# Patient Record
Sex: Female | Born: 1950 | Race: Black or African American | Hispanic: No | Marital: Single | Smoking: Current every day smoker
Health system: Southern US, Community
[De-identification: ages and names within clinical notes are randomized; demographics above are authoritative.]

## PROBLEM LIST (undated history)

## (undated) ENCOUNTER — Emergency Department (HOSPITAL_COMMUNITY): Discharge status: Absent without leave | Payer: Medicare Other

## (undated) ENCOUNTER — Emergency Department (HOSPITAL_COMMUNITY): Payer: Medicare Other | Source: Home / Self Care

## (undated) DIAGNOSIS — J189 Pneumonia, unspecified organism: Secondary | ICD-10-CM

## (undated) DIAGNOSIS — R0602 Shortness of breath: Secondary | ICD-10-CM

## (undated) DIAGNOSIS — F32A Depression, unspecified: Secondary | ICD-10-CM

## (undated) DIAGNOSIS — F419 Anxiety disorder, unspecified: Secondary | ICD-10-CM

## (undated) DIAGNOSIS — F1910 Other psychoactive substance abuse, uncomplicated: Secondary | ICD-10-CM

## (undated) DIAGNOSIS — B20 Human immunodeficiency virus [HIV] disease: Secondary | ICD-10-CM

## (undated) DIAGNOSIS — Z21 Asymptomatic human immunodeficiency virus [HIV] infection status: Secondary | ICD-10-CM

## (undated) DIAGNOSIS — Z5900 Homelessness unspecified: Secondary | ICD-10-CM

## (undated) DIAGNOSIS — K219 Gastro-esophageal reflux disease without esophagitis: Secondary | ICD-10-CM

## (undated) DIAGNOSIS — E875 Hyperkalemia: Secondary | ICD-10-CM

## (undated) DIAGNOSIS — I509 Heart failure, unspecified: Secondary | ICD-10-CM

## (undated) DIAGNOSIS — I209 Angina pectoris, unspecified: Secondary | ICD-10-CM

## (undated) DIAGNOSIS — Z992 Dependence on renal dialysis: Secondary | ICD-10-CM

## (undated) DIAGNOSIS — D649 Anemia, unspecified: Secondary | ICD-10-CM

## (undated) DIAGNOSIS — N289 Disorder of kidney and ureter, unspecified: Secondary | ICD-10-CM

## (undated) DIAGNOSIS — N186 End stage renal disease: Secondary | ICD-10-CM

## (undated) DIAGNOSIS — M1990 Unspecified osteoarthritis, unspecified site: Secondary | ICD-10-CM

## (undated) DIAGNOSIS — F329 Major depressive disorder, single episode, unspecified: Secondary | ICD-10-CM

## (undated) DIAGNOSIS — I10 Essential (primary) hypertension: Secondary | ICD-10-CM

## (undated) HISTORY — PX: LAPAROTOMY: SHX154

## (undated) HISTORY — PX: OTHER SURGICAL HISTORY: SHX169

## (undated) HISTORY — PX: BREAST BIOPSY: SHX20

## (undated) HISTORY — PX: ARTERIOVENOUS GRAFT PLACEMENT: SUR1029

## (undated) HISTORY — PX: CARDIAC CATHETERIZATION: SHX172

## (undated) HISTORY — PX: AV FISTULA PLACEMENT: SHX1204

---

## 2000-06-19 ENCOUNTER — Inpatient Hospital Stay (HOSPITAL_COMMUNITY): Admission: EM | Admit: 2000-06-19 | Discharge: 2000-06-23 | Payer: Self-pay | Admitting: *Deleted

## 2002-03-18 ENCOUNTER — Inpatient Hospital Stay (HOSPITAL_COMMUNITY): Admission: EM | Admit: 2002-03-18 | Discharge: 2002-03-22 | Payer: Self-pay | Admitting: Psychiatry

## 2002-05-27 ENCOUNTER — Other Ambulatory Visit: Admission: RE | Admit: 2002-05-27 | Discharge: 2002-05-27 | Payer: Self-pay | Admitting: Internal Medicine

## 2010-08-02 DIAGNOSIS — F1911 Other psychoactive substance abuse, in remission: Secondary | ICD-10-CM | POA: Insufficient documentation

## 2010-08-30 ENCOUNTER — Emergency Department (HOSPITAL_COMMUNITY): Admission: EM | Admit: 2010-08-30 | Discharge: 2010-08-30 | Payer: Self-pay | Admitting: Emergency Medicine

## 2010-10-30 ENCOUNTER — Encounter: Payer: Self-pay | Admitting: Nephrology

## 2011-02-24 NOTE — H&P (Signed)
[facility]  Patient:    [name] Visit Number: [id number] MRN: [id number]          Service Type: PSY Location: 500 0500 02 Attending Physician:  Rachael Fee Dictated by:   Young Berry Scott, R.N. N.P. Admit Date:  03/18/2002                     Psychiatric Admission Assessment  DATE OF ADMISSION:  March 18, 2002  DATE OF ASSESSMENT:  March 19, 2002, at 9:30 a.m.  PATIENT IDENTIFICATION:  This is a 60 year old African-American female who is a voluntary admission.  HISTORY OF PRESENT ILLNESS:  This patient presented in the emergency room depressed with suicidal thoughts and a plan to walk into traffic.  She reported that she was detoxified from alcohol here at [facility] approximately two years ago and used occasionally since that time but generally had been doing well in her life.  Then approximately two months ago, she began using alcohol daily, drinking three to four 40 ounce beers a day, up to that much in an escalating pattern, and also began using cocaine, using cocaine up to daily when she had money available.  She denies any auditory or visual hallucinations today, denies any homicidal ideation. She is drowsy and irritable and generally was uncooperative in the emergency room and is resistant to exam today.  Because the patient is resistant to interview, the bulk of this history is taken from the record.  PAST PSYCHIATRIC HISTORY:  The patient has no current psychiatric treatment. This is her second [facility] admission.  She has one prior admission in 2001 for detoxification and treatment of depression.  Her longest period clean and sober is unclear.  She has apparently been using alcohol for the past 20 years with most recent use pattern noted above, and has been using cocaine in various amounts with first use approximately 20 years ago and has been using in an escalating pattern up to $100-200 worth a  day.  SUBSTANCE ABUSE HISTORY:  Already noted above.  PAST MEDICAL HISTORY:  The patient is followed by [facility] in [facility].  Medical problems include diabetes mellitus type 2, hypertension, and history of arthritis primarily in her legs.  She does state today that she has been having unprotected sex, although how much is unclear but this is in conjunction with drug use.  MEDICATIONS: 1. Glucovance 2.5 mg/500 mg one tablet b.i.d. 2. Omeprazole 20 mg daily. 3. Lotrel 5 mg daily.  DRUG ALLERGIES:  AMOXICILLIN.  PHYSICAL EXAMINATION:  GENERAL:  The patients physical examination was done at the [facility] and is essentially unremarkable.  The patients CBG was 98 this morning.  VITAL SIGNS:  On admission to the unit, temperature 97.9, pulse 100, respirations 18, blood pressure 140/78.  LABORATORY DATA:  Urine drug screen was positive for cocaine.  BMET is remarkable for elevated chloride 110, bicarbonate 20, potassium 5.0, sodium 139, BUN 27, creatinine 1.5.  Alcohol level was less than 0.01.  CBC revealed normal WBC 8.8, RBC 3.79, hemoglobin 12.7, hematocrit 35.2, MCV 93, MCH 33.4, platelets 421, MPV 6.9.  Thyroid panel is currently pending as is her routine urinalysis.  SOCIAL HISTORY:  The patient is currently homeless and reported in the emergency room that she had lost everything because of her drug habit.  Her legal issues are unclear as are her social supports.  She does state that she has been "living from  house to house."  FAMILY HISTORY:  Unclear.  MENTAL STATUS EXAMINATION:  This is an overweight, large built, African-American female who is in bed with her eyes closed.  She is awake but says she does not want to talk, "Im too weak and sleepy."  She is resistant to interview and generally uncooperative, turns her face away from me.  Speech is normal and relevant.  Motor activity appears generally normal.  Mood is irritable.  Does seem to  have some embarrassment and shame component to her overall mood.  Thought process is logical and goal directed.  She is vaguely positive for suicidal ideation without a specific intent at this time and is able to contract for safety on the unit.  No homicidal ideation or auditory or visual hallucinations, no evidence of paranoia.  Cognitive: Intact and oriented x 3.  She is a poor historian.  Intelligence is average.  Judgment is poor.  Insight: Fair.  Impulse control: Poor.  ADMISSION DIAGNOSES: Axis I:    1. Rule out major depression, recurrent, severe.            2. Ethyl alcohol abuse, rule out dependence.            3. Cocaine abuse, rule out dependence. Axis II:   Deferred. Axis III:  1. Diabetes mellitus type 2.            2. Arthritis.            3. Rule out sexually transmitted disease. Axis IV:   Moderate to severe problems related to the social environment with            her use of drugs as a coping mechanism and severe problems with            housing with the patient being homeless. Axis V:    Current 40, past year 73.  INITIAL PLAN OF CARE:  Plan is to voluntarily admit the patient to detoxify her from alcohol and cocaine and to treat her depression symptoms.  Our goal is a safe detoxification in five days and to alleviate her suicidal ideation. We have elected to start her on a Librium protocol and will also give her Gatorade one bottle t.i.d. for the next 48 hours to get her hydrated.  We have also elected to start her on Symmetrel 100 mg p.o. b.i.d. and trazodone 50 mg h.s. p.r.n. for insomnia.  For her Axis III disorders, we will rule out sexually transmitted diseases by getting an RPR and HIV test on her along with a urine screen for GC and chlamydia and will get a urine pregnancy test, too, just to be on the safe side since we are unable to get a clear history on her menses pattern.  We will resume her routine diabetes medications and do CBGs a.c. every morning  for breakfast.  We will ask the case manager to do some further assessment as soon as the patient makes some progress with  detoxification regarding the patients family and support situation and if she has any legal issues that we need to help her with.  ESTIMATED LENGTH OF STAY:  Six days. Dictated by:   Young Berry Scott, R.N. N.P. Attending Physician:  Rachael Fee DD:  03/19/02 TD:  03/19/02 Job: [id number]

## 2011-02-24 NOTE — H&P (Signed)
[facility]  Patient:    [name], [name]                      MRN: [id number] Adm. Date:  [id number] Attending:  Otilio Saber Dictator:   Candi Leash. Orsini, N.P.                         History and Physical  ADDENDUM  REASON FOR ADMISSION AND SYMPTOMS:  She has not been sleeping well, averaging just a few hours per night and has had very little appetite for the past several days prior to admission. DD:  06/20/00 TD:  06/21/00 Job: [id number]

## 2011-02-24 NOTE — Discharge Summary (Signed)
[facility]  Patient:    [name], [name] Visit Number: [id number] MRN: [id number]          Service Type: PSY Location: 500 0500 02 Attending Physician:  Rachael Fee Dictated by:   Reymundo Poll Dub Mikes, M.D. Admit Date:  03/18/2002 Discharge Date: 03/22/2002                             Discharge Summary  CHIEFCOMPLAINT AND PRESENT ILLNESS:  This was the second admission to [facility] for this 51 year oldfemale voluntarilyadmitted.  She went to the emergency room depressed with suicidal thoughts and a plan to walk into traffic.  She was detoxified from alcohol two years prior to this admission.  Two months prior to this admission, she began using alcohol daily, drinking 3-4 40-ounce beers a day with a escalating pattern.  Started using cocaine daily when money is available.  No auditory or visual hallucinations. No homicidal ideation.  Drowsy and irritable upon initialevaluation.  PAST PSYCHIATRIC HISTORY:  No previous treatment.  No current treatment. [facility]. Longest clean period is unclear.  ALCOHOL/DRUG HISTORY:  As already stated.  Alcohol andhas gotten into using cocaine.  MEDICAL HISTORY:  Diabetes mellitus, type 2, hypertension, arthritis, also has been having unprotected sex.  MEDICATIONS:  Glucovance 2.5-500 mg, 1 twice aday, omeprazole 20 mg daily, Lotrel 5 mg daily.  PHYSICAL EXAMINATION:  Performed andfailed to show any acute findings.  MENTAL STATUS EXAMINATION:  Overweight, large-built, African-American female in bed with eyes closed.  Claimed that too weak and sleepy to talk initially. Resistant to the interview.  Uncooperative.  Speech was normal and relevant. Motor activity normal.  Mood irritable.  Claimed that she was ______________. Thought process logical and goal directed, vaguely positive for suicidal ideation.  Nointent.  Can contract for safety.  Cognition  well-preserved.  ADMISSION DIAGNOSES: Axis I:    1. Major depression, recurrent.            2. Alcohol dependence.            3. Cocaine abuse. Axis II:   No diagnosis. Axis III:  1. Diabetes mellitus, type 2.          2. Arthritis. Axis IV:   Moderate. Axis V:    Global Assessment of Functioning uponadmission 40; highest Global         Assessment ofFunctioning in the lastyear 60.  LABORATORY DATA:  Thyroid profile was within normal limits. Urine pregnancy test was negative.  RPR was nonreactive.  Chlamydia probe was negative.  HIV test was reactive, was confirmed HIV, 1 AB, confirmed positive.  HOSPITAL COURSE:  The patientwas admitted and initially was detoxed using Librium.  She was kept on her usual medications.  She started stabilizing, slept better.  On March 21, 2002, she was wanting to go home.  She stayed up until March 22, 2002.  She was wanting to be discharged.  She denied any suicidal or homicidal ideation.  Claimed that she did not want to go back to using.  She was made aware of her HIV status.  She was in full contact with reality.  She wanted to be discharged and take care of this herself through her primary physician.  Initially, a family member did not want to take her but she called some friends that wanted to take her.  As she was not suicidal or homicidal, she was fully detoxed and  in full contact with reality, she was discharged and she was recommended to contact her primary physician first thing.  She was given copies of the labs for follow-up of the tests.  DISCHARGE DIAGNOSES: Axis I:    1. Major depression, recurrent.       2. Alcohol dependence.            3. Cocaineabuse. Axis II:   No diagnosis. Axis III:  1.Diabetes mellitus, type2.            2. Arthritis. Axis IV:   Moderate. Axis V:    Global Assessment of Functioning upon discharge 55.  DISCHARGE MEDICATIONS: 1. Glucovance 2.5-500 mg, 1 twice a day. 2. Lotrel 5-20 mg, 1 daily. 3. Prilosec 20  mg daily. 4. Clarinex 5 mg daily. 5. Zoloft 50 mgdaily.  FOLLOW-UP: Primary physician. Dictated by:   Reymundo Poll Dub Mikes, M.D. Attending Physician:  Rachael Fee DD:  04/30/02 TD:  04/30/02 Job: [id number]

## 2011-02-24 NOTE — Discharge Summary (Signed)
[facility]  Patient:    [name], [name]                      MRN: [id number] Adm. Date:  [id number] Disc. Date: [id number] Attending:  Otilio Saber Dictator:   Landry Corporal, NP                           Discharge Summary  HISTORY OF PRESENT ILLNESS:  [name] is a 60 year old black female that was voluntarily admitted to [facility] for alcohol rehabilitation and cocaine abuse.  She had gone to [facility] voluntarily wanting to get off the street drugs and was also having thoughts of suicide.  She also had not been sleep well, averaging just a few hours per night and has had very little appetite for the past several days prior to admission.  She has a history of alcohol abuse and smoking cocaine for approximately 10 years.  She has had no previous psychiatric hospitalizations, no prior rehab or detox.  PAST MEDICAL HISTORY:  Her primary care physician is Alberteen Spindle, for her diabetes mellitus, hypertension and arthritis. Her admission medications will include Glucotrol 5 mg b.i.d. p.o.  PHYSICAL EXAMINATION:  Physical examination was done at [facility] with significant findings of blood pressure of 132/104, blood sugars of 388 with positive cocaine in the urine drug screen.  MENTAL STATUS EXAMINATION:  Patient was sleeping in a bed upon interview, somewhat lethargic.  She falls asleep when she is not disturbed.  Her appearance and behavior:  She is in bed with hospital garb.  She is cooperative and is dressed in [employer].  Her speech is normal and relevant, somewhat slow.  Her mood is depressed, her affect is depressed.  She has little eye contact.  Her thought processes are logical.  No evidence of psychosis, no hallucinations, no delusions, no flight of ideas.  Her reliability is uncertain.  No suicidal ideation today, although she was having thoughts of suicidal on September 11.  Cognitively, she is oriented x  3.  Her judgment is poor.  Insight is fair.  Average Intelligence, with decreased concentration.  She is also a poor historian.  ADMITTING DIAGNOSES: Axis I:     1. Major depression.             2. Substance abuse for alcohol and cocaine. Axis II:    None. Axis III:   1. Non-insulin-dependent diabetes mellitus.             2. Hypertension.             3. Arthritis in her right leg. Axis IV:    Severe, related to problems with primary support group, social             environment, housing problems, legal system, and medical problems. Axis V:     Current global assessment of function 40, highest past year 45. DD:  07/12/00 TD:  07/12/00 Job: [id number]

## 2011-02-24 NOTE — H&P (Signed)
[facility]  Patient:    [name], [name]                      MRN: [id number] Adm. Date:  [id number] Attending:  Otilio Saber Dictator:   Candi Leash. Orsini, N.P.                         History and Physical  IDENTIFYING INFORMATION:  This is a 60 year old black female voluntarily admitted to [facility] for alcohol rehabilitation and cocaine abuse. She had gone to [facility] voluntarily wanting to get off these street drugs.  She was having thoughts of suicide yesterday., somewhat vague about what she wanted to do but she did say at one time she felt like she wanted to step in front of a car and was then transferred to [facility] for admission.  She has a history of alcohol abuse and cocaine smoking for approximately 10 years.  PAST PSYCHIATRIC HISTORY:  She has had no previous psychiatric hospitalization, no prior rehabilitation or detox and has not seen a psychiatrist or a therapist for any kind of psychiatric problems.  SOCIAL HISTORY:  She is a 60 year old divorced.  Her ex-husband just passed away.  She has five grown children.  [place]  She is on disability.  She completed the ninth grade.  She is a nonsmoker.  Her sister, Milagro Belmares is supportive of her.  She is on probation for a DUI.  FAMILY HISTORY:  Sister and niece have reports of suicide attempts.  ALCOHOL AND DRUG HISTORY:  She states she has been drinking about a fifth a day of alcohol for quite a long time.  Again, her time frame is vague.  She has been smoking cocaine for about 10 years.  She reports about a $1000 a week habit and says that people just bring this to her on sort of a routine basis.  PAST MEDICAL HISTORY:  Her primary care Dyneisha Murchison is Dr. Oneita Hurt in the [facility] in [place].  Her medical problems include non-insulin-dependent diabetes mellitus for 10 years, hypertension, arthritis is her right leg.  She says her blood  sugars have been running around 300 for quite a long time.  MEDICATIONS:  Again, she is somewhat unsure, but she is is on Glucotrol 10 mg q.d. and a blood pressure pill.  She was unable to tell me what that medication was.  ALLERGIES:  No known drug allergies.  POSITIVE PHYSICAL FINDINGS:  Physical examination was done at [facility].  Physical findings include a blood pressure of 132/104, blood sugar of 388 with positive cocaine in her urine drug screen.  MENTAL STATUS EXAMINATION:  She was sleeping in bed upon interview, somewhat lethargic.  She falls asleep like she is not disturbed.  Her appearance and behavior - She is in bed.  She is cooperative and she is dressed in hospital garb.  Her speech is normal and relevant, somewhat slow.  Her mood is depressed.  Her affect is depressed.  She has little eye contact.  Her thought processes are logical, no evidence of psychosis, no hallucinations, no delusions, no flight of ideas.  Her reliability is uncertain.  No suicidal ideation today, although she was kind of planning that on June 19, 2000.  Cognitive, she is oriented x 3.  Her judgment is poor insight is fair, average intelligence and decreased concentration.  She is also a poor  historian.  DIAGNOSES: Axis I:    1. Major depression.            2. Substance abuse for alcohol and cocaine. Axis II:   None. Axis III:  1. Non-insulin-dependent diabetes mellitus.            2. Hypertension.            3. Arthritis in her right leg. Axis IV:   Severe in regard to her problems with primary support group, social            environment, housing problems, legal system and medical problems.  PLAN:  Voluntary admission to [facility] for alcohol rehabilitation and cocaine abuse.  We will obtain her records from [facility] in [place].  We will monitor her blood sugars and place her on insulin sensitive sliding scale.  MEDICATIONS:  Glucotrol 5 mg p.o.  b.i.d., Librium 25 mg p.o. q.4h. p.r.n., thiamine 100 mg IM x 1 today, thiamine 100 mg p.o. q.d.  Will add a multivitamin 1 p.o. q.d. and the insulin sensitive sliding scale - routine orders.  TENTATIVE LENGTH OF STAY AND DISCHARGE PLANS:  3-5 days.  DD:  06/20/00 TD:  06/20/00 Job: [id number]

## 2012-03-12 ENCOUNTER — Emergency Department: Payer: Self-pay | Admitting: Emergency Medicine

## 2012-03-12 LAB — BASIC METABOLIC PANEL
Anion Gap: 12 (ref 7–16)
Co2: 21 mmol/L (ref 21–32)
Creatinine: 11.38 mg/dL — ABNORMAL HIGH (ref 0.60–1.30)
EGFR (African American): 4 — ABNORMAL LOW
EGFR (Non-African Amer.): 3 — ABNORMAL LOW
Glucose: 77 mg/dL (ref 65–99)
Osmolality: 297 (ref 275–301)
Potassium: 5.6 mmol/L — ABNORMAL HIGH (ref 3.5–5.1)
Sodium: 139 mmol/L (ref 136–145)

## 2012-03-13 ENCOUNTER — Inpatient Hospital Stay: Payer: Self-pay | Admitting: Internal Medicine

## 2012-03-13 LAB — TSH: Thyroid Stimulating Horm: 0.41 u[IU]/mL — ABNORMAL LOW

## 2012-03-13 LAB — COMPREHENSIVE METABOLIC PANEL
BUN: 80 mg/dL — ABNORMAL HIGH (ref 7–18)
Bilirubin,Total: 0.4 mg/dL (ref 0.2–1.0)
EGFR (African American): 3 — ABNORMAL LOW
Glucose: 132 mg/dL — ABNORMAL HIGH (ref 65–99)
Osmolality: 305 (ref 275–301)
SGPT (ALT): 60 U/L
Sodium: 140 mmol/L (ref 136–145)
Total Protein: 7.9 g/dL (ref 6.4–8.2)

## 2012-03-13 LAB — URINALYSIS, COMPLETE
Blood: NEGATIVE
Glucose,UR: 150 mg/dL (ref 0–75)
Ph: 9 (ref 4.5–8.0)
RBC,UR: NONE SEEN /HPF (ref 0–5)

## 2012-03-13 LAB — DRUG SCREEN, URINE
Barbiturates, Ur Screen: NEGATIVE (ref ?–200)
Cocaine Metabolite,Ur ~~LOC~~: POSITIVE (ref ?–300)
MDMA (Ecstasy)Ur Screen: NEGATIVE (ref ?–500)
Tricyclic, Ur Screen: NEGATIVE (ref ?–1000)

## 2012-03-13 LAB — CK TOTAL AND CKMB (NOT AT ARMC)
CK, Total: 104 U/L (ref 21–215)
CK-MB: 0.9 ng/mL (ref 0.5–3.6)

## 2012-03-13 LAB — CBC
HGB: 11 g/dL — ABNORMAL LOW (ref 12.0–16.0)
MCV: 106 fL — ABNORMAL HIGH (ref 80–100)
Platelet: 226 10*3/uL (ref 150–440)
RBC: 3.2 10*6/uL — ABNORMAL LOW (ref 3.80–5.20)
RDW: 16.4 % — ABNORMAL HIGH (ref 11.5–14.5)
WBC: 8.4 10*3/uL (ref 3.6–11.0)

## 2012-03-13 LAB — PRO B NATRIURETIC PEPTIDE: B-Type Natriuretic Peptide: 35626 pg/mL — ABNORMAL HIGH (ref 0–125)

## 2012-03-14 DIAGNOSIS — I059 Rheumatic mitral valve disease, unspecified: Secondary | ICD-10-CM

## 2012-03-14 LAB — COMPREHENSIVE METABOLIC PANEL
Albumin: 3.4 g/dL (ref 3.4–5.0)
Anion Gap: 15 (ref 7–16)
BUN: 88 mg/dL — ABNORMAL HIGH (ref 7–18)
Bilirubin,Total: 0.5 mg/dL (ref 0.2–1.0)
Chloride: 105 mmol/L (ref 98–107)
Co2: 17 mmol/L — ABNORMAL LOW (ref 21–32)
EGFR (African American): 3 — ABNORMAL LOW
EGFR (Non-African Amer.): 3 — ABNORMAL LOW
Glucose: 140 mg/dL — ABNORMAL HIGH (ref 65–99)
Osmolality: 303 (ref 275–301)
Potassium: 5.4 mmol/L — ABNORMAL HIGH (ref 3.5–5.1)
Sodium: 137 mmol/L (ref 136–145)
Total Protein: 7.5 g/dL (ref 6.4–8.2)

## 2012-03-14 LAB — T4, FREE: Free Thyroxine: 0.88 ng/dL (ref 0.76–1.46)

## 2012-03-14 LAB — CBC WITH DIFFERENTIAL/PLATELET
Basophil #: 0.1 10*3/uL (ref 0.0–0.1)
Basophil %: 1 %
Eosinophil #: 0.1 10*3/uL (ref 0.0–0.7)
HGB: 9.9 g/dL — ABNORMAL LOW (ref 12.0–16.0)
Lymphocyte #: 2.7 10*3/uL (ref 1.0–3.6)
Monocyte %: 11.1 %
Neutrophil #: 3.7 10*3/uL (ref 1.4–6.5)
Neutrophil %: 49.7 %
Platelet: 186 10*3/uL (ref 150–440)
RBC: 2.85 10*6/uL — ABNORMAL LOW (ref 3.80–5.20)
WBC: 7.4 10*3/uL (ref 3.6–11.0)

## 2012-03-14 LAB — RENAL FUNCTION PANEL
Albumin: 3.3 g/dL — ABNORMAL LOW (ref 3.4–5.0)
Calcium, Total: 8.8 mg/dL (ref 8.5–10.1)
Chloride: 105 mmol/L (ref 98–107)
EGFR (African American): 3 — ABNORMAL LOW
Glucose: 141 mg/dL — ABNORMAL HIGH (ref 65–99)
Potassium: 5.4 mmol/L — ABNORMAL HIGH (ref 3.5–5.1)

## 2012-03-14 LAB — TROPONIN I: Troponin-I: <0.02 ng/mL

## 2012-03-14 LAB — LIPID PANEL
Cholesterol: 136 mg/dL (ref 0–200)
HDL Cholesterol: 36 mg/dL — ABNORMAL LOW (ref 40–60)
Triglycerides: 159 mg/dL (ref 0–200)
VLDL Cholesterol, Calc: 32 mg/dL (ref 5–40)

## 2012-03-14 LAB — FOLATE: Folic Acid: 16.1 ng/mL (ref 3.1–100.0)

## 2012-03-14 LAB — CK TOTAL AND CKMB (NOT AT ARMC)
CK, Total: 90 U/L (ref 21–215)
CK-MB: 0.9 ng/mL (ref 0.5–3.6)

## 2012-03-14 LAB — MAGNESIUM: Magnesium: 2.2 mg/dL

## 2012-03-15 LAB — HEMOGLOBIN A1C: Hemoglobin A1C: 5 % (ref 4.2–6.3)

## 2012-05-06 ENCOUNTER — Inpatient Hospital Stay: Payer: Self-pay | Admitting: Internal Medicine

## 2012-05-06 LAB — CBC
HGB: 9.9 g/dL — ABNORMAL LOW (ref 12.0–16.0)
MCH: 35.9 pg — ABNORMAL HIGH (ref 26.0–34.0)
MCV: 108 fL — ABNORMAL HIGH (ref 80–100)
Platelet: 235 10*3/uL (ref 150–440)
RBC: 2.77 10*6/uL — ABNORMAL LOW (ref 3.80–5.20)
RDW: 17 % — ABNORMAL HIGH (ref 11.5–14.5)
WBC: 9.4 10*3/uL (ref 3.6–11.0)

## 2012-05-06 LAB — COMPREHENSIVE METABOLIC PANEL
Alkaline Phosphatase: 420 U/L — ABNORMAL HIGH (ref 50–136)
Anion Gap: 16 (ref 7–16)
BUN: 134 mg/dL — ABNORMAL HIGH (ref 7–18)
Calcium, Total: 6.7 mg/dL — CL (ref 8.5–10.1)
Chloride: 105 mmol/L (ref 98–107)
Co2: 15 mmol/L — ABNORMAL LOW (ref 21–32)
EGFR (African American): 3 — ABNORMAL LOW
EGFR (Non-African Amer.): 3 — ABNORMAL LOW
Glucose: 86 mg/dL (ref 65–99)
Potassium: 6.6 mmol/L (ref 3.5–5.1)
SGOT(AST): 119 U/L — ABNORMAL HIGH (ref 15–37)
SGPT (ALT): 72 U/L
Total Protein: 7.6 g/dL (ref 6.4–8.2)

## 2012-05-06 LAB — CK TOTAL AND CKMB (NOT AT ARMC): CK-MB: 1.6 ng/mL (ref 0.5–3.6)

## 2012-05-06 LAB — PHOSPHORUS: Phosphorus: 6.2 mg/dL — ABNORMAL HIGH (ref 2.5–4.9)

## 2012-05-06 LAB — BASIC METABOLIC PANEL
Calcium, Total: 7.2 mg/dL — ABNORMAL LOW (ref 8.5–10.1)
Chloride: 105 mmol/L (ref 98–107)
Osmolality: 317 (ref 275–301)
Potassium: 5 mmol/L (ref 3.5–5.1)
Sodium: 137 mmol/L (ref 136–145)

## 2012-05-06 LAB — TROPONIN I: Troponin-I: <0.02 ng/mL

## 2012-05-10 ENCOUNTER — Encounter (HOSPITAL_COMMUNITY): Payer: Self-pay | Admitting: Emergency Medicine

## 2012-05-10 ENCOUNTER — Observation Stay (HOSPITAL_COMMUNITY)
Admission: EM | Admit: 2012-05-10 | Discharge: 2012-05-14 | Disposition: A | Discharge status: Home / Self Care | Payer: Medicare Other | Attending: Nephrology | Admitting: Nephrology

## 2012-05-10 ENCOUNTER — Inpatient Hospital Stay (HOSPITAL_COMMUNITY): Payer: Medicare Other

## 2012-05-10 DIAGNOSIS — F17200 Nicotine dependence, unspecified, uncomplicated: Secondary | ICD-10-CM | POA: Insufficient documentation

## 2012-05-10 DIAGNOSIS — F1210 Cannabis abuse, uncomplicated: Secondary | ICD-10-CM | POA: Insufficient documentation

## 2012-05-10 DIAGNOSIS — Z992 Dependence on renal dialysis: Secondary | ICD-10-CM | POA: Diagnosis present

## 2012-05-10 DIAGNOSIS — I509 Heart failure, unspecified: Secondary | ICD-10-CM | POA: Insufficient documentation

## 2012-05-10 DIAGNOSIS — Z79899 Other long term (current) drug therapy: Secondary | ICD-10-CM | POA: Insufficient documentation

## 2012-05-10 DIAGNOSIS — D631 Anemia in chronic kidney disease: Secondary | ICD-10-CM | POA: Insufficient documentation

## 2012-05-10 DIAGNOSIS — N189 Chronic kidney disease, unspecified: Secondary | ICD-10-CM | POA: Insufficient documentation

## 2012-05-10 DIAGNOSIS — E119 Type 2 diabetes mellitus without complications: Secondary | ICD-10-CM | POA: Insufficient documentation

## 2012-05-10 DIAGNOSIS — Z9119 Patient's noncompliance with other medical treatment and regimen: Secondary | ICD-10-CM | POA: Insufficient documentation

## 2012-05-10 DIAGNOSIS — N2581 Secondary hyperparathyroidism of renal origin: Secondary | ICD-10-CM | POA: Insufficient documentation

## 2012-05-10 DIAGNOSIS — B20 Human immunodeficiency virus [HIV] disease: Secondary | ICD-10-CM | POA: Diagnosis present

## 2012-05-10 DIAGNOSIS — F1410 Cocaine abuse, uncomplicated: Secondary | ICD-10-CM | POA: Insufficient documentation

## 2012-05-10 DIAGNOSIS — Z720 Tobacco use: Secondary | ICD-10-CM | POA: Diagnosis present

## 2012-05-10 DIAGNOSIS — I120 Hypertensive chronic kidney disease with stage 5 chronic kidney disease or end stage renal disease: Secondary | ICD-10-CM | POA: Insufficient documentation

## 2012-05-10 DIAGNOSIS — Z91199 Patient's noncompliance with other medical treatment and regimen due to unspecified reason: Secondary | ICD-10-CM | POA: Insufficient documentation

## 2012-05-10 DIAGNOSIS — Z21 Asymptomatic human immunodeficiency virus [HIV] infection status: Secondary | ICD-10-CM | POA: Insufficient documentation

## 2012-05-10 DIAGNOSIS — M25569 Pain in unspecified knee: Secondary | ICD-10-CM | POA: Insufficient documentation

## 2012-05-10 DIAGNOSIS — I10 Essential (primary) hypertension: Secondary | ICD-10-CM | POA: Diagnosis present

## 2012-05-10 DIAGNOSIS — D649 Anemia, unspecified: Secondary | ICD-10-CM

## 2012-05-10 DIAGNOSIS — N186 End stage renal disease: Secondary | ICD-10-CM | POA: Insufficient documentation

## 2012-05-10 HISTORY — DX: Human immunodeficiency virus (HIV) disease: B20

## 2012-05-10 HISTORY — DX: Asymptomatic human immunodeficiency virus (hiv) infection status: Z21

## 2012-05-10 HISTORY — DX: Heart failure, unspecified: I50.9

## 2012-05-10 HISTORY — DX: Essential (primary) hypertension: I10

## 2012-05-10 LAB — COMPREHENSIVE METABOLIC PANEL
AST: 16 U/L (ref 0–37)
CO2: 23 mEq/L (ref 19–32)
GFR calc Af Amer: 4 mL/min — ABNORMAL LOW (ref >90)
GFR calc non Af Amer: 3 mL/min — ABNORMAL LOW (ref >90)
Glucose, Bld: 91 mg/dL (ref 70–99)
Potassium: 4 mEq/L (ref 3.5–5.1)
Total Protein: 7.5 g/dL (ref 6.0–8.3)

## 2012-05-10 LAB — CBC WITH DIFFERENTIAL/PLATELET
Basophils Relative: 0 % (ref 0–1)
Hemoglobin: 9.5 g/dL — ABNORMAL LOW (ref 12.0–15.0)
Lymphocytes Relative: 39 % (ref 12–46)
MCH: 34.2 pg — ABNORMAL HIGH (ref 26.0–34.0)
Monocytes Absolute: 0.7 10*3/uL (ref 0.1–1.0)
Monocytes Relative: 8 % (ref 3–12)
Neutro Abs: 4.7 10*3/uL (ref 1.7–7.7)
Neutrophils Relative %: 52 % (ref 43–77)
RDW: 15.6 % — ABNORMAL HIGH (ref 11.5–15.5)

## 2012-05-10 MED ORDER — PANTOPRAZOLE SODIUM 40 MG PO TBEC
40.0000 mg | DELAYED_RELEASE_TABLET | Freq: Every day | ORAL | Status: DC
  Administered 2012-05-11 – 2012-05-13 (×3): 40 mg via ORAL
  Filled 2012-05-10 (×3): qty 1

## 2012-05-10 MED ORDER — LAMIVUDINE 150 MG PO TABS
150.0000 mg | ORAL_TABLET | Freq: Every day | ORAL | Status: DC
  Administered 2012-05-11 – 2012-05-12 (×2): 150 mg via ORAL
  Filled 2012-05-10 (×3): qty 1

## 2012-05-10 MED ORDER — SODIUM CHLORIDE 0.9 % IV SOLN
62.5000 mg | INTRAVENOUS | Status: DC
  Filled 2012-05-10: qty 5

## 2012-05-10 MED ORDER — CARVEDILOL 6.25 MG PO TABS
6.2500 mg | ORAL_TABLET | Freq: Two times a day (BID) | ORAL | Status: DC
  Administered 2012-05-11 – 2012-05-13 (×5): 6.25 mg via ORAL
  Filled 2012-05-10 (×10): qty 1

## 2012-05-10 MED ORDER — ACETAMINOPHEN 500 MG PO TABS
1000.0000 mg | ORAL_TABLET | Freq: Four times a day (QID) | ORAL | Status: DC | PRN
  Administered 2012-05-10 – 2012-05-11 (×2): 1000 mg via ORAL
  Filled 2012-05-10 (×3): qty 2

## 2012-05-10 MED ORDER — HYDRALAZINE HCL 50 MG PO TABS
100.0000 mg | ORAL_TABLET | Freq: Three times a day (TID) | ORAL | Status: DC
  Administered 2012-05-10 – 2012-05-12 (×4): 100 mg via ORAL
  Filled 2012-05-10 (×7): qty 2

## 2012-05-10 MED ORDER — SODIUM BICARBONATE 650 MG PO TABS
1300.0000 mg | ORAL_TABLET | Freq: Two times a day (BID) | ORAL | Status: DC
  Administered 2012-05-10 – 2012-05-12 (×4): 1300 mg via ORAL
  Filled 2012-05-10 (×6): qty 2

## 2012-05-10 MED ORDER — LORAZEPAM 1 MG PO TABS
1.0000 mg | ORAL_TABLET | Freq: Once | ORAL | Status: AC
  Administered 2012-05-10: 1 mg via ORAL
  Filled 2012-05-10: qty 1

## 2012-05-10 MED ORDER — ONDANSETRON HCL 4 MG/2ML IJ SOLN: 4.0000 mg | Freq: Four times a day (QID) | INTRAMUSCULAR | Status: DC | PRN

## 2012-05-10 MED ORDER — ONDANSETRON HCL 4 MG PO TABS: 4.0000 mg | ORAL_TABLET | Freq: Four times a day (QID) | ORAL | Status: DC | PRN

## 2012-05-10 MED ORDER — AMLODIPINE BESYLATE 10 MG PO TABS
10.0000 mg | ORAL_TABLET | Freq: Every day | ORAL | Status: DC
  Administered 2012-05-11 – 2012-05-12 (×2): 10 mg via ORAL
  Filled 2012-05-10 (×2): qty 1

## 2012-05-10 MED ORDER — AMITRIPTYLINE HCL 10 MG PO TABS
20.0000 mg | ORAL_TABLET | Freq: Every day | ORAL | Status: DC
  Administered 2012-05-11 – 2012-05-13 (×4): 20 mg via ORAL
  Filled 2012-05-10 (×7): qty 2

## 2012-05-10 MED ORDER — FUROSEMIDE 40 MG PO TABS
40.0000 mg | ORAL_TABLET | Freq: Two times a day (BID) | ORAL | Status: DC
  Administered 2012-05-11 – 2012-05-12 (×3): 40 mg via ORAL
  Filled 2012-05-10 (×5): qty 1

## 2012-05-10 MED ORDER — DARBEPOETIN ALFA-POLYSORBATE 60 MCG/0.3ML IJ SOLN
60.0000 ug | INTRAMUSCULAR | Status: DC
  Administered 2012-05-11: 60 ug via INTRAVENOUS
  Filled 2012-05-10: qty 0.3

## 2012-05-10 MED ORDER — RALTEGRAVIR POTASSIUM 400 MG PO TABS
400.0000 mg | ORAL_TABLET | Freq: Two times a day (BID) | ORAL | Status: DC
  Administered 2012-05-10 – 2012-05-13 (×7): 400 mg via ORAL
  Filled 2012-05-10 (×9): qty 1

## 2012-05-10 MED ORDER — PARICALCITOL 5 MCG/ML IV SOLN
3.0000 ug | INTRAVENOUS | Status: DC
  Administered 2012-05-11: 3 ug via INTRAVENOUS
  Filled 2012-05-10 (×2): qty 0.6

## 2012-05-10 MED ORDER — CITALOPRAM HYDROBROMIDE 20 MG PO TABS
20.0000 mg | ORAL_TABLET | Freq: Every day | ORAL | Status: DC
  Administered 2012-05-11 – 2012-05-13 (×3): 20 mg via ORAL
  Filled 2012-05-10 (×4): qty 1

## 2012-05-10 MED ORDER — TENOFOVIR DISOPROXIL FUMARATE 300 MG PO TABS: 300.0000 mg | ORAL_TABLET | ORAL | Status: DC

## 2012-05-10 MED ORDER — HEPARIN SODIUM (PORCINE) 5000 UNIT/ML IJ SOLN
5000.0000 [IU] | Freq: Three times a day (TID) | INTRAMUSCULAR | Status: DC
  Administered 2012-05-10 – 2012-05-13 (×10): 5000 [IU] via SUBCUTANEOUS
  Filled 2012-05-10 (×14): qty 1

## 2012-05-10 MED ORDER — ALUM & MAG HYDROXIDE-SIMETH 200-200-20 MG/5ML PO SUSP
30.0000 mL | Freq: Four times a day (QID) | ORAL | Status: DC | PRN
  Filled 2012-05-10: qty 30

## 2012-05-10 MED ORDER — CINACALCET HCL 30 MG PO TABS
60.0000 mg | ORAL_TABLET | Freq: Two times a day (BID) | ORAL | Status: DC
  Administered 2012-05-11 – 2012-05-13 (×5): 60 mg via ORAL
  Filled 2012-05-10 (×9): qty 2

## 2012-05-10 MED ORDER — ACETAMINOPHEN 650 MG RE SUPP: 650.0000 mg | Freq: Four times a day (QID) | RECTAL | Status: DC | PRN

## 2012-05-10 MED ORDER — RENA-VITE PO TABS
1.0000 | ORAL_TABLET | Freq: Every day | ORAL | Status: DC
  Administered 2012-05-10 – 2012-05-13 (×4): 1 via ORAL
  Filled 2012-05-10 (×5): qty 1

## 2012-05-10 MED ORDER — SEVELAMER CARBONATE 800 MG PO TABS
2400.0000 mg | ORAL_TABLET | Freq: Three times a day (TID) | ORAL | Status: DC
  Administered 2012-05-11 – 2012-05-13 (×7): 2400 mg via ORAL
  Filled 2012-05-10 (×13): qty 3

## 2012-05-10 NOTE — Procedures (Signed)
I was present at this dialysis session. I have reviewed the session itself and made appropriate changes.   Vinson Moselle, MD [employer] 05/10/2012, 5:48 PM

## 2012-05-10 NOTE — Consult Note (Signed)
[facility] Renal Consultation Note  Indication for Consultation:  Management of ESRD/hemodialysis; anemia, hypertension/volume and secondary hyperparathyroidism  HPI: [name] is a 61 y.o. female on Hemodialysis Tues , thrus, sat with last treatment on Tuesday 05/07/12  At [facility] . She decided to move to [place] area to "live with Friend there, my HIV social worker took me to [facility] for dialysis."  There were no plans to be accepted or start at the [place] unit. I called her home unit [facility] in [facility] for history and was told by the charge RN  "  Patient has history of noncompliance  With attending hd and staying her full time ",they are not aware of any plans for her to move. "Recently her husband died from terminal Cancer in Prison and she missed several treatments and was admitted to [facility] for Pulmonary edema, "  Now in ER denying sob or cp/ saying '"I need some dialysis. " She gives history of staring hd in [place] area 2 years ago because of hypertension related to  cocaine use." Moved to [facility] unit past 2 months to get away from from drug/ cocaine people in [place]."  Followed by [facility] ID clinic/ past Monday ov visit per  Patient "had good viral numbers ."      Past Medical History  Diagnosis Date  . Hypertension   . HIV infection   . Chronic renal failure   . CHF (congestive heart failure)   . ESRD (end stage renal disease)    Surgical history= left forearm avf  Social History= Widowed, 5 Children  She does not keep in contact with "because of their Drug use", smoking since 18 yrs. Old, .5 pack per day , stopped etoh and drugs cocaine  1 year ago." [facility] [place] [facility]hab."       Allergies  Allergen Reactions  . Morphine And Related     Prior to Admission medications   Medication Sig Start Date End Date Taking? Authorizing Provider  acetaminophen (TYLENOL) 500 MG tablet Take  1,000 mg by mouth every 6 (six) hours as needed. pain   Yes Historical Provider, MD  amitriptyline (ELAVIL) 10 MG tablet Take 20 mg by mouth at bedtime.   Yes Historical Provider, MD  amLODipine (NORVASC) 10 MG tablet Take 10 mg by mouth daily.   Yes Historical Provider, MD  carvedilol (COREG) 6.25 MG tablet Take 6.25 mg by mouth 2 (two) times daily with a meal.   Yes Historical Provider, MD  cinacalcet (SENSIPAR) 60 MG tablet Take 60 mg by mouth 2 (two) times daily.   Yes Historical Provider, MD  citalopram (CELEXA) 20 MG tablet Take 20 mg by mouth daily.   Yes Historical Provider, MD  furosemide (LASIX) 40 MG tablet Take 40 mg by mouth 2 (two) times daily.   Yes Historical Provider, MD  hydrALAZINE (APRESOLINE) 100 MG tablet Take 100 mg by mouth 3 (three) times daily.   Yes Historical Provider, MD  lamiVUDine (EPIVIR) 150 MG tablet Take 150 mg by mouth daily.   Yes Historical Provider, MD  lidocaine-prilocaine (EMLA) cream Apply 1 application topically as needed.   Yes Historical Provider, MD  multivitamin (RENA-VIT) TABS tablet Take 1 tablet by mouth daily.   Yes Historical Provider, MD  Nutritional Supplements (MELATONIN PO) Take 1 tablet by mouth at bedtime. 3 mg   Yes Historical Provider, MD  omeprazole (PRILOSEC) 20 MG capsule Take 20  mg by mouth daily.   Yes Historical Provider, MD  raltegravir (ISENTRESS) 400 MG tablet Take 400 mg by mouth 2 (two) times daily.   Yes Historical Provider, MD  sevelamer (RENVELA) 800 MG tablet Take 2,400 mg by mouth 3 (three) times daily with meals. 2400 mg with meals, 800 mg with snacks   Yes Historical Provider, MD  sodium bicarbonate 650 MG tablet Take 1,300 mg by mouth 2 (two) times daily.   Yes Historical Provider, MD  tenofovir (VIREAD) 300 MG tablet Take 300 mg by mouth once a week.   Yes Historical Provider, MD       ROS: no positives    Physical Exam: Filed Vitals:   05/10/12 1443  BP: 141/72  Pulse: 75  Temp: 98.2 F (36.8 C)  Resp: 16      General: Alert bf nad, Pleasant HEENT: [place], MMM Eyes: eomi Neck:  No jvd Heart:  RRR, norub or murmur Lungs: cta Abdomen:  Obese, soft, nontender Extremities:  Trace bipedal edema Skin: warm dry  Neuro: ox3 , moves all extremities Dialysis Access:  Positive bruit left fa avf  Dialysis Orders: Center: [facility]  on tthsat . EDW 99.5 kg HD Bath 2k ,2.5 ca  Time 3.5 hrs Heparin standard. Access left fa avf BFR 450  DFR  600    Zemplar 3 mcg IV/HD Epogen 4400   Units IV/HD  Venofer  50mg  per week  Other 0  Assessment/Plan 1. Noncompliance  With HD treatments 2. ESRD -  Hd today then in am to keep on schedule 3. Hypertension/volume  - bp 143/70 use meds and a attempt 3 to 4.5 liters uf on hd 4. Anemia  - hgb 9.5 epo and venofer on hd 5. Metabolic bone disease -  zemplar on hd/ ca =6.8 check phos. Renvela binders 6. HiV= meds per admit  Lenny Pastel, PA-C [facility] [phone] 05/10/2012, 4:28 PM   Patient seen and examined and agree with assessment and plan as above.  Vinson Moselle  MD [employer] [phone] pgr    [phone] cell 05/10/2012, 5:48 PM

## 2012-05-10 NOTE — ED Provider Notes (Signed)
History  This chart was scribed for [employer]. Rubin Payor, MD by Erskine Emery. This patient was seen in room TR08C/TR08C and the patient's care was started at 13:12.   CSN: [id number]  Arrival date & time 05/10/12  1228   First MD Initiated Contact with Patient 05/10/12 1312     Caveat: Poor Historian  Chief Complaint  Patient presents with  . Vascular Access Problem    (Consider location/radiation/quality/duration/timing/severity/associated sxs/prior treatment) HPI [name] is a 61 y.o. female who presents to the Emergency Department for routine dialysis. Pt reports she moved here yesterday from [facility]. Pt reports she knows she needs dialysis, she hasn't had it since last Tuesday. [facility] is aware of the pt. Pt requested that I talk to her social worker for a better history.   Past Medical History  Diagnosis Date  . Hypertension   . HIV infection   . Chronic renal failure   . CHF (congestive heart failure)     History reviewed. No pertinent past surgical history.  History reviewed. No pertinent family history.  History  Substance Use Topics  . Smoking status: Current Everyday Smoker  . Smokeless tobacco: Not on file  . Alcohol Use: Yes    OB History    Grav Para Term Preterm Abortions TAB SAB Ect Mult Living                  Review of Systems  Unable to perform ROS: Other  Constitutional:       In need of dialysis    Allergies  Morphine and related  Home Medications   Current Outpatient Rx  Name Route Sig Dispense Refill  . ACETAMINOPHEN 500 MG PO TABS Oral Take 1,000 mg by mouth every 6 (six) hours as needed. pain    . AMITRIPTYLINE HCL 10 MG PO TABS Oral Take 20 mg by mouth at bedtime.    Marland Kitchen AMLODIPINE BESYLATE 10 MG PO TABS Oral Take 10 mg by mouth daily.    Marland Kitchen CARVEDILOL 6.25 MG PO TABS Oral Take 6.25 mg by mouth 2 (two) times daily with a meal.    . CINACALCET HCL 60 MG PO TABS Oral Take 60 mg by mouth 2 (two) times daily.    Marland Kitchen  CITALOPRAM HYDROBROMIDE 20 MG PO TABS Oral Take 20 mg by mouth daily.    . FUROSEMIDE 40 MG PO TABS Oral Take 40 mg by mouth 2 (two) times daily.    Marland Kitchen HYDRALAZINE HCL 100 MG PO TABS Oral Take 100 mg by mouth 3 (three) times daily.    Marland Kitchen LAMIVUDINE 150 MG PO TABS Oral Take 150 mg by mouth daily.    Marland Kitchen LIDOCAINE-PRILOCAINE 2.5-2.5 % EX CREA Topical Apply 1 application topically as needed.    Marland Kitchen RENA-VITE PO TABS Oral Take 1 tablet by mouth daily.    Marland Kitchen MELATONIN PO Oral Take 1 tablet by mouth at bedtime. 3 mg    . OMEPRAZOLE 20 MG PO CPDR Oral Take 20 mg by mouth daily.    Marland Kitchen RALTEGRAVIR POTASSIUM 400 MG PO TABS Oral Take 400 mg by mouth 2 (two) times daily.    Marland Kitchen SEVELAMER CARBONATE 800 MG PO TABS Oral Take 2,400 mg by mouth 3 (three) times daily with meals. 2400 mg with meals, 800 mg with snacks    . SODIUM BICARBONATE 650 MG PO TABS Oral Take 1,300 mg by mouth 2 (two) times daily.    . TENOFOVIR DISOPROXIL FUMARATE 300 MG PO TABS  Oral Take 300 mg by mouth once a week.      BP 132/71  Pulse 77  Temp 98.1 F (36.7 C) (Oral)  Resp 20  SpO2 96%  Physical Exam  Nursing note and vitals reviewed. Constitutional: She appears well-developed and well-nourished. No distress.  HENT:  Head: Normocephalic and atraumatic.  Eyes: EOM are normal.  Neck: Neck supple. No tracheal deviation present.  Cardiovascular: Normal rate.   Pulmonary/Chest: Effort normal and breath sounds normal. No respiratory distress. She has no wheezes. She has no rales.  Musculoskeletal: Normal range of motion.  Neurological: She is alert.  Skin: Skin is warm and dry.  Psychiatric: She has a normal mood and affect. Her behavior is normal.    ED Course  Procedures (including critical care time) DIAGNOSTIC STUDIES: Oxygen Saturation is 96% on room air, adequate by my interpretation.    COORDINATION OF CARE: 13:30--I evaluated the patient.   13:33--I consulted with the pt's social worker to gain a better history as  requested by the patient.    Labs Reviewed  CBC WITH DIFFERENTIAL  COMPREHENSIVE METABOLIC PANEL   No results found.   No diagnosis found.    MDM   Patient came to the ER to get set up for dialysis. She was last dialyzed on Tuesday. She reportedly went-[place] and he was not dialyzed because it hasn't been set up. After discussion with social worker she reportedly had been discussed with nephrology. I called and discussed with Dr. Arlean Hopping who recommended admission to the hospitalist.    I personally performed the services described in this documentation, which was scribed in my presence. The recorded information has been reviewed and considered.           [name]. Rubin Payor, MD 05/10/12 1358

## 2012-05-10 NOTE — H&P (Signed)
History and Physical       Hospital Admission Note Date: 05/10/2012  Patient name: [name] Medical record number: [id number] Date of birth: 1951/01/20 Age: 61 y.o. Gender: female PCP: No primary provider on file.   Chief Complaint:  For hemodialysis  HPI: Patient is a 61 year old female with history of end-stage renal disease on hemodialysis Tues-Thurs-Sat, hypertension, HIV, remote cocaine use (last use one year ago per patient), chronic CHF presented to [facility] ED for hemodialysis. History was obtained from the patient, who is a poor historian. The patient stated that she decided to move to [place] area to live with a friend and her social worker took her to [facility] for dialysis. Per patient, she had last hemodialysis done on Tuesday at [facility], [place] then missed her dialysis on Thursday as she moved here on Wednesday. Patient otherwise denies any chest pain, nausea, vomiting or any shortness of breath. Patient states that she is moving around in the past 2 months to get away from "drug/cocaine people". She follows [facility] ID clinic and states that her "viral numbers are good".  Review of Systems:  Constitutional: Denies fever, chills, diaphoresis, appetite change and fatigue.  HEENT: Denies photophobia, eye pain, redness, hearing loss, ear pain, congestion, sore throat, rhinorrhea, sneezing, mouth sores, trouble swallowing, neck pain, neck stiffness and tinnitus.   Respiratory: Denies SOB, DOE, cough, chest tightness,  and wheezing.   Cardiovascular: Denies chest pain, palpitations and leg swelling.  Gastrointestinal: Denies nausea, vomiting, abdominal pain, diarrhea, constipation, blood in stool and abdominal distention.  Genitourinary: Denies dysuria, urgency, frequency, hematuria, flank pain and difficulty urinating.  Musculoskeletal: Denies myalgias, back pain, joint swelling,  arthralgias and gait problem.  Skin: Denies pallor, rash and wound.  Neurological: Denies dizziness, seizures, syncope, weakness, light-headedness, numbness and headaches.  Hematological: Denies adenopathy. Easy bruising, personal or family bleeding history  Psychiatric/Behavioral: Denies suicidal ideation, mood changes, confusion, nervousness, sleep disturbance and agitation  Past Medical History: Past Medical History  Diagnosis Date  . Hypertension   . HIV infection   . Chronic renal failure   . CHF (congestive heart failure)   . ESRD (end stage renal disease)    History reviewed. No pertinent past surgical history.  Medications: Prior to Admission medications   Medication Sig Start Date End Date Taking? Authorizing Provider  acetaminophen (TYLENOL) 500 MG tablet Take 1,000 mg by mouth every 6 (six) hours as needed. pain   Yes Historical Provider, MD  amitriptyline (ELAVIL) 10 MG tablet Take 20 mg by mouth at bedtime.   Yes Historical Provider, MD  amLODipine (NORVASC) 10 MG tablet Take 10 mg by mouth daily.   Yes Historical Provider, MD  carvedilol (COREG) 6.25 MG tablet Take 6.25 mg by mouth 2 (two) times daily with a meal.   Yes Historical Provider, MD  cinacalcet (SENSIPAR) 60 MG tablet Take 60 mg by mouth 2 (two) times daily.   Yes Historical Provider, MD  citalopram (CELEXA) 20 MG tablet Take 20 mg by mouth daily.   Yes Historical Provider, MD  furosemide (LASIX) 40 MG tablet Take 40 mg by mouth 2 (two) times daily.   Yes Historical Provider, MD  hydrALAZINE (APRESOLINE) 100 MG tablet Take 100 mg by mouth 3 (three) times daily.   Yes Historical Provider, MD  lamiVUDine (EPIVIR) 150 MG tablet Take 150 mg by mouth daily.   Yes Historical Provider, MD  lidocaine-prilocaine (EMLA) cream Apply 1 application topically as needed.   Yes Historical  Provider, MD  multivitamin (RENA-VIT) TABS tablet Take 1 tablet by mouth daily.   Yes Historical Provider, MD  Nutritional Supplements  (MELATONIN PO) Take 1 tablet by mouth at bedtime. 3 mg   Yes Historical Provider, MD  omeprazole (PRILOSEC) 20 MG capsule Take 20 mg by mouth daily.   Yes Historical Provider, MD  raltegravir (ISENTRESS) 400 MG tablet Take 400 mg by mouth 2 (two) times daily.   Yes Historical Provider, MD  sevelamer (RENVELA) 800 MG tablet Take 2,400 mg by mouth 3 (three) times daily with meals. 2400 mg with meals, 800 mg with snacks   Yes Historical Provider, MD  sodium bicarbonate 650 MG tablet Take 1,300 mg by mouth 2 (two) times daily.   Yes Historical Provider, MD  tenofovir (VIREAD) 300 MG tablet Take 300 mg by mouth once a week.   Yes Historical Provider, MD    Allergies:   Allergies  Allergen Reactions  . Morphine And Related     Social History:  reports that she has been smoking, states one pack lasts for 3 days. She reports that she drinks alcohol occasionally. She reports that she does not use illicit drugs currently, Last used cocaine was one year ago  Family History: History reviewed. No pertinent family history.  Physical Exam: Blood pressure 141/72, pulse 75, temperature 98.2 F (36.8 C), temperature source Oral, resp. rate 16, SpO2 99.00%. General: Alert, awake, oriented x3, in no acute distress. HEENT: normocephalic, atraumatic, anicteric sclera, pink conjunctiva, pupils equal and reactive to light and accomodation, oropharynx clear Neck: supple, no masses or lymphadenopathy, no goiter, no bruits  Heart: Regular rate and rhythm, without murmurs, rubs or gallops. Lungs: Clear to auscultation bilaterally, no wheezing, rales or rhonchi. Abdomen: Soft, nontender, nondistended, positive bowel sounds, no masses. Extremities: No clubbing, cyanosis with positive pedal pulses. Trace edema Neuro: Grossly intact, no focal neurological deficits, strength 5/5 upper and lower extremities bilaterally Psych: alert and oriented x 3, normal mood and affect Skin: no rashes or lesions, warm and  dry   LABS on Admission:  Basic Metabolic Panel:  Lab 05/10/12 1610  NA 139  K 4.0  CL 94*  CO2 23  GLUCOSE 91  BUN 65*  CREATININE 11.44*  CALCIUM 6.8*  MG --  PHOS --   Liver Function Tests:  Lab 05/10/12 1402  AST 16  ALT 16  ALKPHOS 294*  BILITOT 0.3  PROT 7.5  ALBUMIN 3.7   CBC:  Lab 05/10/12 1402  WBC 9.1  NEUTROABS 4.7  HGB 9.5*  HCT 28.9*  MCV 104.0*  PLT 233     Radiological Exams on Admission: No results found.  Assessment/Plan  Primary problem: .ESRD (end stage renal disease): - Will admit for hemodialysis, currently patient is asymptomatic however has missed last hemodialysis, baseline creatinine unknown, does not appear to be volume overloaded. - Renal service has been consulted, hemodialysis orders prior renal service.  Active problems:  .HIV infection - Will continue all HAART medications, check CD4 count and viral load. Discussed with Dr. Luciana Axe (ID) on phone and reviewed all HIV medications, per ID she is on appropriate dosing.   .Anemia:  Likely anemia of chronic disease, MCV elevated, also macrocytic  - Will check anemia panel, B12, folate   .Hypertension: - Continue outpatient antihypertensives    .CHF (congestive heart failure): Appears to be compensated, no shortness of breath  - Will continue all cardiac medications   .Nicotine abuse: - Patient was counseled on smoking cessation. Nicotine patch  was offered however patient declined.  DVT prophylaxis: Heparin subcutaneous  CODE STATUS: Full Code  Further plan will depend as patient's clinical course evolves and further radiologic and laboratory data become available.   Time Spent on Admission: 1 hour  RAI,RIPUDEEP M.D. [facility] 05/10/2012, 5:22 PM Pager: [phone]  If 7PM-7AM, please contact night-coverage [email or web address] Password [email or web address]

## 2012-05-10 NOTE — ED Notes (Signed)
Pt here for routine dialysis; pt moved to [facility] and working with case management to get set [place] county; pt last treatment was Tuesday; [facility] aware of pt

## 2012-05-10 NOTE — Progress Notes (Signed)
[name], is a 61 y.o. female,   MRN: [id number]  -  DOB - [birth year]  Outpatient Primary MD for the patient is No primary provider on file.   Blood pressure 141/72, pulse 75, temperature 98.2 F (36.8 C), temperature source Oral, resp. rate 16, SpO2 99.00%.  Active Problems:  ESRD (end stage renal disease)  HIV infection  Hypertension  CHF (congestive heart failure)    Per EDP, patient is a 61 yo female, new to area.  Has ESRD, needs dialysis.  [facility] (Dr. Arta Silence) has already been consulted.  Will request admission, observation, to 6700.   Algis Downs, PA-C [facility] Pager: [phone]

## 2012-05-10 NOTE — ED Provider Notes (Signed)
3:30 PM Assumed care of patient in the CDU from Dr. Rubin Payor.  Patient recently moved to [facility] from [facility] and does not yet have Dialysis set up.  Patient with history of ESRD.  Last dialysis was three days ago.  Plan is for the patient to be admitted once CMP comes back.  CMP unremarkable.  No hyperkalemia.  Will page Medicine to get the patient admitted.  Dr. Rubin Payor has already discussed with Nephrologist who agrees to do dialysis.    3:50 PM Discussed with [facility] who agrees to admit the patient for dialysis.  Pascal Lux [place], PA-C 05/10/12 1757

## 2012-05-11 ENCOUNTER — Inpatient Hospital Stay (HOSPITAL_COMMUNITY): Payer: Medicare Other

## 2012-05-11 LAB — CBC
HCT: 29.8 % — ABNORMAL LOW (ref 36.0–46.0)
Hemoglobin: 9.8 g/dL — ABNORMAL LOW (ref 12.0–15.0)
RBC: 2.87 MIL/uL — ABNORMAL LOW (ref 3.87–5.11)
RDW: 15.5 % (ref 11.5–15.5)
WBC: 6.7 10*3/uL (ref 4.0–10.5)

## 2012-05-11 LAB — BASIC METABOLIC PANEL
Chloride: 93 mEq/L — ABNORMAL LOW (ref 96–112)
Creatinine, Ser: 7.5 mg/dL — ABNORMAL HIGH (ref 0.50–1.10)
GFR calc Af Amer: 6 mL/min — ABNORMAL LOW (ref >90)
Potassium: 3.9 mEq/L (ref 3.5–5.1)
Sodium: 137 mEq/L (ref 135–145)

## 2012-05-11 LAB — RETICULOCYTES: Retic Ct Pct: 1.5 % (ref 0.4–3.1)

## 2012-05-11 LAB — GLUCOSE, CAPILLARY: Glucose-Capillary: 112 mg/dL — ABNORMAL HIGH (ref 70–99)

## 2012-05-11 MED ORDER — DARBEPOETIN ALFA-POLYSORBATE 60 MCG/0.3ML IJ SOLN
INTRAMUSCULAR | Status: AC
  Administered 2012-05-11: 60 ug via INTRAVENOUS
  Filled 2012-05-11: qty 0.3

## 2012-05-11 MED ORDER — PARICALCITOL 5 MCG/ML IV SOLN
INTRAVENOUS | Status: AC
  Administered 2012-05-11: 3 ug via INTRAVENOUS
  Filled 2012-05-11: qty 1

## 2012-05-11 NOTE — Progress Notes (Signed)
Patient ID: [name]  female  ZOX:[id number]    DOB: April 21, 1951    DOA: 05/10/2012  PCP: No primary provider on file.  Subjective: No specific complaints of nausea, vomiting, shortness of breath, fevers, somewhat cranky today, asking for regular diet, "when can I go, I am going to get my dialysis in [place]"    Objective: Weight change:   Intake/Output Summary (Last 24 hours) at 05/11/12 0942 Last data filed at 05/11/12 0900  Gross per 24 hour  Intake    120 ml  Output   3601 ml  Net  -3481 ml   Blood pressure 118/62, pulse 78, temperature 98.6 F (37 C), temperature source Oral, resp. rate 17, weight 92.4 kg (203 lb 11.3 oz), SpO2 95.00%.  Physical Exam: General: Alert and awake, oriented x3, not in any acute distress. HEENT: anicteric sclera, pupils reactive to light and accommodation, EOMI CVS: S1-S2 clear, no murmur rubs or gallops Chest: clear to auscultation bilaterally, no wheezing, rales or rhonchi Abdomen: soft nontender, nondistended, normal bowel sounds, no organomegaly Extremities: no cyanosis, clubbing or edema noted bilaterally Neuro: Cranial nerves II-XII intact, no focal neurological deficits  Lab Results: Basic Metabolic Panel:  Lab 05/10/12 8119  NA 139  K 4.0  CL 94*  CO2 23  GLUCOSE 91  BUN 65*  CREATININE 11.44*  CALCIUM 6.8*  MG --  PHOS --   Liver Function Tests:  Lab 05/10/12 1402  AST 16  ALT 16  ALKPHOS 294*  BILITOT 0.3  PROT 7.5  ALBUMIN 3.7  CBC:  Lab 05/10/12 1402  WBC 9.1  NEUTROABS 4.7  HGB 9.5*  HCT 28.9*  MCV 104.0*  PLT 233     Micro Results: Recent Results (from the past 240 hour(s))  MRSA PCR SCREENING     Status: Normal   Collection Time   05/11/12  3:09 AM      Component Value Range Status Comment   MRSA by PCR NEGATIVE  NEGATIVE Final     Studies/Results: No results found.  Medications: Scheduled Meds:   . amitriptyline  20 mg Oral QHS  . amLODipine  10 mg Oral Daily  . carvedilol  6.25 mg  Oral BID WC  . cinacalcet  60 mg Oral BID WC  . citalopram  20 mg Oral Daily  . darbepoetin (ARANESP) injection - DIALYSIS  60 mcg Intravenous Q Sat-HD  . ferric gluconate (FERRLECIT/NULECIT) IV  62.5 mg Intravenous Q Sat-HD  . furosemide  40 mg Oral BID  . heparin  5,000 Units Subcutaneous Q8H  . hydrALAZINE  100 mg Oral TID  . lamiVUDine  150 mg Oral Daily  . LORazepam  1 mg Oral Once  . multivitamin  1 tablet Oral QHS  . pantoprazole  40 mg Oral Q1200  . paricalcitol  3 mcg Intravenous Q T,Th,Sa-HD  . raltegravir  400 mg Oral BID  . sevelamer  2,400 mg Oral TID WC  . sodium bicarbonate  1,300 mg Oral BID  . tenofovir  300 mg Oral Weekly   Continuous Infusions:    Assessment/Plan: Principal Problem:  *ESRD (end stage renal disease) - Status post hemodialysis yesterday, regular HD day today - Renal service following, per Dr. Arlean Hopping, patient is to stay till Monday for dialysis center to be set up outpatient.  Active Problems:  HIV infection: Continue HAART regimen, patient follows ID clinic [facility], on appropriate dosing per ID   Hypertension: Stable   CHF (congestive heart failure): Compensated, no acute  issues   Nicotine abuse: Declines nicotine patch   Anemia: Likely anemia of chronic disease, on Aranesp  DVT Prophylaxis: Heparin subcutaneous  Code Status: Full code  Disposition: Unclear, no acute medical issues, renal service to assume care, and discussed with Dr. Arlean Hopping.   LOS: 1 day   RAI,RIPUDEEP M.D. [facility] 05/11/2012, 9:42 AM Pager: [phone]  If 7PM-7AM, please contact night-coverage [email or web address] Password [email or web address]

## 2012-05-11 NOTE — ED Provider Notes (Signed)
Medical screening examination/treatment/procedure(s) were conducted as a shared visit with non-physician practitioner(s) and myself.  I personally evaluated the patient during the encounter  [name]. Rubin Payor, MD 05/11/12 1506

## 2012-05-11 NOTE — Progress Notes (Signed)
Subjective: No complaints except doesn't like the food.   Objective Vital signs in last 24 hours: Filed Vitals:   05/10/12 2235 05/11/12 0522 05/11/12 0916 05/11/12 1158  BP: 134/70 103/55 118/62 116/60  Pulse: 88 76 78 79  Temp: 98.8 F (37.1 C) 98 F (36.7 C) 98.6 F (37 C) 98.4 F (36.9 C)  TempSrc: Oral Oral Oral Oral  Resp: 18 17 17 17   Weight: 92.4 kg (203 lb 11.3 oz)     SpO2: 92% 96% 95% 91%   Weight change:   Intake/Output Summary (Last 24 hours) at 05/11/12 1212 Last data filed at 05/11/12 0900  Gross per 24 hour  Intake    120 ml  Output   3601 ml  Net  -3481 ml   Labs: Basic Metabolic Panel:  Lab 05/10/12 1478  NA 139  K 4.0  CL 94*  CO2 23  GLUCOSE 91  BUN 65*  CREATININE 11.44*  ALB --  CALCIUM 6.8*  PHOS --   Liver Function Tests:  Lab 05/10/12 1402  AST 16  ALT 16  ALKPHOS 294*  BILITOT 0.3  PROT 7.5  ALBUMIN 3.7   No results found for this basename: LIPASE:3,AMYLASE:3 in the last 168 hours No results found for this basename: AMMONIA:3 in the last 168 hours CBC:  Lab 05/10/12 1402  WBC 9.1  NEUTROABS 4.7  HGB 9.5*  HCT 28.9*  MCV 104.0*  PLT 233   PT/INR: @labrcntip (inr:5) Cardiac Enzymes: No results found for this basename: CKTOTAL:5,CKMB:5,CKMBINDEX:5,TROPONINI:5 in the last 168 hours CBG: No results found for this basename: GLUCAP:5 in the last 168 hours  Iron Studies: No results found for this basename: IRON:30,TIBC:30,TRANSFERRIN:30,FERRITIN:30 in the last 168 hours  Physical Exam:  Blood pressure 116/60, pulse 79, temperature 98.4 F (36.9 C), temperature source Oral, resp. rate 17, weight 92.4 kg (203 lb 11.3 oz), SpO2 91.00%.  General: Alert bf nad, Pleasant  Neck: No jvd  Heart: RRR, norub or murmur  Lungs: cta  Abdomen: Obese, soft, nontender  Extremities: Trace bipedal edema  Skin: warm dry  Neuro: ox3 , moves all extremities  Dialysis Access: Positive bruit left fa avf   Dialysis Orders: Center: [facility] on tthsat .  EDW 99.5 kg HD Bath 2k ,2.5 ca Time 3.5 hrs Heparin standard. Access left fa avf BFR 450 DFR 600  Zemplar 3 mcg IV/HD Epogen 4400 Units IV/HD Venofer 50mg  per week   Assessment/Plan  1. ESRD was TTS at [facility]- moved to [place] spontaneously and thought she could start dialysis there without making prearrangements. Admitted therefore for dialysis, last HD was Tuesday 7/31. Had Hd last night and again today to keep on TTS schedule. Patient will stay in hospital, wants to be dialyzed at [facility] dialysis unit with [facility].  Will start CLIP process on Monday.  Will need to stay in hospital until accepted at the unit in [place], she says she is not going back to the [facility] unit where she has been doing dialysis for 2 years.  2. Hypertension/volume - bp 143/70 use meds and a attempt 3 to 4.5 liters uf on hd 3. Anemia - hgb 9.5 epo and venofer on hd 4. Metabolic bone disease - zemplar on hd/ ca =6.8 check phos. Renvela binders 5. HiV= meds per admit    Vinson Moselle  MD [facility] [phone] pgr    [phone] cell 05/11/2012, 12:12 PM

## 2012-05-11 NOTE — Progress Notes (Signed)
Pt admitted to unit 6700, room 6706. Pt alert and oriented. Call bell placed within pt's reach. Pt c/o cramping 9/10 left side, under ribcage. MD notified. Will continue to monitor.

## 2012-05-12 LAB — GLUCOSE, CAPILLARY: Glucose-Capillary: 99 mg/dL (ref 70–99)

## 2012-05-12 LAB — CULTURE, BLOOD (SINGLE)

## 2012-05-12 LAB — TSH: TSH: 0.45 u[IU]/mL (ref 0.350–4.500)

## 2012-05-12 LAB — VITAMIN B12: Vitamin B-12: 527 pg/mL (ref 211–911)

## 2012-05-12 MED ORDER — HYDRALAZINE HCL 50 MG PO TABS
50.0000 mg | ORAL_TABLET | Freq: Two times a day (BID) | ORAL | Status: DC
  Administered 2012-05-12 – 2012-05-13 (×3): 50 mg via ORAL
  Filled 2012-05-12 (×5): qty 1

## 2012-05-12 MED ORDER — AMLODIPINE BESYLATE 5 MG PO TABS
5.0000 mg | ORAL_TABLET | Freq: Every day | ORAL | Status: DC
  Filled 2012-05-12: qty 1

## 2012-05-12 MED ORDER — HYDROCODONE-ACETAMINOPHEN 5-325 MG PO TABS
1.0000 | ORAL_TABLET | ORAL | Status: DC | PRN
  Administered 2012-05-12: 2 via ORAL
  Administered 2012-05-13 (×3): 1 via ORAL
  Filled 2012-05-12 (×2): qty 1
  Filled 2012-05-12: qty 2
  Filled 2012-05-12: qty 1

## 2012-05-12 MED ORDER — AMLODIPINE BESYLATE 5 MG PO TABS
5.0000 mg | ORAL_TABLET | Freq: Every day | ORAL | Status: DC
  Administered 2012-05-13: 5 mg via ORAL
  Filled 2012-05-12 (×2): qty 1

## 2012-05-12 MED ORDER — IBUPROFEN 600 MG PO TABS
600.0000 mg | ORAL_TABLET | Freq: Three times a day (TID) | ORAL | Status: DC
  Administered 2012-05-12 – 2012-05-13 (×6): 600 mg via ORAL
  Filled 2012-05-12 (×9): qty 1

## 2012-05-12 NOTE — Progress Notes (Addendum)
Subjective:  cos bilateral knee stiffness,"had Cortisol injections in [place]  In Past"  Walked in room yesterday without problems.Tolerated HD yesterday Objective Vital signs in last 24 hours: Filed Vitals:   05/11/12 1643 05/11/12 2027 05/11/12 2245 05/12/12 0534  BP: 120/62 96/53 102/60 120/71  Pulse: 95 78 70 80  Temp: 98.5 F (36.9 C) 98.9 F (37.2 C)  98.2 F (36.8 C)  TempSrc: Oral Oral  Oral  Resp: 18 18  18   Weight:  90.6 kg (199 lb 11.8 oz)    SpO2: 97% 95%  99%   Weight change: -17.8 kg (-39 lb 3.9 oz)  Intake/Output Summary (Last 24 hours) at 05/12/12 0916 Last data filed at 05/11/12 1900  Gross per 24 hour  Intake    480 ml  Output   1702 ml  Net  -1222 ml   Labs: Basic Metabolic Panel:  Lab 05/11/12 4098 05/10/12 1402  NA 137 139  K 3.9 4.0  CL 93* 94*  CO2 29 23  GLUCOSE 102* 91  BUN 27* 65*  CREATININE 7.50* 11.44*  CALCIUM 8.1* 6.8*  ALB -- --  PHOS 5.3* --   Liver Function Tests:  Lab 05/11/12 1228 05/10/12 1402  AST -- 16  ALT -- 16  ALKPHOS -- 294*  BILITOT -- 0.3  PROT -- 7.5  ALBUMIN 3.7 3.7   No results found for this basename: LIPASE:3,AMYLASE:3 in the last 168 hours No results found for this basename: AMMONIA:3 in the last 168 hours CBC:  Lab 05/11/12 1228 05/10/12 1402  WBC 6.7 9.1  NEUTROABS -- 4.7  HGB 9.8* 9.5*  HCT 29.8* 28.9*  MCV 103.8* 104.0*  PLT 251 233   Cardiac Enzymes: No results found for this basename: CKTOTAL:5,CKMB:5,CKMBINDEX:5,TROPONINI:5 in the last 168 hours CBG:  Lab 05/12/12 0741 05/11/12 1641  GLUCAP 99 112*    Iron Studies: No results found for this basename: IRON,TIBC,TRANSFERRIN,FERRITIN in the last 72 hours Studies/Results: No results found. Medications:      . amitriptyline  20 mg Oral QHS  . amLODipine  10 mg Oral Daily  . carvedilol  6.25 mg Oral BID WC  . cinacalcet  60 mg Oral BID WC  . citalopram  20 mg Oral Daily  . darbepoetin (ARANESP) injection - DIALYSIS  60 mcg  Intravenous Q Sat-HD  . ferric gluconate (FERRLECIT/NULECIT) IV  62.5 mg Intravenous Q Sat-HD  . furosemide  40 mg Oral BID  . heparin  5,000 Units Subcutaneous Q8H  . hydrALAZINE  100 mg Oral TID  . lamiVUDine  150 mg Oral Daily  . multivitamin  1 tablet Oral QHS  . pantoprazole  40 mg Oral Q1200  . paricalcitol  3 mcg Intravenous Q T,Th,Sa-HD  . raltegravir  400 mg Oral BID  . sevelamer  2,400 mg Oral TID WC  . sodium bicarbonate  1,300 mg Oral BID  . tenofovir  300 mg Oral Weekly   I  have reviewed scheduled and prn medications.  Physical Exam: General: NAD, alert BF Heart: RRR, norub or murmur  Lungs:CTA Bilaterally Abdomen: Obese, soft, nontender  Extremities: Dialysis Access: No pedal edema, Bilateral patella DJD changes, knee and hip good ROM/,some minimal discomfort with bilat. Knee rom/ straight leg raises  No pain,,, Posiitve bruit  Left fa AVGG.   Dialysis Orders: Center: [facility] on tthsat .  EDW 99.5 kg HD Bath 2k ,2.5 ca Time 3.5 hrs Heparin standard. Access left fa avf BFR 450 DFR 600  Zemplar 3 mcg  IV/HD Epogen 4400 Units IV/HD Venofer 50mg  per week  Assessment/Plan  1.  ESRD was TTS at [facility]-  Hd yesterday without problems, post wt 88.6kg/ Below prior edw of 99.5  Will dc LASIX .Will need to stay in hospital Until accepted at the unit in [place], she says she is not going back to the [facility] unit where she has been doing dialysis for 2 years.Start CLIP process on Monday if accepted .2.Hypertension/volume - bp  Yesterday after hd 90's and this am 120/71  Will taper down  some bp meds = dc Lasix/ Amlodipine 10 mg to 5mg  and hs, Hydralazine 100 mg tid to 50mg  bid/ coreg 6.25mg  bid  Hold am of hd/ new edw lower 3.Anemia - hgb 9.8 yesterday pre hd/ epo and venofer on hd 4.Metabolic bone disease - zemplar10mcg on hd/ ca =8.1 annd phos 5.3. Renvela binders/ Sensipar/ Last PTH-I by DAVITA Records= 1630 June 2013 5.HiV= meds per admit 6. Knee  pain cw  DJD = Ibuprofen 600mg  tid with meals for  Tid with meals for 24 hr.  Then re-eval. Lenny Pastel, PA-C [facility] [phone] 05/12/2012,9:16 AM  LOS: 2 days   Patient seen and examined and agree with assessment and plan as above.  Vinson Moselle  MD [facility] [phone] pgr    [phone] cell 05/12/2012, 1:20 PM  ADDENDUM= She needs to make arrangements for HIV clinic fu in [facility] her HX "saw last week, good numbers"  Also dc  Po  Sodium bicarbonate.

## 2012-05-13 LAB — GLUCOSE, CAPILLARY: Glucose-Capillary: 235 mg/dL — ABNORMAL HIGH (ref 70–99)

## 2012-05-13 MED ORDER — LAMIVUDINE 10 MG/ML PO SOLN
25.0000 mg | Freq: Every day | ORAL | Status: DC
  Administered 2012-05-13: 25 mg via ORAL
  Filled 2012-05-13 (×2): qty 5

## 2012-05-13 MED ORDER — HEPARIN SODIUM (PORCINE) 1000 UNIT/ML DIALYSIS
100.0000 [IU]/kg | INTRAMUSCULAR | Status: DC | PRN
  Administered 2012-05-14: 8800 [IU] via INTRAVENOUS_CENTRAL
  Filled 2012-05-13: qty 9

## 2012-05-13 NOTE — Progress Notes (Signed)
[facility] Progress Note  Subjective:  Ready to leave. Doesn't see why she can't They ([facility]) said her transporation would be ready Tuesday. Refused tray. Asking for another.  I'm not going back to that [facility] unit.  Seems pleasant enough when I talk to her.  Ids understanding when I tell her that it will take some time to get established, hopefully by tomorow after HD she will be ready to discharge  Objective Filed Vitals:   05/12/12 1415 05/12/12 1700 05/12/12 2046 05/13/12 0555  BP: 132/72 105/64 106/71 118/75  Pulse:  81 78 82  Temp:  98.7 F (37.1 C) 98 F (36.7 C) 98.5 F (36.9 C)  TempSrc:  Oral Oral Oral  Resp:  18 18 18   Weight:   88.4 kg (194 lb 14.2 oz)   SpO2:  94% 94% 94%   Physical Exam General: NAD Heart:  RRR Lungs: no wheezes or rales Abdomen: obese osft Extremities: no LE edema Dialysis Access: left lower AVF patent   Dialysis Orders: Center: [facility] on tthsat .  EDW 99.5 kg HD Bath 2k ,2.5 ca Time 3.5 hrs Heparin standard. Access left fa avf BFR 450 DFR 600  Zemplar 3 mcg IV/HD Epogen 4400 Units IV/HD Venofer 50mg  per week   Assessment/Plan  1. ESRD was TTS at [facility]-  post wt Sat 88.6kg/ Below prior edw of 99.5 .Will need to stay in hospital Until accepted at the unit in [place], she says she is not going back to the [facility] unit where she has been doing dialysis for 2 years.Start CLIP process on Monday if accepted  Dr. Allena Katz notified.  Demanding about what she will and won't do.  Explained the process of being accepted into a new unit. Nwxt HD Tuesday first round.  Hopefully can be discharged after HD tomorrow .2.Hypertension/volume - bp Yesterday after hd 90's and this am 120/71 Will taper down some bp meds = dc Lasix/ Amlodipine 10 mg to 5mg  and hs, Hydralazine 100 mg tid to 50mg  bid/ coreg 6.25mg  bid Hold am of hd/ new edw lower  3.Anemia - hgb 9.8  pre hd/ epo and venofer on hd - agree with  all 4.Metabolic bone disease - zemplar88mcg on hd/ ca =8.1 annd phos 5.3. Renvela binders/ Sensipar/ Last PTH-I by DAVITA Records= 1630 June 2013  5.HiV= meds per admit  6. Knee pain cw DJD = Ibuprofen 600mg  tid with meals for Tid with meals for 24 hr. Then re-eval.   Sheffield Slider, PA-C [facility] [phone]  05/13/2012,8:34 AM  LOS: 3 days   Agree with above note.  61 year old BF who has been on HD for 2 years.  Now has moved to [place] and will need placement at HD unit in [place].  Hopefully will be figured out by tomorrow , next HD tomorrow here, then next will be ?Thursday at [facility]? We will titrate BP meds down and establish EDW while here  [place] Stanger A    Additional Objective Labs: Basic Metabolic Panel:  Lab 05/11/12 4540 05/10/12 1402  NA 137 139  K 3.9 4.0  CL 93* 94*  CO2 29 23  GLUCOSE 102* 91  BUN 27* 65*  CREATININE 7.50* 11.44*  CALCIUM 8.1* 6.8*  ALB -- --  PHOS 5.3* --   Liver Function Tests:  Lab 05/11/12 1228 05/10/12 1402  AST -- 16  ALT -- 16  ALKPHOS -- 294*  BILITOT -- 0.3  PROT -- 7.5  ALBUMIN 3.7 3.7   CBC:  Lab 05/11/12 1228 05/10/12 1402  WBC 6.7 9.1  NEUTROABS -- 4.7  HGB 9.8* 9.5*  HCT 29.8* 28.9*  MCV 103.8* 104.0*  PLT 251 233  CBG:  Lab 05/12/12 0741 05/11/12 1641  GLUCAP 99 112*  Medications:      . amitriptyline  20 mg Oral QHS  . amLODipine  5 mg Oral QHS  . carvedilol  6.25 mg Oral BID WC  . cinacalcet  60 mg Oral BID WC  . citalopram  20 mg Oral Daily  . darbepoetin (ARANESP) injection - DIALYSIS  60 mcg Intravenous Q Sat-HD  . ferric gluconate (FERRLECIT/NULECIT) IV  62.5 mg Intravenous Q Sat-HD  . heparin  5,000 Units Subcutaneous Q8H  . hydrALAZINE  50 mg Oral BID  . ibuprofen  600 mg Oral TID  . lamiVUDine  150 mg Oral Daily  . multivitamin  1 tablet Oral QHS  . pantoprazole  40 mg Oral Q1200  . paricalcitol  3 mcg Intravenous Q T,Th,Sa-HD  . raltegravir  400 mg Oral  BID  . sevelamer  2,400 mg Oral TID WC  . tenofovir  300 mg Oral Weekly  . DISCONTD: amLODipine  10 mg Oral Daily  . DISCONTD: amLODipine  5 mg Oral QHS  . DISCONTD: furosemide  40 mg Oral BID  . DISCONTD: hydrALAZINE  100 mg Oral TID  . DISCONTD: sodium bicarbonate  1,300 mg Oral BID

## 2012-05-13 NOTE — Clinical Documentation Improvement (Signed)
CHF DOCUMENTATION CLARIFICATION QUERY  THIS DOCUMENT IS NOT A PERMANENT PART OF THE MEDICAL RECORD  TO RESPOND TO THE THIS QUERY, FOLLOW THE INSTRUCTIONS BELOW:  1. If needed, update documentation for the patient's encounter via the notes activity.  2. Access this query again and click edit on the In [employer].  3. After updating, or not, click F2 to complete all highlighted (required) fields concerning your review. Select "additional documentation in the medical record" OR "no additional documentation provided".  4. Click Sign note button.  5. The deficiency will fall out of your In Basket *Please let us know if you are not able to complete this workflow by phone or e-mail (listed below).  Please update your documentation within the medical record to reflect your response to this query.                                                                                    05/13/12  Dear Dr. Yazleemar Strassner/ Associates,  In a better effort to capture your patient's severity of illness, reflect appropriate length of stay and utilization of resources, a review of the patient medical record has revealed the following indicators the diagnosis of Heart Failure.    Based on your clinical judgment, please clarify and document in a progress note and/or discharge summary the clinical condition associated with the following supporting information:  In responding to this query please exercise your independent judgment.  The fact that a query is asked, does not imply that any particular answer is desired or expected.  Possible Clinical Conditions?   Chronic Systolic Congestive Heart Failure Chronic Diastolic Congestive Heart Failure Chronic Systolic & Diastolic Congestive Heart Failure Acute Systolic Congestive Heart Failure Acute Diastolic Congestive Heart Failure Acute Systolic & Diastolic Congestive Heart Failure Acute on Chronic Systolic Congestive Heart Failure Acute on Chronic Diastolic Congestive  Heart Failure Acute on Chronic Systolic & Diastolic  Congestive Heart Failure Other Condition Cannot Clinically Determine  Supporting Information:  Risk Factors: (As per Notes) "Chronic CHF" Signs & Symptoms:(As per Notes)".CHF (congestive heart failure): Appears to be compensated, no shortness of breath"      Treatment:(As per notes) "- Will continue all cardiac medications"  Reviewed: Patient has no prior ECHO in system, so cannot clinically determine and she is asymptomatic.   Thank You,  Joanette Gula Delk RN,BSN Clinical Documentation Specialist: [phone] Pager Health Information Management [place]

## 2012-05-13 NOTE — Progress Notes (Signed)
Utilization review completed.  

## 2012-05-13 NOTE — Clinical Documentation Improvement (Signed)
CHF DOCUMENTATION CLARIFICATION QUERY  THIS DOCUMENT IS NOT A PERMANENT PART OF THE MEDICAL RECORD  TO RESPOND TO THE THIS QUERY, FOLLOW THE INSTRUCTIONS BELOW:  1. If needed, update documentation for the patient's encounter via the notes activity.  2. Access this query again and click edit on the In [employer].  3. After updating, or not, click F2 to complete all highlighted (required) fields concerning your review. Select "additional documentation in the medical record" OR "no additional documentation provided".  4. Click Sign note button.  5. The deficiency will fall out of your In Basket *Please let us know if you are not able to complete this workflow by phone or e-mail (listed below).  Please update your documentation within the medical record to reflect your response to this query.                                                                                    05/13/12  Dear Dr. Isidoro Donning / Associates,  In a better effort to capture your patient's severity of illness, reflect appropriate length of stay and utilization of resources, a review of the patient medical record has revealed the following indicators the diagnosis of Heart Failure.    Based on your clinical judgment, please clarify and document in a progress note and/or discharge summary the clinical condition associated with the following supporting information:  In responding to this query please exercise your independent judgment.  The fact that a query is asked, does not imply that any particular answer is desired or expected.  Possible Clinical Conditions?  CHF Left Heart Failure Systolic Congestive Heart Failure Diastolic Congestive Heart Failure Systolic & Diastolic Congestive Heart Failure Chronic Systolic Congestive Heart Failure Chronic Diastolic Congestive Heart Failure Chronic Systolic & Diastolic Congestive Heart Failure Acute Systolic Congestive Heart Failure Acute Diastolic Congestive Heart  Failure Acute Systolic & Diastolic Congestive Heart Failure Acute on Chronic Systolic Congestive Heart Failure Acute on Chronic Diastolic Congestive Heart Failure Acute on Chronic Systolic & Diastolic  Congestive Heart Failure Other Condition________________________________________ Cannot Clinically Determine  Supporting Information:  Risk Factors: Signs & Symptoms:  Diagnostics: Treatment:  DUPLICATE  DUE TO COMPUTER CLOSING OUT DUE TO UPDATE-CLOSE THIS QUERY Thank You,  Joanette Gula Delk  Clinical Documentation Specialist: [phone] Pager  Health Information Management [place]

## 2012-05-14 ENCOUNTER — Inpatient Hospital Stay (HOSPITAL_COMMUNITY): Payer: Medicare Other

## 2012-05-14 LAB — RENAL FUNCTION PANEL
Albumin: 3.5 g/dL (ref 3.5–5.2)
BUN: 45 mg/dL — ABNORMAL HIGH (ref 6–23)
CO2: 23 mEq/L (ref 19–32)
Calcium: 7.9 mg/dL — ABNORMAL LOW (ref 8.4–10.5)
Chloride: 91 mEq/L — ABNORMAL LOW (ref 96–112)
Creatinine, Ser: 9.6 mg/dL — ABNORMAL HIGH (ref 0.50–1.10)
GFR calc Af Amer: 5 mL/min — ABNORMAL LOW (ref >90)
GFR calc non Af Amer: 4 mL/min — ABNORMAL LOW (ref >90)
Glucose, Bld: 88 mg/dL (ref 70–99)
Phosphorus: 5 mg/dL — ABNORMAL HIGH (ref 2.3–4.6)
Potassium: 3.9 mEq/L (ref 3.5–5.1)
Sodium: 132 mEq/L — ABNORMAL LOW (ref 135–145)

## 2012-05-14 LAB — CBC
HCT: 27.3 % — ABNORMAL LOW (ref 36.0–46.0)
Hemoglobin: 9.5 g/dL — ABNORMAL LOW (ref 12.0–15.0)
MCH: 35.6 pg — ABNORMAL HIGH (ref 26.0–34.0)
MCHC: 34.8 g/dL (ref 30.0–36.0)
MCV: 102.2 fL — ABNORMAL HIGH (ref 78.0–100.0)
Platelets: 244 10*3/uL (ref 150–400)
RBC: 2.67 MIL/uL — ABNORMAL LOW (ref 3.87–5.11)
RDW: 15 % (ref 11.5–15.5)
WBC: 7 10*3/uL (ref 4.0–10.5)

## 2012-05-14 MED ORDER — AMLODIPINE BESYLATE 5 MG PO TABS: 5.0000 mg | ORAL_TABLET | Freq: Every day | ORAL | Status: DC

## 2012-05-14 MED ORDER — SEVELAMER CARBONATE 800 MG PO TABS: 2400.0000 mg | ORAL_TABLET | Freq: Three times a day (TID) | ORAL | Status: DC

## 2012-05-14 MED ORDER — PARICALCITOL 5 MCG/ML IV SOLN
5.0000 ug | INTRAVENOUS | Status: DC
  Administered 2012-05-14: 5 ug via INTRAVENOUS
  Filled 2012-05-14: qty 1

## 2012-05-14 MED ORDER — PARICALCITOL 5 MCG/ML IV SOLN
INTRAVENOUS | Status: AC
  Administered 2012-05-14: 5 ug via INTRAVENOUS
  Filled 2012-05-14: qty 1

## 2012-05-14 MED ORDER — HYDRALAZINE HCL 50 MG PO TABS: 50.0000 mg | ORAL_TABLET | Freq: Two times a day (BID) | ORAL | Status: DC

## 2012-05-14 NOTE — Progress Notes (Signed)
[facility] Progress Note  Subjective:  Would like to go home.  No complaints today.  Objective Filed Vitals:   05/14/12 0630 05/14/12 0701 05/14/12 0730 05/14/12 0800  BP: 138/75 144/78 145/85 145/78  Pulse: 76 77 78 78  Temp: 97.3 F (36.3 C)     TempSrc: Oral     Resp: 18  17 16   Weight: 104.2 kg (229 lb 11.5 oz)     SpO2: 93%      Physical Exam General:NAD on HD, pleasant Heart: RRR Lungs: no wheezes or rales Abdomen: obese soft Extremities:no LE edema Qb400 Dialysis Access: left lower AVF  Dialysis Orders: Center: [facility] on tthsat .  EDW 99.5 kg HD Bath 2k ,2.5 ca Time 3.5 hrs Heparin 2000 bolus with 1000 per hour. Access left fa avf BFR 450 DFR 600  hectorol 1.5 IV/HD Epogen 4400 Units IV/HD Venofer 50mg  per week   Assessment/Plan  1. ESRD was TTS at [facility]- post wt Sat 88.6kg likely an error. EDWof 99.5, but per records not getting close. Have started CLIP to [facility], I am confident that we will have a spot for her by Thursday therefore am comfortable sending her home today after HD  2.Hypertension/volume - BP higher, meds being tapered. Increase goal today to 4000.  Will taper down some bp meds = dc Lasix/ Amlodipine 10 mg to 5mg  and hs, Hydralazine 100 mg tid to 50mg  bid/ coreg 6.25mg  bid Hold am of hd/ new edw lower. (had admission at [facility] in June for respiratory distress thought to be fluid related, accelerated HTN and SVT tx adenosine- likely related to crack use; d/c summary not available) agree with titrating bp meds down and continue to strive for lower EDW 3.Anemia - hgb 9s stable pre hd/ epo and venofer on hd -   4.Metabolic bone disease - hectorol  1.5 mcg on HD - PTA; Ca/P well controlled with renvela binders/ Sensipar/ Last PTH-I by DAVITA Records= 1630; increase zemplar from 3 to 5 q HD 5.HIV - per primary 6. Knee pain cw DJD = Ibuprofen 600mg  tid with meals for Tid with meals for 24 hr. Then re-eval.  7.  Disp - ok  from renal for d/c today; hopefully we can contact her after discharge to advise her when she can start in [place]; Her cell is [phone]; she will charge her phone after she gets home. 8. Polysubstance abuse - crack, cannibis on going and tobacco 3 ppd 9. Noncompliance with dialysis/meds - mostly related to crack use. 10. Psycho-social - recent death of husband who apparently had been incarcerated  Sheffield Slider, PA-C [facility] [phone]  05/14/2012,8:07 AM  LOS: 4 days   Patient seen and examined, agree with above note with above modifications. Will be discharged today after HD and will work to set up OP HD in [place] for Thursday.  Annie Sable, MD 05/14/2012     Additional Objective Labs: Basic Metabolic Panel:  Lab 05/14/12 3086 05/11/12 1228 05/10/12 1402  NA 132* 137 139  K 3.9 3.9 4.0  CL 91* 93* 94*  CO2 23 29 23   GLUCOSE 88 102* 91  BUN 45* 27* 65*  CREATININE 9.60* 7.50* 11.44*  CALCIUM 7.9* 8.1* 6.8*  ALB -- -- --  PHOS 5.0* 5.3* --   Liver Function Tests:  Lab 05/14/12 0656 05/11/12 1228 05/10/12 1402  AST -- -- 16  ALT -- -- 16  ALKPHOS -- -- 294*  BILITOT -- -- 0.3  PROT -- -- 7.5  ALBUMIN 3.5 3.7 3.7   CBC:  Lab 05/14/12 0656 05/11/12 1228 05/10/12 1402  WBC 7.0 6.7 9.1  NEUTROABS -- -- 4.7  HGB 9.5* 9.8* 9.5*  HCT 27.3* 29.8* 28.9*  MCV 102.2* 103.8* 104.0*  PLT 244 251 233  CBG:  Lab 05/13/12 1631 05/12/12 0741 05/11/12 1641  GLUCAP 235* 99 112*  Medications:      . amitriptyline  20 mg Oral QHS  . amLODipine  5 mg Oral QHS  . carvedilol  6.25 mg Oral BID WC  . cinacalcet  60 mg Oral BID WC  . citalopram  20 mg Oral Daily  . darbepoetin (ARANESP) injection - DIALYSIS  60 mcg Intravenous Q Sat-HD  . ferric gluconate (FERRLECIT/NULECIT) IV  62.5 mg Intravenous Q Sat-HD  . heparin  5,000 Units Subcutaneous Q8H  . hydrALAZINE  50 mg Oral BID  . ibuprofen  600 mg Oral TID  . lamiVUDine  25 mg Oral  q1800  . multivitamin  1 tablet Oral QHS  . pantoprazole  40 mg Oral Q1200  . paricalcitol  3 mcg Intravenous Q T,Th,Sa-HD  . raltegravir  400 mg Oral BID  . sevelamer  2,400 mg Oral TID WC  . tenofovir  300 mg Oral Weekly  . DISCONTD: lamiVUDine  150 mg Oral Daily

## 2012-05-14 NOTE — Progress Notes (Signed)
Pt given discharge instruction. Pt a&o x3 No family present at bedside. Pt verbalizes understanding of instructions and signs and symptoms to follow up with MD for, volunteer here to take patient to main entrance.

## 2012-05-14 NOTE — Discharge Summary (Signed)
Physician Discharge Summary  Patient ID:  [name]  MRN: [id number] DOB/AGE: [id number] y.o.  Admit date: 05/10/2012 Discharge date: 05/14/2012  Discharge Diagnoses: 1. ESRD 2. HIV 3. Diabetes - diet controlled 4. HTN 5. Bilateral knee pain  6. Anemia 7. Secondary hyperparathyroidism 8. Polysubstance abuse: cannabis, crack cocaine and tobacco 9. Medical noncompliance  Consults: Nephrology Significant Diagnostic Studies: Treatments:Hemodialysis  HPI:  Patient is a 61 year old female with history of end-stage renal disease on hemodialysis Tues-Thurs-Sat, hypertension, HIV, remote cocaine use (last use one year ago per patient - BUT [place] admission [facility] in June 2013 states she reportedly used crack prior to that admission), chronic CHF presented to [facility] ED for hemodialysis. History was obtained from the patient, who is a poor historian. The patient stated that she decided to move to [place] area to live with a friend and her social worker took her to [facility] for dialysis. She was sent to [facility] because not prior arrangements had bad made to transfer her care to [facility].  Per patient, she had last hemodialysis done on Tuesday at [facility] then missed her dialysis on Thursday as she moved here on Wednesday. Patient otherwise denies any chest pain, nausea, vomiting or any shortness of breath. Patient states that she is moving around in the past 2 months to get away from "drug/cocaine people". She follows [facility] ID clinic and states that her "viral numbers are good". She initially started HD in October 2011 in and dialyzed in [place] at that time. Her husband recently died from "terminal cancer in prison" in late July. The patient was initially admitted by the Hospitalist service then transferred to [facility].  Hospital Course: 1. ESRD - The patients records were obtained from the  [place] unit.  They were not aware ofher intention to move to [facility].  Outpatient records showed she had not been getting to her EDW.  She was dialyzed here three times with a post wt of 101.3 the date of discharge.  Her prior EDW was 99.5. Her new EDW will bee 100.5 and titrated down as needed.  Her BP meds had been decreased to help accomplish this.  The CLIP office has been contacted and we are awaiting the exact outpatient time.  She will be discharged today.  We will contact her with the exact time for dialysiswhen it is know.  She has information to set up transportation with [facility].. She is advised to stop her sodium bicarbonate and lasix. 2.  HIV- she will continue on the same HIV meds and be followed by the ID group in [facility]. 3. Diabetes - currently is diet controlled.  BS were well controlled here.  We will monitor monthly glucose levels and quarterly HGB A1Cs with monthly dialysis labs. 4.  Hypertension - as per #1.  Norvasc was decreased from 10 to 5 and hydralyzine 100 tid to 50 tid.  Prescriptions were given for both, but I explained that she can try cutting pills in half first.  Coreg remains the same at 6.25 bid. 5.  Bialteral knee pain - hx of cortisol injections in the past.  Given ibuprofen for pain prn. Can continue OTC NSAIDs at discharge.  I would not prescribe narcotics at this time given hx of crack abuse. 6. Anemia - Hgb is stable in the mid 9s.  Have increased her Epo slightly at discharge to 6000 TIW from her previous 4400 TIW  dose and continue weekly Venofer. 7.  Secondary hyperparathryoidism - per outpatient records iPTH is has declined from 2100 to 1600. Phosphorus is controlled here.  She will be discharged on zemplar 5 mcg q HD and labs will be followed per dialysis protocol. 8.  Polysubstance abuse - she has received tobacco cessation information.  Hopefully moving to a new environment will help her stay away from drugs. 9.  History on  noncompliance - most likely related to psycho-social stressors and substance abuse.    Diet: 80/90 - 2 2 renal 1200 cc/day  Discharge Medications:  Medication List  As of 05/14/2012 12:54 PM   STOP taking these medications         furosemide 40 MG tablet      sodium bicarbonate 650 MG tablet         TAKE these medications         acetaminophen 500 MG tablet   Commonly known as: TYLENOL   Take 1,000 mg by mouth every 6 (six) hours as needed. pain      amitriptyline 10 MG tablet   Commonly known as: ELAVIL   Take 20 mg by mouth at bedtime.      amLODipine 5 MG tablet   Commonly known as: NORVASC   Take 1 tablet (5 mg total) by mouth at bedtime.      carvedilol 6.25 MG tablet   Commonly known as: COREG   Take 6.25 mg by mouth 2 (two) times daily with a meal.      cinacalcet 60 MG tablet   Commonly known as: SENSIPAR   Take 60 mg by mouth 2 (two) times daily.      citalopram 20 MG tablet   Commonly known as: CELEXA   Take 20 mg by mouth daily.      hydrALAZINE 50 MG tablet   Commonly known as: APRESOLINE   Take 1 tablet (50 mg total) by mouth 2 (two) times daily at 10 AM and 5 PM.      lamiVUDine 150 MG tablet   Commonly known as: EPIVIR   Take 150 mg by mouth daily.      lidocaine-prilocaine cream   Commonly known as: EMLA   Apply 1 application topically as needed.      MELATONIN PO   Take 1 tablet by mouth at bedtime. 3 mg      multivitamin Tabs tablet   Take 1 tablet by mouth daily.      omeprazole 20 MG capsule   Commonly known as: PRILOSEC   Take 20 mg by mouth daily.      raltegravir 400 MG tablet   Commonly known as: ISENTRESS   Take 400 mg by mouth 2 (two) times daily.      sevelamer 800 MG tablet   Commonly known as: RENVELA   Take 3 tablets (2,400 mg total) by mouth 3 (three) times daily with meals.      tenofovir 300 MG tablet   Commonly known as: VIREAD   Take 300 mg by mouth once a week.           Disposition: Discharged to Home in  good condition.   Discharge Vital Signs and Labs: Temp:  [97.3 F (36.3 C)-98.3 F (36.8 C)] 97.8 F (36.6 C) (08/06 1048) Pulse Rate:  [73-94] 90  (08/06 1048) Resp:  [15-20] 16  (08/06 1048) BP: (115-153)/(61-86) 120/63 mmHg (08/06 1048) SpO2:  [93 %-99 %] 99 % (08/06 1048) Weight:  [88.3 kg (  194 lb 10.7 oz)-104.2 kg (229 lb 11.5 oz)] 101.3 kg (223 lb 5.2 oz) (08/06 1048)  Basename 05/14/12 0656  WBC 7.0  HGB 9.5*  HCT 27.3*  PLT 244   Renal:  Basename 05/14/12 0656  NA 132*  K 3.9  CL 91*  CO2 23  GLUCOSE 88  BUN 45*  CREATININE 9.60*  CALCIUM 7.9*  PHOS 5.0*  ALBUMIN 3.5   60 minutes were spent completing this discharge summary, discharge med reconciliation, discharge patient instructions and communicating discharge information to the patient's dialysis center.  Signed Sheffield Slider, PA-C [facility] [phone] 05/14/2012, 12:54 PM  Attending Physician:  Dr. Annie Sable

## 2012-05-14 NOTE — Procedures (Signed)
Patient was seen on dialysis and the procedure was supervised.  BFR 400  Via AVF BP is  115/70.   Patient appears to be tolerating treatment well  [name] A 05/14/2012

## 2012-05-17 NOTE — Discharge Summary (Signed)
Patient was cared for by me and I agree with this discharge summary.

## 2012-07-31 DIAGNOSIS — B20 Human immunodeficiency virus [HIV] disease: Secondary | ICD-10-CM

## 2012-09-11 ENCOUNTER — Observation Stay (HOSPITAL_COMMUNITY)
Admission: EM | Admit: 2012-09-11 | Discharge: 2012-09-11 | Disposition: A | Discharge status: Home / Self Care | Payer: Medicare Other | Attending: Emergency Medicine | Admitting: Emergency Medicine

## 2012-09-11 ENCOUNTER — Emergency Department (HOSPITAL_COMMUNITY): Payer: Medicare Other

## 2012-09-11 DIAGNOSIS — Y9289 Other specified places as the place of occurrence of the external cause: Secondary | ICD-10-CM | POA: Insufficient documentation

## 2012-09-11 DIAGNOSIS — S79919A Unspecified injury of unspecified hip, initial encounter: Principal | ICD-10-CM | POA: Insufficient documentation

## 2012-09-11 DIAGNOSIS — M25561 Pain in right knee: Secondary | ICD-10-CM

## 2012-09-11 DIAGNOSIS — N186 End stage renal disease: Secondary | ICD-10-CM | POA: Insufficient documentation

## 2012-09-11 DIAGNOSIS — I120 Hypertensive chronic kidney disease with stage 5 chronic kidney disease or end stage renal disease: Secondary | ICD-10-CM | POA: Insufficient documentation

## 2012-09-11 DIAGNOSIS — F17200 Nicotine dependence, unspecified, uncomplicated: Secondary | ICD-10-CM | POA: Insufficient documentation

## 2012-09-11 DIAGNOSIS — S79929A Unspecified injury of unspecified thigh, initial encounter: Secondary | ICD-10-CM | POA: Insufficient documentation

## 2012-09-11 DIAGNOSIS — W19XXXA Unspecified fall, initial encounter: Secondary | ICD-10-CM

## 2012-09-11 DIAGNOSIS — B20 Human immunodeficiency virus [HIV] disease: Secondary | ICD-10-CM

## 2012-09-11 DIAGNOSIS — Y9389 Activity, other specified: Secondary | ICD-10-CM | POA: Insufficient documentation

## 2012-09-11 DIAGNOSIS — Z79899 Other long term (current) drug therapy: Secondary | ICD-10-CM | POA: Insufficient documentation

## 2012-09-11 DIAGNOSIS — R296 Repeated falls: Secondary | ICD-10-CM | POA: Insufficient documentation

## 2012-09-11 DIAGNOSIS — Z760 Encounter for issue of repeat prescription: Secondary | ICD-10-CM | POA: Insufficient documentation

## 2012-09-11 LAB — BASIC METABOLIC PANEL
Chloride: 93 mEq/L — ABNORMAL LOW (ref 96–112)
GFR calc Af Amer: 7 mL/min — ABNORMAL LOW (ref >90)
GFR calc non Af Amer: 6 mL/min — ABNORMAL LOW (ref >90)
Potassium: 4.3 mEq/L (ref 3.5–5.1)
Sodium: 137 mEq/L (ref 135–145)

## 2012-09-11 LAB — CBC WITH DIFFERENTIAL/PLATELET
Basophils Relative: 1 % (ref 0–1)
Eosinophils Absolute: 0.1 10*3/uL (ref 0.0–0.7)
Hemoglobin: 8.9 g/dL — ABNORMAL LOW (ref 12.0–15.0)
MCH: 35.3 pg — ABNORMAL HIGH (ref 26.0–34.0)
MCHC: 32.1 g/dL (ref 30.0–36.0)
Neutro Abs: 2.6 10*3/uL (ref 1.7–7.7)
Neutrophils Relative %: 42 % — ABNORMAL LOW (ref 43–77)
Platelets: 294 10*3/uL (ref 150–400)
RBC: 2.52 MIL/uL — ABNORMAL LOW (ref 3.87–5.11)

## 2012-09-11 MED ORDER — HYDROCODONE-ACETAMINOPHEN 5-325 MG PO TABS
1.0000 | ORAL_TABLET | Freq: Once | ORAL | Status: AC
  Administered 2012-09-11: 1 via ORAL
  Filled 2012-09-11: qty 1

## 2012-09-11 MED ORDER — CINACALCET HCL 60 MG PO TABS: 60.0000 mg | ORAL_TABLET | Freq: Two times a day (BID) | ORAL | Status: DC

## 2012-09-11 MED ORDER — LAMIVUDINE 10 MG/ML PO SOLN: 25.0000 mg | Freq: Two times a day (BID) | ORAL | Status: DC

## 2012-09-11 MED ORDER — TENOFOVIR DISOPROXIL FUMARATE 300 MG PO TABS: 300.0000 mg | ORAL_TABLET | ORAL | Status: DC

## 2012-09-11 MED ORDER — RALTEGRAVIR POTASSIUM 400 MG PO TABS: 400.0000 mg | ORAL_TABLET | Freq: Two times a day (BID) | ORAL | Status: DC

## 2012-09-11 MED ORDER — CARVEDILOL 6.25 MG PO TABS: 6.2500 mg | ORAL_TABLET | Freq: Two times a day (BID) | ORAL | Status: DC

## 2012-09-11 MED ORDER — SEVELAMER CARBONATE 800 MG PO TABS: 2400.0000 mg | ORAL_TABLET | Freq: Three times a day (TID) | ORAL | Status: DC

## 2012-09-11 MED ORDER — AMLODIPINE BESYLATE 5 MG PO TABS: 5.0000 mg | ORAL_TABLET | Freq: Every day | ORAL | Status: DC

## 2012-09-11 NOTE — ED Notes (Signed)
Spoke with Jody with Social Work regarding transportation for home, will come see pt around 530. RN made aware.

## 2012-09-11 NOTE — ED Notes (Signed)
Patient requested to speak with case manager with assistance of home medication and assistance at home.

## 2012-09-11 NOTE — ED Notes (Signed)
Social Work here to see pt
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
## 2012-09-11 NOTE — Progress Notes (Signed)
09/11/12-1602-J.Peace Jost,RN,BSN [phone]      Spoke with Dannial Monarch at ID clinic at [facility] [phone]). Explained need for referral to [facility] ID clinic. Referral form faxed to CSW at [phone]. [place] given to Alan,RN in CDU.

## 2012-09-11 NOTE — ED Provider Notes (Signed)
Medical screening examination/treatment/procedure(s) were performed by non-physician practitioner and as supervising physician I was immediately available for consultation/collaboration.  Shandee Jergens T Dimitra [place], MD 09/11/12 1519 

## 2012-09-11 NOTE — ED Notes (Signed)
Per EMS, pt c/o generalized weakness and has fall x2 today. Pt reports she is weak left hip to knee. She has been schedule for right knee replacement. Pt reports she is HIV positive and out of meds x1 month. Per EMS CBG 120, BP 110/80, HR 96, O2 98% on room air. Pt states had dialysis yesterday.

## 2012-09-11 NOTE — Progress Notes (Signed)
   CARE MANAGEMENT ED NOTE 09/11/2012  Patient:  [name], [name]   Account Number:  [id number]  Date Initiated:  09/11/2012  Documentation initiated by:  Fransico Michael  Subjective/Objective Assessment:   presented to ED with c/o weakness     Subjective/Objective Assessment Detail:     Action/Plan:   Action/Plan Detail:   Anticipated DC Date:  09/11/2012     Status Recommendation to Physician:   Result of Recommendation:      DC Planning Services  CM consult    Choice offered to / List presented to:            Status of service:  Completed, signed off  ED Comments:   ED Comments Detail:  09/11/12-1303-J.Quinn Bartling,RN,BSN [phone]       Patient presented to ED today with c/o weakness. Requested CM visit. In to speak with paitent. Voiced concerns regarding establishing an ID doctor with [facility] and lack of transportation to doctor's appointment. Called [employer] of social services at [phone]. Instructions given to patient and put on AVS on how to get established. Also phoned the [facility] ID clinic who reported that patient would have to be referred as a patient from the ID clinic at [facility] or through [facility] health department. RNCM phoned both to question process for referral. Awaiting return calls. Patient notified that CM,health department, or ID clinic will get in touch with patient regarding appointment.

## 2012-10-03 ENCOUNTER — Encounter (HOSPITAL_COMMUNITY)
Admission: EM | Disposition: A | Discharge status: Home / Self Care | Payer: Self-pay | Source: Home / Self Care | Attending: Emergency Medicine

## 2012-10-03 ENCOUNTER — Emergency Department (HOSPITAL_COMMUNITY): Payer: Medicare Other

## 2012-10-03 ENCOUNTER — Encounter (HOSPITAL_COMMUNITY): Payer: Self-pay | Admitting: *Deleted

## 2012-10-03 ENCOUNTER — Emergency Department (HOSPITAL_COMMUNITY)
Admission: EM | Admit: 2012-10-03 | Discharge: 2012-10-03 | Disposition: A | Discharge status: Home / Self Care | Payer: Medicare Other | Attending: Emergency Medicine | Admitting: Emergency Medicine

## 2012-10-03 DIAGNOSIS — F17200 Nicotine dependence, unspecified, uncomplicated: Secondary | ICD-10-CM | POA: Insufficient documentation

## 2012-10-03 DIAGNOSIS — E8770 Fluid overload, unspecified: Secondary | ICD-10-CM

## 2012-10-03 DIAGNOSIS — E875 Hyperkalemia: Secondary | ICD-10-CM

## 2012-10-03 DIAGNOSIS — I120 Hypertensive chronic kidney disease with stage 5 chronic kidney disease or end stage renal disease: Secondary | ICD-10-CM | POA: Insufficient documentation

## 2012-10-03 DIAGNOSIS — N186 End stage renal disease: Secondary | ICD-10-CM | POA: Insufficient documentation

## 2012-10-03 DIAGNOSIS — Z21 Asymptomatic human immunodeficiency virus [HIV] infection status: Secondary | ICD-10-CM | POA: Insufficient documentation

## 2012-10-03 DIAGNOSIS — R0602 Shortness of breath: Secondary | ICD-10-CM | POA: Insufficient documentation

## 2012-10-03 DIAGNOSIS — Z992 Dependence on renal dialysis: Secondary | ICD-10-CM | POA: Insufficient documentation

## 2012-10-03 DIAGNOSIS — Z79899 Other long term (current) drug therapy: Secondary | ICD-10-CM | POA: Insufficient documentation

## 2012-10-03 DIAGNOSIS — I509 Heart failure, unspecified: Secondary | ICD-10-CM | POA: Insufficient documentation

## 2012-10-03 LAB — BASIC METABOLIC PANEL
CO2: 19 mEq/L (ref 19–32)
Calcium: 8.4 mg/dL (ref 8.4–10.5)
Potassium: 5.9 mEq/L — ABNORMAL HIGH (ref 3.5–5.1)
Sodium: 135 mEq/L (ref 135–145)

## 2012-10-03 LAB — CBC WITH DIFFERENTIAL/PLATELET
Basophils Relative: 0 % (ref 0–1)
Eosinophils Absolute: 0.1 10*3/uL (ref 0.0–0.7)
Eosinophils Relative: 1 % (ref 0–5)
HCT: 29.4 % — ABNORMAL LOW (ref 36.0–46.0)
Hemoglobin: 9.5 g/dL — ABNORMAL LOW (ref 12.0–15.0)
Lymphocytes Relative: 34 % (ref 12–46)
Monocytes Relative: 9 % (ref 3–12)
Neutrophils Relative %: 56 % (ref 43–77)
RBC: 2.66 MIL/uL — ABNORMAL LOW (ref 3.87–5.11)
WBC: 8.6 10*3/uL (ref 4.0–10.5)

## 2012-10-03 SURGERY — LEFT HEART CATHETERIZATION WITH CORONARY ANGIOGRAM
Anesthesia: LOCAL

## 2012-10-03 MED ORDER — SODIUM BICARBONATE 8.4 % IV SOLN: 50.0000 meq | Freq: Once | INTRAVENOUS | Status: DC

## 2012-10-03 MED ORDER — DEXTROSE 50 % IV SOLN: 50.0000 mL | Freq: Once | INTRAVENOUS | Status: DC

## 2012-10-03 MED ORDER — INSULIN ASPART 100 UNIT/ML ~~LOC~~ SOLN: 10.0000 [IU] | Freq: Once | SUBCUTANEOUS | Status: DC

## 2012-10-03 MED ORDER — SODIUM POLYSTYRENE SULFONATE 15 GM/60ML PO SUSP
45.0000 g | Freq: Once | ORAL | Status: AC
  Administered 2012-10-03: 45 g via ORAL
  Filled 2012-10-03: qty 180

## 2012-10-03 MED ORDER — ONDANSETRON 4 MG PO TBDP
4.0000 mg | ORAL_TABLET | Freq: Once | ORAL | Status: AC
  Administered 2012-10-03: 4 mg via ORAL
  Filled 2012-10-03: qty 1

## 2012-10-03 NOTE — ED Notes (Signed)
NAD noted at time of d/c home with [facility]

## 2012-10-03 NOTE — ED Notes (Signed)
Pt will be returning to [address] [place], [place] via [place].

## 2012-10-03 NOTE — ED Notes (Signed)
MD at bedside. 

## 2012-10-03 NOTE — ED Provider Notes (Signed)
History     CSN: [id number]  Arrival date & time 10/03/12  1255   First MD Initiated Contact with Patient 10/03/12 1330      Chief Complaint  Patient presents with  . Edema    (Consider location/radiation/quality/duration/timing/severity/associated sxs/prior treatment) HPI Pt is normally dialyzed T/Th/Sa and last HD was Monday. Supposed to be dialyzed in [place] today but stayed overnight in [place] due to not having heat in house in [place]. Pt states she did not have a way to get to [facility] and called EMS for transport. They brought pt to [facility] for eval. Pt complains of very mild SOB and diffuse swelling. Denies pain, fever, chills.  Past Medical History  Diagnosis Date  . Hypertension   . HIV infection   . Chronic renal failure   . CHF (congestive heart failure)   . ESRD (end stage renal disease)     Past Surgical History  Procedure Date  . Arteriovenous graft placement     No family history on file.  History  Substance Use Topics  . Smoking status: Current Every Day Smoker  . Smokeless tobacco: Not on file  . Alcohol Use: Yes    OB History    Grav Para Term Preterm Abortions TAB SAB Ect Mult Living                  Review of Systems  Constitutional: Negative for fever and chills.  Respiratory: Positive for shortness of breath. Negative for cough and wheezing.   Cardiovascular: Negative for chest pain and palpitations.  Gastrointestinal: Negative for nausea, vomiting and abdominal pain.  Musculoskeletal: Negative for back pain.  Neurological: Negative for dizziness, weakness and numbness.  All other systems reviewed and are negative.    Allergies  Morphine and related  Home Medications   Current Outpatient Rx  Name  Route  Sig  Dispense  Refill  . AMITRIPTYLINE HCL 10 MG PO TABS   Oral   Take 20 mg by mouth at bedtime.         Marland Kitchen AMLODIPINE BESYLATE 5 MG PO TABS   Oral   Take 1 tablet (5 mg total) by mouth at bedtime.   30 tablet   0   .  CARVEDILOL 6.25 MG PO TABS   Oral   Take 1 tablet (6.25 mg total) by mouth 2 (two) times daily with a meal.   60 tablet   0   . CINACALCET HCL 60 MG PO TABS   Oral   Take 1 tablet (60 mg total) by mouth 2 (two) times daily.   60 tablet   0   . HYDRALAZINE HCL 50 MG PO TABS   Oral   Take 1 tablet (50 mg total) by mouth 2 (two) times daily at 10 AM and 5 PM.         . LAMIVUDINE 10 MG/ML PO SOLN   Oral   Take 2.5 mLs (25 mg total) by mouth 2 (two) times daily.   240 mL   0   . RENA-VITE PO TABS   Oral   Take 1 tablet by mouth daily.         Marland Kitchen OMEPRAZOLE 20 MG PO CPDR   Oral   Take 20 mg by mouth daily.         Marland Kitchen RALTEGRAVIR POTASSIUM 400 MG PO TABS   Oral   Take 1 tablet (400 mg total) by mouth 2 (two) times daily.   30 tablet  0   . SEVELAMER CARBONATE 800 MG PO TABS   Oral   Take 3 tablets (2,400 mg total) by mouth 3 (three) times daily with meals.   90 tablet   0   . TENOFOVIR DISOPROXIL FUMARATE 300 MG PO TABS   Oral   Take 1 tablet (300 mg total) by mouth once a week. Friday   4 tablet   0     BP 155/86  Pulse 101  Temp 98.7 F (37.1 C) (Oral)  Resp 24  Ht 5\' 4"  (1.626 m)  Wt 235 lb (106.595 kg)  BMI 40.34 kg/m2  SpO2 100%  Physical Exam  Nursing note and vitals reviewed. Constitutional: She is oriented to person, place, and time. She appears well-developed and well-nourished. No distress.  HENT:  Head: Normocephalic and atraumatic.  Mouth/Throat: Oropharynx is clear and moist.  Eyes: EOM are normal. Pupils are equal, round, and reactive to light.  Neck: Normal range of motion. Neck supple.  Cardiovascular: Normal rate and regular rhythm.   Pulmonary/Chest: Effort normal and breath sounds normal. No respiratory distress. She has no wheezes. She has no rales.  Abdominal: Soft. Bowel sounds are normal. She exhibits no mass. There is no tenderness. There is no rebound and no guarding.  Musculoskeletal: Normal range of motion. She exhibits  no edema and no tenderness.       Palp thrill L forearm. No def pitting edema  Neurological: She is alert and oriented to person, place, and time.  Skin: Skin is warm and dry. No rash noted. No erythema.  Psychiatric: She has a normal mood and affect. Her behavior is normal.    ED Course  Procedures (including critical care time)  Labs Reviewed  CBC WITH DIFFERENTIAL - Abnormal; Notable for the following:    RBC 2.66 (*)     Hemoglobin 9.5 (*)     HCT 29.4 (*)     MCV 110.5 (*)     MCH 35.7 (*)     All other components within normal limits  BASIC METABOLIC PANEL - Abnormal; Notable for the following:    Potassium 5.9 (*)     Chloride 92 (*)     Glucose, Bld 134 (*)     BUN 72 (*)     Creatinine, Ser 12.72 (*)     GFR calc non Af Amer 3 (*)     GFR calc Af Amer 3 (*)     All other components within normal limits   Dg Chest 2 View  10/03/2012  *RADIOLOGY REPORT*  Clinical Data: Shortness of breath.  CHEST - 2 VIEW  Comparison: Single view of the chest 09/09/2012.  Findings: Lungs are clear.  Mild vascular congestion without frank edema noted.  Heart size normal.  No pneumothorax or pleural effusion.  IMPRESSION: No acute finding.   Original Report Authenticated By: Holley Dexter, M.D.      1. Hyperkalemia   2. Fluid overload      Date: 10/03/2012  Rate: 99  Rhythm: normal sinus rhythm  QRS Axis: normal  Intervals: normal  ST/T Wave abnormalities: normal  Conduction Disutrbances:none  Narrative Interpretation:   Old EKG Reviewed: none available    MDM  Will screen and arrange transport for HD Unable to get pt's Dialysis center on the phone.  Discussed with Dr Dederding. He advised Kayexalate and f/u in the AM for HD.  Pt states she will have transportation for HD in the morning.      Onalee Hua  Ranae Palms, MD 10/03/12 [phone]

## 2012-10-03 NOTE — ED Notes (Signed)
IV attempt x 1 unsuccessful. IV team paged. Pt going to CDU

## 2012-10-03 NOTE — ED Notes (Addendum)
ems reports patient called for transport,  She is due to dialysis today..  150/90,  Hr 102, 18, 96 percent on room air,  Patient states her heat stopped working last night.  She is staying with her niece.  She states she just didn't have a ride to her dialysis today and thus called ems to transport.  Patient is swollen in her face and in her extremities.  She denies sob at this time

## 2012-12-11 ENCOUNTER — Emergency Department (HOSPITAL_COMMUNITY): Payer: Medicare Other

## 2012-12-11 ENCOUNTER — Encounter (HOSPITAL_COMMUNITY): Payer: Self-pay | Admitting: *Deleted

## 2012-12-11 ENCOUNTER — Inpatient Hospital Stay (HOSPITAL_COMMUNITY): Payer: Medicare Other

## 2012-12-11 ENCOUNTER — Inpatient Hospital Stay (HOSPITAL_COMMUNITY)
Admission: EM | Admit: 2012-12-11 | Discharge: 2012-12-13 | DRG: 640 | Disposition: A | Discharge status: Home Health | Payer: Medicare Other | Attending: Internal Medicine | Admitting: Internal Medicine

## 2012-12-11 DIAGNOSIS — E872 Acidosis, unspecified: Secondary | ICD-10-CM | POA: Diagnosis present

## 2012-12-11 DIAGNOSIS — Z79899 Other long term (current) drug therapy: Secondary | ICD-10-CM

## 2012-12-11 DIAGNOSIS — Z91199 Patient's noncompliance with other medical treatment and regimen due to unspecified reason: Secondary | ICD-10-CM | POA: Diagnosis present

## 2012-12-11 DIAGNOSIS — I10 Essential (primary) hypertension: Secondary | ICD-10-CM

## 2012-12-11 DIAGNOSIS — W19XXXA Unspecified fall, initial encounter: Secondary | ICD-10-CM

## 2012-12-11 DIAGNOSIS — M25551 Pain in right hip: Secondary | ICD-10-CM

## 2012-12-11 DIAGNOSIS — N2581 Secondary hyperparathyroidism of renal origin: Secondary | ICD-10-CM | POA: Diagnosis present

## 2012-12-11 DIAGNOSIS — I447 Left bundle-branch block, unspecified: Secondary | ICD-10-CM | POA: Diagnosis present

## 2012-12-11 DIAGNOSIS — M25561 Pain in right knee: Secondary | ICD-10-CM

## 2012-12-11 DIAGNOSIS — N039 Chronic nephritic syndrome with unspecified morphologic changes: Secondary | ICD-10-CM | POA: Diagnosis present

## 2012-12-11 DIAGNOSIS — I120 Hypertensive chronic kidney disease with stage 5 chronic kidney disease or end stage renal disease: Secondary | ICD-10-CM | POA: Diagnosis present

## 2012-12-11 DIAGNOSIS — Z6841 Body Mass Index (BMI) 40.0 and over, adult: Secondary | ICD-10-CM

## 2012-12-11 DIAGNOSIS — Z21 Asymptomatic human immunodeficiency virus [HIV] infection status: Secondary | ICD-10-CM

## 2012-12-11 DIAGNOSIS — D631 Anemia in chronic kidney disease: Secondary | ICD-10-CM | POA: Diagnosis present

## 2012-12-11 DIAGNOSIS — E669 Obesity, unspecified: Secondary | ICD-10-CM | POA: Diagnosis present

## 2012-12-11 DIAGNOSIS — E1169 Type 2 diabetes mellitus with other specified complication: Secondary | ICD-10-CM | POA: Diagnosis present

## 2012-12-11 DIAGNOSIS — M1710 Unilateral primary osteoarthritis, unspecified knee: Secondary | ICD-10-CM | POA: Diagnosis present

## 2012-12-11 DIAGNOSIS — R21 Rash and other nonspecific skin eruption: Secondary | ICD-10-CM | POA: Diagnosis present

## 2012-12-11 DIAGNOSIS — E875 Hyperkalemia: Principal | ICD-10-CM

## 2012-12-11 DIAGNOSIS — Z9115 Patient's noncompliance with renal dialysis: Secondary | ICD-10-CM

## 2012-12-11 DIAGNOSIS — M25569 Pain in unspecified knee: Secondary | ICD-10-CM

## 2012-12-11 DIAGNOSIS — B20 Human immunodeficiency virus [HIV] disease: Secondary | ICD-10-CM | POA: Diagnosis present

## 2012-12-11 DIAGNOSIS — D649 Anemia, unspecified: Secondary | ICD-10-CM | POA: Diagnosis present

## 2012-12-11 DIAGNOSIS — Z91158 Patient's noncompliance with renal dialysis for other reason: Secondary | ICD-10-CM

## 2012-12-11 DIAGNOSIS — N189 Chronic kidney disease, unspecified: Secondary | ICD-10-CM

## 2012-12-11 DIAGNOSIS — Z9119 Patient's noncompliance with other medical treatment and regimen: Secondary | ICD-10-CM | POA: Diagnosis present

## 2012-12-11 DIAGNOSIS — Z992 Dependence on renal dialysis: Secondary | ICD-10-CM

## 2012-12-11 DIAGNOSIS — Y92009 Unspecified place in unspecified non-institutional (private) residence as the place of occurrence of the external cause: Secondary | ICD-10-CM

## 2012-12-11 DIAGNOSIS — I509 Heart failure, unspecified: Secondary | ICD-10-CM

## 2012-12-11 DIAGNOSIS — N186 End stage renal disease: Secondary | ICD-10-CM

## 2012-12-11 DIAGNOSIS — R55 Syncope and collapse: Secondary | ICD-10-CM

## 2012-12-11 DIAGNOSIS — F17200 Nicotine dependence, unspecified, uncomplicated: Secondary | ICD-10-CM | POA: Diagnosis present

## 2012-12-11 DIAGNOSIS — F1410 Cocaine abuse, uncomplicated: Secondary | ICD-10-CM | POA: Diagnosis present

## 2012-12-11 DIAGNOSIS — I440 Atrioventricular block, first degree: Secondary | ICD-10-CM

## 2012-12-11 DIAGNOSIS — Z720 Tobacco use: Secondary | ICD-10-CM

## 2012-12-11 DIAGNOSIS — M25562 Pain in left knee: Secondary | ICD-10-CM | POA: Diagnosis present

## 2012-12-11 LAB — CBC WITH DIFFERENTIAL/PLATELET
Eosinophils Relative: 2 % (ref 0–5)
Hemoglobin: 11.1 g/dL — ABNORMAL LOW (ref 12.0–15.0)
Lymphocytes Relative: 48 % — ABNORMAL HIGH (ref 12–46)
Lymphs Abs: 3.2 10*3/uL (ref 0.7–4.0)
MCV: 101.9 fL — ABNORMAL HIGH (ref 78.0–100.0)
Monocytes Relative: 7 % (ref 3–12)
Neutrophils Relative %: 42 % — ABNORMAL LOW (ref 43–77)
Platelets: 280 10*3/uL (ref 150–400)
RBC: 3.2 MIL/uL — ABNORMAL LOW (ref 3.87–5.11)
WBC: 6.8 10*3/uL (ref 4.0–10.5)

## 2012-12-11 LAB — CK TOTAL AND CKMB (NOT AT ARMC): Total CK: 62 U/L (ref 7–177)

## 2012-12-11 LAB — BASIC METABOLIC PANEL
Calcium: 9.6 mg/dL (ref 8.4–10.5)
Creatinine, Ser: 13.54 mg/dL — ABNORMAL HIGH (ref 0.50–1.10)
GFR calc Af Amer: 3 mL/min — ABNORMAL LOW (ref >90)
GFR calc non Af Amer: 3 mL/min — ABNORMAL LOW (ref >90)

## 2012-12-11 LAB — GLUCOSE, CAPILLARY
Glucose-Capillary: 99 mg/dL (ref 70–99)
Glucose-Capillary: 174 mg/dL — ABNORMAL HIGH (ref 70–99)

## 2012-12-11 MED ORDER — AMLODIPINE BESYLATE 10 MG PO TABS
10.0000 mg | ORAL_TABLET | Freq: Every day | ORAL | Status: DC
  Administered 2012-12-11 – 2012-12-12 (×2): 10 mg via ORAL
  Filled 2012-12-11 (×3): qty 1

## 2012-12-11 MED ORDER — SEVELAMER CARBONATE 800 MG PO TABS
800.0000 mg | ORAL_TABLET | Freq: Three times a day (TID) | ORAL | Status: DC
  Administered 2012-12-11 – 2012-12-12 (×3): 800 mg via ORAL
  Filled 2012-12-11 (×8): qty 1

## 2012-12-11 MED ORDER — DOCUSATE SODIUM 100 MG PO CAPS
100.0000 mg | ORAL_CAPSULE | Freq: Two times a day (BID) | ORAL | Status: DC
  Administered 2012-12-11 – 2012-12-12 (×3): 100 mg via ORAL
  Filled 2012-12-11 (×5): qty 1

## 2012-12-11 MED ORDER — SODIUM CHLORIDE 0.9 % IV SOLN: 1.0000 g | Freq: Once | INTRAVENOUS | Status: DC

## 2012-12-11 MED ORDER — AMITRIPTYLINE HCL 10 MG PO TABS
20.0000 mg | ORAL_TABLET | Freq: Every day | ORAL | Status: DC
  Administered 2012-12-11 – 2012-12-12 (×2): 20 mg via ORAL
  Filled 2012-12-11 (×3): qty 2

## 2012-12-11 MED ORDER — INSULIN REGULAR HUMAN 100 UNIT/ML IJ SOLN: 6.0000 [IU] | Freq: Once | INTRAMUSCULAR | Status: DC

## 2012-12-11 MED ORDER — TENOFOVIR DISOPROXIL FUMARATE 300 MG PO TABS
300.0000 mg | ORAL_TABLET | ORAL | Status: DC
  Filled 2012-12-11: qty 1

## 2012-12-11 MED ORDER — HYDROCODONE-ACETAMINOPHEN 5-325 MG PO TABS
2.0000 | ORAL_TABLET | Freq: Once | ORAL | Status: AC
  Administered 2012-12-11: 2 via ORAL
  Filled 2012-12-11: qty 2

## 2012-12-11 MED ORDER — DEXTROSE 50 % IV SOLN
1.0000 | Freq: Once | INTRAVENOUS | Status: DC
  Filled 2012-12-11: qty 50

## 2012-12-11 MED ORDER — CINACALCET HCL 30 MG PO TABS
60.0000 mg | ORAL_TABLET | Freq: Every day | ORAL | Status: DC
  Administered 2012-12-12: 60 mg via ORAL
  Filled 2012-12-11 (×4): qty 2

## 2012-12-11 MED ORDER — ASPIRIN 81 MG PO CHEW
324.0000 mg | CHEWABLE_TABLET | Freq: Once | ORAL | Status: AC
  Administered 2012-12-11: 324 mg via ORAL
  Filled 2012-12-11: qty 4

## 2012-12-11 MED ORDER — SODIUM CHLORIDE 0.9 % IJ SOLN: 3.0000 mL | Freq: Two times a day (BID) | INTRAMUSCULAR | Status: DC

## 2012-12-11 MED ORDER — HEPARIN SODIUM (PORCINE) 1000 UNIT/ML DIALYSIS
20.0000 [IU]/kg | INTRAMUSCULAR | Status: DC | PRN
  Filled 2012-12-11: qty 3

## 2012-12-11 MED ORDER — LIDOCAINE-PRILOCAINE 2.5-2.5 % EX CREA: 1.0000 "application " | TOPICAL_CREAM | CUTANEOUS | Status: DC | PRN

## 2012-12-11 MED ORDER — SODIUM CHLORIDE 0.9 % IV BOLUS (SEPSIS): 1000.0000 mL | Freq: Once | INTRAVENOUS | Status: DC

## 2012-12-11 MED ORDER — HEPARIN SODIUM (PORCINE) 1000 UNIT/ML DIALYSIS
1000.0000 [IU] | INTRAMUSCULAR | Status: DC | PRN
  Filled 2012-12-11: qty 1

## 2012-12-11 MED ORDER — LAMIVUDINE 10 MG/ML PO SOLN
50.0000 mg | Freq: Every day | ORAL | Status: DC
  Administered 2012-12-12 (×2): 50 mg via ORAL
  Filled 2012-12-11 (×3): qty 5

## 2012-12-11 MED ORDER — CALCIUM GLUCONATE 10 % IV SOLN
INTRAVENOUS | Status: AC
  Filled 2012-12-11: qty 10

## 2012-12-11 MED ORDER — ONDANSETRON 4 MG PO TBDP
8.0000 mg | ORAL_TABLET | Freq: Once | ORAL | Status: AC
  Administered 2012-12-11: 8 mg via ORAL

## 2012-12-11 MED ORDER — SODIUM CHLORIDE 0.9 % IV SOLN
INTRAVENOUS | Status: AC
  Filled 2012-12-11: qty 100

## 2012-12-11 MED ORDER — ALTEPLASE 2 MG IJ SOLR: 2.0000 mg | Freq: Once | INTRAMUSCULAR | Status: DC | PRN

## 2012-12-11 MED ORDER — ONDANSETRON HCL 4 MG PO TABS
4.0000 mg | ORAL_TABLET | Freq: Four times a day (QID) | ORAL | Status: DC | PRN
  Filled 2012-12-11: qty 0.5

## 2012-12-11 MED ORDER — ALBUTEROL SULFATE (5 MG/ML) 0.5% IN NEBU: 2.5000 mg | INHALATION_SOLUTION | RESPIRATORY_TRACT | Status: DC | PRN

## 2012-12-11 MED ORDER — HYDROMORPHONE HCL PF 1 MG/ML IJ SOLN
0.5000 mg | Freq: Once | INTRAMUSCULAR | Status: DC
  Filled 2012-12-11: qty 1

## 2012-12-11 MED ORDER — CARVEDILOL 6.25 MG PO TABS
6.2500 mg | ORAL_TABLET | Freq: Two times a day (BID) | ORAL | Status: DC
  Administered 2012-12-11 – 2012-12-12 (×3): 6.25 mg via ORAL
  Filled 2012-12-11 (×6): qty 1

## 2012-12-11 MED ORDER — SODIUM BICARBONATE 8.4 % IV SOLN
50.0000 meq | Freq: Once | INTRAVENOUS | Status: DC
  Filled 2012-12-11: qty 50

## 2012-12-11 MED ORDER — CITALOPRAM HYDROBROMIDE 20 MG PO TABS
20.0000 mg | ORAL_TABLET | Freq: Every day | ORAL | Status: DC
  Administered 2012-12-12 (×2): 20 mg via ORAL
  Filled 2012-12-11 (×4): qty 1

## 2012-12-11 MED ORDER — SODIUM BICARBONATE 650 MG PO TABS
1300.0000 mg | ORAL_TABLET | Freq: Two times a day (BID) | ORAL | Status: DC
  Administered 2012-12-11 – 2012-12-12 (×3): 1300 mg via ORAL
  Filled 2012-12-11 (×5): qty 2

## 2012-12-11 MED ORDER — NEPRO/CARBSTEADY PO LIQD: 237.0000 mL | ORAL | Status: DC | PRN

## 2012-12-11 MED ORDER — RALTEGRAVIR POTASSIUM 400 MG PO TABS
400.0000 mg | ORAL_TABLET | Freq: Two times a day (BID) | ORAL | Status: DC
  Administered 2012-12-11 – 2012-12-12 (×3): 400 mg via ORAL
  Filled 2012-12-11 (×5): qty 1

## 2012-12-11 MED ORDER — ONDANSETRON 4 MG PO TBDP
ORAL_TABLET | ORAL | Status: AC
  Filled 2012-12-11: qty 2

## 2012-12-11 MED ORDER — SODIUM CHLORIDE 0.9 % IV SOLN: 100.0000 mL | INTRAVENOUS | Status: DC | PRN

## 2012-12-11 MED ORDER — ACETAMINOPHEN 325 MG PO TABS
650.0000 mg | ORAL_TABLET | Freq: Four times a day (QID) | ORAL | Status: DC | PRN
  Administered 2012-12-12: 650 mg via ORAL
  Filled 2012-12-11: qty 2

## 2012-12-11 MED ORDER — ASPIRIN EC 81 MG PO TBEC
81.0000 mg | DELAYED_RELEASE_TABLET | Freq: Every day | ORAL | Status: DC
  Administered 2012-12-12: 81 mg via ORAL
  Filled 2012-12-11 (×2): qty 1

## 2012-12-11 MED ORDER — CINACALCET HCL 30 MG PO TABS
60.0000 mg | ORAL_TABLET | Freq: Every day | ORAL | Status: DC
Start: 2012-12-11 — End: 2012-12-11

## 2012-12-11 MED ORDER — PANTOPRAZOLE SODIUM 40 MG PO TBEC
40.0000 mg | DELAYED_RELEASE_TABLET | Freq: Every day | ORAL | Status: DC
  Administered 2012-12-11 – 2012-12-12 (×2): 40 mg via ORAL
  Filled 2012-12-11 (×2): qty 1

## 2012-12-11 MED ORDER — LIDOCAINE HCL (PF) 1 % IJ SOLN: 5.0000 mL | INTRAMUSCULAR | Status: DC | PRN

## 2012-12-11 MED ORDER — PENTAFLUOROPROP-TETRAFLUOROETH EX AERO: 1.0000 "application " | INHALATION_SPRAY | CUTANEOUS | Status: DC | PRN

## 2012-12-11 MED ORDER — ONDANSETRON HCL 4 MG/2ML IJ SOLN: 4.0000 mg | Freq: Four times a day (QID) | INTRAMUSCULAR | Status: DC | PRN

## 2012-12-11 MED ORDER — ACETAMINOPHEN 650 MG RE SUPP: 650.0000 mg | Freq: Four times a day (QID) | RECTAL | Status: DC | PRN

## 2012-12-11 MED ORDER — HYDROMORPHONE HCL PF 1 MG/ML IJ SOLN: 0.5000 mg | Freq: Once | INTRAMUSCULAR | Status: DC

## 2012-12-11 MED ORDER — LAMIVUDINE 150 MG PO TABS: 150.0000 mg | ORAL_TABLET | Freq: Every day | ORAL | Status: DC

## 2012-12-11 MED ORDER — HEPARIN SODIUM (PORCINE) 5000 UNIT/ML IJ SOLN
5000.0000 [IU] | Freq: Three times a day (TID) | INTRAMUSCULAR | Status: DC
  Administered 2012-12-11 – 2012-12-12 (×3): 5000 [IU] via SUBCUTANEOUS
  Filled 2012-12-11 (×8): qty 1

## 2012-12-11 MED ORDER — HYDRALAZINE HCL 50 MG PO TABS
100.0000 mg | ORAL_TABLET | Freq: Two times a day (BID) | ORAL | Status: DC
  Administered 2012-12-11 – 2012-12-12 (×4): 100 mg via ORAL
  Filled 2012-12-11 (×7): qty 2

## 2012-12-11 NOTE — H&P (Addendum)
[facility] History and Physical  Luanna Weesner XBJ:[id number] DOB: 04-03-1951 DOA: 12/11/2012  Referring physician: Marlon Pel, ED PA-C PCP: No primary Caedon Bond on file.  Dr. Orvan Falconer Specialists:  Nephrology.  Chief Complaint: Fall followed by a right greater than left knee pain,? Passed out  HPI: [name] is a 62 y.o. female with history of ESRD on HD, HIV, HTN, chronic CHF (unknown systolic or diastolic), OA of knees- declined for surgery secondary to CHF, presented to the ED with a history of knee pains, a fall & ? Passing out. Per nephrology records, patient not fully compliant with dialysis (misses HD or signs off early). Patient lives alone and on 3/4 apparently fell at home and hit her left knee. She thinks that she may have passed out for an undetermined amount of time but cannot be sure. She denied any chest pain, palpitations or headache. Home health RN apparently was able to lift her up. She complained of 10/10 pain in right knee, worse with movements but denied right hip pain and milder left knee pain. She missed dialysis yesterday because of her knee pains. In ED, patient found to have hyperkalemia with K >7.5, chest x-ray suggestive of pulmonary edema and EKG revealing first-degree AV block annual LBBB. She was emergently taken to HD. Patient seen on HD and indicates that she's feeling much better. Right knee pain now rated at 1/10   Review of Systems: All systems reviewed and apart from chronic bilateral knee pains, negative.  Past Medical History  Diagnosis Date  . Hypertension   . HIV infection   . Chronic renal failure   . CHF (congestive heart failure)   . ESRD (end stage renal disease)    Past Surgical History  Procedure Laterality Date  . Arteriovenous graft placement     Social History:  reports that she has been smoking.  She does not have any smokeless tobacco history on file. She reports that  drinks alcohol. She reports that she uses illicit  drugs (Cocaine). Lives alone. Independent of activities of daily living. Claims that she has not abused cocaine in over a month. Continues to smoke half pack of cigarettes per day. Claims to drink alcohol occasionally.  Allergies  Allergen Reactions  . Morphine And Related Hives and Rash    History reviewed. No pertinent family history. family history reviewed with patient and negative  Prior to Admission medications   Medication Sig Start Date End Date Taking? Authorizing Julian Askin  acetaminophen (TYLENOL) 500 MG tablet Take 1,000 mg by mouth every 6 (six) hours as needed for pain.   Yes Historical Monserat Prestigiacomo, MD  amitriptyline (ELAVIL) 10 MG tablet Take 20 mg by mouth at bedtime.   Yes Historical Oliver Heitzenrater, MD  amLODipine (NORVASC) 10 MG tablet Take 10 mg by mouth daily.   Yes Historical Simcha Farrington, MD  carvedilol (COREG) 6.25 MG tablet Take 6.25 mg by mouth 2 (two) times daily with a meal.   Yes Historical Mitchell Iwanicki, MD  cinacalcet (SENSIPAR) 60 MG tablet Take 60 mg by mouth daily.   Yes Historical Hervey Wedig, MD  citalopram (CELEXA) 20 MG tablet Take 20 mg by mouth daily.   Yes Historical Edmond Ginsberg, MD  docusate sodium (COLACE) 100 MG capsule Take 100 mg by mouth 2 (two) times daily.   Yes Historical Venetta Knee, MD  hydrALAZINE (APRESOLINE) 100 MG tablet Take 100 mg by mouth 2 (two) times daily.   Yes Historical Tait Balistreri, MD  ketorolac (TORADOL) 10 MG tablet Take 10 mg by mouth every  6 (six) hours as needed for pain.   Yes Historical Ethanjames Fontenot, MD  lamiVUDine (EPIVIR) 150 MG tablet Take 150 mg by mouth daily.   Yes Historical Payeton Germani, MD  omeprazole (PRILOSEC) 20 MG capsule Take 20 mg by mouth daily.   Yes Historical Mcdaniel Ohms, MD  oxyCODONE-acetaminophen (PERCOCET/ROXICET) 5-325 MG per tablet Take 1-2 tablets by mouth every 4 (four) hours as needed for pain.   Yes Historical Erman Thum, MD  raltegravir (ISENTRESS) 400 MG tablet Take 400 mg by mouth 2 (two) times daily.   Yes Historical Damani Kelemen, MD   sevelamer carbonate (RENVELA) 800 MG tablet Take 800 mg by mouth 3 (three) times daily with meals.   Yes Historical Berish Bohman, MD  sodium bicarbonate 650 MG tablet Take 1,300 mg by mouth 2 (two) times daily.   Yes Historical Nehemyah Foushee, MD  tenofovir (VIREAD) 300 MG tablet Take 300 mg by mouth once a week. Takes on Fridays.   Yes Historical Vanilla Heatherington, MD   Physical Exam: Filed Vitals:   12/11/12 1311 12/11/12 1313 12/11/12 1330 12/11/12 1400  BP: 165/65 150/84 194/116 191/111  Pulse: 63 66 102 103  Temp:      TempSrc:      Resp: 21 19 21 19   SpO2:         General exam: Moderately built and nourished female patient, lying comfortably supine on bed, undergoing HD.  Head, eyes and ENT: Nontraumatic and normocephalic. Pupils equally reacting to light and accommodation. Oral mucosa moist.  Neck: Supple. No JVD, carotid bruit or thyromegaly.  Lymphatics: No lymphadenopathy.  Respiratory system: Clear to auscultation. No increased work of breathing.  Cardiovascular system: S1 and S2 heard, RRR. No JVD, murmurs, gallops, clicks or pedal edema.  Gastrointestinal system: Abdomen is nondistended, soft and nontender. Normal bowel sounds heard. No organomegaly or masses appreciated.  Central nervous system: Alert and oriented. No focal neurological deficits.  Extremities: Symmetric 5 x 5 power. Peripheral pulses symmetrically felt. Bilateral knees seem chronically mildly swollen without any acute signs, right >left  Skin: No rashes or acute findings.  Musculoskeletal system: Negative exam.  Psychiatry: Pleasant and cooperative.   Labs on Admission:  Basic Metabolic Panel:  Recent Labs Lab 12/11/12 1055  NA 136  K >7.5*  CL 89*  CO2 17*  GLUCOSE 68*  BUN 98*  CREATININE 13.54*  CALCIUM 9.6   Liver Function Tests: No results found for this basename: AST, ALT, ALKPHOS, BILITOT, PROT, ALBUMIN,  in the last 168 hours No results found for this basename: LIPASE, AMYLASE,  in the  last 168 hours No results found for this basename: AMMONIA,  in the last 168 hours CBC:  Recent Labs Lab 12/11/12 1055  WBC 6.8  NEUTROABS 2.8  HGB 11.1*  HCT 32.6*  MCV 101.9*  PLT 280   Cardiac Enzymes:  Recent Labs Lab 12/11/12 1055  CKTOTAL 62  CKMB 3.0    BNP (last 3 results) No results found for this basename: PROBNP,  in the last 8760 hours CBG: No results found for this basename: GLUCAP,  in the last 168 hours  Radiological Exams on Admission: Dg Chest Port 1 View  12/11/2012  *RADIOLOGY REPORT*  Clinical Data: Cough, shortness of breath, vomiting.  PORTABLE CHEST - 1 VIEW  Comparison: 10/03/2012  Findings: Heart is borderline in size.  Mild vascular congestion. Interstitial prominence of the lungs has worsened since prior study.  Cannot exclude interstitial edema.  Mild prominence of the mediastinum, likely related to the portable semi-erect nature of  the study.  No confluent airspace opacities or effusions. No acute bony abnormality.  IMPRESSION: Mild cardiomegaly, vascular congestion and worsening interstitial prominence, question interstitial edema.   Original Report Authenticated By: Charlett Nose, M.D.     EKG: Independently reviewed. Sinus rhythm at 91 beats per minute with first degree AV block, normal axis, LBBB (new) and peaked T waves.  Assessment/Plan Principal Problem:   Hyperkalemia Active Problems:   HIV infection   ESRD (end stage renal disease)   Hypertension   Nicotine abuse   Anemia   1. Hyperkalemia/ESRD on HD/metabolic acidosis: Nephrology was consulted by EDP and patient being emergently dialyzed. Apparently did not get calcium gluconate, bicarbonate and insulin dextrose secondary to patient's refusal for IV access. Followup BMP post dialysis. Management per nephrology. 2. ? Syncope/fall: History is not very clear. Monitor on telemetry. We'll obtain 2-D echocardiogram and carotid Dopplers. 3. Knee pains right >left: likely secondary to cancer  osteoarthritis. Given history of trauma to left knee, will obtain x-rays of both knees to be sure that she does not have any fractures. 4. Uncontrolled hypertension: Offload volume at HD. Continue hydralazine, amlodipine and carvedilol. Monitor. 5. HIV infection: Continue home HAART. Outpatient followup at [facility]. 6. Chronic CHF: Unclear if systolic or diastolic. Offload volume at HD. Followup 2-D echocardiogram. 7. First degree AV block and new LBBB: Asymptomatic of chest pain.? Secondary to hyperkalemia. Follow 2-D echo and repeat EKG after completion of dialysis. 8. Polysubstance abuse-nicotine and cocaine. Cessation counseled. Patient denies alcohol abuse. Will monitor CIWA 9. Anemia: Secondary to chronic kidney disease. Monitor 10. Noncompliance with medical management: Counseled regarding importance of being compliant 11. Hypoglycemia on labs: check CBG stat.     Code Status: Full   Family Communication: Discussed with Patient   Disposition Plan: Home when stable   Time spent: 65 minutes  [facility] Pager [phone]  If 7PM-7AM, please contact night-coverage [email or web address] Password [email or web address] 12/11/2012, 2:22 PM

## 2012-12-11 NOTE — ED Notes (Signed)
Pt refused IV in CT for pain meds.  Dr Rhunette Croft notified.  IV team paged b/c pt states she is a hard stick.  Dr Rhunette Croft explained to pt the need for sl and pt accepted.

## 2012-12-11 NOTE — ED Notes (Signed)
[facility] paged per Dr. Rhunette Croft

## 2012-12-11 NOTE — Progress Notes (Signed)
Dr. Waymon Amato was page with EKG reports and BP readings.Order to keep monitoring BP during the night were received

## 2012-12-11 NOTE — Consult Note (Signed)
[facility] Renal Consultation Note    Indication for Consultation:  Management of ESRD/hemodialysis; anemia, hypertension/volume and secondary hyperparathyroidism  HPI: [name] is a 62 y.o. AA female with ESRD , HIV and HTN who presented to the ED with weakness and falls.  K found to be > 7.5.  Her last HD treatment was Saturday 3/01. She missed HD Tuesday and was due again today. Last month she had an admission to [facility] for a similar presentation after 2 missed treatments (K was 8.6) and she had admitted to smoking joints laced with cocaine. HIV is managed at [facility].  CD4 count was 597 with an undetectable viral load in January of this year.  Denies using any drugs recently.  Missed dialysis because I couldn't get out of the bed. Has some kind of home care assistance, but says they don't do anything. Signs off early because she doesn't want to miss her transportation. Requesting a graham cracker, because she hasn't eaten since last Sunday, but was given something to eat in the ED. On dialysis and feeling better. Arms and legs were previously aching. No CP no SOB, has "rash" in upper left chest that itches, but has no pain or burning. Appetite is good. Has right knee and left up pain.  Past Medical History  Diagnosis Date  . Hypertension   . HIV infection   . Chronic renal failure   . CHF (congestive heart failure)   . ESRD (end stage renal disease)    Past Surgical History  Procedure Laterality Date  . Arteriovenous graft placement     History reviewed. No pertinent family history. Social History:  reports that she has been smoking.  She does not have any smokeless tobacco history on file. She reports that  drinks alcohol. She reports that she does not use illicit drugs. Allergies  Allergen Reactions  . Morphine And Related Hives and Rash   Prior to Admission medications   Medication Sig Start Date End Date Taking? Authorizing Jai Bear   acetaminophen (TYLENOL) 500 MG tablet Take 1,000 mg by mouth every 6 (six) hours as needed for pain.   Yes Historical Trayven Lumadue, MD  amitriptyline (ELAVIL) 10 MG tablet Take 20 mg by mouth at bedtime.   Yes Historical Keyry Iracheta, MD  amLODipine (NORVASC) 10 MG tablet Take 10 mg by mouth daily.   Yes Historical Mairyn Lenahan, MD  carvedilol (COREG) 6.25 MG tablet Take 6.25 mg by mouth 2 (two) times daily with a meal.   Yes Historical Marwin Primmer, MD  cinacalcet (SENSIPAR) 60 MG tablet Take 60 mg by mouth daily.   Yes Historical Aminata Buffalo, MD  citalopram (CELEXA) 20 MG tablet Take 20 mg by mouth daily.   Yes Historical Leoda Smithhart, MD  docusate sodium (COLACE) 100 MG capsule Take 100 mg by mouth 2 (two) times daily.   Yes Historical Babyboy Loya, MD  hydrALAZINE (APRESOLINE) 100 MG tablet Take 100 mg by mouth 2 (two) times daily.   Yes Historical Fortune Torosian, MD  ketorolac (TORADOL) 10 MG tablet Take 10 mg by mouth every 6 (six) hours as needed for pain.   Yes Historical Daniqua Campoy, MD  lamiVUDine (EPIVIR) 150 MG tablet Take 150 mg by mouth daily.   Yes Historical Jalaiyah Throgmorton, MD  omeprazole (PRILOSEC) 20 MG capsule Take 20 mg by mouth daily.   Yes Historical Daaiel Starlin, MD  oxyCODONE-acetaminophen (PERCOCET/ROXICET) 5-325 MG per tablet Take 1-2 tablets by mouth every 4 (four) hours as needed for pain.   Yes Historical Anaisa Radi, MD  raltegravir (ISENTRESS) 400 MG tablet Take 400 mg by mouth 2 (two) times daily.   Yes Historical Canesha Tesfaye, MD  sevelamer carbonate (RENVELA) 800 MG tablet Take 800 mg by mouth 3 (three) times daily with meals.   Yes Historical Monasia Lair, MD  sodium bicarbonate 650 MG tablet Take 1,300 mg by mouth 2 (two) times daily.   Yes Historical Jakai Risse, MD  tenofovir (VIREAD) 300 MG tablet Take 300 mg by mouth once a week. Takes on Fridays.   Yes Historical Vendetta Pittinger, MD   Current Facility-Administered Medications  Medication Dose Route Frequency Wilmetta Speiser Last Rate Last Dose  . 0.9 %  sodium chloride infusion   100 mL Intravenous PRN Irena Cords, MD      . 0.9 %  sodium chloride infusion  100 mL Intravenous PRN Irena Cords, MD      . alteplase (CATHFLO ACTIVASE) injection 2 mg  2 mg Intracatheter Once PRN Irena Cords, MD      . calcium gluconate 1 g in sodium chloride 0.9 % 100 mL IVPB  1 g Intravenous Once Ankit Nanavati, MD      . calcium gluconate 10 % injection           . dextrose 50 % solution 50 mL  1 ampule Intravenous Once Ankit Nanavati, MD      . feeding supplement (NEPRO CARB STEADY) liquid 237 mL  237 mL Oral PRN Irena Cords, MD      . heparin injection 1,000 Units  1,000 Units Dialysis PRN Irena Cords, MD      . heparin injection 20 Units/kg  20 Units/kg Dialysis PRN Irena Cords, MD      . HYDROmorphone (DILAUDID) injection 0.5 mg  0.5 mg Intramuscular Once Tiffany G Greene, PA-C      . insulin regular (NOVOLIN R,HUMULIN R) 100 units/mL injection 6 Units  6 Units Intravenous Once Ankit Nanavati, MD      . lidocaine (PF) (XYLOCAINE) 1 % injection 5 mL  5 mL Intradermal PRN Irena Cords, MD      . lidocaine-prilocaine (EMLA) cream 1 application  1 application Topical PRN Irena Cords, MD      . pentafluoroprop-tetrafluoroeth (GEBAUERS) aerosol 1 application  1 application Topical PRN Irena Cords, MD      . sodium bicarbonate injection 50 mEq  50 mEq Intravenous Once Ankit Nanavati, MD      . sodium chloride 0.9 % infusion            Current Outpatient Prescriptions  Medication Sig Dispense Refill  . acetaminophen (TYLENOL) 500 MG tablet Take 1,000 mg by mouth every 6 (six) hours as needed for pain.      Marland Kitchen amitriptyline (ELAVIL) 10 MG tablet Take 20 mg by mouth at bedtime.      Marland Kitchen amLODipine (NORVASC) 10 MG tablet Take 10 mg by mouth daily.      . carvedilol (COREG) 6.25 MG tablet Take 6.25 mg by mouth 2 (two) times daily with a meal.      . cinacalcet (SENSIPAR) 60 MG tablet Take 60 mg by mouth daily.      . citalopram  (CELEXA) 20 MG tablet Take 20 mg by mouth daily.      Marland Kitchen docusate sodium (COLACE) 100 MG capsule Take 100 mg by mouth 2 (two) times daily.      . hydrALAZINE (APRESOLINE) 100 MG tablet Take 100 mg by mouth 2 (two) times daily.      Marland Kitchen  ketorolac (TORADOL) 10 MG tablet Take 10 mg by mouth every 6 (six) hours as needed for pain.      Marland Kitchen lamiVUDine (EPIVIR) 150 MG tablet Take 150 mg by mouth daily.      Marland Kitchen omeprazole (PRILOSEC) 20 MG capsule Take 20 mg by mouth daily.      Marland Kitchen oxyCODONE-acetaminophen (PERCOCET/ROXICET) 5-325 MG per tablet Take 1-2 tablets by mouth every 4 (four) hours as needed for pain.      . raltegravir (ISENTRESS) 400 MG tablet Take 400 mg by mouth 2 (two) times daily.      . sevelamer carbonate (RENVELA) 800 MG tablet Take 800 mg by mouth 3 (three) times daily with meals.      . sodium bicarbonate 650 MG tablet Take 1,300 mg by mouth 2 (two) times daily.      Marland Kitchen tenofovir (VIREAD) 300 MG tablet Take 300 mg by mouth once a week. Takes on Fridays.       Labs: Basic Metabolic Panel:  Recent Labs Lab 12/11/12 1055  NA 136  K >7.5*  CL 89*  CO2 17*  GLUCOSE 68*  BUN 98*  CREATININE 13.54*  CALCIUM 9.6   CBC:  Recent Labs Lab 12/11/12 1055  WBC 6.8  NEUTROABS 2.8  HGB 11.1*  HCT 32.6*  MCV 101.9*  PLT 280   Cardiac Enzymes:  Recent Labs Lab 12/11/12 1055  CKTOTAL 62  CKMB 3.0  Studies/Results: Dg Chest Port 1 View  12/11/2012  *RADIOLOGY REPORT*  Clinical Data: Cough, shortness of breath, vomiting.  PORTABLE CHEST - 1 VIEW  Comparison: 10/03/2012  Findings: Heart is borderline in size.  Mild vascular congestion. Interstitial prominence of the lungs has worsened since prior study.  Cannot exclude interstitial edema.  Mild prominence of the mediastinum, likely related to the portable semi-erect nature of the study.  No confluent airspace opacities or effusions. No acute bony abnormality.  IMPRESSION: Mild cardiomegaly, vascular congestion and worsening interstitial  prominence, question interstitial edema.   Original Report Authenticated By: Charlett Nose, M.D.    ROS: As per HPI otherwise neg Physical Exam: Filed Vitals:   12/11/12 1234 12/11/12 1300 12/11/12 1311 12/11/12 1313  BP: 157/88 158/77 165/65 150/84  Pulse: 78 74 63 66  Temp: 98.3 F (36.8 C) 97.1 F (36.2 C)    TempSrc: Oral Oral    Resp: 18 16 21 19   SpO2: 100% 100%       General: Well developed,obese, in no acute distress. Head: Normocephalic, atraumatic, sclera non-icteric, mucus membranes are moist Neck: Supple. JVD not elevated. Chest: 2 small separate 1-2 mm pustular like lesions on left upper chest - about 2 inches apart Lungs: Decreased bases with bilateral crackles. Breathing is unlabored. Heart: RRR no rub Abdomen: Obese. Soft, non-tender, non-distended with normoactive bowel sounds Lower extremities no LEt edema or ischemic changes, no open wounds  Neuro: Alert and oriented X 3. Moves all extremities spontaneously. Psych:  Responds to questions appropriately with a normal affect. Dialysis Access: left upper AVF Qb 400  Dialysis Orders: Center: [facility], TTS edw 104 Optiflux 180 2 K 2.5 Ca 3.5 hr 450/ autoflow; left upper AVF Most recent Hgb was 11.3 2/27 with Fe 120 and 50% - on Venofer listed as 3 x /week sat iPTH 1024 9January) and P of 11! - Ca 9.7  2/27- on hectorol 12  Assessment/Plan: 1. Hyperkalemia - due to missed treatments and habitually signing off early, telemetry; emergent HD - orders per Dr. Arrie Aran 2. ESRD -  URR ok, but delivered kt/vs are inadequate due to habitually signiing off early; she needs to change to a 2.25 Ca bath at d/c because she is taking sensipar to lower Ca; Increase goal to 4.5 and Qb 450 per orders received from her center; on chronic Na bicarb.  3. Hypertension/volume  - Interstitial edema on CXR;    continue meds; Norvasc 10, hydralazine 100 tid, decrease volume 4. Anemia  - off Epo; still getting Fe with high Fe levels; will d/c  venofer now and have advised the HD center to do the same. 5. Metabolic bone disease - PTH down to 1024 in January from 2400 previously on high dose hectorol; P now 11, much higher than prior 4.3 - 8 no doubt due to missed binders; recheck and hold hectorol until P decreases; supposed to be on 3 renvela ac and sensipar 60 - claims she has enough binders at home. 6. Nutrition - renal diet 7. HIV - [facility] managing care; 8. Medical and dialysis noncompliance - mostly related to substance abuse; discussed at length the risk of her signing off and missing dialysis and sudden death. 9. Polysubstance abuse - ongoing issue; check drug screen; long history - cocaine and alcohol; I would not give her narcotics for pain; NSAID only. 10. Bilateral knee pain - chronic, worsened by fall; fall may have been precipitated by muscle weakness from hyperkalemia 11. DM - diet controlled 12. Left upper chest "rash" - watch lesions; no prodromal sx of zoster and not close together.  Sheffield Slider, PA-C [facility] Beeper [phone] 12/11/2012, 1:22 PM   I have seen and examined this patient and agree with plan as outlined by Audree Bane, PA-C.  Will use 0K bath for 1 hour then 1 K bath for 2 hours then 2K bath for last hour of treatment.  Discussed increased risk for hospitalization and/or sudden death with nonadherence to HD.  Will also order toxicology screen. COLADONATO,JOSEPH A,MD 12/11/2012 3:20 PM

## 2012-12-11 NOTE — ED Provider Notes (Signed)
History     CSN: [id number]  Arrival date & time 12/11/12  1007   First MD Initiated Contact with Patient 12/11/12 1018      Chief Complaint  Patient presents with  . Knee Pain  . Fall    (Consider location/radiation/quality/duration/timing/severity/associated sxs/prior treatment) HPI  Dr. Kathrene Bongo  Pt to ED by EMS after syncopal episode yesterday for unknown cause. Unknown down time. She says that she has been unable to get around in the past week because her pain in the knees have been so severe, causing her to miss her dialysis. She denies having head pain but is unsure if she hit her neck or not. She complains mainly of severe right knee and right hip pains. She began to vomit after arriving to the ED. On EKG it is noted that she has a new onset first degree heart block. She denies taking her medications as she is supposed to in the back couple of days. She denies chest pain or shortness of breathing. BP is elevated at 182/99. Pt alert and oriented. Speaking in full intelligible sentences   Past Medical History  Diagnosis Date  . Hypertension   . HIV infection   . Chronic renal failure   . CHF (congestive heart failure)   . ESRD (end stage renal disease)     Past Surgical History  Procedure Laterality Date  . Arteriovenous graft placement      History reviewed. No pertinent family history.  History  Substance Use Topics  . Smoking status: Current Every Day Smoker  . Smokeless tobacco: Not on file  . Alcohol Use: Yes    OB History   Grav Para Term Preterm Abortions TAB SAB Ect Mult Living                  Review of Systems  All other systems reviewed and are negative.    Allergies  Morphine and related  Home Medications   Current Outpatient Rx  Name  Route  Sig  Dispense  Refill  . acetaminophen (TYLENOL) 500 MG tablet   Oral   Take 1,000 mg by mouth every 6 (six) hours as needed for pain.         Marland Kitchen amitriptyline (ELAVIL) 10 MG tablet    Oral   Take 20 mg by mouth at bedtime.         Marland Kitchen amLODipine (NORVASC) 10 MG tablet   Oral   Take 10 mg by mouth daily.         . carvedilol (COREG) 6.25 MG tablet   Oral   Take 6.25 mg by mouth 2 (two) times daily with a meal.         . cinacalcet (SENSIPAR) 60 MG tablet   Oral   Take 60 mg by mouth daily.         . citalopram (CELEXA) 20 MG tablet   Oral   Take 20 mg by mouth daily.         Marland Kitchen docusate sodium (COLACE) 100 MG capsule   Oral   Take 100 mg by mouth 2 (two) times daily.         . hydrALAZINE (APRESOLINE) 100 MG tablet   Oral   Take 100 mg by mouth 2 (two) times daily.         Marland Kitchen ketorolac (TORADOL) 10 MG tablet   Oral   Take 10 mg by mouth every 6 (six) hours as needed for pain.         Marland Kitchen  lamiVUDine (EPIVIR) 150 MG tablet   Oral   Take 150 mg by mouth daily.         Marland Kitchen omeprazole (PRILOSEC) 20 MG capsule   Oral   Take 20 mg by mouth daily.         Marland Kitchen oxyCODONE-acetaminophen (PERCOCET/ROXICET) 5-325 MG per tablet   Oral   Take 1-2 tablets by mouth every 4 (four) hours as needed for pain.         . raltegravir (ISENTRESS) 400 MG tablet   Oral   Take 400 mg by mouth 2 (two) times daily.         . sevelamer carbonate (RENVELA) 800 MG tablet   Oral   Take 800 mg by mouth 3 (three) times daily with meals.         . sodium bicarbonate 650 MG tablet   Oral   Take 1,300 mg by mouth 2 (two) times daily.         Marland Kitchen tenofovir (VIREAD) 300 MG tablet   Oral   Take 300 mg by mouth once a week. Takes on Fridays.           BP 157/88  Pulse 78  Temp(Src) 98.3 F (36.8 C) (Oral)  Resp 18  SpO2 100%  Physical Exam  Nursing note and vitals reviewed. Constitutional: She appears well-developed and well-nourished. No distress.  HENT:  Head: Normocephalic and atraumatic.  Eyes: Pupils are equal, round, and reactive to light.  Neck: Normal range of motion. Neck supple.  Cardiovascular: Normal rate.   Pulmonary/Chest: No  respiratory distress. She has no wheezes.  Abdominal: Soft.  Musculoskeletal:       Left hip: She exhibits decreased strength and tenderness. She exhibits normal range of motion, no bony tenderness, no swelling, no crepitus, no deformity and no laceration.       Legs: Neurological: She is alert.  Skin: Skin is warm and dry.    ED Course  Procedures (including critical care time)  Labs Reviewed  CBC WITH DIFFERENTIAL - Abnormal; Notable for the following:    RBC 3.20 (*)    Hemoglobin 11.1 (*)    HCT 32.6 (*)    MCV 101.9 (*)    MCH 34.7 (*)    Neutrophils Relative 42 (*)    Lymphocytes Relative 48 (*)    All other components within normal limits  BASIC METABOLIC PANEL - Abnormal; Notable for the following:    Potassium >7.5 (*)    Chloride 89 (*)    CO2 17 (*)    Glucose, Bld 68 (*)    BUN 98 (*)    Creatinine, Ser 13.54 (*)    GFR calc non Af Amer 3 (*)    GFR calc Af Amer 3 (*)    All other components within normal limits  CK TOTAL AND CKMB  POCT I-STAT TROPONIN I   Dg Chest Port 1 View  12/11/2012  *RADIOLOGY REPORT*  Clinical Data: Cough, shortness of breath, vomiting.  PORTABLE CHEST - 1 VIEW  Comparison: 10/03/2012  Findings: Heart is borderline in size.  Mild vascular congestion. Interstitial prominence of the lungs has worsened since prior study.  Cannot exclude interstitial edema.  Mild prominence of the mediastinum, likely related to the portable semi-erect nature of the study.  No confluent airspace opacities or effusions. No acute bony abnormality.  IMPRESSION: Mild cardiomegaly, vascular congestion and worsening interstitial prominence, question interstitial edema.   Original Report Authenticated By: Charlett Nose, M.D.  1. Hyperkalemia   2. First degree heart block   3. ESRD (end stage renal disease) on dialysis   4. Syncope   5. Knee pain, acute, right   6. Hip pain, acute, right       MDM  12:24pm- delay in obtain IV access because patient refuses.  She has been made aware that her potassium is too high for the monitor to read and that her heart is in an abnormal rhythm because of her potassium > 17.5. She has been advised that if she refuses IV and medications to treat her elevated potassium then she will most likely die without any intervention. She agrees to obtain IV so long as IM pain medications are given.  Pt unable to tolerate images to evaluate for knee, hip and head CT.    Nephrology to emergently dialyze at this time. Will speak with hospital ist and get patient admitted to medicine.  Pt admitted to [facility]. Holding orders placed.    Dorthula Matas, PA-C 12/11/12 1510

## 2012-12-11 NOTE — ED Notes (Signed)
Pt arrived by [place] ems. Reports falling yesterday morning and having right knee pain and left shoulder pain since the fall. Missed dialysis yesterday. Per ems, pt was picked up at her family members house, not at her residence so has not taken meds, bp was 190/110 pta

## 2012-12-11 NOTE — Procedures (Signed)
Patient was seen on dialysis and the procedure was supervised. BFR 450 Via LAVF BP is 194/111.  Patient appears to be tolerating treatment well.

## 2012-12-11 NOTE — Progress Notes (Signed)
Received pt. From HEMO,pt. Is alert and oriented,pt. Is from home alone,uses a walker.keep monitoring pt. Closely and assessing her needs.

## 2012-12-12 ENCOUNTER — Encounter (HOSPITAL_COMMUNITY): Payer: Self-pay | Admitting: *Deleted

## 2012-12-12 ENCOUNTER — Inpatient Hospital Stay (HOSPITAL_COMMUNITY): Payer: Medicare Other

## 2012-12-12 DIAGNOSIS — R55 Syncope and collapse: Secondary | ICD-10-CM

## 2012-12-12 DIAGNOSIS — Z21 Asymptomatic human immunodeficiency virus [HIV] infection status: Secondary | ICD-10-CM

## 2012-12-12 DIAGNOSIS — N189 Chronic kidney disease, unspecified: Secondary | ICD-10-CM

## 2012-12-12 LAB — RENAL FUNCTION PANEL
Albumin: 3.6 g/dL (ref 3.5–5.2)
GFR calc Af Amer: 6 mL/min — ABNORMAL LOW (ref >90)
Phosphorus: 10.4 mg/dL — ABNORMAL HIGH (ref 2.3–4.6)
Potassium: 5 mEq/L (ref 3.5–5.1)
Sodium: 137 mEq/L (ref 135–145)

## 2012-12-12 LAB — CBC
MCV: 102.5 fL — ABNORMAL HIGH (ref 78.0–100.0)
Platelets: 256 10*3/uL (ref 150–400)
RDW: 13.2 % (ref 11.5–15.5)
WBC: 6.5 10*3/uL (ref 4.0–10.5)

## 2012-12-12 MED ORDER — HYDROCODONE-ACETAMINOPHEN 5-325 MG PO TABS
1.0000 | ORAL_TABLET | ORAL | Status: DC | PRN
  Administered 2012-12-12 (×3): 1 via ORAL
  Filled 2012-12-12 (×5): qty 1

## 2012-12-12 MED ORDER — TRAMADOL HCL 50 MG PO TABS
50.0000 mg | ORAL_TABLET | Freq: Once | ORAL | Status: AC
  Administered 2012-12-12: 50 mg via ORAL
  Filled 2012-12-12: qty 1

## 2012-12-12 MED ORDER — OXYCODONE-ACETAMINOPHEN 5-325 MG PO TABS: 1.0000 | ORAL_TABLET | ORAL | Status: DC | PRN

## 2012-12-12 MED ORDER — HYDROCODONE-ACETAMINOPHEN 5-325 MG PO TABS
1.0000 | ORAL_TABLET | Freq: Once | ORAL | Status: AC
  Administered 2012-12-12: 1 via ORAL
  Filled 2012-12-12: qty 1

## 2012-12-12 MED ORDER — ASPIRIN 81 MG PO TBEC: 81.0000 mg | DELAYED_RELEASE_TABLET | Freq: Every day | ORAL | Status: DC

## 2012-12-12 NOTE — Progress Notes (Addendum)
Called by dialysis RN. Staff pulling clots from left AVF. Still + bruit and thrill. IR unable to do fistulagram until Friday.  D/c delayed.  HD to follow fistulagram tomorrow.  Bard Herbert, PA-C 12/12/2012 12:18 pm

## 2012-12-12 NOTE — Progress Notes (Signed)
VASCULAR LAB PRELIMINARY  PRELIMINARY  PRELIMINARY  PRELIMINARY  Carotid Dopplers completed.    Preliminary report:  There is no ICA stenosis.  Vertebral artery flow is antegrade.  KANADY, CANDACE, RVT 12/12/2012, 9:47 AM

## 2012-12-12 NOTE — Progress Notes (Signed)
*  PRELIMINARY RESULTS* Echocardiogram 2D Echocardiogram has been performed.  [name] 12/12/2012, 9:22 AM

## 2012-12-12 NOTE — Discharge Summary (Addendum)
Physician Discharge Summary  [name] ZOX:[id number] DOB: 24-Jan-1951 DOA: 12/11/2012  PCP: No primary provider on file.  Admit date: 12/11/2012 Discharge date: 12/13/2012  Time spent: 30 minutes  Recommendations for Outpatient Follow-up:  1. Follow up with Ortho as an outpatient.  Discharge Diagnoses:  Principal Problem:   Hyperkalemia Active Problems:   HIV infection   ESRD (end stage renal disease)   Hypertension   Nicotine abuse   Anemia   Fall at home   Bilateral knee pain   Discharge Condition: stable  Diet recommendation: renal diet  Filed Weights   12/11/12 1751 12/11/12 2106 12/12/12 2133  Weight: 105.4 kg (232 lb 5.8 oz) 103.919 kg (229 lb 1.6 oz) 106.278 kg (234 lb 4.8 oz)    History of present illness:  62 y.o. female with history of ESRD on HD, HIV, HTN, chronic CHF (unknown systolic or diastolic), OA of knees- declined for surgery secondary to CHF, presented to the ED with a history of knee pains, a fall & ? Passing out. Per nephrology records, patient not fully compliant with dialysis (misses HD or signs off early). Patient lives alone and on 3/4 apparently fell at home and hit her left knee. She thinks that she may have passed out for an undetermined amount of time but cannot be sure. She denied any chest pain, palpitations or headache. Home health RN apparently was able to lift her up. She complained of 10/10 pain in right knee, worse with movements but denied right hip pain and milder left knee pain. She missed dialysis yesterday because of her knee pains. In ED, patient found to have hyperkalemia with K >7.5, chest x-ray suggestive of pulmonary edema and EKG revealing first-degree AV block annual LBBB. She was emergently taken to HD. Patient seen on HD and indicates that she's feeling much better. Right knee pain now rated at 1/10   Hospital Course:  Hyperkalemia  - s/p emergent HD 3.5.2014.  - K improved after HD 3.5.2014.  - HD again 3.6.2014    Mechanical Fall - unlikely did not LOC.  - relates her left knee gave out.  - carotid doppler 3.5.2014 no ICA stenosis.  - echo 3.6.2014 pending.  - no events on telemtry  - no focal deficits on physical exam.  - Cardiac markers negative.   Bilateral knee pain  - left knee x-ray showed : severe osteoarthitistis.  - x-ray of right knee showed no fracture. - narcotics for pain.  Anemia of chonic disease:  - per renal   ESRD (end stage renal disease)  - per renal TTS   HIV infection  - cont meds   Hypertension:  - controlled.   Procedures:  - carotid doppler 3.6.2014 no ICA stenosis.  (i.e. Studies not automatically included, echos, thoracentesis, etc; not x-rays)  Consultations:  renal  Discharge Exam: Filed Vitals:   12/12/12 1621 12/12/12 2133 12/13/12 0545 12/13/12 0955  BP: 134/74 176/94  134/60  Pulse: 79 80 79 91  Temp: 98.7 F (37.1 C) 98.4 F (36.9 C) 98.4 F (36.9 C) 98.5 F (36.9 C)  TempSrc:  Oral Oral   Resp: 20 19 18 17   Height:      Weight:  106.278 kg (234 lb 4.8 oz)    SpO2: 97% 98% 98% 96%   sEE PROGRESS NOTE. Discharge Instructions      Discharge Orders   Future Orders Complete By Expires     Diet - low sodium heart healthy  As directed  Diet - low sodium heart healthy  As directed     Increase activity slowly  As directed     Increase activity slowly  As directed         Medication List    STOP taking these medications       acetaminophen 500 MG tablet  Commonly known as:  TYLENOL      TAKE these medications       amitriptyline 10 MG tablet  Commonly known as:  ELAVIL  Take 20 mg by mouth at bedtime.     amLODipine 10 MG tablet  Commonly known as:  NORVASC  Take 10 mg by mouth daily.     aspirin 81 MG EC tablet  Take 1 tablet (81 mg total) by mouth daily.     carvedilol 6.25 MG tablet  Commonly known as:  COREG  Take 6.25 mg by mouth 2 (two) times daily with a meal.     cinacalcet 60 MG tablet  Commonly  known as:  SENSIPAR  Take 60 mg by mouth daily.     citalopram 20 MG tablet  Commonly known as:  CELEXA  Take 20 mg by mouth daily.     docusate sodium 100 MG capsule  Commonly known as:  COLACE  Take 100 mg by mouth 2 (two) times daily.     hydrALAZINE 100 MG tablet  Commonly known as:  APRESOLINE  Take 100 mg by mouth 2 (two) times daily.     ketorolac 10 MG tablet  Commonly known as:  TORADOL  Take 10 mg by mouth every 6 (six) hours as needed for pain.     lamiVUDine 150 MG tablet  Commonly known as:  EPIVIR  Take 150 mg by mouth daily.     omeprazole 20 MG capsule  Commonly known as:  PRILOSEC  Take 20 mg by mouth daily.     oxyCODONE-acetaminophen 5-325 MG per tablet  Commonly known as:  PERCOCET/ROXICET  Take 1-2 tablets by mouth every 4 (four) hours as needed for pain.     raltegravir 400 MG tablet  Commonly known as:  ISENTRESS  Take 400 mg by mouth 2 (two) times daily.     sevelamer carbonate 800 MG tablet  Commonly known as:  RENVELA  Take 800 mg by mouth 3 (three) times daily with meals.     sodium bicarbonate 650 MG tablet  Take 1,300 mg by mouth 2 (two) times daily.     tenofovir 300 MG tablet  Commonly known as:  VIREAD  Take 300 mg by mouth once a week. Takes on Fridays.          The results of significant diagnostics from this hospitalization (including imaging, microbiology, ancillary and laboratory) are listed below for reference.    Significant Diagnostic Studies: Ct Head Wo Contrast  12/11/2012  *RADIOLOGY REPORT*  Clinical Data: Syncope, head injury  CT HEAD WITHOUT CONTRAST  Technique:  Contiguous axial images were obtained from the base of the skull through the vertex without contrast.  Comparison: None.  Findings: No acute intracranial hemorrhage.  No focal mass lesion. No CT evidence of acute infarction.   No midline shift or mass effect.  No hydrocephalus.  Basilar cisterns are patent.  There is periventricular white matter  hypodensities.  There is subtle hypodensity within the left thalamus (image 14).   This appears present on comparison of 06/10/2011.  Paranasal sinuses and mastoid air cells are clear.  Orbits are normal.  Impression:  1.  No acute intracranial findings. 2.  Microvascular disease similar to prior.  3.  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   Original Report Authenticated By: Genevive Bi, M.D.    Dg Chest Port 1 View  12/11/2012  *RADIOLOGY REPORT*  Clinical Data: Cough, shortness of breath, vomiting.  PORTABLE CHEST - 1 VIEW  Comparison: 10/03/2012  Findings: Heart is borderline in size.  Mild vascular congestion. Interstitial prominence of the lungs has worsened since prior study.  Cannot exclude interstitial edema.  Mild prominence of the mediastinum, likely related to the portable semi-erect nature of the study.  No confluent airspace opacities or effusions. No acute bony abnormality.  IMPRESSION: Mild cardiomegaly, vascular congestion and worsening interstitial prominence, question interstitial edema.   Original Report Authenticated By: Charlett Nose, M.D.    Dg Knee Complete 4 Views Left  12/11/2012  *RADIOLOGY REPORT*  Clinical Data: Fall.  Anterior knee pain.  LEFT KNEE - COMPLETE 4+ VIEW  Comparison: Plain films 03/16/2011.  Findings: The patient has a small to moderate joint effusion.  No fracture is identified.  There is tricompartmental degenerative change appearing advanced in the medial and patellofemoral compartments.  IMPRESSION:  1.  Negative for fracture. 2.  Tricompartmental osteoarthritis and associated small to moderate joint effusion.   Original Report Authenticated By: Holley Dexter, M.D.     Microbiology: Recent Results (from the past 240 hour(s))  MRSA PCR SCREENING     Status: None   Collection Time    12/11/12  6:26 PM      Result Value Range Status   MRSA by PCR NEGATIVE  NEGATIVE Final   Comment:            The GeneXpert MRSA Assay (FDA     approved for  NASAL specimens     only), is one component of a     comprehensive MRSA colonization     surveillance program. It is not     intended to diagnose MRSA     infection nor to guide or     monitor treatment for     MRSA infections.     Labs: Basic Metabolic Panel:  Recent Labs Lab 12/11/12 1055 12/12/12 0540  NA 136 137  K >7.5* 5.0  CL 89* 93*  CO2 17* 26  GLUCOSE 68* 80  BUN 98* 39*  CREATININE 13.54* 7.72*  CALCIUM 9.6 10.3  PHOS  --  10.4*   Liver Function Tests:  Recent Labs Lab 12/12/12 0540  ALBUMIN 3.6   No results found for this basename: LIPASE, AMYLASE,  in the last 168 hours No results found for this basename: AMMONIA,  in the last 168 hours CBC:  Recent Labs Lab 12/11/12 1055 12/12/12 0540  WBC 6.8 6.5  NEUTROABS 2.8  --   HGB 11.1* 11.1*  HCT 32.6* 32.8*  MCV 101.9* 102.5*  PLT 280 256   Cardiac Enzymes:  Recent Labs Lab 12/11/12 1055  CKTOTAL 62  CKMB 3.0   BNP: BNP (last 3 results) No results found for this basename: PROBNP,  in the last 8760 hours CBG:  Recent Labs Lab 12/11/12 1538 12/11/12 1748  GLUCAP 99 174*     Signed:  FELIZ ORTIZ, Demetre Monaco  [facility] 12/13/2012, 1:23 PM

## 2012-12-12 NOTE — Progress Notes (Signed)
[facility] Progress Note  Subjective:   Wants different care agency for home. Wants someone to look at the lesion on her back she has had for 15 years. Left knee still hurts.  States right knee needs replacement Objective Filed Vitals:   12/11/12 1751 12/11/12 1853 12/11/12 2106 12/12/12 0553  BP:  159/90 154/88 142/83  Pulse:  90 97 93  Temp:  98.1 F (36.7 C) 98.8 F (37.1 C) 98.6 F (37 C)  TempSrc:  Oral Oral Oral  Resp:  18 19 18   Height:   5\' 4"  (1.626 m)   Weight: 105.4 kg (232 lb 5.8 oz)  103.919 kg (229 lb 1.6 oz)   SpO2:  98% 97% 94%   Physical Exam General: NAD Heart: RRR Lungs: no wheezes or rales (dark nodular area on right lower back - old boil 15 years) Abdomen: soft obese Extremities: obese, no overt edema; left knee tender on palpation Dialysis Access: left upper AVF   Dialysis Orders: Center: [place], TTS edw 104 Optiflux 180 2 K 2.5 Ca 3.5 hr 450/ autoflow; left upper AVF  Most recent Hgb was 11.3 2/27 with Fe 120 and 50% - on Venofer listed as 3 x /week sat iPTH 1024 9January) and P of 11! - Ca 9.7 2/27- on hectorol 12   Assessment/Plan:  1. Hyperkalemia - due to missed treatments and habitually signing off early, telemetry; emergent HD - orders per Dr. Arrie Aran K 5.0 today.  This is her usual HD day. Will do a treatment to get on schedule. 2. ESRD - TTS [facility] ok, but delivered kt/vs are inadequate due to habitually signiing off early; she needs to change to a 2.25 or 2 Ca bath at d/c due to hypercalcemia 3. Hypertension/volume - Interstitial edema on CXR; continue meds; Norvasc 10, hydralazine 100 tid, Net UF 4 L yestserday; I think EDW can be lowered further. 4. Anemia - Hgb stable at 11.1 off Epo; still getting Fe with high Fe levels; will d/c venofer now and have advised the HD center to do the same. 5. Metabolic bone disease - PTH down to 1024 in January from 2400 previously on high dose hectorol; P 10, much higher than prior 4.3 - 8  no doubt due to missed binders; recheck and hold hectorol until P decreases; supposed to be on 3 renvela ac and sensipar 60 - claims she has enough binders at home. 6. Nutrition - renal diet 7. HIV - [facility] managing care; 8. Medical and dialysis noncompliance - mostly related to substance abuse; discussed at length the risk of her signing off and missing dialysis and sudden death. 9. Polysubstance abuse - ongoing issue; check drug screen; long history - cocaine and alcohol; I would not give her narcotics for pain; NSAID only. 10. Bilateral knee pain - chronic, worsened by fall; fall may have been precipitated by muscle weakness from hyperkalemia; xray neg fx; sm-mod effusion       11. DM - diet controlled        12.  Right back lesion - refer to derm after d/c; outpt dialysis can arrange       13.  Disp - ? D/c post HD today    Sheffield Slider, PA-C [facility] Beeper [phone] 12/12/2012,8:47 AM  LOS: 1 day    Additional Objective Labs: Basic Metabolic Panel:  Recent Labs Lab 12/11/12 1055 12/12/12 0540  NA 136 137  K >7.5* 5.0  CL 89* 93*  CO2 17*  26  GLUCOSE 68* 80  BUN 98* 39*  CREATININE 13.54* 7.72*  CALCIUM 9.6 10.3  PHOS  --  10.4*   Liver Function Tests:  Recent Labs Lab 12/12/12 0540  ALBUMIN 3.6   No results found for this basename: LIPASE, AMYLASE,  in the last 168 hours CBC:  Recent Labs Lab 12/11/12 1055 12/12/12 0540  WBC 6.8 6.5  NEUTROABS 2.8  --   HGB 11.1* 11.1*  HCT 32.6* 32.8*  MCV 101.9* 102.5*  PLT 280 256   Blood Culture No results found for this basename: sdes, specrequest, cult, reptstatus    Cardiac Enzymes:  Recent Labs Lab 12/11/12 1055  CKTOTAL 62  CKMB 3.0   CBG:  Recent Labs Lab 12/11/12 1538 12/11/12 1748  GLUCAP 99 174*   Iron Studies: No results found for this basename: IRON, TIBC, TRANSFERRIN, FERRITIN,  in the last 72 hours @lablastinr3 @ Studies/Results: Ct Head Wo  Contrast  12/11/2012  *RADIOLOGY REPORT*  Clinical Data: Syncope, head injury  CT HEAD WITHOUT CONTRAST  Technique:  Contiguous axial images were obtained from the base of the skull through the vertex without contrast.  Comparison: None.  Findings: No acute intracranial hemorrhage.  No focal mass lesion. No CT evidence of acute infarction.   No midline shift or mass effect.  No hydrocephalus.  Basilar cisterns are patent.  There is periventricular white matter hypodensities.  There is subtle hypodensity within the left thalamus (image 14).   This appears present on comparison of 06/10/2011.  Paranasal sinuses and mastoid air cells are clear.  Orbits are normal.  Impression:  1.  No acute intracranial findings. 2.  Microvascular disease similar to prior.  3.  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   Original Report Authenticated By: Genevive Bi, M.D.    Dg Chest Port 1 View  12/11/2012  *RADIOLOGY REPORT*  Clinical Data: Cough, shortness of breath, vomiting.  PORTABLE CHEST - 1 VIEW  Comparison: 10/03/2012  Findings: Heart is borderline in size.  Mild vascular congestion. Interstitial prominence of the lungs has worsened since prior study.  Cannot exclude interstitial edema.  Mild prominence of the mediastinum, likely related to the portable semi-erect nature of the study.  No confluent airspace opacities or effusions. No acute bony abnormality.  IMPRESSION: Mild cardiomegaly, vascular congestion and worsening interstitial prominence, question interstitial edema.   Original Report Authenticated By: Charlett Nose, M.D.    Dg Knee Complete 4 Views Left  12/11/2012  *RADIOLOGY REPORT*  Clinical Data: Fall.  Anterior knee pain.  LEFT KNEE - COMPLETE 4+ VIEW  Comparison: Plain films 03/16/2011.  Findings: The patient has a small to moderate joint effusion.  No fracture is identified.  There is tricompartmental degenerative change appearing advanced in the medial and patellofemoral compartments.   IMPRESSION:  1.  Negative for fracture. 2.  Tricompartmental osteoarthritis and associated small to moderate joint effusion.   Original Report Authenticated By: Holley Dexter, M.D.    Medications:   . amitriptyline  20 mg Oral QHS  . amLODipine  10 mg Oral Daily  . aspirin EC  81 mg Oral Daily  . carvedilol  6.25 mg Oral BID WC  . cinacalcet  60 mg Oral Q breakfast  . citalopram  20 mg Oral Daily  . docusate sodium  100 mg Oral BID  . heparin  5,000 Units Subcutaneous Q8H  . hydrALAZINE  100 mg Oral BID  . lamiVUDine  50 mg Oral Daily  . pantoprazole  40 mg  Oral Daily  . raltegravir  400 mg Oral BID  . sevelamer carbonate  800 mg Oral TID WC  . sodium bicarbonate  1,300 mg Oral BID  . sodium chloride  3 mL Intravenous Q12H  . [START ON 12/13/2012] tenofovir  300 mg Oral Weekly      I have seen and examined this patient and agree with plan as outlined by Audree Bane, PA-C.  Will get back on schedule today for extra fluid removal and improve hyperkalemia. Ethelyn Cerniglia A,MD 12/12/2012 10:58 AM

## 2012-12-12 NOTE — Progress Notes (Signed)
Pt  Continues to  C/o  Knee pain,  Will  Notify  MD  For  Pain  Med.

## 2012-12-12 NOTE — ED Provider Notes (Signed)
Shared service with midlevel provider. I have personally seen and examined the patient, providing direct face to face care, presenting with the chief complaint of syncope, fall. Pt has ESRD on HD, missed several session. Labs showed hyper K. EKG shows new LBBB, 1st degree AV block. I ordered all hyper K meds, and spoke with nephrology for emergent dialysis. Nurses were unable to get iv access. i was planning on putting a central line or EJ - however, dialysis center was ready to accept the patient - and so we decided to send her up with cardiac monitoring for the definitive care at the dialysis center for her hyper K.  I  Have reviewed the nursing documentation on past medical history, family history, and social history.  CRITICAL CARE Performed by: Derwood Kaplan   Total critical care time: 30 minutes  Critical care time was exclusive of separately billable procedures and treating other patients.  Critical care was necessary to treat or prevent imminent or life-threatening deterioration.  Critical care was time spent personally by me on the following activities: development of treatment plan with patient and/or surrogate as well as nursing, discussions with consultants, evaluation of patient's response to treatment, examination of patient, obtaining history from patient or surrogate, ordering and performing treatments and interventions, ordering and review of laboratory studies, ordering and review of radiographic studies, pulse oximetry and re-evaluation of patient's condition.  Critical care for Hyperkalemia, with EKG changes that were concerning and times spent with consultants, hospitalists and patients to expedite care.   Derwood Kaplan, MD 12/12/12 1549

## 2012-12-12 NOTE — Progress Notes (Signed)
[facility] PROGRESS NOTE Interim History: 62 y.o. female with history of ESRD on HD, HIV, HTN, chronic CHF (unknown systolic or diastolic), OA of knees- declined for surgery secondary to CHF, presented to the ED with a history of knee pains, a fall & ? Passing out. Per nephrology records, patient not fully compliant with dialysis (misses HD or signs off early). Patient lives alone and on 3/4 apparently fell at home and hit her left knee. She thinks that she may have passed out for an undetermined amount of time but cannot be sure. She denied any chest pain, palpitations or headache. Home health RN apparently was able to lift her up. She complained of 10/10 pain in right knee, worse with movements but denied right hip pain and milder left knee pain. She missed dialysis yesterday because of her knee pains. In ED, patient found to have hyperkalemia with K >7.5, chest x-ray suggestive of pulmonary edema and EKG revealing first-degree AV block annual LBBB. She was emergently taken to HD. Patient seen on HD and indicates that she's feeling much better    Assessment/Plan:   *Hyperkalemia - s/p emergent HD. - K improved after HD, Now 5.0. - HD again 3.6.2014    Syncope - unlikely did not LOC. - relates her knee gave out. - carotid doppler 3.5.2014 no ICA stenosis. - echo 3.6.2014 pending. - no events on telemtry - no focal deficits on physical exam. -   Bilateral knee pain - left knee x-ray showed : severe osteoarthitistis. - carotid doppler 3.6.2014 no ICA stenosis. - echo 3.6.2014 pending. 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    Code Status: Full  Family Communication: Discussed with Patient  Disposition Plan: Home when stable     Consultants:  renal  Procedures: ECHO 3.6.2014:  Antibiotics:  none  HPI/Subjective: Now  complaining of right knee pain  Objective: Filed Vitals:   12/11/12 1853 12/11/12 2106 12/12/12 0553 12/12/12 1000  BP: 159/90 154/88 142/83 127/79  Pulse: 90 97 93 82  Temp: 98.1 F (36.7 C) 98.8 F (37.1 C) 98.6 F (37 C) 97.8 F (36.6 C)  TempSrc: Oral Oral Oral Axillary  Resp: 18 19 18 18   Height:  5\' 4"  (1.626 m)    Weight:  103.919 kg (229 lb 1.6 oz)    SpO2: 98% 97% 94% 97%    Intake/Output Summary (Last 24 hours) at 12/12/12 1025 Last data filed at 12/12/12 1000  Gross per 24 hour  Intake    120 ml  Output   4000 ml  Net  -3880 ml   Filed Weights   12/11/12 1751 12/11/12 2106  Weight: 105.4 kg (232 lb 5.8 oz) 103.919 kg (229 lb 1.6 oz)    Exam:  General: Alert, awake, oriented x3, in no acute distress.  HEENT: No bruits, no goiter.  Heart: Regular rate and rhythm, without murmurs, rubs, gallops.  Lungs: Good air movement, clear to auscultation  Abdomen: Soft, nontender, nondistended, positive bowel sounds.  Neuro: Grossly intact, nonfocal.   Data Reviewed: Basic Metabolic Panel:  Recent Labs Lab 12/11/12 1055 12/12/12 0540  NA 136 137  K >7.5* 5.0  CL 89* 93*  CO2 17* 26  GLUCOSE 68* 80  BUN 98* 39*  CREATININE 13.54* 7.72*  CALCIUM 9.6 10.3  PHOS  --  10.4*  Liver Function Tests:  Recent Labs Lab 12/12/12 0540  ALBUMIN 3.6   No results found for this basename: LIPASE, AMYLASE,  in the last 168 hours No results found for this basename: AMMONIA,  in the last 168 hours CBC:  Recent Labs Lab 12/11/12 1055 12/12/12 0540  WBC 6.8 6.5  NEUTROABS 2.8  --   HGB 11.1* 11.1*  HCT 32.6* 32.8*  MCV 101.9* 102.5*  PLT 280 256   Cardiac Enzymes:  Recent Labs Lab 12/11/12 1055  CKTOTAL 62  CKMB 3.0   BNP (last 3 results) No results found for this basename: PROBNP,  in the last 8760 hours CBG:  Recent Labs Lab 12/11/12 1538 12/11/12 1748  GLUCAP 99 174*    Recent Results (from the past 240 hour(s))  MRSA PCR SCREENING      Status: None   Collection Time    12/11/12  6:26 PM      Result Value Range Status   MRSA by PCR NEGATIVE  NEGATIVE Final   Comment:            The GeneXpert MRSA Assay (FDA     approved for NASAL specimens     only), is one component of a     comprehensive MRSA colonization     surveillance program. It is not     intended to diagnose MRSA     infection nor to guide or     monitor treatment for     MRSA infections.     Studies: Ct Head Wo Contrast  12/11/2012  *RADIOLOGY REPORT*  Clinical Data: Syncope, head injury  CT HEAD WITHOUT CONTRAST  Technique:  Contiguous axial images were obtained from the base of the skull through the vertex without contrast.  Comparison: None.  Findings: No acute intracranial hemorrhage.  No focal mass lesion. No CT evidence of acute infarction.   No midline shift or mass effect.  No hydrocephalus.  Basilar cisterns are patent.  There is periventricular white matter hypodensities.  There is subtle hypodensity within the left thalamus (image 14).   This appears present on comparison of 06/10/2011.  Paranasal sinuses and mastoid air cells are clear.  Orbits are normal.  Impression:  1.  No acute intracranial findings. 2.  Microvascular disease similar to prior.  3.  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   Original Report Authenticated By: Genevive Bi, M.D.    Dg Chest Port 1 View  12/11/2012  *RADIOLOGY REPORT*  Clinical Data: Cough, shortness of breath, vomiting.  PORTABLE CHEST - 1 VIEW  Comparison: 10/03/2012  Findings: Heart is borderline in size.  Mild vascular congestion. Interstitial prominence of the lungs has worsened since prior study.  Cannot exclude interstitial edema.  Mild prominence of the mediastinum, likely related to the portable semi-erect nature of the study.  No confluent airspace opacities or effusions. No acute bony abnormality.  IMPRESSION: Mild cardiomegaly, vascular congestion and worsening interstitial prominence, question  interstitial edema.   Original Report Authenticated By: Charlett Nose, M.D.    Dg Knee Complete 4 Views Left  12/11/2012  *RADIOLOGY REPORT*  Clinical Data: Fall.  Anterior knee pain.  LEFT KNEE - COMPLETE 4+ VIEW  Comparison: Plain films 03/16/2011.  Findings: The patient has a small to moderate joint effusion.  No fracture is identified.  There is tricompartmental degenerative change appearing advanced in the medial and patellofemoral compartments.  IMPRESSION:  1.  Negative for fracture. 2.  Tricompartmental osteoarthritis and associated small to moderate joint  effusion.   Original Report Authenticated By: Holley Dexter, M.D.     Scheduled Meds: . amitriptyline  20 mg Oral QHS  . amLODipine  10 mg Oral Daily  . aspirin EC  81 mg Oral Daily  . carvedilol  6.25 mg Oral BID WC  . cinacalcet  60 mg Oral Q breakfast  . citalopram  20 mg Oral Daily  . docusate sodium  100 mg Oral BID  . heparin  5,000 Units Subcutaneous Q8H  . hydrALAZINE  100 mg Oral BID  . lamiVUDine  50 mg Oral Daily  . pantoprazole  40 mg Oral Daily  . raltegravir  400 mg Oral BID  . sevelamer carbonate  800 mg Oral TID WC  . sodium bicarbonate  1,300 mg Oral BID  . sodium chloride  3 mL Intravenous Q12H  . [START ON 12/13/2012] tenofovir  300 mg Oral Weekly   Continuous Infusions:    Marinda Elk  [facility] Pager [phone]. If 8PM-8AM, please contact night-coverage at [email or web address], password [facility] 12/12/2012, 10:25 AM  LOS: 1 day

## 2012-12-12 NOTE — Progress Notes (Signed)
Patient  C/o  Knee pain   Medicated  With tylenol,  Continues to refuse  IV /nsl .  IV therapy  In to  Assess  But  Patient  Refuses.

## 2012-12-13 ENCOUNTER — Inpatient Hospital Stay (HOSPITAL_COMMUNITY): Payer: Medicare Other

## 2012-12-13 ENCOUNTER — Encounter (HOSPITAL_COMMUNITY): Payer: Self-pay | Admitting: Radiology

## 2012-12-13 LAB — RENAL FUNCTION PANEL
CO2: 25 mEq/L (ref 19–32)
Chloride: 84 mEq/L — ABNORMAL LOW (ref 96–112)
GFR calc Af Amer: 4 mL/min — ABNORMAL LOW (ref >90)
Glucose, Bld: 115 mg/dL — ABNORMAL HIGH (ref 70–99)
Phosphorus: 10.5 mg/dL — ABNORMAL HIGH (ref 2.3–4.6)
Potassium: 5.1 mEq/L (ref 3.5–5.1)
Sodium: 129 mEq/L — ABNORMAL LOW (ref 135–145)

## 2012-12-13 LAB — CBC
HCT: 29.5 % — ABNORMAL LOW (ref 36.0–46.0)
Hemoglobin: 10 g/dL — ABNORMAL LOW (ref 12.0–15.0)
RBC: 2.89 MIL/uL — ABNORMAL LOW (ref 3.87–5.11)

## 2012-12-13 MED ORDER — TRAMADOL HCL 50 MG PO TABS
50.0000 mg | ORAL_TABLET | Freq: Once | ORAL | Status: AC
Start: 2012-12-13 — End: 2012-12-13
  Administered 2012-12-13: 50 mg via ORAL
  Filled 2012-12-13: qty 1

## 2012-12-13 MED ORDER — IOHEXOL 300 MG/ML  SOLN
100.0000 mL | Freq: Once | INTRAMUSCULAR | Status: AC | PRN
  Administered 2012-12-13: 50 mL via INTRAVENOUS

## 2012-12-13 NOTE — Procedures (Signed)
Appropriately functioning left forearm native AVF without peripheral or central stenosis.  No intervention performed.

## 2012-12-13 NOTE — Progress Notes (Addendum)
[facility] PROGRESS NOTE Interim History: 63 y.o. female with history of ESRD on HD, HIV, HTN, chronic CHF (unknown systolic or diastolic), OA of knees- declined for surgery secondary to CHF, presented to the ED with a history of knee pains, a fall & ? Passing out. Per nephrology records, patient not fully compliant with dialysis (misses HD or signs off early). Patient lives alone and on 3/4 apparently fell at home and hit her left knee. She thinks that she may have passed out for an undetermined amount of time but cannot be sure. She denied any chest pain, palpitations or headache. Home health RN apparently was able to lift her up. She complained of 10/10 pain in right knee, worse with movements but denied right hip pain and milder left knee pain. She missed dialysis yesterday because of her knee pains. In ED, patient found to have hyperkalemia with K >7.5, chest x-ray suggestive of pulmonary edema and EKG revealing first-degree AV block annual LBBB. She was emergently taken to HD. Patient seen on HD and indicates that she's feeling much better    Assessment/Plan:   *Hyperkalemia - s/p emergent HD. - K improved after HD, Now 5.0. - HD again 3.6.2014, failed repeated on 3.7.2014.   Mechanical fall: - Did not LOC. - relates her knee gave out. - carotid doppler 3.5.2014 no ICA stenosis. - echo 3.6.2014 pending. - no events on telemtry - no focal deficits on physical exam. -   Bilateral knee pain - left knee x-ray showed : severe osteoarthitistis. - carotid doppler 3.6.2014 no ICA stenosis. - echo 3.6.2014: ejection fraction was in the range of 55% to 60% - x-ray of right knee: severe OA, no fractures.  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    Code Status: Full  Family Communication: Discussed with Patient  Disposition Plan: Home when stable      Consultants:  renal  Procedures: ECHO 3.6.2014:  Antibiotics:  none  HPI/Subjective: Pain controlled  Objective: Filed Vitals:   12/12/12 1300 12/12/12 1621 12/12/12 2133 12/13/12 0545  BP: 133/74 134/74 176/94   Pulse: 83 79 80 79  Temp:  98.7 F (37.1 C) 98.4 F (36.9 C) 98.4 F (36.9 C)  TempSrc:   Oral Oral  Resp: 19 20 19 18   Height:      Weight:   106.278 kg (234 lb 4.8 oz)   SpO2: 96% 97% 98% 98%    Intake/Output Summary (Last 24 hours) at 12/13/12 0926 Last data filed at 12/12/12 1829  Gross per 24 hour  Intake    360 ml  Output      0 ml  Net    360 ml   Filed Weights   12/11/12 1751 12/11/12 2106 12/12/12 2133  Weight: 105.4 kg (232 lb 5.8 oz) 103.919 kg (229 lb 1.6 oz) 106.278 kg (234 lb 4.8 oz)    Exam:  General: Alert, awake, oriented x3, in no acute distress.  HEENT: No bruits, no goiter.  Heart: Regular rate and rhythm, without murmurs, rubs, gallops.  Lungs: Good air movement, clear to auscultation  Abdomen: Soft, nontender, nondistended, positive bowel sounds.  Neuro: Grossly intact, nonfocal.   Data Reviewed: Basic Metabolic Panel:  Recent Labs Lab 12/11/12 1055 12/12/12 0540  NA 136 137  K >7.5* 5.0  CL 89* 93*  CO2 17* 26  GLUCOSE 68* 80  BUN  98* 39*  CREATININE 13.54* 7.72*  CALCIUM 9.6 10.3  PHOS  --  10.4*   Liver Function Tests:  Recent Labs Lab 12/12/12 0540  ALBUMIN 3.6   No results found for this basename: LIPASE, AMYLASE,  in the last 168 hours No results found for this basename: AMMONIA,  in the last 168 hours CBC:  Recent Labs Lab 12/11/12 1055 12/12/12 0540  WBC 6.8 6.5  NEUTROABS 2.8  --   HGB 11.1* 11.1*  HCT 32.6* 32.8*  MCV 101.9* 102.5*  PLT 280 256   Cardiac Enzymes:  Recent Labs Lab 12/11/12 1055  CKTOTAL 62  CKMB 3.0   BNP (last 3 results) No results found for this basename: PROBNP,  in the last 8760 hours CBG:  Recent Labs Lab 12/11/12 1538 12/11/12 1748  GLUCAP  99 174*    Recent Results (from the past 240 hour(s))  MRSA PCR SCREENING     Status: None   Collection Time    12/11/12  6:26 PM      Result Value Range Status   MRSA by PCR NEGATIVE  NEGATIVE Final   Comment:            The GeneXpert MRSA Assay (FDA     approved for NASAL specimens     only), is one component of a     comprehensive MRSA colonization     surveillance program. It is not     intended to diagnose MRSA     infection nor to guide or     monitor treatment for     MRSA infections.     Studies: Dg Knee 1-2 Views Right  12/12/2012  *RADIOLOGY REPORT*  Clinical Data: Chronic right knee pain.  RIGHT KNEE - 1-2 VIEW  Comparison: 10/12/2012.  Findings: No joint effusion or fracture.  Subchondral sclerosis and irregularity are seen in the lateral compartment.  Osteophytosis is greatest in the lateral compartment.  IMPRESSION: Degenerative changes affect primarily the lateral compartment.   Original Report Authenticated By: Leanna Battles, M.D.    Ct Head Wo Contrast  12/11/2012  *RADIOLOGY REPORT*  Clinical Data: Syncope, head injury  CT HEAD WITHOUT CONTRAST  Technique:  Contiguous axial images were obtained from the base of the skull through the vertex without contrast.  Comparison: None.  Findings: No acute intracranial hemorrhage.  No focal mass lesion. No CT evidence of acute infarction.   No midline shift or mass effect.  No hydrocephalus.  Basilar cisterns are patent.  There is periventricular white matter hypodensities.  There is subtle hypodensity within the left thalamus (image 14).   This appears present on comparison of 06/10/2011.  Paranasal sinuses and mastoid air cells are clear.  Orbits are normal.  Impression:  1.  No acute intracranial findings. 2.  Microvascular disease similar to prior.  3.  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   Original Report Authenticated By: Genevive Bi, M.D.    Dg Chest Port 1 View  12/11/2012  *RADIOLOGY REPORT*  Clinical Data:  Cough, shortness of breath, vomiting.  PORTABLE CHEST - 1 VIEW  Comparison: 10/03/2012  Findings: Heart is borderline in size.  Mild vascular congestion. Interstitial prominence of the lungs has worsened since prior study.  Cannot exclude interstitial edema.  Mild prominence of the mediastinum, likely related to the portable semi-erect nature of the study.  No confluent airspace opacities or effusions. No acute bony abnormality.  IMPRESSION: Mild cardiomegaly, vascular congestion and worsening interstitial prominence, question interstitial edema.  Original Report Authenticated By: Charlett Nose, M.D.    Dg Knee Complete 4 Views Left  12/11/2012  *RADIOLOGY REPORT*  Clinical Data: Fall.  Anterior knee pain.  LEFT KNEE - COMPLETE 4+ VIEW  Comparison: Plain films 03/16/2011.  Findings: The patient has a small to moderate joint effusion.  No fracture is identified.  There is tricompartmental degenerative change appearing advanced in the medial and patellofemoral compartments.  IMPRESSION:  1.  Negative for fracture. 2.  Tricompartmental osteoarthritis and associated small to moderate joint effusion.   Original Report Authenticated By: Holley Dexter, M.D.     Scheduled Meds: . amitriptyline  20 mg Oral QHS  . amLODipine  10 mg Oral Daily  . aspirin EC  81 mg Oral Daily  . carvedilol  6.25 mg Oral BID WC  . cinacalcet  60 mg Oral Q breakfast  . citalopram  20 mg Oral Daily  . docusate sodium  100 mg Oral BID  . heparin  5,000 Units Subcutaneous Q8H  . hydrALAZINE  100 mg Oral BID  . lamiVUDine  50 mg Oral Daily  . pantoprazole  40 mg Oral Daily  . raltegravir  400 mg Oral BID  . sevelamer carbonate  800 mg Oral TID WC  . sodium bicarbonate  1,300 mg Oral BID  . sodium chloride  3 mL Intravenous Q12H  . tenofovir  300 mg Oral Weekly   Continuous Infusions:    Marinda Elk  [facility] Pager [phone]. If 8PM-8AM, please contact night-coverage at [email or web address], password  [facility] 12/13/2012, 9:26 AM  LOS: 2 days

## 2012-12-13 NOTE — Progress Notes (Signed)
Patient ID: [name], female   DOB: January 26, 1951, 62 y.o.   MRN: [id number]  [facility] Progress Note    Subjective:   Pt without new complaints.   Objective:   BP 163/94  Pulse 89  Temp(Src) 98.2 F (36.8 C) (Oral)  Resp 18  Ht 5\' 4"  (1.626 m)  Wt 106.278 kg (234 lb 4.8 oz)  BMI 40.2 kg/m2  SpO2 97%  Intake/Output: I/O last 3 completed shifts: In: 360 [P.O.:360] Out: -    Intake/Output this shift:  Total I/O In: 100 [P.O.:100] Out: -  Weight change: 0.878 kg (1 lb 15 oz)  Physical Exam: Gen:WD WN AAF in NAD CVS:no rub Resp:minimal crackles at bases HYQ:MVHQIO Ext:L forearm AVF +T/B  Labs: BMET  Recent Labs Lab 12/11/12 1055 12/12/12 0540  NA 136 137  K >7.5* 5.0  CL 89* 93*  CO2 17* 26  GLUCOSE 68* 80  BUN 98* 39*  CREATININE 13.54* 7.72*  ALBUMIN  --  3.6  CALCIUM 9.6 10.3  PHOS  --  10.4*   CBC  Recent Labs Lab 12/11/12 1055 12/12/12 0540  WBC 6.8 6.5  NEUTROABS 2.8  --   HGB 11.1* 11.1*  HCT 32.6* 32.8*  MCV 101.9* 102.5*  PLT 280 256    @IMGRELPRIORS @ Medications:    . amitriptyline  20 mg Oral QHS  . amLODipine  10 mg Oral Daily  . aspirin EC  81 mg Oral Daily  . carvedilol  6.25 mg Oral BID WC  . cinacalcet  60 mg Oral Q breakfast  . citalopram  20 mg Oral Daily  . docusate sodium  100 mg Oral BID  . heparin  5,000 Units Subcutaneous Q8H  . hydrALAZINE  100 mg Oral BID  . lamiVUDine  50 mg Oral Daily  . pantoprazole  40 mg Oral Daily  . raltegravir  400 mg Oral BID  . sevelamer carbonate  800 mg Oral TID WC  . sodium bicarbonate  1,300 mg Oral BID  . sodium chloride  3 mL Intravenous Q12H  . tenofovir  300 mg Oral Weekly     Assessment/ Plan:   Dialysis Orders: Center: [facility], TTS edw 104 Optiflux 180 2 K 2.5 Ca 3.5 hr 450/ autoflow; left upper AVF Most recent Hgb was 11.3 2/27 with Fe 120 and 50% - on Venofer listed as 3 x /week sat iPTH 1024 9January) and P of 11! - Ca 9.7 2/27- on hectorol 12   Assessment/Plan:  1. Hyperkalemia - due to missed treatments and habitually signing off early, telemetry. resoved after urgent HDThis is her usual HD day. Will do a treatment to get on schedule. 2. Vascular access- s/p fistulogram today.  Was not clotted and likely was stuck in pseudoaneurysm.  Get back on schedule tomorrow. 3. ESRD - TTS [facility] ok, but delivered kt/vs are inadequate due to habitually signiing off early; she needs to change to a 2.25 or 2 Ca bath at d/c due to hypercalcemia 4. Hypertension/volume - Interstitial edema on CXR; continue meds; Norvasc 10, hydralazine 100 tid, Net UF 4 L yestserday; I think EDW can be lowered further. 5. Anemia - Hgb stable at 11.1 off Epo; still getting Fe with high Fe levels; will d/c venofer now and have advised the HD center to do the same. 6. Metabolic bone disease - PTH down to 1024 in January from 2400 previously on high dose hectorol; P 10, much higher than prior 4.3 - 8 no doubt due to  missed binders; recheck and hold hectorol until P decreases; supposed to be on 3 renvela ac and sensipar 60 - claims she has enough binders at home. 7. Nutrition - renal diet 8. HIV - [facility] managing care; 9. Medical and dialysis noncompliance - mostly related to substance abuse; discussed at length the risk of her signing off and missing dialysis and sudden death. 10. Polysubstance abuse - ongoing issue; check drug screen; long history - cocaine and alcohol; I would not give her narcotics for pain; NSAID only. 11. Bilateral knee pain - chronic, worsened by fall; fall may have been precipitated by muscle weakness from hyperkalemia; xray neg fx; sm-mod effusion 12. DM - diet controlled  13. Right back lesion - refer to derm after d/c; outpt dialysis can arrange  14. Disp - ? D/c post HD today.  Pt raising concerns about going home without a walker or HHC.  Will await primary svc/case management    COLADONATO,JOSEPH A 12/13/2012, 1:35 PM

## 2012-12-13 NOTE — Progress Notes (Signed)
Patient was in dialysis when SW went to meet with patient. SW set up ambulance transport for the patient when patient returns for dialysis. SW also left a list of ALF facilities near [place] where the patient lives. Clinical Social Worker will sign off for now as social work intervention is no longer needed. Please consult [place] again if new need arises.   Sabino Niemann, MSW, [phone]

## 2012-12-13 NOTE — H&P (Signed)
HPI: [name] is an 62 y.o. female with ESRD receiving HD. They have been pulling some clots from her fistula and IR is requested to perform fistulogram and treat if necessary. Pt has not had prior intervention. PMHx and meds reviewed.  Past Medical History:  Past Medical History  Diagnosis Date  . Hypertension   . HIV infection   . Chronic renal failure   . CHF (congestive heart failure)   . ESRD (end stage renal disease)     Past Surgical History:  Past Surgical History  Procedure Laterality Date  . Arteriovenous graft placement      Family History: History reviewed. No pertinent family history.  Social History:  reports that she has been smoking.  She does not have any smokeless tobacco history on file. She reports that  drinks alcohol. She reports that she uses illicit drugs (Cocaine).  Allergies:  Allergies  Allergen Reactions  . Morphine And Related Hives and Rash    Medications: Medications Prior to Admission  Medication Sig Dispense Refill  . amitriptyline (ELAVIL) 10 MG tablet Take 20 mg by mouth at bedtime.      Marland Kitchen amLODipine (NORVASC) 10 MG tablet Take 10 mg by mouth daily.      . carvedilol (COREG) 6.25 MG tablet Take 6.25 mg by mouth 2 (two) times daily with a meal.      . cinacalcet (SENSIPAR) 60 MG tablet Take 60 mg by mouth daily.      . citalopram (CELEXA) 20 MG tablet Take 20 mg by mouth daily.      Marland Kitchen docusate sodium (COLACE) 100 MG capsule Take 100 mg by mouth 2 (two) times daily.      . hydrALAZINE (APRESOLINE) 100 MG tablet Take 100 mg by mouth 2 (two) times daily.      Marland Kitchen ketorolac (TORADOL) 10 MG tablet Take 10 mg by mouth every 6 (six) hours as needed for pain.      Marland Kitchen lamiVUDine (EPIVIR) 150 MG tablet Take 150 mg by mouth daily.      Marland Kitchen omeprazole (PRILOSEC) 20 MG capsule Take 20 mg by mouth daily.      . raltegravir (ISENTRESS) 400 MG tablet Take 400 mg by mouth 2 (two) times daily.      . sevelamer carbonate (RENVELA) 800 MG tablet Take 800  mg by mouth 3 (three) times daily with meals.      . sodium bicarbonate 650 MG tablet Take 1,300 mg by mouth 2 (two) times daily.      Marland Kitchen tenofovir (VIREAD) 300 MG tablet Take 300 mg by mouth once a week. Takes on Fridays.      . [DISCONTINUED] acetaminophen (TYLENOL) 500 MG tablet Take 1,000 mg by mouth every 6 (six) hours as needed for pain.      . [DISCONTINUED] oxyCODONE-acetaminophen (PERCOCET/ROXICET) 5-325 MG per tablet Take 1-2 tablets by mouth every 4 (four) hours as needed for pain.        Please HPI for pertinent positives, otherwise complete 10 system ROS negative.  Physical Exam: Blood pressure 134/60, pulse 91, temperature 98.5 F (36.9 C), temperature source Oral, resp. rate 17, height 5\' 4"  (1.626 m), weight 234 lb 4.8 oz (106.278 kg), SpO2 96.00%. Body mass index is 40.2 kg/(m^2).   General Appearance:  Alert, cooperative, no distress, appears stated age  Head:  Normocephalic, without obvious abnormality, atraumatic  ENT: Unremarkable  Lungs:   Clear to auscultation bilaterally, no w/r/r, respirations unlabored without use of accessory muscles.  Chest Wall:  No tenderness or deformity  Heart:  Regular rate and rhythm, S1, S2 normal, no murmur, rub or gallop. Carotids 2+ without bruit.  Extremities: (L)forearm AVF with good thrill, hand warm  Pulses: 2+ and symmetric  Neurologic: Normal affect, no gross deficits.   Results for orders placed during the hospital encounter of 12/11/12 (from the past 48 hour(s))  CBC WITH DIFFERENTIAL     Status: Abnormal   Collection Time    12/11/12 10:55 AM      Result Value Range   WBC 6.8  4.0 - 10.5 K/uL   RBC 3.20 (*) 3.87 - 5.11 MIL/uL   Hemoglobin 11.1 (*) 12.0 - 15.0 g/dL   HCT 45.4 (*) 09.8 - 11.9 %   MCV 101.9 (*) 78.0 - 100.0 fL   MCH 34.7 (*) 26.0 - 34.0 pg   MCHC 34.0  30.0 - 36.0 g/dL   RDW 14.7  82.9 - 56.2 %   Platelets 280  150 - 400 K/uL   Neutrophils Relative 42 (*) 43 - 77 %   Neutro Abs 2.8  1.7 - 7.7 K/uL    Lymphocytes Relative 48 (*) 12 - 46 %   Lymphs Abs 3.2  0.7 - 4.0 K/uL   Monocytes Relative 7  3 - 12 %   Monocytes Absolute 0.5  0.1 - 1.0 K/uL   Eosinophils Relative 2  0 - 5 %   Eosinophils Absolute 0.2  0.0 - 0.7 K/uL   Basophils Relative 1  0 - 1 %   Basophils Absolute 0.0  0.0 - 0.1 K/uL  BASIC METABOLIC PANEL     Status: Abnormal   Collection Time    12/11/12 10:55 AM      Result Value Range   Sodium 136  135 - 145 mEq/L   Potassium >7.5 (*) 3.5 - 5.1 mEq/L   Comment: CRITICAL RESULT CALLED TO, READ BACK BY AND VERIFIED WITH:     MICHAELSON B RN 12/11/12 1155 COSTELLO B     NO VISIBLE HEMOLYSIS   Chloride 89 (*) 96 - 112 mEq/L   CO2 17 (*) 19 - 32 mEq/L   Glucose, Bld 68 (*) 70 - 99 mg/dL   BUN 98 (*) 6 - 23 mg/dL   Creatinine, Ser 13.08 (*) 0.50 - 1.10 mg/dL   Calcium 9.6  8.4 - 65.7 mg/dL   GFR calc non Af Amer 3 (*) >90 mL/min   GFR calc Af Amer 3 (*) >90 mL/min   Comment:            The eGFR has been calculated     using the CKD EPI equation.     This calculation has not been     validated in all clinical     situations.     eGFR's persistently     <90 mL/min signify     possible Chronic Kidney Disease.  CK TOTAL AND CKMB     Status: None   Collection Time    12/11/12 10:55 AM      Result Value Range   Total CK 62  7 - 177 U/L   CK, MB 3.0  0.3 - 4.0 ng/mL   Relative Index RELATIVE INDEX IS INVALID  0.0 - 2.5   Comment: WHEN CK < 100 U/L             POCT I-STAT TROPONIN I     Status: None   Collection Time    12/11/12 11:05 AM  Result Value Range   Troponin i, poc 0.03  0.00 - 0.08 ng/mL   Comment 3            Comment: Due to the release kinetics of cTnI,     a negative result within the first hours     of the onset of symptoms does not rule out     myocardial infarction with certainty.     If myocardial infarction is still suspected,     repeat the test at appropriate intervals.  HEPATITIS B SURFACE ANTIGEN     Status: None   Collection Time     12/11/12  1:19 PM      Result Value Range   Hepatitis B Surface Ag NEGATIVE  NEGATIVE  GLUCOSE, CAPILLARY     Status: None   Collection Time    12/11/12  3:38 PM      Result Value Range   Glucose-Capillary 99  70 - 99 mg/dL  GLUCOSE, CAPILLARY     Status: Abnormal   Collection Time    12/11/12  5:48 PM      Result Value Range   Glucose-Capillary 174 (*) 70 - 99 mg/dL  MRSA PCR SCREENING     Status: None   Collection Time    12/11/12  6:26 PM      Result Value Range   MRSA by PCR NEGATIVE  NEGATIVE   Comment:            The GeneXpert MRSA Assay (FDA     approved for NASAL specimens     only), is one component of a     comprehensive MRSA colonization     surveillance program. It is not     intended to diagnose MRSA     infection nor to guide or     monitor treatment for     MRSA infections.  RENAL FUNCTION PANEL     Status: Abnormal   Collection Time    12/12/12  5:40 AM      Result Value Range   Sodium 137  135 - 145 mEq/L   Potassium 5.0  3.5 - 5.1 mEq/L   Chloride 93 (*) 96 - 112 mEq/L   CO2 26  19 - 32 mEq/L   Glucose, Bld 80  70 - 99 mg/dL   BUN 39 (*) 6 - 23 mg/dL   Comment: DIALYSIS   Creatinine, Ser 7.72 (*) 0.50 - 1.10 mg/dL   Comment: DIALYSIS   Calcium 10.3  8.4 - 10.5 mg/dL   Phosphorus 16.1 (*) 2.3 - 4.6 mg/dL   Albumin 3.6  3.5 - 5.2 g/dL   GFR calc non Af Amer 5 (*) >90 mL/min   GFR calc Af Amer 6 (*) >90 mL/min   Comment:            The eGFR has been calculated     using the CKD EPI equation.     This calculation has not been     validated in all clinical     situations.     eGFR's persistently     <90 mL/min signify     possible Chronic Kidney Disease.  CBC     Status: Abnormal   Collection Time    12/12/12  5:40 AM      Result Value Range   WBC 6.5  4.0 - 10.5 K/uL   RBC 3.20 (*) 3.87 - 5.11 MIL/uL   Hemoglobin 11.1 (*) 12.0 - 15.0 g/dL  HCT 32.8 (*) 36.0 - 46.0 %   MCV 102.5 (*) 78.0 - 100.0 fL   MCH 34.7 (*) 26.0 - 34.0 pg   MCHC  33.8  30.0 - 36.0 g/dL   RDW 16.1  09.6 - 04.5 %   Platelets 256  150 - 400 K/uL   Dg Knee 1-2 Views Right  12/12/2012  *RADIOLOGY REPORT*  Clinical Data: Chronic right knee pain.  RIGHT KNEE - 1-2 VIEW  Comparison: 10/12/2012.  Findings: No joint effusion or fracture.  Subchondral sclerosis and irregularity are seen in the lateral compartment.  Osteophytosis is greatest in the lateral compartment.  IMPRESSION: Degenerative changes affect primarily the lateral compartment.   Original Report Authenticated By: Leanna Battles, M.D.    Ct Head Wo Contrast  12/11/2012  *RADIOLOGY REPORT*  Clinical Data: Syncope, head injury  CT HEAD WITHOUT CONTRAST  Technique:  Contiguous axial images were obtained from the base of the skull through the vertex without contrast.  Comparison: None.  Findings: No acute intracranial hemorrhage.  No focal mass lesion. No CT evidence of acute infarction.   No midline shift or mass effect.  No hydrocephalus.  Basilar cisterns are patent.  There is periventricular white matter hypodensities.  There is subtle hypodensity within the left thalamus (image 14).   This appears present on comparison of 06/10/2011.  Paranasal sinuses and mastoid air cells are clear.  Orbits are normal.  Impression:  1.  No acute intracranial findings. 2.  Microvascular disease similar to prior.  3.  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   Original Report Authenticated By: Genevive Bi, M.D.    Dg Chest Port 1 View  12/11/2012  *RADIOLOGY REPORT*  Clinical Data: Cough, shortness of breath, vomiting.  PORTABLE CHEST - 1 VIEW  Comparison: 10/03/2012  Findings: Heart is borderline in size.  Mild vascular congestion. Interstitial prominence of the lungs has worsened since prior study.  Cannot exclude interstitial edema.  Mild prominence of the mediastinum, likely related to the portable semi-erect nature of the study.  No confluent airspace opacities or effusions. No acute bony abnormality.   IMPRESSION: Mild cardiomegaly, vascular congestion and worsening interstitial prominence, question interstitial edema.   Original Report Authenticated By: Charlett Nose, M.D.    Dg Knee Complete 4 Views Left  12/11/2012  *RADIOLOGY REPORT*  Clinical Data: Fall.  Anterior knee pain.  LEFT KNEE - COMPLETE 4+ VIEW  Comparison: Plain films 03/16/2011.  Findings: The patient has a small to moderate joint effusion.  No fracture is identified.  There is tricompartmental degenerative change appearing advanced in the medial and patellofemoral compartments.  IMPRESSION:  1.  Negative for fracture. 2.  Tricompartmental osteoarthritis and associated small to moderate joint effusion.   Original Report Authenticated By: Holley Dexter, M.D.     Assessment/Plan ESRD. Pulling clots from (L)UE AVF For fistulogram, poss PTA today. Explained procedure, risks, complications. Labs reviewed. Consent signed in chart  Brayton El PA-C 12/13/2012, 10:16 AM

## 2012-12-13 NOTE — Care Management Note (Addendum)
   CARE MANAGEMENT NOTE 12/13/2012  Patient:  [name], [name]   Account Number:  [id number]  Date Initiated:  12/13/2012  Documentation initiated by:  Johny Shock  Subjective/Objective Assessment:   Order for [facility] / HHPT     Action/Plan:   Met with pt who has HH aide thru her Medicaid, she is unhappy with service and will notify the company. CM will setup [facility] for home safety eval thru  [facility]. CSW informed of need for ambulance transport home.   Anticipated DC Date:  12/13/2012   Anticipated DC Plan:  HOME W HOME HEALTH SERVICES  In-house referral  Clinical Social Worker         Choice offered to / List presented to:          [facility] arranged  HH-1 RN  HH-2 PT      [facility] agency  [facility]   Status of service:  Completed, signed off Medicare Important Message given?   (If response is "NO", the following Medicare IM given date fields will be blank) Date Medicare IM given:   Date Additional Medicare IM given:    Discharge Disposition:  HOME W HOME HEALTH SERVICES  Per UR Regulation:    If discussed at Long Length of Stay Meetings, dates discussed:    Comments:  12/13/2012 Pt would like HHRN and HHPT. Johny Shock RN MPH Case Manager [phone]

## 2012-12-19 LAB — DRUG SCREEN PANEL (SERUM)

## 2013-01-30 DIAGNOSIS — R32 Unspecified urinary incontinence: Secondary | ICD-10-CM | POA: Insufficient documentation

## 2013-01-30 DIAGNOSIS — N2581 Secondary hyperparathyroidism of renal origin: Secondary | ICD-10-CM | POA: Insufficient documentation

## 2013-01-30 DIAGNOSIS — F17200 Nicotine dependence, unspecified, uncomplicated: Secondary | ICD-10-CM | POA: Insufficient documentation

## 2013-01-30 DIAGNOSIS — K8020 Calculus of gallbladder without cholecystitis without obstruction: Secondary | ICD-10-CM | POA: Insufficient documentation

## 2013-01-30 DIAGNOSIS — K2970 Gastritis, unspecified, without bleeding: Secondary | ICD-10-CM | POA: Insufficient documentation

## 2013-01-30 DIAGNOSIS — F411 Generalized anxiety disorder: Secondary | ICD-10-CM | POA: Insufficient documentation

## 2013-01-30 DIAGNOSIS — F1210 Cannabis abuse, uncomplicated: Secondary | ICD-10-CM | POA: Insufficient documentation

## 2013-01-31 DIAGNOSIS — M179 Osteoarthritis of knee, unspecified: Secondary | ICD-10-CM | POA: Insufficient documentation

## 2013-01-31 DIAGNOSIS — M1710 Unilateral primary osteoarthritis, unspecified knee: Secondary | ICD-10-CM | POA: Insufficient documentation

## 2013-02-04 DIAGNOSIS — N189 Chronic kidney disease, unspecified: Secondary | ICD-10-CM | POA: Insufficient documentation

## 2013-02-04 DIAGNOSIS — D631 Anemia in chronic kidney disease: Secondary | ICD-10-CM | POA: Insufficient documentation

## 2013-02-12 ENCOUNTER — Encounter (HOSPITAL_COMMUNITY): Payer: Self-pay | Admitting: Emergency Medicine

## 2013-02-12 ENCOUNTER — Emergency Department (HOSPITAL_COMMUNITY)
Admission: EM | Admit: 2013-02-12 | Discharge: 2013-02-12 | Disposition: A | Discharge status: Skilled Nursing Facility | Payer: Medicare Other | Attending: Emergency Medicine | Admitting: Emergency Medicine

## 2013-02-12 DIAGNOSIS — M25561 Pain in right knee: Secondary | ICD-10-CM

## 2013-02-12 DIAGNOSIS — Z79899 Other long term (current) drug therapy: Secondary | ICD-10-CM | POA: Insufficient documentation

## 2013-02-12 DIAGNOSIS — N186 End stage renal disease: Secondary | ICD-10-CM | POA: Insufficient documentation

## 2013-02-12 DIAGNOSIS — Z21 Asymptomatic human immunodeficiency virus [HIV] infection status: Secondary | ICD-10-CM | POA: Insufficient documentation

## 2013-02-12 DIAGNOSIS — G8929 Other chronic pain: Secondary | ICD-10-CM | POA: Insufficient documentation

## 2013-02-12 DIAGNOSIS — M25569 Pain in unspecified knee: Secondary | ICD-10-CM | POA: Insufficient documentation

## 2013-02-12 DIAGNOSIS — F17200 Nicotine dependence, unspecified, uncomplicated: Secondary | ICD-10-CM | POA: Insufficient documentation

## 2013-02-12 DIAGNOSIS — Z7982 Long term (current) use of aspirin: Secondary | ICD-10-CM | POA: Insufficient documentation

## 2013-02-12 DIAGNOSIS — I509 Heart failure, unspecified: Secondary | ICD-10-CM | POA: Insufficient documentation

## 2013-02-12 DIAGNOSIS — I120 Hypertensive chronic kidney disease with stage 5 chronic kidney disease or end stage renal disease: Secondary | ICD-10-CM | POA: Insufficient documentation

## 2013-02-12 LAB — CBC WITH DIFFERENTIAL/PLATELET
Basophils Absolute: 0 10*3/uL (ref 0.0–0.1)
HCT: 33.6 % — ABNORMAL LOW (ref 36.0–46.0)
Hemoglobin: 11.6 g/dL — ABNORMAL LOW (ref 12.0–15.0)
Lymphocytes Relative: 26 % (ref 12–46)
Lymphs Abs: 2.5 10*3/uL (ref 0.7–4.0)
Monocytes Absolute: 0.9 10*3/uL (ref 0.1–1.0)
Neutro Abs: 6 10*3/uL (ref 1.7–7.7)
RBC: 3.18 MIL/uL — ABNORMAL LOW (ref 3.87–5.11)
RDW: 15.3 % (ref 11.5–15.5)
WBC: 9.6 10*3/uL (ref 4.0–10.5)

## 2013-02-12 LAB — POCT I-STAT, CHEM 8
BUN: 35 mg/dL — ABNORMAL HIGH (ref 6–23)
Calcium, Ion: 0.99 mmol/L — ABNORMAL LOW (ref 1.13–1.30)
Chloride: 104 mEq/L (ref 96–112)

## 2013-02-12 MED ORDER — LAMIVUDINE 150 MG PO TABS
150.0000 mg | ORAL_TABLET | Freq: Every day | ORAL | Status: DC
  Administered 2013-02-12: 150 mg via ORAL
  Filled 2013-02-12: qty 1

## 2013-02-12 MED ORDER — RALTEGRAVIR POTASSIUM 400 MG PO TABS
400.0000 mg | ORAL_TABLET | Freq: Two times a day (BID) | ORAL | Status: DC
Start: 2013-02-12 — End: 2013-02-12
  Administered 2013-02-12: 400 mg via ORAL
  Filled 2013-02-12 (×2): qty 1

## 2013-02-12 MED ORDER — ASPIRIN EC 81 MG PO TBEC
81.0000 mg | DELAYED_RELEASE_TABLET | Freq: Every day | ORAL | Status: DC
Start: 2013-02-12 — End: 2013-02-12
  Administered 2013-02-12: 81 mg via ORAL
  Filled 2013-02-12: qty 1

## 2013-02-12 MED ORDER — AMLODIPINE BESYLATE 10 MG PO TABS
10.0000 mg | ORAL_TABLET | Freq: Every day | ORAL | Status: DC
  Administered 2013-02-12: 10 mg via ORAL
  Filled 2013-02-12: qty 1

## 2013-02-12 MED ORDER — KETOROLAC TROMETHAMINE 10 MG PO TABS
10.0000 mg | ORAL_TABLET | Freq: Four times a day (QID) | ORAL | Status: DC | PRN
  Filled 2013-02-12: qty 1

## 2013-02-12 MED ORDER — AMITRIPTYLINE HCL 10 MG PO TABS
20.0000 mg | ORAL_TABLET | Freq: Every day | ORAL | Status: DC
  Filled 2013-02-12: qty 2

## 2013-02-12 MED ORDER — CITALOPRAM HYDROBROMIDE 10 MG PO TABS
20.0000 mg | ORAL_TABLET | Freq: Every day | ORAL | Status: DC
Start: 2013-02-12 — End: 2013-02-12
  Administered 2013-02-12: 20 mg via ORAL
  Filled 2013-02-12: qty 2

## 2013-02-12 MED ORDER — PANTOPRAZOLE SODIUM 40 MG PO TBEC
40.0000 mg | DELAYED_RELEASE_TABLET | Freq: Every day | ORAL | Status: DC
  Administered 2013-02-12: 40 mg via ORAL
  Filled 2013-02-12: qty 1

## 2013-02-12 MED ORDER — HYDRALAZINE HCL 50 MG PO TABS
100.0000 mg | ORAL_TABLET | Freq: Two times a day (BID) | ORAL | Status: DC
  Administered 2013-02-12: 100 mg via ORAL
  Filled 2013-02-12 (×2): qty 2

## 2013-02-12 MED ORDER — TENOFOVIR DISOPROXIL FUMARATE 300 MG PO TABS: 300.0000 mg | ORAL_TABLET | ORAL | Status: DC

## 2013-02-12 MED ORDER — SEVELAMER CARBONATE 800 MG PO TABS
800.0000 mg | ORAL_TABLET | Freq: Three times a day (TID) | ORAL | Status: DC
Start: 2013-02-12 — End: 2013-02-12
  Administered 2013-02-12 (×2): 800 mg via ORAL
  Filled 2013-02-12 (×5): qty 1

## 2013-02-12 MED ORDER — DOCUSATE SODIUM 100 MG PO CAPS
100.0000 mg | ORAL_CAPSULE | Freq: Two times a day (BID) | ORAL | Status: DC
Start: 2013-02-12 — End: 2013-02-12
  Administered 2013-02-12: 100 mg via ORAL
  Filled 2013-02-12 (×2): qty 1

## 2013-02-12 MED ORDER — SODIUM BICARBONATE 650 MG PO TABS
1300.0000 mg | ORAL_TABLET | Freq: Two times a day (BID) | ORAL | Status: DC
Start: 2013-02-12 — End: 2013-02-12
  Administered 2013-02-12: 1300 mg via ORAL
  Filled 2013-02-12 (×2): qty 2

## 2013-02-12 MED ORDER — CARVEDILOL 6.25 MG PO TABS
6.2500 mg | ORAL_TABLET | Freq: Two times a day (BID) | ORAL | Status: DC
  Administered 2013-02-12: 6.25 mg via ORAL
  Filled 2013-02-12 (×3): qty 1

## 2013-02-12 MED ORDER — OXYCODONE-ACETAMINOPHEN 5-325 MG PO TABS
1.0000 | ORAL_TABLET | ORAL | Status: DC | PRN
  Administered 2013-02-12 (×2): 2 via ORAL
  Filled 2013-02-12 (×2): qty 2

## 2013-02-12 MED ORDER — CINACALCET HCL 30 MG PO TABS
60.0000 mg | ORAL_TABLET | Freq: Every day | ORAL | Status: DC
  Administered 2013-02-12: 60 mg via ORAL
  Filled 2013-02-12 (×2): qty 2

## 2013-02-12 NOTE — ED Notes (Signed)
Still awaiting arrival of 0315 meds from pharmacy.

## 2013-02-12 NOTE — Progress Notes (Signed)
Clinical Social Work Department BRIEF PSYCHOSOCIAL ASSESSMENT 02/12/2013  Patient:  [name], [name]     Account Number:  [id number]     Admit date:  02/12/2013  Clinical Social Worker:  Leron Croak, CLINICAL SOCIAL WORKER  Date/Time:  02/12/2013 02:59 PM  Referred by:  Physician  Date Referred:  02/12/2013 Referred for  SNF Placement   Other Referral:   Interview type:  Patient Other interview type:    PSYCHOSOCIAL DATA Living Status:  ALONE Admitted from facility:   Level of care:   Primary support name:  Lamona Eimer [phone] Primary support relationship to patient:  CHILD, ADULT Degree of support available:   Pt has limited to no support systems    CURRENT CONCERNS Current Concerns  Post-Acute Placement   Other Concerns:    SOCIAL WORK ASSESSMENT / PLAN CSW met with the Pt at the bedside. Pt is agreeable to returning to [facility]. Pt had left yesterday in order to "use drugs" and Pt stated she is scared. CSW reinforced that the best place for her to get medically well is in a SNF where she could get the proper medical treatment. Pt does realize that she needs medical assistance and that she can not live on her own. Pt notified that orders will be placed for her to return via [facility].  Facility notified and are willing to accept Pt back.   Assessment/plan status:  Information/Referral to [facility] Other assessment/ plan:   Information/referral to community resources:   Pt aware of facility and does not need additional resources at this time.    PATIENT'S/FAMILY'S RESPONSE TO PLAN OF CARE: Pt appreciative for assistance and willing to return to [facility].       Leron Croak, LCSWA [facility]  [phone]

## 2013-02-12 NOTE — ED Provider Notes (Signed)
She has been accepted at [facility] and will be discharged.   Dione Booze, MD 02/12/13 [phone]
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
## 2013-02-12 NOTE — ED Notes (Signed)
Pt does admit to smoking Marijuana tonight.

## 2013-02-12 NOTE — ED Notes (Addendum)
Pt states that she was being seen in [place] for knee replacement however they could not do the procedure due to her medical issues. Pt left nursing facility yesterday she was staying at yesterday and went to daughters, then wanted to come to [facility] to be evaluated about her knee. Pt is dialysis pt tues thurs saturday

## 2013-02-12 NOTE — ED Notes (Signed)
Pt reports R chronic Knee pain for several months. Pt seen at [place] last week and [facility] this week. Pt currently Ax4, NAD. Hx of HTN, HIV, and CHF

## 2013-02-12 NOTE — Progress Notes (Addendum)
Clinical Social Work Department CLINICAL SOCIAL WORK PLACEMENT NOTE 02/12/2013  Patient:  [name], [name]  Account Number:  [id number] Admit date:  02/12/2013  Clinical Social Worker:  Leron Croak, CLINICAL SOCIAL WORKER  Date/time:  02/12/2013 03:08 PM  Clinical Social Work is seeking post-discharge placement for this patient at the following level of care:   SKILLED NURSING   (*CSW will update this form in Epic as items are completed)   02/12/2013  Patient/family provided with [facility] Department of Clinical Social Work's list of facilities offering this level of care within the geographic area requested by the patient (or if unable, by the patient's family).  02/12/2013  Patient/family informed of their freedom to choose among providers that offer the needed level of care, that participate in Medicare, Medicaid or managed care program needed by the patient, have an available bed and are willing to accept the patient.  02/12/2013  Patient/family informed of [facility]' ownership interest in [facility], as well as of the fact that they are under no obligation to receive care at this facility.  PASARR submitted to EDS on 02/07/2013 PASARR number received from EDS on 02/07/2013  FL2 transmitted to all facilities in geographic area requested by pt/family on  02/07/2013 FL2 transmitted to all facilities within larger geographic area on 02/07/2013  Patient informed that his/her managed care company has contracts with or will negotiate with  certain facilities, including the following:     Patient/family informed of bed offers received:   Patient chooses bed at        [facility] Physician recommends and patient chooses bed at    Patient to be transferred to   [facility]  on  02/12/2013 Patient to be transferred to facility by [facility]  The following physician request were entered in Epic:   Additional Comments:   Leron Croak, [facility]  [phone]

## 2013-02-12 NOTE — ED Notes (Signed)
Pt transported via [facility] to [facility]

## 2013-02-18 NOTE — ED Provider Notes (Signed)
Medical screening examination/treatment/procedure(s) were performed by non-physician practitioner and as supervising physician I was immediately available for consultation/collaboration.  Jones Skene, M.D.     Jones Skene, MD 02/18/13 2339

## 2013-02-21 DIAGNOSIS — K5900 Constipation, unspecified: Secondary | ICD-10-CM | POA: Insufficient documentation

## 2013-02-21 DIAGNOSIS — W19XXXA Unspecified fall, initial encounter: Secondary | ICD-10-CM | POA: Insufficient documentation

## 2013-05-25 DIAGNOSIS — I471 Supraventricular tachycardia, unspecified: Secondary | ICD-10-CM | POA: Insufficient documentation

## 2013-07-31 ENCOUNTER — Emergency Department (HOSPITAL_COMMUNITY): Payer: Medicare Other

## 2013-07-31 ENCOUNTER — Encounter (HOSPITAL_COMMUNITY): Payer: Self-pay | Admitting: Emergency Medicine

## 2013-07-31 ENCOUNTER — Emergency Department (HOSPITAL_COMMUNITY)
Admission: EM | Admit: 2013-07-31 | Discharge: 2013-07-31 | Disposition: A | Discharge status: Home / Self Care | Payer: Medicare Other | Attending: Emergency Medicine | Admitting: Emergency Medicine

## 2013-07-31 DIAGNOSIS — F17200 Nicotine dependence, unspecified, uncomplicated: Secondary | ICD-10-CM | POA: Insufficient documentation

## 2013-07-31 DIAGNOSIS — Z21 Asymptomatic human immunodeficiency virus [HIV] infection status: Secondary | ICD-10-CM | POA: Insufficient documentation

## 2013-07-31 DIAGNOSIS — Z992 Dependence on renal dialysis: Secondary | ICD-10-CM | POA: Insufficient documentation

## 2013-07-31 DIAGNOSIS — F1910 Other psychoactive substance abuse, uncomplicated: Secondary | ICD-10-CM

## 2013-07-31 DIAGNOSIS — R0602 Shortness of breath: Secondary | ICD-10-CM | POA: Insufficient documentation

## 2013-07-31 DIAGNOSIS — I509 Heart failure, unspecified: Secondary | ICD-10-CM | POA: Insufficient documentation

## 2013-07-31 DIAGNOSIS — G8929 Other chronic pain: Secondary | ICD-10-CM | POA: Insufficient documentation

## 2013-07-31 DIAGNOSIS — I120 Hypertensive chronic kidney disease with stage 5 chronic kidney disease or end stage renal disease: Secondary | ICD-10-CM | POA: Insufficient documentation

## 2013-07-31 DIAGNOSIS — R079 Chest pain, unspecified: Secondary | ICD-10-CM

## 2013-07-31 DIAGNOSIS — M25569 Pain in unspecified knee: Secondary | ICD-10-CM | POA: Insufficient documentation

## 2013-07-31 DIAGNOSIS — Z79899 Other long term (current) drug therapy: Secondary | ICD-10-CM | POA: Insufficient documentation

## 2013-07-31 DIAGNOSIS — N186 End stage renal disease: Secondary | ICD-10-CM | POA: Insufficient documentation

## 2013-07-31 DIAGNOSIS — M25559 Pain in unspecified hip: Secondary | ICD-10-CM | POA: Insufficient documentation

## 2013-07-31 LAB — BASIC METABOLIC PANEL
CO2: 25 mEq/L (ref 19–32)
Calcium: 9.6 mg/dL (ref 8.4–10.5)
Chloride: 97 mEq/L (ref 96–112)
Glucose, Bld: 123 mg/dL — ABNORMAL HIGH (ref 70–99)
Potassium: 5 mEq/L (ref 3.5–5.1)
Sodium: 135 mEq/L (ref 135–145)

## 2013-07-31 LAB — POCT I-STAT TROPONIN I
Troponin i, poc: 0 ng/mL (ref 0.00–0.08)
Troponin i, poc: 0.01 ng/mL (ref 0.00–0.08)

## 2013-07-31 LAB — PRO B NATRIURETIC PEPTIDE: Pro B Natriuretic peptide (BNP): 4928 pg/mL — ABNORMAL HIGH (ref 0–125)

## 2013-07-31 LAB — CBC WITH DIFFERENTIAL/PLATELET
Basophils Absolute: 0 10*3/uL (ref 0.0–0.1)
Eosinophils Absolute: 0.1 10*3/uL (ref 0.0–0.7)
Eosinophils Relative: 1 % (ref 0–5)
HCT: 34.1 % — ABNORMAL LOW (ref 36.0–46.0)
Lymphocytes Relative: 31 % (ref 12–46)
Lymphs Abs: 2.8 10*3/uL (ref 0.7–4.0)
MCHC: 32.6 g/dL (ref 30.0–36.0)
MCV: 108.9 fL — ABNORMAL HIGH (ref 78.0–100.0)
Neutro Abs: 4.9 10*3/uL (ref 1.7–7.7)
Neutrophils Relative %: 56 % (ref 43–77)
Platelets: 281 10*3/uL (ref 150–400)
RBC: 3.13 MIL/uL — ABNORMAL LOW (ref 3.87–5.11)
WBC: 8.8 10*3/uL (ref 4.0–10.5)

## 2013-07-31 MED ORDER — ACETAMINOPHEN 500 MG PO TABS
1000.0000 mg | ORAL_TABLET | Freq: Once | ORAL | Status: DC
  Filled 2013-07-31: qty 2

## 2013-07-31 MED ORDER — ASPIRIN 81 MG PO CHEW
324.0000 mg | CHEWABLE_TABLET | Freq: Once | ORAL | Status: AC
  Administered 2013-07-31: 324 mg via ORAL
  Filled 2013-07-31: qty 4

## 2013-07-31 NOTE — ED Notes (Signed)
Pt refused Tylenol for pain.  St's you can keep it.  Pt requesting a cab to go home, st's she does not have money to pay for it.  Requesting Child psychotherapist.  Social worker called to  Come talk to pt.  Message left.  Pt discharged and rolled to lobby via wheelchair.

## 2013-07-31 NOTE — ED Notes (Signed)
1st contact with pt. Pt resting with nothing needed at this time. nadn.

## 2013-07-31 NOTE — ED Notes (Signed)
Into check on pt. sts would like more crackers and juice.

## 2013-07-31 NOTE — ED Notes (Signed)
Pt states that has also been using "crack". Last use was Friday night. Pt wants to go to rehab through [facility].

## 2013-07-31 NOTE — ED Notes (Signed)
Per EMS- pt conplains of rt leg pain that radiates to hip. Pt has ongoing pain in knee related to cartilage. Pt states that she also has some SOB but has not had dialysis since Monday. Pt states that she has trouble keeping a dialysis center.

## 2013-07-31 NOTE — ED Notes (Signed)
Pt requesting something to eat. Happy meal given

## 2013-07-31 NOTE — ED Notes (Signed)
I spoke with Social Worker Verdon Cummins)  St's she will relay message to [place].

## 2013-07-31 NOTE — ED Notes (Signed)
Pt returned from radiology. Pt placed back on monitors. Nadn. Pt resting

## 2013-07-31 NOTE — ED Provider Notes (Addendum)
TIME SEEN: 10:10 AM  CHIEF COMPLAINT: Right knee pain, right hip pain, chest tightness and shortness of breath  HPI: Patient is a [age]-year-old female with a history of hypertension, congestive heart failure, HIV (stage she is on antiretrovirals treatment and denies any history of opportunistic infections, is not sure what her CD4 or viral load are), end-stage renal disease on hemodialysis Tuesday, Thursday and Saturday who presents emergency department with multiple complaints. States that she is had right knee pain and right hip pain that did present for the past year. No new injury. No posterior calf pain or lower extremity swelling. No prior history of DVT or PE. She states she is here because "I want to get in to [facility]".  Pt states she has a wheelchair that she uses at home.  Patient reports that she has had diffuse chest tightness has been constant since 5:30 am associated with shortness of breath. She denies a prior history of CAD, MI. She denies any history of cardiac stents. She has had similar symptoms multiple times in the past with negative workups. She is normally seen at [facility]. Patient reports that she was admitted to [facility] 2 days ago for chest pain and had a negative workup and was discharged home yesterday morning. She reports she had a negative stress test.   She states that she missed her dialysis on Saturday but was dialyzed on Monday. She's not had dialysis today. Denies any fever, cough, vomiting or diarrhea. Patient does have a primary care physician in [place]. ROS: See HPI Constitutional: no fever  Eyes: no drainage  ENT: no runny nose   Cardiovascular:  chest pain  Resp:  SOB  GI: no vomiting GU: no dysuria Integumentary: no rash  Allergy: no hives  Musculoskeletal: no leg swelling  Neurological: no slurred speech ROS otherwise negative  PAST MEDICAL HISTORY/PAST SURGICAL HISTORY:  Past Medical History  Diagnosis Date  .  Hypertension   . HIV infection   . Chronic renal failure   . CHF (congestive heart failure)   . ESRD (end stage renal disease)     MEDICATIONS:  Prior to Admission medications   Medication Sig Start Date End Date Taking? Authorizing Provider  amitriptyline (ELAVIL) 10 MG tablet Take 20 mg by mouth at bedtime.    Historical Provider, MD  amLODipine (NORVASC) 10 MG tablet Take 10 mg by mouth daily.    Historical Provider, MD  carvedilol (COREG) 6.25 MG tablet Take 6.25 mg by mouth 2 (two) times daily with a meal.    Historical Provider, MD  cinacalcet (SENSIPAR) 60 MG tablet Take 60 mg by mouth daily.    Historical Provider, MD  citalopram (CELEXA) 20 MG tablet Take 20 mg by mouth daily.    Historical Provider, MD  docusate sodium (COLACE) 100 MG capsule Take 100 mg by mouth 2 (two) times daily.    Historical Provider, MD  hydrALAZINE (APRESOLINE) 100 MG tablet Take 100 mg by mouth 3 (three) times daily.    Historical Provider, MD  lamiVUDine (EPIVIR) 150 MG tablet Take 150 mg by mouth daily.    Historical Provider, MD  lisinopril (PRINIVIL,ZESTRIL) 10 MG tablet Take 10 mg by mouth daily.    Historical Provider, MD  omeprazole (PRILOSEC) 20 MG capsule Take 20 mg by mouth daily.    Historical Provider, MD  raltegravir (ISENTRESS) 400 MG tablet Take 400 mg by mouth 2 (two) times daily.    Historical Provider, MD  sevelamer carbonate (  RENVELA) 800 MG tablet Take 1,600-2,400 mg by mouth 5 (five) times daily. 3 Tabs with meals and 2 tabs with snacks    Historical Provider, MD  sodium bicarbonate 650 MG tablet Take 1,300 mg by mouth 2 (two) times daily.    Historical Provider, MD  tenofovir (VIREAD) 300 MG tablet Take 300 mg by mouth once a week. Takes on Fridays.    Historical Provider, MD    ALLERGIES:  Allergies  Allergen Reactions  . Morphine And Related Hives and Rash    SOCIAL HISTORY:  History  Substance Use Topics  . Smoking status: Current Every Day Smoker -- 0.50 packs/day  .  Smokeless tobacco: Not on file  . Alcohol Use: Yes    FAMILY HISTORY: No family history on file.  EXAM: BP 161/89  Pulse 100  Temp(Src) 97.8 F (36.6 C) (Oral)  Resp 19  SpO2 100% CONSTITUTIONAL: Alert and oriented and responds appropriately to questions. Well-appearing; well-nourished HEAD: Normocephalic EYES: Conjunctivae clear, PERRL ENT: normal nose; no rhinorrhea; moist mucous membranes; pharynx without lesions noted NECK: Supple, no meningismus, no LAD  CARD: RRR; S1 and S2 appreciated; no murmurs, no clicks, no rubs, no gallops RESP: Normal chest excursion without splinting or tachypnea; breath sounds clear and equal bilaterally; no wheezes, no rhonchi, no rales,  ABD/GI: Normal bowel sounds; non-distended; soft, non-tender, no rebound, no guarding BACK:  The back appears normal and is non-tender to palpation, there is no CVA tenderness EXT: Tender to palpation over her right hip and right knee diffusely, tender to palpation over the medial joint line of the right knee, no joint effusion, no erythema or warmth, 2+ DP pulses bilaterally, passive Normal ROM in all joints; non-tender to palpation; no edema; normal capillary refill; no cyanosis    SKIN: Normal color for age and race; warm NEURO: Moves all extremities equally strength 5/5 in all 4 extremities, sensation to light touch intact diffusely, cranial nerves II through XII intact PSYCH: The patient's mood and manner are appropriate. Grooming and personal hygiene are appropriate.  MEDICAL DECISION MAKING: Patient here with chest pain and shortness of breath as well as chronic right lower extremity pain. Suspect her right lower extremity pain is secondary to arthritic changes, meniscal injury. No concern for DVT or cellulitis. No signs of septic arthritis. Will obtain basic films. We'll also obtain cardiac labs. EKG shows no ischemic changes. Patient has multiple risk factors for ACS but her chest pain and shortness of breath  seemed to be her secondary concern and she has been seen multiple times at [facility] for the same with negative workup. I am not concerned for PE at this time. No signs of volume overload on exam. Given she recently had a workup at [facility], I do not feel she necessarily needs admission for ACS rule out at this time.   Date: 07/31/2013 10:01 AM  Rate: 97  Rhythm: normal sinus rhythm  QRS Axis: normal  Intervals: normal  ST/T Wave abnormalities: normal  Conduction Disutrbances: none  Narrative Interpretation: unremarkable; no ischemic changes, unchanged compared to prior EKG, no ectopy      ED PROGRESS: Labs unremarkable. Troponin negative. Chest x-ray, hip and right knee x-ray show no acute changes. Patient has arthritic changes. Patient reports her chest pain is gone after aspirin. She still hemodynamically stable. Will repeat troponin that is greater than 6 hours after onset of symptoms. I do not feel she needs admission at this time for chest pain rule out given she was  admitted to the hospital 2 days ago for the same with reported negative workup. If repeat troponin negative, anticipate discharge home with outpatient followup.  Patient is still hemodynamically stable, no chest pain or shortness of breath. Repeat troponin pending. Patient now reports that the real reason she is here in the ED is because she wants inpatient admission for substance abuse. Have explained to patient at length that she does not meet any criteria at this time for inpatient detox for crack/cocaine. Will get outpatient resources for substance abuse. No current SI or HI.  No delusions, hallucinations.  No safety concerns.  Repeat troponin is negative. Will discharge home. Given return precautions.     Layla Maw Ward, DO 07/31/13 1555  Layla Maw Ward, DO 07/31/13 [phone]

## 2013-07-31 NOTE — ED Notes (Signed)
Pt taken to radiology

## 2013-08-12 DIAGNOSIS — F1190 Opioid use, unspecified, uncomplicated: Secondary | ICD-10-CM | POA: Insufficient documentation

## 2013-09-29 ENCOUNTER — Emergency Department (HOSPITAL_COMMUNITY): Payer: Medicare Other

## 2013-09-29 ENCOUNTER — Inpatient Hospital Stay (HOSPITAL_COMMUNITY)
Admission: EM | Admit: 2013-09-29 | Discharge: 2013-10-01 | DRG: 682 | Discharge status: Against Medical Advice | Payer: Medicare Other | Attending: Internal Medicine | Admitting: Internal Medicine

## 2013-09-29 ENCOUNTER — Encounter (HOSPITAL_COMMUNITY): Payer: Self-pay | Admitting: Emergency Medicine

## 2013-09-29 DIAGNOSIS — N2581 Secondary hyperparathyroidism of renal origin: Secondary | ICD-10-CM | POA: Diagnosis present

## 2013-09-29 DIAGNOSIS — Z21 Asymptomatic human immunodeficiency virus [HIV] infection status: Secondary | ICD-10-CM | POA: Diagnosis present

## 2013-09-29 DIAGNOSIS — I10 Essential (primary) hypertension: Secondary | ICD-10-CM

## 2013-09-29 DIAGNOSIS — J811 Chronic pulmonary edema: Secondary | ICD-10-CM | POA: Diagnosis present

## 2013-09-29 DIAGNOSIS — J810 Acute pulmonary edema: Secondary | ICD-10-CM | POA: Diagnosis present

## 2013-09-29 DIAGNOSIS — N186 End stage renal disease: Secondary | ICD-10-CM | POA: Diagnosis present

## 2013-09-29 DIAGNOSIS — Z9115 Patient's noncompliance with renal dialysis: Secondary | ICD-10-CM

## 2013-09-29 DIAGNOSIS — E875 Hyperkalemia: Secondary | ICD-10-CM | POA: Diagnosis present

## 2013-09-29 DIAGNOSIS — I120 Hypertensive chronic kidney disease with stage 5 chronic kidney disease or end stage renal disease: Principal | ICD-10-CM | POA: Diagnosis present

## 2013-09-29 DIAGNOSIS — B20 Human immunodeficiency virus [HIV] disease: Secondary | ICD-10-CM | POA: Diagnosis present

## 2013-09-29 DIAGNOSIS — Z91158 Patient's noncompliance with renal dialysis for other reason: Secondary | ICD-10-CM

## 2013-09-29 DIAGNOSIS — F1910 Other psychoactive substance abuse, uncomplicated: Secondary | ICD-10-CM | POA: Diagnosis present

## 2013-09-29 DIAGNOSIS — R0600 Dyspnea, unspecified: Secondary | ICD-10-CM

## 2013-09-29 DIAGNOSIS — D649 Anemia, unspecified: Secondary | ICD-10-CM | POA: Diagnosis present

## 2013-09-29 DIAGNOSIS — I509 Heart failure, unspecified: Secondary | ICD-10-CM | POA: Diagnosis present

## 2013-09-29 NOTE — ED Provider Notes (Signed)
CSN: [id number]     Arrival date & time 09/29/13  2253 History   First MD Initiated Contact with Patient 09/29/13 2303     Chief Complaint  Patient presents with  . Shortness of Breath   (Consider location/radiation/quality/duration/timing/severity/associated sxs/prior Treatment) HPI Comments: 62 yo female with HIV (takes retrovirals regularly, follows [facility], no recent checkup, unknown levels but no known opportunistic infections), ESRD on dialysis MWF missed today bc no ride to [employer].  Mild productive cough, mild sob similar to previous dialysis/ CHF hx.  No cp or syncope.  Mild LE edema bilateral.  No blood clot hx or recent surgery or active CA.  Unsure if worse with lying flat.   Patient is a 62 y.o. female presenting with shortness of breath. The history is provided by the patient.  Shortness of Breath Associated symptoms: cough   Associated symptoms: no abdominal pain, no chest pain, no fever, no headaches, no neck pain, no rash and no vomiting     Past Medical History  Diagnosis Date  . Hypertension   . HIV infection   . Chronic renal failure   . CHF (congestive heart failure)   . ESRD (end stage renal disease)    Past Surgical History  Procedure Laterality Date  . Arteriovenous graft placement     No family history on file. History  Substance Use Topics  . Smoking status: Current Every Day Smoker -- 0.50 packs/day  . Smokeless tobacco: Not on file  . Alcohol Use: Yes   OB History   Grav Para Term Preterm Abortions TAB SAB Ect Mult Living                 Review of Systems  Constitutional: Negative for fever and chills.  HENT: Negative for congestion.   Eyes: Negative for visual disturbance.  Respiratory: Positive for cough and shortness of breath.   Cardiovascular: Positive for leg swelling. Negative for chest pain.  Gastrointestinal: Negative for vomiting and abdominal pain.  Genitourinary: Negative for dysuria and flank pain.  Musculoskeletal:  Negative for back pain, neck pain and neck stiffness.  Skin: Negative for rash.  Neurological: Negative for light-headedness and headaches.    Allergies  Morphine and related  Home Medications   Current Outpatient Rx  Name  Route  Sig  Dispense  Refill  . amitriptyline (ELAVIL) 10 MG tablet   Oral   Take 20 mg by mouth at bedtime.         Marland Kitchen amLODipine (NORVASC) 10 MG tablet   Oral   Take 10 mg by mouth daily.         . carvedilol (COREG) 6.25 MG tablet   Oral   Take 6.25 mg by mouth 2 (two) times daily with a meal.         . cinacalcet (SENSIPAR) 60 MG tablet   Oral   Take 60 mg by mouth daily.         Marland Kitchen lamiVUDine (EPIVIR) 150 MG tablet   Oral   Take 150 mg by mouth daily before breakfast.          . omeprazole (PRILOSEC) 20 MG capsule   Oral   Take 20 mg by mouth daily.         . raltegravir (ISENTRESS) 400 MG tablet   Oral   Take 400 mg by mouth 2 (two) times daily.          . sevelamer carbonate (RENVELA) 800 MG tablet   Oral  Take 1,600-2,400 mg by mouth 5 (five) times daily. 3 Tabs with meals and 2 tabs with snacks         . tenofovir (VIREAD) 300 MG tablet   Oral   Take 300 mg by mouth every Friday. Takes on Fridays.          There were no vitals taken for this visit. Physical Exam  Nursing note and vitals reviewed. Constitutional: She is oriented to person, place, and time. She appears well-developed and well-nourished.  HENT:  Head: Normocephalic and atraumatic.  Eyes: Conjunctivae are normal. Right eye exhibits no discharge. Left eye exhibits no discharge.  Neck: Normal range of motion. Neck supple. No tracheal deviation present.  Cardiovascular: Normal rate and regular rhythm.   Pulmonary/Chest: Effort normal. She has rales (few crackles at bases).  Abdominal: Soft. She exhibits no distension. There is no tenderness. There is no guarding.  Musculoskeletal: She exhibits edema (mild bilateral LE).  Neurological: She is alert and  oriented to person, place, and time.  Skin: Skin is warm. No rash noted.  Psychiatric: She has a normal mood and affect.    ED Course  Procedures (including critical care time) Labs Review Labs Reviewed  PRO B NATRIURETIC PEPTIDE - Abnormal; Notable for the following:    Pro B Natriuretic peptide (BNP) 31055.0 (*)    All other components within normal limits  CBC WITH DIFFERENTIAL - Abnormal; Notable for the following:    RBC 2.66 (*)    Hemoglobin 9.3 (*)    HCT 27.4 (*)    MCV 103.0 (*)    MCH 35.0 (*)    All other components within normal limits  BASIC METABOLIC PANEL - Abnormal; Notable for the following:    Potassium 6.2 (*)    Chloride 94 (*)    CO2 17 (*)    Glucose, Bld 109 (*)    BUN 95 (*)    Creatinine, Ser 16.19 (*)    GFR calc non Af Amer 2 (*)    GFR calc Af Amer 2 (*)    All other components within normal limits  TROPONIN I  POTASSIUM   Imaging Review Dg Chest 2 View  09/30/2013   CLINICAL DATA:  Short of breath.  Congestion.  Dizziness.  EXAM: CHEST  2 VIEW  COMPARISON:  07/31/2013.  FINDINGS: Cardiopericardial silhouette is mildly enlarged. Lung volumes are low. Tortuous thoracic aorta with atherosclerosis. Scattered areas of subsegmental atelectasis. There is new pulmonary vascular congestion. Interstitial opacity is present compatible with edema.  IMPRESSION: Constellation of findings compatible with mild CHF.   Electronically Signed   By: Andreas Newport M.D.   On: 09/30/2013 00:00    EKG Interpretation    Date/Time:  Monday September 29 2013 22:59:05 EST Ventricular Rate:  105 PR Interval:  200 QRS Duration: 104 QT Interval:  352 QTC Calculation: 465 R Axis:   68 Text Interpretation:  Sinus tachycardia Minimal ST elevation, inferior leads Similar to previous, no acute findings Confirmed by Caralynn Gelber  MD, Kissa Campoy ([id number]) on 09/29/2013 11:39:13 PM            MDM   1. Pulmonary edema   2. ESRD (end stage renal disease) on dialysis   3.  Hyperkalemia   4. Acute dyspnea   5. HIV disease   HTN Clinically mild CHF/ missed dialysis vs URI/ Pneumonia.  Non toxic appearing Not requiring O2. No fevers. Pt has fup for HIV.  Plan for evaluation for pulm edema, pneumonia and  blood work.  Reexamination, no changes, updated, comfortable. Spoke with Dr Toniann Fail, accepted tele.  Paged nephro.  The patients results and plan were reviewed and discussed.   Any x-rays performed were personally reviewed by myself.   Differential diagnosis were considered with the presenting HPI.  Diagnosis: above  EKG: reviewed, no peaked T waves K resent  Admission/ observation were discussed with the admitting physician, patient and/or family and they are comfortable with the plan.  Filed Vitals:   09/29/13 2300  BP: 192/109  Pulse: 105  Resp: 14  SpO2: 94%       Enid Skeens, MD 09/30/13 [phone]

## 2013-09-29 NOTE — ED Notes (Signed)
2 RN's unable to start IV.

## 2013-09-29 NOTE — ED Notes (Signed)
Patient has been experiencing difficulty breathing x1 month. Patient is a dialysis patient and has not been to dialysis since last Monday 12/15. Patient has a productive cough with fever aches and chills. Patient has been non-compliant with medications. Patient has hx of CHF, HTN and renal failure. Patient is 94% on RA after albuterol tx from EMS.

## 2013-09-30 ENCOUNTER — Encounter (HOSPITAL_COMMUNITY): Payer: Self-pay | Admitting: Internal Medicine

## 2013-09-30 DIAGNOSIS — E875 Hyperkalemia: Secondary | ICD-10-CM

## 2013-09-30 DIAGNOSIS — R0609 Other forms of dyspnea: Secondary | ICD-10-CM

## 2013-09-30 DIAGNOSIS — Z21 Asymptomatic human immunodeficiency virus [HIV] infection status: Secondary | ICD-10-CM

## 2013-09-30 DIAGNOSIS — I10 Essential (primary) hypertension: Secondary | ICD-10-CM

## 2013-09-30 DIAGNOSIS — N186 End stage renal disease: Secondary | ICD-10-CM

## 2013-09-30 DIAGNOSIS — D649 Anemia, unspecified: Secondary | ICD-10-CM

## 2013-09-30 DIAGNOSIS — J810 Acute pulmonary edema: Secondary | ICD-10-CM | POA: Diagnosis present

## 2013-09-30 DIAGNOSIS — J811 Chronic pulmonary edema: Secondary | ICD-10-CM | POA: Diagnosis present

## 2013-09-30 HISTORY — DX: End stage renal disease: N18.6

## 2013-09-30 LAB — CBC WITH DIFFERENTIAL/PLATELET
Basophils Absolute: 0 10*3/uL (ref 0.0–0.1)
Basophils Absolute: 0 10*3/uL (ref 0.0–0.1)
Basophils Relative: 0 % (ref 0–1)
Basophils Relative: 0 % (ref 0–1)
Eosinophils Absolute: 0.1 10*3/uL (ref 0.0–0.7)
HCT: 24.8 % — ABNORMAL LOW (ref 36.0–46.0)
Hemoglobin: 8.4 g/dL — ABNORMAL LOW (ref 12.0–15.0)
Hemoglobin: 9.3 g/dL — ABNORMAL LOW (ref 12.0–15.0)
Lymphocytes Relative: 30 % (ref 12–46)
Lymphs Abs: 2.3 10*3/uL (ref 0.7–4.0)
MCH: 35 pg — ABNORMAL HIGH (ref 26.0–34.0)
MCHC: 33.9 g/dL (ref 30.0–36.0)
MCHC: 33.9 g/dL (ref 30.0–36.0)
Monocytes Absolute: 0.7 10*3/uL (ref 0.1–1.0)
Monocytes Relative: 10 % (ref 3–12)
Monocytes Relative: 9 % (ref 3–12)
Neutro Abs: 4.7 10*3/uL (ref 1.7–7.7)
Neutro Abs: 5.6 10*3/uL (ref 1.7–7.7)
Neutrophils Relative %: 60 % (ref 43–77)
Neutrophils Relative %: 65 % (ref 43–77)
Platelets: 256 10*3/uL (ref 150–400)
Platelets: 287 10*3/uL (ref 150–400)
RDW: 13.5 % (ref 11.5–15.5)
RDW: 13.6 % (ref 11.5–15.5)
WBC: 7.9 10*3/uL (ref 4.0–10.5)

## 2013-09-30 LAB — BASIC METABOLIC PANEL
CO2: 17 mEq/L — ABNORMAL LOW (ref 19–32)
Calcium: 9.3 mg/dL (ref 8.4–10.5)
Chloride: 95 mEq/L — ABNORMAL LOW (ref 96–112)
Creatinine, Ser: 17.28 mg/dL — ABNORMAL HIGH (ref 0.50–1.10)
GFR calc Af Amer: 2 mL/min — ABNORMAL LOW (ref >90)
GFR calc Af Amer: 2 mL/min — ABNORMAL LOW (ref >90)
GFR calc non Af Amer: 2 mL/min — ABNORMAL LOW (ref >90)
GFR calc non Af Amer: 2 mL/min — ABNORMAL LOW (ref >90)
Glucose, Bld: 109 mg/dL — ABNORMAL HIGH (ref 70–99)
Potassium: 6.2 mEq/L — ABNORMAL HIGH (ref 3.5–5.1)
Potassium: 5.5 mEq/L — ABNORMAL HIGH (ref 3.5–5.1)
Sodium: 136 mEq/L (ref 135–145)

## 2013-09-30 LAB — MRSA PCR SCREENING: MRSA by PCR: NEGATIVE

## 2013-09-30 LAB — PRO B NATRIURETIC PEPTIDE: Pro B Natriuretic peptide (BNP): 31055 pg/mL — ABNORMAL HIGH (ref 0–125)

## 2013-09-30 MED ORDER — NEPRO/CARBSTEADY PO LIQD
237.0000 mL | ORAL | Status: DC | PRN
  Filled 2013-09-30: qty 237

## 2013-09-30 MED ORDER — HEPARIN SODIUM (PORCINE) 1000 UNIT/ML DIALYSIS: 20.0000 [IU]/kg | INTRAMUSCULAR | Status: DC | PRN

## 2013-09-30 MED ORDER — SEVELAMER CARBONATE 800 MG PO TABS
2400.0000 mg | ORAL_TABLET | Freq: Three times a day (TID) | ORAL | Status: DC
  Administered 2013-09-30: 2400 mg via ORAL
  Filled 2013-09-30 (×7): qty 3

## 2013-09-30 MED ORDER — SODIUM CHLORIDE 0.9 % IJ SOLN: 3.0000 mL | Freq: Two times a day (BID) | INTRAMUSCULAR | Status: DC

## 2013-09-30 MED ORDER — DARBEPOETIN ALFA-POLYSORBATE 60 MCG/0.3ML IJ SOLN
INTRAMUSCULAR | Status: AC
  Filled 2013-09-30: qty 0.3

## 2013-09-30 MED ORDER — SEVELAMER CARBONATE 800 MG PO TABS
1600.0000 mg | ORAL_TABLET | Freq: Two times a day (BID) | ORAL | Status: DC | PRN
  Administered 2013-09-30: 1600 mg via ORAL
  Filled 2013-09-30: qty 2

## 2013-09-30 MED ORDER — CARVEDILOL 6.25 MG PO TABS
6.2500 mg | ORAL_TABLET | Freq: Two times a day (BID) | ORAL | Status: DC
  Administered 2013-09-30: 6.25 mg via ORAL
  Filled 2013-09-30 (×5): qty 1

## 2013-09-30 MED ORDER — PANTOPRAZOLE SODIUM 40 MG PO TBEC
40.0000 mg | DELAYED_RELEASE_TABLET | Freq: Every day | ORAL | Status: DC
  Administered 2013-09-30: 40 mg via ORAL
  Filled 2013-09-30: qty 1

## 2013-09-30 MED ORDER — CINACALCET HCL 30 MG PO TABS
60.0000 mg | ORAL_TABLET | Freq: Every day | ORAL | Status: DC
  Administered 2013-09-30: 60 mg via ORAL
  Filled 2013-09-30 (×3): qty 2

## 2013-09-30 MED ORDER — SODIUM CHLORIDE 0.9 % IJ SOLN
3.0000 mL | Freq: Two times a day (BID) | INTRAMUSCULAR | Status: DC
  Administered 2013-09-30: 3 mL via INTRAVENOUS

## 2013-09-30 MED ORDER — LIDOCAINE HCL (PF) 1 % IJ SOLN: 5.0000 mL | INTRAMUSCULAR | Status: DC | PRN

## 2013-09-30 MED ORDER — DARBEPOETIN ALFA-POLYSORBATE 60 MCG/0.3ML IJ SOLN: 60.0000 ug | INTRAMUSCULAR | Status: DC

## 2013-09-30 MED ORDER — HEPARIN SODIUM (PORCINE) 5000 UNIT/ML IJ SOLN
5000.0000 [IU] | Freq: Three times a day (TID) | INTRAMUSCULAR | Status: DC
  Administered 2013-09-30: 5000 [IU] via SUBCUTANEOUS
  Filled 2013-09-30 (×7): qty 1

## 2013-09-30 MED ORDER — ACETAMINOPHEN 650 MG RE SUPP: 650.0000 mg | Freq: Four times a day (QID) | RECTAL | Status: DC | PRN

## 2013-09-30 MED ORDER — PENTAFLUOROPROP-TETRAFLUOROETH EX AERO: 1.0000 "application " | INHALATION_SPRAY | CUTANEOUS | Status: DC | PRN

## 2013-09-30 MED ORDER — SODIUM CHLORIDE 0.9 % IV SOLN: 100.0000 mL | INTRAVENOUS | Status: DC | PRN

## 2013-09-30 MED ORDER — AMITRIPTYLINE HCL 10 MG PO TABS
20.0000 mg | ORAL_TABLET | Freq: Every day | ORAL | Status: DC
  Administered 2013-09-30: 20 mg via ORAL
  Filled 2013-09-30 (×2): qty 2

## 2013-09-30 MED ORDER — HYDRALAZINE HCL 20 MG/ML IJ SOLN: 10.0000 mg | INTRAMUSCULAR | Status: DC | PRN

## 2013-09-30 MED ORDER — TENOFOVIR DISOPROXIL FUMARATE 300 MG PO TABS: 300.0000 mg | ORAL_TABLET | ORAL | Status: DC

## 2013-09-30 MED ORDER — ALTEPLASE 2 MG IJ SOLR
2.0000 mg | Freq: Once | INTRAMUSCULAR | Status: DC | PRN
  Filled 2013-09-30: qty 2

## 2013-09-30 MED ORDER — SODIUM POLYSTYRENE SULFONATE 15 GM/60ML PO SUSP
15.0000 g | Freq: Once | ORAL | Status: AC
  Administered 2013-09-30: 15 g via ORAL
  Filled 2013-09-30: qty 60

## 2013-09-30 MED ORDER — SODIUM CHLORIDE 0.9 % IV SOLN
125.0000 mg | INTRAVENOUS | Status: DC
  Administered 2013-09-30: 125 mg via INTRAVENOUS
  Filled 2013-09-30 (×2): qty 10

## 2013-09-30 MED ORDER — LAMIVUDINE 10 MG/ML PO SOLN
50.0000 mg | Freq: Every day | ORAL | Status: DC
  Administered 2013-09-30: 50 mg via ORAL
  Filled 2013-09-30 (×2): qty 5

## 2013-09-30 MED ORDER — LAMIVUDINE 150 MG PO TABS
150.0000 mg | ORAL_TABLET | Freq: Every day | ORAL | Status: DC
  Filled 2013-09-30 (×2): qty 1

## 2013-09-30 MED ORDER — ONDANSETRON HCL 4 MG PO TABS: 4.0000 mg | ORAL_TABLET | Freq: Four times a day (QID) | ORAL | Status: DC | PRN

## 2013-09-30 MED ORDER — HEPARIN SODIUM (PORCINE) 1000 UNIT/ML DIALYSIS: 1000.0000 [IU] | INTRAMUSCULAR | Status: DC | PRN

## 2013-09-30 MED ORDER — ONDANSETRON HCL 4 MG/2ML IJ SOLN: 4.0000 mg | Freq: Four times a day (QID) | INTRAMUSCULAR | Status: DC | PRN

## 2013-09-30 MED ORDER — LIDOCAINE-PRILOCAINE 2.5-2.5 % EX CREA
1.0000 "application " | TOPICAL_CREAM | CUTANEOUS | Status: DC | PRN
  Filled 2013-09-30: qty 5

## 2013-09-30 MED ORDER — RALTEGRAVIR POTASSIUM 400 MG PO TABS
400.0000 mg | ORAL_TABLET | Freq: Two times a day (BID) | ORAL | Status: DC
  Administered 2013-09-30 (×2): 400 mg via ORAL
  Filled 2013-09-30 (×4): qty 1

## 2013-09-30 MED ORDER — ACETAMINOPHEN 325 MG PO TABS
650.0000 mg | ORAL_TABLET | Freq: Four times a day (QID) | ORAL | Status: DC | PRN
  Administered 2013-09-30 – 2013-10-01 (×3): 650 mg via ORAL
  Filled 2013-09-30 (×4): qty 2

## 2013-09-30 MED ORDER — AMLODIPINE BESYLATE 10 MG PO TABS
10.0000 mg | ORAL_TABLET | Freq: Every day | ORAL | Status: DC
  Administered 2013-09-30: 10 mg via ORAL
  Filled 2013-09-30 (×2): qty 1

## 2013-09-30 NOTE — Progress Notes (Signed)
Pt admitted to the unit. Pt is alert and oriented. Pt oriented to room, staff, and call bell. Educated on fall safety plan. Bed in lowest position. Full assessment to Epic. Call bell with in reach. Educated to call for assists. Will continue to monitor. Page to admitting physician for notification of patient arrival to the floor.   CRITICAL VALUE ALERT  Critical value received:  K=6.3  Date of notification:  09/30/2013  Time of notification:  0350  Critical value read back:yes  Nurse who received alert:  Burley Saver, RN   MD notified (1st page):  Dr. Toniann Fail to unit and aware, Orders for Kayexalate  Burley Saver, RN

## 2013-09-30 NOTE — Progress Notes (Signed)
Utilization review completed. Nikoli Nasser, RN, BSN. 

## 2013-09-30 NOTE — Progress Notes (Signed)
[facility]                                                                                Patient Demographics  [name], is a 62 y.o. female, DOB - 1951-06-22, WUJ:[id number]  Admit date - 09/29/2013   Admitting Physician Eduard Clos, MD  Outpatient Primary MD for the patient is CAMPBELL, Mila Homer., MD  LOS - 1   Chief Complaint  Patient presents with  . Shortness of Breath        Assessment & Plan    Principal Problem:   Pulmonary edema, acute Active Problems:   HIV infection   Hypertension   Anemia   Hyperkalemia   ESRD (end stage renal disease) on dialysis   Pulmonary edema  Patient seen examined. She does admit to being noncompliant with her dialysis and has missed a few sessions. She presents with shortness of breath secondary pulmonary edema. Agree with current assessment and plan. Will continue current management.

## 2013-09-30 NOTE — ED Notes (Signed)
IV team paged.  

## 2013-09-30 NOTE — Procedures (Signed)
Patient was seen on dialysis and the procedure was supervised. BFR 450 Via LFA AVF BP is 201/124.  Patient appears to be tolerating treatment well but will increase goal UF as she is 6kg above EDW.

## 2013-09-30 NOTE — Consult Note (Signed)
[facility] Renal Consultation Note    Indication for Consultation:  Management of ESRD/hemodialysis; anemia, hypertension/volume and secondary hyperparathyroidism  HPI: [name] is a 62 y.o. female.  Pt is a 62yo AAF with PMH sig for HTN, HIV, CHF, polysubstance abuse, medical noncompliance and ESRD who presents with SOB after not going to her outpt HD in [place] for the last week.  Pt reports that she is in the process of moving and has an "uncertain" living arrangements.  She was recently admitted to [facility] in October with the same presentation and here in March 2014.  We were asked to help manage her ESRD and volume overload as well as her medical complications from ESRD including hyperkalemia, HTN, and anemia.  Past Medical History  Diagnosis Date  . Hypertension   . HIV infection   . Chronic renal failure   . CHF (congestive heart failure)   . ESRD (end stage renal disease)    Past Surgical History  Procedure Laterality Date  . Arteriovenous graft placement     History reviewed. No pertinent family history. Social History:  reports that she has been smoking.  She does not have any smokeless tobacco history on file. She reports that she drinks alcohol. She reports that she uses illicit drugs (Cocaine). Allergies  Allergen Reactions  . Morphine And Related Hives and Rash   Family History: History reviewed. No pertinent family history.    Prior to Admission medications   Medication Sig Start Date End Date Taking? Authorizing Provider  amitriptyline (ELAVIL) 10 MG tablet Take 20 mg by mouth at bedtime.   Yes Historical Provider, MD  amLODipine (NORVASC) 10 MG tablet Take 10 mg by mouth daily.   Yes Historical Provider, MD  carvedilol (COREG) 6.25 MG tablet Take 6.25 mg by mouth 2 (two) times daily with a meal.   Yes Historical Provider, MD  cinacalcet (SENSIPAR) 60 MG tablet Take 60 mg by mouth daily.   Yes Historical Provider, MD  lamiVUDine  (EPIVIR) 150 MG tablet Take 150 mg by mouth daily before breakfast.    Yes Historical Provider, MD  omeprazole (PRILOSEC) 20 MG capsule Take 20 mg by mouth daily.   Yes Historical Provider, MD  raltegravir (ISENTRESS) 400 MG tablet Take 400 mg by mouth 2 (two) times daily.    Yes Historical Provider, MD  sevelamer carbonate (RENVELA) 800 MG tablet Take 1,600-2,400 mg by mouth 5 (five) times daily. 3 Tabs with meals and 2 tabs with snacks   Yes Historical Provider, MD  tenofovir (VIREAD) 300 MG tablet Take 300 mg by mouth every Friday. Takes on Fridays.   Yes Historical Provider, MD   Current Facility-Administered Medications  Medication Dose Route Frequency Provider Last Rate Last Dose  . 0.9 %  sodium chloride infusion  100 mL Intravenous PRN Cecille Aver, MD      . 0.9 %  sodium chloride infusion  100 mL Intravenous PRN Cecille Aver, MD      . acetaminophen (TYLENOL) tablet 650 mg  650 mg Oral Q6H PRN Eduard Clos, MD   650 mg at 09/30/13 1610   Or  . acetaminophen (TYLENOL) suppository 650 mg  650 mg Rectal Q6H PRN Eduard Clos, MD      . alteplase (CATHFLO ACTIVASE) injection 2 mg  2 mg Intracatheter Once PRN Cecille Aver, MD      . amitriptyline (ELAVIL) tablet 20 mg  20 mg Oral QHS Eduard Clos, MD      .  amLODipine (NORVASC) tablet 10 mg  10 mg Oral Daily Eduard Clos, MD      . carvedilol (COREG) tablet 6.25 mg  6.25 mg Oral BID WC Eduard Clos, MD      . cinacalcet (SENSIPAR) tablet 60 mg  60 mg Oral Q breakfast Eduard Clos, MD      . feeding supplement (NEPRO CARB STEADY) liquid 237 mL  237 mL Oral PRN Cecille Aver, MD      . heparin injection 1,000 Units  1,000 Units Dialysis PRN Cecille Aver, MD      . heparin injection 2,300 Units  20 Units/kg Dialysis PRN Cecille Aver, MD      . heparin injection 5,000 Units  5,000 Units Subcutaneous Q8H Eduard Clos, MD      . hydrALAZINE  (APRESOLINE) injection 10 mg  10 mg Intravenous Q4H PRN Eduard Clos, MD      . lamiVUDine (EPIVIR) 10 MG/ML solution 50 mg  50 mg Oral Daily Anh P Pham, RPH      . lidocaine (PF) (XYLOCAINE) 1 % injection 5 mL  5 mL Intradermal PRN Cecille Aver, MD      . lidocaine-prilocaine (EMLA) cream 1 application  1 application Topical PRN Cecille Aver, MD      . ondansetron (ZOFRAN) tablet 4 mg  4 mg Oral Q6H PRN Eduard Clos, MD       Or  . ondansetron (ZOFRAN) injection 4 mg  4 mg Intravenous Q6H PRN Eduard Clos, MD      . pantoprazole (PROTONIX) EC tablet 40 mg  40 mg Oral Daily Eduard Clos, MD      . pentafluoroprop-tetrafluoroeth (GEBAUERS) aerosol 1 application  1 application Topical PRN Cecille Aver, MD      . raltegravir (ISENTRESS) tablet 400 mg  400 mg Oral BID Eduard Clos, MD      . sevelamer carbonate (RENVELA) tablet 1,600 mg  1,600 mg Oral BID PRN Eduard Clos, MD      . sevelamer carbonate (RENVELA) tablet 2,400 mg  2,400 mg Oral TID WC Eduard Clos, MD   2,400 mg at 09/30/13 0831  . sodium chloride 0.9 % injection 3 mL  3 mL Intravenous Q12H Eduard Clos, MD      . sodium chloride 0.9 % injection 3 mL  3 mL Intravenous Q12H Eduard Clos, MD      . Melene Muller ON 10/03/2013] tenofovir (VIREAD) tablet 300 mg  300 mg Oral Q Fri Eduard Clos, MD       Labs: Basic Metabolic Panel:  Recent Labs Lab 09/29/13 2359 09/30/13 0131 09/30/13 0645  NA 136  --  139  K 6.2* 6.3* 5.5*  CL 94*  --  95*  CO2 17*  --  18*  GLUCOSE 109*  --  96  BUN 95*  --  96*  CREATININE 16.19*  --  17.28*  CALCIUM 9.3  --  9.5   Liver Function Tests: No results found for this basename: AST, ALT, ALKPHOS, BILITOT, PROT, ALBUMIN,  in the last 168 hours No results found for this basename: LIPASE, AMYLASE,  in the last 168 hours No results found for this basename: AMMONIA,  in the last 168 hours CBC:  Recent  Labs Lab 09/29/13 2359 09/30/13 0645  WBC 8.7 7.9  NEUTROABS 5.6 4.7  HGB 9.3* 8.4*  HCT 27.4* 24.8*  MCV 103.0* 103.3*  PLT  287 256   Cardiac Enzymes:  Recent Labs Lab 09/29/13 2359  TROPONINI <0.30   CBG: No results found for this basename: GLUCAP,  in the last 168 hours Iron Studies: No results found for this basename: IRON, TIBC, TRANSFERRIN, FERRITIN,  in the last 72 hours Studies/Results: Dg Chest 2 View  09/30/2013   CLINICAL DATA:  Short of breath.  Congestion.  Dizziness.  EXAM: CHEST  2 VIEW  COMPARISON:  07/31/2013.  FINDINGS: Cardiopericardial silhouette is mildly enlarged. Lung volumes are low. Tortuous thoracic aorta with atherosclerosis. Scattered areas of subsegmental atelectasis. There is new pulmonary vascular congestion. Interstitial opacity is present compatible with edema.  IMPRESSION: Constellation of findings compatible with mild CHF.   Electronically Signed   By: Andreas Newport M.D.   On: 09/30/2013 00:00    ROS: A comprehensive review of systems was negative except for: Constitutional: positive for fatigue Respiratory: positive for cough, dyspnea on exertion and SOB Physical Exam: Filed Vitals:   09/30/13 1023 09/30/13 1033 09/30/13 1100 09/30/13 1130  BP: 206/120 194/113 178/99 209/124  Pulse: 106 104 102 107  Temp: 98.2 F (36.8 C)     TempSrc: Oral     Resp: 23 16 16 15   Weight: 118.1 kg (260 lb 5.8 oz)     SpO2: 95%         Weight change:   Intake/Output Summary (Last 24 hours) at 09/30/13 1156 Last data filed at 09/30/13 0900  Gross per 24 hour  Intake    240 ml  Output      0 ml  Net    240 ml   BP 209/124  Pulse 107  Temp(Src) 98.2 F (36.8 C) (Oral)  Resp 15  Wt 118.1 kg (260 lb 5.8 oz)  SpO2 95% General appearance: no distress and slowed mentation Head: Normocephalic, without obvious abnormality, atraumatic Resp: rhonchi bilaterally Cardio: tachy at 110, no rub GI: soft, non-tender; bowel sounds normal; no masses,  no  organomegaly Extremities: no edema, redness or tenderness in the calves or thighs and LFA AVF +T/B Dialysis Access:  Dialysis Orders: Center: [facility] - Closed  on MWF . EDW 112 HD Bath 2K/2.5Ca  Time 4hours Heparin 1000. Access LFA AVF BFR 450 DFR autoflow    Epogen 2200   Units IV/HD  Venofer  100mg  IV qtx X 10 until 10/20/13   Assessment/Plan: 1.  Urgent HTN and pulmonary edema: due to non-adherence with HD and ongoing polysubstance abuse.  Plan for urgent HD with UF (pt is 6kg above EDW) and follow resp status and BP 2.  ESRD -  As above, cont with HD MWF (currently on holiday schedule Sun/Tues/Thurs) but may need extra treatment tomorrow if still with volume overload and HTN 3. Hyperkalemia: due to noncompliance.  Treat with HD 4.  Hypertension/volume  - as above.  Will increase goal UF to 5L with HD as her BP remains elevated. Resume meds but should probably not be on coreg with ongoing cocaine use.  Recommend labetalol to have both alpha and beta blockade. 5.  Anemia  - on EPO 2,200 units IVP tiw with HD, as well as venofer 100mg  IV x10 treatments thru 10/20/13 6.  Metabolic bone disease -  On Sensipar and renvela 7.  Vascular access: stable 8. HIV: continue with meds  @MBS[employer] @ 09/30/2013, 11:56 AM

## 2013-09-30 NOTE — H&P (Signed)
[facility] History and Physical  [name] WUJ:[id number] DOB: 04-22-1951 DOA: 09/29/2013  Referring physician: ER physician. PCP: Wilmer Floor., MD   Chief Complaint: Shortness of breath.  HPI: [name] is a 62 y.o. female with known history of ESRD on hemodialysis, HIV, chronic anemia, hypertension presented to the ER because of shortness of breath. Patient has been feeling short of breath last 24 hours. Patient's shortness of breath increased on exertion denies any chest pain fever chills. Has drank and productive cough. In the ER chest x-ray shows features consistent with pulmonary edema. Patient states she has not had dialysis for last one week. Patient's labs also revealed elevated potassium around 6.2. EKG does not show any peaked T waves. Patient has been admitted for further management. Patient is not in acute distress. Denies any nausea vomiting abdominal pain.   Review of Systems: As presented in the history of presenting illness, rest negative.  Past Medical History  Diagnosis Date  . Hypertension   . HIV infection   . Chronic renal failure   . CHF (congestive heart failure)   . ESRD (end stage renal disease)    Past Surgical History  Procedure Laterality Date  . Arteriovenous graft placement     Social History:  reports that she has been smoking.  She does not have any smokeless tobacco history on file. She reports that she drinks alcohol. She reports that she uses illicit drugs (Cocaine). Where does patient live home. Can patient participate in ADLs? Yes.  Allergies  Allergen Reactions  . Morphine And Related Hives and Rash    Family History: History reviewed. No pertinent family history.    Prior to Admission medications   Medication Sig Start Date End Date Taking? Authorizing Provider  amitriptyline (ELAVIL) 10 MG tablet Take 20 mg by mouth at bedtime.   Yes Historical Provider, MD  amLODipine (NORVASC) 10 MG tablet Take 10 mg by  mouth daily.   Yes Historical Provider, MD  carvedilol (COREG) 6.25 MG tablet Take 6.25 mg by mouth 2 (two) times daily with a meal.   Yes Historical Provider, MD  cinacalcet (SENSIPAR) 60 MG tablet Take 60 mg by mouth daily.   Yes Historical Provider, MD  lamiVUDine (EPIVIR) 150 MG tablet Take 150 mg by mouth daily before breakfast.    Yes Historical Provider, MD  omeprazole (PRILOSEC) 20 MG capsule Take 20 mg by mouth daily.   Yes Historical Provider, MD  raltegravir (ISENTRESS) 400 MG tablet Take 400 mg by mouth 2 (two) times daily.    Yes Historical Provider, MD  sevelamer carbonate (RENVELA) 800 MG tablet Take 1,600-2,400 mg by mouth 5 (five) times daily. 3 Tabs with meals and 2 tabs with snacks   Yes Historical Provider, MD  tenofovir (VIREAD) 300 MG tablet Take 300 mg by mouth every Friday. Takes on Fridays.   Yes Historical Provider, MD    Physical Exam: Filed Vitals:   09/29/13 2300 09/30/13 0230 09/30/13 0342  BP: 192/109 183/102 183/99  Pulse: 105 96 105  Temp:   97.6 F (36.4 C)  TempSrc:   Oral  Resp: 14 21 18   Weight:   117.073 kg (258 lb 1.6 oz)  SpO2: 94% 94% 93%     General:  Well-developed and nourished.  Eyes: Anicteric no pallor.  ENT: No discharge from the ears eyes nose mouth.  Neck: No mass felt.  Cardiovascular: S1-S2 heard.  Respiratory: No rhonchi or crepitations.  Abdomen: Soft nontender bowel sounds present.  Skin: No rash.  Musculoskeletal: No edema.  Psychiatric: Appears normal.  Neurologic: Alert awake oriented to time place and person. Moves all extremities.  Labs on Admission:  Basic Metabolic Panel:  Recent Labs Lab 09/29/13 2359 09/30/13 0131  NA 136  --   K 6.2* 6.3*  CL 94*  --   CO2 17*  --   GLUCOSE 109*  --   BUN 95*  --   CREATININE 16.19*  --   CALCIUM 9.3  --    Liver Function Tests: No results found for this basename: AST, ALT, ALKPHOS, BILITOT, PROT, ALBUMIN,  in the last 168 hours No results found for this  basename: LIPASE, AMYLASE,  in the last 168 hours No results found for this basename: AMMONIA,  in the last 168 hours CBC:  Recent Labs Lab 09/29/13 2359  WBC 8.7  NEUTROABS 5.6  HGB 9.3*  HCT 27.4*  MCV 103.0*  PLT 287   Cardiac Enzymes:  Recent Labs Lab 09/29/13 2359  TROPONINI <0.30    BNP (last 3 results)  Recent Labs  07/31/13 1108 09/29/13 2359  PROBNP 4928.0* 31055.0*   CBG: No results found for this basename: GLUCAP,  in the last 168 hours  Radiological Exams on Admission: Dg Chest 2 View  09/30/2013   CLINICAL DATA:  Short of breath.  Congestion.  Dizziness.  EXAM: CHEST  2 VIEW  COMPARISON:  07/31/2013.  FINDINGS: Cardiopericardial silhouette is mildly enlarged. Lung volumes are low. Tortuous thoracic aorta with atherosclerosis. Scattered areas of subsegmental atelectasis. There is new pulmonary vascular congestion. Interstitial opacity is present compatible with edema.  IMPRESSION: Constellation of findings compatible with mild CHF.   Electronically Signed   By: Andreas Newport M.D.   On: 09/30/2013 00:00    EKG: Independently reviewed. Sinus tachycardia.  Assessment/Plan Principal Problem:   Pulmonary edema, acute Active Problems:   HIV infection   Hypertension   Anemia   Hyperkalemia   ESRD (end stage renal disease) on dialysis   Pulmonary edema   1. Pulmonary edema probably secondary to noncardiac causes secondary to fluid overload in a patient not compliant with dialysis -  At this time patient is not in acute distress. I will consult nephrology for dialysis in a.m. Patient's potassium is around 6.2. Have ordered one dose of Kayexalate 15 g. EKG does not show any peaked T waves. Have stressed the importance of being compliant with dialysis. 2. ESRD on hemodialysis with hyperkalemia - see #1. 3. HIV - patient does not recall her last CD4 count and states that she has not been the infectious disease clinic for last one year. She usually follows at  [facility]. Continue present antiretrovirals. 4. Hypertension uncontrolled - continue home medications. I have placed patient on when necessary IV hydralazine for systolic blood pressure more than 160. I think patient's blood pressure will improve with dialysis. 5. Anemia probably from ESRD - follow CBC.    Code Status: Full code.  Family Communication: None.  Disposition Plan: Admit to inpatient.    [name] N. [facility] Pager [phone].  If 7PM-7AM, please contact night-coverage [email or web address] Password [facility] 09/30/2013, 5:01 AM

## 2013-10-01 NOTE — Progress Notes (Signed)
Late entry . Patient yelling at staff this am she wanted to be discharged to go home now. Dr. Robb Matar aware and attempted to talk with patient . Patient reported she was not going to do anything until she was going home. Patient would not take am medications . Bed alarm remained activated while patient on unit. Patient called family and notified them to come and get her. Reviewed with patient rationale to continue to receive assistance from hospital . Patient continued to refuse to stay . Patient signed document leaving against medical advise. Dr. Robb Matar aware .   Cleotilde Neer

## 2013-10-01 NOTE — Plan of Care (Signed)
Problem: Safety Goal: General Medical Patient Education Outcome: Not Progressing Patient left AMA . Reported she knows "what to do at home "

## 2013-10-01 NOTE — Progress Notes (Signed)
Patient complained of headaches this shift Dr. Izola Price paged. Orders given for Vicodin 5-325 q6 hrs  Prn . Patient is allergic to Morphine and related drugs. The patient refused to wear her Tele leads until she was able to speak to a doctor.  Patient stated that she fell in the floor when the oncoming nurse arrived in the room. The patient is unable to stand on her own or get up on her own. The patient was in the bed when she stated that she fell. Shortly after, she threaten to leave the hospital AMA. The physician was made aware. adm

## 2013-10-06 ENCOUNTER — Emergency Department (HOSPITAL_COMMUNITY): Payer: Medicare Other

## 2013-10-06 ENCOUNTER — Emergency Department (HOSPITAL_COMMUNITY)
Admission: EM | Admit: 2013-10-06 | Discharge: 2013-10-06 | Disposition: A | Discharge status: Home / Self Care | Payer: Medicare Other | Attending: Emergency Medicine | Admitting: Emergency Medicine

## 2013-10-06 ENCOUNTER — Encounter (HOSPITAL_COMMUNITY): Payer: Self-pay | Admitting: Emergency Medicine

## 2013-10-06 DIAGNOSIS — Z79899 Other long term (current) drug therapy: Secondary | ICD-10-CM | POA: Insufficient documentation

## 2013-10-06 DIAGNOSIS — R079 Chest pain, unspecified: Secondary | ICD-10-CM | POA: Insufficient documentation

## 2013-10-06 DIAGNOSIS — N186 End stage renal disease: Secondary | ICD-10-CM | POA: Insufficient documentation

## 2013-10-06 DIAGNOSIS — F17200 Nicotine dependence, unspecified, uncomplicated: Secondary | ICD-10-CM | POA: Insufficient documentation

## 2013-10-06 DIAGNOSIS — R05 Cough: Secondary | ICD-10-CM | POA: Insufficient documentation

## 2013-10-06 DIAGNOSIS — M25559 Pain in unspecified hip: Secondary | ICD-10-CM | POA: Insufficient documentation

## 2013-10-06 DIAGNOSIS — I509 Heart failure, unspecified: Secondary | ICD-10-CM | POA: Insufficient documentation

## 2013-10-06 DIAGNOSIS — R0602 Shortness of breath: Secondary | ICD-10-CM | POA: Insufficient documentation

## 2013-10-06 DIAGNOSIS — Z21 Asymptomatic human immunodeficiency virus [HIV] infection status: Secondary | ICD-10-CM | POA: Insufficient documentation

## 2013-10-06 DIAGNOSIS — R059 Cough, unspecified: Secondary | ICD-10-CM | POA: Insufficient documentation

## 2013-10-06 DIAGNOSIS — I120 Hypertensive chronic kidney disease with stage 5 chronic kidney disease or end stage renal disease: Secondary | ICD-10-CM | POA: Insufficient documentation

## 2013-10-06 LAB — BASIC METABOLIC PANEL
BUN: 70 mg/dL — ABNORMAL HIGH (ref 6–23)
CO2: 25 mEq/L (ref 19–32)
Calcium: 9.3 mg/dL (ref 8.4–10.5)
Chloride: 92 mEq/L — ABNORMAL LOW (ref 96–112)
Creatinine, Ser: 13.15 mg/dL — ABNORMAL HIGH (ref 0.50–1.10)
GFR calc Af Amer: 3 mL/min — ABNORMAL LOW (ref >90)
GFR calc non Af Amer: 3 mL/min — ABNORMAL LOW (ref >90)
Glucose, Bld: 101 mg/dL — ABNORMAL HIGH (ref 70–99)
Potassium: 5.4 mEq/L — ABNORMAL HIGH (ref 3.5–5.1)
Sodium: 136 mEq/L (ref 135–145)

## 2013-10-06 LAB — CBC WITH DIFFERENTIAL/PLATELET
Basophils Relative: 0 % (ref 0–1)
Eosinophils Relative: 1 % (ref 0–5)
HCT: 27.6 % — ABNORMAL LOW (ref 36.0–46.0)
Hemoglobin: 9.2 g/dL — ABNORMAL LOW (ref 12.0–15.0)
Lymphocytes Relative: 36 % (ref 12–46)
Lymphs Abs: 3 10*3/uL (ref 0.7–4.0)
MCH: 34.8 pg — ABNORMAL HIGH (ref 26.0–34.0)
MCHC: 33.3 g/dL (ref 30.0–36.0)
MCV: 104.5 fL — ABNORMAL HIGH (ref 78.0–100.0)
Monocytes Relative: 9 % (ref 3–12)
Neutro Abs: 4.4 10*3/uL (ref 1.7–7.7)
Neutrophils Relative %: 53 % (ref 43–77)
Platelets: 271 10*3/uL (ref 150–400)
RDW: 13.4 % (ref 11.5–15.5)

## 2013-10-06 LAB — PRO B NATRIURETIC PEPTIDE: Pro B Natriuretic peptide (BNP): 31834 pg/mL — ABNORMAL HIGH (ref 0–125)

## 2013-10-06 LAB — POCT I-STAT TROPONIN I: Troponin i, poc: 0.01 ng/mL (ref 0.00–0.08)

## 2013-10-06 MED ORDER — FENTANYL CITRATE 0.05 MG/ML IJ SOLN
100.0000 ug | Freq: Once | INTRAMUSCULAR | Status: AC
  Administered 2013-10-06: 100 ug via INTRAVENOUS
  Filled 2013-10-06: qty 2

## 2013-10-06 NOTE — ED Notes (Signed)
Pt transported to waiting area via wheel chair by Scarlet RN per pt;s request, pt is leaving AMA, refuses to sign paper work or give a pain scale. Pt a/o x4, rr even and unlabored. NAD

## 2013-10-06 NOTE — ED Notes (Signed)
Pt yelling from room requesting a comb, warm blanket, bed to be changed, coke, IV to be removed, clothes to get dressed and a wheel chair to get out of here. Pt requesting to leave AMA. Pt also requesting case worker. Crecencio Mc RN called for case management

## 2013-10-06 NOTE — ED Provider Notes (Signed)
Medical screening examination/treatment/procedure(s) were performed by non-physician practitioner and as supervising physician I was immediately available for consultation/collaboration.  EKG Interpretation   None         Rachele Lamaster B. Renetta Suman, MD 10/06/13 1041 

## 2013-10-06 NOTE — ED Provider Notes (Signed)
CSN: [id number]     Arrival date & time 10/06/13  0229 History   First MD Initiated Contact with Patient 10/06/13 [phone]     Chief Complaint  Patient presents with  . Chest Pain  . Shortness of Breath   (Consider location/radiation/quality/duration/timing/severity/associated sxs/prior Treatment) HPI Patient presents to the emergency department with cough, shortness of breath, and hip pain.  The patient, states, that her hip pain, is chronic.  The patient, states, that she was seen recently for her shortness of breath.  The patient denies nausea, vomiting, diarrhea, blurred vision, weakness, dizziness, headache, neck pain, fever, or syncope.  The patient, states she is a current smoker.  Patient is on dialysis, as well.  Patient, states she did not take any medications prior to arrival other than her prescribed medications. Past Medical History  Diagnosis Date  . Hypertension   . HIV infection   . Chronic renal failure   . CHF (congestive heart failure)   . ESRD (end stage renal disease)    Past Surgical History  Procedure Laterality Date  . Arteriovenous graft placement     No family history on file. History  Substance Use Topics  . Smoking status: Current Every Day Smoker -- 0.50 packs/day  . Smokeless tobacco: Not on file  . Alcohol Use: Yes   OB History   Grav Para Term Preterm Abortions TAB SAB Ect Mult Living                 Review of Systems All other systems negative except as documented in the HPI. All pertinent positives and negatives as reviewed in the HPI. Allergies  Morphine and related  Home Medications   Current Outpatient Rx  Name  Route  Sig  Dispense  Refill  . amitriptyline (ELAVIL) 10 MG tablet   Oral   Take 20 mg by mouth at bedtime.         Marland Kitchen amLODipine (NORVASC) 10 MG tablet   Oral   Take 10 mg by mouth daily.         . carvedilol (COREG) 6.25 MG tablet   Oral   Take 6.25 mg by mouth 2 (two) times daily with a meal.         .  cinacalcet (SENSIPAR) 60 MG tablet   Oral   Take 60 mg by mouth daily.         Marland Kitchen lamiVUDine (EPIVIR) 150 MG tablet   Oral   Take 150 mg by mouth daily before breakfast.          . omeprazole (PRILOSEC) 20 MG capsule   Oral   Take 20 mg by mouth daily.         . raltegravir (ISENTRESS) 400 MG tablet   Oral   Take 400 mg by mouth 2 (two) times daily.          . sevelamer carbonate (RENVELA) 800 MG tablet   Oral   Take 1,600-2,400 mg by mouth 5 (five) times daily. 3 Tabs with meals and 2 tabs with snacks         . tenofovir (VIREAD) 300 MG tablet   Oral   Take 300 mg by mouth every Friday. Takes on Fridays.          BP 185/91  Pulse 89  Temp(Src) 98.9 F (37.2 C) (Oral)  Resp 21  SpO2 92% Physical Exam  Nursing note and vitals reviewed. Constitutional: She is oriented to person, place, and time. She appears well-developed  and well-nourished.  HENT:  Head: Normocephalic and atraumatic.  Mouth/Throat: Oropharynx is clear and moist.  Eyes: Pupils are equal, round, and reactive to light.  Neck: Normal range of motion. Neck supple.  Cardiovascular: Normal rate, regular rhythm and normal heart sounds.   Pulmonary/Chest: Effort normal. No respiratory distress. She has no wheezes. She has rales.  Musculoskeletal:       Right hip: She exhibits tenderness.  Neurological: She is alert and oriented to person, place, and time.  Skin: Skin is warm and dry. No erythema.    ED Course  Procedures (including critical care time) Labs Review Labs Reviewed  CBC WITH DIFFERENTIAL - Abnormal; Notable for the following:    RBC 2.64 (*)    Hemoglobin 9.2 (*)    HCT 27.6 (*)    MCV 104.5 (*)    MCH 34.8 (*)    All other components within normal limits  BASIC METABOLIC PANEL - Abnormal; Notable for the following:    Potassium 5.4 (*)    Chloride 92 (*)    Glucose, Bld 101 (*)    BUN 70 (*)    Creatinine, Ser 13.15 (*)    GFR calc non Af Amer 3 (*)    GFR calc Af Amer 3  (*)    All other components within normal limits  PRO B NATRIURETIC PEPTIDE - Abnormal; Notable for the following:    Pro B Natriuretic peptide (BNP) 31834.0 (*)    All other components within normal limits  POCT I-STAT TROPONIN I   Imaging Review Dg Chest Portable 1 View  10/06/2013   CLINICAL DATA:  Chest pain, shortness of breath, hypertension, HIV.  EXAM: PORTABLE CHEST - 1 VIEW  COMPARISON:  Chest radiograph September 29, 2013  FINDINGS: Cardiac silhouette remains upper limits of normal. Mildly calcified aortic knob. Similar central pulmonary vasculature congestion, mild interstitial prominence. Unchanged bandlike density in left midlung zone. No pleural effusions or focal consolidations. No pneumothorax.  Soft tissue planes and included osseous structures are nonsuspicious. Multiple EKG lines overlie the patient and may obscure subtle underlying pathology.  IMPRESSION: Stable appearance of chest: Borderline cardiomegaly with apparent mild pulmonary edema. Left midlung zone atelectasis versus scarring.   Electronically Signed   By: Awilda Metro   On: 10/06/2013 04:03    EKG Interpretation   None       Advised the patient of the plan and the expected course here in the emergency department.  The patient voiced an understanding.  Patient is advised the staff that she would like to leave AMA and is going to dialysis.  Patient became agitated and loud and yelling at the nurses.  Advised the patient that we need to finish her workup and she still wanted to leave   Carlyle Dolly, PA-C 10/06/13 0935

## 2013-10-06 NOTE — ED Notes (Addendum)
Per EMS, pt coming from home c/o of left rib pain which radiates to the left side of her chest starting 1830 yesterday with "slight" SOB. Pt reports vomiting yesterday. Pt also reports chronic right hip pain. Pt is a dialysis pt, last went to dialysis on Friday.  CBG:129

## 2013-10-21 ENCOUNTER — Emergency Department (HOSPITAL_COMMUNITY): Payer: Medicare Other

## 2013-10-21 ENCOUNTER — Encounter (HOSPITAL_COMMUNITY): Payer: Self-pay | Admitting: Emergency Medicine

## 2013-10-21 ENCOUNTER — Inpatient Hospital Stay (HOSPITAL_COMMUNITY)
Admission: EM | Admit: 2013-10-21 | Discharge: 2013-10-28 | DRG: 291 | Disposition: A | Discharge status: Skilled Nursing Facility | Payer: Medicare Other | Attending: Internal Medicine | Admitting: Internal Medicine

## 2013-10-21 DIAGNOSIS — R079 Chest pain, unspecified: Secondary | ICD-10-CM | POA: Diagnosis present

## 2013-10-21 DIAGNOSIS — N2581 Secondary hyperparathyroidism of renal origin: Secondary | ICD-10-CM | POA: Diagnosis present

## 2013-10-21 DIAGNOSIS — F339 Major depressive disorder, recurrent, unspecified: Secondary | ICD-10-CM | POA: Diagnosis present

## 2013-10-21 DIAGNOSIS — N186 End stage renal disease: Secondary | ICD-10-CM | POA: Diagnosis present

## 2013-10-21 DIAGNOSIS — Z79899 Other long term (current) drug therapy: Secondary | ICD-10-CM

## 2013-10-21 DIAGNOSIS — E875 Hyperkalemia: Secondary | ICD-10-CM | POA: Diagnosis present

## 2013-10-21 DIAGNOSIS — M899 Disorder of bone, unspecified: Secondary | ICD-10-CM | POA: Diagnosis present

## 2013-10-21 DIAGNOSIS — Z21 Asymptomatic human immunodeficiency virus [HIV] infection status: Secondary | ICD-10-CM | POA: Diagnosis present

## 2013-10-21 DIAGNOSIS — K5900 Constipation, unspecified: Secondary | ICD-10-CM | POA: Diagnosis present

## 2013-10-21 DIAGNOSIS — Z6841 Body Mass Index (BMI) 40.0 and over, adult: Secondary | ICD-10-CM

## 2013-10-21 DIAGNOSIS — G4700 Insomnia, unspecified: Secondary | ICD-10-CM | POA: Diagnosis present

## 2013-10-21 DIAGNOSIS — Z992 Dependence on renal dialysis: Secondary | ICD-10-CM

## 2013-10-21 DIAGNOSIS — Z91158 Patient's noncompliance with renal dialysis for other reason: Secondary | ICD-10-CM

## 2013-10-21 DIAGNOSIS — B20 Human immunodeficiency virus [HIV] disease: Secondary | ICD-10-CM

## 2013-10-21 DIAGNOSIS — R45851 Suicidal ideations: Secondary | ICD-10-CM

## 2013-10-21 DIAGNOSIS — N189 Chronic kidney disease, unspecified: Secondary | ICD-10-CM

## 2013-10-21 DIAGNOSIS — I10 Essential (primary) hypertension: Secondary | ICD-10-CM

## 2013-10-21 DIAGNOSIS — Z9115 Patient's noncompliance with renal dialysis: Secondary | ICD-10-CM

## 2013-10-21 DIAGNOSIS — I5033 Acute on chronic diastolic (congestive) heart failure: Principal | ICD-10-CM | POA: Diagnosis present

## 2013-10-21 DIAGNOSIS — I509 Heart failure, unspecified: Secondary | ICD-10-CM | POA: Diagnosis present

## 2013-10-21 DIAGNOSIS — F1410 Cocaine abuse, uncomplicated: Secondary | ICD-10-CM | POA: Diagnosis present

## 2013-10-21 DIAGNOSIS — D649 Anemia, unspecified: Secondary | ICD-10-CM | POA: Diagnosis present

## 2013-10-21 DIAGNOSIS — G8929 Other chronic pain: Secondary | ICD-10-CM | POA: Diagnosis present

## 2013-10-21 DIAGNOSIS — I120 Hypertensive chronic kidney disease with stage 5 chronic kidney disease or end stage renal disease: Secondary | ICD-10-CM | POA: Diagnosis present

## 2013-10-21 DIAGNOSIS — R0602 Shortness of breath: Secondary | ICD-10-CM | POA: Diagnosis present

## 2013-10-21 DIAGNOSIS — M949 Disorder of cartilage, unspecified: Secondary | ICD-10-CM

## 2013-10-21 DIAGNOSIS — R5381 Other malaise: Secondary | ICD-10-CM | POA: Diagnosis present

## 2013-10-21 DIAGNOSIS — F17200 Nicotine dependence, unspecified, uncomplicated: Secondary | ICD-10-CM | POA: Diagnosis present

## 2013-10-21 DIAGNOSIS — J810 Acute pulmonary edema: Secondary | ICD-10-CM

## 2013-10-21 HISTORY — DX: End stage renal disease: N18.6

## 2013-10-21 HISTORY — DX: Dependence on renal dialysis: Z99.2

## 2013-10-21 LAB — BASIC METABOLIC PANEL
BUN: 77 mg/dL — AB (ref 6–23)
CO2: 20 meq/L (ref 19–32)
CREATININE: 13.75 mg/dL — AB (ref 0.50–1.10)
Calcium: 9.6 mg/dL (ref 8.4–10.5)
Chloride: 89 mEq/L — ABNORMAL LOW (ref 96–112)
GFR calc Af Amer: 3 mL/min — ABNORMAL LOW (ref >90)
GFR, EST NON AFRICAN AMERICAN: 3 mL/min — AB (ref >90)
Glucose, Bld: 92 mg/dL (ref 70–99)
Potassium: 6.1 mEq/L — ABNORMAL HIGH (ref 3.7–5.3)
SODIUM: 133 meq/L — AB (ref 137–147)

## 2013-10-21 LAB — CBC WITH DIFFERENTIAL/PLATELET
Basophils Absolute: 0 10*3/uL (ref 0.0–0.1)
Basophils Relative: 0 % (ref 0–1)
Eosinophils Absolute: 0.1 10*3/uL (ref 0.0–0.7)
Eosinophils Relative: 1 % (ref 0–5)
HEMATOCRIT: 30.1 % — AB (ref 36.0–46.0)
Hemoglobin: 10 g/dL — ABNORMAL LOW (ref 12.0–15.0)
LYMPHS ABS: 2.3 10*3/uL (ref 0.7–4.0)
LYMPHS PCT: 32 % (ref 12–46)
MCH: 35 pg — AB (ref 26.0–34.0)
MCHC: 33.2 g/dL (ref 30.0–36.0)
MCV: 105.2 fL — ABNORMAL HIGH (ref 78.0–100.0)
MONO ABS: 0.5 10*3/uL (ref 0.1–1.0)
MONOS PCT: 7 % (ref 3–12)
Neutro Abs: 4.2 10*3/uL (ref 1.7–7.7)
Neutrophils Relative %: 59 % (ref 43–77)
PLATELETS: 250 10*3/uL (ref 150–400)
RBC: 2.86 MIL/uL — AB (ref 3.87–5.11)
RDW: 14 % (ref 11.5–15.5)
WBC: 7.1 10*3/uL (ref 4.0–10.5)

## 2013-10-21 LAB — PRO B NATRIURETIC PEPTIDE: Pro B Natriuretic peptide (BNP): 23932 pg/mL — ABNORMAL HIGH (ref 0–125)

## 2013-10-21 LAB — POCT I-STAT TROPONIN I: Troponin i, poc: 0.02 ng/mL (ref 0.00–0.08)

## 2013-10-21 MED ORDER — FENTANYL CITRATE 0.05 MG/ML IJ SOLN
75.0000 ug | Freq: Once | INTRAMUSCULAR | Status: AC
  Administered 2013-10-21: 75 ug via INTRAVENOUS
  Filled 2013-10-21: qty 2

## 2013-10-21 MED ORDER — SODIUM POLYSTYRENE SULFONATE 15 GM/60ML PO SUSP
15.0000 g | Freq: Once | ORAL | Status: AC
  Administered 2013-10-22: 15 g via ORAL
  Filled 2013-10-21: qty 60

## 2013-10-21 NOTE — ED Notes (Signed)
Patient arrives from Niece's home with complaint of Epigastric and Lower Sternal tightness. Multiple children home have been sick. Patient began feeling tightness associated with shortness of breath and coughing. EMS administered oxygen via [place] @ 2L and pain was mostly relieved. IV access attempted x2 without success by EMS. Unrelated: Patient wishes to speak with social work about acquiring housing in an assisted living facility due to not being able to get the care she needs at home.

## 2013-10-21 NOTE — ED Notes (Signed)
PA reminded that patient continues to request pain medication.

## 2013-10-21 NOTE — ED Provider Notes (Signed)
CSN: [id number]     Arrival date & time 10/21/13  2004 History   First MD Initiated Contact with Patient 10/21/13 2011     Chief Complaint  Patient presents with  . Shortness of Breath  . Chest Pain   (Consider location/radiation/quality/duration/timing/severity/associated sxs/prior Treatment) Patient is a 63 y.o. female presenting with shortness of breath and chest pain. The history is provided by the patient and medical records.  Shortness of Breath Associated symptoms: chest pain and cough   Chest Pain Associated symptoms: cough and shortness of breath    This is a 63 year old female past medical history significant for HIV, hypertension, chronic renal failure on hemodialysis, congestive heart failure, presenting to the ED for chest pain.  Pt states she went to her infectious disease appt at [facility] earlier today, while she was there she continuously coughing which continued until she arrived home.  Once home she began having some mid-sternal chest pain associated with some SOB.  Denies palpitations, radiation of pain into extremities or neck, diaphoresis, dizziness, or weakness.  Denies recent sick contacts, pt did receive a flu shot this year.  No fevers, body aches, sweats, chills, rhinorrhea, nasal congestion, or sore throat. Pts dialysis schedule is MWF, did miss her appt yesterday.  Had CD4 drawn at [facility] today, unsure what her last known count was.  On arrival pt is pain free, states she feels better after the O2 given by EMS.  VS stable on arrival.  Past Medical History  Diagnosis Date  . Hypertension   . HIV infection   . Chronic renal failure   . CHF (congestive heart failure)   . ESRD (end stage renal disease)    Past Surgical History  Procedure Laterality Date  . Arteriovenous graft placement     No family history on file. History  Substance Use Topics  . Smoking status: Current Every Day Smoker -- 0.50 packs/day  . Smokeless tobacco: Not on file  . Alcohol Use: Yes   OB  History   Grav Para Term Preterm Abortions TAB SAB Ect Mult Living                 Review of Systems  Respiratory: Positive for cough and shortness of breath.   Cardiovascular: Positive for chest pain.  All other systems reviewed and are negative.    Allergies  Morphine and related  Home Medications   Current Outpatient Rx  Name  Route  Sig  Dispense  Refill  . amitriptyline (ELAVIL) 10 MG tablet   Oral   Take 20 mg by mouth at bedtime.         Marland Kitchen amLODipine (NORVASC) 10 MG tablet   Oral   Take 10 mg by mouth daily.         . carvedilol (COREG) 6.25 MG tablet   Oral   Take 6.25 mg by mouth 2 (two) times daily with a meal.         . cinacalcet (SENSIPAR) 60 MG tablet   Oral   Take 60 mg by mouth daily.         Marland Kitchen lamiVUDine (EPIVIR) 150 MG tablet   Oral   Take 150 mg by mouth daily before breakfast.          . omeprazole (PRILOSEC) 20 MG capsule   Oral   Take 20 mg by mouth daily.         . raltegravir (ISENTRESS) 400 MG tablet   Oral   Take 400 mg  by mouth 2 (two) times daily.          . sevelamer carbonate (RENVELA) 800 MG tablet   Oral   Take 1,600-2,400 mg by mouth 5 (five) times daily. 3 Tabs with meals and 2 tabs with snacks         . tenofovir (VIREAD) 300 MG tablet   Oral   Take 300 mg by mouth every Friday. Takes on Fridays.          BP 162/83  Pulse 98  Temp(Src) 97.9 F (36.6 C) (Oral)  Resp 15  Ht 5\' 4"  (1.626 m)  Wt 260 lb (117.935 kg)  BMI 44.61 kg/m2  SpO2 100%  Physical Exam  Nursing note and vitals reviewed. Constitutional: She is oriented to person, place, and time. She appears well-developed and well-nourished. No distress.  HENT:  Head: Normocephalic and atraumatic.  Right Ear: Tympanic membrane and ear canal normal.  Left Ear: Tympanic membrane and ear canal normal.  Nose: Nose normal.  Mouth/Throat: Uvula is midline, oropharynx is clear and moist and mucous membranes are normal. No oropharyngeal exudate,  posterior oropharyngeal edema or posterior oropharyngeal erythema.  Eyes: Conjunctivae and EOM are normal. Pupils are equal, round, and reactive to light.  Neck: Normal range of motion. Neck supple.  Cardiovascular: Normal rate, regular rhythm and normal heart sounds.   Pulmonary/Chest: Effort normal. No respiratory distress. She has rhonchi. She has rales.  Normal work of breathing without accessory muscle use; Rhonchi and rales at bilateral bases  Abdominal: Soft. Bowel sounds are normal. There is no tenderness. There is no guarding.  Musculoskeletal: Normal range of motion.  Trace edema BLE; LUE with forearm AV graft, strong thrill present  Neurological: She is alert and oriented to person, place, and time.  Skin: Skin is warm and dry. She is not diaphoretic.  Psychiatric: She has a normal mood and affect.    ED Course  Procedures (including critical care time) Labs Review Labs Reviewed  CBC WITH DIFFERENTIAL - Abnormal; Notable for the following:    RBC 2.86 (*)    Hemoglobin 10.0 (*)    HCT 30.1 (*)    MCV 105.2 (*)    MCH 35.0 (*)    All other components within normal limits  BASIC METABOLIC PANEL - Abnormal; Notable for the following:    Sodium 133 (*)    Potassium 6.1 (*)    Chloride 89 (*)    BUN 77 (*)    Creatinine, Ser 13.75 (*)    GFR calc non Af Amer 3 (*)    GFR calc Af Amer 3 (*)    All other components within normal limits  PRO B NATRIURETIC PEPTIDE - Abnormal; Notable for the following:    Pro B Natriuretic peptide (BNP) 23932.0 (*)    All other components within normal limits  POCT I-STAT TROPONIN I   Imaging Review Dg Chest 2 View  10/21/2013   CLINICAL DATA:  Cough. Shortness of breath. chest pain. Current history of HIV, end-stage renal disease, and hypertension.  EXAM: CHEST  2 VIEW  COMPARISON:  Portable examinations 10/06/2013, 05/25/2013. Two-view chest x-ray 09/29/2013, 07/31/2013.  FINDINGS: Cardiac silhouette mildly enlarged but stable. Mild  diffuse interstitial pulmonary edema, best visualized on the lateral image. No confluent airspace consolidation. Linear atelectasis in the lower lobes and the inferior upper lobes bilaterally. No pleural effusions. Degenerative changes involving the thoracic spine.  IMPRESSION: Mild CHF, with mild cardiomegaly and mild diffuse interstitial pulmonary edema.  Electronically Signed   By: Hulan Saas M.D.   On: 10/21/2013 21:38    EKG Interpretation    Date/Time:  Tuesday October 21 2013 20:15:39 EST Ventricular Rate:  97 PR Interval:  199 QRS Duration: 109 QT Interval:  365 QTC Calculation: 464 R Axis:   63 Text Interpretation:  Sinus rhythm Minimal ST elevation, inferior leads No significant change since last tracing 09/29/13 Confirmed by Juleen China  MD, STEPHEN ([id number]) on 10/21/2013 8:32:33 PM            MDM   1. Acute exacerbation of CHF (congestive heart failure)   2. Pulmonary edema, acute   3. Chest pain   4. ESRD (end stage renal disease) on dialysis   5. Hyperkalemia   6. HIV (human immunodeficiency virus infection)   7. Hypertension    EKG NSR, no acute ischemic changes.  Trop negative.  CXR again with mild CHF with diffuse interstitial edema.  BNP elevated at [address].  K+ elevated at 6.1, dose of kayexalate given.  On re-evaluation pt found to be hypoxic at 88%, placed on 2L with improvement to 95%.  Pt has multiple medical problems at hand and ultimately needs dialysis however, she is often non-complaint with this as she missed her appt yesterday due to transportation issues.  Given this, will opt for hospital admission.  Spoke with hospitalist, Dr. Onalee Hua who will admit pt.  Consulted nephrology, Dr. Allena Katz-- will see in the morning for dialysis.  VS remain stable.  Garlon Hatchet, PA-C 10/22/13 [phone]

## 2013-10-21 NOTE — ED Notes (Signed)
Patient requesting pain medication. PA aware of need.

## 2013-10-21 NOTE — ED Notes (Signed)
Pain medication need continues. PA reminded again about patient request.

## 2013-10-22 ENCOUNTER — Encounter (HOSPITAL_COMMUNITY): Payer: Self-pay | Admitting: Nephrology

## 2013-10-22 DIAGNOSIS — F1410 Cocaine abuse, uncomplicated: Secondary | ICD-10-CM

## 2013-10-22 DIAGNOSIS — R0602 Shortness of breath: Secondary | ICD-10-CM

## 2013-10-22 DIAGNOSIS — E875 Hyperkalemia: Secondary | ICD-10-CM

## 2013-10-22 DIAGNOSIS — N186 End stage renal disease: Secondary | ICD-10-CM

## 2013-10-22 DIAGNOSIS — Z21 Asymptomatic human immunodeficiency virus [HIV] infection status: Secondary | ICD-10-CM

## 2013-10-22 DIAGNOSIS — F339 Major depressive disorder, recurrent, unspecified: Secondary | ICD-10-CM

## 2013-10-22 DIAGNOSIS — R079 Chest pain, unspecified: Secondary | ICD-10-CM | POA: Diagnosis present

## 2013-10-22 DIAGNOSIS — J810 Acute pulmonary edema: Secondary | ICD-10-CM

## 2013-10-22 LAB — BASIC METABOLIC PANEL
BUN: 82 mg/dL — AB (ref 6–23)
CALCIUM: 9.4 mg/dL (ref 8.4–10.5)
CO2: 21 mEq/L (ref 19–32)
Chloride: 90 mEq/L — ABNORMAL LOW (ref 96–112)
Creatinine, Ser: 14.57 mg/dL — ABNORMAL HIGH (ref 0.50–1.10)
GFR calc Af Amer: 3 mL/min — ABNORMAL LOW (ref >90)
GFR, EST NON AFRICAN AMERICAN: 2 mL/min — AB (ref >90)
Glucose, Bld: 137 mg/dL — ABNORMAL HIGH (ref 70–99)
Potassium: 5.8 mEq/L — ABNORMAL HIGH (ref 3.7–5.3)
SODIUM: 136 meq/L — AB (ref 137–147)

## 2013-10-22 LAB — CBC
HCT: 25.8 % — ABNORMAL LOW (ref 36.0–46.0)
Hemoglobin: 8.8 g/dL — ABNORMAL LOW (ref 12.0–15.0)
MCH: 35.1 pg — ABNORMAL HIGH (ref 26.0–34.0)
MCHC: 34.1 g/dL (ref 30.0–36.0)
MCV: 102.8 fL — ABNORMAL HIGH (ref 78.0–100.0)
Platelets: 230 10*3/uL (ref 150–400)
RBC: 2.51 MIL/uL — ABNORMAL LOW (ref 3.87–5.11)
RDW: 13.8 % (ref 11.5–15.5)
WBC: 6.5 10*3/uL (ref 4.0–10.5)

## 2013-10-22 LAB — TROPONIN I: Troponin I: <0.3 ng/mL (ref <0.30)

## 2013-10-22 MED ORDER — TENOFOVIR DISOPROXIL FUMARATE 300 MG PO TABS
300.0000 mg | ORAL_TABLET | ORAL | Status: DC
  Administered 2013-10-24: 300 mg via ORAL
  Filled 2013-10-22: qty 1

## 2013-10-22 MED ORDER — GUAIFENESIN 100 MG/5ML PO SYRP
200.0000 mg | ORAL_SOLUTION | ORAL | Status: DC | PRN
  Administered 2013-10-22 (×2): 200 mg via ORAL
  Filled 2013-10-22 (×3): qty 10

## 2013-10-22 MED ORDER — CARVEDILOL 6.25 MG PO TABS
6.2500 mg | ORAL_TABLET | Freq: Two times a day (BID) | ORAL | Status: DC
  Administered 2013-10-22 – 2013-10-27 (×9): 6.25 mg via ORAL
  Filled 2013-10-22 (×15): qty 1

## 2013-10-22 MED ORDER — DARBEPOETIN ALFA-POLYSORBATE 40 MCG/0.4ML IJ SOLN
40.0000 ug | INTRAMUSCULAR | Status: DC
  Administered 2013-10-22: 40 ug via INTRAVENOUS

## 2013-10-22 MED ORDER — SODIUM CHLORIDE 0.9 % IJ SOLN
3.0000 mL | Freq: Two times a day (BID) | INTRAMUSCULAR | Status: DC
  Administered 2013-10-22 – 2013-10-25 (×6): 3 mL via INTRAVENOUS
  Administered 2013-10-25: 22:00:00 via INTRAVENOUS
  Administered 2013-10-26 – 2013-10-27 (×3): 3 mL via INTRAVENOUS

## 2013-10-22 MED ORDER — AMITRIPTYLINE HCL 10 MG PO TABS
20.0000 mg | ORAL_TABLET | Freq: Every day | ORAL | Status: DC
  Filled 2013-10-22: qty 2

## 2013-10-22 MED ORDER — SODIUM CHLORIDE 0.9 % IV SOLN: 250.0000 mL | INTRAVENOUS | Status: DC | PRN

## 2013-10-22 MED ORDER — KETOROLAC TROMETHAMINE 30 MG/ML IJ SOLN
30.0000 mg | Freq: Once | INTRAMUSCULAR | Status: AC
  Administered 2013-10-22: 30 mg via INTRAVENOUS
  Filled 2013-10-22: qty 1

## 2013-10-22 MED ORDER — SODIUM CHLORIDE 0.9 % IJ SOLN: 3.0000 mL | INTRAMUSCULAR | Status: DC | PRN

## 2013-10-22 MED ORDER — LAMIVUDINE 150 MG PO TABS: 150.0000 mg | ORAL_TABLET | Freq: Every day | ORAL | Status: DC

## 2013-10-22 MED ORDER — TRAZODONE HCL 50 MG PO TABS
50.0000 mg | ORAL_TABLET | Freq: Every day | ORAL | Status: DC
  Administered 2013-10-22: 50 mg via ORAL
  Filled 2013-10-22 (×7): qty 1

## 2013-10-22 MED ORDER — AMLODIPINE BESYLATE 10 MG PO TABS
10.0000 mg | ORAL_TABLET | Freq: Every day | ORAL | Status: DC
  Administered 2013-10-22 – 2013-10-26 (×5): 10 mg via ORAL
  Filled 2013-10-22 (×7): qty 1

## 2013-10-22 MED ORDER — SEVELAMER CARBONATE 800 MG PO TABS
1600.0000 mg | ORAL_TABLET | ORAL | Status: DC | PRN
  Filled 2013-10-22: qty 2

## 2013-10-22 MED ORDER — SEVELAMER CARBONATE 800 MG PO TABS
2400.0000 mg | ORAL_TABLET | Freq: Three times a day (TID) | ORAL | Status: DC
  Administered 2013-10-22 – 2013-10-28 (×15): 2400 mg via ORAL
  Filled 2013-10-22 (×22): qty 3

## 2013-10-22 MED ORDER — FLUOXETINE HCL 10 MG PO CAPS
10.0000 mg | ORAL_CAPSULE | Freq: Every day | ORAL | Status: DC
  Administered 2013-10-22 – 2013-10-27 (×6): 10 mg via ORAL
  Filled 2013-10-22 (×6): qty 1

## 2013-10-22 MED ORDER — DARBEPOETIN ALFA-POLYSORBATE 40 MCG/0.4ML IJ SOLN
INTRAMUSCULAR | Status: AC
  Filled 2013-10-22: qty 0.4

## 2013-10-22 MED ORDER — ACETAMINOPHEN 325 MG PO TABS
650.0000 mg | ORAL_TABLET | Freq: Four times a day (QID) | ORAL | Status: DC | PRN
  Administered 2013-10-22 – 2013-10-24 (×3): 650 mg via ORAL
  Filled 2013-10-22 (×3): qty 2

## 2013-10-22 MED ORDER — BENZONATATE 100 MG PO CAPS
100.0000 mg | ORAL_CAPSULE | Freq: Once | ORAL | Status: AC
  Administered 2013-10-22: 100 mg via ORAL
  Filled 2013-10-22: qty 1

## 2013-10-22 MED ORDER — LAMIVUDINE 10 MG/ML PO SOLN
50.0000 mg | Freq: Every day | ORAL | Status: DC
  Administered 2013-10-22 – 2013-10-28 (×7): 50 mg via ORAL
  Filled 2013-10-22 (×7): qty 5

## 2013-10-22 MED ORDER — RALTEGRAVIR POTASSIUM 400 MG PO TABS
400.0000 mg | ORAL_TABLET | Freq: Two times a day (BID) | ORAL | Status: DC
  Administered 2013-10-22 – 2013-10-28 (×13): 400 mg via ORAL
  Filled 2013-10-22 (×15): qty 1

## 2013-10-22 NOTE — Consult Note (Signed)
Reason for Consult: Depression and suicidal ideation Referring Physician: Geradine Girt, DO  [name] is an 63 y.o. female.  HPI: Patient was seen and chart reviewed. Patient was admitted to the [facility] Renal unit for end stage renal disease need for hemodialysis. Reportedly patient has been noncompliant with her hemodialysis and has increased symptoms of her depression, psychomotor retardation, loss of interest and suicidal ideation over 2 months. [name] is a 63 y.o. female has history of ESRD on hemodialysis, HIV, chronic anemia, hypertension and shortness of breath. Patient reported she was living with her niece and her and on and is not good because of drugs and also she feels nobody cares for her. Patient has suicidal thoughts about 2 weeks ago and today she wanted help but denies current suicide ideation, intention or plan. Patient has no evidence of psychotic symptoms. Patient endorses using cocaine in the past.  Mental Status Examination: Patient appeared as per his stated age, poorly groomed, and has fair eye contact. Patient has depressed and anxious mood and her affect was dysphoric. He has normal rate, rhythm, and volume of speech. Her thought process is linear and goal directed. Patient has denied suicidal, homicidal ideations, intentions or plans. Patient has no evidence of auditory or visual hallucinations, delusions, and paranoia. Patient has fair insight judgment and impulse control.  Past Medical History  Diagnosis Date  . Hypertension   . HIV infection   . Chronic renal failure   . CHF (congestive heart failure)   . ESRD (end stage renal disease) on dialysis 09/30/2013    On dialysis at [facility], per [facility] physicians.  Started dialysis in 2008.  Takes HD MWF schedule.      Past Surgical History  Procedure Laterality Date  . Arteriovenous graft placement      History reviewed. No pertinent family history.  Social History:  reports that she has been  smoking.  She does not have any smokeless tobacco history on file. She reports that she drinks alcohol. She reports that she uses illicit drugs (Marijuana) about once per week.  Allergies:  Allergies  Allergen Reactions  . Morphine And Related Hives and Rash    Medications: I have reviewed the patient's current medications.  Results for orders placed during the hospital encounter of 10/21/13 (from the past 48 hour(s))  CBC WITH DIFFERENTIAL     Status: Abnormal   Collection Time    10/21/13  8:37 PM      Result Value Range   WBC 7.1  4.0 - 10.5 K/uL   RBC 2.86 (*) 3.87 - 5.11 MIL/uL   Hemoglobin 10.0 (*) 12.0 - 15.0 g/dL   HCT 30.1 (*) 36.0 - 46.0 %   MCV 105.2 (*) 78.0 - 100.0 fL   MCH 35.0 (*) 26.0 - 34.0 pg   MCHC 33.2  30.0 - 36.0 g/dL   RDW 14.0  11.5 - 15.5 %   Platelets 250  150 - 400 K/uL   Neutrophils Relative % 59  43 - 77 %   Neutro Abs 4.2  1.7 - 7.7 K/uL   Lymphocytes Relative 32  12 - 46 %   Lymphs Abs 2.3  0.7 - 4.0 K/uL   Monocytes Relative 7  3 - 12 %   Monocytes Absolute 0.5  0.1 - 1.0 K/uL   Eosinophils Relative 1  0 - 5 %   Eosinophils Absolute 0.1  0.0 - 0.7 K/uL   Basophils Relative 0  0 -  1 %   Basophils Absolute 0.0  0.0 - 0.1 K/uL  BASIC METABOLIC PANEL     Status: Abnormal   Collection Time    10/21/13  8:37 PM      Result Value Range   Sodium 133 (*) 137 - 147 mEq/L   Potassium 6.1 (*) 3.7 - 5.3 mEq/L   Chloride 89 (*) 96 - 112 mEq/L   CO2 20  19 - 32 mEq/L   Glucose, Bld 92  70 - 99 mg/dL   BUN 77 (*) 6 - 23 mg/dL   Creatinine, Ser 13.75 (*) 0.50 - 1.10 mg/dL   Calcium 9.6  8.4 - 10.5 mg/dL   GFR calc non Af Amer 3 (*) >90 mL/min   GFR calc Af Amer 3 (*) >90 mL/min   Comment: (NOTE)     The eGFR has been calculated using the CKD EPI equation.     This calculation has not been validated in all clinical situations.     eGFR's persistently <90 mL/min signify possible Chronic Kidney     Disease.  PRO B NATRIURETIC PEPTIDE     Status:  Abnormal   Collection Time    10/21/13  8:37 PM      Result Value Range   Pro B Natriuretic peptide (BNP) 23932.0 (*) 0 - 125 pg/mL  POCT I-STAT TROPONIN I     Status: None   Collection Time    10/21/13  8:48 PM      Result Value Range   Troponin i, poc 0.02  0.00 - 0.08 ng/mL   Comment 3            Comment: Due to the release kinetics of cTnI,     a negative result within the first hours     of the onset of symptoms does not rule out     myocardial infarction with certainty.     If myocardial infarction is still suspected,     repeat the test at appropriate intervals.  TROPONIN I     Status: None   Collection Time    10/22/13  5:03 AM      Result Value Range   Troponin I <0.30  <0.30 ng/mL   Comment:            Due to the release kinetics of cTnI,     a negative result within the first hours     of the onset of symptoms does not rule out     myocardial infarction with certainty.     If myocardial infarction is still suspected,     repeat the test at appropriate intervals.  BASIC METABOLIC PANEL     Status: Abnormal   Collection Time    10/22/13  5:30 AM      Result Value Range   Sodium 136 (*) 137 - 147 mEq/L   Potassium 5.8 (*) 3.7 - 5.3 mEq/L   Chloride 90 (*) 96 - 112 mEq/L   CO2 21  19 - 32 mEq/L   Glucose, Bld 137 (*) 70 - 99 mg/dL   BUN 82 (*) 6 - 23 mg/dL   Creatinine, Ser 14.57 (*) 0.50 - 1.10 mg/dL   Calcium 9.4  8.4 - 10.5 mg/dL   GFR calc non Af Amer 2 (*) >90 mL/min   GFR calc Af Amer 3 (*) >90 mL/min   Comment: (NOTE)     The eGFR has been calculated using the CKD EPI equation.  This calculation has not been validated in all clinical situations.     eGFR's persistently <90 mL/min signify possible Chronic Kidney     Disease.  CBC     Status: Abnormal   Collection Time    10/22/13  5:30 AM      Result Value Range   WBC 6.5  4.0 - 10.5 K/uL   RBC 2.51 (*) 3.87 - 5.11 MIL/uL   Hemoglobin 8.8 (*) 12.0 - 15.0 g/dL   HCT 25.8 (*) 36.0 - 46.0 %   MCV  102.8 (*) 78.0 - 100.0 fL   MCH 35.1 (*) 26.0 - 34.0 pg   MCHC 34.1  30.0 - 36.0 g/dL   RDW 13.8  11.5 - 15.5 %   Platelets 230  150 - 400 K/uL    Dg Chest 2 View  10/21/2013   CLINICAL DATA:  Cough. Shortness of breath. chest pain. Current history of HIV, end-stage renal disease, and hypertension.  EXAM: CHEST  2 VIEW  COMPARISON:  Portable examinations 10/06/2013, 05/25/2013. Two-view chest x-ray 09/29/2013, 07/31/2013.  FINDINGS: Cardiac silhouette mildly enlarged but stable. Mild diffuse interstitial pulmonary edema, best visualized on the lateral image. No confluent airspace consolidation. Linear atelectasis in the lower lobes and the inferior upper lobes bilaterally. No pleural effusions. Degenerative changes involving the thoracic spine.  IMPRESSION: Mild CHF, with mild cardiomegaly and mild diffuse interstitial pulmonary edema.   Electronically Signed   By: Evangeline Dakin M.D.   On: 10/21/2013 21:38    Positive for anxiety, bad mood, behavior problems, illegal drug usage and sleep disturbance Blood pressure 160/106, pulse 107, temperature 98.3 F (36.8 C), temperature source Oral, resp. rate 24, height _0  (1.626 m), weight 114.2 kg (251 lb 12.3 oz), SpO2 100.00%.   Assessment/Plan: Major depressive disorder recurrent Cocaine abuse End-stage renal disease  Recommendation: We start fluoxetine 20 mg daily for depression We start trazodone 50 mg at bedtime for insomnia  Discontinue amitriptyline which is not helpful Recommend social service evaluation regarding out-of-home placement Appreciate psychiatric consultation May call [phone] if needed further assistance.  [name],JANARDHAHA R. 10/22/2013, 1:49 PM

## 2013-10-22 NOTE — Progress Notes (Signed)
Chaplain [place] a [place] from [place]. Chaplain offered ministry of presence, emotional and spiritual support to patient. Patient said "she was in a lot of pain and unable to talk right now." Patient will follow up as needed or requested.   10/22/13 1400  Clinical Encounter Type  Visited With Patient  Visit Type Initial  Referral From Chaplain

## 2013-10-22 NOTE — Procedures (Signed)
I was present at this dialysis session, have reviewed the session itself and made  appropriate changes  Vinson Moselleob Edenilson Austad MD (pgr) [phone]    [phone] 10/22/2013, 11:47 AM

## 2013-10-22 NOTE — Progress Notes (Signed)
Patient admitted by Dr. Onalee Huaavid- please see H&P. -called psych for suicidal ideations with plan- patient denies currently but seems very flat and depressed- poor eye contact  Marlin CanaryJessica Vann DO [phone]

## 2013-10-22 NOTE — Progress Notes (Signed)
Chaplain paged to pt bedside.  Pt states that she is depressed and has considered ending her life.  Through an extended conversation, pt expresses many stress factors including her previous drug use; medical care needs; lack of care at home; and difficulty with ambulation.  Pt has a stated desire to make healthy choices and to refrain from illicit drug use in the future and states that she has been drug free for 2+ months.  Pt stated that her son, Chanetta MarshallJimmy, is aware of her location and may come to check on her when he is off work.  Pt ate some crackers & milk in preparation for her dialysis treatment today.  At end of encounter, pt stated that she no longer felt that ending her life was a good solution and that her breathing was getting easier.  She said that she was sleepy and wanted to rest.  Chaplain offered a prayer and emotional support as well as ministry of presence for a pt who wanted to talk through multiple stress factors.   Referral will be made to unit chaplain for follow up later in the day.   10/22/13 0700  Clinical Encounter Type  Visited With Patient;Health care provider  Visit Type Initial;Psychological support;Spiritual support  Referral From Nurse  Spiritual Encounters  Spiritual Needs Prayer;Emotional  Stress Factors  Patient Stress Factors Family relationships;Financial concerns;Lack of caregivers;Health changes   [place] Kyliee Ortego, [address] Hospital [address]haplain

## 2013-10-22 NOTE — H&P (Signed)
Chief Complaint:  sob  HPI: 63 yo female esrd dialysis on mwf skipped her dialysis session on Monday, comes in with sob for last day.  No fevers.  Some cough and peripheral general swelling.  Had some sscp earlier with the sob also.  Still makes some urine.  Is due for dialysis in am.  Review of Systems:  Positive and negative as per HPI otherwise all other systems are negative  Past Medical History: Past Medical History  Diagnosis Date  . Hypertension   . HIV infection   . Chronic renal failure   . CHF (congestive heart failure)   . ESRD (end stage renal disease)    Past Surgical History  Procedure Laterality Date  . Arteriovenous graft placement      Medications: Prior to Admission medications   Medication Sig Start Date End Date Taking? Authorizing Provider  amitriptyline (ELAVIL) 10 MG tablet Take 20 mg by mouth at bedtime.   Yes Historical Provider, MD  amLODipine (NORVASC) 10 MG tablet Take 10 mg by mouth daily.   Yes Historical Provider, MD  carvedilol (COREG) 6.25 MG tablet Take 6.25 mg by mouth 2 (two) times daily with a meal.   Yes Historical Provider, MD  cinacalcet (SENSIPAR) 60 MG tablet Take 60 mg by mouth daily.   Yes Historical Provider, MD  lamiVUDine (EPIVIR) 150 MG tablet Take 150 mg by mouth daily before breakfast.    Yes Historical Provider, MD  omeprazole (PRILOSEC) 20 MG capsule Take 20 mg by mouth daily.   Yes Historical Provider, MD  raltegravir (ISENTRESS) 400 MG tablet Take 400 mg by mouth 2 (two) times daily.    Yes Historical Provider, MD  sevelamer carbonate (RENVELA) 800 MG tablet Take 1,600-2,400 mg by mouth 5 (five) times daily. 3 Tabs with meals and 2 tabs with snacks   Yes Historical Provider, MD  tenofovir (VIREAD) 300 MG tablet Take 300 mg by mouth every Friday. Takes on Fridays.   Yes Historical Provider, MD    Allergies:   Allergies  Allergen Reactions  . Morphine And Related Hives and Rash    Social History:  reports that she has  been smoking.  She does not have any smokeless tobacco history on file. She reports that she drinks alcohol. She reports that she uses illicit drugs (Marijuana) about once per week.  Family History: none  Physical Exam: Filed Vitals:   10/21/13 2300 10/21/13 2315 10/21/13 2330 10/21/13 2345  BP: 156/82 146/85 152/78 141/75  Pulse: 97 103 99 102  Temp:      TempSrc:      Resp:      Height:      Weight:      SpO2: 96% 97% 100% 98%   General appearance: alert, cooperative and no distress Head: Normocephalic, without obvious abnormality, atraumatic Eyes: negative Nose: Nares normal. Septum midline. Mucosa normal. No drainage or sinus tenderness. Neck: no JVD and supple, symmetrical, trachea midline Lungs: diminished breath sounds bilaterally Heart: regular rate and rhythm, S1, S2 normal, no murmur, click, rub or gallop Abdomen: soft, non-tender; bowel sounds normal; no masses,  no organomegaly Extremities: edema trace Pulses: 2+ and symmetric Skin: Skin color, texture, turgor normal. No rashes or lesions Neurologic: Grossly normal    Labs on Admission:   Recent Labs  10/21/13 2037  NA 133*  K 6.1*  CL 89*  CO2 20  GLUCOSE 92  BUN 77*  CREATININE 13.75*  CALCIUM 9.6    Recent Labs  10/21/13  2037  WBC 7.1  NEUTROABS 4.2  HGB 10.0*  HCT 30.1*  MCV 105.2*  PLT 250    Radiological Exams on Admission: Dg Chest 2 View  10/21/2013   CLINICAL DATA:  Cough. Shortness of breath. chest pain. Current history of HIV, end-stage renal disease, and hypertension.  EXAM: CHEST  2 VIEW  COMPARISON:  Portable examinations 10/06/2013, 05/25/2013. Two-view chest x-ray 09/29/2013, 07/31/2013.  FINDINGS: Cardiac silhouette mildly enlarged but stable. Mild diffuse interstitial pulmonary edema, best visualized on the lateral image. No confluent airspace consolidation. Linear atelectasis in the lower lobes and the inferior upper lobes bilaterally. No pleural effusions. Degenerative  changes involving the thoracic spine.  IMPRESSION: Mild CHF, with mild cardiomegaly and mild diffuse interstitial pulmonary edema.   Electronically Signed   By: Hulan Saas M.D.   On: 10/21/2013 21:38   Assessment/Plan  63 yo female with sob, pulm edema secondary to noncompliance with dialysis  Principal Problem:   Pulmonary edema, acute-  Dialysis in am.  Obs.    Active Problems:    HIV infection stable   Hypertension stable   Hyperkalemia  Given kayexelate, repeat in am   ESRD (end stage renal disease) on dialysis   Chest pain serial enzymes    Starlett Pehrson A 10/22/2013, 12:55 AM

## 2013-10-22 NOTE — Progress Notes (Signed)
10/22/13 06:27 Patient expressed suicidal thoughts,"I was planning on taking all my amytriptiline and just go to sleep". "I also tried cocaine". Patient tearful and expressed hopelessness.MD,Charge nurse,Administrative Coordinator and Chaplain notified.Will continue to monitor patient. Jackob [place]okston Joselita,RN

## 2013-10-22 NOTE — Progress Notes (Signed)
UR completed. Patient changed to inpatient r/t pulm edema requiring dialysis.

## 2013-10-22 NOTE — Consult Note (Signed)
[facility] Renal Consultation Note    Indication for Consultation:  Management of ESRD/hemodialysis; anemia, hypertension/volume and secondary hyperparathyroidism  HPI: [name] is a 63 y.o. female with ESRD, HTN, HIV,substance (crack) abuse [place] presented with SOB and history of missing Monday dialysis.  She was put on dialysis earlier this am. Labs in the ED showed hyperkalemia with K 6.1 Hgb 10 down to 8.8 on recheck. - last outpt Hgb was 8.6. She has also received [facility]. At present she denies pain, fever, chills,N, V, D or constipation. She makes a little urine without dysuria. She's hade some cough; Presently feels drowsy. Denies SOB at present --she has taken her O2 off.  Was seen here late December with a similar presentation..  Past Medical History  Diagnosis Date  . Hypertension   . HIV infection   . Chronic renal failure   . CHF (congestive heart failure)   . ESRD (end stage renal disease)    Past Surgical History  Procedure Laterality Date  . Arteriovenous graft placement     No family history on file. Per [place] records: hx CVA in mother, father and sister Social History: Long history of cocaine abuse and unstable living situations  reports that she has been smoking.  She does not have any smokeless tobacco history on file. She reports that she drinks alcohol. She reports that she uses illicit drugs (Marijuana) about once per week. Allergies  Allergen Reactions  . Morphine And Related Hives and Rash   Prior to Admission medications   Medication Sig Start Date End Date Taking? Authorizing Provider  amitriptyline (ELAVIL) 10 MG tablet Take 20 mg by mouth at bedtime.   Yes Historical Provider, MD  amLODipine (NORVASC) 10 MG tablet Take 10 mg by mouth daily.   Yes Historical Provider, MD  carvedilol (COREG) 6.25 MG tablet Take 6.25 mg by mouth 2 (two) times daily with a meal.   Yes Historical Provider, MD  cinacalcet  (SENSIPAR) 60 MG tablet Take 60 mg by mouth daily.   Yes Historical Provider, MD  lamiVUDine (EPIVIR) 150 MG tablet Take 150 mg by mouth daily before breakfast.    Yes Historical Provider, MD  omeprazole (PRILOSEC) 20 MG capsule Take 20 mg by mouth daily.   Yes Historical Provider, MD  raltegravir (ISENTRESS) 400 MG tablet Take 400 mg by mouth 2 (two) times daily.    Yes Historical Provider, MD  sevelamer carbonate (RENVELA) 800 MG tablet Take 1,600-2,400 mg by mouth 5 (five) times daily. 3 Tabs with meals and 2 tabs with snacks   Yes Historical Provider, MD  tenofovir (VIREAD) 300 MG tablet Take 300 mg by mouth every Friday. Takes on Fridays.   Yes Historical Provider, MD   Current Facility-Administered Medications  Medication Dose Route Frequency Provider Last Rate Last Dose  . 0.9 %  sodium chloride infusion  250 mL Intravenous PRN Haydee Monicaachal A David, MD      . amitriptyline (ELAVIL) tablet 20 mg  20 mg Oral QHS Haydee Monicaachal A David, MD      . amLODipine (NORVASC) tablet 10 mg  10 mg Oral Daily Rachal Cliffton AstersA David, MD      . carvedilol (COREG) tablet 6.25 mg  6.25 mg Oral BID WC Rachal Cliffton AstersA David, MD      . lamiVUDine (EPIVIR) 10 MG/ML solution 50 mg  50 mg Oral Daily Haydee Monicaachal A David, MD      . raltegravir (ISENTRESS) tablet 400  mg  400 mg Oral BID Haydee Monica, MD      . sevelamer carbonate (RENVELA) tablet 1,600 mg  1,600 mg Oral PRN Haydee Monica, MD      . sevelamer carbonate (RENVELA) tablet 2,400 mg  2,400 mg Oral TID WC Rachal A Onalee Hua, MD      . sodium chloride 0.9 % injection 3 mL  3 mL Intravenous Q12H Haydee Monica, MD      . sodium chloride 0.9 % injection 3 mL  3 mL Intravenous PRN Haydee Monica, MD      . Melene Muller ON 10/24/2013] tenofovir (VIREAD) tablet 300 mg  300 mg Oral Q Fri Haydee Monica, MD       Labs: Basic Metabolic Panel:  Recent Labs Lab 10/21/13 2037 10/22/13 0530  NA 133* 136*  K 6.1* 5.8*  CL 89* 90*  CO2 20 21  GLUCOSE 92 137*  BUN 77* 82*  CREATININE 13.75* 14.57*   CALCIUM 9.6 9.4  CBC:  Recent Labs Lab 10/21/13 2037 10/22/13 0530  WBC 7.1 6.5  NEUTROABS 4.2  --   HGB 10.0* 8.8*  HCT 30.1* 25.8*  MCV 105.2* 102.8*  PLT 250 230  Studies/Results: Dg Chest 2 View  10/21/2013   CLINICAL DATA:  Cough. Shortness of breath. chest pain. Current history of HIV, end-stage renal disease, and hypertension.  EXAM: CHEST  2 VIEW  COMPARISON:  Portable examinations 10/06/2013, 05/25/2013. Two-view chest x-ray 09/29/2013, 07/31/2013.  FINDINGS: Cardiac silhouette mildly enlarged but stable. Mild diffuse interstitial pulmonary edema, best visualized on the lateral image. No confluent airspace consolidation. Linear atelectasis in the lower lobes and the inferior upper lobes bilaterally. No pleural effusions. Degenerative changes involving the thoracic spine.  IMPRESSION: Mild CHF, with mild cardiomegaly and mild diffuse interstitial pulmonary edema.   Electronically Signed   By: Hulan Saas M.D.   On: 10/21/2013 21:38   ROS: As per HPI - limited but pt's sleepiness versus not wanting to engage in conversation  Physical Exam: Goal set 2.5 - crit line suggests need for more fluid removal; Goal increased as per HD orders to 4.5 due to only half of treatment remaining Pre weight 118 - EDW is 112 Filed Vitals:   10/22/13 0830 10/22/13 0900 10/22/13 0930 10/22/13 1000  BP: 190/102 175/100 168/95 154/89  Pulse: 101 96 102 97  Temp:      TempSrc:      Resp:      Height:      Weight:      SpO2:         General: Well developed, well nourished, in no acute distress.supine on HD on room air Head: Eyes closed; Normocephalic, atraumatic, sclera non-icteric, mucus membranes are moist Neck: Supple. JVD not elevated.  Lungs: Grossly clear anteriorly with dim BS; breathing is unlabored. Heart: RRR with S1 S2. No murmurs, rubs,  appreciated. Abdomen:  Obese Soft, non-tender, large mid line scar non-distended with normoactive bowel sounds. No rebound/guarding.  Lower  extremities: + edema or ischemic changes, no open wounds  Neuro/psych: Hard to tell if just sleepy or disengaged Dialysis Access: left AVF Qb 450  Dialysis Orders: Center: [facility] MWF 4 hr 180 450/800 EDW 112 2 K 2.5 Ca left AVF Epo 2800 (just increased from 2200 [place] 1000 no hectorol  Recent labs:  Hgb 8.6 1/7 tsat 20% 12/17 ferritin 776 11/12iPTH 338 11/22 - not on vitamin D - possibly because ^ Ca but noted on 2.5 Ca  bath- previously on hectorol 4 8/22   Assessment/Plan: 1. SOB - due to fluid overlaod; CXR shows edema; 6 kg above EDW;  ^ goal on HD 2. ESRD -  Treatment sheet from 1/07 showed she actually got below EDW but BP VERY HIGH 3. Hypertension/volume  - "BP very high at outpt dialysis - not sure if related to needing med, needing EDW lowered or other - but it is obvious she could probably stand to have EDW lowered; on a very low dose coreg given degree on HTN 4. Anemia  - Hgb low, but stable, last IV Fe given 1/09 - looks like he finished a course and Epo just increased to 2800 - will give 40 of Aranesp with HD today 5. Metabolic bone disease -  Not on hectorol at present - possibly due to prior ^ Ca - defer to her outpt HD center; on renvela and sensipar 6. Nutrition - renal diet + multivit 7. Depresion/suicidal ideation - psych consult recommended; need more meaningful conversation when awake after dialysis; multiple medical issues, substance abuse and unstable living situations contributory 8. Substance abuse -  9. HIV 10. Medical/dialysis noncompliance  Sheffield Slider, PA-C [facility] Beeper [phone] 10/22/2013, 10:13 AM   I have seen and examined patient, discussed with PA and agree with assessment and plan as outlined above.  Pleasant AAF with ESRD, HTN , substance abuse presented to ED with SOB. Also expressed recent suicidal thoughts to staff in ED.  Having lots of problems at home. CXR showed pulm edema, not in distress, BP high and 6kg over dry wt.  Plan HD today and again tomorrow, lower volume. Will follow.  Vinson Moselle MD pager [phone]    cell [phone] 10/22/2013, 11:45 AM

## 2013-10-22 NOTE — Progress Notes (Signed)
Patient refused to allow Flu PCR swab to be fully inserted.  [name], MBA, BS, RN

## 2013-10-22 NOTE — Progress Notes (Signed)
Received patient from ED,admitted due to SOB,cough,chest pain,missed 3 HD treatmentsdue to not feeling well.Patient said she had fever at home,but don't know what temperature she had.Patient alert and oriented x4.Patient placed on telemetry box #12,CCMD notified.Placed on droplet precaution,patient made aware of isolation.Call bell within patient's reach. Catarino Vold Joselita,RN

## 2013-10-23 DIAGNOSIS — I509 Heart failure, unspecified: Secondary | ICD-10-CM

## 2013-10-23 DIAGNOSIS — E668 Other obesity: Secondary | ICD-10-CM | POA: Insufficient documentation

## 2013-10-23 LAB — RENAL FUNCTION PANEL
Albumin: 3.4 g/dL — ABNORMAL LOW (ref 3.5–5.2)
BUN: 46 mg/dL — ABNORMAL HIGH (ref 6–23)
CO2: 27 mEq/L (ref 19–32)
Calcium: 8.8 mg/dL (ref 8.4–10.5)
Chloride: 94 mEq/L — ABNORMAL LOW (ref 96–112)
Creatinine, Ser: 10.56 mg/dL — ABNORMAL HIGH (ref 0.50–1.10)
GFR calc Af Amer: 4 mL/min — ABNORMAL LOW (ref >90)
GFR calc non Af Amer: 3 mL/min — ABNORMAL LOW (ref >90)
Glucose, Bld: 134 mg/dL — ABNORMAL HIGH (ref 70–99)
Phosphorus: 6.2 mg/dL — ABNORMAL HIGH (ref 2.3–4.6)
Potassium: 4.9 mEq/L (ref 3.7–5.3)
Sodium: 140 mEq/L (ref 137–147)

## 2013-10-23 LAB — CBC
HCT: 26.7 % — ABNORMAL LOW (ref 36.0–46.0)
Hemoglobin: 8.5 g/dL — ABNORMAL LOW (ref 12.0–15.0)
MCH: 34.7 pg — ABNORMAL HIGH (ref 26.0–34.0)
MCHC: 31.8 g/dL (ref 30.0–36.0)
MCV: 109 fL — ABNORMAL HIGH (ref 78.0–100.0)
Platelets: 189 10*3/uL (ref 150–400)
RBC: 2.45 MIL/uL — ABNORMAL LOW (ref 3.87–5.11)
RDW: 13.8 % (ref 11.5–15.5)
WBC: 4.6 10*3/uL (ref 4.0–10.5)

## 2013-10-23 MED ORDER — LIDOCAINE-PRILOCAINE 2.5-2.5 % EX CREA: 1.0000 "application " | TOPICAL_CREAM | CUTANEOUS | Status: DC | PRN

## 2013-10-23 MED ORDER — FAMOTIDINE 20 MG PO TABS
20.0000 mg | ORAL_TABLET | Freq: Two times a day (BID) | ORAL | Status: DC | PRN
  Filled 2013-10-23 (×2): qty 1

## 2013-10-23 MED ORDER — ALTEPLASE 2 MG IJ SOLR: 2.0000 mg | Freq: Once | INTRAMUSCULAR | Status: DC | PRN

## 2013-10-23 MED ORDER — ALTEPLASE 2 MG IJ SOLR
2.0000 mg | Freq: Once | INTRAMUSCULAR | Status: DC | PRN
Start: 2013-10-23 — End: 2013-10-23
  Filled 2013-10-23: qty 2

## 2013-10-23 MED ORDER — HEPARIN SODIUM (PORCINE) 1000 UNIT/ML DIALYSIS
1000.0000 [IU] | INTRAMUSCULAR | Status: DC | PRN
  Filled 2013-10-23: qty 1

## 2013-10-23 MED ORDER — SODIUM CHLORIDE 0.9 % IV SOLN: 100.0000 mL | INTRAVENOUS | Status: DC | PRN

## 2013-10-23 MED ORDER — HEPARIN SODIUM (PORCINE) 1000 UNIT/ML DIALYSIS: 1000.0000 [IU] | INTRAMUSCULAR | Status: DC | PRN

## 2013-10-23 MED ORDER — HEPARIN SODIUM (PORCINE) 1000 UNIT/ML DIALYSIS: 20.0000 [IU]/kg | INTRAMUSCULAR | Status: DC | PRN

## 2013-10-23 MED ORDER — PENTAFLUOROPROP-TETRAFLUOROETH EX AERO: 1.0000 "application " | INHALATION_SPRAY | CUTANEOUS | Status: DC | PRN

## 2013-10-23 MED ORDER — LIDOCAINE HCL (PF) 1 % IJ SOLN: 5.0000 mL | INTRAMUSCULAR | Status: DC | PRN

## 2013-10-23 MED ORDER — NEPRO/CARBSTEADY PO LIQD: 237.0000 mL | ORAL | Status: DC | PRN

## 2013-10-23 MED ORDER — ONDANSETRON HCL 4 MG/2ML IJ SOLN
4.0000 mg | Freq: Four times a day (QID) | INTRAMUSCULAR | Status: DC | PRN
  Administered 2013-10-23 – 2013-10-26 (×3): 4 mg via INTRAVENOUS
  Filled 2013-10-23 (×2): qty 2

## 2013-10-23 MED ORDER — ONDANSETRON HCL 4 MG/2ML IJ SOLN
INTRAMUSCULAR | Status: AC
  Filled 2013-10-23: qty 2

## 2013-10-23 MED ORDER — HEPARIN SODIUM (PORCINE) 1000 UNIT/ML DIALYSIS
3000.0000 [IU] | INTRAMUSCULAR | Status: DC | PRN
  Filled 2013-10-23: qty 3

## 2013-10-23 NOTE — Clinical Documentation Improvement (Signed)
Presents with shortness of breath, acute pulmonary edema, ESRD (missed last HD appointment), history of CHF, Renal Consult notes volume overload.  Please clarify the likely etiology of patient's acute pulmonary edema with shortness of breath:   Volume overload 2/2 ESRD  CHF  Other condition  Please document findings in progress note and discharge summary.   Thank You, Shellee MiloEileen T Fran Neiswonger ,RN Clinical Documentation Specialist:  [phone]  [facility]- Health Information Management Cell: [phone]

## 2013-10-23 NOTE — Progress Notes (Signed)
Late Entry:   Pt refusing lab draw and flu pcr swab. Jean PoagK. Kirby on call notified.

## 2013-10-23 NOTE — Progress Notes (Signed)
[name] ZOX:[id number] DOB: [birth year] DOA: 10/21/2013 PCP: No PCP Per Patient  Brief narrative: 63 y/o ?, ESRD-usually dialyzes at outpatient [place], HIV, H/o CHF, Polysubstance abuse, secondary hyperparathyroidism, chronic medical noncompliance admitted 10/22/2013 with noncompliance with medical therapy/dialysis and volume overload secondary to noncompliance with dialysis since 10/17/13 She was dialyzed on 10/22/13 4 L and is scheduled for further dialysis under the care of nephrology   Past medical history-As per Problem list Chart reviewed as below- Reviewed  Consultants:  Nephrology  Psychiatry  Procedures:  None  Antibiotics:  HAART therapy    Subjective  Complains of hip knee pain Tells me she has most of her care in [facility] Not suicidal currently Tells me she needs help from social worker in terms of living quarters Thinks that a couple of the children where she currently stays have the flu    Objective    Interim History: None  Telemetry: None   Objective: Filed Vitals:   10/22/13 1724 10/22/13 2216 10/23/13 0510 10/23/13 1000  BP: 150/89 139/81 152/82 135/80  Pulse: 106 101 101 99  Temp: 98.6 F (37 C) 98.6 F (37 C) 99.6 F (37.6 C) 98.8 F (37.1 C)  TempSrc: Oral Oral Oral Oral  Resp: 20 22 22 14   Height:      Weight:   114.2 kg (251 lb 12.3 oz)   SpO2: 96% 95% 94%     Intake/Output Summary (Last 24 hours) at 10/23/13 1320 Last data filed at 10/22/13 1700  Gross per 24 hour  Intake    240 ml  Output      0 ml  Net    240 ml    Exam:  General: Obese Cardiovascular: S1-S2 no rub or gallop Respiratory: Clinically clear Abdomen: Obese Skin no lower extremity edema intact Neuro intact  Data Reviewed: Basic Metabolic Panel:  Recent Labs Lab 10/21/13 2037 10/22/13 0530  NA 133* 136*  K 6.1* 5.8*  CL 89* 90*  CO2 20 21  GLUCOSE 92 137*  BUN 77* 82*  CREATININE 13.75* 14.57*  CALCIUM  9.6 9.4   Liver Function Tests: No results found for this basename: AST, ALT, ALKPHOS, BILITOT, PROT, ALBUMIN,  in the last 168 hours No results found for this basename: LIPASE, AMYLASE,  in the last 168 hours No results found for this basename: AMMONIA,  in the last 168 hours CBC:  Recent Labs Lab 10/21/13 2037 10/22/13 0530  WBC 7.1 6.5  NEUTROABS 4.2  --   HGB 10.0* 8.8*  HCT 30.1* 25.8*  MCV 105.2* 102.8*  PLT 250 230   Cardiac Enzymes:  Recent Labs Lab 10/22/13 0503 10/22/13 1400  TROPONINI <0.30 <0.30   BNP: No components found with this basename: POCBNP,  CBG: No results found for this basename: GLUCAP,  in the last 168 hours  No results found for this or any previous visit (from the past 240 hour(s)).   Studies:              All Imaging reviewed and is as per above notation   Scheduled Meds: . amLODipine  10 mg Oral Daily  . carvedilol  6.25 mg Oral BID WC  . darbepoetin (ARANESP) injection - DIALYSIS  40 mcg Intravenous Q Wed-HD  . FLUoxetine  10 mg Oral Daily  . lamiVUDine  50 mg Oral Daily  . raltegravir  400 mg Oral BID  . sevelamer carbonate  2,400 mg Oral TID WC  . sodium chloride  3 mL Intravenous Q12H  . [START ON 10/24/2013] tenofovir  300 mg Oral Q Fri  . traZODone  50 mg Oral QHS   Continuous Infusions:    Assessment/Plan: 1. End-stage renal disease,-[place]-appreciate nephrology input. Dry weight last discharge March 2014 105.4 kg, currently 114. 2. ? Flu-she states that she has been around to sick children and think she might have the flu. Influenza panel ordered. 3. Decompensated acute on chronic diastolic heart failure-see above 4. HIV-review of [facility] notes state that she has a lack of adherence and understanding of her medications-last CD4 12/2012 was 1318 and viral load was undetectable.  We will repeat her she's here-continued Lamivudine 50 daily, Raltegravir 400 bid, Tenofovir 300 daily 5. Chronic pain-se[place] Dr. Orvan [place]bell in [place]  [place] chronic pain management-currently not on narcotics according to their notes 6. Depression/suicidal ideation-appreciate psychiatrist input into her care, continue trazodone 50 each bedtime, fluoxetine 10 mg daily 7. Metabolic bone disease-continue 7 lumbar carbonate 2400 3 times a day with meals, 8. Hypertension-continue amlodipine 10, Coreg 6.25 twice a day  Code Status: Full Family Communication: None at bedside Disposition Plan: Inpatient   Pleas KochJai Clarity Ciszek, MD  [facility] Pager [phone] 10/23/2013, 1:20 PM    LOS: 2 days

## 2013-10-23 NOTE — Progress Notes (Signed)
[facility] Progress Note  Subjective:   I need to find a better place to stay. "Vomited all night"  They wanted to stick something all the way up my nose and I wouldn't let them; I know I have the flu.  Objective Filed Vitals:   10/22/13 1355 10/22/13 1724 10/22/13 2216 10/23/13 0510  BP: 156/99 150/89 139/81 152/82  Pulse: 102 106 101 101  Temp: 98 F (36.7 C) 98.6 F (37 C) 98.6 F (37 C) 99.6 F (37.6 C)  TempSrc: Oral Oral Oral Oral  Resp: 22 20 22 22   Height:      Weight:    114.2 kg (251 lb 12.3 oz)  SpO2: 100% 96% 95% 94%   Physical Exam sitter at bed sdie General: Talkative  Heart: RRR Lungs: coarse BS bases Abdomen: obese soft Extremities: no LE edema Dialysis Access: left AVF+ bruit  Dialysis Orders: Center: [facility] MWF 4 hr 180 450/800 EDW 112 2 K 2.5 Ca left AVF Epo 2800 (just increased from 2200 [address] 1000 no hectorol Recent labs: Hgb 8.6 1/7 tsat 20% 12/17 ferritin 776 11/12iPTH 338 11/22 - not on vitamin D - possibly because ^ Ca but noted on 2.5 Ca bath- previously on hectorol 4 8/22  Assessment/Plan:  1. SOB/Fever -refused Flu swab;SOB due to fluid overlaod due to missed HD; CXR shows edema; 6 kg above EDW; extra HD today to get to HD and then routine tomorrow (or could be postponed to Sat if not d/c Fri) 2. ESRD - MWF Treatment sheet from 1/07 showed she actually got below EDW but BP VERY HIGH - plan extra tmt today to lower volume and back on schedule Friday 3. Hypertension/volume - "BP very high at outpt dialysis - not sure if related to not taking med, needing EDW lowered or other - but it is obvious she could probably stand to have EDW lowered; on a very low dose coreg given degree on HTN; while BP is still high, it is much better than outpt BPs which suggest she is not taking meds; continue 4. Anemia - Hgb low, but stable, last IV Fe given 1/09 - looks like he finished a course and Epo just increased to 2800 - gave 40 of Aranesp with HD  1/14 (Wed); recheck pre HD today. 5. Metabolic bone disease - Not on hectorol at present - possibly due to prior ^ Ca - defer to her outpt HD center; on renvela and sensipar 6. Nutrition - renal diet + multivit 7. Depresion/suicidal ideation - psych has seen and meds changed; multiple medical issues, substance abuse and unstable living situations contributory 8. Substance abuse -  9. HIV 10. Medical/dialysis noncompliance  Sheffield Slider, PA-C [facility] Beeper [phone] 10/23/2013,9:25 AM  LOS: 2 days   I have seen and examined patient, discussed with PA and agree with assessment and plan as outlined above. Vinson Moselle MD pager [phone]    cell [phone] 10/23/2013, 12:13 PM    Additional Objective Labs: Basic Metabolic Panel:  Recent Labs Lab 10/21/13 2037 10/22/13 0530  NA 133* 136*  K 6.1* 5.8*  CL 89* 90*  CO2 20 21  GLUCOSE 92 137*  BUN 77* 82*  CREATININE 13.75* 14.57*  CALCIUM 9.6 9.4  CBC:  Recent Labs Lab 10/21/13 2037 10/22/13 0530  WBC 7.1 6.5  NEUTROABS 4.2  --   HGB 10.0* 8.8*  HCT 30.1* 25.8*  MCV 105.2* 102.8*  PLT 250 230  Cardiac Enzymes:  Recent Labs  Lab 10/22/13 0503 10/22/13 1400  TROPONINI <0.30 <0.30  Studies/Results: Dg Chest 2 View  10/21/2013   CLINICAL DATA:  Cough. Shortness of breath. chest pain. Current history of HIV, end-stage renal disease, and hypertension.  EXAM: CHEST  2 VIEW  COMPARISON:  Portable examinations 10/06/2013, 05/25/2013. Two-view chest x-ray 09/29/2013, 07/31/2013.  FINDINGS: Cardiac silhouette mildly enlarged but stable. Mild diffuse interstitial pulmonary edema, best visualized on the lateral image. No confluent airspace consolidation. Linear atelectasis in the lower lobes and the inferior upper lobes bilaterally. No pleural effusions. Degenerative changes involving the thoracic spine.  IMPRESSION: Mild CHF, with mild cardiomegaly and mild diffuse interstitial pulmonary edema.    Electronically Signed   By: Hulan Saashomas  Lawrence M.D.   On: 10/21/2013 21:38   Medications:   . amLODipine  10 mg Oral Daily  . carvedilol  6.25 mg Oral BID WC  . darbepoetin (ARANESP) injection - DIALYSIS  40 mcg Intravenous Q Wed-HD  . FLUoxetine  10 mg Oral Daily  . lamiVUDine  50 mg Oral Daily  . raltegravir  400 mg Oral BID  . sevelamer carbonate  2,400 mg Oral TID WC  . sodium chloride  3 mL Intravenous Q12H  . [START ON 10/24/2013] tenofovir  300 mg Oral Q Fri  . traZODone  50 mg Oral QHS

## 2013-10-23 NOTE — Progress Notes (Signed)
INITIAL NUTRITION ASSESSMENT  DOCUMENTATION CODES Per approved criteria  -Morbid Obesity   INTERVENTION: Add Nepro Shake po daily, each supplement provides 425 kcal and 19 grams . Encouraged kcal/protein intake. RD to continue to follow nutrition care plan.  NUTRITION DIAGNOSIS: Increased nutrient needs related to HIV and HD as evidenced by estimated needs.   Goal: Intake to meet >90% of estimated nutrition needs.  Monitor:  weight trends, lab trends, I/O's, PO intake, supplement tolerance  Reason for Assessment: Malnutrition Screening Tool  [age] y.o. female  Admitting Dx: Pulmonary edema, acute  ASSESSMENT: PMHx significant for HIV, ESRD (HD on MWF). Admitted with SOB x 1 day, fever, skipped 3 most recent HD sessions 2/2 "not feeling well." Work-up reveals acute pulmonary edema. Refused Flu PCR swab.  Pt expressed suicidal ideations with plan on admission. Pt with long hx of cocaine abuse and unstable living situations.  Currently ordered for Renal 60-70 diet. Meal intake is 25-75% of meals. Pt reports that her dinner was great last night but didn't eat well this morning 2/2 food choices. Intake has overall improved since PTA, said that she had "a lot going on at home." Reports that she doesn't really listen to her HD RD. Agreeable to Nepro daily.  Potassium is elevated at 5.8, but trending down as pt receives HD.  Height: Ht Readings from Last 1 Encounters:  10/22/13 5\' 4"  (1.626 m)    Weight: Wt Readings from Last 1 Encounters:  10/23/13 251 lb 12.3 oz (114.2 kg)    Ideal Body Weight: 120 lb  % Ideal Body Weight: 209%  Wt Readings from Last 10 Encounters:  10/23/13 251 lb 12.3 oz (114.2 kg)  10/01/13 250 lb 3.6 oz (113.5 kg)  02/12/13 235 lb (106.595 kg)  12/13/12 239 lb 13.8 oz (108.8 kg)  10/03/12 235 lb (106.595 kg)  10/03/12 235 lb (106.595 kg)  05/14/12 223 lb 5.2 oz (101.3 kg)    Usual Body Weight: 112 kg (dry weight per renal)  % Usual Body Weight:  102%  BMI:  Body mass index is 43.19 kg/(m^2). Obese Class III  Estimated Nutritional Needs: Kcal: 1900 - 2100 Protein: 110 - 125 g protein daily Fluid: 1.2 liters  Skin: intact  Diet Order: Renal  EDUCATION NEEDS: -No education needs identified at this time   Intake/Output Summary (Last 24 hours) at 10/23/13 0957 Last data filed at 10/22/13 1700  Gross per 24 hour  Intake    240 ml  Output   3900 ml  Net  -3660 ml    Last BM: 1/13  Labs:   Recent Labs Lab 10/21/13 2037 10/22/13 0530  NA 133* 136*  K 6.1* 5.8*  CL 89* 90*  CO2 20 21  BUN 77* 82*  CREATININE 13.75* 14.57*  CALCIUM 9.6 9.4  GLUCOSE 92 137*    CBG (last 3)  No results found for this basename: GLUCAP,  in the last 72 hours  Scheduled Meds: . amLODipine  10 mg Oral Daily  . carvedilol  6.25 mg Oral BID WC  . darbepoetin (ARANESP) injection - DIALYSIS  40 mcg Intravenous Q Wed-HD  . FLUoxetine  10 mg Oral Daily  . lamiVUDine  50 mg Oral Daily  . raltegravir  400 mg Oral BID  . sevelamer carbonate  2,400 mg Oral TID WC  . sodium chloride  3 mL Intravenous Q12H  . [START ON 10/24/2013] tenofovir  300 mg Oral Q Fri  . traZODone  50 mg Oral QHS  Continuous Infusions:   Past Medical History  Diagnosis Date  . Hypertension   . HIV infection   . Chronic renal failure   . CHF (congestive heart failure)   . ESRD (end stage renal disease) on dialysis 09/30/2013    On dialysis at [facility], per [facility] physicians.  Started dialysis in 2008.  Takes HD MWF schedule.      Past Surgical History  Procedure Laterality Date  . Arteriovenous graft placement      Jean Motto MS, RD, LDN Pager: [phone] After-hours pager: [phone]

## 2013-10-23 NOTE — Progress Notes (Signed)
Patient has been very irritable since coming back from HD,has been asking for her amitriptiline medication.Explained to patient that the medicine was dc'd and replaced with a different medicine. Travius Crochet Joselita,RN

## 2013-10-24 LAB — CBC
HCT: 28.7 % — ABNORMAL LOW (ref 36.0–46.0)
Hemoglobin: 9.3 g/dL — ABNORMAL LOW (ref 12.0–15.0)
MCH: 34.7 pg — ABNORMAL HIGH (ref 26.0–34.0)
MCHC: 32.4 g/dL (ref 30.0–36.0)
MCV: 107.1 fL — ABNORMAL HIGH (ref 78.0–100.0)
Platelets: 198 10*3/uL (ref 150–400)
RBC: 2.68 MIL/uL — ABNORMAL LOW (ref 3.87–5.11)
RDW: 14 % (ref 11.5–15.5)
WBC: 5.1 10*3/uL (ref 4.0–10.5)

## 2013-10-24 LAB — RENAL FUNCTION PANEL
Albumin: 3.6 g/dL (ref 3.5–5.2)
BUN: 23 mg/dL (ref 6–23)
CO2: 26 mEq/L (ref 19–32)
Calcium: 9.8 mg/dL (ref 8.4–10.5)
Chloride: 95 mEq/L — ABNORMAL LOW (ref 96–112)
Creatinine, Ser: 6.95 mg/dL — ABNORMAL HIGH (ref 0.50–1.10)
GFR calc Af Amer: 7 mL/min — ABNORMAL LOW (ref >90)
GFR calc non Af Amer: 6 mL/min — ABNORMAL LOW (ref >90)
Glucose, Bld: 109 mg/dL — ABNORMAL HIGH (ref 70–99)
Phosphorus: 5 mg/dL — ABNORMAL HIGH (ref 2.3–4.6)
Potassium: 4.5 mEq/L (ref 3.7–5.3)
Sodium: 139 mEq/L (ref 137–147)

## 2013-10-24 MED ORDER — ACETAMINOPHEN 325 MG PO TABS
650.0000 mg | ORAL_TABLET | ORAL | Status: DC | PRN
  Administered 2013-10-24 – 2013-10-28 (×10): 650 mg via ORAL
  Filled 2013-10-24 (×8): qty 2

## 2013-10-24 MED ORDER — SORBITOL 70 % SOLN
15.0000 mL | Freq: Once | Status: DC
  Filled 2013-10-24: qty 30

## 2013-10-24 MED ORDER — GUAIFENESIN 100 MG/5ML PO SYRP
200.0000 mg | ORAL_SOLUTION | ORAL | Status: DC | PRN
  Administered 2013-10-24 (×2): 200 mg via ORAL
  Filled 2013-10-24 (×2): qty 10

## 2013-10-24 MED ORDER — SORBITOL 70 % SOLN
15.0000 mL | Freq: Every day | Status: DC | PRN
  Administered 2013-10-24 – 2013-10-27 (×2): 15 mL via ORAL
  Filled 2013-10-24 (×3): qty 30

## 2013-10-24 MED ORDER — ACETAMINOPHEN 325 MG PO TABS
ORAL_TABLET | ORAL | Status: AC
  Filled 2013-10-24: qty 2

## 2013-10-24 NOTE — Clinical Social Work Psychosocial (Signed)
Clinical Social Work Department BRIEF PSYCHOSOCIAL ASSESSMENT 10/24/2013  Patient:  [name],[name]     Account Number:  [id number]     Admit date:  10/21/2013  Clinical Social Worker:  [name],Jean Kinley, LCSW  Date/Time:  10/24/2013 12:35 PM  Referred by:  Physician  Date Referred:  10/23/2013 Referred for  SNF Placement   Other Referral:   Interview type:  Patient Other interview type:    PSYCHOSOCIAL DATA Living Status:  FAMILY Admitted from facility:   Level of care:   Primary support name:   Primary support relationship to patient:   Degree of support available:   Patient reports that she lives with her niece [name] and her 5 children. Patient also reported that niece does not provide any assistance to her.    CURRENT CONCERNS Current Concerns  Post-Acute Placement   Other Concerns:    SOCIAL WORK ASSESSMENT / PLAN On 10/23/13, CSW talked with patient about discharge planning. [name] stated that she does not want to return to her nieces home as niece unable to assist her. [name] reported that she does have an electric wheelchair at her niece's home and she also has a new electric wheelchair at a person's home in [place]. CSW and patient also discussed ALF versus SNF search and patient's difficulty ambulating at this time, along with the benefits of SNF for ST rehab and patient in agreement with SNF for rehab first, then moving to an Assisted Living Facility. CSW talked with patient about SNF search and inquired as to what county/counties to search and she requested [place].    [facility] [place] dialysis. Patient does not foresee a problem with [place] until she can be [place] a [place].    Patient's drug use discussed and patient reported that she was staying with a person who was being  paid to take care of her and it was while with this person that she used cocaine. Patient expressed that she does want to stop using drugs and is receptive to getting assistance with cessation.   Assessment/plan status:  Psychosocial Support/Ongoing Assessment of Needs Other assessment/ plan:   Information/referral to community resources:   [place] [facility] [place]    PATIENT'S/FAMILY'S RESPONSE TO PLAN OF CARE: [name] was in pain, but receptive to talking with CSW about discharge planning as she wants to move from her current residence to a SNF and then eventually to an Assisted Living Facility.

## 2013-10-24 NOTE — Progress Notes (Signed)
10/24/2013 11:09 AM  Pt returned from HD around 1015.  Upon return, environmental search was conducted and room clear.  Sitter at bedside.  Pt seems to be anxious and restless, but in an ok mood.  Pt requesting something for constipation, as well as something to prevent blood clots and wants to order a different lunch tray.  All items were addressed.  Pt stated that she was still having suicidal ideation at times.  Pt given emotional support.  Will continue to monitor patient. Theadora RamaKIRKMAN, Neyra Pettie Brooke

## 2013-10-24 NOTE — Progress Notes (Signed)
Subjective:  Ended dialysis 2 hours early secondary to abdominal pain due to constipation, Sorbitol now ordered; no other complaints.  Objective: Vital signs in last 24 hours: Temp:  [97.9 F (36.6 C)-99.9 F (37.7 C)] 97.9 F (36.6 C) (01/16 1006) Pulse Rate:  [92-111] 102 (01/16 1006) Resp:  [16-20] 20 (01/16 0928) BP: (79-156)/(57-98) 119/57 mmHg (01/16 1006) SpO2:  [92 %-100 %] 92 % (01/16 1006) Weight:  [113.2 kg (249 lb 9 oz)-122.1 kg (269 lb 2.9 oz)] 122.1 kg (269 lb 2.9 oz) (01/16 0928) Weight change: -2.4 kg (-5 lb 4.7 oz)  Intake/Output from previous day: 01/15 0701 - 01/16 0700 In: 960 [P.O.:960] Out: 3043  Intake/Output this shift: Total I/O In: 3 [I.V.:3] Out: 1223 [Other:1223]  Lab Results:  Recent Labs  10/23/13 1642 10/24/13 0708  WBC 4.6 5.1  HGB 8.5* 9.3*  HCT 26.7* 28.7*  PLT 189 198   BMET:  Recent Labs  10/23/13 1642 10/24/13 0708  NA 140 139  K 4.9 4.5  CL 94* 95*  CO2 27 26  GLUCOSE 134* 109*  BUN 46* 23  CREATININE 10.56* 6.95*  CALCIUM 8.8 9.8  ALBUMIN 3.4* 3.6   No results found for this basename: PTH,  in the last 72 hours Iron Studies: No results found for this basename: IRON, TIBC, TRANSFERRIN, FERRITIN,  in the last 72 hours  EXAM: General appearance:  Alert, in no apparent distress Resp:  CTA without rales, rhonchi, or wheezes Cardio:  RRR without murmur or rub GI: + BS, soft and nontender Extremities:  No edema Access:  AVF @ LFA with + bruit  Dialysis Orders: [facility] M[facility] 4 hr 180 450/800 EDW 112 2 K 2.5 Ca left AVF Epo 2800 (just increased from [address] 1000 no hectorol Recent labs: Hgb 8.6 1/7 tsat 20% 12/17 ferritin 776 11/12iPTH 338 11/22 - not on vitamin D - possibly because ^ Ca but noted on 2.5 Ca bath- previously on hectorol 4 8/22   Assessment/Plan: 1. Dyspnea - sec to fluid overload, much better s/p HD. 2. [facility]; K 4.5 pre-HD today, but signed off 2 hrs  early. 3. HTN/Volume - BP 119/57 on Amlodipine 10 mg qd & Carvedilol 6.25 mg bid; still 10 L over EDW after 3 straight days of HD (net UF 1.2 L today in 2 hrs). 4. Anemia - Hgb up to 9.3, Aranesp 25 mcg on Wed. 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 6. Nutrition - Alb 3.6, renal diet. 7. Depression/suicidal ideation - seen by Psychiatry, started Fluoxetine for depression, Trazodone for insomnia. 8. HIV - on antivirals.   LOS: 3 days   [name],CHARLES 10/24/2013,1:50 PM  I have seen and examined patient, discussed with PA and agree with assessment and plan as outlined above. Vinson Moselleob Sharin Altidor MD pager [phone]    cell [phone] 10/24/2013, 2:37 PM

## 2013-10-24 NOTE — Progress Notes (Signed)
[name] NWG:[id number] DOB: 1951-04-11 DOA: 10/21/2013 PCP: No PCP Per Patient  Brief narrative: 63 y/o ?, ESRD-usually dialyzes at outpatient HD-[place], HIV, H/o CHF, Polysubstance abuse, secondary hyperparathyroidism, chronic medical noncompliance admitted 10/22/2013 with noncompliance with medical therapy/dialysis and volume overload secondary to noncompliance with dialysis since 10/17/13 She was dialyzed on 10/22/13  4L and is scheduled for further dialysis under the care of nephrology. She only dialyzed 2 hours on 1/16, and states that she has shoulder pain   Past medical history-As per Problem list Chart reviewed as below- Reviewed  Consultants:  Nephrology  Psychiatry  Procedures:  None  Antibiotics:  HAART therapy    Subjective   Well Didn't want to work with ytherpy as had shoulder pain Tolerated only to hours of dialysis, states that she had neck pain stomach pain and had 2 stools at the time Currently in no pain or distress   Objective    Interim History: None  Telemetry: None   Objective: Filed Vitals:   10/24/13 0900 10/24/13 0918 10/24/13 0928 10/24/13 1006  BP: 96/63 79/67 110/67 119/57  Pulse: 97 111 95 102  Temp:   99 F (37.2 C) 97.9 F (36.6 C)  TempSrc:   Oral Oral  Resp:   20   Height:      Weight:   122.1 kg (269 lb 2.9 oz)   SpO2:   98% 92%    Intake/Output Summary (Last 24 hours) at 10/24/13 1233 Last data filed at 10/24/13 1019  Gross per 24 hour  Intake    723 ml  Output   4266 ml  Net  -3543 ml    Exam:  General: Obese Cardiovascular: S1-S2 no rub or gallop Respiratory: Clinically clear Abdomen: Obese Skin no lower extremity edema intact MSK-range of motion to right shoulder is intact, flexion extension external rotation and abduction as well as adduction is within normal limits Speed test, Neer test, Hawkins test are negative Neuro intact  Data Reviewed: Basic Metabolic Panel:  Recent  Labs Lab 10/21/13 2037 10/22/13 0530 10/23/13 1642 10/24/13 0708  NA 133* 136* 140 139  K 6.1* 5.8* 4.9 4.5  CL 89* 90* 94* 95*  CO2 20 21 27 26   GLUCOSE 92 137* 134* 109*  BUN 77* 82* 46* 23  CREATININE 13.75* 14.57* 10.56* 6.95*  CALCIUM 9.6 9.4 8.8 9.8  PHOS  --   --  6.2* 5.0*   Liver Function Tests:  Recent Labs Lab 10/23/13 1642 10/24/13 0708  ALBUMIN 3.4* 3.6   No results found for this basename: LIPASE, AMYLASE,  in the last 168 hours No results found for this basename: AMMONIA,  in the last 168 hours CBC:  Recent Labs Lab 10/21/13 2037 10/22/13 0530 10/23/13 1642 10/24/13 0708  WBC 7.1 6.5 4.6 5.1  NEUTROABS 4.2  --   --   --   HGB 10.0* 8.8* 8.5* 9.3*  HCT 30.1* 25.8* 26.7* 28.7*  MCV 105.2* 102.8* 109.0* 107.1*  PLT 250 230 189 198   Cardiac Enzymes:  Recent Labs Lab 10/22/13 0503 10/22/13 1400  TROPONINI <0.30 <0.30   BNP: No components found with this basename: POCBNP,  CBG: No results found for this basename: GLUCAP,  in the last 168 hours  No results found for this or any previous visit (from the past 240 hour(s)).   Studies:              All Imaging reviewed and is as per above notation  Scheduled Meds: . amLODipine  10 mg Oral Daily  . carvedilol  6.25 mg Oral BID WC  . darbepoetin (ARANESP) injection - DIALYSIS  40 mcg Intravenous Q Wed-HD  . FLUoxetine  10 mg Oral Daily  . lamiVUDine  50 mg Oral Daily  . raltegravir  400 mg Oral BID  . sevelamer carbonate  2,400 mg Oral TID WC  . sodium chloride  3 mL Intravenous Q12H  . tenofovir  300 mg Oral Q Fri  . traZODone  50 mg Oral QHS   Continuous Infusions:    Assessment/Plan: 1. End-stage renal disease,-[place]-appreciate nephrology input. Dry weight last discharge March 2014 105.4 kg, currently 122 2. Shoulder pain-unclear etiology. Her exam is benign. Do not give opiates. 3. ? Flu-she states that she has been around to sick children and think she might have the flu.  Influenza panel ordered. 4. Decompensated acute on chronic diastolic heart failure-see above 5. HIV-review of [facility] notes state that she has a lack of adherence and understanding of her medications-last CD4 12/2012 was 1318 and viral load was undetectable.  We will repeat her she's here-continued Lamivudine 50 daily, Raltegravir 400 bid, Tenofovir 300 daily 6. Chronic pain-se[place] Dr. Orvan [place]bell [place] chronic pain management-currently not on narcotics according to their notes 7. Depression/suicidal ideation-appreciate psychiatrist input into her care, continue trazodone 50 each bedtime, fluoxetine 10 mg daily 8. Metabolic bone disease-continue 7 lumbar carbonate 2400 3 times a day with meals, 9. Hypertension-continue amlodipine 10, Coreg 6.25 twice a day  Code Status: Full Family Communication: None at bedside Disposition Plan: Inpatient   Pleas KochJai Brandon Scarbrough, MD  [facility] Pager [phone] 10/24/2013, 12:33 PM    LOS: 3 days

## 2013-10-24 NOTE — Evaluation (Signed)
Physical Therapy Evaluation Patient Details Name: Jean CooleyMargaret Ann [name] MRN: [id number] DOB: [birth year] Today's Date: 10/24/2013 Time: 1450-1510 PT Time Calculation (min): 20 min  PT Assessment / Plan / Recommendation History of Present Illness  Patient is a 63 y/o female admitted with fluid overload due to missing HD since 1/9.  Clinical Impression  Patient presents with decreased independence with mobility due to deficits listed below and will benefit from skilled PT in the acute setting to allow return to independence following SNF level rehab stay.    PT Assessment  Patient needs continued PT services    Follow Up Recommendations  SNF    Does the patient have the potential to tolerate intense rehabilitation    No  Barriers to Discharge  None      Equipment Recommendations  None recommended by PT    Recommendations for Other Services   None  Frequency Min 3X/week    Precautions / Restrictions Precautions Precautions: Fall Precaution Comments: Droplet   Pertinent Vitals/Pain Right hip pain with mobility      Mobility  Bed Mobility Overal bed mobility: Needs Assistance Bed Mobility: Sit to Supine Sit to supine: Mod assist General bed mobility comments: assist for legs into bed Transfers Overall transfer level: Needs assistance Equipment used: Rolling walker (2 wheeled) Transfers: Stand Pivot Transfers Stand pivot transfers: Min assist;Mod assist General transfer comment: assist up from [facility] and onto scale with UE support, then back to bed    Exercises     PT Diagnosis: Difficulty walking;Acute pain  PT Problem List: Decreased strength;Decreased activity tolerance;Decreased balance;Decreased mobility;Decreased knowledge of use of DME;Pain;Decreased safety awareness PT Treatment Interventions: DME instruction;Therapeutic activities;Therapeutic exercise;Patient/family education;Functional mobility training;Neuromuscular re-education;Gait training;Balance training      PT Goals(Current goals can be found in the care plan section) Acute Rehab PT Goals Patient Stated Goal: Not to go home PT Goal Formulation: With patient Time For Goal Achievement: 11/07/13 Potential to Achieve Goals: Good  Visit Information  Last PT Received On: 10/24/13 Assistance Needed: +1 History of Present Illness: Patient is a 63 y/o female admitted with fluid overload due to missing HD since 1/9.       Prior Functioning  Home Living Family/patient expects to be discharged to:: Skilled nursing facility Living Arrangements: Other relatives Additional Comments: was living in one level home with 8 steps to enter with great difficulty negotiating steps and used power w/c in the home despite difficulty fitting it into bathroom Prior Function Level of Independence: Needs assistance Gait / Transfers Assistance Needed: states neice would assist her down stairs and bring w/c down with help of her spouse to allow her to go to dialysis    Cognition  Cognition Arousal/Alertness: Awake/alert Behavior During [facility]: [facility] for tasks assessed/performed Overall Cognitive Status: Within Functional Limits for tasks assessed    Extremity/Trunk Assessment Lower Extremity Assessment Lower Extremity Assessment: Generalized weakness (would not let me assist ROM due to pain right hip after up OOB)   Balance Balance Overall balance assessment: Needs assistance Sitting balance-Leahy Scale: Good Standing balance-Leahy Scale: Fair Standing balance comment: static standing without support; with movement places hands somewhere due to painful right hip  End of Session PT - End of Session Activity Tolerance: Patient limited by pain Patient left: in bed;with call bell/phone within reach  GP     [facility],CYNDI 10/24/2013, 5:02 PM [place] Janeth Terry, [place] [phone] 10/24/2013

## 2013-10-24 NOTE — Clinical Social Work Placement (Addendum)
Clinical Social Work Department CLINICAL SOCIAL WORK PLACEMENT NOTE 10/24/2013  Patient:  [name],[name]  Account Number:  [id number] Admit date:  10/21/2013  Clinical Social Worker:  Genelle BalVANESSA Jelissa Espiritu, LCSW  Date/time:  10/24/2013 05:27 AM  Clinical Social Work is seeking post-discharge placement for this patient at the following level of care:   SKILLED NURSING   (*CSW will update this form in Epic as items are completed)   10/23/2013  Patient/family provided with [facility] Department of Clinical Social Work's list of facilities offering this level of care within the geographic area requested by the patient (or if unable, by the patient's family).  10/23/2013  Patient/family informed of their freedom to choose among providers that offer the needed level of care, that participate in Medicare, Medicaid or managed care program needed by the patient, have an available bed and are willing to accept the patient.    Patient/family informed of [facility]' ownership interest in [facility], as well as of the fact that they are under no obligation to receive care at this facility.  PASARR submitted to EDS on 10/23/2013 PASARR number received from EDS on 10/24/2013  FL2 transmitted to all facilities in geographic area requested by pt/family on  10/23/2013 FL2 transmitted to all facilities within larger geographic area on   Patient informed that his/her managed care company has contracts with or will negotiate with  certain facilities, including the following:     Patient/family informed of bed offers received:  10/24/2013 [facility] on 10/27/13 Physician recommends and patient chooses bed at    Patient to be transferred to a family member's home on 10/28/13  Patient to be transferred to family member's home on 10/28/13   The following physician request were entered in Epic:   Additional Comments: 10/24/13 - Patient continues to have a  sitter. 10/27/13 - Patient continues to have a sitter.

## 2013-10-25 DIAGNOSIS — I10 Essential (primary) hypertension: Secondary | ICD-10-CM

## 2013-10-25 MED ORDER — GUAIFENESIN-DM 100-10 MG/5ML PO SYRP
5.0000 mL | ORAL_SOLUTION | ORAL | Status: DC | PRN
  Administered 2013-10-25 – 2013-10-27 (×6): 5 mL via ORAL
  Filled 2013-10-25 (×6): qty 5

## 2013-10-25 MED ORDER — OSELTAMIVIR PHOSPHATE 30 MG PO CAPS
30.0000 mg | ORAL_CAPSULE | Freq: Every day | ORAL | Status: DC
  Administered 2013-10-25 – 2013-10-26 (×2): 30 mg via ORAL
  Filled 2013-10-25 (×3): qty 1

## 2013-10-25 MED ORDER — OSELTAMIVIR PHOSPHATE 75 MG PO CAPS: 75.0000 mg | ORAL_CAPSULE | Freq: Two times a day (BID) | ORAL | Status: DC

## 2013-10-25 NOTE — Progress Notes (Addendum)
Subjective:  Feeling better after BM yesterday, but upper chest congestion and tightness with cough, states no improvement with Robitussin.  Objective: Vital signs in last 24 hours: Temp:  [97.9 F (36.6 C)-99 F (37.2 C)] 97.9 F (36.6 C) (01/17 0438) Pulse Rate:  [89-111] 89 (01/17 0438) Resp:  [20] 20 (01/17 0438) BP: (79-133)/(57-98) 133/67 mmHg (01/17 0438) SpO2:  [92 %-99 %] 99 % (01/17 0438) Weight:  [122.1 kg (269 lb 2.9 oz)] 122.1 kg (269 lb 2.9 oz) (01/16 0928) Weight change: 6.6 kg (14 lb 8.8 oz)  Intake/Output from previous day: 01/16 0701 - 01/17 0700 In: 243 [P.O.:240; I.V.:3] Out: 1473 [Urine:250]   Lab Results:  Recent Labs  10/23/13 1642 10/24/13 0708  WBC 4.6 5.1  HGB 8.5* 9.3*  HCT 26.7* 28.7*  PLT 189 198   BMET:  Recent Labs  10/23/13 1642 10/24/13 0708  NA 140 139  K 4.9 4.5  CL 94* 95*  CO2 27 26  GLUCOSE 134* 109*  BUN 46* 23  CREATININE 10.56* 6.95*  CALCIUM 8.8 9.8  ALBUMIN 3.4* 3.6   No results found for this basename: PTH,  in the last 72 hours Iron Studies: No results found for this basename: IRON, TIBC, TRANSFERRIN, FERRITIN,  in the last 72 hours  EXAM:  General appearance: Alert, in no apparent distress  Resp: R clear, still rales L base, occ exp wheezes  Cardio: RRR without murmur or rub  GI: + BS, soft and nontender  Extremities: No edema  Access: AVF @ LFA with + bruit   Dialysis Orders: [facility] 4 hr 180 450/800 EDW 112 2 K 2.5 Ca left AVF Epo 2800 (just increased from [address] 1000 no hectorol Recent labs: Hgb 8.6 1/7 tsat 20% 12/17 ferritin 776 11/12iPTH 338 11/22 - not on vitamin D - possibly because ^ Ca but noted on 2.5 Ca bath- previously on hectorol 4 8/22   Assessment/Plan: 1. Dyspnea- combination vol excess/mild pulm edema + prob infection (flu, bronchitis, pna) Volume has been lowered and is now down below dry wt and this should not be an issue now. Persistent cough pt complaining, consider flu  or PNA, have d/w primary MD 2. ESRD - [facility] [place]; K 4.5 pre-HD yesterday, but signed off 2 hrs early.  Next HD likely on 1/19. 3. HTN/Volume - BP 133/67 on Amlodipine 10 mg qd & Carvedilol 6.25 mg bid; wt 111.1 kg s/p net UF 1.2 L yesterday.  4. Anemia - Hgb up to 9.3, Aranesp 25 mcg on Wed.  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  6. Nutrition - Alb 3.6, renal diet.  7. Depression/suicidal ideation - seen by Psychiatry, started Fluoxetine for depression, Trazodone for insomnia.  8. HIV - on antivirals 9. 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 Vinson Moselleob Termaine Roupp MD pager [phone]    cell [phone] 10/25/2013, 11:07 AM

## 2013-10-25 NOTE — Progress Notes (Signed)
[facility] PROGRESS NOTE Assessment/Plan: Dyspnea due Acute on chronic diastolic heart failure - Multifactorial vol excess/mild pulm edema + prob infection ( influenza), HD an still SOB. - Avoid opiates. - she agreed on asal swab. Start empiric treatment for Influenza.   [facility]; K 4.5 pre-HD yesterday, per renal. - next HD 1.19. - Sec HPT - Ca 10.1 corrected, P 5; no Hectorol, Renvela  HTN: - BP 133/67 on Amlodipine 10 mg qd & Carvedilol 6.25 mg bid;   Anemia  - Hgb up to 9.3, Aranesp 25 mcg on Wed.   Depression/suicidal ideation  - seen by Psychiatry, started Fluoxetine for depression, Trazodone for insomnia.   HIV  - on antivirals    Code Status: Full  Family Communication: None at bedside  Disposition Plan: Inpatient    Consultants:  renal  Procedures:  HD  Antibiotics:  none (indicate start date, and stop date if known)  HPI/Subjective: Still SOB  Objective: Filed Vitals:   10/25/13 0438 10/25/13 0755 10/25/13 1000 10/25/13 1416  BP: 133/67 135/71  114/72  Pulse: 89 84  81  Temp: 97.9 F (36.6 C) 98.5 F (36.9 C)  99.5 F (37.5 C)  TempSrc: Oral Oral  Oral  Resp: 20 20  20   Height:      Weight:   111.7 kg (246 lb 4.1 oz)   SpO2: 99% 99%  98%    Intake/Output Summary (Last 24 hours) at 10/25/13 1439 Last data filed at 10/25/13 0915  Gross per 24 hour  Intake    480 ml  Output    250 ml  Net    230 ml   Filed Weights   10/24/13 0656 10/24/13 0928 10/25/13 1000  Weight: 113.7 kg (250 lb 10.6 oz) 122.1 kg (269 lb 2.9 oz) 111.7 kg (246 lb 4.1 oz)    Exam:  General: Alert, awake, oriented x3, in no acute distress.  HEENT: No bruits, no goiter.  Heart: Regular rate and rhythm, without murmurs, rubs, gallops.  Lungs: Good air movement, clear to auscultation. Abdomen: Soft, nontender, nondistended, positive bowel sounds.  Neuro: Grossly intact, nonfocal.   Data Reviewed: Basic Metabolic Panel:  Recent  Labs Lab 10/21/13 2037 10/22/13 0530 10/23/13 1642 10/24/13 0708  NA 133* 136* 140 139  K 6.1* 5.8* 4.9 4.5  CL 89* 90* 94* 95*  CO2 20 21 27 26   GLUCOSE 92 137* 134* 109*  BUN 77* 82* 46* 23  CREATININE 13.75* 14.57* 10.56* 6.95*  CALCIUM 9.6 9.4 8.8 9.8  PHOS  --   --  6.2* 5.0*   Liver Function Tests:  Recent Labs Lab 10/23/13 1642 10/24/13 0708  ALBUMIN 3.4* 3.6   No results found for this basename: LIPASE, AMYLASE,  in the last 168 hours No results found for this basename: AMMONIA,  in the last 168 hours CBC:  Recent Labs Lab 10/21/13 2037 10/22/13 0530 10/23/13 1642 10/24/13 0708  WBC 7.1 6.5 4.6 5.1  NEUTROABS 4.2  --   --   --   HGB 10.0* 8.8* 8.5* 9.3*  HCT 30.1* 25.8* 26.7* 28.7*  MCV 105.2* 102.8* 109.0* 107.1*  PLT 250 230 189 198   Cardiac Enzymes:  Recent Labs Lab 10/22/13 0503 10/22/13 1400  TROPONINI <0.30 <0.30   BNP (last 3 results)  Recent Labs  09/29/13 2359 10/06/13 0334 10/21/13 2037  PROBNP 31055.0* 31834.0* 23932.0*   CBG: No results found for this basename: GLUCAP,  in the last 168  hours  No results found for this or any previous visit (from the past 240 hour(s)).   Studies: No results found.  Scheduled Meds: . amLODipine  10 mg Oral Daily  . carvedilol  6.25 mg Oral BID WC  . darbepoetin (ARANESP) injection - DIALYSIS  40 mcg Intravenous Q Wed-HD  . FLUoxetine  10 mg Oral Daily  . lamiVUDine  50 mg Oral Daily  . oseltamivir  30 mg Oral Daily  . raltegravir  400 mg Oral BID  . sevelamer carbonate  2,400 mg Oral TID WC  . sodium chloride  3 mL Intravenous Q12H  . sorbitol  15 mL Oral Once  . tenofovir  300 mg Oral Q Fri  . traZODone  50 mg Oral QHS   Continuous Infusions:    Marinda Elk  [facility] Pager [phone]. If 8PM-8AM, please contact night-coverage at [email or web address], password [facility] 10/25/2013, 2:39 PM  LOS: 4 days

## 2013-10-26 MED ORDER — DARBEPOETIN ALFA-POLYSORBATE 100 MCG/0.5ML IJ SOLN: 100.0000 ug | INTRAMUSCULAR | Status: DC

## 2013-10-26 NOTE — Progress Notes (Signed)
[facility] PROGRESS NOTE Assessment/Plan: Dyspnea due Acute on chronic diastolic heart failure - Multifactorial vol excess/mild pulm edema + prob infection ( influenza), HD an still SOB. - Avoid opiates. - Refused nasal swab. Cont tamiflu. >90% on RA. - Awaiting placement for SNF.   ESRD - HD on MWF in [facility] [facility]; K 4.5 pre-HD yesterday, per renal. - next HD 1.19. - Sec HPT - Ca 10.1 corrected, P 5; no Hectorol, Renvela  HTN: - BP stable on Amlodipine 10 mg qd & Carvedilol 6.25 mg bid;   Anemia  - Hgb up to 9.3, Aranesp 25 mcg on Wed.   Depression/suicidal ideation  - seen by Psychiatry, started Fluoxetine for depression, Trazodone for insomnia.   HIV  - on antivirals    Code Status: Full  Family Communication: None at bedside  Disposition Plan: Inpatient    Consultants:  renal  Procedures:  HD  Antibiotics:  none (indicate start date, and stop date if known)  HPI/Subjective: SOB improved, cough resolved.  Objective: Filed Vitals:   10/25/13 1416 10/25/13 1801 10/25/13 2100 10/26/13 0452  BP: 114/72 137/80 116/90 137/84  Pulse: 81 88 87 87  Temp: 99.5 F (37.5 C) 97.8 F (36.6 C) 98.3 F (36.8 C) 98 F (36.7 C)  TempSrc: Oral Oral Oral Oral  Resp: 20 20 18 18   Height:      Weight:      SpO2: 98% 98% 100% 94%    Intake/Output Summary (Last 24 hours) at 10/26/13 0744 Last data filed at 10/25/13 2223  Gross per 24 hour  Intake    813 ml  Output    150 ml  Net    663 ml   Filed Weights   10/24/13 0656 10/24/13 0928 10/25/13 1000  Weight: 113.7 kg (250 lb 10.6 oz) 122.1 kg (269 lb 2.9 oz) 111.7 kg (246 lb 4.1 oz)    Exam:  General: Alert, awake, oriented x3, in no acute distress.  Heart: Regular rate and rhythm, without murmurs, rubs, gallops.  Lungs: Good air movement, clear to auscultation. Abdomen: Soft, nontender, nondistended, positive bowel sounds.    Data Reviewed: Basic Metabolic Panel:  Recent Labs Lab 10/21/13 2037  10/22/13 0530 10/23/13 1642 10/24/13 0708  NA 133* 136* 140 139  K 6.1* 5.8* 4.9 4.5  CL 89* 90* 94* 95*  CO2 20 21 27 26   GLUCOSE 92 137* 134* 109*  BUN 77* 82* 46* 23  CREATININE 13.75* 14.57* 10.56* 6.95*  CALCIUM 9.6 9.4 8.8 9.8  PHOS  --   --  6.2* 5.0*   Liver Function Tests:  Recent Labs Lab 10/23/13 1642 10/24/13 0708  ALBUMIN 3.4* 3.6   No results found for this basename: LIPASE, AMYLASE,  in the last 168 hours No results found for this basename: AMMONIA,  in the last 168 hours CBC:  Recent Labs Lab 10/21/13 2037 10/22/13 0530 10/23/13 1642 10/24/13 0708  WBC 7.1 6.5 4.6 5.1  NEUTROABS 4.2  --   --   --   HGB 10.0* 8.8* 8.5* 9.3*  HCT 30.1* 25.8* 26.7* 28.7*  MCV 105.2* 102.8* 109.0* 107.1*  PLT 250 230 189 198   Cardiac Enzymes:  Recent Labs Lab 10/22/13 0503 10/22/13 1400  TROPONINI <0.30 <0.30   BNP (last 3 results)  Recent Labs  09/29/13 2359 10/06/13 0334 10/21/13 2037  PROBNP 31055.0* 31834.0* 23932.0*   CBG: No results found for this basename: GLUCAP,  in the last 168 hours  No results found for this  or any previous visit (from the past 240 hour(s)).   Studies: No results found.  Scheduled Meds: . amLODipine  10 mg Oral Daily  . carvedilol  6.25 mg Oral BID WC  . [START ON 10/29/2013] darbepoetin (ARANESP) injection - DIALYSIS  100 mcg Intravenous Q Wed-HD  . FLUoxetine  10 mg Oral Daily  . lamiVUDine  50 mg Oral Daily  . oseltamivir  30 mg Oral Daily  . raltegravir  400 mg Oral BID  . sevelamer carbonate  2,400 mg Oral TID WC  . sodium chloride  3 mL Intravenous Q12H  . sorbitol  15 mL Oral Once  . tenofovir  300 mg Oral Q Fri  . traZODone  50 mg Oral QHS   Continuous Infusions:    Marinda ElkFELIZ ORTIZ, Japheth Diekman  [facility] Pager [phone]. If 8PM-8AM, please contact night-coverage at [email or web address], password [email or web address] 10/26/2013, 7:44 AM  LOS: 5 days

## 2013-10-26 NOTE — Progress Notes (Signed)
Subjective:  Upper chest congestion and cough, still refuses nasal swab to check for flu, wants to stay in "assisted living" after discharge.   Objective: Vital signs in last 24 hours: Temp:  [97.8 F (36.6 C)-99.5 F (37.5 C)] 98 F (36.7 C) (01/18 0452) Pulse Rate:  [81-88] 87 (01/18 0452) Resp:  [18-20] 18 (01/18 0452) BP: (114-137)/(71-90) 137/84 mmHg (01/18 0452) SpO2:  [94 %-100 %] 94 % (01/18 0452) Weight:  [111.7 kg (246 lb 4.1 oz)] 111.7 kg (246 lb 4.1 oz) (01/17 1000) Weight change: -10.4 kg (-22 lb 14.9 oz)  Intake/Output from previous day: 01/17 0701 - 01/18 0700 In: 813 [P.O.:810; I.V.:3] Out: 150 [Urine:150]   Lab Results:  Recent Labs  10/23/13 1642 10/24/13 0708  WBC 4.6 5.1  HGB 8.5* 9.3*  HCT 26.7* 28.7*  PLT 189 198   BMET:  Recent Labs  10/23/13 1642 10/24/13 0708  NA 140 139  K 4.9 4.5  CL 94* 95*  CO2 27 26  GLUCOSE 134* 109*  BUN 46* 23  CREATININE 10.56* 6.95*  CALCIUM 8.8 9.8  ALBUMIN 3.4* 3.6   No results found for this basename: PTH,  in the last 72 hours Iron Studies: No results found for this basename: IRON, TIBC, TRANSFERRIN, FERRITIN,  in the last 72 hours  EXAM:  General appearance: Alert, in no apparent distress  Resp: R clear, still rales L base, occ exp wheezes  Cardio: RRR without murmur or rub  GI: + BS, soft and nontender  Extremities: No edema  Access: AVF @ LFA with + bruit   Dialysis Orders: [facility] MWF 4 hr 180 450/800 EDW 112 2 K 2.5 Ca left AVF Epo 2800 (just increased from 2200 Heparin 1000 no hectorol Recent labs: Hgb 8.6 1/7 tsat 20% 12/17 ferritin 776 11/12iPTH 338 11/22 - not on vitamin D - possibly because ^ Ca but noted on 2.5 Ca bath- previously on hectorol 4 8/22   Assessment/Plan: 1. Dyspnea- initially combination pulm edema + prob infection (flu, bronchitis, PNA), but now below EDW with persistent cough and congestion, more likely flu or PNA, on Tamiflu.  2. ESRD - HD on MWF in [place].  Next HD tomorrow.  3. HTN/Volume - BP 137/84 on Amlodipine 10 mg qd & Carvedilol 6.25 mg bid; wt 111.7 kg, below EDW.  4. Anemia - Hgb up to 9.3, Aranesp 25 mcg on Wed.  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  6. Nutrition - Alb 3.6, renal diet.  7. Depression/suicidal ideation - seen by Psychiatry, started Fluoxetine for depression, Trazodone for insomnia.  8. HIV - on antivirals  9. Dispo - cannot stand or walk due to obesity, deconditioning and knee pain from DJD, SNFP in process.   LOS: 5 days   [name],[name] 10/26/2013,7:12 AM  I have seen and examined patient, discussed with PA and agree with assessment and plan as outlined above. Vinson Moselleob Jean Sizelove MD pager [phone]    cell [phone] 10/26/2013, 11:51 AM

## 2013-10-27 DIAGNOSIS — D649 Anemia, unspecified: Secondary | ICD-10-CM

## 2013-10-27 LAB — CBC
HEMATOCRIT: 27.1 % — AB (ref 36.0–46.0)
Hemoglobin: 9.2 g/dL — ABNORMAL LOW (ref 12.0–15.0)
MCH: 35 pg — ABNORMAL HIGH (ref 26.0–34.0)
MCHC: 33.9 g/dL (ref 30.0–36.0)
MCV: 103 fL — AB (ref 78.0–100.0)
Platelets: 237 10*3/uL (ref 150–400)
RBC: 2.63 MIL/uL — AB (ref 3.87–5.11)
RDW: 13.6 % (ref 11.5–15.5)
WBC: 7.3 10*3/uL (ref 4.0–10.5)

## 2013-10-27 LAB — RENAL FUNCTION PANEL
Albumin: 3.4 g/dL — ABNORMAL LOW (ref 3.5–5.2)
BUN: 67 mg/dL — AB (ref 6–23)
CHLORIDE: 88 meq/L — AB (ref 96–112)
CO2: 24 meq/L (ref 19–32)
Calcium: 8.8 mg/dL (ref 8.4–10.5)
Creatinine, Ser: 11.74 mg/dL — ABNORMAL HIGH (ref 0.50–1.10)
GFR calc non Af Amer: 3 mL/min — ABNORMAL LOW (ref >90)
GFR, EST AFRICAN AMERICAN: 4 mL/min — AB (ref >90)
GLUCOSE: 86 mg/dL (ref 70–99)
Phosphorus: 6 mg/dL — ABNORMAL HIGH (ref 2.3–4.6)
Potassium: 5.4 mEq/L — ABNORMAL HIGH (ref 3.7–5.3)
SODIUM: 134 meq/L — AB (ref 137–147)

## 2013-10-27 LAB — CD4/CD8 (T-HELPER/T-SUPPRESSOR CELL)
CD4 absolute: 450 /uL — ABNORMAL LOW (ref 500–1900)
CD4%: 34 % (ref 30.0–60.0)
CD8 T CELL ABS: 520 /uL (ref 230–1000)
CD8TOX: 39 % (ref 15.0–40.0)
Ratio: 0.87 — ABNORMAL LOW (ref 1.0–3.0)
Total lymphocyte count: 1330 /uL (ref 1000–4000)

## 2013-10-27 LAB — HIV-1 RNA QUANT-NO REFLEX-BLD
HIV 1 RNA Quant: <20 {copies}/mL
HIV-1 RNA Quant, Log: <1.3 {Log}

## 2013-10-27 LAB — HEPATITIS B SURFACE ANTIGEN: Hepatitis B Surface Ag: NEGATIVE

## 2013-10-27 MED ORDER — HEPARIN SODIUM (PORCINE) 1000 UNIT/ML DIALYSIS
1000.0000 [IU] | Freq: Once | INTRAMUSCULAR | Status: AC
  Administered 2013-10-27: 1000 [IU] via INTRAVENOUS_CENTRAL

## 2013-10-27 MED ORDER — LIDOCAINE-PRILOCAINE 2.5-2.5 % EX CREA: 1.0000 "application " | TOPICAL_CREAM | CUTANEOUS | Status: DC | PRN

## 2013-10-27 MED ORDER — SODIUM CHLORIDE 0.9 % IV SOLN
100.0000 mL | INTRAVENOUS | Status: DC | PRN
Start: 2013-10-27 — End: 2013-10-27

## 2013-10-27 MED ORDER — NEPRO/CARBSTEADY PO LIQD
237.0000 mL | ORAL | Status: DC | PRN
Start: 2013-10-27 — End: 2013-10-27

## 2013-10-27 MED ORDER — LIDOCAINE HCL (PF) 1 % IJ SOLN: 5.0000 mL | INTRAMUSCULAR | Status: DC | PRN

## 2013-10-27 MED ORDER — RENA-VITE PO TABS
1.0000 | ORAL_TABLET | Freq: Every day | ORAL | Status: DC
  Administered 2013-10-27: 1 via ORAL
  Filled 2013-10-27 (×2): qty 1

## 2013-10-27 MED ORDER — DIPHENHYDRAMINE HCL 50 MG PO CAPS
50.0000 mg | ORAL_CAPSULE | Freq: Every day | ORAL | Status: DC
  Administered 2013-10-27: 50 mg via ORAL
  Filled 2013-10-27 (×2): qty 1
  Filled 2013-10-27: qty 2

## 2013-10-27 MED ORDER — ACETAMINOPHEN 325 MG PO TABS
ORAL_TABLET | ORAL | Status: AC
  Administered 2013-10-27: 22:00:00 650 mg via ORAL
  Filled 2013-10-27: qty 2

## 2013-10-27 MED ORDER — HEPARIN SODIUM (PORCINE) 1000 UNIT/ML IJ SOLN: 1000.0000 [IU] | Freq: Once | INTRAMUSCULAR | Status: DC

## 2013-10-27 MED ORDER — HYDROXYZINE HCL 25 MG PO TABS
25.0000 mg | ORAL_TABLET | Freq: Three times a day (TID) | ORAL | Status: DC
Start: 2013-10-27 — End: 2013-10-28
  Administered 2013-10-27 – 2013-10-28 (×3): 25 mg via ORAL
  Filled 2013-10-27 (×5): qty 1

## 2013-10-27 MED ORDER — TRAZODONE HCL 100 MG PO TABS
100.0000 mg | ORAL_TABLET | Freq: Every day | ORAL | Status: DC
  Filled 2013-10-27: qty 1

## 2013-10-27 MED ORDER — SODIUM CHLORIDE 0.9 % IV SOLN: 100.0000 mL | INTRAVENOUS | Status: DC | PRN

## 2013-10-27 MED ORDER — AMLODIPINE BESYLATE 5 MG PO TABS
5.0000 mg | ORAL_TABLET | Freq: Every day | ORAL | Status: DC
  Administered 2013-10-27: 5 mg via ORAL
  Filled 2013-10-27 (×2): qty 1

## 2013-10-27 MED ORDER — HEPARIN SODIUM (PORCINE) 1000 UNIT/ML DIALYSIS: 1000.0000 [IU] | INTRAMUSCULAR | Status: DC | PRN

## 2013-10-27 MED ORDER — ALTEPLASE 2 MG IJ SOLR: 2.0000 mg | Freq: Once | INTRAMUSCULAR | Status: DC | PRN

## 2013-10-27 MED ORDER — FLUOXETINE HCL 20 MG PO CAPS
20.0000 mg | ORAL_CAPSULE | Freq: Every day | ORAL | Status: DC
  Administered 2013-10-28: 20 mg via ORAL
  Filled 2013-10-27: qty 1

## 2013-10-27 MED ORDER — PENTAFLUOROPROP-TETRAFLUOROETH EX AERO: 1.0000 "application " | INHALATION_SPRAY | CUTANEOUS | Status: DC | PRN

## 2013-10-27 NOTE — Progress Notes (Signed)
Addendum:    Psych follow up appreciated. Following Psych evaluation, patient complained that she cannot take Trazodone because it makes her nauseas and have bad dreams. Reviewed records and she has refused it for last 4 nights. Discussed with Dr. Elsie SaasJonnalagadda who recommended DC Trazodone and start Benadryl 50 mg orally at bedtime for insomnia.  Marcellus ScottHONGALGI,Reef Achterberg, MD, FACP, FHM. [facility] Pager [phone]  If 7PM-7AM, please contact night-coverage [email or web address] Password [email or web address] 10/27/2013, 5:28 PM

## 2013-10-27 NOTE — Progress Notes (Signed)
[facility] PROGRESS NOTE  Brief summary 63 year old female with history of ESRD, HTN, HIV, substance (crack) abuse, dialyzed at [facility] city, presented with dyspnea and history of missing dialysis.  Assessment/Plan:  Dyspnea due Acute on chronic diastolic heart failure/fluid overload from ESRD and missed dialysis - Multifactorial vol excess/mild pulm edema + prob infection ( influenza), HD an still SOB. - Avoid opiates. - Patient did not have high fevers. She had some dry cough. She declined influenza panel PCR. Empiric Tamiflu had been started but no clear indication that she had severe flulike illness. Will DC Tamiflu. - Compensated/stable.   ESRD - HD on MWF in [facility] - Nephrology consulted and under went inpatient hemodialysis-last 1/19 - [place] EDW.  HTN: - BP stable on Amlodipine 10 mg qd & Carvedilol 6.25 mg bid;   Anemia  - Hgb up to 9.3, Aranesp 25 mcg on Wed. stable  Depression/suicidal ideation  - seen by Psychiatry, started Fluoxetine for depression, Trazodone for insomnia.  - Patient still has the Recruitment consultant. Requested psychiatry to evaluate on 1/19. Patient denies suicidal or homicidal ideations.  HIV  - on antivirals  History of substance abuse    Code Status: Full  Family Communication: None at bedside  Disposition Plan: Possible DC to SNF 1/20    Consultants:  Renal  Psychiatry  Procedures:  HD  Antibiotics:  none (indicate start date, and stop date if known)  HPI/Subjective: Denies complaints. Denies dyspnea, suicidal or homicidal ideations. No acute events per nursing.  Objective: Filed Vitals:   10/26/13 1353 10/26/13 1520 10/26/13 2200 10/27/13 1125  BP: 125/68 132/69 137/79   Pulse: 78 80 91   Temp: 98 F (36.7 C) 98.3 F (36.8 C) 99 F (37.2 C) 97.9 F (36.6 C)  TempSrc: Oral Oral Oral   Resp: 16 20 20    Height:      Weight:      SpO2: 94% 97% 95%     Intake/Output Summary (Last 24 hours) at 10/27/13  1134 Last data filed at 10/26/13 2228  Gross per 24 hour  Intake    243 ml  Output      0 ml  Net    243 ml   Filed Weights   10/24/13 0656 10/24/13 0928 10/25/13 1000  Weight: 113.7 kg (250 lb 10.6 oz) 122.1 kg (269 lb 2.9 oz) 111.7 kg (246 lb 4.1 oz)    Exam:  General: Alert, awake, oriented x3, in no acute distress.  Heart: Regular rate and rhythm, without murmurs, rubs, gallops.  Lungs: Good air movement, clear to auscultation. Abdomen: Soft, nontender, nondistended, positive bowel sounds.  CNS: Alert and oriented. No focal deficits Psychiatric: Pleasant and appropriate.   Data Reviewed: Basic Metabolic Panel:  Recent Labs Lab 10/21/13 2037 10/22/13 0530 10/23/13 1642 10/24/13 0708  NA 133* 136* 140 139  K 6.1* 5.8* 4.9 4.5  CL 89* 90* 94* 95*  CO2 20 21 27 26   GLUCOSE 92 137* 134* 109*  BUN 77* 82* 46* 23  CREATININE 13.75* 14.57* 10.56* 6.95*  CALCIUM 9.6 9.4 8.8 9.8  PHOS  --   --  6.2* 5.0*   Liver Function Tests:  Recent Labs Lab 10/23/13 1642 10/24/13 0708  ALBUMIN 3.4* 3.6   No results found for this basename: LIPASE, AMYLASE,  in the last 168 hours No results found for this basename: AMMONIA,  in the last 168 hours CBC:  Recent Labs Lab 10/21/13 2037 10/22/13 0530 10/23/13 1642 10/24/13 0708  WBC  7.1 6.5 4.6 5.1  NEUTROABS 4.2  --   --   --   HGB 10.0* 8.8* 8.5* 9.3*  HCT 30.1* 25.8* 26.7* 28.7*  MCV 105.2* 102.8* 109.0* 107.1*  PLT 250 230 189 198   Cardiac Enzymes:  Recent Labs Lab 10/22/13 0503 10/22/13 1400  TROPONINI <0.30 <0.30   BNP (last 3 results)  Recent Labs  09/29/13 2359 10/06/13 0334 10/21/13 2037  PROBNP 31055.0* 31834.0* 23932.0*   CBG: No results found for this basename: GLUCAP,  in the last 168 hours  No results found for this or any previous visit (from the past 240 hour(s)).   Studies: No results found.  Scheduled Meds: . amLODipine  5 mg Oral QHS  . carvedilol  6.25 mg Oral BID WC  . [START  ON 10/29/2013] darbepoetin (ARANESP) injection - DIALYSIS  100 mcg Intravenous Q Wed-HD  . FLUoxetine  10 mg Oral Daily  . lamiVUDine  50 mg Oral Daily  . multivitamin  1 tablet Oral QHS  . oseltamivir  30 mg Oral Daily  . raltegravir  400 mg Oral BID  . sevelamer carbonate  2,400 mg Oral TID WC  . sodium chloride  3 mL Intravenous Q12H  . sorbitol  15 mL Oral Once  . tenofovir  300 mg Oral Q Fri  . traZODone  50 mg Oral QHS   Continuous Infusions:    Huntington Va Medical CenterNGALGI,[name]  [facility] Pager [phone]. If 8PM-8AM, please contact night-coverage at [email or web address], password [email or web address] 10/27/2013, 11:34 AM  LOS: 6 days

## 2013-10-27 NOTE — Procedures (Signed)
I was present at this session.  I have reviewed the session itself and made appropriate changes.  Bp ok.  tol vol off. Access press ok.  Ariell Gunnels L 1/19/201512:36 PM

## 2013-10-27 NOTE — Progress Notes (Signed)
Subjective: Interval History: has complaints hungry.  Objective: Vital signs in last 24 hours: Temp:  [98 F (36.7 C)-99 F (37.2 C)] 99 F (37.2 C) (01/18 2200) Pulse Rate:  [72-91] 91 (01/18 2200) Resp:  [16-20] 20 (01/18 2200) BP: (117-137)/(58-79) 137/79 mmHg (01/18 2200) SpO2:  [94 %-97 %] 95 % (01/18 2200) Weight change:   Intake/Output from previous day: 01/18 0701 - 01/19 0700 In: 363 [P.O.:360; I.V.:3] Out: -  Intake/Output this shift:    General appearance: cooperative and moderately obese Resp: diminished breath sounds bilaterally and rales bibasilar Cardio: S1, S2 normal and systolic murmur: holosystolic 2/6, blowing at apex GI: obese, pos bs, liver down 6 cm Extremities: edema 2+ and AVF LLA,   Lab Results: No results found for this basename: WBC, HGB, HCT, PLT,  in the last 72 hours BMET: No results found for this basename: NA, K, CL, CO2, GLUCOSE, BUN, CREATININE, CALCIUM,  in the last 72 hours No results found for this basename: PTH,  in the last 72 hours Iron Studies: No results found for this basename: IRON, TIBC, TRANSFERRIN, FERRITIN,  in the last 72 hours  Studies/Results: No results found.  I have reviewed the patient's current medications.  Assessment/Plan: 1 ESRD vol xs. ,lower. Needs less BP meds 2 HTN lower med and vol 3 Resp vol and resp 4 HIV 5Anemia epo P HD, lower meds. Lower vol ,correct med needs    LOS: 6 days   Noela Brothers L 10/27/2013,7:48 AM

## 2013-10-27 NOTE — Consult Note (Signed)
Reason for Consult: Depression and suicidal ideation Referring Physician: Geradine Girt, DO  [name] is an 63 y.o. female. This is a psychiatric consultation follow up note. Patient stated that she is feeling much better regarding depression but continues to feel inadequate sleep and anxiety, she stated that she has plan of going to rehab for at least three weeks after discharged. Patient has been noncompliant with medication treatment and has decreased symptoms of her depression, psychomotor retardation, loss of interest and no suicidal ideation. Patient reported she was living with her niece and the environment is not good because of drugs and also she feels nobody cares for her. Patient has denies current suicide ideation, intention or plan. Patient has no evidence of psychotic symptoms. Patient endorses using cocaine in the past.  Mental Status Examination: Patient appeared as per his stated age, and has fair eye contact. Patient has fine mood and flat affect. She has normal rate, rhythm, and volume of speech. Her thought process is linear and goal directed. Patient has denied suicidal, homicidal ideations, intentions or plans. Patient has no evidence of auditory or visual hallucinations, delusions, and paranoia. Patient has fair insight judgment and impulse control.  Past Medical History  Diagnosis Date  . Hypertension   . HIV infection   . Chronic renal failure   . CHF (congestive heart failure)   . ESRD (end stage renal disease) on dialysis 09/30/2013    On dialysis at [facility], per [facility] physicians.  Started dialysis in 2008.  Takes HD MWF schedule.      Past Surgical History  Procedure Laterality Date  . Arteriovenous graft placement      History reviewed. No pertinent family history.  Social History:  reports that she has been smoking.  She does not have any smokeless tobacco history on file. She reports that she drinks alcohol. She reports that she uses illicit drugs  (Marijuana) about once per week.  Allergies:  Allergies  Allergen Reactions  . Morphine And Related Hives and Rash    Medications: I have reviewed the patient's current medications.  Results for orders placed during the hospital encounter of 10/21/13 (from the past 48 hour(s))  RENAL FUNCTION PANEL     Status: Abnormal   Collection Time    10/27/13  8:00 AM      Result Value Range   Sodium 134 (*) 137 - 147 mEq/L   Potassium 5.4 (*) 3.7 - 5.3 mEq/L   Chloride 88 (*) 96 - 112 mEq/L   CO2 24  19 - 32 mEq/L   Glucose, Bld 86  70 - 99 mg/dL   BUN 67 (*) 6 - 23 mg/dL   Creatinine, Ser 11.74 (*) 0.50 - 1.10 mg/dL   Calcium 8.8  8.4 - 10.5 mg/dL   Phosphorus 6.0 (*) 2.3 - 4.6 mg/dL   Albumin 3.4 (*) 3.5 - 5.2 g/dL   GFR calc non Af Amer 3 (*) >90 mL/min   GFR calc Af Amer 4 (*) >90 mL/min   Comment: (NOTE)     The eGFR has been calculated using the CKD EPI equation.     This calculation has not been validated in all clinical situations.     eGFR's persistently <90 mL/min signify possible Chronic Kidney     Disease.  CBC     Status: Abnormal   Collection Time    10/27/13  8:00 AM      Result Value Range   WBC 7.3  4.0 -  10.5 K/uL   RBC 2.63 (*) 3.87 - 5.11 MIL/uL   Hemoglobin 9.2 (*) 12.0 - 15.0 g/dL   HCT 27.1 (*) 36.0 - 46.0 %   MCV 103.0 (*) 78.0 - 100.0 fL   MCH 35.0 (*) 26.0 - 34.0 pg   MCHC 33.9  30.0 - 36.0 g/dL   RDW 13.6  11.5 - 15.5 %   Platelets 237  150 - 400 K/uL    No results found.  Positive for anxiety, bad mood, behavior problems, illegal drug usage and sleep disturbance Blood pressure 123/85, pulse 92, temperature 98.5 F (36.9 C), temperature source Oral, resp. rate 18, height _0  (1.626 m), weight 114.7 kg (252 lb 13.9 oz), SpO2 98.00%.   Assessment/Plan: Major depressive disorder recurrent Cocaine abuse End-stage renal disease  Recommendation: Discontinue one-to-one observation as she does not have any safety issues Increase Fluoxetine 20 mg  daily for depression Increase Trazodone 100 mg at bedtime for insomnia  Start Vistaril 25 mg PO TID for anxiety and itching Recommend social service evaluation regarding out-of-home placement Appreciate psychiatric consultation May call [phone] if needed further assistance.  [name],JANARDHAHA R. 10/27/2013, 4:38 PM

## 2013-10-27 NOTE — Progress Notes (Signed)
New medication orders explained to pt. Pt became upset and said that she is allergic to Trazodone and will not take it (it gives her bad dreams and causes nausea). Pt is requesting something else to help her sleep. Attempted to get a hold of Dr. Elsie SaasJonnalagadda, but no answer at [phone]. MD Hongalgi notified. Orders received.

## 2013-10-27 NOTE — Progress Notes (Signed)
PT Cancellation Note:  Pt checked on several times and in HD each time. Will check back if time allows, otherwise, will follow tomorrow.  Lyanne CoVictoria Purity Irmen, PT

## 2013-10-27 NOTE — Progress Notes (Signed)
Patient is requesting to eat breakfast before she goes to [facility]. Explained to her that the cafe is not delivering this early in the morning and that she would not be able to eat while in [facility]. She then said that [facility]. [facility] came to pick up the patient and she told them as well that she was not going to [facility]. Will notify the oncoming RN of the situation.  PACCAR Incyanne Hill BSN, RN  [facility] [place] [phone]

## 2013-10-27 NOTE — ED Provider Notes (Signed)
Medical screening examination/treatment/procedure(s) were conducted as a shared visit with non-physician practitioner(s) and myself.  I personally evaluated the patient during the encounter.  EKG Interpretation    Date/Time:  Tuesday October 21 2013 20:15:39 EST Ventricular Rate:  97 PR Interval:  199 QRS Duration: 109 QT Interval:  365 QTC Calculation: 464 R Axis:   63 Text Interpretation:  Sinus rhythm Minimal ST elevation, inferior leads No significant change since last tracing 09/29/13 Confirmed by Juleen ChinaKOHUT  MD, Kilie Rund ([id number]) on 10/21/2013 8:32:33 PM           62yF with dyspnea. Likely 2/2 fluid overload. ESRD and missed dialysis. Cites transportation issues. Hypoxic and hyperkalemic w/o EKG changes. ADmit.    Raeford RazorStephen Lino Wickliff, MD 10/27/13 2242

## 2013-10-28 MED ORDER — CARVEDILOL 3.125 MG PO TABS: 3.1250 mg | ORAL_TABLET | Freq: Two times a day (BID) | ORAL | Status: DC

## 2013-10-28 MED ORDER — FLUOXETINE HCL 20 MG PO CAPS
20.0000 mg | ORAL_CAPSULE | Freq: Every day | ORAL | Status: DC
Start: 2013-10-28 — End: 2013-10-28

## 2013-10-28 MED ORDER — LAMIVUDINE 10 MG/ML PO SOLN
50.0000 mg | Freq: Every day | ORAL | Status: DC
Start: 2013-10-28 — End: 2013-10-28

## 2013-10-28 MED ORDER — HYDROXYZINE HCL 25 MG PO TABS: 25.0000 mg | ORAL_TABLET | Freq: Three times a day (TID) | ORAL | Status: DC

## 2013-10-28 MED ORDER — AMLODIPINE BESYLATE 5 MG PO TABS: 5.0000 mg | ORAL_TABLET | Freq: Every day | ORAL | Status: DC

## 2013-10-28 MED ORDER — CARVEDILOL 3.125 MG PO TABS
3.1250 mg | ORAL_TABLET | Freq: Two times a day (BID) | ORAL | Status: DC
  Administered 2013-10-28: 3.125 mg via ORAL
  Filled 2013-10-28 (×3): qty 1

## 2013-10-28 MED ORDER — RENA-VITE PO TABS: 1.0000 | ORAL_TABLET | Freq: Every day | ORAL | Status: DC

## 2013-10-28 MED ORDER — LAMIVUDINE 10 MG/ML PO SOLN: 50.0000 mg | Freq: Every day | ORAL | Status: DC

## 2013-10-28 MED ORDER — DIPHENHYDRAMINE HCL 50 MG PO CAPS: 50.0000 mg | ORAL_CAPSULE | Freq: Every day | ORAL | Status: DC

## 2013-10-28 MED ORDER — FLUOXETINE HCL 20 MG PO CAPS: 20.0000 mg | ORAL_CAPSULE | Freq: Every day | ORAL | Status: DC

## 2013-10-28 NOTE — Progress Notes (Signed)
Patient upset all day, prior to being d/c'd via ambulance,to nursing home [facility] Patient stated she had brown pocketbook and white cell phone charger upon admission but that it was taken from her due to her being on suicide precautions. No mention of these in admission history but articles were seen by RN, Ty. Attempted to call family members of patient but were unable to reach anyone except son. Explained situation to him.  Patient continued to refuse placement on/off, called  911 several times (officer came to floor and spoke with patient). Patient verbally abusive and throwing articles in room one minute, but later apologetic for behavior. Patient placed self in personal motorized wheelchair and left floor for brief period then returned and waited for ambulance transport. Patient d/c'd and motorized wheelchair and walker placed in conference room with label affixed, to be picked [place] by son (in [place]).

## 2013-10-28 NOTE — Progress Notes (Signed)
Physical Therapy Treatment Patient Details Name: [name] MRN: [id number] DOB: 1951-01-21 Today's Date: 10/28/2013 Time: [id number] PT Time Calculation (min): 13 min  PT Assessment / Plan / Recommendation  History of Present Illness Patient is a 63 y/o female admitted with fluid overload due to missing HD since 1/9.   PT Comments   Pt making steady progress with mobility.  Follow Up Recommendations  SNF     Equipment Recommendations  None recommended by PT    Frequency Min 3X/week   Progress towards PT Goals Progress towards PT goals: Progressing toward goals  Plan Current plan remains appropriate    Precautions / Restrictions Precautions Precautions: Fall Precaution Comments: Droplet Restrictions Weight Bearing Restrictions: No    Mobility  Bed Mobility Overal bed mobility: Needs Assistance Bed Mobility: Supine to Sit Supine to sit: Min assist;HOB elevated General bed mobility comments: assist with cues to transition trunk into seated position on edge of bed with rail. Transfers Overall transfer level: Needs assistance Equipment used: Rolling walker (2 wheeled) Transfers: Sit to/from Stand Sit to Stand: Min assist General transfer comment: assist with cues for ant weight shift to get to standing and to use arms to control descent with sitting down. Ambulation/Gait Ambulation/Gait assistance: Min assist Ambulation Distance (Feet): 30 Feet Assistive device: Rolling walker (2 wheeled) Gait Pattern/deviations: Step-through pattern;Decreased stride length;Wide base of support;Shuffle Gait velocity: decreased Gait velocity interpretation: Below normal speed for age/gender      PT Goals (current goals can now be found in the care plan section) Acute Rehab PT Goals Patient Stated Goal: Not to go home PT Goal Formulation: With patient Time For Goal Achievement: 11/07/13 Potential to Achieve Goals: Good  Visit Information  Last PT Received On:  10/28/13 Assistance Needed: +1 History of Present Illness: Patient is a 63 y/o female admitted with fluid overload due to missing HD since 1/9.    Subjective Data  Patient Stated Goal: Not to go home   Cognition  Cognition Arousal/Alertness: Awake/alert Behavior During Therapy: WFL for tasks assessed/performed Overall Cognitive Status: Within Functional Limits for tasks assessed    End of Session PT - End of Session Equipment Utilized During Treatment: Gait belt Activity Tolerance: Patient tolerated treatment well Patient left: in chair;with call bell/phone within reach Nurse Communication: Mobility status   GP     [name], [name] 10/28/2013, 4:46 PM  Jean KusterKathy Jerrell [name], PTA Office- [phone]

## 2013-10-28 NOTE — Progress Notes (Signed)
Refusing to wear telemetry. States, "This is bothering me. I'm going home tomorrow. I'm not wearing it anymore. You can tell the doctor I said so." Notified [facility] on call. Will continue to monitor.

## 2013-10-28 NOTE — Discharge Summary (Signed)
Physician Discharge Summary  [name] ZOX:[id number] DOB: [birth year] DOA: 10/21/2013  PCP: No PCP Per Patient  Admit date: 10/21/2013 Discharge date: 10/28/2013  Time spent: Greater than 30 minutes  Recommendations for Outpatient Follow-up:  1. M.D. at SNF in 3 days 2. Hemodialysis Center: Keep up regular hemodialysis appointments for Mondays, Wednesdays and Fridays. Next hemodialysis on 10/29/13. Patient labs (CBC & renal panel) can be monitored during dialysis visits. 3. Dr. Cliffton Asters, Infectious disease: Regarding followup and management of HIV infection. Discussed with Dr. Judyann Munson who will arrange for outpatient followup. 4. Recommend outpatient followup with Psychiatry 5. Recommend outpatient Palliative care consultation for goals of care.  Discharge Diagnoses:  Principal Problem:   Pulmonary edema, acute Active Problems:   HIV infection   Hypertension   Hyperkalemia   ESRD (end stage renal disease) on dialysis   Chest pain   SOB (shortness of breath)   Discharge Condition: Improved & Stable  Diet recommendation: Heart healthy diet  Filed Weights   10/27/13 1125 10/27/13 1500 10/27/13 2253  Weight: 117 kg (257 lb 15 oz) 114.7 kg (252 lb 13.9 oz) 113.853 kg (251 lb)    History of present illness:  63 year old female with history of ESRD, HTN, HIV, substance (crack) abuse, dialyzed at [place] [facility], presented with dyspnea and history of missing dialysis.  Hospital Course:   Dyspnea due to Acute on chronic diastolic heart failure/fluid overload from ESRD and missed dialysis  - Likely secondary to volume excess from missed hemodialysis. As per nephrology, patient frequently signs of prior to completing adequate hemodialysis treatments. They and this M.D. have discussed this extensively with patient and the risks of worsening overall condition and associated comorbidities. She states that she would try and be compliant with her hemodialysis treatments.  Discussed with Dr. Darrick Penna who recommends outpatient palliative care consultation for goals of care - Patient did not have high fevers. She had some dry cough. She declined influenza panel PCR. Empiric Tamiflu had been started but no clear indication that she had severe flulike illness. DC Tamiflu.  - Dyspnea resolved. - Counseled regarding tobacco cessation.  ESRD  - HD on MWF in [facility]  - Nephrology consulted and under went inpatient hemodialysis-last 1/19  - Lower EDW.  - Patient has mild hyperkalemia but this BMP sample was drawn prior to dialysis on 1/19. Discussed with Dr. Darrick Penna who recommends no further management of same and continue outpatient hemodialysis on 1/21.  HTN:  - Due to lowering of her dry weight across dialysis, nephrology has reduced her antihypertensive medication doses by half. Monitor.  Anemia  - Hgb up to 9.3, Aranesp 25 mcg on Wed. stable   Major depressive disorder recurrent/suicidal ideation  - seen by Psychiatry, started Fluoxetine for depression, Trazodone for insomnia. Psychiatry reevaluated patient on 1/19 and discontinued safety sitter, increased her fluoxetine to 20 mg daily for depression. She had been started on trazodone but stated that it was making her nauseous and have bad dreams. Psychiatry recommended discontinuing trazodone and start Benadryl 50 mg by mouth each bedtime for insomnia. He also recommended Vistaril 25 mg by mouth 3 times a day for anxiety and itching. She denies any suicidal or homicidal ideations.  HIV  - on antivirals. Lamivudine dose has been reduced to adjust for renal functions. Not sure if she is compliant with medications or infectious disease followup. Counseled regarding compliance.  History of substance abuse (crack cocaine & tobacco) - Cessation counseled. Noticed 2 brand-new packets of cigarettes  lying by her bedside.   Consultations:  Nephrology  Psychiatry  Procedures:  Hemodialysis: Last on 1/19     Discharge Exam:  Complaints:  Denies complaints. Denies dyspnea, cough, chest pain, suicidal or homicidal ideations. Anxious to be discharged.  Filed Vitals:   10/27/13 1758 10/27/13 2253 10/28/13 0540 10/28/13 0750  BP: 115/80 122/73 131/83   Pulse: 92 90 80   Temp: 98.2 F (36.8 C) 99.3 F (37.4 C) 98.6 F (37 C)   TempSrc: Oral Oral Oral   Resp: 18 18 18 22   Height:      Weight:  113.853 kg (251 lb)    SpO2: 98% 92% 100% 95%    General: Alert, awake, oriented x3, in no acute distress.  Heart: Regular rate and rhythm, without murmurs, rubs, gallops.  Lungs: Good air movement, clear to auscultation.  Abdomen: Soft, nontender, nondistended, positive bowel sounds.  CNS: Alert and oriented. No focal deficits  Psychiatric: Pleasant and appropriate   Discharge Instructions      Discharge Orders   Future Orders Complete By Expires   (HEART FAILURE PATIENTS) Call MD:  Anytime you have any of the following symptoms: 1) 3 pound weight gain in 24 hours or 5 pounds in 1 week 2) shortness of breath, with or without a dry hacking cough 3) swelling in the hands, feet or stomach 4) if you have to sleep on extra pillows at night in order to breathe.  As directed    Call MD for:  difficulty breathing, headache or visual disturbances  As directed    Call MD for:  extreme fatigue  As directed    Call MD for:  persistant dizziness or light-headedness  As directed    Call MD for:  severe uncontrolled pain  As directed    Call MD for:  temperature >100.4  As directed    Diet - low sodium heart healthy  As directed    Increase activity slowly  As directed        Medication List    STOP taking these medications       amitriptyline 10 MG tablet  Commonly known as:  ELAVIL     lamiVUDine 150 MG tablet  Commonly known as:  EPIVIR  Replaced by:  lamiVUDine 10 MG/ML solution      TAKE these medications       amLODipine 5 MG tablet  Commonly known as:  NORVASC  Take 1 tablet (5  mg total) by mouth at bedtime.     carvedilol 3.125 MG tablet  Commonly known as:  COREG  Take 1 tablet (3.125 mg total) by mouth 2 (two) times daily with a meal.     cinacalcet 60 MG tablet  Commonly known as:  SENSIPAR  Take 60 mg by mouth daily.     diphenhydrAMINE 50 MG capsule  Commonly known as:  BENADRYL  Take 1 capsule (50 mg total) by mouth at bedtime.     FLUoxetine 20 MG capsule  Commonly known as:  PROZAC  Take 1 capsule (20 mg total) by mouth daily.     hydrOXYzine 25 MG tablet  Commonly known as:  ATARAX/VISTARIL  Take 1 tablet (25 mg total) by mouth 3 (three) times daily.     lamiVUDine 10 MG/ML solution  Commonly known as:  EPIVIR  Take 5 mLs (50 mg total) by mouth daily.     multivitamin Tabs tablet  Take 1 tablet by mouth at bedtime.     omeprazole  20 MG capsule  Commonly known as:  PRILOSEC  Take 20 mg by mouth daily.     raltegravir 400 MG tablet  Commonly known as:  ISENTRESS  Take 400 mg by mouth 2 (two) times daily.     sevelamer carbonate 800 MG tablet  Commonly known as:  RENVELA  Take 1,600-2,400 mg by mouth 5 (five) times daily. 3 Tabs with meals and 2 tabs with snacks     tenofovir 300 MG tablet  Commonly known as:  VIREAD  Take 300 mg by mouth every Friday. Takes on Fridays.       Follow-up Information   Follow up with MD at SNF. Schedule an appointment as soon as possible for a visit in 3 days.      Follow up with Hemodialysis Center. (Keep up regular dialysis appointments on Mon/Wed/Fri)       Schedule an appointment as soon as possible for a visit with Cliffton AstersJohn Campbell, MD.   Specialty:  Infectious Diseases   Contact information:   [address] [place] [address] [phone]        The results of significant diagnostics from this hospitalization (including imaging, microbiology, ancillary and laboratory) are listed below for reference.    Significant Diagnostic Studies: Dg Chest 2 View  10/21/2013    CLINICAL DATA:  Cough. Shortness of breath. chest pain. Current history of HIV, end-stage renal disease, and hypertension.  EXAM: CHEST  2 VIEW  COMPARISON:  Portable examinations 10/06/2013, 05/25/2013. Two-view chest x-ray 09/29/2013, 07/31/2013.  FINDINGS: Cardiac silhouette mildly enlarged but stable. Mild diffuse interstitial pulmonary edema, best visualized on the lateral image. No confluent airspace consolidation. Linear atelectasis in the lower lobes and the inferior upper lobes bilaterally. No pleural effusions. Degenerative changes involving the thoracic spine.  IMPRESSION: Mild CHF, with mild cardiomegaly and mild diffuse interstitial pulmonary edema.   Electronically Signed   By: Hulan Saashomas  Lawrence M.D.   On: 10/21/2013 21:38   Dg Chest 2 View  09/30/2013   CLINICAL DATA:  Short of breath.  Congestion.  Dizziness.  EXAM: CHEST  2 VIEW  COMPARISON:  07/31/2013.  FINDINGS: Cardiopericardial silhouette is mildly enlarged. Lung volumes are low. Tortuous thoracic aorta with atherosclerosis. Scattered areas of subsegmental atelectasis. There is new pulmonary vascular congestion. Interstitial opacity is present compatible with edema.  IMPRESSION: Constellation of findings compatible with mild CHF.   Electronically Signed   By: Andreas NewportGeoffrey  Lamke M.D.   On: 09/30/2013 00:00   Dg Chest Portable 1 View  10/06/2013   CLINICAL DATA:  Chest pain, shortness of breath, hypertension, HIV.  EXAM: PORTABLE CHEST - 1 VIEW  COMPARISON:  Chest radiograph September 29, 2013  FINDINGS: Cardiac silhouette remains upper limits of normal. Mildly calcified aortic knob. Similar central pulmonary vasculature congestion, mild interstitial prominence. Unchanged bandlike density in left midlung zone. No pleural effusions or focal consolidations. No pneumothorax.  Soft tissue planes and included osseous structures are nonsuspicious. Multiple EKG lines overlie the patient and may obscure subtle underlying pathology.  IMPRESSION: Stable  appearance of chest: Borderline cardiomegaly with apparent mild pulmonary edema. Left midlung zone atelectasis versus scarring.   Electronically Signed   By: Awilda Metroourtnay  Bloomer   On: 10/06/2013 04:03    Microbiology: No results found for this or any previous visit (from the past 240 hour(s)).   Labs: Basic Metabolic Panel:  Recent Labs Lab 10/21/13 2037 10/22/13 0530 10/23/13 1642 10/24/13 0708 10/27/13 0800  NA 133* 136* 140 139 134*  K 6.1* 5.8* 4.9 4.5 5.4*  CL 89* 90* 94* 95* 88*  CO2 20 21 27 26 24   GLUCOSE 92 137* 134* 109* 86  BUN 77* 82* 46* 23 67*  CREATININE 13.75* 14.57* 10.56* 6.95* 11.74*  CALCIUM 9.6 9.4 8.8 9.8 8.8  PHOS  --   --  6.2* 5.0* 6.0*   Liver Function Tests:  Recent Labs Lab 10/23/13 1642 10/24/13 0708 10/27/13 0800  ALBUMIN 3.4* 3.6 3.4*   No results found for this basename: LIPASE, AMYLASE,  in the last 168 hours No results found for this basename: AMMONIA,  in the last 168 hours CBC:  Recent Labs Lab 10/21/13 2037 10/22/13 0530 10/23/13 1642 10/24/13 0708 10/27/13 0800  WBC 7.1 6.5 4.6 5.1 7.3  NEUTROABS 4.2  --   --   --   --   HGB 10.0* 8.8* 8.5* 9.3* 9.2*  HCT 30.1* 25.8* 26.7* 28.7* 27.1*  MCV 105.2* 102.8* 109.0* 107.1* 103.0*  PLT 250 230 189 198 237   Cardiac Enzymes:  Recent Labs Lab 10/22/13 0503 10/22/13 1400  TROPONINI <0.30 <0.30   BNP: BNP (last 3 results)  Recent Labs  09/29/13 2359 10/06/13 0334 10/21/13 2037  PROBNP 31055.0* 31834.0* 23932.0*   CBG: No results found for this basename: GLUCAP,  in the last 168 hours  Additional labs: 1. CD4: 450, CD4 percent: 34 2. Hepatitis B surface antigen: Negative 3. HIV 1 RNA quantitative: <20 4. HIV 1 RNA quantitative, log: <1.30   Signed:  Marcellus Scott, MD, FACP, FHM. [facility] Pager [phone]  If 7PM-7AM, please contact night-coverage [email or web address] Password [email or web address] 10/28/2013, 11:46 AM

## 2013-10-28 NOTE — Progress Notes (Signed)
Subjective: Interval History: has complaints not going to HD today.  Objective: Vital signs in last 24 hours: Temp:  [97.9 F (36.6 C)-99.3 F (37.4 C)] 98.6 F (37 C) (01/20 0540) Pulse Rate:  [80-95] 80 (01/20 0540) Resp:  [15-20] 18 (01/20 0540) BP: (114-174)/(73-94) 131/83 mmHg (01/20 0540) SpO2:  [92 %-100 %] 100 % (01/20 0540) Weight:  [113.853 kg (251 lb)-117 kg (257 lb 15 oz)] 113.853 kg (251 lb) (01/19 2253) Weight change:   Intake/Output from previous day: 01/19 0701 - 01/20 0700 In: 120 [P.O.:120] Out: 2353  Intake/Output this shift:    General appearance: cooperative and moderately obese Resp: rales bibasilar Cardio: S1, S2 normal and systolic murmur: systolic ejection 2/6, blowing at apex GI: obese, pos bs, liver down 6 cm Extremities: edema 1+ and AVF L arm  Lab Results:  Recent Labs  10/27/13 0800  WBC 7.3  HGB 9.2*  HCT 27.1*  PLT 237   BMET:  Recent Labs  10/27/13 0800  NA 134*  K 5.4*  CL 88*  CO2 24  GLUCOSE 86  BUN 67*  CREATININE 11.74*  CALCIUM 8.8   No results found for this basename: PTH,  in the last 72 hours Iron Studies: No results found for this basename: IRON, TIBC, TRANSFERRIN, FERRITIN,  in the last 72 hours  Studies/Results: No results found.  I have reviewed the patient's current medications.  Assessment/Plan: 1 CRF for HD MWF.  S/o yest and recurrently so does not receive adequate HD.  This is recurrent issue.  Will not offer extra HD in this circumstance.  Needs Palliative Care to see as this shortens life esp with her comorbidities 2 Obesity 3 HIV 4 HTN lower vol. Bp bettter, taking meds and some lowering of vol. 5 Recurrent fluid xs 6 Nonadherence P HD Wed, lower bp meds. Rec Palliative Care evaluation    LOS: 7 days   Luca Burston L 10/28/2013,7:47 AM

## 2013-10-29 NOTE — Clinical Social Work Note (Signed)
LATE ENTRY DISCHARGE NOTE FOR 10/28/13: [facility], however on 1/21 patient asked about being able to smoke at facility and was advised that she could not smoke in the facility or on the property. Patient remarked that she could not give up smoking and decided that she would discharge to a family member's home as she did not want to return to where she had been living. Patient was transported home via ambulance. Facility informed that patient would not be coming to their facility.  10/29/13: CSW contacted by ED nurse case manager with [facility] as [facility] came to ED. She requested assistance with placing patient in a facility and after consulting with ChiropodistAssistant Director for CSW, was advised that we could not assist as patient was discharged from hospital on 1/20.  Genelle BalVanessa Madix Blowe, MSW, LCSW [phone]

## 2013-10-30 DIAGNOSIS — I509 Heart failure, unspecified: Secondary | ICD-10-CM | POA: Insufficient documentation

## 2013-11-08 ENCOUNTER — Inpatient Hospital Stay (HOSPITAL_COMMUNITY)
Admission: EM | Admit: 2013-11-08 | Discharge: 2013-11-12 | DRG: 640 | Disposition: A | Discharge status: Home / Self Care | Payer: Medicare Other | Attending: Internal Medicine | Admitting: Internal Medicine

## 2013-11-08 ENCOUNTER — Emergency Department (HOSPITAL_COMMUNITY): Payer: Medicare Other

## 2013-11-08 ENCOUNTER — Encounter (HOSPITAL_COMMUNITY): Payer: Self-pay | Admitting: Emergency Medicine

## 2013-11-08 DIAGNOSIS — Z992 Dependence on renal dialysis: Secondary | ICD-10-CM | POA: Diagnosis present

## 2013-11-08 DIAGNOSIS — M25569 Pain in unspecified knee: Secondary | ICD-10-CM

## 2013-11-08 DIAGNOSIS — M25561 Pain in right knee: Secondary | ICD-10-CM

## 2013-11-08 DIAGNOSIS — N186 End stage renal disease: Secondary | ICD-10-CM

## 2013-11-08 DIAGNOSIS — J810 Acute pulmonary edema: Secondary | ICD-10-CM | POA: Diagnosis present

## 2013-11-08 DIAGNOSIS — Z5900 Homelessness unspecified: Secondary | ICD-10-CM

## 2013-11-08 DIAGNOSIS — I509 Heart failure, unspecified: Secondary | ICD-10-CM

## 2013-11-08 DIAGNOSIS — D649 Anemia, unspecified: Secondary | ICD-10-CM

## 2013-11-08 DIAGNOSIS — B20 Human immunodeficiency virus [HIV] disease: Secondary | ICD-10-CM | POA: Diagnosis present

## 2013-11-08 DIAGNOSIS — Z79899 Other long term (current) drug therapy: Secondary | ICD-10-CM

## 2013-11-08 DIAGNOSIS — I10 Essential (primary) hypertension: Secondary | ICD-10-CM | POA: Diagnosis present

## 2013-11-08 DIAGNOSIS — Z9115 Patient's noncompliance with renal dialysis: Secondary | ICD-10-CM

## 2013-11-08 DIAGNOSIS — M25562 Pain in left knee: Secondary | ICD-10-CM

## 2013-11-08 DIAGNOSIS — Z21 Asymptomatic human immunodeficiency virus [HIV] infection status: Secondary | ICD-10-CM | POA: Diagnosis present

## 2013-11-08 DIAGNOSIS — I120 Hypertensive chronic kidney disease with stage 5 chronic kidney disease or end stage renal disease: Secondary | ICD-10-CM | POA: Diagnosis present

## 2013-11-08 DIAGNOSIS — R079 Chest pain, unspecified: Secondary | ICD-10-CM

## 2013-11-08 DIAGNOSIS — F1210 Cannabis abuse, uncomplicated: Secondary | ICD-10-CM | POA: Diagnosis present

## 2013-11-08 DIAGNOSIS — Z91158 Patient's noncompliance with renal dialysis for other reason: Secondary | ICD-10-CM

## 2013-11-08 DIAGNOSIS — K5900 Constipation, unspecified: Secondary | ICD-10-CM | POA: Diagnosis present

## 2013-11-08 DIAGNOSIS — F17200 Nicotine dependence, unspecified, uncomplicated: Secondary | ICD-10-CM | POA: Diagnosis present

## 2013-11-08 DIAGNOSIS — E875 Hyperkalemia: Principal | ICD-10-CM

## 2013-11-08 LAB — CBC WITH DIFFERENTIAL/PLATELET
BASOS ABS: 0 10*3/uL (ref 0.0–0.1)
Basophils Relative: 0 % (ref 0–1)
Eosinophils Absolute: 0.2 10*3/uL (ref 0.0–0.7)
Eosinophils Relative: 2 % (ref 0–5)
HCT: 29.1 % — ABNORMAL LOW (ref 36.0–46.0)
HEMOGLOBIN: 9.7 g/dL — AB (ref 12.0–15.0)
LYMPHS PCT: 33 % (ref 12–46)
Lymphs Abs: 3 10*3/uL (ref 0.7–4.0)
MCH: 35.4 pg — ABNORMAL HIGH (ref 26.0–34.0)
MCHC: 33.3 g/dL (ref 30.0–36.0)
MCV: 106.2 fL — ABNORMAL HIGH (ref 78.0–100.0)
MONO ABS: 1.1 10*3/uL — AB (ref 0.1–1.0)
Monocytes Relative: 12 % (ref 3–12)
NEUTROS ABS: 5 10*3/uL (ref 1.7–7.7)
NEUTROS PCT: 53 % (ref 43–77)
Platelets: 311 10*3/uL (ref 150–400)
RBC: 2.74 MIL/uL — ABNORMAL LOW (ref 3.87–5.11)
RDW: 14.6 % (ref 11.5–15.5)
WBC: 9.3 10*3/uL (ref 4.0–10.5)

## 2013-11-08 LAB — BASIC METABOLIC PANEL
BUN: 70 mg/dL — ABNORMAL HIGH (ref 6–23)
CHLORIDE: 90 meq/L — AB (ref 96–112)
CO2: 22 meq/L (ref 19–32)
CREATININE: 13.13 mg/dL — AB (ref 0.50–1.10)
Calcium: 9 mg/dL (ref 8.4–10.5)
GFR calc non Af Amer: 3 mL/min — ABNORMAL LOW (ref >90)
GFR, EST AFRICAN AMERICAN: 3 mL/min — AB (ref >90)
Glucose, Bld: 88 mg/dL (ref 70–99)
Potassium: 7.3 mEq/L (ref 3.7–5.3)
Sodium: 139 mEq/L (ref 137–147)

## 2013-11-08 LAB — POCT I-STAT TROPONIN I: Troponin i, poc: 0.02 ng/mL (ref 0.00–0.08)

## 2013-11-08 LAB — GLUCOSE, CAPILLARY: GLUCOSE-CAPILLARY: 109 mg/dL — AB (ref 70–99)

## 2013-11-08 LAB — PRO B NATRIURETIC PEPTIDE: Pro B Natriuretic peptide (BNP): 42892 pg/mL — ABNORMAL HIGH (ref 0–125)

## 2013-11-08 LAB — POTASSIUM: Potassium: 7.3 mEq/L (ref 3.7–5.3)

## 2013-11-08 MED ORDER — HEPARIN SODIUM (PORCINE) 1000 UNIT/ML DIALYSIS
20.0000 [IU]/kg | INTRAMUSCULAR | Status: DC | PRN
  Filled 2013-11-08: qty 3

## 2013-11-08 MED ORDER — LIDOCAINE-PRILOCAINE 2.5-2.5 % EX CREA: 1.0000 "application " | TOPICAL_CREAM | CUTANEOUS | Status: DC | PRN

## 2013-11-08 MED ORDER — HEPARIN SODIUM (PORCINE) 1000 UNIT/ML DIALYSIS
100.0000 [IU]/kg | INTRAMUSCULAR | Status: DC | PRN
  Filled 2013-11-08: qty 12

## 2013-11-08 MED ORDER — SODIUM CHLORIDE 0.9 % IV SOLN: 100.0000 mL | INTRAVENOUS | Status: DC | PRN

## 2013-11-08 MED ORDER — CALCIUM GLUCONATE 10 % IV SOLN
1.0000 g | Freq: Once | INTRAVENOUS | Status: DC
  Filled 2013-11-08: qty 10

## 2013-11-08 MED ORDER — PENTAFLUOROPROP-TETRAFLUOROETH EX AERO: 1.0000 "application " | INHALATION_SPRAY | CUTANEOUS | Status: DC | PRN

## 2013-11-08 MED ORDER — DEXTROSE 50 % IV SOLN
1.0000 | Freq: Once | INTRAVENOUS | Status: DC
  Filled 2013-11-08: qty 50

## 2013-11-08 MED ORDER — NEPRO/CARBSTEADY PO LIQD: 237.0000 mL | ORAL | Status: DC | PRN

## 2013-11-08 MED ORDER — INSULIN ASPART 100 UNIT/ML ~~LOC~~ SOLN
10.0000 [IU] | Freq: Once | SUBCUTANEOUS | Status: DC
  Filled 2013-11-08: qty 1

## 2013-11-08 MED ORDER — ASPIRIN 81 MG PO CHEW
324.0000 mg | CHEWABLE_TABLET | Freq: Once | ORAL | Status: AC
  Administered 2013-11-08: 324 mg via ORAL
  Filled 2013-11-08: qty 4

## 2013-11-08 MED ORDER — LIDOCAINE HCL (PF) 1 % IJ SOLN
5.0000 mL | INTRAMUSCULAR | Status: DC | PRN
  Filled 2013-11-08: qty 5

## 2013-11-08 MED ORDER — HEPARIN SODIUM (PORCINE) 1000 UNIT/ML DIALYSIS: 1000.0000 [IU] | INTRAMUSCULAR | Status: DC | PRN

## 2013-11-08 MED ORDER — LIDOCAINE HCL (PF) 1 % IJ SOLN: 5.0000 mL | INTRAMUSCULAR | Status: DC | PRN

## 2013-11-08 MED ORDER — NEPRO/CARBSTEADY PO LIQD
237.0000 mL | ORAL | Status: DC | PRN
  Filled 2013-11-08: qty 237

## 2013-11-08 MED ORDER — ALTEPLASE 2 MG IJ SOLR
2.0000 mg | Freq: Once | INTRAMUSCULAR | Status: AC | PRN
  Filled 2013-11-08: qty 2

## 2013-11-08 MED ORDER — HEPARIN SODIUM (PORCINE) 1000 UNIT/ML DIALYSIS
1000.0000 [IU] | INTRAMUSCULAR | Status: DC | PRN
Start: 2013-11-08 — End: 2013-11-11
  Filled 2013-11-08: qty 1

## 2013-11-08 NOTE — H&P (Signed)
[facility] History and Physical  Patient: [name]  ZOX:[id number]  DOB: 12/23/1950  DOS: the patient was seen and examined on 11/08/2013 PCP: No PCP Per Patient  Chief Complaint: Chest pain  HPI: [name] is a 63 y.o. female with Past medical history of ESRD, HIV, CHF, hypertension, noncompliance. The patient is coming from home. The patient presented with complaints of shortness of breath and chest pain. She mentions that she has been trying to find stable housing for herself due to which she was not able to go for hemodialysis session Saturday. Today she started having numbness of chest pain which was located on the left side without any radiation. This was also started with shortness of breath and cough. She took some nitroglycerin which resolved the chest tightness. Pt denies any fever, chills, headache, PND, nausea, vomiting, abdominal pain, diarrhea, constipation, active bleeding, burning urination, dizziness, pedal edema,  focal neurological deficit.   Review of Systems: as mentioned in the history of present illness.  A Comprehensive review of the other systems is negative.  Past Medical History  Diagnosis Date  . Hypertension   . HIV infection   . Chronic renal failure   . CHF (congestive heart failure)   . ESRD (end stage renal disease) on dialysis 09/30/2013    On dialysis at [facility], per [facility] physicians.  Started dialysis in 2008.  Takes HD MWF schedule.     Past Surgical History  Procedure Laterality Date  . Arteriovenous graft placement     Social History:  reports that she has been smoking.  She does not have any smokeless tobacco history on file. She reports that she drinks alcohol. She reports that she uses illicit drugs (Marijuana) about once per week. Independent for most of her  ADL.  Allergies  Allergen Reactions  . Tramadol     "Gives me bad dreams"  . Morphine And Related Hives and Rash    History reviewed. No pertinent  family history.  Prior to Admission medications   Medication Sig Start Date End Date Taking? Authorizing Provider  amLODipine (NORVASC) 5 MG tablet Take 1 tablet (5 mg total) by mouth at bedtime. 10/28/13  Yes Elease Etienne, MD  carvedilol (COREG) 3.125 MG tablet Take 1 tablet (3.125 mg total) by mouth 2 (two) times daily with a meal. 10/28/13  Yes Elease Etienne, MD  cinacalcet (SENSIPAR) 60 MG tablet Take 60 mg by mouth daily.   Yes Historical Provider, MD  diphenhydrAMINE (BENADRYL) 50 MG capsule Take 1 capsule (50 mg total) by mouth at bedtime. 10/28/13  Yes Elease Etienne, MD  FLUoxetine (PROZAC) 20 MG capsule Take 1 capsule (20 mg total) by mouth daily. 10/28/13  Yes Elease Etienne, MD  hydrOXYzine (ATARAX/VISTARIL) 25 MG tablet Take 1 tablet (25 mg total) by mouth 3 (three) times daily. 10/28/13  Yes Elease Etienne, MD  lamiVUDine (EPIVIR) 10 MG/ML solution Take 5 mLs (50 mg total) by mouth daily. 10/28/13  Yes Elease Etienne, MD  multivitamin (RENA-VIT) TABS tablet Take 1 tablet by mouth at bedtime. 10/28/13  Yes Elease Etienne, MD  omeprazole (PRILOSEC) 20 MG capsule Take 20 mg by mouth daily.   Yes Historical Provider, MD  raltegravir (ISENTRESS) 400 MG tablet Take 400 mg by mouth 2 (two) times daily.    Yes Historical Provider, MD  sevelamer carbonate (RENVELA) 800 MG tablet Take 1,600-2,400 mg by mouth 5 (five) times daily. 3 Tabs with meals and 2  tabs with snacks   Yes Historical Provider, MD  tenofovir (VIREAD) 300 MG tablet Take 300 mg by mouth every Friday. Takes on Fridays.   Yes Historical Provider, MD    Physical Exam: Filed Vitals:   11/08/13 1846 11/08/13 1956 11/08/13 2051  BP: 167/97 159/79 146/91  Pulse: 92 94   Temp: 97.9 F (36.6 C) 98.1 F (36.7 C)   TempSrc: Oral Oral   Resp: 18 19 20   SpO2: 94% 98% 97%    General: Alert, Awake and Oriented to Time, Place and Person. Appear in mild distress Eyes: PERRL ENT: Oral Mucosa clear moist. Neck: Difficult  to assess JVD Cardiovascular: S1 and S2 Present, aortic systolic Murmur, Peripheral Pulses Present Respiratory: Bilateral Air entry equal and Decreased, bilateral basal Crackles, no wheezes Abdomen: Bowel Sound Present, Soft and Non tender Skin: No Rash Extremities: Bilateral Pedal edema, no calf tenderness Neurologic: Grossly Unremarkable. Labs on Admission:  CBC:  Recent Labs Lab 11/08/13 1853  WBC 9.3  NEUTROABS 5.0  HGB 9.7*  HCT 29.1*  MCV 106.2*  PLT 311    CMP     Component Value Date/Time   NA 139 11/08/2013 1853   K 7.3* 11/08/2013 2004   CL 90* 11/08/2013 1853   CO2 22 11/08/2013 1853   GLUCOSE 88 11/08/2013 1853   BUN 70* 11/08/2013 1853   CREATININE 13.13* 11/08/2013 1853   CALCIUM 9.0 11/08/2013 1853   PROT 7.5 05/10/2012 1402   ALBUMIN 3.4* 10/27/2013 0800   AST 16 05/10/2012 1402   ALT 16 05/10/2012 1402   ALKPHOS 294* 05/10/2012 1402   BILITOT 0.3 05/10/2012 1402   GFRNONAA 3* 11/08/2013 1853   GFRAA 3* 11/08/2013 1853    No results found for this basename: LIPASE, AMYLASE,  in the last 168 hours No results found for this basename: AMMONIA,  in the last 168 hours  No results found for this basename: CKTOTAL, CKMB, CKMBINDEX, TROPONINI,  in the last 168 hours BNP (last 3 results)  Recent Labs  10/06/13 0334 10/21/13 2037 11/08/13 1903  PROBNP 31834.0* 23932.0* 42892.0*    Radiological Exams on Admission: Dg Chest 2 View  11/08/2013   CLINICAL DATA:  Recent pneumonia, dialysis patient, patient depressed  EXAM: CHEST  2 VIEW  COMPARISON:  10/21/2013  FINDINGS: Stable mild cardiomegaly and uncoiling of the aorta. Vascular pattern normal. Mild central bronchitic change is stable. Areas of discoid atelectasis have improved. No consolidation or effusion.  IMPRESSION: No evidence of pulmonary edema currently. Vascular congestion with mild bronchitic change. Bilateral infiltrates seen on the prior study have essentially resolved and may have been due to either pneumonia or  atelectasis.   Electronically Signed   By: Esperanza Heir M.D.   On: 11/08/2013 20:07    EKG: Independently reviewed. normal EKG, normal sinus rhythm, nonspecific ST and T waves changes.  Assessment/Plan Principal Problem:   Hyperkalemia Active Problems:   HIV infection   ESRD (end stage renal disease)   Hypertension   Pulmonary edema, acute   Chest pain   1. Hyperkalemia The patient is presenting with missing her hemodialysis session and elevated potassium. She does not have any significant arrhythmias on telemetry. Nephrology has been consulted Will perform hemodialysis overnight Patient will be kept on low potassium diet and continue hemodialysis as and when needed Patient has received IV calcium gluconate and IV insulin and dextrose combination in the ED.  2. HIV infection Continue her home anti-viral as prescribed  3. Chest pain Severe troponins  and telemetry monitoring At present no signs of acute ischemia  4. Hypertension Continue her home antihypertensive medications  Consults: Nephrology appreciate input  DVT Prophylaxis: subcutaneous Heparin Nutrition: Renal diet  Code Status: Full  Disposition: Admitted to inpatient in telemetry.  Author: Lynden OxfordPranav Patel, MD [facility] Pager: [phone] 11/08/2013, 9:23 PM    If 7PM-7AM, please contact night-coverage [email or web address] Password [email or web address]

## 2013-11-08 NOTE — ED Notes (Signed)
Pt arrived by [facility]. Reports being tues/thurs/sat dialysis but was unable to go today. Reports dialysis center calling to tell her that K is 7.1 and then pt noticed she had sob and mid chest tightness and called ems. Received 1 nitro pta which helped relieve her pain.

## 2013-11-08 NOTE — ED Notes (Signed)
MD Ward notified of Critical K+ of 7.3

## 2013-11-08 NOTE — ED Notes (Signed)
Per MD Ward, Repeat K+

## 2013-11-08 NOTE — ED Provider Notes (Signed)
TIME SEEN: 8:30 PM  CHIEF COMPLAINT: Chest pain, shortness of breath  HPI: Patient is a 63 y.o. female with a history of hypertension, end-stage renal disease on hemodialysis, CHF, HIV who presents to the emergency department with complaints of intermittent chest pain and shortness of breath. She describes the pain as pressure in the left side of her chest without radiation. She states she has shortness of breath with exertion. Her chest pain was resolved with one nitroglycerin tablet with EMS. She reports she was also told by her dialysis Center that her potassium was greater than 7 and she needs to come to the emergency department. She denies any recent fevers but has had dry cough. No lower extremity swelling or pain. No orthopnea.  ROS: See HPI Constitutional: no fever  Eyes: no drainage  ENT: no runny nose   Cardiovascular:  chest pain  Resp:  SOB  GI: no vomiting GU: no dysuria Integumentary: no rash  Allergy: no hives  Musculoskeletal: no leg swelling  Neurological: no slurred speech ROS otherwise negative  PAST MEDICAL HISTORY/PAST SURGICAL HISTORY:  Past Medical History  Diagnosis Date  . Hypertension   . HIV infection   . Chronic renal failure   . CHF (congestive heart failure)   . ESRD (end stage renal disease) on dialysis 09/30/2013    [facility]  Started dialysis in 2008.  Takes HD MWF schedule.      MEDICATIONS:  Prior to Admission medications   Medication Sig Start Date End Date Taking? Authorizing Provider  amLODipine (NORVASC) 5 MG tablet Take 1 tablet (5 mg total) by mouth at bedtime. 10/28/13  Yes Elease EtienneAnand D Hongalgi, MD  carvedilol (COREG) 3.125 MG tablet Take 1 tablet (3.125 mg total) by mouth 2 (two) times daily with a meal. 10/28/13  Yes Elease EtienneAnand D Hongalgi, MD  cinacalcet (SENSIPAR) 60 MG tablet Take 60 mg by mouth daily.   Yes Historical Provider, MD  diphenhydrAMINE (BENADRYL) 50 MG capsule Take 1 capsule (50 mg total) by  mouth at bedtime. 10/28/13  Yes Elease EtienneAnand D Hongalgi, MD  FLUoxetine (PROZAC) 20 MG capsule Take 1 capsule (20 mg total) by mouth daily. 10/28/13  Yes Elease EtienneAnand D Hongalgi, MD  hydrOXYzine (ATARAX/VISTARIL) 25 MG tablet Take 1 tablet (25 mg total) by mouth 3 (three) times daily. 10/28/13  Yes Elease EtienneAnand D Hongalgi, MD  lamiVUDine (EPIVIR) 10 MG/ML solution Take 5 mLs (50 mg total) by mouth daily. 10/28/13  Yes Elease EtienneAnand D Hongalgi, MD  multivitamin (RENA-VIT) TABS tablet Take 1 tablet by mouth at bedtime. 10/28/13  Yes Elease EtienneAnand D Hongalgi, MD  omeprazole (PRILOSEC) 20 MG capsule Take 20 mg by mouth daily.   Yes Historical Provider, MD  raltegravir (ISENTRESS) 400 MG tablet Take 400 mg by mouth 2 (two) times daily.    Yes Historical Provider, MD  sevelamer carbonate (RENVELA) 800 MG tablet Take 1,600-2,400 mg by mouth 5 (five) times daily. 3 Tabs with meals and 2 tabs with snacks   Yes Historical Provider, MD  tenofovir (VIREAD) 300 MG tablet Take 300 mg by mouth every Friday. Takes on Fridays.   Yes Historical Provider, MD    ALLERGIES:  Allergies  Allergen Reactions  . Morphine And Related Hives and Rash    SOCIAL HISTORY:  History  Substance Use Topics  . Smoking status: Current Every Day Smoker -- 0.50 packs/day  . Smokeless tobacco: Not on file  . Alcohol Use: Yes    FAMILY HISTORY: History reviewed.  No pertinent family history.  EXAM: BP 159/79  Pulse 94  Temp(Src) 98.1 F (36.7 C) (Oral)  Resp 19  SpO2 98% CONSTITUTIONAL: Alert and oriented and responds appropriately to questions. Well-appearing; well-nourished HEAD: Normocephalic EYES: Conjunctivae clear, PERRL ENT: normal nose; no rhinorrhea; moist mucous membranes; pharynx without lesions noted NECK: Supple, no meningismus, no LAD  CARD: RRR; S1 and S2 appreciated; no murmurs, no clicks, no rubs, no gallops RESP: Normal chest excursion without splinting or tachypnea; breath sounds clear and equal bilaterally; no wheezes, no rhonchi, no  rales,  ABD/GI: Normal bowel sounds; non-distended; soft, non-tender, no rebound, no guarding BACK:  The back appears normal and is non-tender to palpation, there is no CVA tenderness EXT: Normal ROM in all joints; non-tender to palpation; no edema; normal capillary refill; no cyanosis    SKIN: Normal color for age and race; warm NEURO: Moves all extremities equally PSYCH: The patient's mood and manner are appropriate. Grooming and personal hygiene are appropriate.  MEDICAL DECISION MAKING: Patient here with chest pain and shortness of breath and reports of hyperkalemia. Her initial potassium here is greater than 7. Will repeat. No signs of volume overload on exam. She is currently chest pain-free. EKG shows no T-wave changes or interval changes.  No ischemic changes.  ED PROGRESS: Patient here with hyperkalemia that was confirmed. Discussed with Dr. Arlean Hopping with nephrology who will place orders to dialyze patient tonight.  D/w hospitalist for admission.      EKG Interpretation    Date/Time:  Saturday November 08 2013 18:42:51 EST Ventricular Rate:  88 PR Interval:  208 QRS Duration: 117 QT Interval:  383 QTC Calculation: 463 R Axis:   37 Text Interpretation:  Sinus rhythm Nonspecific intraventricular conduction delay No significant change since last tracing Confirmed by Sundance Moise  DO, Vinson Tietze ([id number]) on 11/08/2013 8:50:08 PM             Layla Maw Venezia Sargeant, DO 11/09/13 4401

## 2013-11-08 NOTE — ED Notes (Signed)
PT REQUESTING SOCIAL WORKER

## 2013-11-09 LAB — CBC
HCT: 28.2 % — ABNORMAL LOW (ref 36.0–46.0)
Hemoglobin: 9.4 g/dL — ABNORMAL LOW (ref 12.0–15.0)
MCH: 35.7 pg — ABNORMAL HIGH (ref 26.0–34.0)
MCHC: 33.3 g/dL (ref 30.0–36.0)
MCV: 107.2 fL — ABNORMAL HIGH (ref 78.0–100.0)
PLATELETS: 266 10*3/uL (ref 150–400)
RBC: 2.63 MIL/uL — ABNORMAL LOW (ref 3.87–5.11)
RDW: 14.7 % (ref 11.5–15.5)
WBC: 6.4 10*3/uL (ref 4.0–10.5)

## 2013-11-09 LAB — COMPREHENSIVE METABOLIC PANEL
ALK PHOS: 301 U/L — AB (ref 39–117)
ALT: 6 U/L (ref 0–35)
AST: 12 U/L (ref 0–37)
Albumin: 3.5 g/dL (ref 3.5–5.2)
BILIRUBIN TOTAL: 0.3 mg/dL (ref 0.3–1.2)
BUN: 22 mg/dL (ref 6–23)
CO2: 27 meq/L (ref 19–32)
Calcium: 9.3 mg/dL (ref 8.4–10.5)
Chloride: 93 mEq/L — ABNORMAL LOW (ref 96–112)
Creatinine, Ser: 6.5 mg/dL — ABNORMAL HIGH (ref 0.50–1.10)
GFR calc Af Amer: 7 mL/min — ABNORMAL LOW (ref >90)
GFR, EST NON AFRICAN AMERICAN: 6 mL/min — AB (ref >90)
GLUCOSE: 178 mg/dL — AB (ref 70–99)
POTASSIUM: 3.7 meq/L (ref 3.7–5.3)
Sodium: 140 mEq/L (ref 137–147)
Total Protein: 7.7 g/dL (ref 6.0–8.3)

## 2013-11-09 LAB — PROTIME-INR
INR: 1.1 (ref 0.00–1.49)
PROTHROMBIN TIME: 14 s (ref 11.6–15.2)

## 2013-11-09 LAB — TROPONIN I

## 2013-11-09 LAB — MRSA PCR SCREENING: MRSA by PCR: NEGATIVE

## 2013-11-09 MED ORDER — SEVELAMER CARBONATE 800 MG PO TABS
2400.0000 mg | ORAL_TABLET | Freq: Three times a day (TID) | ORAL | Status: DC
  Administered 2013-11-09 – 2013-11-12 (×9): 2400 mg via ORAL
  Filled 2013-11-09 (×13): qty 3

## 2013-11-09 MED ORDER — ACETAMINOPHEN 650 MG RE SUPP: 650.0000 mg | Freq: Four times a day (QID) | RECTAL | Status: DC | PRN

## 2013-11-09 MED ORDER — HYDROXYZINE HCL 25 MG PO TABS: 25.0000 mg | ORAL_TABLET | Freq: Three times a day (TID) | ORAL | Status: DC | PRN

## 2013-11-09 MED ORDER — CARVEDILOL 3.125 MG PO TABS
3.1250 mg | ORAL_TABLET | Freq: Two times a day (BID) | ORAL | Status: DC
  Administered 2013-11-09 – 2013-11-12 (×7): 3.125 mg via ORAL
  Filled 2013-11-09 (×11): qty 1

## 2013-11-09 MED ORDER — FLUOXETINE HCL 20 MG PO CAPS
20.0000 mg | ORAL_CAPSULE | Freq: Every day | ORAL | Status: DC
  Administered 2013-11-09 – 2013-11-12 (×4): 20 mg via ORAL
  Filled 2013-11-09 (×4): qty 1

## 2013-11-09 MED ORDER — ONDANSETRON HCL 4 MG/2ML IJ SOLN
4.0000 mg | Freq: Four times a day (QID) | INTRAMUSCULAR | Status: DC | PRN
  Filled 2013-11-09: qty 2

## 2013-11-09 MED ORDER — ACETAMINOPHEN 325 MG PO TABS
650.0000 mg | ORAL_TABLET | Freq: Four times a day (QID) | ORAL | Status: DC | PRN
  Administered 2013-11-09 – 2013-11-10 (×2): 650 mg via ORAL
  Filled 2013-11-09 (×2): qty 2

## 2013-11-09 MED ORDER — TENOFOVIR DISOPROXIL FUMARATE 300 MG PO TABS: 300.0000 mg | ORAL_TABLET | ORAL | Status: DC

## 2013-11-09 MED ORDER — AMLODIPINE BESYLATE 5 MG PO TABS
5.0000 mg | ORAL_TABLET | Freq: Every day | ORAL | Status: DC
  Administered 2013-11-09 – 2013-11-10 (×2): 5 mg via ORAL
  Filled 2013-11-09 (×4): qty 1

## 2013-11-09 MED ORDER — SODIUM CHLORIDE 0.9 % IJ SOLN
3.0000 mL | Freq: Two times a day (BID) | INTRAMUSCULAR | Status: DC
  Administered 2013-11-09 – 2013-11-12 (×3): 3 mL via INTRAVENOUS

## 2013-11-09 MED ORDER — SEVELAMER CARBONATE 800 MG PO TABS
1600.0000 mg | ORAL_TABLET | Freq: Two times a day (BID) | ORAL | Status: DC | PRN
  Filled 2013-11-09: qty 2

## 2013-11-09 MED ORDER — ONDANSETRON HCL 4 MG PO TABS
4.0000 mg | ORAL_TABLET | Freq: Four times a day (QID) | ORAL | Status: DC | PRN
  Administered 2013-11-11: 4 mg via ORAL
  Filled 2013-11-09: qty 1

## 2013-11-09 MED ORDER — LAMIVUDINE 10 MG/ML PO SOLN
50.0000 mg | Freq: Every day | ORAL | Status: DC
  Administered 2013-11-09 – 2013-11-12 (×4): 50 mg via ORAL
  Filled 2013-11-09 (×4): qty 5

## 2013-11-09 MED ORDER — DIPHENHYDRAMINE HCL 25 MG PO CAPS
50.0000 mg | ORAL_CAPSULE | Freq: Every evening | ORAL | Status: DC | PRN
  Administered 2013-11-09 – 2013-11-11 (×3): 50 mg via ORAL
  Filled 2013-11-09 (×3): qty 2

## 2013-11-09 MED ORDER — HEPARIN SODIUM (PORCINE) 5000 UNIT/ML IJ SOLN
5000.0000 [IU] | Freq: Three times a day (TID) | INTRAMUSCULAR | Status: DC
  Administered 2013-11-09 – 2013-11-11 (×5): 5000 [IU] via SUBCUTANEOUS
  Filled 2013-11-09 (×13): qty 1

## 2013-11-09 MED ORDER — ASPIRIN EC 81 MG PO TBEC
81.0000 mg | DELAYED_RELEASE_TABLET | Freq: Every day | ORAL | Status: DC
  Administered 2013-11-09 – 2013-11-12 (×4): 81 mg via ORAL
  Filled 2013-11-09 (×4): qty 1

## 2013-11-09 MED ORDER — ALBUTEROL SULFATE (2.5 MG/3ML) 0.083% IN NEBU: 2.5000 mg | INHALATION_SOLUTION | Freq: Four times a day (QID) | RESPIRATORY_TRACT | Status: DC | PRN

## 2013-11-09 MED ORDER — RALTEGRAVIR POTASSIUM 400 MG PO TABS
400.0000 mg | ORAL_TABLET | Freq: Two times a day (BID) | ORAL | Status: DC
  Administered 2013-11-09 – 2013-11-12 (×8): 400 mg via ORAL
  Filled 2013-11-09 (×9): qty 1

## 2013-11-09 MED ORDER — PANTOPRAZOLE SODIUM 40 MG PO TBEC
40.0000 mg | DELAYED_RELEASE_TABLET | Freq: Every day | ORAL | Status: DC
  Administered 2013-11-09 – 2013-11-12 (×4): 40 mg via ORAL
  Filled 2013-11-09 (×3): qty 1

## 2013-11-09 MED ORDER — CINACALCET HCL 30 MG PO TABS
60.0000 mg | ORAL_TABLET | Freq: Every day | ORAL | Status: DC
  Administered 2013-11-09 – 2013-11-12 (×3): 60 mg via ORAL
  Filled 2013-11-09 (×5): qty 2

## 2013-11-09 NOTE — Evaluation (Signed)
Physical Therapy Evaluation Patient Details Name: [name] MRN: [id number] DOB: [birth year] Today's Date: 11/09/2013 Time: 4098-1191 PT Time Calculation (min): 23 min  PT Assessment / Plan / Recommendation History of Present Illness  [name] is a 63 y.o. female with Past medical history of ESRD, HIV, CHF, hypertension, noncompliance. The patient presented with complaints of shortness of breath and chest pain. She mentions that she has been trying to find stable housing for herself due to which she was not able to go for hemodialysis session Saturday. Today she started having numbness of chest pain which was located on the left side without any radiation. This was also started with shortness of breath and cough. She took some nitroglycerin which resolved the chest tightness.  Clinical Impression  Pt adm due to the above. Presents with decreased independence with functional mobility secondary to deficits indicated below. Pt to benefit from skilled acute PT to address deficits listed below and increase mobility. Pt does not have 24/7 (A) at home at this time. Would benefit from placement for post acute rehab prior to returning home to increase independence. Pt has power wheelchair and RW but reports it to be on 6th floor. RN is aware.Pt with multiple recent admissions. Limited to transfer to chair only today secondary to pain.   PT Assessment  Patient needs continued PT services    Follow Up Recommendations  SNF    Does the patient have the potential to tolerate intense rehabilitation      Barriers to Discharge Decreased caregiver support;Inaccessible home environment pt has STE house and has difficulty doing so; pt is alone throughout day     Equipment Recommendations  None recommended by PT    Recommendations for Other Services OT consult   Frequency Min 3X/week    Precautions / Restrictions Precautions Precautions: Fall Precaution Comments: Pt reports she had bed  bugs recently but was treated for them at [facility]; RN made aware  Restrictions Weight Bearing Restrictions: No   Pertinent Vitals/Pain 8/10; "back and Lt LE" predominately. patient repositioned for comfort in chair.       Mobility  Bed Mobility Overal bed mobility: Modified Independent Bed Mobility: Supine to Sit Supine to sit: HOB elevated;Modified independent (Device/Increase time) General bed mobility comments: effortfull but no physical (A) needed; relies on HOB elevated and handrails Transfers Overall transfer level: Needs assistance Equipment used: Rolling walker (2 wheeled) Transfers: Sit to/from Stand Sit to Stand: Min guard Stand pivot transfers: Min guard;From elevated surface General transfer comment: cues for hand placement and sequencing with RW; pt tends to keep fwd flexed trunk; unsteady with transfers; limited by pain today  Ambulation/Gait General Gait Details: pt unable to today; c/o pain in Lt LE          PT Diagnosis: Difficulty walking;Acute pain  PT Problem List: Decreased strength;Decreased activity tolerance;Decreased balance;Decreased mobility;Decreased knowledge of use of DME;Pain;Decreased safety awareness PT Treatment Interventions: DME instruction;Gait training;Stair training;Functional mobility training;Therapeutic activities;Therapeutic exercise;Balance training;Patient/family education     PT Goals(Current goals can be found in the care plan section) Acute Rehab PT Goals Patient Stated Goal: Not to go home PT Goal Formulation: With patient Time For Goal Achievement: 11/23/13 Potential to Achieve Goals: Good  Visit Information  Last PT Received On: 11/09/13 Assistance Needed: +1 History of Present Illness: [name] is a 63 y.o. female with Past medical history of ESRD, HIV, CHF, hypertension, noncompliance. The patient presented with complaints of shortness of breath and chest  pain. She mentions that she has been trying  to find stable housing for herself due to which she was not able to go for hemodialysis session Saturday. Today she started having numbness of chest pain which was located on the left side without any radiation. This was also started with shortness of breath and cough. She took some nitroglycerin which resolved the chest tightness.       Prior Functioning  Home Living Family/patient expects to be discharged to:: Assisted living Living Arrangements: Non-relatives/Friends Home Equipment: Wheelchair - power;Walker - 2 wheels Additional Comments: pt lives in trailer with friends; reports she has been having increased difficulty getting around house and performing ADLs; pt has roommates help her but not all day; rommates work during the day  Prior Function Level of Independence: Needs assistance Gait / Transfers Assistance Needed: uses electric wheelchair predominately; has difficulty negotiating steps at home; require (A) to ambulate up/down steps  ADL's / Homemaking Assistance Needed: requires (A) for washing back side and donning socks/shoes at times; reports she does not always have 24/7 (A)  Comments: pt reports she has left her power wheelchair and RW on 6th floor Communication Communication: No difficulties    Cognition  Cognition Arousal/Alertness: Awake/alert Behavior During Therapy: WFL for tasks assessed/performed Overall Cognitive Status: Within Functional Limits for tasks assessed    Extremity/Trunk Assessment Lower Extremity Assessment Lower Extremity Assessment: LLE deficits/detail LLE Deficits / Details: pt limited by pain; grossly 3+/5 in knee; c/o pain in Lt knee today  Cervical / Trunk Assessment Cervical / Trunk Assessment: Kyphotic   Balance Balance Overall balance assessment: Needs assistance Sitting-balance support: Feet supported;Bilateral upper extremity supported Sitting balance-Leahy Scale: Good Postural control: Other (comment) (anterior lean) Standing balance  support: During functional activity;Bilateral upper extremity supported Standing balance-Leahy Scale: Fair Standing balance comment: bil UE supported by RW  End of Session PT - End of Session Equipment Utilized During Treatment: Gait belt Activity Tolerance: Patient limited by pain Patient left: in chair;with call bell/phone within reach;with nursing/sitter in room Nurse Communication: Mobility status  GP     Donell SievertWest, Navah Grondin N, [place] CarolinaPT [phone] 11/09/2013, 2:36 PM

## 2013-11-09 NOTE — Consult Note (Signed)
Renal Service Consult Note [facility]  Wei Newbrough 11/09/2013 Delano Metz D Requesting Physician:  Dr Ardyth Harps  Reason for Consult:  ESRD pt with hyperkalemia HPI: The patient is a 63 y.o. year-old with hx of HIV, HTN and ESRD on HD in [place] with [facility] group.  She missed HD on Sat 1/31, then says that dialysis unit called her and her labs from a prior visit showed high K+ and told her to go to the ER.  In ED she reported CP and SOB. CXR showed no edema. K+ was 7.3. She had dialysis overnight and repeat K today is 3.7.  Pt denies any SOB now. 2.5kg were removed and BP came down with HD from 180 > 130's systolic.    ROS  no ha,   no blurry vision  no abd pain, n/v/d  no jt pain   no skin rash  Past Medical History  Past Medical History  Diagnosis Date  . Hypertension   . HIV infection   . Chronic renal failure   . CHF (congestive heart failure)   . ESRD (end stage renal disease) on dialysis 09/30/2013    On dialysis at [facility], per [facility] physicians.  Started dialysis in 2008.  Takes HD MWF schedule.     Past Surgical History  Past Surgical History  Procedure Laterality Date  . Arteriovenous graft placement     Family History History reviewed. No pertinent family history. Social History  reports that she has been smoking.  She does not have any smokeless tobacco history on file. She reports that she drinks alcohol. She reports that she uses illicit drugs (Marijuana) about once per week. Allergies  Allergies  Allergen Reactions  . Tramadol     "Gives me bad dreams"  . Morphine And Related Hives and Rash   Home medications Prior to Admission medications   Medication Sig Start Date End Date Taking? Authorizing Provider  amLODipine (NORVASC) 5 MG tablet Take 1 tablet (5 mg total) by mouth at bedtime. 10/28/13  Yes Elease Etienne, MD  carvedilol (COREG) 3.125 MG tablet Take 1 tablet (3.125 mg total) by mouth 2 (two) times daily with a meal.  10/28/13  Yes Elease Etienne, MD  cinacalcet (SENSIPAR) 60 MG tablet Take 60 mg by mouth daily.   Yes Historical Provider, MD  diphenhydrAMINE (BENADRYL) 50 MG capsule Take 1 capsule (50 mg total) by mouth at bedtime. 10/28/13  Yes Elease Etienne, MD  FLUoxetine (PROZAC) 20 MG capsule Take 1 capsule (20 mg total) by mouth daily. 10/28/13  Yes Elease Etienne, MD  hydrOXYzine (ATARAX/VISTARIL) 25 MG tablet Take 1 tablet (25 mg total) by mouth 3 (three) times daily. 10/28/13  Yes Elease Etienne, MD  lamiVUDine (EPIVIR) 10 MG/ML solution Take 5 mLs (50 mg total) by mouth daily. 10/28/13  Yes Elease Etienne, MD  multivitamin (RENA-VIT) TABS tablet Take 1 tablet by mouth at bedtime. 10/28/13  Yes Elease Etienne, MD  omeprazole (PRILOSEC) 20 MG capsule Take 20 mg by mouth daily.   Yes Historical Provider, MD  raltegravir (ISENTRESS) 400 MG tablet Take 400 mg by mouth 2 (two) times daily.    Yes Historical Provider, MD  sevelamer carbonate (RENVELA) 800 MG tablet Take 1,600-2,400 mg by mouth 5 (five) times daily. 3 Tabs with meals and 2 tabs with snacks   Yes Historical Provider, MD  tenofovir (VIREAD) 300 MG tablet Take 300 mg by mouth every Friday. Takes on Fridays.  Yes Historical Provider, MD   Liver Function Tests  Recent Labs Lab 11/09/13 0537  AST 12  ALT 6  ALKPHOS 301*  BILITOT 0.3  PROT 7.7  ALBUMIN 3.5   No results found for this basename: LIPASE, AMYLASE,  in the last 168 hours CBC  Recent Labs Lab 11/08/13 1853 11/09/13 0537  WBC 9.3 6.4  NEUTROABS 5.0  --   HGB 9.7* 9.4*  HCT 29.1* 28.2*  MCV 106.2* 107.2*  PLT 311 266   Basic Metabolic Panel  Recent Labs Lab 11/08/13 1853 11/08/13 2004 11/09/13 0537  NA 139  --  140  K 7.3* 7.3* 3.7  CL 90*  --  93*  CO2 22  --  27  GLUCOSE 88  --  178*  BUN 70*  --  22  CREATININE 13.13*  --  6.50*  CALCIUM 9.0  --  9.3    Exam  Blood pressure 124/66, pulse 103, temperature 98.5 F (36.9 C), temperature source  Oral, resp. rate 18, height 5\' 4"  (1.626 m), weight 110.95 kg (244 lb 9.6 oz), SpO2 92.00%. Obese, pleasant AAF no distress lying flat No rash, cyanosis or gangrene Sclera anicteric, throat clear No jvd Chest is clear bilat RRR no RG, 2/6 soft SEM Abd obese, soft, nt, nd No LE or UE edema AVF patent   [facility]  Assessment: 1 Hyperkalemia, resolved 2 ESRD, missed Sat HD 3 Volume- ok 4 HTN 5 HIV   Plan- No further HD required at this time. Next HD Tuesday if still here. Stable for d/c from renal standpoint.    Vinson Moselleob Nithya Meriweather MD (pgr) [phone]    ([phone]) [phone] 11/09/2013, 10:01 AM

## 2013-11-09 NOTE — Procedures (Signed)
I was present at this dialysis session, have reviewed the session itself and made  appropriate changes  Vinson Moselleob Jacarra Bobak MD (pgr) [phone]    ([phone]) [phone] 11/09/2013, 1:55 AM

## 2013-11-09 NOTE — Progress Notes (Signed)
Pt refused another troponin draw

## 2013-11-09 NOTE — Progress Notes (Signed)
Pt refused troponin to be drawn

## 2013-11-09 NOTE — Progress Notes (Signed)
Brought to my attention by the patient that she has been living with someone who has a "bad case of bed bugs" at her house. Pt reports that the outpatient diaylsis department made her "strip down" prior to entering their facility on her last treatment day. [name] stated that she had bed bugs on her "really bad" last week and they "bit her alive" but she thinking they are "almost gone." Patient is still scratching her legs and stated it "feels like something is biting my feet." I did not personally see any bugs in the room. Patient stated that the bugs that had been biting her she couldn't see but kept getting bug bites all over her body. Patient placed on contact precautions and informed of reason why and was ok with decision to to so. Will monitor for any bugs

## 2013-11-09 NOTE — Progress Notes (Signed)
[facility] PROGRESS NOTE  [name] ZOX:[id number] DOB: 1951-06-01 DOA: 11/08/2013 PCP: No PCP Per Patient  Assessment/Plan: Chest Pain -Resolved. -Refuses further work up. Does not want any further blood work.  Hyperkalemia -Due to missed HD. -Resolved. 3.7 today.  HIV -Continue HAART therapy.  HTN -Continue home meds.  Social Issues -She is homeless. -Will request SW evaluation, PT/OT evals to see if she can be placed.   Code Status: Full Code Family Communication: Patient only  Disposition Plan: To be determined   Consultants:  Renal   Antibiotics:  None   Subjective: No complaints.  Objective: Filed Vitals:   11/09/13 0135 11/09/13 0145 11/09/13 0209 11/09/13 0530  BP: 174/111 181/87 137/87 124/66  Pulse: 112 114 120 103  Temp:  98.1 F (36.7 C) 98.1 F (36.7 C) 98.5 F (36.9 C)  TempSrc:  Oral  Oral  Resp: 16 16 18 18   Height:  5\' 4"  (1.626 m)    Weight:  110.95 kg (244 lb 9.6 oz)    SpO2:   97% 92%    Intake/Output Summary (Last 24 hours) at 11/09/13 1424 Last data filed at 11/09/13 0900  Gross per 24 hour  Intake    720 ml  Output   2500 ml  Net  -1780 ml   Filed Weights   11/08/13 2215 11/09/13 0145  Weight: 113.8 kg (250 lb 14.1 oz) 110.95 kg (244 lb 9.6 oz)    Exam:   General:  AA Ox3  Cardiovascular: RRR  Respiratory: CTA B  Abdomen: S/NT/ND/+BS  Extremities: trace bilateral edema    Neurologic:  Non-focal  Data Reviewed: Basic Metabolic Panel:  Recent Labs Lab 11/08/13 1853 11/08/13 2004 11/09/13 0537  NA 139  --  140  K 7.3* 7.3* 3.7  CL 90*  --  93*  CO2 22  --  27  GLUCOSE 88  --  178*  BUN 70*  --  22  CREATININE 13.13*  --  6.50*  CALCIUM 9.0  --  9.3   Liver Function Tests:  Recent Labs Lab 11/09/13 0537  AST 12  ALT 6  ALKPHOS 301*  BILITOT 0.3  PROT 7.7  ALBUMIN 3.5   No results found for this basename: LIPASE, AMYLASE,  in the last 168 hours No results found for  this basename: AMMONIA,  in the last 168 hours CBC:  Recent Labs Lab 11/08/13 1853 11/09/13 0537  WBC 9.3 6.4  NEUTROABS 5.0  --   HGB 9.7* 9.4*  HCT 29.1* 28.2*  MCV 106.2* 107.2*  PLT 311 266   Cardiac Enzymes:  Recent Labs Lab 11/09/13 0537  TROPONINI <0.30   BNP (last 3 results)  Recent Labs  10/06/13 0334 10/21/13 2037 11/08/13 1903  PROBNP 31834.0* 23932.0* 42892.0*   CBG:  Recent Labs Lab 11/08/13 2045  GLUCAP 109*    Recent Results (from the past 240 hour(s))  MRSA PCR SCREENING     Status: None   Collection Time    11/09/13  2:48 AM      Result Value Range Status   MRSA by PCR NEGATIVE  NEGATIVE Final   Comment:            The GeneXpert MRSA Assay (FDA     approved for NASAL specimens     only), is one component of a     comprehensive MRSA colonization     surveillance program. It is not     intended to diagnose MRSA  infection nor to guide or     monitor treatment for     MRSA infections.     Studies: Dg Chest 2 View  11/08/2013   CLINICAL DATA:  Recent pneumonia, dialysis patient, patient depressed  EXAM: CHEST  2 VIEW  COMPARISON:  10/21/2013  FINDINGS: Stable mild cardiomegaly and uncoiling of the aorta. Vascular pattern normal. Mild central bronchitic change is stable. Areas of discoid atelectasis have improved. No consolidation or effusion.  IMPRESSION: No evidence of pulmonary edema currently. Vascular congestion with mild bronchitic change. Bilateral infiltrates seen on the prior study have essentially resolved and may have been due to either pneumonia or atelectasis.   Electronically Signed   By: Esperanza Heiraymond  Rubner M.D.   On: 11/08/2013 20:07    Scheduled Meds: . amLODipine  5 mg Oral QHS  . aspirin EC  81 mg Oral Daily  . carvedilol  3.125 mg Oral BID WC  . cinacalcet  60 mg Oral Q breakfast  . FLUoxetine  20 mg Oral Daily  . heparin  5,000 Units Subcutaneous Q8H  . lamiVUDine  50 mg Oral Daily  . pantoprazole  40 mg Oral Daily   . raltegravir  400 mg Oral BID  . sevelamer carbonate  2,400 mg Oral TID WC  . sodium chloride  3 mL Intravenous Q12H  . [START ON 11/14/2013] tenofovir  300 mg Oral Q Fri   Continuous Infusions:   Principal Problem:   Hyperkalemia Active Problems:   HIV infection   ESRD (end stage renal disease)   Hypertension   Pulmonary edema, acute   Chest pain    Time spent: 35 minutes. Greater than 50% of this time was spent in direct contact with the patient coordinating care.    Chaya JanHERNANDEZ ACOSTA,ESTELA  [facility] Pager [phone]  If 7PM-7AM, please contact night-coverage at [email or web address], password [email or web address] 11/09/2013, 2:24 PM  LOS: 1 day

## 2013-11-10 MED ORDER — OXYCODONE-ACETAMINOPHEN 5-325 MG PO TABS
1.0000 | ORAL_TABLET | Freq: Four times a day (QID) | ORAL | Status: DC | PRN
  Administered 2013-11-10 – 2013-11-12 (×5): 1 via ORAL
  Filled 2013-11-10 (×5): qty 1

## 2013-11-10 MED ORDER — OXYCODONE HCL 5 MG PO TABS
5.0000 mg | ORAL_TABLET | Freq: Four times a day (QID) | ORAL | Status: DC | PRN
  Administered 2013-11-10: 5 mg via ORAL
  Filled 2013-11-10: qty 1

## 2013-11-10 NOTE — Clinical Social Work Psychosocial (Signed)
Clinical Social Work Department  CLINICAL SOCIAL WORK PLACEMENT NOTE   Patient: Jean SprangMargaret Ann AlstonAccount Number: [id number] Admit date: 11/08/13 Clinical Social Worker: Genelle BalVANESSA CRAWFORD, LCSW Date/time: 10/24/2013 05:27 AM  Clinical Social Work is seeking post-discharge placement for this patient at the following level of care: SKILLED NURSING (*CSW will update this form in Epic as items are completed)  1/31/15[facility]/family provided with [facility] of Clinical Social Work's list of facilities offering this level of care within the geographic area requested by the patient (or if unable, by the patient's family).  11/08/13 Patient/family informed of their freedom to choose among providers that offer the needed level of care, that participate in Medicare, Medicaid or managed care program needed by the patient, have an available bed and are willing to accept the patient.  Patient/family informed of [facility]' ownership interest in [facility], as well as of the fact that they are under no obligation to receive care at this facility.  PASARR submitted to EDS on 10/23/2013  PASARR number received from EDS on 10/24/2013  FL2 transmitted to all facilities in geographic area requested by pt/family on 11/08/13 FL2 transmitted to all facilities within larger geographic area on  Patient informed that his/her managed care company has contracts with or will negotiate with certain facilities, including the following:  Patient/family informed of bed offers received:  Patient chooses bed at Physician recommends and patient chooses bed at  Patient to be transferred to a  Patient to be transferred to  The following physician request were entered in Epic:  Additional Comments:

## 2013-11-10 NOTE — Progress Notes (Signed)
[facility] PROGRESS NOTE  [name] WGN:[id number] DOB: May 02, 1951 DOA: 11/08/2013 PCP: No PCP Per Patient  Assessment/Plan: Chest Pain -Resolved. -Refuses further work up. Does not want any further blood work.  Hyperkalemia -Due to missed HD. -Resolved. 3.7 2/1.  HIV -Continue HAART therapy.  HTN -Continue home meds.  Social Issues -She is homeless. -Will request SW evaluation, PT/OT evals to see if she can be placed.   Code Status: Full Code Family Communication: Patient only  Disposition Plan: To be determined; likely SNF   Consultants:  Renal   Antibiotics:  None   Subjective: No complaints.  Objective: Filed Vitals:   11/09/13 1642 11/09/13 2104 11/10/13 0532 11/10/13 1427  BP: 138/74 144/72 151/85 145/76  Pulse: 90 90 95 79  Temp: 98.4 F (36.9 C) 98.5 F (36.9 C) 98.1 F (36.7 C) 98.4 F (36.9 C)  TempSrc: Oral Oral Oral Oral  Resp: 18 18 16 18   Height:      Weight:      SpO2: 97% 97% 97% 100%    Intake/Output Summary (Last 24 hours) at 11/10/13 1455 Last data filed at 11/09/13 2100  Gross per 24 hour  Intake    240 ml  Output      0 ml  Net    240 ml   Filed Weights   11/08/13 2215 11/09/13 0145  Weight: 113.8 kg (250 lb 14.1 oz) 110.95 kg (244 lb 9.6 oz)    Exam:   General:  AA Ox3  Cardiovascular: RRR  Respiratory: CTA B  Abdomen: S/NT/ND/+BS  Extremities: trace bilateral edema    Neurologic:  Non-focal  Data Reviewed: Basic Metabolic Panel:  Recent Labs Lab 11/08/13 1853 11/08/13 2004 11/09/13 0537  NA 139  --  140  K 7.3* 7.3* 3.7  CL 90*  --  93*  CO2 22  --  27  GLUCOSE 88  --  178*  BUN 70*  --  22  CREATININE 13.13*  --  6.50*  CALCIUM 9.0  --  9.3   Liver Function Tests:  Recent Labs Lab 11/09/13 0537  AST 12  ALT 6  ALKPHOS 301*  BILITOT 0.3  PROT 7.7  ALBUMIN 3.5   No results found for this basename: LIPASE, AMYLASE,  in the last 168 hours No results found for this  basename: AMMONIA,  in the last 168 hours CBC:  Recent Labs Lab 11/08/13 1853 11/09/13 0537  WBC 9.3 6.4  NEUTROABS 5.0  --   HGB 9.7* 9.4*  HCT 29.1* 28.2*  MCV 106.2* 107.2*  PLT 311 266   Cardiac Enzymes:  Recent Labs Lab 11/09/13 0537  TROPONINI <0.30   BNP (last 3 results)  Recent Labs  10/06/13 0334 10/21/13 2037 11/08/13 1903  PROBNP 31834.0* 23932.0* 42892.0*   CBG:  Recent Labs Lab 11/08/13 2045  GLUCAP 109*    Recent Results (from the past 240 hour(s))  MRSA PCR SCREENING     Status: None   Collection Time    11/09/13  2:48 AM      Result Value Range Status   MRSA by PCR NEGATIVE  NEGATIVE Final   Comment:            The GeneXpert MRSA Assay (FDA     approved for NASAL specimens     only), is one component of a     comprehensive MRSA colonization     surveillance program. It is not     intended to diagnose MRSA  infection nor to guide or     monitor treatment for     MRSA infections.     Studies: Dg Chest 2 View  11/08/2013   CLINICAL DATA:  Recent pneumonia, dialysis patient, patient depressed  EXAM: CHEST  2 VIEW  COMPARISON:  10/21/2013  FINDINGS: Stable mild cardiomegaly and uncoiling of the aorta. Vascular pattern normal. Mild central bronchitic change is stable. Areas of discoid atelectasis have improved. No consolidation or effusion.  IMPRESSION: No evidence of pulmonary edema currently. Vascular congestion with mild bronchitic change. Bilateral infiltrates seen on the prior study have essentially resolved and may have been due to either pneumonia or atelectasis.   Electronically Signed   By: Esperanza Heiraymond  Rubner M.D.   On: 11/08/2013 20:07    Scheduled Meds: . amLODipine  5 mg Oral QHS  . aspirin EC  81 mg Oral Daily  . carvedilol  3.125 mg Oral BID WC  . cinacalcet  60 mg Oral Q breakfast  . FLUoxetine  20 mg Oral Daily  . heparin  5,000 Units Subcutaneous Q8H  . lamiVUDine  50 mg Oral Daily  . pantoprazole  40 mg Oral Daily  .  raltegravir  400 mg Oral BID  . sevelamer carbonate  2,400 mg Oral TID WC  . sodium chloride  3 mL Intravenous Q12H  . [START ON 11/14/2013] tenofovir  300 mg Oral Q Fri   Continuous Infusions:   Principal Problem:   Hyperkalemia Active Problems:   HIV infection   ESRD (end stage renal disease)   Hypertension   Pulmonary edema, acute   Chest pain    Time spent: 25 minutes. Greater than 50% of this time was spent in direct contact with the patient coordinating care.    Chaya JanHERNANDEZ ACOSTA,ESTELA  [facility] Pager [phone]  If 7PM-7AM, please contact night-coverage at [email or web address], password [email or web address] 11/10/2013, 2:55 PM  LOS: 2 days

## 2013-11-10 NOTE — Progress Notes (Signed)
UR Completed Raquon Milledge Graves-Bigelow, RN,BSN [phone]  

## 2013-11-10 NOTE — Progress Notes (Signed)
11/10/2013 11:08 AM  Pt's motorized wheelchair and walker were delivered to [address] and left under the supervision of charge RN.  Currently, items are being sanitized with bleach wipe and in the process of being returned to the patient per their staff.   Theadora RamaKIRKMAN, Erma Joubert Brooke

## 2013-11-10 NOTE — Care Management Note (Signed)
    Page 1 of 1   11/12/2013     11:35:38 AM   CARE MANAGEMENT NOTE 11/12/2013  Patient:  [name],[name]   Account Number:  [id number]  Date Initiated:  11/10/2013  Documentation initiated by:  GRAVES-BIGELOW,Katye Valek  Subjective/Objective Assessment:   Pt admitted for Chest pain, shortness of breath and increased Potassium. [facility]     Action/Plan:   CM to continue to monitor for disposition needs.   Anticipated DC Date:  11/11/2013   Anticipated DC Plan:  SKILLED NURSING FACILITY  In-house referral  Clinical Social Worker      DC Planning Services  CM consult      Choice offered to / List presented to:             Status of service:  Completed, signed off Medicare Important Message given?   (If response is "NO", the following Medicare IM given date fields will be blank) Date Medicare IM given:   Date Additional Medicare IM given:    Discharge Disposition:  HOME/SELF CARE  Per UR Regulation:  Reviewed for med. necessity/level of care/duration of stay  If discussed at Long Length of Stay Meetings, dates discussed:    Comments:  11-12-13 Pt admitted for Chest pain, shortness of breath and increased Potassium- Plan for d/c today to a homeless shelter. CSW to give resources. No further needs. SNF's refusing pt d/t she continues to smoke. Tomi BambergerBrenda Graves-Bigelow, RN,BSN [phone]

## 2013-11-10 NOTE — Clinical Documentation Improvement (Signed)
Please clarify and document in the progress notes and discharge summary if the diagnosis of "Acute Pulmonary Edema":   - IS applicable to this admission and present on admission   - IS NOT applicable to this admission   - Unable to Clinically Determine  Supporting Information:   - Does not appear to be volume overloaded is documented in the current medical record.   - CXR this admission 11/08/2013 IMPRESSION:  No evidence of pulmonary edema currently. Vascular congestion with  mild bronchitic change. Bilateral infiltrates seen on the prior  study have essentially resolved and may have been due to either  pneumonia or atelectasis.   Thank You,  Jerral Ralphathy R Pamela Intrieri ,RN BSN CCDS Certified  Clinical Documentation Specialist:  [phone] [facility]- Health Information Management

## 2013-11-10 NOTE — Evaluation (Signed)
Occupational Therapy Evaluation Patient Details Name: Jean CooleyMargaret Ann Tedesco MRN: [id number] DOB: 06/25/1951 Today's Date: 11/10/2013 Time: [id number] OT Time Calculation (min): 25 min  OT Assessment / Plan / Recommendation History of present illness [name] is a 63 y.o. female with Past medical history of ESRD, HIV, CHF, hypertension, noncompliance. The patient presented with complaints of shortness of breath and chest pain. She mentions that she has been trying to find stable housing for herself due to which she was not able to go for hemodialysis session Saturday. Today she started having numbness of chest pain which was located on the left side without any radiation. This was also started with shortness of breath and cough. She took some nitroglycerin which resolved the chest tightness.   Clinical Impression   Pt currently demonstrates deficits in her ability to independently perform ADL and functional transfer/mobility tasks (see problem list below) and should benefit from acute OT to assist in maximizing independence in these areas. Recommend SNF/ALF as pt does not have consistent assist PRN in home environment.    OT Assessment  Patient needs continued OT Services    Follow Up Recommendations  SNF;Supervision/Assistance - 24 hour;Supervision - Intermittent    Barriers to Discharge      Equipment Recommendations  Other (comment) (Defer to next venue)    Recommendations for Other Services    Frequency  Min 2X/week    Precautions / Restrictions Precautions Precautions: Fall Precaution Comments: Pt reports she had bed bugs recently but [facility]; RN made aware  Restrictions Weight Bearing Restrictions: No   Pertinent Vitals/Pain Pt reports RLE hip pain, but did not rate. Resting in bed at conclusion of session, bed alarm on.    ADL  Eating/Feeding: Performed;Independent Where Assessed - Eating/Feeding: Bed level Grooming:  Performed;Wash/dry hands;Wash/dry face;Set up (Sitting EOB) Where Assessed - Grooming: Unsupported sitting Upper Body Bathing: Simulated;Set up Where Assessed - Upper Body Bathing: Unsupported sitting Lower Body Bathing: Simulated;Moderate assistance Where Assessed - Lower Body Bathing: Supported sit to stand Upper Body Dressing: Simulated;Set up Where Assessed - Upper Body Dressing: Unsupported sitting Lower Body Dressing: Performed;Minimal assistance;Moderate assistance Where Assessed - Lower Body Dressing: Supported sit to Pharmacist, hospitalstand Toilet Transfer: Performed;Minimal Dentistassistance Toilet Transfer Method: Sit to stand;Stand pivot AcupuncturistToilet Transfer Equipment: Bedside commode (Needs wide 3:1) Toileting - Clothing Manipulation and Hygiene: Performed;Minimal assistance Where Assessed - Engineer, miningToileting Clothing Manipulation and Hygiene: Sit to stand from 3-in-1 or toilet Tub/Shower Transfer Method: Not assessed Equipment Used: Rolling walker;Gait belt Transfers/Ambulation Related to ADLs: Pt overall Min-Mod A sit to stand and SPT using RW. VC's for safety, hand placement ADL Comments: Pt was educated in role of OT and participated in ADL retraining session for LB dressing, grooming, toileting and functional transfers. Pt will benefit from acute OT followed by SNF or ALF as able.    OT Diagnosis: Generalized weakness;Acute pain  OT Problem List: Decreased activity tolerance;Impaired balance (sitting and/or standing);Decreased knowledge of precautions;Decreased knowledge of use of DME or AE;Pain OT Treatment Interventions: Self-care/ADL training;DME and/or AE instruction;Patient/family education;Energy conservation;Therapeutic activities;Balance training   OT Goals(Current goals can be found in the care plan section) Acute Rehab OT Goals Patient Stated Goal: To go to SNF/ALF as able for stable home living situation Time For Goal Achievement: 11/24/13 Potential to Achieve Goals: Good  Visit Information   Last OT Received On: 11/10/13 Assistance Needed: +1 History of Present Illness: [name] is a 63 y.o. female with Past medical history  of ESRD, HIV, CHF, hypertension, noncompliance. The patient presented with complaints of shortness of breath and chest pain. She mentions that she has been trying to find stable housing for herself due to which she was not able to go for hemodialysis session Saturday. Today she started having numbness of chest pain which was located on the left side without any radiation. This was also started with shortness of breath and cough. She took some nitroglycerin which resolved the chest tightness.       Prior Functioning     Home Living Family/patient expects to be discharged to:: Assisted living Living Arrangements: Non-relatives/Friends Home Equipment: Wheelchair - power;Walker - 2 wheels Additional Comments: pt lives in trailer with friends; reports she has been having increased difficulty getting around house and performing ADLs; pt has roommates help her but not all day; rommates work during the day  Prior Function Level of Independence: Needs assistance Gait / Transfers Assistance Needed: uses electric wheelchair predominately; has difficulty negotiating steps at home; require (A) to ambulate up/down steps  ADL's / Homemaking Assistance Needed: requires (A) for washing back side and donning socks/shoes at times; reports she does not always have 24/7 (A)  Comments: pt reports she has left her power wheelchair and RW on 6th floor Communication Communication: No difficulties Dominant Hand: Right    Vision/Perception Vision - History Baseline Vision: Wears glasses all the time Patient Visual Report: No change from baseline   Cognition  Cognition Arousal/Alertness: Awake/alert Behavior During Therapy: WFL for tasks assessed/performed Overall Cognitive Status: Within Functional Limits for tasks assessed    Extremity/Trunk Assessment Upper  Extremity Assessment Upper Extremity Assessment: Overall WFL for tasks assessed Lower Extremity Assessment Lower Extremity Assessment: Defer to PT evaluation Cervical / Trunk Assessment Cervical / Trunk Assessment: Kyphotic    Mobility Bed Mobility Overal bed mobility: Modified Independent Bed Mobility: Supine to Sit Supine to sit: HOB elevated;Modified independent (Device/Increase time) Sit to supine: Mod assist General bed mobility comments: Effortfull, needs encouragement but no physical (A) needed; relies on HOB elevated and handrails Transfers Overall transfer level: Needs assistance Equipment used: Rolling walker (2 wheeled) Transfers: Sit to/from [employer] Sit to Stand: Min guard Stand pivot transfers: Min guard;From elevated surface General transfer comment: Cues for hand placement and sequencing with RW & to stand upright, pt tends to keep fwd flexed trunk; unsteady with transfers; limited by pain in R LE         Balance Balance Overall balance assessment: Needs assistance Sitting-balance support: Bilateral upper extremity supported;Feet supported Sitting balance-Leahy Scale: Good Postural control: Other (comment) (Anterior lean) Standing balance support: Bilateral upper extremity supported Standing balance-Leahy Scale: Fair Standing balance comment: RW   End of Session OT - End of Session Equipment Utilized During Treatment: Rolling walker Activity Tolerance: Patient limited by pain;Patient limited by fatigue Patient left: in bed;with call bell/phone within reach;with bed alarm set  GO     Roselie Awkward Dixon 11/10/2013, 1:08 PM

## 2013-11-10 NOTE — Progress Notes (Signed)
Admit: 11/08/2013 LOS: 2  3F [facility] with hyperkal[facility], chest pain  Subjective:  No complaints Having occ leg pains   02/01 0701 - 02/02 0700 In: 720 [P.O.:720] Out: -   Filed Weights   11/08/13 2215 11/09/13 0145  Weight: 113.8 kg (250 lb 14.1 oz) 110.95 kg (244 lb 9.6 oz)    Current meds: reviewed  Current Labs: reviewed    Physical Exam:  Blood pressure 145/76, pulse 79, temperature 98.4 F (36.9 C), temperature source Oral, resp. rate 18, height 5\' 4"  (1.626 m), weight 110.95 kg (244 lb 9.6 oz), SpO2 100.00%.  Assessment/Plan 1. ESRD: keep on TTS schedule.  Will [facility].  2. Hyperkalemia: resolved 3. 2HPTH: cont sensiapar, Renvela 4. HIV: on ARVs 5. Anemia: Stable  Sabra Heckyan Princesa Willig MD 11/10/2013, 4:11 PM   Recent Labs Lab 11/08/13 1853 11/08/13 2004 11/09/13 0537  NA 139  --  140  K 7.3* 7.3* 3.7  CL 90*  --  93*  CO2 22  --  27  GLUCOSE 88  --  178*  BUN 70*  --  22  CREATININE 13.13*  --  6.50*  CALCIUM 9.0  --  9.3    Recent Labs Lab 11/08/13 1853 11/09/13 0537  WBC 9.3 6.4  NEUTROABS 5.0  --   HGB 9.7* 9.4*  HCT 29.1* 28.2*  MCV 106.2* 107.2*  PLT 311 266

## 2013-11-11 LAB — RENAL FUNCTION PANEL
Albumin: 3.4 g/dL — ABNORMAL LOW (ref 3.5–5.2)
BUN: 43 mg/dL — ABNORMAL HIGH (ref 6–23)
CO2: 26 meq/L (ref 19–32)
Calcium: 7.9 mg/dL — ABNORMAL LOW (ref 8.4–10.5)
Chloride: 88 mEq/L — ABNORMAL LOW (ref 96–112)
Creatinine, Ser: 11.13 mg/dL — ABNORMAL HIGH (ref 0.50–1.10)
GFR, EST AFRICAN AMERICAN: 4 mL/min — AB (ref >90)
GFR, EST NON AFRICAN AMERICAN: 3 mL/min — AB (ref >90)
GLUCOSE: 97 mg/dL (ref 70–99)
PHOSPHORUS: 5 mg/dL — AB (ref 2.3–4.6)
Potassium: 5.6 mEq/L — ABNORMAL HIGH (ref 3.7–5.3)
SODIUM: 133 meq/L — AB (ref 137–147)

## 2013-11-11 LAB — CBC
HCT: 26.7 % — ABNORMAL LOW (ref 36.0–46.0)
Hemoglobin: 8.6 g/dL — ABNORMAL LOW (ref 12.0–15.0)
MCH: 34.7 pg — ABNORMAL HIGH (ref 26.0–34.0)
MCHC: 32.2 g/dL (ref 30.0–36.0)
MCV: 107.7 fL — AB (ref 78.0–100.0)
PLATELETS: 263 10*3/uL (ref 150–400)
RBC: 2.48 MIL/uL — ABNORMAL LOW (ref 3.87–5.11)
RDW: 14.3 % (ref 11.5–15.5)
WBC: 7.3 10*3/uL (ref 4.0–10.5)

## 2013-11-11 NOTE — Progress Notes (Signed)
Client in dialysis at this time.

## 2013-11-11 NOTE — Procedures (Signed)
I was present at this dialysis session. I have reviewed the session itself and made appropriate changes.   Sabra Heckyan Senay Sistrunk  MD 11/11/2013, 9:25 AM

## 2013-11-11 NOTE — Progress Notes (Signed)
Patient is refusing SNF and ALF placement is not looking possible. Patient is refusing SNF because she can't smoke at the facility. Thanks, Sabino NiemannAmy Duyen Beckom, MSW, [place]CSWA [phone]

## 2013-11-11 NOTE — Progress Notes (Signed)
[facility] PROGRESS NOTE  [name] ZOX:[id number] DOB: 18-May-1951 DOA: 11/08/2013 PCP: No PCP Per Patient  Assessment/Plan: Chest Pain -Resolved. -Refuses further work up. Does not want any further blood work.  Hyperkalemia -Due to missed HD. -Resolved. 3.7 2/1.  HIV -Continue HAART therapy.  HTN -Continue home meds.  Social Issues -She is homeless. -Will request SW evaluation, PT/OT evals to see if she can be placed.   Code Status: Full Code Family Communication: Patient only  Disposition Plan: To be determined; likely SNF   Consultants:  Renal   Antibiotics:  None   Subjective: No complaints.  Objective: Filed Vitals:   11/11/13 1030 11/11/13 1100 11/11/13 1120 11/11/13 1201  BP: 172/69 164/86 166/87 161/59  Pulse: 81 83 83 87  Temp:   98.1 F (36.7 C) 98.4 F (36.9 C)  TempSrc:   Oral Oral  Resp: 12 12 12 15   Height:      Weight:   111.2 kg (245 lb 2.4 oz)   SpO2:    96%    Intake/Output Summary (Last 24 hours) at 11/11/13 1415 Last data filed at 11/11/13 1120  Gross per 24 hour  Intake      0 ml  Output   3000 ml  Net  -3000 ml   Filed Weights   11/09/13 0145 11/11/13 0706 11/11/13 1120  Weight: 110.95 kg (244 lb 9.6 oz) 115.5 kg (254 lb 10.1 oz) 111.2 kg (245 lb 2.4 oz)    Exam:   General:  AA Ox3  Cardiovascular: RRR  Respiratory: CTA B  Abdomen: S/NT/ND/+BS  Extremities: trace bilateral edema    Neurologic:  Non-focal  Data Reviewed: Basic Metabolic Panel:  Recent Labs Lab 11/08/13 1853 11/08/13 2004 11/09/13 0537 11/11/13 0830  NA 139  --  140 133*  K 7.3* 7.3* 3.7 5.6*  CL 90*  --  93* 88*  CO2 22  --  27 26  GLUCOSE 88  --  178* 97  BUN 70*  --  22 43*  CREATININE 13.13*  --  6.50* 11.13*  CALCIUM 9.0  --  9.3 7.9*  PHOS  --   --   --  5.0*   Liver Function Tests:  Recent Labs Lab 11/09/13 0537 11/11/13 0830  AST 12  --   ALT 6  --   ALKPHOS 301*  --   BILITOT 0.3  --   PROT  7.7  --   ALBUMIN 3.5 3.4*   No results found for this basename: LIPASE, AMYLASE,  in the last 168 hours No results found for this basename: AMMONIA,  in the last 168 hours CBC:  Recent Labs Lab 11/08/13 1853 11/09/13 0537 11/11/13 0830  WBC 9.3 6.4 7.3  NEUTROABS 5.0  --   --   HGB 9.7* 9.4* 8.6*  HCT 29.1* 28.2* 26.7*  MCV 106.2* 107.2* 107.7*  PLT 311 266 263   Cardiac Enzymes:  Recent Labs Lab 11/09/13 0537  TROPONINI <0.30   BNP (last 3 results)  Recent Labs  10/06/13 0334 10/21/13 2037 11/08/13 1903  PROBNP 31834.0* 23932.0* 42892.0*   CBG:  Recent Labs Lab 11/08/13 2045  GLUCAP 109*    Recent Results (from the past 240 hour(s))  MRSA PCR SCREENING     Status: None   Collection Time    11/09/13  2:48 AM      Result Value Range Status   MRSA by PCR NEGATIVE  NEGATIVE Final   Comment:  The GeneXpert MRSA Assay (FDA     approved for NASAL specimens     only), is one component of a     comprehensive MRSA colonization     surveillance program. It is not     intended to diagnose MRSA     infection nor to guide or     monitor treatment for     MRSA infections.     Studies: No results found.  Scheduled Meds: . amLODipine  5 mg Oral QHS  . aspirin EC  81 mg Oral Daily  . carvedilol  3.125 mg Oral BID WC  . cinacalcet  60 mg Oral Q breakfast  . FLUoxetine  20 mg Oral Daily  . heparin  5,000 Units Subcutaneous Q8H  . lamiVUDine  50 mg Oral Daily  . pantoprazole  40 mg Oral Daily  . raltegravir  400 mg Oral BID  . sevelamer carbonate  2,400 mg Oral TID WC  . sodium chloride  3 mL Intravenous Q12H  . [START ON 11/14/2013] tenofovir  300 mg Oral Q Fri   Continuous Infusions:   Principal Problem:   Hyperkalemia Active Problems:   HIV infection   ESRD (end stage renal disease)   Hypertension   Pulmonary edema, acute   Chest pain    Time spent: 25 minutes. Greater than 50% of this time was spent in direct contact with the patient  coordinating care.    Chaya Jan  [facility] Pager [phone]  If 7PM-7AM, please contact night-coverage at [email or web address], password [facility] 11/11/2013, 2:15 PM  LOS: 3 days

## 2013-11-12 ENCOUNTER — Encounter (HOSPITAL_COMMUNITY): Payer: Self-pay | Admitting: Emergency Medicine

## 2013-11-12 ENCOUNTER — Observation Stay (HOSPITAL_COMMUNITY)
Admission: EM | Admit: 2013-11-12 | Discharge: 2013-11-18 | Disposition: A | Discharge status: Skilled Nursing Facility | Payer: Medicare Other | Attending: Internal Medicine | Admitting: Internal Medicine

## 2013-11-12 ENCOUNTER — Inpatient Hospital Stay (HOSPITAL_COMMUNITY): Payer: Medicare Other

## 2013-11-12 ENCOUNTER — Inpatient Hospital Stay: Payer: Self-pay | Admitting: Infectious Diseases

## 2013-11-12 DIAGNOSIS — Z992 Dependence on renal dialysis: Secondary | ICD-10-CM | POA: Insufficient documentation

## 2013-11-12 DIAGNOSIS — Z5900 Homelessness unspecified: Secondary | ICD-10-CM | POA: Insufficient documentation

## 2013-11-12 DIAGNOSIS — J811 Chronic pulmonary edema: Secondary | ICD-10-CM

## 2013-11-12 DIAGNOSIS — R0602 Shortness of breath: Secondary | ICD-10-CM

## 2013-11-12 DIAGNOSIS — Z21 Asymptomatic human immunodeficiency virus [HIV] infection status: Secondary | ICD-10-CM | POA: Insufficient documentation

## 2013-11-12 DIAGNOSIS — N189 Chronic kidney disease, unspecified: Secondary | ICD-10-CM

## 2013-11-12 DIAGNOSIS — M25562 Pain in left knee: Secondary | ICD-10-CM

## 2013-11-12 DIAGNOSIS — N186 End stage renal disease: Secondary | ICD-10-CM | POA: Insufficient documentation

## 2013-11-12 DIAGNOSIS — R079 Chest pain, unspecified: Principal | ICD-10-CM | POA: Insufficient documentation

## 2013-11-12 DIAGNOSIS — I120 Hypertensive chronic kidney disease with stage 5 chronic kidney disease or end stage renal disease: Secondary | ICD-10-CM | POA: Insufficient documentation

## 2013-11-12 DIAGNOSIS — F17200 Nicotine dependence, unspecified, uncomplicated: Secondary | ICD-10-CM | POA: Insufficient documentation

## 2013-11-12 DIAGNOSIS — E875 Hyperkalemia: Secondary | ICD-10-CM

## 2013-11-12 DIAGNOSIS — D649 Anemia, unspecified: Secondary | ICD-10-CM

## 2013-11-12 DIAGNOSIS — J810 Acute pulmonary edema: Secondary | ICD-10-CM

## 2013-11-12 DIAGNOSIS — I10 Essential (primary) hypertension: Secondary | ICD-10-CM

## 2013-11-12 DIAGNOSIS — W19XXXA Unspecified fall, initial encounter: Secondary | ICD-10-CM

## 2013-11-12 DIAGNOSIS — B20 Human immunodeficiency virus [HIV] disease: Secondary | ICD-10-CM | POA: Diagnosis present

## 2013-11-12 DIAGNOSIS — Y92009 Unspecified place in unspecified non-institutional (private) residence as the place of occurrence of the external cause: Secondary | ICD-10-CM

## 2013-11-12 DIAGNOSIS — I509 Heart failure, unspecified: Secondary | ICD-10-CM

## 2013-11-12 DIAGNOSIS — Z720 Tobacco use: Secondary | ICD-10-CM

## 2013-11-12 DIAGNOSIS — M25561 Pain in right knee: Secondary | ICD-10-CM

## 2013-11-12 LAB — BASIC METABOLIC PANEL
BUN: 31 mg/dL — ABNORMAL HIGH (ref 6–23)
CHLORIDE: 92 meq/L — AB (ref 96–112)
CO2: 23 meq/L (ref 19–32)
CREATININE: 7.95 mg/dL — AB (ref 0.50–1.10)
Calcium: 8.3 mg/dL — ABNORMAL LOW (ref 8.4–10.5)
GFR calc Af Amer: 6 mL/min — ABNORMAL LOW (ref >90)
GFR calc non Af Amer: 5 mL/min — ABNORMAL LOW (ref >90)
GLUCOSE: 82 mg/dL (ref 70–99)
Potassium: 6.6 mEq/L (ref 3.7–5.3)
Sodium: 134 mEq/L — ABNORMAL LOW (ref 137–147)

## 2013-11-12 LAB — CBC
HCT: 30.7 % — ABNORMAL LOW (ref 36.0–46.0)
Hemoglobin: 10 g/dL — ABNORMAL LOW (ref 12.0–15.0)
MCH: 35.3 pg — ABNORMAL HIGH (ref 26.0–34.0)
MCHC: 32.6 g/dL (ref 30.0–36.0)
MCV: 108.5 fL — ABNORMAL HIGH (ref 78.0–100.0)
PLATELETS: 289 10*3/uL (ref 150–400)
RBC: 2.83 MIL/uL — AB (ref 3.87–5.11)
RDW: 14.3 % (ref 11.5–15.5)
WBC: 7.6 10*3/uL (ref 4.0–10.5)

## 2013-11-12 LAB — POCT I-STAT TROPONIN I: TROPONIN I, POC: 0.02 ng/mL (ref 0.00–0.08)

## 2013-11-12 MED ORDER — ASPIRIN 81 MG PO TBEC: 81.0000 mg | DELAYED_RELEASE_TABLET | Freq: Every day | ORAL | Status: DC

## 2013-11-12 MED ORDER — SODIUM POLYSTYRENE SULFONATE 15 GM/60ML PO SUSP
30.0000 g | Freq: Once | ORAL | Status: AC
  Administered 2013-11-12: 30 g via ORAL
  Filled 2013-11-12: qty 120

## 2013-11-12 MED ORDER — SORBITOL 70 % SOLN
30.0000 mL | Freq: Every day | Status: DC | PRN
  Filled 2013-11-12: qty 30

## 2013-11-12 MED ORDER — BISACODYL 10 MG RE SUPP
10.0000 mg | Freq: Once | RECTAL | Status: DC
  Filled 2013-11-12: qty 1

## 2013-11-12 NOTE — ED Provider Notes (Signed)
CSN: [id number]     Arrival date & time 11/12/13  1922 History  First MD Initiated Contact with Patient 11/12/13 1935     Chief Complaint  Patient presents with  . Chest Pain    HPI Patient presents to the emergency room after having an episode of chest pain.  Patient states that just discharged from the hospital today. The patient has history of chronic renal failure, hypertension HIV disease. Patient last received dialysis yesterday. She did have chest pain with her hospitalization but ruled out for any acute cardiac event.  The patient is homeless and was referred to a homeless shelter. However when she got there, the patient was told they did not have a bed for her and she was going to have to sleep on the floor. He came very upset and she started developing chest pain. Patient states all her symptoms have resolved this point. She wants to talk to a Child psychotherapist in order to get another living facility.  Past Medical History  Diagnosis Date  . Hypertension   . HIV infection   . Chronic renal failure   . CHF (congestive heart failure)   . ESRD (end stage renal disease) on dialysis 09/30/2013    On dialysis at [facility], per [facility] physicians.  Started dialysis in 2008.  Takes HD MWF schedule.     Past Surgical History  Procedure Laterality Date  . Arteriovenous graft placement     History reviewed. No pertinent family history. History  Substance Use Topics  . Smoking status: Current Every Day Smoker -- 0.50 packs/day  . Smokeless tobacco: Not on file  . Alcohol Use: Yes   OB History   Grav Para Term Preterm Abortions TAB SAB Ect Mult Living                 Review of Systems  All other systems reviewed and are negative.    Allergies  Tramadol and Morphine and related  Home Medications   Current Outpatient Rx  Name  Route  Sig  Dispense  Refill  . amLODipine (NORVASC) 5 MG tablet   Oral   Take 1 tablet (5 mg total) by mouth at bedtime.   30 tablet   0   . aspirin EC  81 MG EC tablet   Oral   Take 1 tablet (81 mg total) by mouth daily.   30 tablet   0   . carvedilol (COREG) 3.125 MG tablet   Oral   Take 1 tablet (3.125 mg total) by mouth 2 (two) times daily with a meal.   60 tablet   0   . cinacalcet (SENSIPAR) 60 MG tablet   Oral   Take 60 mg by mouth daily.         . diphenhydrAMINE (BENADRYL) 50 MG capsule   Oral   Take 1 capsule (50 mg total) by mouth at bedtime.   30 capsule   0   . FLUoxetine (PROZAC) 20 MG capsule   Oral   Take 1 capsule (20 mg total) by mouth daily.   30 capsule   0   . hydrOXYzine (ATARAX/VISTARIL) 25 MG tablet   Oral   Take 1 tablet (25 mg total) by mouth 3 (three) times daily.   45 tablet   0   . lamiVUDine (EPIVIR) 10 MG/ML solution   Oral   Take 5 mLs (50 mg total) by mouth daily.   240 mL   0   . multivitamin (RENA-VIT)  TABS tablet   Oral   Take 1 tablet by mouth at bedtime.   30 tablet   0   . omeprazole (PRILOSEC) 20 MG capsule   Oral   Take 20 mg by mouth daily.         . raltegravir (ISENTRESS) 400 MG tablet   Oral   Take 400 mg by mouth 2 (two) times daily.          . sevelamer carbonate (RENVELA) 800 MG tablet   Oral   Take 1,600-2,400 mg by mouth 5 (five) times daily. 3 Tabs with meals and 2 tabs with snacks         . tenofovir (VIREAD) 300 MG tablet   Oral   Take 300 mg by mouth every Friday. Takes on Fridays.          BP 150/70  Pulse 82  Temp(Src) 97.8 F (36.6 C) (Oral)  Resp 16  Ht 5\' 4"  (1.626 m)  Wt 250 lb (113.399 kg)  BMI 42.89 kg/m2  SpO2 98% Physical Exam  Nursing note and vitals reviewed. Constitutional: She appears well-developed and well-nourished. No distress.  HENT:  Head: Normocephalic and atraumatic.  Right Ear: External ear normal.  Left Ear: External ear normal.  Eyes: Conjunctivae are normal. Right eye exhibits no discharge. Left eye exhibits no discharge. No scleral icterus.  Neck: Neck supple. No tracheal deviation present.   Cardiovascular: Normal rate, regular rhythm and intact distal pulses.   Pulmonary/Chest: Effort normal and breath sounds normal. No stridor. No respiratory distress. She has no wheezes. She has no rales.  Abdominal: Soft. Bowel sounds are normal. She exhibits no distension. There is no tenderness. There is no rebound and no guarding.  Musculoskeletal: She exhibits no edema and no tenderness.  Neurological: She is alert. She has normal strength. No cranial nerve deficit (no facial droop, extraocular movements intact, no slurred speech) or sensory deficit. She exhibits normal muscle tone. She displays no seizure activity. Coordination normal.  Skin: Skin is warm and dry. No rash noted.  Psychiatric: She has a normal mood and affect.    ED Course  Procedures (including critical care time) Labs Review Labs Reviewed  CBC - Abnormal; Notable for the following:    RBC 2.83 (*)    Hemoglobin 10.0 (*)    HCT 30.7 (*)    MCV 108.5 (*)    MCH 35.3 (*)    All other components within normal limits  BASIC METABOLIC PANEL - Abnormal; Notable for the following:    Sodium 134 (*)    Potassium 6.6 (*)    Chloride 92 (*)    BUN 31 (*)    Creatinine, Ser 7.95 (*)    Calcium 8.3 (*)    GFR calc non Af Amer 5 (*)    GFR calc Af Amer 6 (*)    All other components within normal limits  POCT I-STAT TROPONIN I   Imaging Review Dg Abd Portable 1v  11/12/2013   CLINICAL DATA:  Abdominal distention.  Constipation.  EXAM: PORTABLE ABDOMEN - 1 VIEW  COMPARISON:  DG ABDOMEN ACUTE 2V W/ 1V CHEST dated 02/11/2012; DG HIP COMPLETE 2+V*R* dated 10/12/2012  FINDINGS: There is prominent formed stool throughout the colon. No dilated small bowel is observed.  There is prominent degenerative arthropathy of both hips. Possible flattening of the right femoral head.  IMPRESSION: 1.  Prominent stool throughout the colon favors constipation. 2. Indistinctness of the right hip joint. There is considerable degenerative  arthropathy  of both hips. Possible flattening of the right femoral head.   Electronically Signed   By: Herbie Baltimore M.D.   On: 11/12/2013 13:17    EKG Normal sinus rhythm rate 81 Normal axis, normal intervals ST-T waves No prior EKG for comparison * MUSE/EPIC HYPERLINK NOT AVAILABLE  MDM   1. Chronic renal failure   2. Hyperkalemia   3. Homelessness     The patient was discharged from the hospital today according to the social worker the patient did not have appropriate arrangements made for her. They did not arrange for a shelter with the bed. They also did not set up transportation for her to go to dialysis. The patient is scheduled to have dialysis tomorrow. She has no means to have that done. She is hyperkalemic without any EKG changes.  She did have an episode of chest pain but that has resolved at this point. I doubt acute cardiac ischemia. We'll consult the medical service regarding admission for observation so the patient could have a more appropriate disposition.   Celene Kras, MD 11/12/13 2154

## 2013-11-12 NOTE — Progress Notes (Signed)
Admit: 11/08/2013 LOS: 4  83F [facility] with hyperkalemia, chest pain  Subjective:  HD yesterday, 3L UF  Tenative planning for dc to shelter today, put doesn't know how she will travel to/from HD  02/03 0701 - 02/04 0700 In: 595 [P.O.:595] Out: 3000   Filed Weights   11/11/13 0706 11/11/13 1120 11/12/13 0622  Weight: 115.5 kg (254 lb 10.1 oz) 111.2 kg (245 lb 2.4 oz) 113.717 kg (250 lb 11.2 oz)    Current meds: reviewed  Current Labs: reviewed    Physical Exam:  Blood pressure 151/71, pulse 87, temperature 98.8 F (37.1 C), temperature source Oral, resp. rate 15, height 5\' 4"  (1.626 m), weight 113.717 kg (250 lb 11.2 oz), SpO2 99.00%.  Assessment/Plan 1. ESRD: keep on TTS schedule.  .Need to sort out transportation for HD.    2. Hyperkalemia: resolved 3. 2HPTH: cont sensiapar, Renvela 4. HIV: on ARVs 5. Anemia: Stable  Sabra Heckyan Sharday Michl MD 11/12/2013, 12:52 PM   Recent Labs Lab 11/08/13 1853 11/08/13 2004 11/09/13 0537 11/11/13 0830  NA 139  --  140 133*  K 7.3* 7.3* 3.7 5.6*  CL 90*  --  93* 88*  CO2 22  --  27 26  GLUCOSE 88  --  178* 97  BUN 70*  --  22 43*  CREATININE 13.13*  --  6.50* 11.13*  CALCIUM 9.0  --  9.3 7.9*  PHOS  --   --   --  5.0*    Recent Labs Lab 11/08/13 1853 11/09/13 0537 11/11/13 0830  WBC 9.3 6.4 7.3  NEUTROABS 5.0  --   --   HGB 9.7* 9.4* 8.6*  HCT 29.1* 28.2* 26.7*  MCV 106.2* 107.2* 107.7*  PLT 311 266 263

## 2013-11-12 NOTE — H&P (Signed)
Chief Complaint:  cp  HPI: 64 yo female esrd dialysis t/r/sat, homeless, hiv was discharged earlier today to homeless shelter. Got to homeless shelter and was told she was going to have to sleep on a mattress on the floor.  She got very upset because she cannot do this, she has to have a bed higher up as she cannot get herself up from the ground and started having anxiety and sscp.  They sent her to the ED again.  Pt had refused snf placement today, but is now willing to consider that.  She thinks she had an anxiety attack today when she got so upset at the shelter.  She is feeling better now.  She is due for dialysis in the am.  No new issues since her d/c today.  Review of Systems:  Positive and negative as per HPI otherwise all other systems are negative  Past Medical History: Past Medical History  Diagnosis Date  . Hypertension   . HIV infection   . Chronic renal failure   . CHF (congestive heart failure)   . ESRD (end stage renal disease) on dialysis 09/30/2013    On dialysis at [facility], per [facility] physicians.  Started dialysis in 2008.  Takes HD MWF schedule.     Past Surgical History  Procedure Laterality Date  . Arteriovenous graft placement      Medications: Prior to Admission medications   Medication Sig Start Date End Date Taking? Authorizing Provider  amLODipine (NORVASC) 5 MG tablet Take 1 tablet (5 mg total) by mouth at bedtime. 10/28/13  Yes Elease Etienne, MD  aspirin EC 81 MG EC tablet Take 1 tablet (81 mg total) by mouth daily. 11/12/13  Yes Belkys A Regalado, MD  carvedilol (COREG) 3.125 MG tablet Take 1 tablet (3.125 mg total) by mouth 2 (two) times daily with a meal. 10/28/13  Yes Elease Etienne, MD  cinacalcet (SENSIPAR) 60 MG tablet Take 60 mg by mouth daily.   Yes Historical Provider, MD  diphenhydrAMINE (BENADRYL) 50 MG capsule Take 1 capsule (50 mg total) by mouth at bedtime. 10/28/13  Yes Elease Etienne, MD  FLUoxetine (PROZAC) 20 MG capsule Take 1  capsule (20 mg total) by mouth daily. 10/28/13  Yes Elease Etienne, MD  hydrOXYzine (ATARAX/VISTARIL) 25 MG tablet Take 1 tablet (25 mg total) by mouth 3 (three) times daily. 10/28/13  Yes Elease Etienne, MD  lamiVUDine (EPIVIR) 10 MG/ML solution Take 5 mLs (50 mg total) by mouth daily. 10/28/13  Yes Elease Etienne, MD  multivitamin (RENA-VIT) TABS tablet Take 1 tablet by mouth at bedtime. 10/28/13  Yes Elease Etienne, MD  omeprazole (PRILOSEC) 20 MG capsule Take 20 mg by mouth daily.   Yes Historical Provider, MD  raltegravir (ISENTRESS) 400 MG tablet Take 400 mg by mouth 2 (two) times daily.    Yes Historical Provider, MD  sevelamer carbonate (RENVELA) 800 MG tablet Take 1,600-2,400 mg by mouth 5 (five) times daily. 3 Tabs with meals and 2 tabs with snacks   Yes Historical Provider, MD  tenofovir (VIREAD) 300 MG tablet Take 300 mg by mouth every Friday. Takes on Fridays.   Yes Historical Provider, MD    Allergies:   Allergies  Allergen Reactions  . Tramadol     "Gives me bad dreams"  . Morphine And Related Hives and Rash    Social History:  reports that she has been smoking.  She does not have any smokeless tobacco history  on file. She reports that she drinks alcohol. She reports that she uses illicit drugs (Marijuana) about once per week.  Family History: History reviewed. No pertinent family history.  Physical Exam: Filed Vitals:   11/12/13 1922 11/12/13 1928  BP:  150/70  Pulse:  82  Temp:  97.8 F (36.6 C)  TempSrc:  Oral  Resp:  16  Height:  5\' 4"  (1.626 m)  Weight:  113.399 kg (250 lb)  SpO2: 100% 98%   General appearance: alert, cooperative and no distress Head: Normocephalic, without obvious abnormality, atraumatic Eyes: negative Nose: Nares normal. Septum midline. Mucosa normal. No drainage or sinus tenderness. Neck: no JVD and supple, symmetrical, trachea midline Lungs: clear to auscultation bilaterally Heart: regular rate and rhythm, S1, S2 normal, no  murmur, click, rub or gallop Abdomen: soft, non-tender; bowel sounds normal; no masses,  no organomegaly Extremities: extremities normal, atraumatic, no cyanosis or edema Pulses: 2+ and symmetric Skin: Skin color, texture, turgor normal. No rashes or lesions Neurologic: Grossly normal    Labs on Admission:   Recent Labs  11/11/13 0830 11/12/13 1934  NA 133* 134*  K 5.6* 6.6*  CL 88* 92*  CO2 26 23  GLUCOSE 97 82  BUN 43* 31*  CREATININE 11.13* 7.95*  CALCIUM 7.9* 8.3*  PHOS 5.0*  --     Recent Labs  11/11/13 0830  ALBUMIN 3.4*    Recent Labs  11/11/13 0830 11/12/13 1934  WBC 7.3 7.6  HGB 8.6* 10.0*  HCT 26.7* 30.7*  MCV 107.7* 108.5*  PLT 263 289    Radiological Exams on Admission: Dg Abd Portable 1v  11/12/2013   CLINICAL DATA:  Abdominal distention.  Constipation.  EXAM: PORTABLE ABDOMEN - 1 VIEW  COMPARISON:  DG ABDOMEN ACUTE 2V W/ 1V CHEST dated 02/11/2012; DG HIP COMPLETE 2+V*R* dated 10/12/2012  FINDINGS: There is prominent formed stool throughout the colon. No dilated small bowel is observed.  There is prominent degenerative arthropathy of both hips. Possible flattening of the right femoral head.  IMPRESSION: 1.  Prominent stool throughout the colon favors constipation. 2. Indistinctness of the right hip joint. There is considerable degenerative arthropathy of both hips. Possible flattening of the right femoral head.   Electronically Signed   By: Herbie BaltimoreWalt  Liebkemann M.D.   On: 11/12/2013 13:17    Assessment/Plan  63 yo female with homelessness, chest pain atypical with  Need for placeement and dialysis in am  Principal Problem:   Chest pain- obs to serial enzymes overnight.  Active Problems:   HIV infection   Hyperkalemia   ESRD (end stage renal disease) on dialysis  Left message for dialysis unit to have session in am   Homelessness  SW involved already to get her snf placement.  Cottrell Gentles A 11/12/2013, 10:11 PM

## 2013-11-12 NOTE — Progress Notes (Addendum)
Clinical Social Work Department CLINICAL SOCIAL WORK PLACEMENT NOTE 11/12/2013  Patient:  [name],[name]  Account Number:  [id number] Admit date:  11/12/2013  Clinical Social Worker:  Pollyann SavoyJODY DRAKE, LCSW  Date/time:  11/12/2013 11:22 PM  Clinical Social Work is seeking post-discharge placement for this patient at the following level of care:   SKILLED NURSING   (*CSW will update this form in Epic as items are completed)   11/12/2013  Patient/family provided with [facility] Department of Clinical Social Work's list of facilities offering this level of care within the geographic area requested by the patient (or if unable, by the patient's family).  11/12/2013  Patient/family informed of their freedom to choose among providers that offer the needed level of care, that participate in Medicare, Medicaid or managed care program needed by the patient, have an available bed and are willing to accept the patient.  11/12/2013  Patient/family informed of [facility]' ownership interest in [facility], as well as of the fact that they are under no obligation to receive care at this facility.  PASARR submitted to EDS on 10/23/13 PASARR number received from EDS on [id number] E: 10/24/2013 - 11/23/2013   FL2 transmitted to all facilities in geographic area requested by pt/family on  11/12/2013 FL2 transmitted to all facilities within larger geographic area on 11/12/2013  Patient informed that his/her managed care company has contracts with or will negotiate with  certain facilities, including the following:     Patient/family informed of bed offers received: 11/13/13  [facility] Physician recommends and patient chooses bed at    [facility]   Patient to be transferred to facility by   The following physician request were entered in Epic:   Additional Comments:

## 2013-11-12 NOTE — Discharge Summary (Signed)
Physician Discharge Summary  [name] QMV:[id number] DOB: [birth year] DOA: 11/08/2013  PCP: No PCP Per Patient  Admit date: 11/08/2013 Discharge date: 11/12/2013  Time spent: 35 minutes  Recommendations for Outpatient Follow-up:  1. Follow up with primary nephrologist, for dialysis.   Discharge Diagnoses:    Hyperkalemia, resolved.    HIV infection   ESRD (end stage renal disease)   Hypertension   Pulmonary edema, acute   Chest pain   Discharge Condition: stable.   Diet recommendation: renal diet.   Filed Weights   11/11/13 0706 11/11/13 1120 11/12/13 0622  Weight: 115.5 kg (254 lb 10.1 oz) 111.2 kg (245 lb 2.4 oz) 113.717 kg (250 lb 11.2 oz)    History of present illness:  [name] is a 63 y.o. female with Past medical history of ESRD, HIV, CHF, hypertension, noncompliance.  The patient is coming from home.  The patient presented with complaints of shortness of breath and chest pain. She mentions that she has been trying to find stable housing for herself due to which she was not able to go for hemodialysis session Saturday. Today she started having numbness of chest pain which was located on the left side without any radiation. This was also started with shortness of breath and cough. She took some nitroglycerin which resolved the chest tightness.  Pt denies any fever, chills, headache, PND, nausea, vomiting, abdominal pain, diarrhea, constipation, active bleeding, burning urination, dizziness, pedal edema, focal neurological deficit   Hospital Course:  Chest pain  -Resolved.  -Refuses further work up. Does not want any further blood work.   Hyperkalemia  -Due to missed HD.  -Resolved. 3.7 2/1.  HIV  -Continue HAART therapy.  HTN  -Continue home meds.   Constipation: Had a BM. KUB negative for obstruction.   Social Issues  -She is homeless. She has decline SNF. She [facility] to [facility] to  [facility]. SW will get her a bed at shelter and arrange  transportation for dialysis.   Procedures:  none  Consultations:  Renal   Discharge Exam: Filed Vitals:   11/12/13 0622  BP: 151/71  Pulse: 87  Temp: 98.8 F (37.1 C)  Resp:     General: no distress.  Cardiovascular: S 1, S 2 RRR Respiratory: CTA  Discharge Instructions  Discharge Orders   Future Orders Complete By Expires   Diet - low sodium heart healthy  As directed    Increase activity slowly  As directed        Medication List         amLODipine 5 MG tablet  Commonly known as:  NORVASC  Take 1 tablet (5 mg total) by mouth at bedtime.     aspirin 81 MG EC tablet  Take 1 tablet (81 mg total) by mouth daily.     carvedilol 3.125 MG tablet  Commonly known as:  COREG  Take 1 tablet (3.125 mg total) by mouth 2 (two) times daily with a meal.     cinacalcet 60 MG tablet  Commonly known as:  SENSIPAR  Take 60 mg by mouth daily.     diphenhydrAMINE 50 MG capsule  Commonly known as:  BENADRYL  Take 1 capsule (50 mg total) by mouth at bedtime.     FLUoxetine 20 MG capsule  Commonly known as:  PROZAC  Take 1 capsule (20 mg total) by mouth daily.     hydrOXYzine 25 MG tablet  Commonly known as:  ATARAX/VISTARIL  Take 1 tablet (25  mg total) by mouth 3 (three) times daily.     lamiVUDine 10 MG/ML solution  Commonly known as:  EPIVIR  Take 5 mLs (50 mg total) by mouth daily.     multivitamin Tabs tablet  Take 1 tablet by mouth at bedtime.     omeprazole 20 MG capsule  Commonly known as:  PRILOSEC  Take 20 mg by mouth daily.     raltegravir 400 MG tablet  Commonly known as:  ISENTRESS  Take 400 mg by mouth 2 (two) times daily.     sevelamer carbonate 800 MG tablet  Commonly known as:  RENVELA  Take 1,600-2,400 mg by mouth 5 (five) times daily. 3 Tabs with meals and 2 tabs with snacks     tenofovir 300 MG tablet  Commonly known as:  VIREAD  Take 300 mg by mouth every Friday. Takes on Fridays.       Allergies  Allergen Reactions  . Tramadol      "Gives me bad dreams"  . Morphine And Related Hives and Rash      The results of significant diagnostics from this hospitalization (including imaging, microbiology, ancillary and laboratory) are listed below for reference.    Significant Diagnostic Studies: Dg Chest 2 View  11/08/2013   CLINICAL DATA:  Recent pneumonia, dialysis patient, patient depressed  EXAM: CHEST  2 VIEW  COMPARISON:  10/21/2013  FINDINGS: Stable mild cardiomegaly and uncoiling of the aorta. Vascular pattern normal. Mild central bronchitic change is stable. Areas of discoid atelectasis have improved. No consolidation or effusion.  IMPRESSION: No evidence of pulmonary edema currently. Vascular congestion with mild bronchitic change. Bilateral infiltrates seen on the prior study have essentially resolved and may have been due to either pneumonia or atelectasis.   Electronically Signed   By: Esperanza Heir M.D.   On: 11/08/2013 20:07   Dg Chest 2 View  10/21/2013   CLINICAL DATA:  Cough. Shortness of breath. chest pain. Current history of HIV, end-stage renal disease, and hypertension.  EXAM: CHEST  2 VIEW  COMPARISON:  Portable examinations 10/06/2013, 05/25/2013. Two-view chest x-ray 09/29/2013, 07/31/2013.  FINDINGS: Cardiac silhouette mildly enlarged but stable. Mild diffuse interstitial pulmonary edema, best visualized on the lateral image. No confluent airspace consolidation. Linear atelectasis in the lower lobes and the inferior upper lobes bilaterally. No pleural effusions. Degenerative changes involving the thoracic spine.  IMPRESSION: Mild CHF, with mild cardiomegaly and mild diffuse interstitial pulmonary edema.   Electronically Signed   By: Hulan Saas M.D.   On: 10/21/2013 21:38   Dg Abd Portable 1v  11/12/2013   CLINICAL DATA:  Abdominal distention.  Constipation.  EXAM: PORTABLE ABDOMEN - 1 VIEW  COMPARISON:  DG ABDOMEN ACUTE 2V W/ 1V CHEST dated 02/11/2012; DG HIP COMPLETE 2+V*R* dated 10/12/2012  FINDINGS:  There is prominent formed stool throughout the colon. No dilated small bowel is observed.  There is prominent degenerative arthropathy of both hips. Possible flattening of the right femoral head.  IMPRESSION: 1.  Prominent stool throughout the colon favors constipation. 2. Indistinctness of the right hip joint. There is considerable degenerative arthropathy of both hips. Possible flattening of the right femoral head.   Electronically Signed   By: Herbie Baltimore M.D.   On: 11/12/2013 13:17    Microbiology: Recent Results (from the past 240 hour(s))  MRSA PCR SCREENING     Status: None   Collection Time    11/09/13  2:48 AM      Result Value Range  Status   MRSA by PCR NEGATIVE  NEGATIVE Final   Comment:            The GeneXpert MRSA Assay (FDA     approved for NASAL specimens     only), is one component of a     comprehensive MRSA colonization     surveillance program. It is not     intended to diagnose MRSA     infection nor to guide or     monitor treatment for     MRSA infections.     Labs: Basic Metabolic Panel:  Recent Labs Lab 11/08/13 1853 11/08/13 2004 11/09/13 0537 11/11/13 0830  NA 139  --  140 133*  K 7.3* 7.3* 3.7 5.6*  CL 90*  --  93* 88*  CO2 22  --  27 26  GLUCOSE 88  --  178* 97  BUN 70*  --  22 43*  CREATININE 13.13*  --  6.50* 11.13*  CALCIUM 9.0  --  9.3 7.9*  PHOS  --   --   --  5.0*   Liver Function Tests:  Recent Labs Lab 11/09/13 0537 11/11/13 0830  AST 12  --   ALT 6  --   ALKPHOS 301*  --   BILITOT 0.3  --   PROT 7.7  --   ALBUMIN 3.5 3.4*   No results found for this basename: LIPASE, AMYLASE,  in the last 168 hours No results found for this basename: AMMONIA,  in the last 168 hours CBC:  Recent Labs Lab 11/08/13 1853 11/09/13 0537 11/11/13 0830  WBC 9.3 6.4 7.3  NEUTROABS 5.0  --   --   HGB 9.7* 9.4* 8.6*  HCT 29.1* 28.2* 26.7*  MCV 106.2* 107.2* 107.7*  PLT 311 266 263   Cardiac Enzymes:  Recent Labs Lab  11/09/13 0537  TROPONINI <0.30   BNP: BNP (last 3 results)  Recent Labs  10/06/13 0334 10/21/13 2037 11/08/13 1903  PROBNP 31834.0* 23932.0* 42892.0*   CBG:  Recent Labs Lab 11/08/13 2045  GLUCAP 109*       Signed:  Iann Rodier  [facility] 11/12/2013, 3:23 PM

## 2013-11-12 NOTE — Progress Notes (Signed)
This nurse spoke with Amy Stuckey social worker about the need of shelter lists in [place] and transportation from shelter to dialysis in  [place].  Stated that she was in a meeting and would be up afterwards and speak with patient but that patient already has everything she needs.

## 2013-11-12 NOTE — ED Notes (Signed)
Social Worker in to see pt.

## 2013-11-12 NOTE — Progress Notes (Addendum)
[name] [id number] Admitted to [id number]: 11/12/2013 11:49 PM Attending Provider: Haydee Monicaachal A David, MD   History:  obtained from the patient.  Pt orientation to unit, room and routine. Information packet given to patient and safety video watched.  Admission INP armband ID verified with patient and in place. Side rails in place, fall risk assessment complete with patient verbalizing understanding of risks associated with falls. Pt verbalizes an understanding of how to use the call bell and to call for help before getting out of bed.   Pt refuses to have nursing staff look at belongings. Pt claims to have medications. Per pt, "That's my medicine. I don't want no one touching it."   Will cont to monitor and assist as needed.  [name], Jean Korber J, RN 11/12/2013 11:49 PM

## 2013-11-12 NOTE — ED Notes (Signed)
Pt from homeless shelter - states she was d/c'd from this facility today after being admitted for hyperkalemia, pt currently a dialysis pt - pt was d/c'd to homeless shelter and when pt arrived to shelter they stated they had no beds and had no idea she was coming - pt states she then began experiencing central chest pain/aching, mild shortness of breath and nausea that the pt associates w/ anxiety d/t having no where to go. Pt also refused all EMS treatment en route. Pt A&Ox4, skin warm and dry, in no acute distress.

## 2013-11-12 NOTE — Progress Notes (Signed)
Called to nurses desk screaming at unit secretary about someone stealing her money.  This nurse had security called . This nurse went into room and patient was yelling at this nurse " somebody took my [employer] money. 120.00 and I want it back. "  Informed that security was on the way.  When security arrived patient had her clothes off and had to put them back on before security would go back in .  Nurse TEch  Helped patient get dressed and in the process the $120.00 in question was found when patient was flinging her covers around the bed and it fell out of the linens.  Security and this nurse entered the room and asked her if she would allow security to take her valuables and lock them up and she refused, stating " I don't trust you m-----f-----s and I ain't locking up nothing". Advised by security that since she was refusing to lock up her valuables that she would be responsible for them.  Patient told this nurse, " if you people would stay outta my pocketbook I wouldn't lose my stuff. Let me catch you stealing from me and I will kill you!"  After being informed by this nurse that she was in a camera room and the room was being monitored, she stopped yelling and screaming and became quite.

## 2013-11-12 NOTE — Progress Notes (Signed)
Clinical Social Work Department BRIEF PSYCHOSOCIAL ASSESSMENT 11/12/2013  Patient:  [name],[name]     Account Number:  [id number]     Admit date:  11/12/2013  Clinical Social Worker:  Illene SilverRAKE,Evangelene Vora, LCSW  Date/Time:  11/12/2013 11:05 PM  Referred by:  CSW  Date Referred:  11/12/2013 Referred for  SNF Placement   Other Referral:   Interview type:  Patient Other interview type:   Database review.    PSYCHOSOCIAL DATA Living Status:  OTHER Admitted from facility:   Level of care:   Primary support name:  son Raynald Kempjimmy farris Primary support relationship to patient:  CHILD, ADULT Degree of support available:   Fair.  Pt cannot live with her son b/c of her prior drug addiction.    CURRENT CONCERNS Current Concerns  Post-Acute Placement   Other Concerns:    SOCIAL WORK ASSESSMENT / PLAN CSW spoke with pt, at length, in the ED re: dcp.  [facility] and was not able to sleep on floor mattress due to her limited mobility/physical health. Pt also did not have definite transportation arranged [facility] [place] on 2/5, per report.  Pt refused placement 12/4 but now is agreeable to placement.  CSW discussed other options for d/c with pt, but she admits to be limited due to social situations of most friends/relatives.  Pt has many relatives/friends involved in selling/doing drugs and she wants to get away from that environment and start s new life.  Pt has refused placement in the past because the facilities are smoke free.  CSW explained that if pt did not accept placement in the am, that she would have to find a relative/friend who could take her in.  Pt agreeable to be smoke free and d/c to NH.  Placement process started.   Assessment/plan status:  Psychosocial Support/Ongoing Assessment of Needs Other assessment/ plan:   Pt [place] SNFs, but understands that other counties will need to be explored.   Information/referral to  community resources:    PATIENT'S/FAMILY'S RESPONSE TO PLAN OF CARE: Pt agreeable to NH as she is aware that she has no other options and she needs 24 hour assistance at this time.  Pt anxious about her w/c and walker that are at [facility] and will need to be retrieved when she gets to her facility.  Emotional support offered.

## 2013-11-13 ENCOUNTER — Encounter (HOSPITAL_COMMUNITY): Payer: Self-pay

## 2013-11-13 LAB — GLUCOSE, CAPILLARY: GLUCOSE-CAPILLARY: 145 mg/dL — AB (ref 70–99)

## 2013-11-13 LAB — TROPONIN I

## 2013-11-13 MED ORDER — FLUOXETINE HCL 20 MG PO CAPS
20.0000 mg | ORAL_CAPSULE | Freq: Every day | ORAL | Status: DC
  Administered 2013-11-13 – 2013-11-18 (×6): 20 mg via ORAL
  Filled 2013-11-13 (×6): qty 1

## 2013-11-13 MED ORDER — HEPARIN SODIUM (PORCINE) 1000 UNIT/ML DIALYSIS
1000.0000 [IU] | INTRAMUSCULAR | Status: DC | PRN
  Filled 2013-11-13: qty 1

## 2013-11-13 MED ORDER — RALTEGRAVIR POTASSIUM 400 MG PO TABS
400.0000 mg | ORAL_TABLET | Freq: Two times a day (BID) | ORAL | Status: DC
  Administered 2013-11-13 – 2013-11-18 (×12): 400 mg via ORAL
  Filled 2013-11-13 (×13): qty 1

## 2013-11-13 MED ORDER — LAMIVUDINE 10 MG/ML PO SOLN
50.0000 mg | Freq: Every day | ORAL | Status: DC
  Administered 2013-11-13 – 2013-11-18 (×5): 50 mg via ORAL
  Filled 2013-11-13 (×6): qty 5

## 2013-11-13 MED ORDER — ACETAMINOPHEN 325 MG PO TABS
650.0000 mg | ORAL_TABLET | ORAL | Status: DC | PRN
Start: 2013-11-13 — End: 2013-11-18
  Administered 2013-11-13 – 2013-11-18 (×8): 650 mg via ORAL
  Filled 2013-11-13 (×10): qty 2

## 2013-11-13 MED ORDER — RENA-VITE PO TABS
1.0000 | ORAL_TABLET | Freq: Every day | ORAL | Status: DC
  Administered 2013-11-13 (×2): 1 via ORAL
  Administered 2013-11-14: 22:00:00 via ORAL
  Administered 2013-11-15: 1 via ORAL
  Administered 2013-11-15 – 2013-11-16 (×2): via ORAL
  Administered 2013-11-17: 1 via ORAL
  Filled 2013-11-13 (×8): qty 1

## 2013-11-13 MED ORDER — SODIUM CHLORIDE 0.9 % IJ SOLN: 3.0000 mL | INTRAMUSCULAR | Status: DC | PRN

## 2013-11-13 MED ORDER — PENTAFLUOROPROP-TETRAFLUOROETH EX AERO: 1.0000 "application " | INHALATION_SPRAY | CUTANEOUS | Status: DC | PRN

## 2013-11-13 MED ORDER — HEPARIN SODIUM (PORCINE) 1000 UNIT/ML DIALYSIS
100.0000 [IU]/kg | INTRAMUSCULAR | Status: DC | PRN
  Filled 2013-11-13: qty 12

## 2013-11-13 MED ORDER — SEVELAMER CARBONATE 800 MG PO TABS: 1600.0000 mg | ORAL_TABLET | Freq: Every day | ORAL | Status: DC

## 2013-11-13 MED ORDER — SODIUM CHLORIDE 0.9 % IV SOLN: 100.0000 mL | INTRAVENOUS | Status: DC | PRN

## 2013-11-13 MED ORDER — DIPHENHYDRAMINE HCL 50 MG PO CAPS
50.0000 mg | ORAL_CAPSULE | Freq: Every day | ORAL | Status: DC
  Filled 2013-11-13: qty 1

## 2013-11-13 MED ORDER — DIPHENHYDRAMINE HCL 25 MG PO CAPS
50.0000 mg | ORAL_CAPSULE | Freq: Every evening | ORAL | Status: DC | PRN
  Administered 2013-11-13 – 2013-11-18 (×4): 50 mg via ORAL
  Filled 2013-11-13 (×3): qty 2

## 2013-11-13 MED ORDER — NEPRO/CARBSTEADY PO LIQD: 237.0000 mL | ORAL | Status: DC | PRN

## 2013-11-13 MED ORDER — TENOFOVIR DISOPROXIL FUMARATE 300 MG PO TABS
300.0000 mg | ORAL_TABLET | ORAL | Status: DC
  Administered 2013-11-14: 300 mg via ORAL
  Filled 2013-11-13: qty 1

## 2013-11-13 MED ORDER — NICOTINE 21 MG/24HR TD PT24
21.0000 mg | MEDICATED_PATCH | Freq: Every day | TRANSDERMAL | Status: DC
  Administered 2013-11-13 – 2013-11-18 (×6): 21 mg via TRANSDERMAL
  Filled 2013-11-13 (×6): qty 1

## 2013-11-13 MED ORDER — LIDOCAINE-PRILOCAINE 2.5-2.5 % EX CREA: 1.0000 "application " | TOPICAL_CREAM | CUTANEOUS | Status: DC | PRN

## 2013-11-13 MED ORDER — HYDROXYZINE HCL 25 MG PO TABS
25.0000 mg | ORAL_TABLET | Freq: Three times a day (TID) | ORAL | Status: DC
  Administered 2013-11-13 – 2013-11-18 (×15): 25 mg via ORAL
  Filled 2013-11-13 (×18): qty 1

## 2013-11-13 MED ORDER — SEVELAMER CARBONATE 800 MG PO TABS
1600.0000 mg | ORAL_TABLET | Freq: Two times a day (BID) | ORAL | Status: DC | PRN
  Filled 2013-11-13: qty 2

## 2013-11-13 MED ORDER — CARVEDILOL 3.125 MG PO TABS
3.1250 mg | ORAL_TABLET | Freq: Two times a day (BID) | ORAL | Status: DC
  Administered 2013-11-13 – 2013-11-18 (×9): 3.125 mg via ORAL
  Filled 2013-11-13 (×13): qty 1

## 2013-11-13 MED ORDER — AMLODIPINE BESYLATE 5 MG PO TABS
5.0000 mg | ORAL_TABLET | Freq: Every day | ORAL | Status: DC
  Administered 2013-11-13 – 2013-11-17 (×6): 5 mg via ORAL
  Filled 2013-11-13 (×7): qty 1

## 2013-11-13 MED ORDER — OXYCODONE-ACETAMINOPHEN 5-325 MG PO TABS
1.0000 | ORAL_TABLET | Freq: Once | ORAL | Status: AC
  Administered 2013-11-13: 1 via ORAL
  Filled 2013-11-13 (×2): qty 1

## 2013-11-13 MED ORDER — SEVELAMER CARBONATE 800 MG PO TABS
2400.0000 mg | ORAL_TABLET | Freq: Three times a day (TID) | ORAL | Status: DC
  Administered 2013-11-13 – 2013-11-18 (×14): 2400 mg via ORAL
  Filled 2013-11-13 (×19): qty 3

## 2013-11-13 MED ORDER — LIDOCAINE HCL (PF) 1 % IJ SOLN: 5.0000 mL | INTRAMUSCULAR | Status: DC | PRN

## 2013-11-13 MED ORDER — SODIUM CHLORIDE 0.9 % IV SOLN: 250.0000 mL | INTRAVENOUS | Status: DC | PRN

## 2013-11-13 MED ORDER — SODIUM CHLORIDE 0.9 % IJ SOLN: 3.0000 mL | Freq: Two times a day (BID) | INTRAMUSCULAR | Status: DC

## 2013-11-13 MED ORDER — ASPIRIN EC 81 MG PO TBEC
81.0000 mg | DELAYED_RELEASE_TABLET | Freq: Every day | ORAL | Status: DC
  Administered 2013-11-13 – 2013-11-18 (×6): 81 mg via ORAL
  Filled 2013-11-13 (×6): qty 1

## 2013-11-13 MED ORDER — OXYCODONE-ACETAMINOPHEN 5-325 MG PO TABS: 1.0000 | ORAL_TABLET | Freq: Once | ORAL | Status: DC

## 2013-11-13 MED ORDER — NICOTINE 21 MG/24HR TD PT24: 21.0000 mg | MEDICATED_PATCH | Freq: Every day | TRANSDERMAL | Status: DC

## 2013-11-13 NOTE — Procedures (Signed)
I was present at this dialysis session. I have reviewed the session itself and made appropriate changes.   Sabra Heckyan Lynn Recendiz  MD 11/13/2013, 11:09 AM

## 2013-11-13 NOTE — Clinical Social Work Note (Addendum)
CSW and CSW intern met with [facility] admissions director Roderic Ovens about patient. Roderic Ovens made a bed offer for the patient. CSW intern introduced self to patient and discussed bed offers and discharge plans. Patient was agreeable to go to [facility] and is aware of the non smoking rules of the facility. Patient also met with admissions director about the facility and the regulations they have there.   CSW intern then contacted the [facility] for the patient about the patient's walker and wheelchair. Admissions director confirmed they had patient's belongings and would keep it safe. CSW intern advised admissions director that a representative from [facility] would be by to pick up patient's belongings.   CSW intern then called [employer] transportation to make sure that the patient's dialysis was set up for Saturday. CSW intern gave [place] new address and would confirm with [place] the patient's dialysis center and time.   CSW intern also contacted the patient's dialysis center, [place], in [place] to find out what time the patient dialyze. CSW intern spoke with clinical medical assistant, Amy about the patient. Amy advised CSW intern that the patient has been with them for 3 years but has no place to live and or transportation for dialysis. Amy requested if CSW intern could get a dialysis center in [facility] since the patient will be living in [place]. CSW intern and CSW met with the Nurse director, Jonelle Sidle about the patient's change in dialysis center. Jonelle Sidle contacted [place] and spoke with Amy to see if the patient was dismissed from the center. Jonelle Sidle is agreeable to changing dialysis center to [facility]. CSW intern will update about dialysis center change.   Patient has a 3 day qualifying stay. Patient was admitted on 11/08/13 and discharged on 11/12/13 for inpatient stay.

## 2013-11-13 NOTE — Progress Notes (Signed)
Called and spoke with the Administrator Amy at the [facility]. Asked Amy if patient was still an official patient of her center or if she had been dismissed. Amy explained that patient had not been formally dismissed, however she had reservations regarding accepting patient because of her prior history of not having a "home". Also verbalized concern that patients discharge disposition was planned [place] be in [place] and patient should have a center in [place].  Spoke with Reesa ChewSheila Richardson in [facility] and began the process of locating an HD appointment at a [facility] center. Per [name], [facility]. The clip office will contact the [facility] [place] [facility] and process the transfer. Currently awaiting specific HD schedule at [facility] and the approval from their Medical Director to accept patient at the center.  Julias Mould, [name]

## 2013-11-13 NOTE — Discharge Summary (Signed)
Physician Discharge Summary  Jean CooleyMargaret Ann [name] ZOX:[id number] DOB: 10-06-1951 DOA: 11/12/2013  PCP: No PCP Per Patient  Admit date: 11/12/2013 Discharge date: 11/14/2013  Time spent: 25 minutes  Recommendations for Outpatient Follow-up:  Patient is being discharged to skilled nursing facility Patient is being [facility] with next assess session on 2/7  Discharge Diagnoses:  Principal Problem:   Chest pain Active Problems:   HIV infection   Hyperkalemia   ESRD (end stage renal disease) on dialysis   Homelessness   Discharge Condition: Improved, being discharged to skilled nursing facility  Diet recommendation: Heart healthy  Filed Weights   11/12/13 2337 11/13/13 1100 11/13/13 1448  Weight: 113.1 kg (249 lb 5.4 oz) 115.1 kg (253 lb 12 oz) 111 kg (244 lb 11.4 oz)    History of present illness:  Patient is a 63 year old female past medical history of end-stage renal disease on dialysis plus HIV discharged on 2/4 to a homeless shelter. She had been offered skilled nursing facility placement and had refused. However when she arrived homeless shelter she started feeling very anxious and wanted to reconsider going to skilled nursing. She also is complaining of some chest pain. She was brought into the emergency room for further evaluation. She was noted to have potassium of 6.6.  Hospital Course:  Principal Problem:   Chest pain: Enzymes x3 negative. Felt to be more related to anxiety. When she was reassured and the emergency room, chest pain resolved Active Problems:   HIV infection: Continue on antiretrovirals.    Hyperkalemia: Secondary end-stage renal disease. Underwent dialysis today.    ESRD (end stage renal disease) on dialysis: Patient seen by nephrology and dialysis session done 2/5 without incident. Patient previously was [facility], however they have reservations because of patient's homeless  disposition. Because she's not going to skilled nursing facility, arrangements are being made for her to [facility]. Once this process has been completed, patient will be discharged.    Homelessness: Because patient had full qualifying stay for 3 days prior admission, able to go to skilled nursing facility. Patient accepted and plan will be for her to go on 2/6.  Procedures:  Status post dialysis done 2/5  Consultations: Nephrology  Discharge Exam: Filed Vitals:   11/13/13 1700  BP: 149/80  Pulse: 88  Temp: 98.4 F (36.9 C)  Resp: 16    General: Fatigue, otherwise alert and oriented x3 Cardiovascular: Regular rate and rhythm, S1-S2, 2/6 systolic ejection murmur Respiratory: Clear auscultation bilaterally  Discharge Instructions     Medication List         amLODipine 5 MG tablet  Commonly known as:  NORVASC  Take 1 tablet (5 mg total) by mouth at bedtime.     aspirin 81 MG EC tablet  Take 1 tablet (81 mg total) by mouth daily.     carvedilol 3.125 MG tablet  Commonly known as:  COREG  Take 1 tablet (3.125 mg total) by mouth 2 (two) times daily with a meal.     cinacalcet 60 MG tablet  Commonly known as:  SENSIPAR  Take 60 mg by mouth daily.     diphenhydrAMINE 50 MG capsule  Commonly known as:  BENADRYL  Take 1 capsule (50 mg total) by mouth at bedtime.     FLUoxetine 20 MG capsule  Commonly known as:  PROZAC  Take 1 capsule (20 mg total) by mouth daily.  hydrOXYzine 25 MG tablet  Commonly known as:  ATARAX/VISTARIL  Take 1 tablet (25 mg total) by mouth 3 (three) times daily.     lamiVUDine 10 MG/ML solution  Commonly known as:  EPIVIR  Take 5 mLs (50 mg total) by mouth daily.     multivitamin Tabs tablet  Take 1 tablet by mouth at bedtime.     nicotine 21 mg/24hr patch  Commonly known as:  NICODERM CQ - dosed in mg/24 hours  Place 1 patch (21 mg total) onto the skin daily.     omeprazole 20 MG capsule  Commonly  known as:  PRILOSEC  Take 20 mg by mouth daily.     raltegravir 400 MG tablet  Commonly known as:  ISENTRESS  Take 400 mg by mouth 2 (two) times daily.     sevelamer carbonate 800 MG tablet  Commonly known as:  RENVELA  Take 1,600-2,400 mg by mouth 5 (five) times daily. 3 Tabs with meals and 2 tabs with snacks     tenofovir 300 MG tablet  Commonly known as:  VIREAD  Take 300 mg by mouth every Friday. Takes on Fridays.       Allergies  Allergen Reactions  . Tramadol     "Gives me bad dreams"  . Morphine And Related Hives and Rash      The results of significant diagnostics from this hospitalization (including imaging, microbiology, ancillary and laboratory) are listed below for reference.    Significant Diagnostic Studies: Dg Chest 2 View  11/08/2013   CLINICAL DATA:  Recent pneumonia, dialysis patient, patient depressed  EXAM: CHEST  2 VIEW  COMPARISON:  10/21/2013  FINDINGS: Stable mild cardiomegaly and uncoiling of the aorta. Vascular pattern normal. Mild central bronchitic change is stable. Areas of discoid atelectasis have improved. No consolidation or effusion.  IMPRESSION: No evidence of pulmonary edema currently. Vascular congestion with mild bronchitic change. Bilateral infiltrates seen on the prior study have essentially resolved and may have been due to either pneumonia or atelectasis.   Electronically Signed   By: Esperanza Heir M.D.   On: 11/08/2013 20:07   Dg Chest 2 View  10/21/2013   CLINICAL DATA:  Cough. Shortness of breath. chest pain. Current history of HIV, end-stage renal disease, and hypertension.  EXAM: CHEST  2 VIEW  COMPARISON:  Portable examinations 10/06/2013, 05/25/2013. Two-view chest x-ray 09/29/2013, 07/31/2013.  FINDINGS: Cardiac silhouette mildly enlarged but stable. Mild diffuse interstitial pulmonary edema, best visualized on the lateral image. No confluent airspace consolidation. Linear atelectasis in the lower lobes and the inferior upper lobes  bilaterally. No pleural effusions. Degenerative changes involving the thoracic spine.  IMPRESSION: Mild CHF, with mild cardiomegaly and mild diffuse interstitial pulmonary edema.   Electronically Signed   By: Hulan Saas M.D.   On: 10/21/2013 21:38   Dg Abd Portable 1v  11/12/2013   CLINICAL DATA:  Abdominal distention.  Constipation.  EXAM: PORTABLE ABDOMEN - 1 VIEW  COMPARISON:  DG ABDOMEN ACUTE 2V W/ 1V CHEST dated 02/11/2012; DG HIP COMPLETE 2+V*R* dated 10/12/2012  FINDINGS: There is prominent formed stool throughout the colon. No dilated small bowel is observed.  There is prominent degenerative arthropathy of both hips. Possible flattening of the right femoral head.  IMPRESSION: 1.  Prominent stool throughout the colon favors constipation. 2. Indistinctness of the right hip joint. There is considerable degenerative arthropathy of both hips. Possible flattening of the right femoral head.   Electronically Signed   By: Herbie Baltimore  M.D.   On: 11/12/2013 13:17    Microbiology: Recent Results (from the past 240 hour(s))  MRSA PCR SCREENING     Status: None   Collection Time    11/09/13  2:48 AM      Result Value Range Status   MRSA by PCR NEGATIVE  NEGATIVE Final   Comment:            The GeneXpert MRSA Assay (FDA     approved for NASAL specimens     only), is one component of a     comprehensive MRSA colonization     surveillance program. It is not     intended to diagnose MRSA     infection nor to guide or     monitor treatment for     MRSA infections.     Labs: Basic Metabolic Panel:  Recent Labs Lab 11/08/13 1853 11/08/13 2004 11/09/13 0537 11/11/13 0830 11/12/13 1934  NA 139  --  140 133* 134*  K 7.3* 7.3* 3.7 5.6* 6.6*  CL 90*  --  93* 88* 92*  CO2 22  --  27 26 23   GLUCOSE 88  --  178* 97 82  BUN 70*  --  22 43* 31*  CREATININE 13.13*  --  6.50* 11.13* 7.95*  CALCIUM 9.0  --  9.3 7.9* 8.3*  PHOS  --   --   --  5.0*  --    Liver Function Tests:  Recent  Labs Lab 11/09/13 0537 11/11/13 0830  AST 12  --   ALT 6  --   ALKPHOS 301*  --   BILITOT 0.3  --   PROT 7.7  --   ALBUMIN 3.5 3.4*   No results found for this basename: LIPASE, AMYLASE,  in the last 168 hours No results found for this basename: AMMONIA,  in the last 168 hours CBC:  Recent Labs Lab 11/08/13 1853 11/09/13 0537 11/11/13 0830 11/12/13 1934  WBC 9.3 6.4 7.3 7.6  NEUTROABS 5.0  --   --   --   HGB 9.7* 9.4* 8.6* 10.0*  HCT 29.1* 28.2* 26.7* 30.7*  MCV 106.2* 107.2* 107.7* 108.5*  PLT 311 266 263 289   Cardiac Enzymes:  Recent Labs Lab 11/09/13 0537 11/13/13 1208  TROPONINI <0.30 <0.30   BNP: BNP (last 3 results)  Recent Labs  10/06/13 0334 10/21/13 2037 11/08/13 1903  PROBNP 31834.0* 23932.0* 42892.0*   CBG:  Recent Labs Lab 11/08/13 2045  GLUCAP 109*       Signed:  Virginia Rochester K  [facility] 11/13/2013, 6:26 PM

## 2013-11-14 MED ORDER — ONDANSETRON HCL 4 MG/2ML IJ SOLN: 4.0000 mg | Freq: Four times a day (QID) | INTRAMUSCULAR | Status: DC | PRN

## 2013-11-14 MED ORDER — ONDANSETRON HCL 4 MG PO TABS
4.0000 mg | ORAL_TABLET | Freq: Four times a day (QID) | ORAL | Status: DC | PRN
  Administered 2013-11-14 – 2013-11-15 (×2): 4 mg via ORAL
  Filled 2013-11-14 (×2): qty 1

## 2013-11-14 NOTE — Progress Notes (Addendum)
Patient is waiting to be "clipped".  No change overnight- will d/c to SNF once this process completed.  Discharge orders and summary done 2/5  Jean CanaryJessica Paidyn Mcferran DO

## 2013-11-14 NOTE — Clinical Social Work Note (Signed)
CSW reviewed note and approved. Below is an addendum to CSW Intern's note: CSW Tax inspectorntern and CSW talked with unit director Delaney Meigsamara about changing patient's dialysis center and call was made by Delaney Meigsamara to the dialysis center. They have not dismissed patient, however they reported difficulties with patient during the time she has been a patient there. Contact made by Delaney Meigsamara with dialysis center staff person [name] and process was initiated to change her center [place].  Genelle BalVanessa Serrita Lueth, MSW, LCSW [phone]

## 2013-11-14 NOTE — Clinical Social Work Note (Signed)
Patient medically stable to [facility]. Waiting for patient to be set-up at a dialysis facility in [place]. CLIP process not complete as of yet.  Genelle BalVanessa Reynold Mantell, MSW, LCSW [phone]

## 2013-11-15 MED ORDER — SODIUM CHLORIDE 0.9 % IV SOLN: 100.0000 mL | INTRAVENOUS | Status: DC | PRN

## 2013-11-15 MED ORDER — LIDOCAINE-PRILOCAINE 2.5-2.5 % EX CREA
1.0000 "application " | TOPICAL_CREAM | CUTANEOUS | Status: DC | PRN
  Filled 2013-11-15: qty 5

## 2013-11-15 MED ORDER — ALTEPLASE 2 MG IJ SOLR
2.0000 mg | Freq: Once | INTRAMUSCULAR | Status: DC | PRN
  Filled 2013-11-15: qty 2

## 2013-11-15 MED ORDER — HEPARIN SODIUM (PORCINE) 1000 UNIT/ML DIALYSIS
1000.0000 [IU] | INTRAMUSCULAR | Status: DC | PRN
Start: 2013-11-15 — End: 2013-11-15

## 2013-11-15 MED ORDER — NEPRO/CARBSTEADY PO LIQD
237.0000 mL | ORAL | Status: DC | PRN
  Filled 2013-11-15: qty 237

## 2013-11-15 MED ORDER — HEPARIN SODIUM (PORCINE) 1000 UNIT/ML DIALYSIS
100.0000 [IU]/kg | INTRAMUSCULAR | Status: DC | PRN
  Filled 2013-11-15: qty 12

## 2013-11-15 MED ORDER — OXYCODONE-ACETAMINOPHEN 5-325 MG PO TABS
1.0000 | ORAL_TABLET | Freq: Once | ORAL | Status: AC
Start: 2013-11-15 — End: 2013-11-15
  Administered 2013-11-15: 1 via ORAL
  Filled 2013-11-15: qty 1

## 2013-11-15 MED ORDER — HEPARIN SODIUM (PORCINE) 1000 UNIT/ML DIALYSIS: 20.0000 [IU]/kg | INTRAMUSCULAR | Status: DC | PRN

## 2013-11-15 MED ORDER — ALTEPLASE 2 MG IJ SOLR: 2.0000 mg | Freq: Once | INTRAMUSCULAR | Status: DC | PRN

## 2013-11-15 MED ORDER — PENTAFLUOROPROP-TETRAFLUOROETH EX AERO: 1.0000 "application " | INHALATION_SPRAY | CUTANEOUS | Status: DC | PRN

## 2013-11-15 MED ORDER — LIDOCAINE HCL (PF) 1 % IJ SOLN: 5.0000 mL | INTRAMUSCULAR | Status: DC | PRN

## 2013-11-15 NOTE — Progress Notes (Signed)
[facility] progress [facility]. Chief complaint. Chest pain. History of present illness. This 63 year old female in hospital with complaints of chest pain. Cardiac enzymes have been negative x3. Patient described as 10/10 chest pain tonight under the left breast. A 12-lead EKG was obtained urgently and this indicates normal sinus rhythm without indication of acute ischemia. Came to the bedside see the patient and by the time I arrived her chest pain had resolved. Was able to duplicate in part to pain with palpation under the left breast. Patient states that the pain was sharp and did not radiate. There is no diaphoresis or nausea associated. Physical exam Vital signs. Temperature 98.6, pulse 92, respiration 20, blood pressure 130/83. O2 sats 94%. General appearance. Obese elderly female who is alert, cooperative, and in no distress. Cardiac. Rate and rhythm regular. Lungs. Breath sounds are clear but reduced in the bases. Abdomen. Soft positive bowel sounds. No pain with palpation. Musculoskeletal. I was able to duplicate in part the patient's chest pain by palpating under the left breast. Impression/plan. Problem #1. Chest pain. Given patient's description and exam findings I doubt this is cardiac in origin. Her EKG does not look suspicious for acute ischemia. Nonetheless I will order troponin now and then every 6 hours for a total of 3 sets. We'll repeat an EKG in 6 hours to evaluate for any changes.

## 2013-11-15 NOTE — Procedures (Signed)
I was present at this dialysis session. I have reviewed the session itself and made appropriate changes.   Sabra Heckyan [name]  MD 11/15/2013, 8:47 AM

## 2013-11-15 NOTE — Progress Notes (Signed)
Patient complain of sudden sharp CP 10 out of 10 under L breast. SR on monitor, BP 134/83 HR 96 93%ra.  MD notified.  Will continue to monitor.

## 2013-11-15 NOTE — Progress Notes (Signed)
[facility] PROGRESS NOTE  Zettie CooleyMargaret Ann Furnish ZOX:[id number] DOB: 04-23-1951 DOA: 11/12/2013 PCP: No PCP Per Patient  Assessment/Plan: Chest pain: Enzymes x3 negative. Felt to be more related to anxiety. When she was reassured and the emergency room, chest pain resolved   HIV infection: Continue on antiretrovirals.   Hyperkalemia: Secondary end-stage renal disease.  ESRD (end stage renal disease) on dialysis: Patient seen by nephrology and dialysis session done 2/5 without incident. [facility], however they have reservations because of patient's homeless disposition. Because she's not going to skilled nursing facility, arrangements are being made for her to [facility]. Once this process has been completed, patient will be discharged.   Homelessness: Because patient had full qualifying stay for 3 days prior admission, able to go to skilled nursing facility. Patient accepted and plan will be for her to go   AWAITING "clipping" procedure  Code Status: full Family Communication: patient Disposition Plan:    Consultants:  renal  Procedures:    Antibiotics:    HPI/Subjective: Awaiting "clipping" Had dialysis today  Objective: Filed Vitals:   11/15/13 1245  BP: 106/68  Pulse: 103  Temp:   Resp:     Intake/Output Summary (Last 24 hours) at 11/15/13 1256 Last data filed at 11/15/13 0700  Gross per 24 hour  Intake    480 ml  Output      0 ml  Net    480 ml   Filed Weights   11/13/13 1448 11/13/13 2054 11/15/13 0841  Weight: 111 kg (244 lb 11.4 oz) 111.1 kg (244 lb 14.9 oz) 112.2 kg (247 lb 5.7 oz)    Exam:   General:  Awake, NAD  Data Reviewed: Basic Metabolic Panel:  Recent Labs Lab 11/08/13 1853 11/08/13 2004 11/09/13 0537 11/11/13 0830 11/12/13 1934  NA 139  --  140 133* 134*  K 7.3* 7.3* 3.7 5.6* 6.6*  CL 90*  --  93* 88* 92*  CO2 22  --  27 26 23   GLUCOSE  88  --  178* 97 82  BUN 70*  --  22 43* 31*  CREATININE 13.13*  --  6.50* 11.13* 7.95*  CALCIUM 9.0  --  9.3 7.9* 8.3*  PHOS  --   --   --  5.0*  --    Liver Function Tests:  Recent Labs Lab 11/09/13 0537 11/11/13 0830  AST 12  --   ALT 6  --   ALKPHOS 301*  --   BILITOT 0.3  --   PROT 7.7  --   ALBUMIN 3.5 3.4*   No results found for this basename: LIPASE, AMYLASE,  in the last 168 hours No results found for this basename: AMMONIA,  in the last 168 hours CBC:  Recent Labs Lab 11/08/13 1853 11/09/13 0537 11/11/13 0830 11/12/13 1934  WBC 9.3 6.4 7.3 7.6  NEUTROABS 5.0  --   --   --   HGB 9.7* 9.4* 8.6* 10.0*  HCT 29.1* 28.2* 26.7* 30.7*  MCV 106.2* 107.2* 107.7* 108.5*  PLT 311 266 263 289   Cardiac Enzymes:  Recent Labs Lab 11/09/13 0537 11/13/13 1208  TROPONINI <0.30 <0.30   BNP (last 3 results)  Recent Labs  10/06/13 0334 10/21/13 2037 11/08/13 1903  PROBNP 31834.0* 23932.0* 42892.0*   CBG:  Recent Labs Lab 11/08/13 2045 11/13/13 2057  GLUCAP 109* 145*    Recent Results (from the past 240 hour(s))  MRSA PCR  SCREENING     Status: None   Collection Time    11/09/13  2:48 AM      Result Value Range Status   MRSA by PCR NEGATIVE  NEGATIVE Final   Comment:            The GeneXpert MRSA Assay (FDA     approved for NASAL specimens     only), is one component of a     comprehensive MRSA colonization     surveillance program. It is not     intended to diagnose MRSA     infection nor to guide or     monitor treatment for     MRSA infections.     Studies: No results found.  Scheduled Meds: . amLODipine  5 mg Oral QHS  . aspirin EC  81 mg Oral Daily  . carvedilol  3.125 mg Oral BID WC  . FLUoxetine  20 mg Oral Daily  . hydrOXYzine  25 mg Oral TID  . lamiVUDine  50 mg Oral Daily  . multivitamin  1 tablet Oral QHS  . nicotine  21 mg Transdermal Daily  . raltegravir  400 mg Oral BID  . sevelamer carbonate  2,400 mg Oral TID WC  . sodium  chloride  3 mL Intravenous Q12H  . sodium chloride  3 mL Intravenous Q12H  . tenofovir  300 mg Oral Q Fri-1800   Continuous Infusions:   Principal Problem:   Chest pain Active Problems:   HIV infection   Hyperkalemia   ESRD (end stage renal disease) on dialysis   Homelessness    Time spent: 15    [facility], Siddiq Kaluzny  [facility] Pager [phone]. If 7PM-7AM, please contact night-coverage at [email or web address], password [facility] 11/15/2013, 12:56 PM  LOS: 3 days

## 2013-11-15 NOTE — Progress Notes (Signed)
Admit: 11/12/2013 LOS: 3  11F [facility] with hyperkalemia, chest pain  Subjective:  Back after not being able to stay at shelter To go to local SNF, [facility]  02/06 0701 - 02/07 0700 In: 600 [P.O.:600] Out: -   Filed Weights   11/13/13 1448 11/13/13 2054 11/15/13 0841  Weight: 111 kg (244 lb 11.4 oz) 111.1 kg (244 lb 14.9 oz) 112.2 kg (247 lb 5.7 oz)    Current meds: reviewed  Current Labs: reviewed    Physical Exam:  Blood pressure 133/72, pulse 86, temperature 98.5 F (36.9 C), temperature source Oral, resp. rate 15, height 5\' 4"  (1.626 m), weight 112.2 kg (247 lb 5.7 oz), SpO2 94.00%.  Assessment/Plan 1. ESRD: keep on TTS schedule.  Awaiting CLIP 2. Hyperkalemia: resolved 3. 2HPTH: cont sensiapar, Renvela 4. HIV: on ARVs 5. Anemia: Stable  Sabra Heckyan Kynisha Memon MD 11/15/2013, 8:48 AM   Recent Labs Lab 11/09/13 0537 11/11/13 0830 11/12/13 1934  NA 140 133* 134*  K 3.7 5.6* 6.6*  CL 93* 88* 92*  CO2 27 26 23   GLUCOSE 178* 97 82  BUN 22 43* 31*  CREATININE 6.50* 11.13* 7.95*  CALCIUM 9.3 7.9* 8.3*  PHOS  --  5.0*  --     Recent Labs Lab 11/08/13 1853 11/09/13 0537 11/11/13 0830 11/12/13 1934  WBC 9.3 6.4 7.3 7.6  NEUTROABS 5.0  --   --   --   HGB 9.7* 9.4* 8.6* 10.0*  HCT 29.1* 28.2* 26.7* 30.7*  MCV 106.2* 107.2* 107.7* 108.5*  PLT 311 266 263 289

## 2013-11-16 LAB — TROPONIN I

## 2013-11-16 MED ORDER — LORAZEPAM 0.5 MG PO TABS
0.5000 mg | ORAL_TABLET | Freq: Four times a day (QID) | ORAL | Status: DC | PRN
  Administered 2013-11-16 – 2013-11-17 (×2): 0.5 mg via ORAL
  Filled 2013-11-16 (×2): qty 1

## 2013-11-16 NOTE — Progress Notes (Signed)
Admit: 11/12/2013 LOS: 4  58F [facility] with [facility]kal[facility], chest pain.  Discharged to shelter but returned to ED sameday.  Now awaiting placement in local SNF and clipping to local HD unit.   Subjective:  No c/o other than R hip pain only relieved by percocet  02/07 0701 - 02/08 0700 In: 720 [P.O.:720] Out: 3200   Filed Weights   11/13/13 2054 11/15/13 0841 11/15/13 1255  Weight: 111.1 kg (244 lb 14.9 oz) 112.2 kg (247 lb 5.7 oz) 109 kg (240 lb 4.8 oz)    Current meds: reviewed  Current Labs: reviewed    Physical Exam:  Blood pressure 151/80, pulse 79, temperature 99.3 F (37.4 C), temperature source Oral, resp. rate 18, height 5\' 4"  (1.626 m), weight 109 kg (240 lb 4.8 oz), SpO2 95.00%.  Assessment/Plan 1. ESRD: keep on TTS schedule.  Awaiting CLIP 2. Hyperkalemia: resolved 3. 2HPTH: cont sensipar, Renvela 4. HIV: on ARVs 5. Anemia: Stable  Sabra Heckyan Abbeygail Igoe MD 11/16/2013, 12:56 PM   Recent Labs Lab 11/11/13 0830 11/12/13 1934  NA 133* 134*  K 5.6* 6.6*  CL 88* 92*  CO2 26 23  GLUCOSE 97 82  BUN 43* 31*  CREATININE 11.13* 7.95*  CALCIUM 7.9* 8.3*  PHOS 5.0*  --     Recent Labs Lab 11/11/13 0830 11/12/13 1934  WBC 7.3 7.6  HGB 8.6* 10.0*  HCT 26.7* 30.7*  MCV 107.7* 108.5*  PLT 263 289

## 2013-11-16 NOTE — Progress Notes (Signed)
Patient verbally abusive to staff, refused labs and Epivir. Continues to request over limit of food and liquids,cursing staff out when/if not given.

## 2013-11-16 NOTE — Progress Notes (Signed)
Chart reviewed.  Per RN, requesting continues fluids, sodas, snacks, abusive to staff, refusing labs and meds.  [facility] PROGRESS NOTE  [name] ZOX:[id number] DOB: 01/30/1951 DOA: 11/12/2013 PCP: No PCP Per Patient  Assessment/Plan: Chest pain: Enzymes x3 negative. Had another episode last night. Refused further troponins. None now. D/c tele. No further workup due to noncompliance  HIV infection: refused epivir because no one would give her additional snacks and liquid  Hyperkalemia: Secondary end-stage renal disease. Refuses further labs. Will not liberalize diet  ESRD (end stage renal disease) on dialysis: Patient seen by nephrology and dialysis session done 2/5 without incident. Patient previously was going to [facility] hemodialysis center, however they have reservations because of patient's homeless disposition. Because she's not going to skilled nursing facility, arrangements are being made for her to go to [facility]. Once this process has been completed, patient will be discharged.   Homelessness: Because patient had full qualifying stay for 3 days prior admission, able to go to skilled nursing facility. Patient accepted and plan will be for her to go   AWAITING "clipping" procedure  Code Status: full Family Communication: patient Disposition Plan: SNF   Consultants:  renal  Procedures:    Antibiotics:    HPI/Subjective: Upset about not enough liquids and snacks. Refusing some meds because she does not like current [employer] fund[employer].   Objective: Filed Vitals:   11/16/13 1156  BP: 151/80  Pulse: 79  Temp: 99.3 F (37.4 C)  Resp: 18    Intake/Output Summary (Last 24 hours) at 11/16/13 1544 Last data filed at 11/16/13 1400  Gross per 24 hour  Intake   1220 ml  Output      0 ml  Net   1220 ml   Filed Weights   11/13/13 2054 11/15/13 0841 11/15/13 1255  Weight: 111.1 kg (244 lb 14.9 oz) 112.2 kg (247 lb 5.7 oz) 109 kg (240 lb  4.8 oz)    Exam:   General:  Awake, agitated. Yelling.  Lungs CTA without WRR  CV RRR without MGR  abd obese  Ext no cce  Data Reviewed: Basic Metabolic Panel:  Recent Labs Lab 11/11/13 0830 11/12/13 1934  NA 133* 134*  K 5.6* 6.6*  CL 88* 92*  CO2 26 23  GLUCOSE 97 82  BUN 43* 31*  CREATININE 11.13* 7.95*  CALCIUM 7.9* 8.3*  PHOS 5.0*  --    Liver Function Tests:  Recent Labs Lab 11/11/13 0830  ALBUMIN 3.4*   No results found for this basename: LIPASE, AMYLASE,  in the last 168 hours No results found for this basename: AMMONIA,  in the last 168 hours CBC:  Recent Labs Lab 11/11/13 0830 11/12/13 1934  WBC 7.3 7.6  HGB 8.6* 10.0*  HCT 26.7* 30.7*  MCV 107.7* 108.5*  PLT 263 289   Cardiac Enzymes:  Recent Labs Lab 11/13/13 1208 11/16/13 0105  TROPONINI <0.30 <0.30   BNP (last 3 results)  Recent Labs  10/06/13 0334 10/21/13 2037 11/08/13 1903  PROBNP 31834.0* 23932.0* 42892.0*   CBG:  Recent Labs Lab 11/13/13 2057  GLUCAP 145*    Recent Results (from the past 240 hour(s))  MRSA PCR SCREENING     Status: None   Collection Time    11/09/13  2:48 AM      Result Value Range Status   MRSA by PCR NEGATIVE  NEGATIVE Final   Comment:            The  GeneXpert MRSA Assay (FDA     approved for NASAL specimens     only), is one component of a     comprehensive MRSA colonization     surveillance program. It is not     intended to diagnose MRSA     infection nor to guide or     monitor treatment for     MRSA infections.     Studies: No results found.  Scheduled Meds: . amLODipine  5 mg Oral QHS  . aspirin EC  81 mg Oral Daily  . carvedilol  3.125 mg Oral BID WC  . FLUoxetine  20 mg Oral Daily  . hydrOXYzine  25 mg Oral TID  . lamiVUDine  50 mg Oral Daily  . multivitamin  1 tablet Oral QHS  . nicotine  21 mg Transdermal Daily  . raltegravir  400 mg Oral BID  . sevelamer carbonate  2,400 mg Oral TID WC  . sodium chloride  3  mL Intravenous Q12H  . sodium chloride  3 mL Intravenous Q12H  . tenofovir  300 mg Oral Q Fri-1800   Continuous Infusions:   Time spent: 25  Min Collymore L  [facility] Pager [phone]. If 7PM-7AM, please contact night-coverage at [email or web address], password [email or web address] 11/16/2013, 3:44 PM  LOS: 4 days

## 2013-11-17 DIAGNOSIS — F17200 Nicotine dependence, unspecified, uncomplicated: Secondary | ICD-10-CM

## 2013-11-17 MED ORDER — BISACODYL 10 MG RE SUPP
10.0000 mg | Freq: Once | RECTAL | Status: DC
  Filled 2013-11-17: qty 1

## 2013-11-17 MED ORDER — LORAZEPAM 0.5 MG PO TABS: 0.5000 mg | ORAL_TABLET | Freq: Four times a day (QID) | ORAL | Status: DC | PRN

## 2013-11-17 NOTE — Progress Notes (Signed)
11/17/2013 12:06 PM Hemodialysis Outpatient Note; this patient has been accepted at [facility] on a Tuesday, Thursday and Saturday 2nd shift schedule. The center will be able to begin treatment tomorrow Tuesday February 10,2015 at 1115 AM. Please have a family member accompany patient if patient is unable to sign her own paperwork and consents. Thank you. Tilman NeatKrzywonos, Jovannie Ulibarri H

## 2013-11-17 NOTE — Progress Notes (Signed)
Pt refusing bed alarm. Sitting on side of bed, will continue to monitor.

## 2013-11-17 NOTE — Progress Notes (Signed)
Admit: 11/12/2013 LOS: 5  12[facility] with [facility]kal[facility], chest pain.  Discharged to shelter but returned to ED sameday.  Now awaiting placement in local SNF and clipping to local HD unit.   Subjective:  No c/o other than leg cramps- constipation- would like a suppository  02/08 0701 - 02/09 0700 In: 1460 [P.O.:1460] Out: 0   Filed Weights   11/15/13 0841 11/15/13 1255 11/16/13 1950  Weight: 112.2 kg (247 lb 5.7 oz) 109 kg (240 lb 4.8 oz) 111.2 kg (245 lb 2.4 oz)    Current meds: reviewed  Current Labs: reviewed    Physical Exam:  Blood pressure 140/74, pulse 88, temperature 98.4 F (36.9 C), temperature source Oral, resp. rate 19, height 5\' 4"  (1.626 m), weight 111.2 kg (245 lb 2.4 oz), SpO2 96.00%.  Assessment/Plan 1. ESRD: keep on TTS schedule via AVF- next due tomorrow.  Awaiting CLIP 2. Hyperkalemia: resolved with HD 3. 2HPTH: cont sensipar, Renvela- will check PTH with HD tomorrow- none in system 4. HIV: on ARVs when doesn't refuse 5. Anemia: 2 values very different- will check hgb tomorrow and add ESA according to what it shows  Marven Veley A  11/17/2013, 11:35 AM   Recent Labs Lab 11/11/13 0830 11/12/13 1934  NA 133* 134*  K 5.6* 6.6*  CL 88* 92*  CO2 26 23  GLUCOSE 97 82  BUN 43* 31*  CREATININE 11.13* 7.95*  CALCIUM 7.9* 8.3*  PHOS 5.0*  --     Recent Labs Lab 11/11/13 0830 11/12/13 1934  WBC 7.3 7.6  HGB 8.6* 10.0*  HCT 26.7* 30.7*  MCV 107.7* 108.5*  PLT 263 289

## 2013-11-17 NOTE — Discharge Summary (Addendum)
Physician Discharge Summary  [name] ZOX:[id number] DOB: 31-Jan-1951 DOA: 11/12/2013  PCP: No PCP Per Patient  Admit date: 11/12/2013 Discharge date: 11/18/2013  Time spent: 35 minutes  Recommendations for Outpatient Follow-up:  Patient is being discharged to skilled nursing facility To dialysis TTS Monitor hemoglobin  Discharge Diagnoses:  Principal Problem:   Chest pain, noncardiac Active Problems:   HIV infection   Hyperkalemia   ESRD (end stage renal disease) on dialysis anemia  Discharge Condition: Improved, being discharged to skilled nursing facility  Diet recommendation: renal  Filed Weights   11/15/13 0841 11/15/13 1255 11/16/13 1950  Weight: 112.2 kg (247 lb 5.7 oz) 109 kg (240 lb 4.8 oz) 111.2 kg (245 lb 2.4 oz)    History of present illness:  Patient is a 63 year old female past medical history of end-stage renal disease on dialysis plus HIV discharged on 2/4 to a homeless shelter. She had been offered skilled nursing facility placement and had refused. However when she arrived homeless shelter she started feeling very anxious and wanted to reconsider going to skilled nursing. She also is complaining of some chest pain. She was brought into the emergency room for further evaluation. She was noted to have potassium of 6.6.  Hospital Course:  Principal Problem:   Chest pain: Enzymes x3 negative. Felt to be more related to anxiety. When she was reassured and the emergency room, chest pain resolved Active Problems:   HIV infection: Continue on antiretrovirals.    Hyperkalemia: Secondary end-stage renal disease. Had dialysis    ESRD (end stage renal disease) on dialysis: dialysis arranged for TTS  Chronic anemia: no bleeding.     Homelessness: qualifies for SNF  Procedures:  dialysis  Consultations: Nephrology  Discharge Exam: Filed Vitals:   11/17/13 1000  BP: 140/74  Pulse: 88  Temp: 98.4 F (36.9 C)  Resp: 19    General: asleep.  Arousable. Comfortable and cooperative Cardiovascular: Regular rate and rhythm, S1-S2, 2/6 systolic ejection murmur Respiratory: Clear auscultation bilaterally Ext no CCE  Discharge Instructions      Discharge Orders   Future Orders Complete By Expires   Walk with assistance  As directed        Medication List         amLODipine 5 MG tablet  Commonly known as:  NORVASC  Take 1 tablet (5 mg total) by mouth at bedtime.     aspirin 81 MG EC tablet  Take 1 tablet (81 mg total) by mouth daily.     carvedilol 3.125 MG tablet  Commonly known as:  COREG  Take 1 tablet (3.125 mg total) by mouth 2 (two) times daily with a meal.     cinacalcet 60 MG tablet  Commonly known as:  SENSIPAR  Take 60 mg by mouth daily.     diphenhydrAMINE 50 MG capsule  Commonly known as:  BENADRYL  Take 1 capsule (50 mg total) by mouth at bedtime.     FLUoxetine 20 MG capsule  Commonly known as:  PROZAC  Take 1 capsule (20 mg total) by mouth daily.     hydrOXYzine 25 MG tablet  Commonly known as:  ATARAX/VISTARIL  Take 1 tablet (25 mg total) by mouth 3 (three) times daily.     lamiVUDine 10 MG/ML solution  Commonly known as:  EPIVIR  Take 5 mLs (50 mg total) by mouth daily.     LORazepam 0.5 MG tablet  Commonly known as:  ATIVAN  Take 1 tablet (0.5 mg total)  by mouth every 6 (six) hours as needed (agitation).     multivitamin Tabs tablet  Take 1 tablet by mouth at bedtime.     nicotine 21 mg/24hr patch  Commonly known as:  NICODERM CQ - dosed in mg/24 hours  Place 1 patch (21 mg total) onto the skin daily.     omeprazole 20 MG capsule  Commonly known as:  PRILOSEC  Take 20 mg by mouth daily.     raltegravir 400 MG tablet  Commonly known as:  ISENTRESS  Take 400 mg by mouth 2 (two) times daily.     sevelamer carbonate 800 MG tablet  Commonly known as:  RENVELA  Take 1,600-2,400 mg by mouth 5 (five) times daily. 3 Tabs with meals and 2 tabs with snacks     tenofovir 300 MG tablet   Commonly known as:  VIREAD  Take 300 mg by mouth every Friday. Takes on Fridays.       Allergies  Allergen Reactions  . Tramadol     "Gives me bad dreams"  . Morphine And Related Hives and Rash   Follow-up Information   Follow up with SNF provider In 1 week.       The results of significant diagnostics from this hospitalization (including imaging, microbiology, ancillary and laboratory) are listed below for reference.    Significant Diagnostic Studies: Dg Chest 2 View  11/08/2013   CLINICAL DATA:  Recent pneumonia, dialysis patient, patient depressed  EXAM: CHEST  2 VIEW  COMPARISON:  10/21/2013  FINDINGS: Stable mild cardiomegaly and uncoiling of the aorta. Vascular pattern normal. Mild central bronchitic change is stable. Areas of discoid atelectasis have improved. No consolidation or effusion.  IMPRESSION: No evidence of pulmonary edema currently. Vascular congestion with mild bronchitic change. Bilateral infiltrates seen on the prior study have essentially resolved and may have been due to either pneumonia or atelectasis.   Electronically Signed   By: Esperanza Heir M.D.   On: 11/08/2013 20:07   Dg Chest 2 View  10/21/2013   CLINICAL DATA:  Cough. Shortness of breath. chest pain. Current history of HIV, end-stage renal disease, and hypertension.  EXAM: CHEST  2 VIEW  COMPARISON:  Portable examinations 10/06/2013, 05/25/2013. Two-view chest x-ray 09/29/2013, 07/31/2013.  FINDINGS: Cardiac silhouette mildly enlarged but stable. Mild diffuse interstitial pulmonary edema, best visualized on the lateral image. No confluent airspace consolidation. Linear atelectasis in the lower lobes and the inferior upper lobes bilaterally. No pleural effusions. Degenerative changes involving the thoracic spine.  IMPRESSION: Mild CHF, with mild cardiomegaly and mild diffuse interstitial pulmonary edema.   Electronically Signed   By: Hulan Saas M.D.   On: 10/21/2013 21:38   Dg Abd Portable  1v  11/12/2013   CLINICAL DATA:  Abdominal distention.  Constipation.  EXAM: PORTABLE ABDOMEN - 1 VIEW  COMPARISON:  DG ABDOMEN ACUTE 2V W/ 1V CHEST dated 02/11/2012; DG HIP COMPLETE 2+V*R* dated 10/12/2012  FINDINGS: There is prominent formed stool throughout the colon. No dilated small bowel is observed.  There is prominent degenerative arthropathy of both hips. Possible flattening of the right femoral head.  IMPRESSION: 1.  Prominent stool throughout the colon favors constipation. 2. Indistinctness of the right hip joint. There is considerable degenerative arthropathy of both hips. Possible flattening of the right femoral head.   Electronically Signed   By: Herbie Baltimore M.D.   On: 11/12/2013 13:17    Microbiology: Recent Results (from the past 240 hour(s))  MRSA PCR SCREENING  Status: None   Collection Time    11/09/13  2:48 AM      Result Value Range Status   MRSA by PCR NEGATIVE  NEGATIVE Final   Comment:            The GeneXpert MRSA Assay (FDA     approved for NASAL specimens     only), is one component of a     comprehensive MRSA colonization     surveillance program. It is not     intended to diagnose MRSA     infection nor to guide or     monitor treatment for     MRSA infections.     Labs: Basic Metabolic Panel:  Recent Labs Lab 11/11/13 0830 11/12/13 1934  NA 133* 134*  K 5.6* 6.6*  CL 88* 92*  CO2 26 23  GLUCOSE 97 82  BUN 43* 31*  CREATININE 11.13* 7.95*  CALCIUM 7.9* 8.3*  PHOS 5.0*  --    Liver Function Tests:  Recent Labs Lab 11/11/13 0830  ALBUMIN 3.4*   No results found for this basename: LIPASE, AMYLASE,  in the last 168 hours No results found for this basename: AMMONIA,  in the last 168 hours CBC:  Recent Labs Lab 11/11/13 0830 11/12/13 1934  WBC 7.3 7.6  HGB 8.6* 10.0*  HCT 26.7* 30.7*  MCV 107.7* 108.5*  PLT 263 289   Cardiac Enzymes:  Recent Labs Lab 11/13/13 1208 11/16/13 0105  TROPONINI <0.30 <0.30   BNP: BNP (last 3  results)  Recent Labs  10/06/13 0334 10/21/13 2037 11/08/13 1903  PROBNP 31834.0* 23932.0* 42892.0*   CBG:  Recent Labs Lab 11/13/13 2057  GLUCAP 145*       Signed:  Christiane HaSULLIVAN,Janaysha Depaulo L, M.D.  [facility] 11/17/2013, 12:49 PM  Addendum:  Discharge rescheduled for 11/18/13 so that outpatient dialysis scheduled. Received dialysis today. Stable no change from 11/17/13  Crista Curborinna Tamotsu Wiederholt, M.D.

## 2013-11-18 ENCOUNTER — Other Ambulatory Visit: Payer: Self-pay | Admitting: *Deleted

## 2013-11-18 LAB — RENAL FUNCTION PANEL
Albumin: 3.5 g/dL (ref 3.5–5.2)
BUN: 74 mg/dL — AB (ref 6–23)
CALCIUM: 8.9 mg/dL (ref 8.4–10.5)
CO2: 23 mEq/L (ref 19–32)
CREATININE: 11.23 mg/dL — AB (ref 0.50–1.10)
Chloride: 88 mEq/L — ABNORMAL LOW (ref 96–112)
GFR calc Af Amer: 4 mL/min — ABNORMAL LOW (ref >90)
GFR, EST NON AFRICAN AMERICAN: 3 mL/min — AB (ref >90)
Glucose, Bld: 87 mg/dL (ref 70–99)
Phosphorus: 5.1 mg/dL — ABNORMAL HIGH (ref 2.3–4.6)
Potassium: 5.6 mEq/L — ABNORMAL HIGH (ref 3.7–5.3)
Sodium: 133 mEq/L — ABNORMAL LOW (ref 137–147)

## 2013-11-18 LAB — CBC
HEMATOCRIT: 20.2 % — AB (ref 36.0–46.0)
HEMATOCRIT: 26.8 % — AB (ref 36.0–46.0)
HEMOGLOBIN: 9.4 g/dL — AB (ref 12.0–15.0)
Hemoglobin: 7.1 g/dL — ABNORMAL LOW (ref 12.0–15.0)
MCH: 36.2 pg — AB (ref 26.0–34.0)
MCH: 35.6 pg — ABNORMAL HIGH (ref 26.0–34.0)
MCHC: 35.1 g/dL (ref 30.0–36.0)
MCHC: 35.1 g/dL (ref 30.0–36.0)
MCV: 103.1 fL — AB (ref 78.0–100.0)
MCV: 101.5 fL — AB (ref 78.0–100.0)
Platelets: 307 10*3/uL (ref 150–400)
Platelets: 277 10*3/uL (ref 150–400)
RBC: 1.96 MIL/uL — ABNORMAL LOW (ref 3.87–5.11)
RBC: 2.64 MIL/uL — ABNORMAL LOW (ref 3.87–5.11)
RDW: 13.9 % (ref 11.5–15.5)
RDW: 13.7 % (ref 11.5–15.5)
WBC: 8.1 10*3/uL (ref 4.0–10.5)
WBC: 6.6 10*3/uL (ref 4.0–10.5)

## 2013-11-18 MED ORDER — HEPARIN SODIUM (PORCINE) 1000 UNIT/ML DIALYSIS
20.0000 [IU]/kg | INTRAMUSCULAR | Status: DC | PRN
  Administered 2013-11-18: 2200 [IU] via INTRAVENOUS_CENTRAL

## 2013-11-18 MED ORDER — LORAZEPAM 0.5 MG PO TABS: 0.5000 mg | ORAL_TABLET | Freq: Four times a day (QID) | ORAL | Status: DC | PRN

## 2013-11-18 MED ORDER — PENTAFLUOROPROP-TETRAFLUOROETH EX AERO
INHALATION_SPRAY | CUTANEOUS | Status: AC
  Administered 2013-11-18: 08:00:00
  Filled 2013-11-18: qty 103.5

## 2013-11-18 NOTE — Progress Notes (Signed)
11/18/2013 4:22 PM Hemodialysis Outpatient Note-update: just to confirm the patient will be a Tuesday, Thursday and Saturday [facility] and her chair time will be 7:15 AM. Thank you. Tilman NeatKrzywonos, Shayne Diguglielmo H

## 2013-11-18 NOTE — Telephone Encounter (Signed)
[facility] [place] 

## 2013-11-18 NOTE — Progress Notes (Signed)
Admit: 11/12/2013 LOS: 6  53[facility], chest pain.  Discharged to shelter but returned to ED sameday.  Now awaiting placement in local SNF and clipping to local HD unit.   Subjective:  Seen on HD this AM- [facility] and [facility]/12/  02/09 0701 - 02/10 0700 In: 1080 [P.O.:1080] Out: -   Filed Weights   11/16/13 1950 11/17/13 2042 11/18/13 [id number]  Weight: 111.2 kg (245 lb 2.4 oz) 112.5 kg (248 lb 0.3 oz) 113.4 kg (250 lb)    Current meds: reviewed  Current Labs: reviewed    Physical Exam:  Blood pressure 134/71, pulse 94, temperature 98.2 F (36.8 C), temperature source Oral, resp. rate 16, height 5\' 4"  (1.626 m), weight 113.4 kg (250 lb), SpO2 97.00%.  Assessment/Plan 1. ESRD: keep on TTS schedule via AVF- next due Thursday.  [facility]- Hopefully with some structure, she will be able to be more compliant with her treatments 2. Hyperkalemia: resolved with HD 3. 2HPTH: cont sensipar, Renvela- will check PTH with HD tomorrow- none in system 4. HIV: on ARVs when doesn't refuse 5. Anemia: 2 values very different- will check hgb tomorrow and add ESA according to what it shows- hgb is 9.4  [name] A  11/18/2013, 9:50 AM   Recent Labs Lab 11/12/13 1934 11/18/13 0701  NA 134* 133*  K 6.6* 5.6*  CL 92* 88*  CO2 23 23  GLUCOSE 82 87  BUN 31* 74*  CREATININE 7.95* 11.23*  CALCIUM 8.3* 8.9  PHOS  --  5.1*    Recent Labs Lab 11/12/13 1934 11/18/13 0701 11/18/13 0815  WBC 7.6 8.1 6.6  HGB 10.0* 7.1* 9.4*  HCT 30.7* 20.2* 26.8*  MCV 108.5* 103.1* 101.5*  PLT 289 307 277

## 2013-11-18 NOTE — Progress Notes (Signed)
D/C'd via ambulance to [facility]. All belongings sent with patient.

## 2013-11-18 NOTE — Progress Notes (Addendum)
11/18/2013 11:57 AM  Spoke with Darel HongJudy in HD this am who confirmed that patient has in fact been accepted at [facility] for outpatient dialysis TTS second shift.  Dr. Lendell CapriceSullivan and [name] with Social Work notified. Plan to discharge to SNF today. Theadora RamaKIRKMAN, Armeda Plumb Brooke

## 2013-11-18 NOTE — Procedures (Signed)
Patient was seen on dialysis and the procedure was supervised.  BFR 400  Via AVF BP is  134/71.   Patient appears to be tolerating treatment well  Shaeleigh Graw A 11/18/2013

## 2013-11-19 ENCOUNTER — Non-Acute Institutional Stay (SKILLED_NURSING_FACILITY): Payer: Medicare Other | Admitting: Internal Medicine

## 2013-11-19 ENCOUNTER — Encounter: Payer: Self-pay | Admitting: Internal Medicine

## 2013-11-19 DIAGNOSIS — N039 Chronic nephritic syndrome with unspecified morphologic changes: Secondary | ICD-10-CM

## 2013-11-19 DIAGNOSIS — I509 Heart failure, unspecified: Secondary | ICD-10-CM

## 2013-11-19 DIAGNOSIS — R079 Chest pain, unspecified: Secondary | ICD-10-CM

## 2013-11-19 DIAGNOSIS — K5900 Constipation, unspecified: Secondary | ICD-10-CM

## 2013-11-19 DIAGNOSIS — Z21 Asymptomatic human immunodeficiency virus [HIV] infection status: Secondary | ICD-10-CM

## 2013-11-19 DIAGNOSIS — R32 Unspecified urinary incontinence: Secondary | ICD-10-CM

## 2013-11-19 DIAGNOSIS — M1710 Unilateral primary osteoarthritis, unspecified knee: Secondary | ICD-10-CM

## 2013-11-19 DIAGNOSIS — M25569 Pain in unspecified knee: Secondary | ICD-10-CM

## 2013-11-19 DIAGNOSIS — B20 Human immunodeficiency virus [HIV] disease: Secondary | ICD-10-CM

## 2013-11-19 DIAGNOSIS — N189 Chronic kidney disease, unspecified: Secondary | ICD-10-CM

## 2013-11-19 DIAGNOSIS — E875 Hyperkalemia: Secondary | ICD-10-CM

## 2013-11-19 DIAGNOSIS — J810 Acute pulmonary edema: Secondary | ICD-10-CM

## 2013-11-19 DIAGNOSIS — M25561 Pain in right knee: Secondary | ICD-10-CM

## 2013-11-19 DIAGNOSIS — Y92009 Unspecified place in unspecified non-institutional (private) residence as the place of occurrence of the external cause: Secondary | ICD-10-CM

## 2013-11-19 DIAGNOSIS — W19XXXA Unspecified fall, initial encounter: Secondary | ICD-10-CM

## 2013-11-19 DIAGNOSIS — I10 Essential (primary) hypertension: Secondary | ICD-10-CM

## 2013-11-19 DIAGNOSIS — M25562 Pain in left knee: Secondary | ICD-10-CM

## 2013-11-19 DIAGNOSIS — Z992 Dependence on renal dialysis: Secondary | ICD-10-CM

## 2013-11-19 DIAGNOSIS — K2970 Gastritis, unspecified, without bleeding: Secondary | ICD-10-CM

## 2013-11-19 DIAGNOSIS — K8020 Calculus of gallbladder without cholecystitis without obstruction: Secondary | ICD-10-CM

## 2013-11-19 DIAGNOSIS — F17200 Nicotine dependence, unspecified, uncomplicated: Secondary | ICD-10-CM

## 2013-11-19 DIAGNOSIS — N186 End stage renal disease: Secondary | ICD-10-CM

## 2013-11-19 DIAGNOSIS — M179 Osteoarthritis of knee, unspecified: Secondary | ICD-10-CM

## 2013-11-19 DIAGNOSIS — N2581 Secondary hyperparathyroidism of renal origin: Secondary | ICD-10-CM

## 2013-11-19 DIAGNOSIS — K2990 Gastroduodenitis, unspecified, without bleeding: Secondary | ICD-10-CM

## 2013-11-19 DIAGNOSIS — IMO0002 Reserved for concepts with insufficient information to code with codable children: Secondary | ICD-10-CM

## 2013-11-19 DIAGNOSIS — D631 Anemia in chronic kidney disease: Secondary | ICD-10-CM

## 2013-11-19 LAB — PTH, INTACT AND CALCIUM
Calcium, Total (PTH): 8.8 mg/dL (ref 8.4–10.5)
PTH: 1500.9 pg/mL — ABNORMAL HIGH (ref 14.0–72.0)

## 2013-11-19 NOTE — Progress Notes (Signed)
Patient ID: [name], female   DOB: May 08, 1951, 63 y.o.   MRN: [id number]  Provider:  Gwenith Spitz. Renato Gails, D.O., C.M.D. Location:  [facility]  PCP: No PCP Per Patient  Dr. Renato Gails while in [place]   Code Status: full code   Allergies  Allergen Reactions  . Tramadol     "Gives me bad dreams"  . Morphine And Related Hives and Rash    Chief Complaint  Patient presents with  . Hospitalization Follow-up    new admission s/p hospitalization    HPI: 63 y.o. female with h/o HIV on HAART, hypertension, CKD on ESRD previously getting HD in [facility], osteoarthritis of bilateral hips and right knee was seen for SNF admission after hospital stay first for pulmonary edema, then for noncardiac chest pain.  She was admitted to [facility] from 1/13-1/20/2015 for acute pulmonary edema, most likely from missing hemodialysis.  She had been offered discharge to SNF but refused and went to a homeless shelter.  At the shelter, she became anxious and went back to [facility] on 2/4 for chest pain, found to be non cardiac.  Cardiac enzymes negative x3, CXR negative.  She was discharged to [facility] on 11/18/2013.  Had been receiving HD on M-W-F in [place].  While in hospital, infectious diseases was consulted and pt requires ID and psychiatry follow-up.  ROS: Review of Systems  Constitutional: Negative for fever and chills.  HENT: Negative for congestion and hearing loss.   Eyes: Negative for blurred vision.  Respiratory: Negative for shortness of breath.   Cardiovascular: Negative for chest pain and leg swelling.  Gastrointestinal: Positive for heartburn and constipation. Negative for blood in stool and melena.  Genitourinary:       Incontinence by history  Musculoskeletal: Positive for falls and joint pain.  Skin: Negative for rash.  Neurological: Negative for dizziness and weakness.  Endo/Heme/Allergies: Bruises/bleeds easily.    Psychiatric/Behavioral: Positive for substance abuse. The patient is nervous/anxious.      Past Medical History  Diagnosis Date  . Hypertension   . HIV infection   . Chronic renal failure   . CHF (congestive heart failure)   . ESRD (end stage renal disease) on dialysis 09/30/2013    On dialysis at [facility], per [facility] physicians.  Started dialysis in 2008.  Takes HD MWF schedule.     Past Surgical History  Procedure Laterality Date  . Arteriovenous graft placement     Social History:   reports that she has been smoking.  She does not have any smokeless tobacco history on file. She reports that she drinks alcohol. She reports that she uses illicit drugs (Marijuana) about once per week.  No family history on file.  Medications: Patient's Medications  New Prescriptions   No medications on file  Previous Medications   AMLODIPINE (NORVASC) 5 MG TABLET    Take 1 tablet (5 mg total) by mouth at bedtime.   ASPIRIN EC 81 MG EC TABLET    Take 1 tablet (81 mg total) by mouth daily.   CARVEDILOL (COREG) 3.125 MG TABLET    Take 1 tablet (3.125 mg total) by mouth 2 (two) times daily with a meal.   CINACALCET (SENSIPAR) 60 MG TABLET    Take 60 mg by mouth daily.   DIPHENHYDRAMINE (BENADRYL) 50 MG CAPSULE    Take 1 capsule (50 mg total) by mouth at bedtime.   FLUOXETINE (PROZAC) 20 MG CAPSULE  Take 1 capsule (20 mg total) by mouth daily.   HYDROXYZINE (ATARAX/VISTARIL) 25 MG TABLET    Take 1 tablet (25 mg total) by mouth 3 (three) times daily.   LAMIVUDINE (EPIVIR) 10 MG/ML SOLUTION    Take 5 mLs (50 mg total) by mouth daily.   LORAZEPAM (ATIVAN) 0.5 MG TABLET    Take 1 tablet (0.5 mg total) by mouth every 6 (six) hours as needed (agitation).   MULTIVITAMIN (RENA-VIT) TABS TABLET    Take 1 tablet by mouth at bedtime.   NICOTINE (NICODERM CQ - DOSED IN MG/24 HOURS) 21 MG/24HR PATCH    Place 1 patch (21 mg total) onto the skin daily.   OMEPRAZOLE (PRILOSEC) 20 MG CAPSULE    Take 20 mg by mouth  daily.   RALTEGRAVIR (ISENTRESS) 400 MG TABLET    Take 400 mg by mouth 2 (two) times daily.    SEVELAMER CARBONATE (RENVELA) 800 MG TABLET    Take 1,600-2,400 mg by mouth 5 (five) times daily. 3 Tabs with meals and 2 tabs with snacks   TENOFOVIR (VIREAD) 300 MG TABLET    Take 300 mg by mouth every Friday. Takes on Fridays.  Modified Medications   No medications on file  Discontinued Medications   No medications on file     Physical Exam: Filed Vitals:   11/19/13 1107  BP: 116/78  Pulse: 94  Temp: 99.2 F (37.3 C)  Resp: 20  SpO2: 98%  Physical Exam  Constitutional: She is oriented to person, place, and time. No distress.  Obese black female resting in bed   HENT:  Head: Normocephalic and atraumatic.  Right Ear: External ear normal.  Left Ear: External ear normal.  Nose: Nose normal.  Mouth/Throat: Oropharynx is clear and moist.  Eyes: Conjunctivae and EOM are normal. Pupils are equal, round, and reactive to light.  Wears glasses  Neck: Normal range of motion. Neck supple. No JVD present.  Cardiovascular: Normal rate, regular rhythm and intact distal pulses.   Murmur heard. HD access left forearm with normal bruit, thrill  Pulmonary/Chest: Effort normal and breath sounds normal. No respiratory distress. She has no rales.  Abdominal: Soft. Bowel sounds are normal. She exhibits no distension and no mass. There is no tenderness.  Musculoskeletal: She exhibits tenderness.  Bilateral hips tender at groin and right knee tender with edema and crepitus present  Neurological: She is alert and oriented to person, place, and time.  Skin: Skin is warm and dry.  Psychiatric:  Flat affect     Labs reviewed: Basic Metabolic Panel:  Recent Labs  59/56/38 0800  11/11/13 0830 11/12/13 1934 11/18/13 0701  NA 134*  < > 133* 134* 133*  K 5.4*  < > 5.6* 6.6* 5.6*  CL 88*  < > 88* 92* 88*  CO2 24  < > 26 23 23   GLUCOSE 86  < > 97 82 87  BUN 67*  < > 43* 31* 74*  CREATININE 11.74*   < > 11.13* 7.95* 11.23*  CALCIUM 8.8  < > 7.9* 8.3* 8.9  8.8  PHOS 6.0*  --  5.0*  --  5.1*  < > = values in this interval not displayed. Liver Function Tests:  Recent Labs  11/09/13 0537 11/11/13 0830 11/18/13 0701  AST 12  --   --   ALT 6  --   --   ALKPHOS 301*  --   --   BILITOT 0.3  --   --  PROT 7.7  --   --   ALBUMIN 3.5 3.4* 3.5  CBC:  Recent Labs  10/06/13 0337 10/21/13 2037  11/08/13 1853  11/12/13 1934 11/18/13 0701 11/18/13 0815  WBC 8.3 7.1  < > 9.3  < > 7.6 8.1 6.6  NEUTROABS 4.4 4.2  --  5.0  --   --   --   --   HGB 9.2* 10.0*  < > 9.7*  < > 10.0* 7.1* 9.4*  HCT 27.6* 30.1*  < > 29.1*  < > 30.7* 20.2* 26.8*  MCV 104.5* 105.2*  < > 106.2*  < > 108.5* 103.1* 101.5*  PLT 271 250  < > 311  < > 289 307 277  < > = values in this interval not displayed. Cardiac Enzymes:  Recent Labs  12/11/12 1055  11/09/13 0537 11/13/13 1208 11/16/13 0105  CKTOTAL 62  --   --   --   --   CKMB 3.0  --   --   --   --   TROPONINI  --   < > <0.30 <0.30 <0.30  < > = values in this interval not displayed. CBG:  Recent Labs  12/11/12 1748 11/08/13 2045 11/13/13 2057  GLUCAP 174* 109* 145*   Imaging and Procedures: 11/08/2013 CXR No evidence of pulmonary edema currently. Vascular congestion with mild bronchitic change. Bilateral infiltrates seen on the prior study have essentially resolved and may have been due to either pneumonia or atelectasis.   10/21/2013 CXR Mild CHF, with mild cardiomegaly and mild diffuse interstitial pulmonary edema.   Dg Abd Portable 1v  11/12/2013  1. Prominent stool throughout the colon favors constipation. 2. Indistinctness of the right hip joint. There is considerable degenerative arthropathy of both hips. Possible flattening of the right femoral head.   Assessment/Plan HIV infection Following now with [facility].  Keep f/u planned with Dr. Orvan Falconerampbell.  Cont HAART and check liver panel next week.    ESRD (end stage renal  disease) Continue HD.  Now going [place] while here.  Had just been to the orientation for it today.  Access is in left forearm.    Hypertension BP at goal during visit.  Cont current medications and monitor.    CHF (congestive heart failure) Had recent episode of acute pulmonary edema associated with [place]ad[place] [facility];  Has resolved  Hyperkalemia Being monitored through HD.  Fall at home H/o this in the past.  Is now homeless.  Was staying in shelter.  Bilateral knee pain Due to osteoarthritis, obesity.  Will add voltaren gel.  Avoid narcotics due to h/o abuse and allergies.  Pulmonary edema, acute Resolved, was due to missed HD, chronic renal failure.    ESRD (end stage renal disease) on dialysis Now [facility] [place] here.  Chest pain Resolved.  Felt to be anxiety-related after she was discharged to shelter from hospital.    B[facility] calculi [facility]  CN (constipation) Added senna s and miralax on an as needed basis for constipation.  Gastritis Continues on prilosec  Absence of bladder continence Is noted--? Still makes urine at this point since on HD.  Compulsive tobacco user syndrome nicoderm while in snf unless she begins smoking on outings, then would d/c, must taper down on a weekly basis to off   Hyperparathyroidism due to renal insufficiency Cont renvela, renavite, sensipar.   Arthritis of knee, degenerative Added voltaren  gel qid for right knee and bilateral hips.  Avoid narcotics due to h/o abuse and allergies.  Anemia secondary to renal failure F/u cbc next week.   Functional status: independent in ADLs  Family/ staff Communication: discussed plan of care with the patient and her nurses  Labs/tests ordered:  Cbc, liver panel next week (due to HAART), f/u with ID and order written for [facility] at facility

## 2013-11-19 NOTE — Discharge Summary (Addendum)
Physician Discharge Summary  Jean CooleyMargaret Ann [name] ZOX:[id number] DOB: 1951/04/22 DOA: 09/29/2013      LEFT AMA PCP: No PCP Per Patient  Admit date: 09/29/2013 Discharge date: 10/01/2014  Time spent: 10 minutes  Recommendations for Outpatient Follow-up:  1. None  Discharge Diagnoses:  Principal Problem:   Pulmonary edema, acute Active Problems:   HIV infection   Hypertension   Anemia   Hyperkalemia   ESRD (end stage renal disease) on dialysis   Pulmonary edema    Diet recommendation: none  Filed Weights   09/30/13 1438 09/30/13 2110 10/01/13 0600  Weight: 113.3 kg (249 lb 12.5 oz) 113 kg (249 lb 1.9 oz) 113.5 kg (250 lb 3.6 oz)    History of present illness:  63 y.o. female with known history of ESRD on hemodialysis, HIV, chronic anemia, hypertension presented to the ER because of shortness of breath. Patient has been feeling short of breath last 24 hours. Patient's shortness of breath increased on exertion denies any chest pain fever chills. Has drank and productive cough. In the ER chest x-ray shows features consistent with pulmonary edema. Patient states she has not had dialysis for last one week. Patient's labs also revealed elevated potassium around 6.2. EKG does not show any peaked T waves. Patient has been admitted for further management. Patient is not in acute distress. Denies any nausea vomiting abdominal pain.    Hospital Course:  Acute pulmonary edema due to noncompliance of her HD: - She does admit to being noncompliant with her dialysis and has missed a few sessions.  - Urgent HD with UF (pt is 6kg above EDW). - After initial treatment pt decided to leave AMA.  ESRD  - As above,  but may need extra treatment tomorrow if still with volume overload and HTN.   Procedures:  CXR  Consultations:  Renal  Discharge Exam: Filed Vitals:   10/01/13 0600  BP: 95/59  Pulse: 93  Temp: 98.5 F (36.9 C)  Resp: 19    General: refused  Discharge  Instructions     Medication List    ASK your doctor about these medications       amitriptyline 10 MG tablet  Commonly known as:  ELAVIL  Take 20 mg by mouth at bedtime.     amLODipine 10 MG tablet  Commonly known as:  NORVASC  Take 10 mg by mouth daily.     carvedilol 6.25 MG tablet  Commonly known as:  COREG  Take 6.25 mg by mouth 2 (two) times daily with a meal.     cinacalcet 60 MG tablet  Commonly known as:  SENSIPAR  Take 60 mg by mouth daily.     lamiVUDine 150 MG tablet  Commonly known as:  EPIVIR  Take 150 mg by mouth daily before breakfast.     omeprazole 20 MG capsule  Commonly known as:  PRILOSEC  Take 20 mg by mouth daily.     raltegravir 400 MG tablet  Commonly known as:  ISENTRESS  Take 400 mg by mouth 2 (two) times daily.     sevelamer carbonate 800 MG tablet  Commonly known as:  RENVELA  Take 1,600-2,400 mg by mouth 5 (five) times daily. 3 Tabs with meals and 2 tabs with snacks     tenofovir 300 MG tablet  Commonly known as:  VIREAD  Take 300 mg by mouth every Friday. Takes on Fridays.       Allergies  Allergen Reactions  . Tramadol     "  Gives me bad dreams"  . Morphine And Related Hives and Rash      The results of significant diagnostics from this hospitalization (including imaging, microbiology, ancillary and laboratory) are listed below for reference.    Significant Diagnostic Studies: Dg Chest 2 View  11/08/2013   CLINICAL DATA:  Recent pneumonia, dialysis patient, patient depressed  EXAM: CHEST  2 VIEW  COMPARISON:  10/21/2013  FINDINGS: Stable mild cardiomegaly and uncoiling of the aorta. Vascular pattern normal. Mild central bronchitic change is stable. Areas of discoid atelectasis have improved. No consolidation or effusion.  IMPRESSION: No evidence of pulmonary edema currently. Vascular congestion with mild bronchitic change. Bilateral infiltrates seen on the prior study have essentially resolved and may have been due to either  pneumonia or atelectasis.   Electronically Signed   By: Esperanza Heir M.D.   On: 11/08/2013 20:07   Dg Chest 2 View  10/21/2013   CLINICAL DATA:  Cough. Shortness of breath. chest pain. Current history of HIV, end-stage renal disease, and hypertension.  EXAM: CHEST  2 VIEW  COMPARISON:  Portable examinations 10/06/2013, 05/25/2013. Two-view chest x-ray 09/29/2013, 07/31/2013.  FINDINGS: Cardiac silhouette mildly enlarged but stable. Mild diffuse interstitial pulmonary edema, best visualized on the lateral image. No confluent airspace consolidation. Linear atelectasis in the lower lobes and the inferior upper lobes bilaterally. No pleural effusions. Degenerative changes involving the thoracic spine.  IMPRESSION: Mild CHF, with mild cardiomegaly and mild diffuse interstitial pulmonary edema.   Electronically Signed   By: Hulan Saas M.D.   On: 10/21/2013 21:38   Dg Abd Portable 1v  11/12/2013   CLINICAL DATA:  Abdominal distention.  Constipation.  EXAM: PORTABLE ABDOMEN - 1 VIEW  COMPARISON:  DG ABDOMEN ACUTE 2V W/ 1V CHEST dated 02/11/2012; DG HIP COMPLETE 2+V*R* dated 10/12/2012  FINDINGS: There is prominent formed stool throughout the colon. No dilated small bowel is observed.  There is prominent degenerative arthropathy of both hips. Possible flattening of the right femoral head.  IMPRESSION: 1.  Prominent stool throughout the colon favors constipation. 2. Indistinctness of the right hip joint. There is considerable degenerative arthropathy of both hips. Possible flattening of the right femoral head.   Electronically Signed   By: Herbie Baltimore M.D.   On: 11/12/2013 13:17    Microbiology: No results found for this or any previous visit (from the past 240 hour(s)).   Labs: Basic Metabolic Panel:  Recent Labs Lab 11/12/13 1934 11/18/13 0701  NA 134* 133*  K 6.6* 5.6*  CL 92* 88*  CO2 23 23  GLUCOSE 82 87  BUN 31* 74*  CREATININE 7.95* 11.23*  CALCIUM 8.3* 8.9  8.8  PHOS  --  5.1*    Liver Function Tests:  Recent Labs Lab 11/18/13 0701  ALBUMIN 3.5   No results found for this basename: LIPASE, AMYLASE,  in the last 168 hours No results found for this basename: AMMONIA,  in the last 168 hours CBC:  Recent Labs Lab 11/12/13 1934 11/18/13 0701 11/18/13 0815  WBC 7.6 8.1 6.6  HGB 10.0* 7.1* 9.4*  HCT 30.7* 20.2* 26.8*  MCV 108.5* 103.1* 101.5*  PLT 289 307 277   Cardiac Enzymes:  Recent Labs Lab 11/13/13 1208 11/16/13 0105  TROPONINI <0.30 <0.30   BNP: BNP (last 3 results)  Recent Labs  10/06/13 0334 10/21/13 2037 11/08/13 1903  PROBNP 31834.0* 23932.0* 42892.0*   CBG:  Recent Labs Lab 11/13/13 2057  GLUCAP 145*       Signed:  Marinda Elk  [facility] 11/19/2013, 7:22 PM

## 2013-11-20 NOTE — Assessment & Plan Note (Signed)
Previously noted at [facility]

## 2013-11-20 NOTE — Assessment & Plan Note (Signed)
Added voltaren gel qid for right knee and bilateral hips.  Avoid narcotics due to h/o abuse and allergies.

## 2013-11-20 NOTE — Assessment & Plan Note (Signed)
Is noted--? Still makes urine at this point since on HD.

## 2013-11-20 NOTE — Assessment & Plan Note (Addendum)
Continue HD.  Now going [place] new [place] while here.  Had just been to the orientation for it today.  Access is in left forearm.

## 2013-11-20 NOTE — Assessment & Plan Note (Signed)
Cont renvela, renavite, sensipar.

## 2013-11-20 NOTE — Assessment & Plan Note (Signed)
F/u cbc next week.

## 2013-11-20 NOTE — Assessment & Plan Note (Signed)
[place] while here.

## 2013-11-20 NOTE — Assessment & Plan Note (Signed)
BP at goal during visit.  Cont current medications and monitor.

## 2013-11-20 NOTE — Assessment & Plan Note (Signed)
Following now with Infectious Disease at [facility].  Keep f/u planned with Dr. Orvan Falconerampbell.  Cont HAART and check liver panel next week.

## 2013-11-20 NOTE — Assessment & Plan Note (Signed)
Resolved.  Felt to be anxiety-related after she was discharged to shelter from hospital.

## 2013-11-20 NOTE — Assessment & Plan Note (Signed)
nicoderm while in snf unless she begins smoking on outings, then would d/c, must taper down on a weekly basis to off

## 2013-11-20 NOTE — Assessment & Plan Note (Signed)
Being monitored through HD.

## 2013-11-20 NOTE — Assessment & Plan Note (Signed)
Resolved, was due to missed HD, chronic renal failure.

## 2013-11-20 NOTE — Assessment & Plan Note (Signed)
Due to osteoarthritis, obesity.  Will add voltaren gel.  Avoid narcotics due to h/o abuse and allergies.

## 2013-11-20 NOTE — Assessment & Plan Note (Signed)
Added senna s and miralax on an as needed basis for constipation.

## 2013-11-20 NOTE — Assessment & Plan Note (Signed)
Continues on prilosec.  

## 2013-11-20 NOTE — Assessment & Plan Note (Signed)
H/o this in the past.  Is now homeless.  Was staying in shelter.

## 2013-11-20 NOTE — Assessment & Plan Note (Signed)
Had recent episode of acute pulmonary edema associated with [place] [facility] [place] [facility] [place] [facility];  Has resolved

## 2013-12-17 ENCOUNTER — Non-Acute Institutional Stay (SKILLED_NURSING_FACILITY): Payer: Medicare Other | Admitting: Internal Medicine

## 2013-12-17 ENCOUNTER — Encounter: Payer: Self-pay | Admitting: Internal Medicine

## 2013-12-17 DIAGNOSIS — N186 End stage renal disease: Secondary | ICD-10-CM

## 2013-12-17 DIAGNOSIS — F1920 Other psychoactive substance dependence, uncomplicated: Secondary | ICD-10-CM | POA: Insufficient documentation

## 2013-12-17 DIAGNOSIS — M169 Osteoarthritis of hip, unspecified: Secondary | ICD-10-CM

## 2013-12-17 DIAGNOSIS — M1611 Unilateral primary osteoarthritis, right hip: Secondary | ICD-10-CM

## 2013-12-17 DIAGNOSIS — M1610 Unilateral primary osteoarthritis, unspecified hip: Secondary | ICD-10-CM

## 2013-12-17 DIAGNOSIS — F1120 Opioid dependence, uncomplicated: Secondary | ICD-10-CM

## 2013-12-17 NOTE — Progress Notes (Signed)
Patient ID: [name], female   DOB: 20-May-1951, 63 y.o.   MRN: [id number] [facility] [name], D.O., C.M.D.   DOB 20-May-1951  Code status: full  HPI:  63 y.o [name] was recently hospitalized at [facility] 2/4/-11/18/13 for hyperkalemia (K 6.6) requring dialysis (ESRD no HD TThSat), constipation, imaging (11/12/13) notable for degenerative changes in hips and possible flattening of femoral head, noncardiac chest pain (cardiac enzymes negative x 3).  [facility].  PMH:  HIV, HTN, ESRD on HD (TThSat), CHF, alcohol/marijuana/cocaine abuse, major depression, metabolic bone disease, tobacco abuse, constipation, SOB/fever, anemia, pulmonary edema, homelessnesss  Meds:  Norvasc 5, asprin 81, Coreg 3.125 mg bid, Cinacalcet 60 mg, Benadryl 50 mg qhs, Fluoxetine 20 mg daily, Hydroxyzine 25 mg tid, Lamivudine 50 mg daily, Ativan 0.5 mg q6 hours, multivitamin, Nicotine patch 21, Prilosec 20, Raltegravir 400 mg bid, Renvela bid, Tenofovir 300 mg every Friday  Allergies: Morphine: hives and rash Ultram: bad dreams   Subjective: She complains of right hip pain 10/10.  Pain is chronic x years and hurts daily.  She cant walk due to pain and pain in her right knee due to no cartilage present in her knee.  She states Tylenol does not help only Percocet but no one will give her Percocet due to former drug history.  She has tried [employer] in the past as well which helps   Objective:  Physical exam:  General: irritatble  HEENT: [place]/at [facility]: pain with palpation right hip; pain with ROM when extending lower leg 30 degrees Neuro: CN 2-12 grossly intact, in wheelchair   Assessment/Plan:  1. [facility] Pain right hip -likely osteoarthritis noted on imaging 11/12/13 -can try Tylenol max 3 grams but patient refuses tylenol because it does not help.  She states Percocet is the only medication that helps but no one will prescribe it to  her because she previously used drugs  -[place] -She will continue PT which is helping  2. H/o polysubstance abuse with narcotics in the past so I do not want to prescribe her percocet that she is demanding 3.  ESRD--cont HD, limits pain med choices including voltaren gel though only 10% absorption

## 2014-01-06 ENCOUNTER — Emergency Department (HOSPITAL_COMMUNITY): Payer: Medicare Other

## 2014-01-06 ENCOUNTER — Encounter (HOSPITAL_COMMUNITY): Payer: Self-pay | Admitting: Emergency Medicine

## 2014-01-06 ENCOUNTER — Emergency Department (HOSPITAL_COMMUNITY)
Admission: EM | Admit: 2014-01-06 | Discharge: 2014-01-06 | Disposition: A | Discharge status: Home / Self Care | Payer: Medicare Other | Attending: Emergency Medicine | Admitting: Emergency Medicine

## 2014-01-06 DIAGNOSIS — I120 Hypertensive chronic kidney disease with stage 5 chronic kidney disease or end stage renal disease: Secondary | ICD-10-CM | POA: Insufficient documentation

## 2014-01-06 DIAGNOSIS — I509 Heart failure, unspecified: Secondary | ICD-10-CM | POA: Insufficient documentation

## 2014-01-06 DIAGNOSIS — Z992 Dependence on renal dialysis: Secondary | ICD-10-CM | POA: Insufficient documentation

## 2014-01-06 DIAGNOSIS — R05 Cough: Secondary | ICD-10-CM | POA: Insufficient documentation

## 2014-01-06 DIAGNOSIS — R059 Cough, unspecified: Secondary | ICD-10-CM | POA: Insufficient documentation

## 2014-01-06 DIAGNOSIS — E875 Hyperkalemia: Secondary | ICD-10-CM | POA: Insufficient documentation

## 2014-01-06 DIAGNOSIS — F17200 Nicotine dependence, unspecified, uncomplicated: Secondary | ICD-10-CM | POA: Insufficient documentation

## 2014-01-06 DIAGNOSIS — R0602 Shortness of breath: Secondary | ICD-10-CM | POA: Insufficient documentation

## 2014-01-06 DIAGNOSIS — M7989 Other specified soft tissue disorders: Secondary | ICD-10-CM | POA: Insufficient documentation

## 2014-01-06 DIAGNOSIS — E669 Obesity, unspecified: Secondary | ICD-10-CM | POA: Insufficient documentation

## 2014-01-06 DIAGNOSIS — Z79899 Other long term (current) drug therapy: Secondary | ICD-10-CM | POA: Insufficient documentation

## 2014-01-06 DIAGNOSIS — Z21 Asymptomatic human immunodeficiency virus [HIV] infection status: Secondary | ICD-10-CM | POA: Insufficient documentation

## 2014-01-06 DIAGNOSIS — Z7982 Long term (current) use of aspirin: Secondary | ICD-10-CM | POA: Insufficient documentation

## 2014-01-06 DIAGNOSIS — N186 End stage renal disease: Secondary | ICD-10-CM | POA: Insufficient documentation

## 2014-01-06 DIAGNOSIS — R0789 Other chest pain: Secondary | ICD-10-CM | POA: Insufficient documentation

## 2014-01-06 LAB — BASIC METABOLIC PANEL
BUN: 54 mg/dL — AB (ref 6–23)
CO2: 22 mEq/L (ref 19–32)
Calcium: 9.1 mg/dL (ref 8.4–10.5)
Chloride: 89 mEq/L — ABNORMAL LOW (ref 96–112)
Creatinine, Ser: 9.52 mg/dL — ABNORMAL HIGH (ref 0.50–1.10)
GFR calc Af Amer: 5 mL/min — ABNORMAL LOW (ref >90)
GFR, EST NON AFRICAN AMERICAN: 4 mL/min — AB (ref >90)
Glucose, Bld: 92 mg/dL (ref 70–99)
POTASSIUM: 7 meq/L — AB (ref 3.7–5.3)
Sodium: 130 mEq/L — ABNORMAL LOW (ref 137–147)

## 2014-01-06 LAB — CBC WITH DIFFERENTIAL/PLATELET
BASOS PCT: 0 % (ref 0–1)
Basophils Absolute: 0 10*3/uL (ref 0.0–0.1)
EOS ABS: 0.1 10*3/uL (ref 0.0–0.7)
EOS PCT: 2 % (ref 0–5)
HCT: 32.1 % — ABNORMAL LOW (ref 36.0–46.0)
HEMOGLOBIN: 11 g/dL — AB (ref 12.0–15.0)
LYMPHS ABS: 2.6 10*3/uL (ref 0.7–4.0)
Lymphocytes Relative: 30 % (ref 12–46)
MCH: 35.7 pg — AB (ref 26.0–34.0)
MCHC: 34.3 g/dL (ref 30.0–36.0)
MCV: 104.2 fL — ABNORMAL HIGH (ref 78.0–100.0)
MONOS PCT: 7 % (ref 3–12)
Monocytes Absolute: 0.6 10*3/uL (ref 0.1–1.0)
NEUTROS PCT: 61 % (ref 43–77)
Neutro Abs: 5.4 10*3/uL (ref 1.7–7.7)
Platelets: 285 10*3/uL (ref 150–400)
RBC: 3.08 MIL/uL — AB (ref 3.87–5.11)
RDW: 13.5 % (ref 11.5–15.5)
WBC: 8.8 10*3/uL (ref 4.0–10.5)

## 2014-01-06 LAB — TROPONIN I: Troponin I: <0.3 ng/mL (ref <0.30)

## 2014-01-06 MED ORDER — SODIUM POLYSTYRENE SULFONATE 15 GM/60ML PO SUSP: 30.0000 g | Freq: Once | ORAL | Status: DC

## 2014-01-06 MED ORDER — INSULIN ASPART 100 UNIT/ML ~~LOC~~ SOLN: 10.0000 [IU] | Freq: Once | SUBCUTANEOUS | Status: DC

## 2014-01-06 MED ORDER — ASPIRIN 81 MG PO CHEW: 324.0000 mg | CHEWABLE_TABLET | Freq: Once | ORAL | Status: DC

## 2014-01-06 MED ORDER — ONDANSETRON 4 MG PO TBDP
8.0000 mg | ORAL_TABLET | Freq: Once | ORAL | Status: AC
  Administered 2014-01-06: 8 mg via ORAL
  Filled 2014-01-06: qty 2

## 2014-01-06 MED ORDER — DEXTROSE 50 % IV SOLN: 1.0000 | Freq: Once | INTRAVENOUS | Status: DC

## 2014-01-06 NOTE — ED Provider Notes (Signed)
CSN: [id number]     Arrival date & time 01/06/14  0119 History   First MD Initiated Contact with Patient 01/06/14 0121     Chief Complaint  Patient presents with  . Shortness of Breath     (Consider location/radiation/quality/duration/timing/severity/associated sxs/prior Treatment) HPI  This is a 63 year old female with a history of hypertension, end-stage renal disease on dialysis Tuesday, Thursday, and Saturday who presents with shortness of breath.  Patient arrived by EMS from [facility] living with shortness of breath. She states "I think I have to much fluid on." Patient also reports just by tightness. Onset was this morning greater than 8 hours ago. She states that it has gotten worse and the swelling on her legs has gotten worse. She was received one nitroglycerin by EMS which did not improve her pain.  Past Medical History  Diagnosis Date  . Hypertension   . HIV infection   . Chronic renal failure   . CHF (congestive heart failure)   . ESRD (end stage renal disease) on dialysis 09/30/2013    On dialysis at [facility], per [facility] physicians.  Started dialysis in 2008.  Takes HD MWF schedule.     Past Surgical History  Procedure Laterality Date  . Arteriovenous graft placement     No family history on file. History  Substance Use Topics  . Smoking status: Current Every Day Smoker -- 0.50 packs/day  . Smokeless tobacco: Not on file  . Alcohol Use: Yes   OB History   Grav Para Term Preterm Abortions TAB SAB Ect Mult Living                 Review of Systems  Constitutional: Negative for fever.  Respiratory: Positive for cough, chest tightness and shortness of breath.   Cardiovascular: Positive for chest pain and leg swelling.  Gastrointestinal: Negative for nausea, vomiting and abdominal pain.  Musculoskeletal: Negative for back pain.  Skin: Negative for wound.  Neurological: Negative for headaches.  Psychiatric/Behavioral: Negative for confusion.  All other systems reviewed  and are negative.      Allergies  Tramadol and Morphine and related  Home Medications   Current Outpatient Rx  Name  Route  Sig  Dispense  Refill  . amitriptyline (ELAVIL) 50 MG tablet   Oral   Take 50 mg by mouth at bedtime.         Marland Kitchen amLODipine (NORVASC) 5 MG tablet   Oral   Take 1 tablet (5 mg total) by mouth at bedtime.   30 tablet   0   . aspirin EC 81 MG EC tablet   Oral   Take 1 tablet (81 mg total) by mouth daily.   30 tablet   0   . carvedilol (COREG) 3.125 MG tablet   Oral   Take 1 tablet (3.125 mg total) by mouth 2 (two) times daily with a meal.   60 tablet   0   . cinacalcet (SENSIPAR) 60 MG tablet   Oral   Take 60 mg by mouth daily.         . diclofenac sodium (VOLTAREN) 1 % GEL   Topical   Apply 4 g topically 4 (four) times daily.         . diphenhydrAMINE (BENADRYL) 50 MG capsule   Oral   Take 1 capsule (50 mg total) by mouth at bedtime.   30 capsule   0   . FLUoxetine (PROZAC) 20 MG capsule   Oral   Take  1 capsule (20 mg total) by mouth daily.   30 capsule   0   . HYDROcodone-acetaminophen (NORCO/VICODIN) 5-325 MG per tablet   Oral   Take 1 tablet by mouth every 4 (four) hours as needed for moderate pain.         . hydrOXYzine (ATARAX/VISTARIL) 25 MG tablet   Oral   Take 1 tablet (25 mg total) by mouth 3 (three) times daily.   45 tablet   0   . lamiVUDine (EPIVIR) 10 MG/ML solution   Oral   Take 5 mLs (50 mg total) by mouth daily.   240 mL   0   . LORazepam (ATIVAN) 0.5 MG tablet   Oral   Take 1 tablet (0.5 mg total) by mouth every 6 (six) hours as needed (agitation).   120 tablet   5   . multivitamin (RENA-VIT) TABS tablet   Oral   Take 1 tablet by mouth at bedtime.   30 tablet   0   . omeprazole (PRILOSEC) 20 MG capsule   Oral   Take 20 mg by mouth daily.         . polyethylene glycol (MIRALAX / GLYCOLAX) packet   Oral   Take 17 g by mouth daily.         . Protein POWD   Oral   Take 1 scoop by  mouth 3 (three) times daily. Mixed with applesauce         . raltegravir (ISENTRESS) 400 MG tablet   Oral   Take 400 mg by mouth 2 (two) times daily.          Marland Kitchen. senna-docusate (SENOKOT-S) 8.6-50 MG per tablet   Oral   Take 2 tablets by mouth daily.         . sevelamer carbonate (RENVELA) 800 MG tablet   Oral   Take 1,600-2,400 mg by mouth 5 (five) times daily. 3 Tabs with meals and 2 tabs with snacks         . tenofovir (VIREAD) 300 MG tablet   Oral   Take 300 mg by mouth every Friday. Takes on Fridays.          BP 159/109  Pulse 100  Temp(Src) 97.9 F (36.6 C) (Oral)  Resp 20  SpO2 96% Physical Exam  Nursing note and vitals reviewed. Constitutional: She is oriented to person, place, and time. No distress.  obese  HENT:  Head: Normocephalic and atraumatic.  Eyes: Pupils are equal, round, and reactive to light.  Neck: Neck supple.  Cardiovascular: Normal rate, regular rhythm and normal heart sounds.   Pulmonary/Chest: Effort normal. No respiratory distress. She has no wheezes.  Crackles in the bases  Abdominal: Soft. Bowel sounds are normal.  Musculoskeletal: She exhibits edema.  2+ bilateral lower extremity edema  Neurological: She is alert and oriented to person, place, and time.  Skin: Skin is warm and dry.  Psychiatric: She has a normal mood and affect.    ED Course  Procedures (including critical care time) Labs Review Labs Reviewed  CBC WITH DIFFERENTIAL - Abnormal; Notable for the following:    RBC 3.08 (*)    Hemoglobin 11.0 (*)    HCT 32.1 (*)    MCV 104.2 (*)    MCH 35.7 (*)    All other components within normal limits  BASIC METABOLIC PANEL - Abnormal; Notable for the following:    Sodium 130 (*)    Potassium 7.0 (*)    Chloride 89 (*)  BUN 54 (*)    Creatinine, Ser 9.52 (*)    GFR calc non Af Amer 4 (*)    GFR calc Af Amer 5 (*)    All other components within normal limits  TROPONIN I   Imaging Review Dg Chest Portable 1  View  01/06/2014   CLINICAL DATA:  Short of breath, chest pain  EXAM: PORTABLE CHEST - 1 VIEW  COMPARISON:  Prior chest x-ray 11/08/2013  FINDINGS: Diffuse bilateral interstitial and airspace opacities most consistent with interstitial pulmonary edema. Linear atelectasis in the left mid lung. Stable cardiac and mediastinal contours. No definite effusion. No pneumothorax. No focal airspace consolidation. No acute osseous abnormality.  IMPRESSION: Moderate interstitial pulmonary edema.  Left mid lung subsegmental atelectasis.   Electronically Signed   By: Malachy Moan M.D.   On: 01/06/2014 02:48     EKG Interpretation None     EKG independently reviewed by myself:  Normal sinus rhythm with a rate of 106, slightly prolonged QRS when compared to prior EKGs with a QRS of 125, morphologically similar to prior EKGs  MDM   Final diagnoses:  Hyperkalemia  SOB (shortness of breath)    Patient presents with shortness of breath. Feels that she is volume overloaded. Also endorses chest pain that started this morning greater than 8 hours ago. She is nontoxic on exam. She is on room air.  She does have crackles on lung exam. Chest x-ray shows moderate interstitial pulmonary edema. EKG shows slightly prolonged QRS at 125 but otherwise morphologically similar to prior EKGs. Troponin x1 is negative (8hr troponin).  Potassium noted to be 7 on CMP. Otherwise CMP is at patient's baseline. Discuss with nephrology. Patient is scheduled for dialysis at 6 AM. Will give Kayexalate, insulin and glucose to temporize until dialysis. Patient is refusing IV and states "I just need dialysis."  She is awake, alert, and oriented. She is scheduled for dialysis one hour from now. I believe dialysis will help her hyperkalemia and her shortness of breath. She has an 8 hour troponin which is negative and feel her chest pain is likely related to volume overload as well.  Patient will be discharged back to [facility]. Have discussed  plan with [facility] living nurse and they state they can transport patient to her dialysis session at 6 AM.     [name], MD 01/06/14 [id number]

## 2014-01-06 NOTE — ED Notes (Signed)
Pt. transported back to nursing home by [facility] in stable condition .

## 2014-01-06 NOTE — ED Notes (Addendum)
Pt. arrived with EMS from [facility] nursing home reports progressing SOB with dry cough and chest tightness onset today , pt. refused IV from EMS and nurse at arrival . Received 1 NTG sl by EMS . Hemodialysis q Tues/Thurs/Sat .

## 2014-01-06 NOTE — Discharge Instructions (Signed)
Hyperkalemia Hyperkalemia means you have too much potassium in your blood. Potassium is a type of salt in the blood (electrolyte). Normally, your kidneys remove potassium from the body. Too much potassium can be life-threatening. HOME CARE  Only take medicine as told by your doctor.  Do not take vitamins or natural products unless your doctor says they are okay.  Keep all doctor visits as told.  Follow diet instructions as told by your doctor. GET HELP RIGHT AWAY IF:  Your heartbeat is not regular or very slow.  You feel dizzy (lightheaded).  You feel weak.  You are short of breath.  You have chest pain.  You pass out (faint). MAKE SURE YOU:   Understand these instructions.  Will watch your condition.  Will get help right away if you are not doing well or get worse. Document Released: 09/25/2005 Document Revised: 12/18/2011 Document Reviewed: 03/15/2011 [facility] Patient Information 2014 [place], [place]. Pulmonary Edema Pulmonary edema (PE) is a condition in which fluid collects in the lungs. This makes it hard to breathe. PE may be a result of the heart not pumping very well or a result of injury.  CAUSES   Coronary artery disease causes blockages in the arteries of the heart. This deprives the heart muscle of oxygen and weakens the muscle. A heart attack is a form of coronary artery disease.  High blood pressure causes the heart muscle to work harder than usual. Over time, the heart muscle may get stiff, and it starts to work less efficiently. It may also fatigue and weaken.  Viral infection of the heart (myocarditis) may weaken the heart muscle.  Metabolic conditions such as thyroid disease, excessive alcohol use, certain vitamin deficiencies, or diabetes may also weaken the heart muscle.  Leaky or stiff heart valves may impair normal heart function.  Lung disease may strain the heart muscle.  Excessive demands on the heart such as too much salt or fluid  intake.  Failure to take prescribed medicines.  Lung injury from heat or toxins, such as poisonous gas.  Infection in the lungs or other parts of the body.  Fluid overload caused by kidney failure or medicines. SYMPTOMS   Shortness of breath at rest or with exertion.  Grunting, wheezing, or gurgling while breathing.  Feeling like you cannot get enough air.  Breaths are shallow and fast.  A lot of coughing with frothy or bloody mucus.  Skin may become cool, damp, and turn a pale or bluish color. DIAGNOSIS  Initial diagnosis may be based on your history, symptoms, and a physical examination. Additional tests for PE may include:  Electrocardiography.  Chest X-ray.  Blood tests.  Stress test.  Ultrasound evaluation of the heart (echocardiography).  Evaluation by a heart doctor (cardiologist).  Test of the heart arteries to look for blockages (angiography).  Check of blood oxygen. TREATMENT  Treatment of PE will depend on the underlying cause and will focus first on relieving the symptoms.   Extra oxygen to make breathing easier and assist with removing mucus. This may include breathing treatments or a tube into the lungs and a breathing machine.  Medicine to help the body get rid of extra water, usually through an IV tube.  Medicine to help the heart pump better.  If poor heart function is the cause, treatment may include:  Procedures to open blocked arteries, repair damaged heart valves, or remove some of the damaged heart muscle.  A pacemaker to help the heart pump with less effort. HOME  CARE INSTRUCTIONS   Your health care provider will help you determine what type of exercise program may be helpful. It is important to maintain strength and increase it if possible. Pace your activities to avoid shortness of breath or chest pain. Rest for at least 1 hour before and after meals. Cardiac rehabilitation programs are available in some locations.  Eat a heart  healthy diet low in salt, saturated fat, and cholesterol. Ask for help with choices.  Make a list of every medicine, vitamin, or herbal supplement you are taking. Keep the list with you at all times. Show it to your health care provider at every visit and before starting a new medicine. Keep the list up to date.  Ask your health care provider or pharmacist to help you write a plan or schedule so that you know things about each medicine such as:  Why you are taking it.  The possible side effects.  The best time of day to take it.  Foods to take with it or avoid.  When to stop taking it.  Record your hospital or clinic weight. When you get home, compare it to your scale and record your weight. Then, weigh yourself first thing in the morning daily, and record the weights. You should weigh yourself every morning after you urinate and before you eat breakfast. Wear the same amount of clothing each time you weigh yourself. Provide your health care provider with your weight record. Daily weights are important in the early recognition of excess fluid. Tell your health care provider right away if you have gained 03 lb/1.4 kg in 1 day, 05 lb/2.3 kg in a week or as directed by your health care provider. Your medicines may need to be adjusted.  Blood pressure monitoring should be done as often as directed. You can get a home blood pressure cuff at your drugstore. Record these values and bring them with you for your clinic visits. Notify your health care provider if you become dizzy or lightheaded when standing up.  If you are currently a smoker, it is time to quit. Nicotine makes your heart work harder and is one of the leading causes of cardiac deaths. Do not use nicotine gum or patches before talking to your doctor.  Make a follow-up appointment with your health care provider as directed.  Ask your health care provider for a copy of your latest heart tracing (ECG) and keep a copy with you at all  times. SEEK IMMEDIATE MEDICAL CARE IF:   You have severe chest pain, especially if the pain is crushing or pressure-like and spreads to the arms, back, neck, or jaw. THIS IS AN EMERGENCY. Do not wait to see if the pain will go away. Call for local emergency medical help. Do not drive yourself to the hospital.  You have sweating, feel sick to your stomach (nauseous), or are experiencing shortness of breath.  Your weight increases by 03 lb/1.4 kg in 1 day or 05 lb/2.3 kg in a week.  You notice increasing shortness of breath that is unusual for you. This may happen during rest, sleep, or with activity.  You develop chest pain (angina) or pain that is unusual for you.  You notice more swelling in your hands, feet, ankles or abdomen.  You notice lasting (persistent) dizziness, blurred vision, headache, or unsteadiness.  You begin to cough up bloody mucus (sputum).  You are unable to sleep because it is hard to breathe.  You begin to feel a "jumping"  or "fluttering" sensation (palpitations) in the chest that is unusual for you. MAKE SURE YOU:  Understand these instructions.  Will watch your condition  Will get help right away if you are not doing well or get worse. Document Released: 12/16/2002 Document Revised: 07/16/2013 Document Reviewed: 06/02/2013 [facility] Patient Information 2014 [place], [place].  You were found to have a high potassium and SOB.  You need dialysis.  It is very important that you report to dialysis today as scheduled.

## 2014-01-06 NOTE — ED Notes (Signed)
Pt. strongly refused IV start and IV medications , EDP notified .

## 2014-01-16 ENCOUNTER — Inpatient Hospital Stay (HOSPITAL_COMMUNITY)
Admission: EM | Admit: 2014-01-16 | Discharge: 2014-01-21 | DRG: 391 | Disposition: A | Discharge status: Skilled Nursing Facility | Payer: Medicare Other | Attending: Internal Medicine | Admitting: Internal Medicine

## 2014-01-16 ENCOUNTER — Encounter (HOSPITAL_COMMUNITY): Payer: Self-pay | Admitting: Emergency Medicine

## 2014-01-16 DIAGNOSIS — D631 Anemia in chronic kidney disease: Secondary | ICD-10-CM

## 2014-01-16 DIAGNOSIS — K2970 Gastritis, unspecified, without bleeding: Principal | ICD-10-CM | POA: Diagnosis present

## 2014-01-16 DIAGNOSIS — Z6841 Body Mass Index (BMI) 40.0 and over, adult: Secondary | ICD-10-CM

## 2014-01-16 DIAGNOSIS — F411 Generalized anxiety disorder: Secondary | ICD-10-CM

## 2014-01-16 DIAGNOSIS — D638 Anemia in other chronic diseases classified elsewhere: Secondary | ICD-10-CM | POA: Diagnosis present

## 2014-01-16 DIAGNOSIS — F1410 Cocaine abuse, uncomplicated: Secondary | ICD-10-CM | POA: Diagnosis present

## 2014-01-16 DIAGNOSIS — Z9119 Patient's noncompliance with other medical treatment and regimen: Secondary | ICD-10-CM

## 2014-01-16 DIAGNOSIS — F1490 Cocaine use, unspecified, uncomplicated: Secondary | ICD-10-CM

## 2014-01-16 DIAGNOSIS — R109 Unspecified abdominal pain: Secondary | ICD-10-CM | POA: Diagnosis present

## 2014-01-16 DIAGNOSIS — G9349 Other encephalopathy: Secondary | ICD-10-CM | POA: Diagnosis not present

## 2014-01-16 DIAGNOSIS — R112 Nausea with vomiting, unspecified: Secondary | ICD-10-CM

## 2014-01-16 DIAGNOSIS — N189 Chronic kidney disease, unspecified: Secondary | ICD-10-CM

## 2014-01-16 DIAGNOSIS — M1710 Unilateral primary osteoarthritis, unspecified knee: Secondary | ICD-10-CM

## 2014-01-16 DIAGNOSIS — F1210 Cannabis abuse, uncomplicated: Secondary | ICD-10-CM | POA: Diagnosis present

## 2014-01-16 DIAGNOSIS — F17200 Nicotine dependence, unspecified, uncomplicated: Secondary | ICD-10-CM | POA: Diagnosis present

## 2014-01-16 DIAGNOSIS — Z7982 Long term (current) use of aspirin: Secondary | ICD-10-CM

## 2014-01-16 DIAGNOSIS — R32 Unspecified urinary incontinence: Secondary | ICD-10-CM

## 2014-01-16 DIAGNOSIS — I10 Essential (primary) hypertension: Secondary | ICD-10-CM | POA: Diagnosis present

## 2014-01-16 DIAGNOSIS — Z5900 Homelessness unspecified: Secondary | ICD-10-CM

## 2014-01-16 DIAGNOSIS — W19XXXA Unspecified fall, initial encounter: Secondary | ICD-10-CM

## 2014-01-16 DIAGNOSIS — E875 Hyperkalemia: Secondary | ICD-10-CM | POA: Diagnosis present

## 2014-01-16 DIAGNOSIS — I471 Supraventricular tachycardia: Secondary | ICD-10-CM

## 2014-01-16 DIAGNOSIS — K2990 Gastroduodenitis, unspecified, without bleeding: Principal | ICD-10-CM

## 2014-01-16 DIAGNOSIS — F1911 Other psychoactive substance abuse, in remission: Secondary | ICD-10-CM

## 2014-01-16 DIAGNOSIS — J810 Acute pulmonary edema: Secondary | ICD-10-CM

## 2014-01-16 DIAGNOSIS — E668 Other obesity: Secondary | ICD-10-CM

## 2014-01-16 DIAGNOSIS — B20 Human immunodeficiency virus [HIV] disease: Secondary | ICD-10-CM | POA: Diagnosis present

## 2014-01-16 DIAGNOSIS — N2581 Secondary hyperparathyroidism of renal origin: Secondary | ICD-10-CM

## 2014-01-16 DIAGNOSIS — Z91199 Patient's noncompliance with other medical treatment and regimen due to unspecified reason: Secondary | ICD-10-CM

## 2014-01-16 DIAGNOSIS — R42 Dizziness and giddiness: Secondary | ICD-10-CM | POA: Diagnosis not present

## 2014-01-16 DIAGNOSIS — R197 Diarrhea, unspecified: Secondary | ICD-10-CM

## 2014-01-16 DIAGNOSIS — Z992 Dependence on renal dialysis: Secondary | ICD-10-CM

## 2014-01-16 DIAGNOSIS — M1611 Unilateral primary osteoarthritis, right hip: Secondary | ICD-10-CM

## 2014-01-16 DIAGNOSIS — I120 Hypertensive chronic kidney disease with stage 5 chronic kidney disease or end stage renal disease: Secondary | ICD-10-CM | POA: Diagnosis present

## 2014-01-16 DIAGNOSIS — F1920 Other psychoactive substance dependence, uncomplicated: Secondary | ICD-10-CM

## 2014-01-16 DIAGNOSIS — K5900 Constipation, unspecified: Secondary | ICD-10-CM

## 2014-01-16 DIAGNOSIS — M179 Osteoarthritis of knee, unspecified: Secondary | ICD-10-CM

## 2014-01-16 DIAGNOSIS — R079 Chest pain, unspecified: Secondary | ICD-10-CM

## 2014-01-16 DIAGNOSIS — Y92009 Unspecified place in unspecified non-institutional (private) residence as the place of occurrence of the external cause: Secondary | ICD-10-CM

## 2014-01-16 DIAGNOSIS — K8020 Calculus of gallbladder without cholecystitis without obstruction: Secondary | ICD-10-CM

## 2014-01-16 DIAGNOSIS — I509 Heart failure, unspecified: Secondary | ICD-10-CM | POA: Diagnosis present

## 2014-01-16 DIAGNOSIS — N186 End stage renal disease: Secondary | ICD-10-CM | POA: Diagnosis present

## 2014-01-16 HISTORY — DX: Anxiety disorder, unspecified: F41.9

## 2014-01-16 HISTORY — DX: Angina pectoris, unspecified: I20.9

## 2014-01-16 HISTORY — DX: Gastro-esophageal reflux disease without esophagitis: K21.9

## 2014-01-16 HISTORY — DX: Major depressive disorder, single episode, unspecified: F32.9

## 2014-01-16 HISTORY — DX: Shortness of breath: R06.02

## 2014-01-16 HISTORY — DX: Anemia, unspecified: D64.9

## 2014-01-16 HISTORY — DX: Pneumonia, unspecified organism: J18.9

## 2014-01-16 HISTORY — DX: Morbid (severe) obesity due to excess calories: E66.01

## 2014-01-16 HISTORY — DX: Unspecified osteoarthritis, unspecified site: M19.90

## 2014-01-16 HISTORY — DX: Depression, unspecified: F32.A

## 2014-01-16 MED ORDER — ONDANSETRON HCL 4 MG/2ML IJ SOLN
4.0000 mg | Freq: Once | INTRAMUSCULAR | Status: AC
  Administered 2014-01-17: 4 mg via INTRAVENOUS
  Filled 2014-01-16: qty 2

## 2014-01-16 NOTE — ED Notes (Signed)
Abd. Pain x 2-3 days. Tender and dull pain. Mostly epigastric. Only nausea, dry heaves. Hx of similar symptoms and dx. Of gerd. Also, having diarrhea. No fever. Been smoking much crack/coaine today. 12 ECG unremarkable. Dialysis (t, th, sat.)

## 2014-01-17 ENCOUNTER — Encounter (HOSPITAL_COMMUNITY): Payer: Self-pay | Admitting: Nephrology

## 2014-01-17 ENCOUNTER — Emergency Department (HOSPITAL_COMMUNITY): Payer: Medicare Other

## 2014-01-17 DIAGNOSIS — R109 Unspecified abdominal pain: Secondary | ICD-10-CM | POA: Diagnosis present

## 2014-01-17 DIAGNOSIS — E875 Hyperkalemia: Secondary | ICD-10-CM

## 2014-01-17 DIAGNOSIS — F1410 Cocaine abuse, uncomplicated: Secondary | ICD-10-CM

## 2014-01-17 DIAGNOSIS — F1490 Cocaine use, unspecified, uncomplicated: Secondary | ICD-10-CM | POA: Diagnosis present

## 2014-01-17 DIAGNOSIS — N186 End stage renal disease: Secondary | ICD-10-CM

## 2014-01-17 LAB — I-STAT CHEM 8, ED
BUN: 39 mg/dL — ABNORMAL HIGH (ref 6–23)
Calcium, Ion: 0.93 mmol/L — ABNORMAL LOW (ref 1.13–1.30)
Chloride: 98 mEq/L (ref 96–112)
Creatinine, Ser: 9.4 mg/dL — ABNORMAL HIGH (ref 0.50–1.10)
Glucose, Bld: 119 mg/dL — ABNORMAL HIGH (ref 70–99)
HCT: 37 % (ref 36.0–46.0)
HEMOGLOBIN: 12.6 g/dL (ref 12.0–15.0)
Potassium: 6.6 mEq/L (ref 3.7–5.3)
Sodium: 131 mEq/L — ABNORMAL LOW (ref 137–147)
TCO2: 27 mmol/L (ref 0–100)

## 2014-01-17 LAB — CBC WITH DIFFERENTIAL/PLATELET
BASOS ABS: 0 10*3/uL (ref 0.0–0.1)
Basophils Relative: 0 % (ref 0–1)
EOS PCT: 0 % (ref 0–5)
Eosinophils Absolute: 0 10*3/uL (ref 0.0–0.7)
HEMATOCRIT: 36.7 % (ref 36.0–46.0)
Hemoglobin: 12.6 g/dL (ref 12.0–15.0)
Lymphocytes Relative: 32 % (ref 12–46)
Lymphs Abs: 4.3 10*3/uL — ABNORMAL HIGH (ref 0.7–4.0)
MCH: 36.1 pg — ABNORMAL HIGH (ref 26.0–34.0)
MCHC: 34.3 g/dL (ref 30.0–36.0)
MCV: 105.2 fL — AB (ref 78.0–100.0)
MONO ABS: 1.3 10*3/uL — AB (ref 0.1–1.0)
Monocytes Relative: 10 % (ref 3–12)
Neutro Abs: 7.7 10*3/uL (ref 1.7–7.7)
Neutrophils Relative %: 58 % (ref 43–77)
Platelets: 398 10*3/uL (ref 150–400)
RBC: 3.49 MIL/uL — ABNORMAL LOW (ref 3.87–5.11)
RDW: 13.9 % (ref 11.5–15.5)
WBC: 13.3 10*3/uL — AB (ref 4.0–10.5)

## 2014-01-17 LAB — MRSA PCR SCREENING: MRSA by PCR: NEGATIVE

## 2014-01-17 LAB — COMPREHENSIVE METABOLIC PANEL
ALT: 12 U/L (ref 0–35)
AST: 19 U/L (ref 0–37)
Albumin: 4.5 g/dL (ref 3.5–5.2)
Alkaline Phosphatase: 252 U/L — ABNORMAL HIGH (ref 39–117)
BUN: 40 mg/dL — ABNORMAL HIGH (ref 6–23)
CALCIUM: 9.5 mg/dL (ref 8.4–10.5)
CO2: 24 mEq/L (ref 19–32)
CREATININE: 8.37 mg/dL — AB (ref 0.50–1.10)
Chloride: 89 mEq/L — ABNORMAL LOW (ref 96–112)
GFR calc Af Amer: 5 mL/min — ABNORMAL LOW (ref >90)
GFR calc non Af Amer: 5 mL/min — ABNORMAL LOW (ref >90)
GLUCOSE: 123 mg/dL — AB (ref 70–99)
Potassium: 6.7 mEq/L (ref 3.7–5.3)
Sodium: 135 mEq/L — ABNORMAL LOW (ref 137–147)
Total Bilirubin: 0.4 mg/dL (ref 0.3–1.2)
Total Protein: 8.7 g/dL — ABNORMAL HIGH (ref 6.0–8.3)

## 2014-01-17 LAB — I-STAT TROPONIN, ED: TROPONIN I, POC: 0.05 ng/mL (ref 0.00–0.08)

## 2014-01-17 LAB — LIPASE, BLOOD: LIPASE: 36 U/L (ref 11–59)

## 2014-01-17 MED ORDER — TENOFOVIR DISOPROXIL FUMARATE 300 MG PO TABS: 300.0000 mg | ORAL_TABLET | ORAL | Status: DC

## 2014-01-17 MED ORDER — RALTEGRAVIR POTASSIUM 400 MG PO TABS
400.0000 mg | ORAL_TABLET | Freq: Two times a day (BID) | ORAL | Status: DC
  Administered 2014-01-17 – 2014-01-21 (×9): 400 mg via ORAL
  Filled 2014-01-17 (×10): qty 1

## 2014-01-17 MED ORDER — PENTAFLUOROPROP-TETRAFLUOROETH EX AERO: 1.0000 "application " | INHALATION_SPRAY | CUTANEOUS | Status: DC | PRN

## 2014-01-17 MED ORDER — SEVELAMER CARBONATE 800 MG PO TABS
2400.0000 mg | ORAL_TABLET | Freq: Three times a day (TID) | ORAL | Status: DC
  Administered 2014-01-17 – 2014-01-19 (×6): 2400 mg via ORAL
  Filled 2014-01-17 (×9): qty 3

## 2014-01-17 MED ORDER — IOHEXOL 300 MG/ML  SOLN
25.0000 mL | Freq: Once | INTRAMUSCULAR | Status: AC | PRN
  Administered 2014-01-17: 25 mL via ORAL

## 2014-01-17 MED ORDER — DIPHENHYDRAMINE HCL 50 MG PO CAPS
50.0000 mg | ORAL_CAPSULE | Freq: Every day | ORAL | Status: DC
  Administered 2014-01-17 – 2014-01-20 (×4): 50 mg via ORAL
  Filled 2014-01-17 (×5): qty 1

## 2014-01-17 MED ORDER — DEXTROSE 50 % IV SOLN
1.0000 | Freq: Once | INTRAVENOUS | Status: AC
  Administered 2014-01-17: 50 mL via INTRAVENOUS
  Filled 2014-01-17: qty 50

## 2014-01-17 MED ORDER — MORPHINE SULFATE 2 MG/ML IJ SOLN
2.0000 mg | INTRAMUSCULAR | Status: DC | PRN
  Filled 2014-01-17: qty 1

## 2014-01-17 MED ORDER — INSULIN ASPART 100 UNIT/ML IV SOLN
10.0000 [IU] | Freq: Once | INTRAVENOUS | Status: AC
  Administered 2014-01-17: 10 [IU] via INTRAVENOUS
  Filled 2014-01-17: qty 0.1

## 2014-01-17 MED ORDER — HEPARIN SODIUM (PORCINE) 5000 UNIT/ML IJ SOLN
5000.0000 [IU] | Freq: Three times a day (TID) | INTRAMUSCULAR | Status: DC
  Administered 2014-01-17 – 2014-01-19 (×5): 5000 [IU] via SUBCUTANEOUS
  Filled 2014-01-17 (×14): qty 1

## 2014-01-17 MED ORDER — DEXTROSE 50 % IV SOLN: 50.0000 mL | Freq: Once | INTRAVENOUS | Status: DC

## 2014-01-17 MED ORDER — FLUOXETINE HCL 20 MG PO CAPS
20.0000 mg | ORAL_CAPSULE | Freq: Every day | ORAL | Status: DC
  Administered 2014-01-17 – 2014-01-21 (×5): 20 mg via ORAL
  Filled 2014-01-17 (×5): qty 1

## 2014-01-17 MED ORDER — FENTANYL CITRATE 0.05 MG/ML IJ SOLN
25.0000 ug | Freq: Four times a day (QID) | INTRAMUSCULAR | Status: DC | PRN
  Administered 2014-01-17 – 2014-01-18 (×3): 25 ug via INTRAVENOUS
  Filled 2014-01-17 (×3): qty 2

## 2014-01-17 MED ORDER — HEPARIN SODIUM (PORCINE) 1000 UNIT/ML DIALYSIS
3200.0000 [IU] | Freq: Once | INTRAMUSCULAR | Status: DC
  Filled 2014-01-17: qty 4

## 2014-01-17 MED ORDER — PANTOPRAZOLE SODIUM 40 MG PO TBEC
40.0000 mg | DELAYED_RELEASE_TABLET | Freq: Every day | ORAL | Status: DC
  Administered 2014-01-17 – 2014-01-21 (×5): 40 mg via ORAL
  Filled 2014-01-17 (×5): qty 1

## 2014-01-17 MED ORDER — CARVEDILOL 3.125 MG PO TABS
3.1250 mg | ORAL_TABLET | Freq: Two times a day (BID) | ORAL | Status: DC
  Administered 2014-01-17 – 2014-01-21 (×8): 3.125 mg via ORAL
  Filled 2014-01-17 (×11): qty 1

## 2014-01-17 MED ORDER — LIDOCAINE HCL (PF) 1 % IJ SOLN: 5.0000 mL | INTRAMUSCULAR | Status: DC | PRN

## 2014-01-17 MED ORDER — HEPARIN SODIUM (PORCINE) 1000 UNIT/ML DIALYSIS
1000.0000 [IU] | INTRAMUSCULAR | Status: DC | PRN
  Filled 2014-01-17: qty 1

## 2014-01-17 MED ORDER — ALBUTEROL SULFATE (2.5 MG/3ML) 0.083% IN NEBU
10.0000 mg | INHALATION_SOLUTION | Freq: Once | RESPIRATORY_TRACT | Status: AC
  Administered 2014-01-17: 10 mg via RESPIRATORY_TRACT
  Filled 2014-01-17: qty 12

## 2014-01-17 MED ORDER — GI COCKTAIL ~~LOC~~
30.0000 mL | Freq: Once | ORAL | Status: AC
  Administered 2014-01-17: 30 mL via ORAL
  Filled 2014-01-17: qty 30

## 2014-01-17 MED ORDER — LORAZEPAM 0.5 MG PO TABS: 0.5000 mg | ORAL_TABLET | Freq: Four times a day (QID) | ORAL | Status: DC | PRN

## 2014-01-17 MED ORDER — SODIUM CHLORIDE 0.9 % IV SOLN: 100.0000 mL | INTRAVENOUS | Status: DC | PRN

## 2014-01-17 MED ORDER — SODIUM CHLORIDE 0.9 % IV SOLN
1.0000 g | Freq: Once | INTRAVENOUS | Status: AC
  Administered 2014-01-17: 1 g via INTRAVENOUS
  Filled 2014-01-17: qty 10

## 2014-01-17 MED ORDER — NEPRO/CARBSTEADY PO LIQD: 237.0000 mL | ORAL | Status: DC | PRN

## 2014-01-17 MED ORDER — FENTANYL CITRATE 0.05 MG/ML IJ SOLN
50.0000 ug | Freq: Once | INTRAMUSCULAR | Status: AC
  Administered 2014-01-17: 01:00:00 via INTRAVENOUS
  Filled 2014-01-17: qty 2

## 2014-01-17 MED ORDER — IOHEXOL 300 MG/ML  SOLN
100.0000 mL | Freq: Once | INTRAMUSCULAR | Status: AC | PRN
  Administered 2014-01-17: 100 mL via INTRAVENOUS

## 2014-01-17 MED ORDER — LIDOCAINE-PRILOCAINE 2.5-2.5 % EX CREA: 1.0000 "application " | TOPICAL_CREAM | CUTANEOUS | Status: DC | PRN

## 2014-01-17 MED ORDER — DICLOFENAC SODIUM 1 % TD GEL
4.0000 g | Freq: Four times a day (QID) | TRANSDERMAL | Status: DC
  Administered 2014-01-17 – 2014-01-18 (×6): 4 g via TOPICAL
  Filled 2014-01-17: qty 100

## 2014-01-17 MED ORDER — ALTEPLASE 2 MG IJ SOLR
2.0000 mg | Freq: Once | INTRAMUSCULAR | Status: AC | PRN
  Filled 2014-01-17: qty 2

## 2014-01-17 MED ORDER — AMITRIPTYLINE HCL 50 MG PO TABS
50.0000 mg | ORAL_TABLET | Freq: Every day | ORAL | Status: DC
  Administered 2014-01-17 – 2014-01-20 (×4): 50 mg via ORAL
  Filled 2014-01-17 (×5): qty 1

## 2014-01-17 MED ORDER — FENTANYL CITRATE 0.05 MG/ML IJ SOLN
50.0000 ug | Freq: Once | INTRAMUSCULAR | Status: AC
  Administered 2014-01-17: 50 ug via INTRAVENOUS
  Filled 2014-01-17: qty 2

## 2014-01-17 MED ORDER — AMLODIPINE BESYLATE 5 MG PO TABS
5.0000 mg | ORAL_TABLET | Freq: Every day | ORAL | Status: DC
  Administered 2014-01-17 – 2014-01-20 (×4): 5 mg via ORAL
  Filled 2014-01-17 (×5): qty 1

## 2014-01-17 MED ORDER — GI COCKTAIL ~~LOC~~
30.0000 mL | Freq: Three times a day (TID) | ORAL | Status: DC | PRN
  Filled 2014-01-17: qty 30

## 2014-01-17 MED ORDER — SEVELAMER CARBONATE 800 MG PO TABS
1600.0000 mg | ORAL_TABLET | Freq: Two times a day (BID) | ORAL | Status: DC | PRN
  Filled 2014-01-17: qty 2

## 2014-01-17 MED ORDER — FENTANYL CITRATE 0.05 MG/ML IJ SOLN: 50.0000 ug | Freq: Once | INTRAMUSCULAR | Status: DC

## 2014-01-17 MED ORDER — SEVELAMER CARBONATE 800 MG PO TABS: 1600.0000 mg | ORAL_TABLET | Freq: Every day | ORAL | Status: DC

## 2014-01-17 MED ORDER — LAMIVUDINE 10 MG/ML PO SOLN
50.0000 mg | Freq: Every day | ORAL | Status: DC
  Administered 2014-01-17 – 2014-01-21 (×5): 50 mg via ORAL
  Filled 2014-01-17 (×5): qty 5

## 2014-01-17 NOTE — Progress Notes (Signed)
PT Cancellation Note  Patient Details Name: [name] MRN: [id number] DOB: Mar 15, 1951   Cancelled Treatment:    Reason Eval/Treat Not Completed: Medical issues which prohibited therapy (order received. Pt currently with hyperkalemia and await normalization of values prior to evaluation)   Conard Alvira B Lashaun Krapf 01/17/2014, 9:37 AM Delaney MeigsMaija Tabor Deshawna Mcneece, PT [phone]

## 2014-01-17 NOTE — ED Provider Notes (Signed)
CSN: [id number]     Arrival date & time 01/16/14  2335 History   First MD Initiated Contact with Patient 01/17/14 0006     Chief Complaint  Patient presents with  . Abdominal Pain     (Consider location/radiation/quality/duration/timing/severity/associated sxs/prior Treatment) Patient is a 63 y.o. female presenting with abdominal pain.  Abdominal Pain  63 yo female presents to the ER via EMS with complaint of upper abdominal pain, nausea and vomiting.  She also reports some loose stools.  Sxs ongoing for the last 2-3 days.  Pt reports yesterday she left her nursing home, [facility], and went to live with family in [place].  She reports she had her dialysis prior to leaving.  Once in [place], she "got in with a bad crowd" and has been abusing crack cocaine for the last 24 hours.  Pt had family bring her back to [place].  No chest pain.  No fevers.  Pt only makes small amount of urine. Past Medical History  Diagnosis Date  . Hypertension   . HIV infection   . Chronic renal failure   . CHF (congestive heart failure)   . ESRD (end stage renal disease) on dialysis 09/30/2013    [facility], [facility].  Started dialysis in 2008.  Takes HD MWF schedule.     Past Surgical History  Procedure Laterality Date  . Arteriovenous graft placement     History reviewed. No pertinent family history. History  Substance Use Topics  . Smoking status: Current Every Day Smoker -- 0.50 packs/day  . Smokeless tobacco: Not on file  . Alcohol Use: Yes   OB History   Grav Para Term Preterm Abortions TAB SAB Ect Mult Living                 Review of Systems  Gastrointestinal: Positive for abdominal pain.  All other systems reviewed and are negative.  Other than listed in hpi   Allergies  Tramadol and Morphine and related  Home Medications   Current Outpatient Rx  Name  Route  Sig  Dispense  Refill  . amitriptyline (ELAVIL) 50 MG tablet   Oral   Take 50 mg  by mouth at bedtime.         Marland Kitchen. amLODipine (NORVASC) 5 MG tablet   Oral   Take 1 tablet (5 mg total) by mouth at bedtime.   30 tablet   0   . aspirin EC 81 MG EC tablet   Oral   Take 1 tablet (81 mg total) by mouth daily.   30 tablet   0   . carvedilol (COREG) 3.125 MG tablet   Oral   Take 1 tablet (3.125 mg total) by mouth 2 (two) times daily with a meal.   60 tablet   0   . cinacalcet (SENSIPAR) 60 MG tablet   Oral   Take 60 mg by mouth daily.         . diclofenac sodium (VOLTAREN) 1 % GEL   Topical   Apply 4 g topically 4 (four) times daily.         . diphenhydrAMINE (BENADRYL) 50 MG capsule   Oral   Take 1 capsule (50 mg total) by mouth at bedtime.   30 capsule   0   . FLUoxetine (PROZAC) 20 MG capsule   Oral   Take 1 capsule (20 mg total) by mouth daily.   30 capsule   0   .  HYDROcodone-acetaminophen (NORCO/VICODIN) 5-325 MG per tablet   Oral   Take 1 tablet by mouth every 4 (four) hours as needed for moderate pain.         . hydrOXYzine (ATARAX/VISTARIL) 25 MG tablet   Oral   Take 1 tablet (25 mg total) by mouth 3 (three) times daily.   45 tablet   0   . lamiVUDine (EPIVIR) 10 MG/ML solution   Oral   Take 5 mLs (50 mg total) by mouth daily.   240 mL   0   . LORazepam (ATIVAN) 0.5 MG tablet   Oral   Take 1 tablet (0.5 mg total) by mouth every 6 (six) hours as needed (agitation).   120 tablet   5   . multivitamin (RENA-VIT) TABS tablet   Oral   Take 1 tablet by mouth at bedtime.   30 tablet   0   . omeprazole (PRILOSEC) 20 MG capsule   Oral   Take 20 mg by mouth daily.         . polyethylene glycol (MIRALAX / GLYCOLAX) packet   Oral   Take 17 g by mouth daily.         . Protein POWD   Oral   Take 1 scoop by mouth 3 (three) times daily. Mixed with applesauce         . raltegravir (ISENTRESS) 400 MG tablet   Oral   Take 400 mg by mouth 2 (two) times daily.          Marland Kitchen senna-docusate (SENOKOT-S) 8.6-50 MG per tablet    Oral   Take 2 tablets by mouth daily.         . sevelamer carbonate (RENVELA) 800 MG tablet   Oral   Take 1,600-2,400 mg by mouth 5 (five) times daily. 3 Tabs with meals and 2 tabs with snacks         . tenofovir (VIREAD) 300 MG tablet   Oral   Take 300 mg by mouth every Friday. Takes on Fridays.          BP 140/78  Pulse 111  Temp(Src) 98.6 F (37 C) (Oral)  Resp 17  SpO2 97% Physical Exam  Nursing note and vitals reviewed. Constitutional: She is oriented to person, place, and time. She appears well-developed and well-nourished. She appears distressed.  Obese female, chronically ill appearing, vomiting  HENT:  Head: Normocephalic and atraumatic.  Right Ear: External ear normal.  Left Ear: External ear normal.  Nose: Nose normal.  Mouth/Throat: Oropharynx is clear and moist.  Eyes: Conjunctivae and EOM are normal. Pupils are equal, round, and reactive to light.  Neck: Normal range of motion. Neck supple. No JVD present. No tracheal deviation present. No thyromegaly present.  Cardiovascular: Normal rate, regular rhythm, normal heart sounds and intact distal pulses.  Exam reveals no gallop and no friction rub.   No murmur heard. Pulmonary/Chest: Effort normal and breath sounds normal. No stridor. No respiratory distress. She has no wheezes. She has no rales. She exhibits no tenderness.  Abdominal: Soft. Bowel sounds are normal. She exhibits no distension and no mass. There is tenderness (diffuse moderate to severe tenderness across upper abdomen). There is no rebound and no guarding.  Musculoskeletal: Normal range of motion. She exhibits no edema and no tenderness.  Thrill noted to left forearm  Lymphadenopathy:    She has no cervical adenopathy.  Neurological: She is alert and oriented to person, place, and time. She has normal reflexes. No  cranial nerve deficit. She exhibits normal muscle tone. Coordination normal.  Skin: Skin is warm and dry. No rash noted. No erythema.  No pallor.  Psychiatric: She has a normal mood and affect. Her behavior is normal. Judgment and thought content normal.    ED Course  Procedures (including critical care time)  CRITICAL CARE Performed by: Olivia Mackie Total critical care time: 45 min Critical care time was exclusive of separately billable procedures and treating other patients. Critical care was necessary to treat or prevent imminent or life-threatening deterioration. Critical care was time spent personally by me on the following activities: development of treatment plan with patient and/or surrogate as well as nursing, discussions with consultants, evaluation of patient's response to treatment, examination of patient, obtaining history from patient or surrogate, ordering and performing treatments and interventions, ordering and review of laboratory studies, ordering and review of radiographic studies, pulse oximetry and re-evaluation of patient's condition. Labs Review Labs Reviewed  CBC WITH DIFFERENTIAL - Abnormal; Notable for the following:    WBC 13.3 (*)    RBC 3.49 (*)    MCV 105.2 (*)    MCH 36.1 (*)    Lymphs Abs 4.3 (*)    Monocytes Absolute 1.3 (*)    All other components within normal limits  COMPREHENSIVE METABOLIC PANEL - Abnormal; Notable for the following:    Sodium 135 (*)    Potassium 6.7 (*)    Chloride 89 (*)    Glucose, Bld 123 (*)    BUN 40 (*)    Creatinine, Ser 8.37 (*)    Total Protein 8.7 (*)    Alkaline Phosphatase 252 (*)    GFR calc non Af Amer 5 (*)    GFR calc Af Amer 5 (*)    All other components within normal limits  I-STAT CHEM 8, ED - Abnormal; Notable for the following:    Sodium 131 (*)    Potassium 6.6 (*)    BUN 39 (*)    Creatinine, Ser 9.40 (*)    Glucose, Bld 119 (*)    Calcium, Ion 0.93 (*)    All other components within normal limits  LIPASE, BLOOD  URINE RAPID DRUG SCREEN (HOSP PERFORMED)  I-STAT TROPOININ, ED   Imaging Review Ct Abdomen Pelvis W  Contrast  01/17/2014   CLINICAL DATA:  Abdominal pain.  EXAM: CT ABDOMEN AND PELVIS WITH CONTRAST  TECHNIQUE: Multidetector CT imaging of the abdomen and pelvis was performed using the standard protocol following bolus administration of intravenous contrast.  CONTRAST:  OMNIPAQUE IOHEXOL 300 MG/ML  SOLN  COMPARISON:  None.  FINDINGS: Visualized lung bases appear normal. Diffuse sclerosis of the visualized skeleton is noted consistent with a history of end-stage renal disease. Multiple Schmorl's nodes are noted throughout the spine.  The liver, spleen and pancreas appear normal. No gallstones are noted. Gallbladder does appear to be dilated. Severe atrophy of both kidneys is noted consistent with history of end-stage renal disease. Adrenal glands appear normal. Atherosclerotic calcifications of abdominal aorta are noted without aneurysm formation. The appendix appears normal. There is no evidence of bowel obstruction. No abnormal fluid collection is noted. Small calcified fibroid arises from the uterine fundus. Urinary bladder appears normal. Small fat containing right inguinal hernia is noted. Small fat containing  periumbilical hernia is noted.  No significant adenopathy is noted.  IMPRESSION: Bilateral renal atrophy is noted consistent with history of end-stage renal disease.  Distended gallbladder is noted, but no gallstones or biliary dilatation  is noted.  Small calcified uterine fibroid.  Small fat containing periumbilical hernia.  Small fat containing right inguinal hernia.  No other abnormality seen in the abdomen or pelvis.   Electronically Signed   By: Roque Lias M.D.   On: 01/17/2014 07:16     EKG Interpretation   Date/Time:  Friday January 16 2014 23:47:16 EDT Ventricular Rate:  115 PR Interval:    QRS Duration: 128 QT Interval:  445 QTC Calculation: 616 R Axis:   56 Text Interpretation:  Junctional tachycardia Left bundle branch block No  significant change since last tracing  Confirmed by Latesha Chesney  MD, Karlina Suares ([id number])  on 01/17/2014 12:05:44 AM      MDM   Final diagnoses:  Nausea vomiting and diarrhea  Hyperkalemia  ESRD (end stage renal disease)    63 yo female with abd pain, n/v/d, recent cocaine use, and noted to have hyperkalemia.  EKG with some peaked T waves, but no QRS widening.  After hyperkalemia tx, twaves much improved.  no signs of fluid overload.  Given severe and persistent abd pain, CT scan ordered, without acute finding.  Will d/w nephrology for dialysis in patient today.  Pt is homeless in [place], all possessions at nursing home but reports she does not have a bed there anymore.  No access to medications.  With complicated social issues and n/v/d and abd pain, will d/w hospitalist for admission.    Olivia Mackie, MD 01/17/14 [phone]

## 2014-01-17 NOTE — Procedures (Signed)
I was present at this dialysis session, have reviewed the session itself and made  appropriate changes  Rob Derick Seminara MD (pgr) [phone]    (c) [phone] 01/17/2014, 3:28 PM   

## 2014-01-17 NOTE — H&P (Signed)
[facility] History and Physical  [name] DPO:[id number] DOB: 08/05/1951 DOA: 01/16/2014  Referring physician: Linton Flemings, MD PCP:  Has an ID doc at [facility]- no PCP Specialists: none  Chief Complaint: abdominal pain, vomiting diarrhea since yesterday  HPI: [name] is a 63 y.o. female with PMH of HIV, HTN, ESRD on HD,  as below who was discharged to [facility] on 11/17/13, was discahrged and went to [facility] on Thursday to stay with family (she is homeless). She started smoking Crack Cocaine that same evening- had it about 5 times into the following day and then began having abdominal pain, vomiting and diarrhea. Has not eaten since Thursday morning.   She decided to call the ambulance who brought her back to [facility]. She c/o pain in her abdomen and points to her epigastrium. Not had any further vomiting or diarrhea in the ER.   She also chronic pain in right hip, top of her left foot and big toe on left foot which is currently quite severe- asking of a cream that she was given for it.   Concerned about not having a place to go to from here and not even knowing where she needs to go to get dialyzed now that she is no longer at a SNF.   Being admitted for Uncontrolled abd pain and need for dialysis today as today is her regular day. ER Also concerned about current hyperkalemia- K+ 6.7 (no EKG changes).   General: No anorexia, not fever, weight loss, Cardiac: Denies chest pain, syncope, palpitations, pedal edema  Respiratory: + dyspnea on exertion, cough, shortness of breath since lat night  GI: per HPI GU: Denies hematuria, incontinence, dysuria  Musculoskeletal: per HPI muscle pain-claims she is unable to ambulate at baseline Skin: Denies suspicious skin lesions Neurologic: Denies focal weakness or numbness, change in vision  Past Medical History  Diagnosis Date  . Hypertension   . HIV infection   . CHF (congestive heart failure)   . ESRD (end  stage renal disease) on dialysis 09/30/2013    On dialysis at [facility], per [facility] physicians.  Started dialysis in 2008.  Takes HD MWF schedule.     Past Surgical History  Procedure Laterality Date  . Arteriovenous graft placement     Social History:  reports that she has been smoking.  She does not have any smokeless tobacco history on file. She reports that she drinks alcohol. She reports that she uses illicit drugs (Marijuana) about once per week. Homeless, left here In Feb to go to [facility] Used an Clinical research associate which got stolen - apparently not able to do much of ADLs  Allergies  Allergen Reactions  . Tramadol     "Gives me bad dreams"  . Morphine And Related Hives and Rash      Mother CVA Brother HIV  Prior to Admission medications - HAS NOT TAKEN ANY OF her MEDICATIONS SINCE Thursday AFTER SHE LEFT [facility] "THEY WOULD NOT GIVE THEM TO ME"  Medication Sig Start Date End Date Taking? Authorizing Provider  amitriptyline (ELAVIL) 50 MG tablet Take 50 mg by mouth at bedtime.    Historical Provider, MD  amLODipine (NORVASC) 5 MG tablet Take 1 tablet (5 mg total) by mouth at bedtime. 10/28/13   Modena Jansky, MD  aspirin EC 81 MG EC tablet Take 1 tablet (81 mg total) by mouth daily. 11/12/13   Belkys A Regalado, MD  carvedilol (COREG) 3.125 MG tablet  Take 1 tablet (3.125 mg total) by mouth 2 (two) times daily with a meal. 10/28/13   Modena Jansky, MD  cinacalcet (SENSIPAR) 60 MG tablet Take 60 mg by mouth daily.    Historical Provider, MD  diclofenac sodium (VOLTAREN) 1 % GEL Apply 4 g topically 4 (four) times daily.    Historical Provider, MD  diphenhydrAMINE (BENADRYL) 50 MG capsule Take 1 capsule (50 mg total) by mouth at bedtime. 10/28/13   Modena Jansky, MD  FLUoxetine (PROZAC) 20 MG capsule Take 1 capsule (20 mg total) by mouth daily. 10/28/13   Modena Jansky, MD  HYDROcodone-acetaminophen (NORCO/VICODIN) 5-325 MG per tablet Take 1 tablet by mouth every 4  (four) hours as needed for moderate pain.    Historical Provider, MD  hydrOXYzine (ATARAX/VISTARIL) 25 MG tablet Take 1 tablet (25 mg total) by mouth 3 (three) times daily. 10/28/13   Modena Jansky, MD  lamiVUDine (EPIVIR) 10 MG/ML solution Take 5 mLs (50 mg total) by mouth daily. 10/28/13   Modena Jansky, MD  LORazepam (ATIVAN) 0.5 MG tablet Take 1 tablet (0.5 mg total) by mouth every 6 (six) hours as needed (agitation). 11/18/13   Estill Dooms, MD  multivitamin (RENA-VIT) TABS tablet Take 1 tablet by mouth at bedtime. 10/28/13   Modena Jansky, MD  omeprazole (PRILOSEC) 20 MG capsule Take 20 mg by mouth daily.    Historical Provider, MD  polyethylene glycol (MIRALAX / GLYCOLAX) packet Take 17 g by mouth daily.    Historical Provider, MD  Protein POWD Take 1 scoop by mouth 3 (three) times daily. Mixed with applesauce    Historical Provider, MD  raltegravir (ISENTRESS) 400 MG tablet Take 400 mg by mouth 2 (two) times daily.     Historical Provider, MD  senna-docusate (SENOKOT-S) 8.6-50 MG per tablet Take 2 tablets by mouth daily.    Historical Provider, MD  sevelamer carbonate (RENVELA) 800 MG tablet Take 1,600-2,400 mg by mouth 5 (five) times daily. 3 Tabs with meals and 2 tabs with snacks    Historical Provider, MD  tenofovir (VIREAD) 300 MG tablet Take 300 mg by mouth every Friday. Takes on Fridays.    Historical Provider, MD     Physical Exam: Filed Vitals:   01/17/14 0600  BP: 140/78  Pulse: 111  Temp:   Resp: 17    General: AAO x3, distress- intermittent moaning due to pain, morbidly obese HEENT: Normocephalic and Atraumatic, Mucous membranes pink                PERRLA; EOM intact; No scleral icterus,                 Nares: Patent, Oropharynx: Clear, Fair Dentition                 Neck: FROM, no cervical lymphadenopathy, thyromegaly, carotid bruit or JVD;  Breasts: deferred CHEST WALL: No tenderness  CHEST: Normal respiration, clear to auscultation bilaterally  HEART:  Regular rate and rhythm; no murmurs rubs or gallops  BACK: No kyphosis or scoliosis; no CVA tenderness  ABDOMEN: Positive Bowel Sounds, soft, tender in epigastrium ; no masses, no organomegaly Rectal Exam: deferred EXTREMITIES: No cyanosis, clubbing, or edema- tender in right knee Genitalia: not examined  SKIN:  no rash or ulceration  CNS: Alert and Oriented x 4, Nonfocal exam, CN 2-12 intact  Labs on Admission:  Basic Metabolic Panel:  Recent Labs Lab 01/17/14 0018 01/17/14 0024  NA 135* 131*  K 6.7* 6.6*  CL 89* 98  CO2 24  --   GLUCOSE 123* 119*  BUN 40* 39*  CREATININE 8.37* 9.40*  CALCIUM 9.5  --    Liver Function Tests:  Recent Labs Lab 01/17/14 0018  AST 19  ALT 12  ALKPHOS 252*  BILITOT 0.4  PROT 8.7*  ALBUMIN 4.5    Recent Labs Lab 01/17/14 0018  LIPASE 36   No results found for this basename: AMMONIA,  in the last 168 hours CBC:  Recent Labs Lab 01/17/14 0018 01/17/14 0024  WBC 13.3*  --   NEUTROABS 7.7  --   HGB 12.6 12.6  HCT 36.7 37.0  MCV 105.2*  --   PLT 398  --    Cardiac Enzymes: No results found for this basename: CKTOTAL, CKMB, CKMBINDEX, TROPONINI,  in the last 168 hours  BNP (last 3 results)  Recent Labs  10/06/13 0334 10/21/13 2037 11/08/13 1903  PROBNP 31834.0* 23932.0* 42892.0*   CBG: No results found for this basename: GLUCAP,  in the last 168 hours  Radiological Exams on Admission: Ct Abdomen Pelvis W Contrast  01/17/2014   CLINICAL DATA:  Abdominal pain.  EXAM: CT ABDOMEN AND PELVIS WITH CONTRAST  TECHNIQUE: Multidetector CT imaging of the abdomen and pelvis was performed using the standard protocol following bolus administration of intravenous contrast.  CONTRAST:  157mL OMNIPAQUE IOHEXOL 300 MG/ML  SOLN  COMPARISON:  None.  FINDINGS: Visualized lung bases appear normal. Diffuse sclerosis of the visualized skeleton is noted consistent with a history of end-stage renal disease. Multiple Schmorl's nodes are noted  throughout the spine.  The liver, spleen and pancreas appear normal. No gallstones are noted. Gallbladder does appear to be dilated. Severe atrophy of both kidneys is noted consistent with history of end-stage renal disease. Adrenal glands appear normal. Atherosclerotic calcifications of abdominal aorta are noted without aneurysm formation. The appendix appears normal. There is no evidence of bowel obstruction. No abnormal fluid collection is noted. Small calcified fibroid arises from the uterine fundus. Urinary bladder appears normal. Small fat containing right inguinal hernia is noted. Small fat containing  periumbilical hernia is noted.  No significant adenopathy is noted.  IMPRESSION: Bilateral renal atrophy is noted consistent with history of end-stage renal disease.  Distended gallbladder is noted, but no gallstones or biliary dilatation is noted.  Small calcified uterine fibroid.  Small fat containing periumbilical hernia.  Small fat containing right inguinal hernia.  No other abnormality seen in the abdomen or pelvis.   Electronically Signed   By: Sabino Dick M.D.   On: 01/17/2014 07:16    EKG: Independently reviewed. Sinus Tachycardia at 115  bpm, QTc 616- hower  Assessment/Plan Principal Problem:   Abdominal pain- epigastric  - Alk phos elevated (chronic) -  CT with contrast negative for acute GI etiology-  GI cocktail and Fentanyl given in ER - Protonix, Zofran, low dose morphine PRN, GI cocktail  Active Problems:   HIV infection - resume antiretroviral as tolerated by her abdominal issues - last dose must have been at SNF on Wed or Thurs    Hypertension - resume Norvasc and Coreg    Hyperkalemia - to be managed by dialysis - no EKG changes - given Ca Gluconate, D50 and Insulin in ER   ESRD  - Dr Sharol Given has contacted Nephro- Dr Jonnie Finner is aware  Crack Cocaine Use - advised to quit  Homeless - social services consult - PT eval to see if she can walk-  do not see a record of  one done on past admits  Consulted: Nephrology  Code Status: full code Family Communication: none  Disposition Plan: d/c in 1-2 days to [facility] most likely    Time spent: >45 min  Debbe Odea, MD [facility]  If 7PM-7AM, please contact night-coverage [email or web address] 01/17/2014, 8:41 AM

## 2014-01-17 NOTE — Progress Notes (Signed)
Report received from ED nurse for patient to be admitted into [phone]

## 2014-01-17 NOTE — ED Notes (Signed)
Attempted report 

## 2014-01-17 NOTE — ED Notes (Signed)
Pt states she is hurting all over her body. Requesting pain medication. Vital signs stable. EDP aware. Pt pending for admission.

## 2014-01-17 NOTE — ED Notes (Signed)
Pt. Finished all the contrast and CT called.

## 2014-01-17 NOTE — Consult Note (Signed)
Renal Service Consult Note [facility]  [name] 01/17/2014 Jean Krabbeobert D Kiahna [name] Requesting Physician:  Dr Butler Denmarkizwan  Reason for Consult:  ESRD patient with high K+, poor social situation HPI: The patient is a 63 y.o. year-old with hx of ESRD, HIV, HTN on [facility] since 2010, [place] HD [place] [facility], ESRD due to "drugs" and HTN.  In Feb 2015 after recurrent admissions for missed HD and complications related to that, she was placed in a SNF here in [place] and [place] changed [place] to [facility] on [address]   Patient was recently discharged from the SNF and didn't have a place to live so went back [place] [facility].  There she ended up doing "a lot of drugs", primarily cocaine and "weed". She is here now with some abd pain and nausea.  Wants help finding a place to live. K+ was high at 6.7 and she was treated in ED with IV Ca, insulin, glucose and albuterol nebs.  Patient says she started doing drugs at age 89, primarily cocaine and marijuana.  She has stopped for as long as 4 years in the past, most recently stopped for about 5 months.  She has 5 children, all of them are "on drugs" according to [name] and live in [place], which she says is why she "cannot go back there to live."  She has one son who stopped doing drugs and lives with his wife in [place], but she says she cannot live with him because her's son's wife has a drug problem and he doesn't want his mom to be around her.  She would like to get into a group home or assisted living facility.  She says she has Medicaid and Medicare.   ROS  no CP sob  no fever  no diarrhea  no jt pain  no problems L AVF, using BH technique  Past Medical History  Past Medical History  Diagnosis Date  . Hypertension   . HIV infection   . Morbid obesity   . ESRD (end stage renal disease) on dialysis 09/30/2013    [facility], [facility].  Started dialysis in 2008.  Takes HD MWF  schedule.     Past Surgical History  Past Surgical History  Procedure Laterality Date  . Arteriovenous graft placement     Family History History reviewed. No pertinent family history. Social History  reports that she has been smoking.  She does not have any smokeless tobacco history on file. She reports that she drinks alcohol. She reports that she uses illicit drugs (Marijuana) about once per week. Allergies  Allergies  Allergen Reactions  . Tramadol     "Gives me bad dreams"  . Morphine And Related Hives and Rash   Home medications Prior to Admission medications   Medication Sig Start Date End Date Taking? Authorizing Provider  amitriptyline (ELAVIL) 50 MG tablet Take 50 mg by mouth at bedtime.    Historical Provider, MD  amLODipine (NORVASC) 5 MG tablet Take 1 tablet (5 mg total) by mouth at bedtime. 10/28/13   Elease EtienneAnand D Hongalgi, MD  aspirin EC 81 MG EC tablet Take 1 tablet (81 mg total) by mouth daily. 11/12/13   Belkys A Regalado, MD  carvedilol (COREG) 3.125 MG tablet Take 1 tablet (3.125 mg total) by mouth 2 (two) times daily with a meal. 10/28/13   Elease EtienneAnand D Hongalgi, MD  diclofenac sodium (VOLTAREN) 1 %  GEL Apply 4 g topically 4 (four) times daily.    Historical Provider, MD  diphenhydrAMINE (BENADRYL) 50 MG capsule Take 1 capsule (50 mg total) by mouth at bedtime. 10/28/13   Elease Etienne, MD  FLUoxetine (PROZAC) 20 MG capsule Take 1 capsule (20 mg total) by mouth daily. 10/28/13   Elease Etienne, MD  HYDROcodone-acetaminophen (NORCO/VICODIN) 5-325 MG per tablet Take 1 tablet by mouth every 4 (four) hours as needed for moderate pain.    Historical Provider, MD  hydrOXYzine (ATARAX/VISTARIL) 25 MG tablet Take 1 tablet (25 mg total) by mouth 3 (three) times daily. 10/28/13   Elease Etienne, MD  lamiVUDine (EPIVIR) 10 MG/ML solution Take 5 mLs (50 mg total) by mouth daily. 10/28/13   Elease Etienne, MD  LORazepam (ATIVAN) 0.5 MG tablet Take 1 tablet (0.5 mg total) by mouth every 6  (six) hours as needed (agitation). 11/18/13   Kimber Relic, MD  multivitamin (RENA-VIT) TABS tablet Take 1 tablet by mouth at bedtime. 10/28/13   Elease Etienne, MD  omeprazole (PRILOSEC) 20 MG capsule Take 20 mg by mouth daily.    Historical Provider, MD  polyethylene glycol (MIRALAX / GLYCOLAX) packet Take 17 g by mouth daily.    Historical Provider, MD  Protein POWD Take 1 scoop by mouth 3 (three) times daily. Mixed with applesauce    Historical Provider, MD  raltegravir (ISENTRESS) 400 MG tablet Take 400 mg by mouth 2 (two) times daily.     Historical Provider, MD  senna-docusate (SENOKOT-S) 8.6-50 MG per tablet Take 2 tablets by mouth daily.    Historical Provider, MD  sevelamer carbonate (RENVELA) 800 MG tablet Take 1,600-2,400 mg by mouth 5 (five) times daily. 3 Tabs with meals and 2 tabs with snacks    Historical Provider, MD  tenofovir (VIREAD) 300 MG tablet Take 300 mg by mouth every Friday. Takes on Fridays.    Historical Provider, MD   Liver Function Tests  Recent Labs Lab 01/17/14 0018  AST 19  ALT 12  ALKPHOS 252*  BILITOT 0.4  PROT 8.7*  ALBUMIN 4.5    Recent Labs Lab 01/17/14 0018  LIPASE 36   CBC  Recent Labs Lab 01/17/14 0018 01/17/14 0024  WBC 13.3*  --   NEUTROABS 7.7  --   HGB 12.6 12.6  HCT 36.7 37.0  MCV 105.2*  --   PLT 398  --    Basic Metabolic Panel  Recent Labs Lab 01/17/14 0018 01/17/14 0024  NA 135* 131*  K 6.7* 6.6*  CL 89* 98  CO2 24  --   GLUCOSE 123* 119*  BUN 40* 39*  CREATININE 8.37* 9.40*  CALCIUM 9.5  --     Filed Vitals:   01/17/14 0900 01/17/14 1000 01/17/14 1031 01/17/14 1045  BP: 168/85 145/98 162/93 155/93  Pulse:    104  Temp:   98 F (36.7 C)   TempSrc:   Oral   Resp:   17 10  SpO2:   99% 99%   Exam: Adult AAF in no distress No rash, cyanosis or gangrene Sclera anicteric, throat clear No jvd Chest clear bilat, no rales or wheezing RRR no MRG Abd markedly obese, NTND, no mass or HSM No leg or UE  edeam Neuro is nf, ox3 LFA AVF patent with buttonholes   Dialysis[facility] 4h  2/2.5 [facility]  108kg  400/800  LFA AVF buttonhole  Heparin 3200 Calcitriol 0.5ug each HD   Epo 3000  Venofer 50/wk   Assessment: 1 Hyperkalemia- missed HD 2 ESRD on hemodialysis 3 Homelessness 4 HTN 5 HIV 6 Drug abuse  Plan- HD today upstairs, will follow   Vinson Moselle MD (pgr) [phone]    [phone] 01/17/2014, 11:16 AM

## 2014-01-17 NOTE — ED Notes (Signed)
i stat chem 8 result given to Dr. Norva Riffletter K 6.436mmol/L

## 2014-01-17 NOTE — Progress Notes (Addendum)
NURSING PROGRESS NOTE  Jean CooleyMargaret Ann [name] [id number] Admission Data: 01/17/2014 11:43 AM Attending Provider: Calvert CantorSaima Rizwan, MD WUJ:[id number]:REED, TIFFANY, DO Code Status: Full Code  Jean CooleyMargaret Ann [name] is a 63 y.o. female patient admitted from ED:  -No acute distress noted.  -No complaints of shortness of breath.  -No complaints of chest pain.   Telemetry Box #20 Sinus Tachycardia   Blood pressure 158/97, pulse 104, temperature 97.6 F (36.4 C), temperature source Oral, resp. rate 14, height 5\' 4"  (1.626 m), weight 110.723 kg (244 lb 1.6 oz), SpO2 98.00%.   IV:  IV intact to right hand and right AC. No phlebitis or infiltration noted. Both IV sites are saline locked. AV graft noted to left forearm. Thrill and bruit present.  Allergies:  Tramadol and Morphine and related  Past Medical History:   has a past medical history of Hypertension; HIV infection; Morbid obesity; and ESRD (end stage renal disease) on dialysis (09/30/2013).  Past Surgical History:   has past surgical history that includes Arteriovenous graft placement.  Social History:   reports that she has been smoking.  She does not have any smokeless tobacco history on file. She reports that she drinks alcohol. She reports that she uses illicit drugs (Marijuana) about once per week.  Skin: Pt has old incision scar noted to mid abdomen. Scar noted to right mid back with no drainage noted. Pt stated "There used to be  bubble there and when it popped that's been there ever since." Two intact scabbs noted to left forearm where AV graft is placed. No drainage noted. Bilateral feet dry and cracking. Scattered discoloration noted to posterior upper neck. Pt is unsure of how she got the discoloration. Scattered bruising noted to left arm pt stated "where they stuck me."  Patient/Family orientated to room. Information packet given to patient/family. Admission inpatient armband information verified with patient/family to include name and date of  birth and placed on patient arm. Side rails up x 2, fall assessment and education completed with patient/family. Patient/family able to verbalize understanding of risk associated with falls and verbalized understanding to call for assistance before getting out of bed. Call light within reach. Patient/family able to voice and demonstrate understanding of unit orientation instructions.    Will continue to evaluate and treat per MD orders.  Cathlyn Parsonsattha Trenisha Lafavor, RN

## 2014-01-17 NOTE — Progress Notes (Signed)
MD notified of pt allergy to Morphine Sulfate. Orders received.

## 2014-01-17 NOTE — ED Notes (Signed)
Admitting physician at the bedside.

## 2014-01-18 DIAGNOSIS — R112 Nausea with vomiting, unspecified: Secondary | ICD-10-CM

## 2014-01-18 DIAGNOSIS — R197 Diarrhea, unspecified: Secondary | ICD-10-CM

## 2014-01-18 LAB — BASIC METABOLIC PANEL
BUN: 28 mg/dL — ABNORMAL HIGH (ref 6–23)
CALCIUM: 8.2 mg/dL — AB (ref 8.4–10.5)
CHLORIDE: 87 meq/L — AB (ref 96–112)
CO2: 25 meq/L (ref 19–32)
CREATININE: 6.85 mg/dL — AB (ref 0.50–1.10)
GFR calc Af Amer: 7 mL/min — ABNORMAL LOW (ref >90)
GFR calc non Af Amer: 6 mL/min — ABNORMAL LOW (ref >90)
Glucose, Bld: 135 mg/dL — ABNORMAL HIGH (ref 70–99)
Potassium: 5.4 mEq/L — ABNORMAL HIGH (ref 3.7–5.3)
Sodium: 129 mEq/L — ABNORMAL LOW (ref 137–147)

## 2014-01-18 MED ORDER — HYDROCODONE-ACETAMINOPHEN 5-325 MG PO TABS
1.0000 | ORAL_TABLET | Freq: Once | ORAL | Status: AC
  Administered 2014-01-18: 1 via ORAL
  Filled 2014-01-18: qty 1

## 2014-01-18 MED ORDER — OXYCODONE HCL 5 MG PO TABS
5.0000 mg | ORAL_TABLET | Freq: Four times a day (QID) | ORAL | Status: DC | PRN
  Administered 2014-01-18 – 2014-01-20 (×4): 5 mg via ORAL
  Filled 2014-01-18 (×5): qty 1

## 2014-01-18 NOTE — Progress Notes (Addendum)
No IV drugs ordered at this time. Report from night shift that pt refused peripheral IV insertion. IV team notified that pt has no IV access.  Pt refused peripheral IV insertion this shift. Education provided to patient, but she continues to refuse. Pt refusing lab draws x 3 at this time stating "I don't want them sticking me" MD aware.

## 2014-01-18 NOTE — Progress Notes (Signed)
PT Cancellation Note  Patient Details Name: [name] MRN: [id number] DOB: December 14, 1950   Cancelled Treatment:    Reason Eval/Treat Not Completed: Medical issues which prohibited therapy. Pt continues to refuse CBG's at this time and IV insertion. No labs since last recorded K of 6.6. Will re-attempt to see pt when she is medically appropriate and participative.    [name], [place] [phone] 01/18/2014, 1:36 PM

## 2014-01-18 NOTE — Progress Notes (Signed)
  [name] Progress Note   Subjective: Feels good, no complaints  Filed Vitals:   01/17/14 2155 01/18/14 0455 01/18/14 0834 01/18/14 1216  BP: 139/76 134/88 129/88 108/62  Pulse: 100 97 96 97  Temp: 98.2 F (36.8 C) 98.3 F (36.8 C)    TempSrc: Oral Oral    Resp: 20 18  20   Height:      Weight:  110.8 kg (244 lb 4.3 oz)    SpO2: 94% 98%  98%   Exam: Adult AAF in no distress  No jvd  Chest clear bilat, no rales or wheezing  RRR no MRG  Abd obese, NTND, no mass or HSM  No leg or UE edema Neuro is nf, ox3  LFA AVF patent with buttonholes, no signs of infection  Dialysis[facility]  4h 2/2.5 [facility] 108kg 400/800 LFA AVF buttonhole Heparin 3200  Calcitriol 0.5ug each HD Epo 3000 Venofer 50/wk   Assessment:  1 Hyperkalemia- due to missed HD; should be better after HD yest, repeat lab pending today 2 ESRD on hemodialysis since 2009; [facility] until recently placed in SNF in [place] 3 Homelessness- was just d/c'd from SNF in [place] and doesn't want to live w family in [place] but has no place to stay in [place]  4 HTN  5 HIV  6 Drug abuse  Plan: SW evaluating situation, plan HD TTS as inpatient while here    Vinson Moselleob Denene Alamillo MD  pager [phone]    cell [phone]  01/18/2014, 2:06 PM     Recent Labs Lab 01/17/14 0018 01/17/14 0024  NA 135* 131*  K 6.7* 6.6*  CL 89* 98  CO2 24  --   GLUCOSE 123* 119*  BUN 40* 39*  CREATININE 8.37* 9.40*  CALCIUM 9.5  --     Recent Labs Lab 01/17/14 0018  AST 19  ALT 12  ALKPHOS 252*  BILITOT 0.4  PROT 8.7*  ALBUMIN 4.5    Recent Labs Lab 01/17/14 0018 01/17/14 0024  WBC 13.3*  --   NEUTROABS 7.7  --   HGB 12.6 12.6  HCT 36.7 37.0  MCV 105.2*  --   PLT 398  --    . amitriptyline  50 mg Oral QHS  . amLODipine  5 mg Oral QHS  . carvedilol  3.125 mg Oral BID WC  . diclofenac sodium  4 g Topical QID  . diphenhydrAMINE  50 mg Oral QHS  . FLUoxetine  20 mg Oral Daily  . heparin   3,200 Units Dialysis Once in dialysis  . heparin  5,000 Units Subcutaneous 3 times per day  . lamiVUDine  50 mg Oral Daily  . pantoprazole  40 mg Oral Daily  . raltegravir  400 mg Oral BID  . sevelamer carbonate  2,400 mg Oral TID WC  . [START ON 01/23/2014] tenofovir  300 mg Oral Q Fri     feeding supplement (NEPRO CARB STEADY), fentaNYL, gi cocktail, heparin, lidocaine (PF), lidocaine-prilocaine, LORazepam, oxyCODONE, pentafluoroprop-tetrafluoroeth, sevelamer carbonate

## 2014-01-18 NOTE — Progress Notes (Addendum)
PATIENT DETAILS Name: [name] Age: 63 y.o. Sex: female Date of Birth: 05-31-1951 Admit Date: 01/16/2014 Admitting Physician Calvert Cantor, MD ZOX:WRUE, TIFFANY, DO  Subjective: Abdominal pain, nausea and vomiting completely resolved.  Assessment/Plan: Principal Problem:   Abdominal pain - Likely secondary to gastritis- abdominal pain completely resolved with supportive care - Continue with as needed GI cocktail, Protonix - CT scan of the abdomen on 01/17/14 and negative for acute abnormalities. Abdominal exam is very benign, suspect no further workup required at this time  Active Problems: Vomiting - Resolved with supportive care - Advanced to a renal diet  Hyperkalemia - Secondary to missed dialysis - Underwent dialysis on 4/11- will repeat renal panel today  Hypertension - controlled, continue Norvasc and Coreg  End-stage renal disease - Neurology following, dialysis schedule is Tuesday, Thursdays and Saturdays  Anemia of chronic disease - Defer to nephrology  HIV - Continue antiretrovirals  Polysubstance use-cocaine/marijuana - Counseled extensively  Homelessness - Child psychotherapist evaluation, patient interested in pursuing assisted living facility placement  Disposition: Remain inpatient  DVT Prophylaxis: Prophylactic Heparin   Code Status: Full code   Family Communication None at bedside  Procedures:  None  CONSULTS:  nephrology  Time spent 40 minutes-which includes 50% of the time with face-to-face with patient/ family and coordinating care related to the above assessment and plan.    MEDICATIONS: Scheduled Meds: . amitriptyline  50 mg Oral QHS  . amLODipine  5 mg Oral QHS  . carvedilol  3.125 mg Oral BID WC  . diclofenac sodium  4 g Topical QID  . diphenhydrAMINE  50 mg Oral QHS  . FLUoxetine  20 mg Oral Daily  . heparin  3,200 Units Dialysis Once in dialysis  . heparin  5,000 Units Subcutaneous 3 times per day  .  lamiVUDine  50 mg Oral Daily  . pantoprazole  40 mg Oral Daily  . raltegravir  400 mg Oral BID  . sevelamer carbonate  2,400 mg Oral TID WC  . [START ON 01/23/2014] tenofovir  300 mg Oral Q Fri   Continuous Infusions:  PRN Meds:.feeding supplement (NEPRO CARB STEADY), fentaNYL, gi cocktail, heparin, lidocaine (PF), lidocaine-prilocaine, LORazepam, pentafluoroprop-tetrafluoroeth, sevelamer carbonate  Antibiotics: Anti-infectives   Start     Dose/Rate Route Frequency Ordered Stop   01/23/14 1000  tenofovir (VIREAD) tablet 300 mg     300 mg Oral Every Fri 01/17/14 1117     01/17/14 1130  lamiVUDine (EPIVIR) 10 MG/ML solution 50 mg     50 mg Oral Daily 01/17/14 1117     01/17/14 1130  raltegravir (ISENTRESS) tablet 400 mg     400 mg Oral 2 times daily 01/17/14 1117         PHYSICAL EXAM: Vital signs in last 24 hours: Filed Vitals:   01/17/14 2153 01/17/14 2155 01/18/14 0455 01/18/14 0834  BP: 136/76 139/76 134/88 129/88  Pulse:  100 97 96  Temp:  98.2 F (36.8 C) 98.3 F (36.8 C)   TempSrc:  Oral Oral   Resp:  20 18   Height:      Weight:   110.8 kg (244 lb 4.3 oz)   SpO2:  94% 98%     Weight change:  Filed Weights   01/17/14 1315 01/17/14 1655 01/18/14 0455  Weight: 111.1 kg (244 lb 14.9 oz) 108.3 kg (238 lb 12.1 oz) 110.8 kg (244 lb 4.3 oz)   Body mass index is 41.91 kg/(m^2).  Gen Exam: Awake and alert with clear speech.   Neck: Supple, No JVD.   Chest: B/L Clear.   CVS: S1 S2 Regular, no murmurs.  Abdomen: soft, BS +, non tender, non distended.  Extremities: no edema, lower extremities warm to touch. Neurologic: Non Focal.   Skin: No Rash.  Wounds: N/A.    Intake/Output from previous day:  Intake/Output Summary (Last 24 hours) at 01/18/14 1125 Last data filed at 01/18/14 1018  Gross per 24 hour  Intake   1490 ml  Output   2697 ml  Net  -1207 ml     LAB RESULTS: CBC  Recent Labs Lab 01/17/14 0018 01/17/14 0024  WBC 13.3*  --   HGB 12.6 12.6    HCT 36.7 37.0  PLT 398  --   MCV 105.2*  --   MCH 36.1*  --   MCHC 34.3  --   RDW 13.9  --   LYMPHSABS 4.3*  --   MONOABS 1.3*  --   EOSABS 0.0  --   BASOSABS 0.0  --     Chemistries   Recent Labs Lab 01/17/14 0018 01/17/14 0024  NA 135* 131*  K 6.7* 6.6*  CL 89* 98  CO2 24  --   GLUCOSE 123* 119*  BUN 40* 39*  CREATININE 8.37* 9.40*  CALCIUM 9.5  --     CBG: No results found for this basename: GLUCAP,  in the last 168 hours  GFR Estimated Creatinine Clearance: 7.6 ml/min (by C-G formula based on Cr of 9.4).  Coagulation profile No results found for this basename: INR, PROTIME,  in the last 168 hours  Cardiac Enzymes No results found for this basename: CK, CKMB, TROPONINI, MYOGLOBIN,  in the last 168 hours  No components found with this basename: POCBNP,  No results found for this basename: DDIMER,  in the last 72 hours No results found for this basename: HGBA1C,  in the last 72 hours No results found for this basename: CHOL, HDL, LDLCALC, TRIG, CHOLHDL, LDLDIRECT,  in the last 72 hours No results found for this basename: TSH, T4TOTAL, FREET3, T3FREE, THYROIDAB,  in the last 72 hours No results found for this basename: VITAMINB12, FOLATE, FERRITIN, TIBC, IRON, RETICCTPCT,  in the last 72 hours  Recent Labs  01/17/14 0018  LIPASE 36    Urine Studies No results found for this basename: UACOL, UAPR, USPG, UPH, UTP, UGL, UKET, UBIL, UHGB, UNIT, UROB, ULEU, UEPI, UWBC, URBC, UBAC, CAST, CRYS, UCOM, BILUA,  in the last 72 hours  MICROBIOLOGY: Recent Results (from the past 240 hour(s))  MRSA PCR SCREENING     Status: None   Collection Time    01/17/14 12:39 PM      Result Value Ref Range Status   MRSA by PCR NEGATIVE  NEGATIVE Final   Comment:            The GeneXpert MRSA Assay (FDA     approved for NASAL specimens     only), is one component of a     comprehensive MRSA colonization     surveillance program. It is not     intended to diagnose MRSA      infection nor to guide or     monitor treatment for     MRSA infections.    RADIOLOGY STUDIES/RESULTS: Ct Abdomen Pelvis W Contrast  01/17/2014   CLINICAL DATA:  Abdominal pain.  EXAM: CT ABDOMEN AND PELVIS WITH CONTRAST  TECHNIQUE: Multidetector CT imaging  of the abdomen and pelvis was performed using the standard protocol following bolus administration of intravenous contrast.  CONTRAST:  100mL OMNIPAQUE IOHEXOL 300 MG/ML  SOLN  COMPARISON:  None.  FINDINGS: Visualized lung bases appear normal. Diffuse sclerosis of the visualized skeleton is noted consistent with a history of end-stage renal disease. Multiple Schmorl's nodes are noted throughout the spine.  The liver, spleen and pancreas appear normal. No gallstones are noted. Gallbladder does appear to be dilated. Severe atrophy of both kidneys is noted consistent with history of end-stage renal disease. Adrenal glands appear normal. Atherosclerotic calcifications of abdominal aorta are noted without aneurysm formation. The appendix appears normal. There is no evidence of bowel obstruction. No abnormal fluid collection is noted. Small calcified fibroid arises from the uterine fundus. Urinary bladder appears normal. Small fat containing right inguinal hernia is noted. Small fat containing  periumbilical hernia is noted.  No significant adenopathy is noted.  IMPRESSION: Bilateral renal atrophy is noted consistent with history of end-stage renal disease.  Distended gallbladder is noted, but no gallstones or biliary dilatation is noted.  Small calcified uterine fibroid.  Small fat containing periumbilical hernia.  Small fat containing right inguinal hernia.  No other abnormality seen in the abdomen or pelvis.   Electronically Signed   By: Roque LiasJames  Green M.D.   On: 01/17/2014 07:16   Dg Chest Portable 1 View  01/06/2014   CLINICAL DATA:  Short of breath, chest pain  EXAM: PORTABLE CHEST - 1 VIEW  COMPARISON:  Prior chest x-ray 11/08/2013  FINDINGS: Diffuse  bilateral interstitial and airspace opacities most consistent with interstitial pulmonary edema. Linear atelectasis in the left mid lung. Stable cardiac and mediastinal contours. No definite effusion. No pneumothorax. No focal airspace consolidation. No acute osseous abnormality.  IMPRESSION: Moderate interstitial pulmonary edema.  Left mid lung subsegmental atelectasis.   Electronically Signed   By: Malachy MoanHeath  McCullough M.D.   On: 01/06/2014 02:48    Shanker Levora DredgeM Ghimire, MD  [facility] Pager:[phone]  If 7PM-7AM, please contact night-coverage [email or web address] Password [email or web address] 01/18/2014, 11:25 AM   LOS: 2 days

## 2014-01-18 NOTE — Progress Notes (Signed)
Pt IV began leaking once RN administered IV fentanyl. Attempted to look for another access, but unsuccessful. RN paged IV team. IV team RN came to pt room and pt refused IV restart. Stated "I want to wait for the doctor to look at it". RN explained to pt that the IV site is no longer able to be used and pt stated "just leave it so the doctor can take a look at it". MD on call made aware. Will continue to monitor.

## 2014-01-18 NOTE — Progress Notes (Signed)
Pt was informed that she is on a renal diet with 1200 ml fluid restriction per day. Reviewed renal diet packet with patient. Pt nonadherent to diet and calling staff to bring extra food and drinks. Pt seen with bags of various snacks stating "my friend brought it for me." Pt reeducated about renal diet and the importance abiding to diet. Pt continues to be nonadherent stating "I know what I'm doing."

## 2014-01-18 NOTE — Clinical Social Work Psychosocial (Signed)
Clinical Social Work Department BRIEF PSYCHOSOCIAL ASSESSMENT 01/18/2014  Patient:  [name], [name]     Account Number:  [id number]     Admit date:  01/16/2014  Clinical Social Worker:  Hubert Azure  Date/Time:  01/18/2014 05:25 PM  Referred by:  Physician  Date Referred:  01/18/2014 Referred for  Homelessness   Other Referral:   Interview type:  Patient Other interview type:    PSYCHOSOCIAL DATA Living Status:  OTHER Admitted from facility:   Level of care:   Primary support name:   Primary support relationship to patient:   Degree of support available:    CURRENT CONCERNS Current Concerns  Other - See comment   Other Concerns:    SOCIAL WORK ASSESSMENT / PLAN CSW met with patient who was alert and oriented x4 at bedside. CSW introduced self. CSW asked patient about current housing situation. Patient stated she was residing at [facility], but left because she was not allowed to smoke and was always placed in a room with someone with a colostomy bag. Patient reported residing at [facility] for 3 months. Patient further stated she went to [facility] on Friday, but they did not have a bed. CSW asked patient if she had family residing in [place]. Patient reported residing with family in [place], [place] prior to coming to [facility]. Patient stated the living situation with family members did not work out, so she came to [place]. Patient stated she does not have family or friends residing in [place], but prefers to live in [place] because she is familiar with the area and does not want to get used to another area. Patient stated she would like to stay at a [facility] as she has HIV, but not AIDS. CSW provided patient with list of shelters and discussed shelters as a back-up plan to placement, because she does not have family to reside with in [place]. CSW asked patient about support. Patient stated she has people, but they cannot  provide her with anywhere to live right now. CSW verbalized understanding of the above and provided patient with appropriate emotional support.   Assessment/plan status:  Information/Referral to [employer] Other assessment/ plan:   Information/referral to community resources:   CSW provided patient with [employer].    PATIENT'S/FAMILY'S RESPONSE TO PLAN OF CARE: Patient was appreciative and stated she feels she will get the support and assistance she needs.    Willacy, [place] Weekend Clinical Social Worker [phone]

## 2014-01-19 ENCOUNTER — Inpatient Hospital Stay (HOSPITAL_COMMUNITY): Payer: Medicare Other

## 2014-01-19 ENCOUNTER — Encounter (HOSPITAL_COMMUNITY): Payer: Self-pay | Admitting: *Deleted

## 2014-01-19 LAB — CBC
HEMATOCRIT: 30.4 % — AB (ref 36.0–46.0)
HEMOGLOBIN: 10.4 g/dL — AB (ref 12.0–15.0)
MCH: 36.2 pg — ABNORMAL HIGH (ref 26.0–34.0)
MCHC: 34.2 g/dL (ref 30.0–36.0)
MCV: 105.9 fL — AB (ref 78.0–100.0)
Platelets: 267 10*3/uL (ref 150–400)
RBC: 2.87 MIL/uL — ABNORMAL LOW (ref 3.87–5.11)
RDW: 13.7 % (ref 11.5–15.5)
WBC: 5.6 10*3/uL (ref 4.0–10.5)

## 2014-01-19 LAB — RENAL FUNCTION PANEL
Albumin: 3.6 g/dL (ref 3.5–5.2)
BUN: 35 mg/dL — AB (ref 6–23)
CALCIUM: 8.3 mg/dL — AB (ref 8.4–10.5)
CO2: 25 meq/L (ref 19–32)
Chloride: 87 mEq/L — ABNORMAL LOW (ref 96–112)
Creatinine, Ser: 7.94 mg/dL — ABNORMAL HIGH (ref 0.50–1.10)
GFR calc non Af Amer: 5 mL/min — ABNORMAL LOW (ref >90)
GFR, EST AFRICAN AMERICAN: 6 mL/min — AB (ref >90)
GLUCOSE: 96 mg/dL (ref 70–99)
Phosphorus: 3.3 mg/dL (ref 2.3–4.6)
Potassium: 5.6 mEq/L — ABNORMAL HIGH (ref 3.7–5.3)
SODIUM: 131 meq/L — AB (ref 137–147)

## 2014-01-19 LAB — URIC ACID: Uric Acid, Serum: 3.9 mg/dL (ref 2.4–7.0)

## 2014-01-19 MED ORDER — SEVELAMER CARBONATE 800 MG PO TABS
1600.0000 mg | ORAL_TABLET | Freq: Three times a day (TID) | ORAL | Status: DC
  Administered 2014-01-19 – 2014-01-21 (×6): 1600 mg via ORAL
  Filled 2014-01-19 (×8): qty 2

## 2014-01-19 MED ORDER — DOXERCALCIFEROL 4 MCG/2ML IV SOLN
2.0000 ug | INTRAVENOUS | Status: DC
  Administered 2014-01-20: 2 ug via INTRAVENOUS
  Filled 2014-01-19: qty 2

## 2014-01-19 MED ORDER — DICLOFENAC SODIUM 1 % TD GEL
4.0000 g | Freq: Four times a day (QID) | TRANSDERMAL | Status: DC
  Administered 2014-01-19 – 2014-01-21 (×8): 4 g via TOPICAL

## 2014-01-19 MED ORDER — SODIUM CHLORIDE 0.9 % IV BOLUS (SEPSIS): 250.0000 mL | Freq: Once | INTRAVENOUS | Status: DC

## 2014-01-19 NOTE — Progress Notes (Signed)
HPI: Dr. Jerral RalphGhimire called this NP tonight asking this NP to see pt at bedside due to continued c/o being dizzy. Dr. Jerral RalphGhimire had ordered a CT scan of the head which showed white matter changes and ? HIV encephalopathy. No acute finding.  S: Pt states she is dizzy when [facility] up and down and when she gets up to chair. This lasts approximately 1 minute and she describes as something "funny happening in my head". She points to frontal/forehead as where this occurs. She denies any vision changes, slurred speech, weakness, HA, palpitations, chest pain, cough, sinus congestion, hearing issue, ear pain, or SOB. No neuro changes which were reviewed with pt. When this NP explained what I thought the issue was and the results of her CT head, she was flippant and was only interested in getting something to eat. I explained she was on a special renal diet which is to protect her kidneys. This NP explained orthostatic hypotension which she argued with me about "that is not what is going on" and again she is only concerned about eating.  O: VS reviewed. Well appearing, chronically ill AAF in no distress. Her neuro exam is completely normal. She is uncooperative for some of the exam, like moving her right leg (because of pain) and refused orthostatic BPs.  A/P: 1. Non compliance-RN says pt is refusing all meds, vital signs, orthostatic checks, getting OOB, and IV bolus. Until she gets "something to eat". This NP asked RN to call renal floor and see if they had any suggestions for a snack on a renal diet.  2. Dizziness-find nothing on exam to support except her report of this occuring when she moves [facility] or gets up to chair. She is uncooperative for orthostatic exam. Her head CT showed nothing acute. IVF bolus refused.  3. ESRD-to have HD tomorrow.  Jimmye NormanKaren Kirby-Graham, NP [facility]

## 2014-01-19 NOTE — Progress Notes (Signed)
Patient complains of mild lightheadedness, no headache; continue to be uncooperative asking for more fluids stating that she knows what is good for her.

## 2014-01-19 NOTE — Evaluation (Signed)
Physical Therapy Evaluation Patient Details Name: Jean CooleyMargaret Ann [name] MRN: [id number] DOB: 1951-01-13 Today's Date: 01/19/2014   History of Present Illness  Patient was recently discharged from the SNF and didn't have a place to live so [facility].  There she ended up doing "a lot of drugs", primarily cocaine and "weed". She is here now with some abd pain and nausea.  Clinical Impression  Patient demonstrates deficits in mobility as indicated. Patient reluctant to participate with therapy. Will trial skilled PT if patient will to participate. Recommend SNF upon discharge.  Patient reports NON AMBULATORY AT BASELINE (2 years)    Follow Up Recommendations SNF    Equipment Recommendations   (TBD)    Recommendations for Other Services       Precautions / Restrictions Precautions Precautions: Fall      Mobility  Bed Mobility Overal bed mobility: Needs Assistance Bed Mobility: Supine to Sit     Supine to sit: Min assist     General bed mobility comments: Assist to move LEs as patient reports she can not move them, but then patient was able to move them and complete task without continued assist.  Transfers Overall transfer level: Needs assistance Equipment used: None Transfers: Squat Pivot Transfers     Squat pivot transfers: Supervision;Min guard     General transfer comment: patient impulsively performed transfer without waiting for this writers assistance. Patient very impulsive. Refuses to stand to or perform any further mobility.  Ambulation/Gait             General Gait Details: patient refusing to perform ambulation attempt or full standing attempt at this time.  Stairs            Wheelchair Mobility    Modified Rankin (Stroke Patients Only)       Balance                                             Pertinent Vitals/Pain patient reports 10/10 pain    Home Living Family/patient expects to be discharged to::  Shelter/Homeless                 Additional Comments: pt lives in trailer with friends; reports she has been having increased difficulty getting around house and performing ADLs; pt has roommates help her but not all day; rommates work during the day     Prior Function Level of Independence: Needs assistance   Gait / Transfers Assistance Needed: per patient, uses electric wheelchair for mobility, trasnfers without assist  ADL's / Homemaking Assistance Needed: requires (A) for washing back side and donning socks/shoes at times; reports she does not always have 24/7 (A)   Comments: Pt providing information that she uses electric wheelchair at baseline (states has not ambulated in 2 years however, this conflicts information on file per previous admissions)     Hand Dominance   Dominant Hand: Right    Extremity/Trunk Assessment   Upper Extremity Assessment: Overall WFL for tasks assessed           Lower Extremity Assessment: Overall WFL for tasks assessed;Generalized weakness;RLE deficits/detail;LLE deficits/detail RLE Deficits / Details: right knee pain reported LLE Deficits / Details: left foot pain reported     Communication   Communication: No difficulties  Cognition Arousal/Alertness: Awake/alert Behavior During Therapy: Restless;Agitated;Anxious;Impulsive Overall Cognitive Status: No family/caregiver present to determine baseline  cognitive functioning                      General Comments General comments (skin integrity, edema, etc.): patient reluctant to participate. States that she is not going to stand or walk.  When questioned about how she performs transfers she states she just scoots fromt he bed onto the electric chair.     Exercises        Assessment/Plan    PT Assessment Patient needs continued PT services  PT Diagnosis Difficulty walking;Generalized weakness;Acute pain   PT Problem List Decreased strength;Decreased range of  motion;Decreased activity tolerance;Decreased balance;Decreased mobility;Decreased knowledge of use of DME;Decreased safety awareness;Pain  PT Treatment Interventions DME instruction;Gait training;Functional mobility training;Therapeutic activities;Therapeutic exercise;Balance training;Patient/family education   PT Goals (Current goals can be found in the Care Plan section) Acute Rehab PT Goals Patient Stated Goal: none stated PT Goal Formulation: With patient Time For Goal Achievement: 02/02/14 Potential to Achieve Goals: Fair    Frequency Min 2X/week   Barriers to discharge   homeless    Co-evaluation               End of Session   Activity Tolerance: Patient limited by pain;Treatment limited secondary to agitation Patient left: in chair;with call bell/phone within reach;with chair alarm set Nurse Communication: Mobility status         Time: 1208-1225 PT Time Calculation (min): 17 min   Charges:   PT Evaluation $Initial PT Evaluation Tier I: 1 Procedure PT Treatments $Therapeutic Activity: 8-22 mins   PT G Codes:          Fabio AsaDevon J Burnetta Kohls 01/19/2014, 2:02 PM Charlotte Crumbevon Homar Weinkauf, PT DPT  [phone]

## 2014-01-19 NOTE — Progress Notes (Signed)
Pt refused to stand up to check orthostatic VS.

## 2014-01-19 NOTE — Progress Notes (Signed)
S: No CO  Feels well.  Biggest issue is social O:BP 153/62  Pulse 92  Temp(Src) 97.7 F (36.5 C) (Oral)  Resp 20  Ht 5\' 4"  (1.626 m)  Wt 112.2 kg (247 lb 5.7 oz)  BMI 42.44 kg/m2  SpO2 98%  Intake/Output Summary (Last 24 hours) at 01/19/14 1002 Last data filed at 01/18/14 2112  Gross per 24 hour  Intake    375 ml  Output      0 ml  Net    375 ml   Weight change: 1.477 kg (3 lb 4.1 oz) WUJ:WJXBJGen:awake and alert CVS:RRR Resp:Clear Abd:+ BS NTND Ext:no edema  L AVF + bruit NEURO:CNI M&SI Ox3 no asterixis    . amitriptyline  50 mg Oral QHS  . amLODipine  5 mg Oral QHS  . carvedilol  3.125 mg Oral BID WC  . diclofenac sodium  4 g Topical QID  . diphenhydrAMINE  50 mg Oral QHS  . FLUoxetine  20 mg Oral Daily  . heparin  3,200 Units Dialysis Once in dialysis  . heparin  5,000 Units Subcutaneous 3 times per day  . lamiVUDine  50 mg Oral Daily  . pantoprazole  40 mg Oral Daily  . raltegravir  400 mg Oral BID  . sevelamer carbonate  2,400 mg Oral TID WC  . [START ON 01/23/2014] tenofovir  300 mg Oral Q Fri   No results found. BMET    Component Value Date/Time   NA 131* 01/19/2014 0715   K 5.6* 01/19/2014 0715   CL 87* 01/19/2014 0715   CO2 25 01/19/2014 0715   GLUCOSE 96 01/19/2014 0715   BUN 35* 01/19/2014 0715   CREATININE 7.94* 01/19/2014 0715   CALCIUM 8.3* 01/19/2014 0715   CALCIUM 8.8 11/18/2013 0701   GFRNONAA 5* 01/19/2014 0715   GFRAA 6* 01/19/2014 0715   CBC    Component Value Date/Time   WBC 5.6 01/19/2014 0715   RBC 2.87* 01/19/2014 0715   RBC 2.87* 05/11/2012 1228   HGB 10.4* 01/19/2014 0715   HCT 30.4* 01/19/2014 0715   PLT 267 01/19/2014 0715   MCV 105.9* 01/19/2014 0715   MCH 36.2* 01/19/2014 0715   MCHC 34.2 01/19/2014 0715   RDW 13.7 01/19/2014 0715   LYMPHSABS 4.3* 01/17/2014 0018   MONOABS 1.3* 01/17/2014 0018   EOSABS 0.0 01/17/2014 0018   BASOSABS 0.0 01/17/2014 0018     Assessment: 1. Hyperkalemia 2. ESRD 3. HIV 4. Anemia with high MCV  Check B12 and  folate 5. HTN 6. Drug abuse 7. Homeless  Plan: 1. HD in AM 2. Once she decides where she will be living then will get her an appt at the nearest HD unit 3. Decrease renvela to 2 AC 4. Start hectorol   Dyke MaesMichael T Satoria Dunlop

## 2014-01-19 NOTE — Progress Notes (Signed)
MD notified about patient complaints and received an orde for CT head without contrast. Will continue to monitor the patient.

## 2014-01-19 NOTE — Progress Notes (Signed)
PATIENT DETAILS Name: Jean CooleyMargaret Ann [name] Age: 63 y.o. Sex: female Date of Birth: Feb 15, 1951 Admit Date: 01/16/2014 Admitting Physician Calvert CantorSaima Rizwan, MD ZOX:WRUEPCP:REED, TIFFANY, DO  Subjective: Abdominal pain, nausea and vomiting completely resolved. Patient complains of her great left toe being painful to touch, and of dizziness when moving her head.  Assessment/Plan: Principal Problem:    Abdominal pain - Likely secondary to gastritis- abdominal pain completely resolved with supportive care - Continue with as needed GI cocktail, Protonix - CT scan of the abdomen on 01/17/14 and negative for acute abnormalities.  - Abdominal exam is very benign, suspect no further workup required at this time  Active Problems:  Vomiting - Resolved with supportive care - Advanced to a renal diet  Hyperkalemia - Secondary to missed dialysis - Underwent dialysis on 4/11- will repeat renal panel today  Hypertension - controlled, continue Norvasc and Coreg  End-stage renal disease - Neurology following, dialysis schedule is Tuesday, Thursdays and Saturdays  Anemia of chronic disease - Defer to nephrology  HIV - Continue antiretrovirals  Polysubstance use-cocaine/marijuana - Counseled extensively  Homelessness - Child psychotherapistocial worker evaluation, patient interested in pursuing SNF placement  Dizziness -Will check orthostatics -PT evaluation  Left great toe pain. -Check uric acid level.  No erythema or swelling. -On Voltaren gel  Disposition: Remain inpatient will d/c to SNF when bed and HD chair arranged.  DVT Prophylaxis: Prophylactic Heparin   Code Status: Full code   Family Communication None at bedside  Procedures:  None  CONSULTS:  nephrology  Time spent 40 minutes-which includes 50% of the time with face-to-face with patient/ family and coordinating care related to the above assessment and plan.    MEDICATIONS: Scheduled Meds: . amitriptyline  50 mg Oral  QHS  . amLODipine  5 mg Oral QHS  . carvedilol  3.125 mg Oral BID WC  . diclofenac sodium  4 g Topical QID  . diphenhydrAMINE  50 mg Oral QHS  . [START ON 01/20/2014] doxercalciferol  2 mcg Intravenous Q T,Th,Sa-HD  . FLUoxetine  20 mg Oral Daily  . heparin  3,200 Units Dialysis Once in dialysis  . heparin  5,000 Units Subcutaneous 3 times per day  . lamiVUDine  50 mg Oral Daily  . pantoprazole  40 mg Oral Daily  . raltegravir  400 mg Oral BID  . sevelamer carbonate  1,600 mg Oral TID WC  . [START ON 01/23/2014] tenofovir  300 mg Oral Q Fri   Continuous Infusions:  PRN Meds:.feeding supplement (NEPRO CARB STEADY), fentaNYL, gi cocktail, heparin, lidocaine (PF), lidocaine-prilocaine, LORazepam, oxyCODONE, pentafluoroprop-tetrafluoroeth, sevelamer carbonate  Antibiotics: Anti-infectives   Start     Dose/Rate Route Frequency Ordered Stop   01/23/14 1000  tenofovir (VIREAD) tablet 300 mg     300 mg Oral Every Fri 01/17/14 1117     01/17/14 1130  lamiVUDine (EPIVIR) 10 MG/ML solution 50 mg     50 mg Oral Daily 01/17/14 1117     01/17/14 1130  raltegravir (ISENTRESS) tablet 400 mg     400 mg Oral 2 times daily 01/17/14 1117         PHYSICAL EXAM: Vital signs in last 24 hours: Filed Vitals:   01/18/14 1433 01/18/14 1723 01/18/14 2112 01/19/14 0534  BP: 142/84 140/74 150/83 153/62  Pulse: 89 98 98 92  Temp: 98.6 F (37 C)   97.7 F (36.5 C)  TempSrc: Oral   Oral  Resp: 18  18 20  Height:      Weight:    112.2 kg (247 lb 5.7 oz)  SpO2: 98%  98% 98%    Weight change: 1.477 kg (3 lb 4.1 oz) Filed Weights   01/17/14 1655 01/18/14 0455 01/19/14 0534  Weight: 108.3 kg (238 lb 12.1 oz) 110.8 kg (244 lb 4.3 oz) 112.2 kg (247 lb 5.7 oz)   Body mass index is 42.44 kg/(m^2).   Gen Exam: Awake and alert with clear speech.  Multiple complaints Neck: Supple, No JVD.   Chest: B/L Clear.  No W/C/R, no accessory muscle use. CVS: S1 S2 Regular, no murmurs.  Abdomen: obese, soft, BS +,  non tender, non distended.  Extremities: no edema, lower extremities warm to touch, no erythema in left foot. Neurologic: Non Focal.     Intake/Output from previous day:  Intake/Output Summary (Last 24 hours) at 01/19/14 1045 Last data filed at 01/18/14 2112  Gross per 24 hour  Intake    355 ml  Output      0 ml  Net    355 ml     LAB RESULTS: CBC  Recent Labs Lab 01/17/14 0018 01/17/14 0024 01/19/14 0715  WBC 13.3*  --  5.6  HGB 12.6 12.6 10.4*  HCT 36.7 37.0 30.4*  PLT 398  --  267  MCV 105.2*  --  105.9*  MCH 36.1*  --  36.2*  MCHC 34.3  --  34.2  RDW 13.9  --  13.7  LYMPHSABS 4.3*  --   --   MONOABS 1.3*  --   --   EOSABS 0.0  --   --   BASOSABS 0.0  --   --     Chemistries   Recent Labs Lab 01/17/14 0018 01/17/14 0024 01/18/14 1912 01/19/14 0715  NA 135* 131* 129* 131*  K 6.7* 6.6* 5.4* 5.6*  CL 89* 98 87* 87*  CO2 24  --  25 25  GLUCOSE 123* 119* 135* 96  BUN 40* 39* 28* 35*  CREATININE 8.37* 9.40* 6.85* 7.94*  CALCIUM 9.5  --  8.2* 8.3*     MICROBIOLOGY: Recent Results (from the past 240 hour(s))  MRSA PCR SCREENING     Status: None   Collection Time    01/17/14 12:39 PM      Result Value Ref Range Status   MRSA by PCR NEGATIVE  NEGATIVE Final   Comment:            The GeneXpert MRSA Assay (FDA     approved for NASAL specimens     only), is one component of a     comprehensive MRSA colonization     surveillance program. It is not     intended to diagnose MRSA     infection nor to guide or     monitor treatment for     MRSA infections.    RADIOLOGY STUDIES/RESULTS: Ct Abdomen Pelvis W Contrast  01/17/2014   CLINICAL DATA:  Abdominal pain.  EXAM: CT ABDOMEN AND PELVIS WITH CONTRAST  TECHNIQUE: Multidetector CT imaging of the abdomen and pelvis was performed using the standard protocol following bolus administration of intravenous contrast.  CONTRAST:  OMNIPAQUE IOHEXOL 300 MG/ML  SOLN  COMPARISON:  None.  FINDINGS: Visualized  lung bases appear normal. Diffuse sclerosis of the visualized skeleton is noted consistent with a history of end-stage renal disease. Multiple Schmorl's nodes are noted throughout the spine.  The liver, spleen and pancreas appear normal. No gallstones are noted.  Gallbladder does appear to be dilated. Severe atrophy of both kidneys is noted consistent with history of end-stage renal disease. Adrenal glands appear normal. Atherosclerotic calcifications of abdominal aorta are noted without aneurysm formation. The appendix appears normal. There is no evidence of bowel obstruction. No abnormal fluid collection is noted. Small calcified fibroid arises from the uterine fundus. Urinary bladder appears normal. Small fat containing right inguinal hernia is noted. Small fat containing  periumbilical hernia is noted.  No significant adenopathy is noted.  IMPRESSION: Bilateral renal atrophy is noted consistent with history of end-stage renal disease.  Distended gallbladder is noted, but no gallstones or biliary dilatation is noted.  Small calcified uterine fibroid.  Small fat containing periumbilical hernia.  Small fat containing right inguinal hernia.  No other abnormality seen in the abdomen or pelvis.   Electronically Signed   By: Roque LiasJames  Green M.D.   On: 01/17/2014 07:16    Conley CanalMarianne L York, PA-C [phone] [facility]   If 7PM-7AM, please contact night-coverage [email or web address] Password [email or web address] 01/19/2014, 10:45 AM   LOS: 3 days

## 2014-01-19 NOTE — Progress Notes (Signed)
Seen and examined,complains of mild lightheadedness, no headache. Non focal exam. Await social worker eval for placement. Renal following for HD.

## 2014-01-19 NOTE — Progress Notes (Signed)
Pt is refusing to take any meds  until she can eat something. Patient is refusing for nursing staff to take her routine vital signs or for RN to assess her. Pt has been educated. NP on call has been notified.

## 2014-01-20 DIAGNOSIS — F1210 Cannabis abuse, uncomplicated: Secondary | ICD-10-CM

## 2014-01-20 DIAGNOSIS — K2970 Gastritis, unspecified, without bleeding: Principal | ICD-10-CM

## 2014-01-20 DIAGNOSIS — Z21 Asymptomatic human immunodeficiency virus [HIV] infection status: Secondary | ICD-10-CM

## 2014-01-20 DIAGNOSIS — K2990 Gastroduodenitis, unspecified, without bleeding: Principal | ICD-10-CM

## 2014-01-20 DIAGNOSIS — R32 Unspecified urinary incontinence: Secondary | ICD-10-CM

## 2014-01-20 LAB — VITAMIN B12: VITAMIN B 12: 594 pg/mL (ref 211–911)

## 2014-01-20 LAB — HEPATITIS B SURFACE ANTIGEN: Hepatitis B Surface Ag: NEGATIVE

## 2014-01-20 MED ORDER — LORATADINE 10 MG PO TABS
10.0000 mg | ORAL_TABLET | Freq: Every day | ORAL | Status: DC
  Administered 2014-01-20 – 2014-01-21 (×2): 10 mg via ORAL
  Filled 2014-01-20 (×3): qty 1

## 2014-01-20 MED ORDER — BISACODYL 5 MG PO TBEC
5.0000 mg | DELAYED_RELEASE_TABLET | Freq: Every day | ORAL | Status: DC | PRN
  Administered 2014-01-20: 5 mg via ORAL
  Filled 2014-01-20: qty 1

## 2014-01-20 MED ORDER — DOXERCALCIFEROL 4 MCG/2ML IV SOLN
INTRAVENOUS | Status: AC
  Filled 2014-01-20: qty 2

## 2014-01-20 MED ORDER — FLEET ENEMA 7-19 GM/118ML RE ENEM: 1.0000 | ENEMA | Freq: Once | RECTAL | Status: DC

## 2014-01-20 MED ORDER — LORATADINE 10 MG PO TABS: 10.0000 mg | ORAL_TABLET | Freq: Every day | ORAL | Status: DC

## 2014-01-20 MED ORDER — OXYCODONE HCL 5 MG PO TABS: 5.0000 mg | ORAL_TABLET | Freq: Four times a day (QID) | ORAL | Status: DC | PRN

## 2014-01-20 MED ORDER — FLEET ENEMA 7-19 GM/118ML RE ENEM
1.0000 | ENEMA | Freq: Once | RECTAL | Status: DC
Start: 2014-01-20 — End: 2014-01-21
  Filled 2014-01-20: qty 1

## 2014-01-20 MED ORDER — SEVELAMER CARBONATE 800 MG PO TABS: 1600.0000 mg | ORAL_TABLET | Freq: Two times a day (BID) | ORAL | Status: DC | PRN

## 2014-01-20 MED ORDER — FLUTICASONE PROPIONATE 50 MCG/ACT NA SUSP
1.0000 | Freq: Every day | NASAL | Status: DC
  Filled 2014-01-20: qty 16

## 2014-01-20 MED ORDER — LORAZEPAM 0.5 MG PO TABS: 0.5000 mg | ORAL_TABLET | Freq: Four times a day (QID) | ORAL | Status: DC | PRN

## 2014-01-20 MED ORDER — FLUTICASONE PROPIONATE 50 MCG/ACT NA SUSP: 1.0000 | Freq: Every day | NASAL | Status: DC

## 2014-01-20 NOTE — Progress Notes (Signed)
RN spoke with Hospital doctorAmber, RN from 6E who stated that it would be ok for patient to have a plain [place] sandwich from [employer]. RN discussed with patient who stated that she would be ok with a [place] sandwich. Will continue to monitor patient per MD orders.

## 2014-01-20 NOTE — Discharge Summary (Addendum)
Physician Discharge Summary  [name] WUJ:[id number] DOB: [birth year] DOA: 01/16/2014  PCP: Bufford Spikes, DO  Admit date: 01/16/2014 Discharge date: 01/21/2014  Time spent: 45 minutes  No changes overnight.  Held d/c on 4/14 pending a hemo-dialysis chair.   Recommendations for Outpatient Follow-up:  1. Discharge to patient [facility]. 2. Scheduled HD Tues / Thurs / Sat.  Discharge Diagnoses:  Active Problems:   HIV infection   Hypertension   ESRD (end stage renal disease) on dialysis   Anemia secondary to renal failure   Crack cocaine use   Discharge Condition: stable  Diet recommendation: low sodium heart healthy  Filed Weights   01/19/14 0534 01/20/14 1304 01/20/14 1722  Weight: 112.2 kg (247 lb 5.7 oz) 114.9 kg (253 lb 4.9 oz) 112 kg (246 lb 14.6 oz)    History of present illness:   Jean, [name] is a 63 y.o female with a PMH of HIV, HTN, ESRD on HD.  She was discharged from [facility] on 11/17/2013. She is homeless and went [place] to stay with family. She then starting smoking crack cocaine that same evening.  She reports using crack about 5 times into the following day. She denies any trauma during this time. She has not eaten since Thursday morning.  She began having abdominal pain, vomiting, and diarrhea.   She called EMS who brought her to [facility] with complaints of pain in her abdomen,  No further vomiting or diarrhea in the ER.  She also complains of chronic pain in the right hip, top of her left foot and big toe which she reports and very severe.     Hospital Course:  Abdominal pain  - Likely secondary to gastritis from crack cocaine use  - abdominal pain completely resolved with supportive care  - Continue with as needed GI cocktail, Protonix  - CT scan of the abdomen on 01/17/14 and negative for acute abnormalities.  - Abdominal exam is very benign, suspect no further workup required at this time  -Exam on 01/20/2014 revealed no  tenderness, distention , normal BS throughout.  Patient reports no complaints or N/V  Vomiting  - Resolved with supportive care  - Advanced to a renal diet  -Advanced to regular diet on 01/20/2014 due to patient's insistance.  Hyperkalemia  - Secondary to missed dialysis  - Underwent dialysis on 4/11, and 4/14 -01/19/2014 K=-5.6  Hypertension  - controlled, continue Norvasc and Coreg   End-stage renal disease  - Neurology following, dialysis schedule is Tuesday, Thursdays and Saturdays   Anemia of chronic disease  - Defer to nephrology   HIV  - Continue antiretrovirals   Polysubstance use-cocaine/marijuana  - Counseled extensively   Homelessness  - Child psychotherapist evaluation, patient interested in pursuing SNF placement  -patient will be discharged to the SNF of her choosing.  Dizziness  -resolved -patient refused orthostatic bp checks.   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  NO acute intracranial findings.  Left great toe pain.  -Uric Acid is WNL  No erythema or swelling.  -On Voltaren gel   Disposition:  d/c to SNF when HD chair is arranged.   DVT Prophylaxis:  Prophylactic Heparin   Procedures:  Hemodialysis -01/17/2014, 4/14.  Consultations:  Nephrology - Dr. Maree Krabbe  Discharge Exam: Filed Vitals:   01/21/14 1313  BP: 143/85  Pulse: 94  Temp: 98.3 F (36.8 C)  Resp: 18   Gen  Exam: Awake and alert with clear speech. Obese, comfortable. Neck: Supple, No JVD.  Chest: B/L Clear. No W/C/R, no accessory muscle use.  CVS: S1 S2 Regular, no murmurs.  Abdomen: obese, soft, BS +, non tender, non distended.  Extremities: no edema, lower extremities warm to touch, no erythema in left foot.  Neurologic: Non Focal.       Physical Exam     Discharge Orders   Future Orders Complete By Expires   Diet - low sodium heart healthy  As directed    Increase activity slowly  As directed         Medication List    STOP taking these medications       cinacalcet 60 MG tablet  Commonly known as:  SENSIPAR      TAKE these medications       amitriptyline 50 MG tablet  Commonly known as:  ELAVIL  Take 50 mg by mouth at bedtime.     amLODipine 5 MG tablet  Commonly known as:  NORVASC  Take 1 tablet (5 mg total) by mouth at bedtime.     aspirin 81 MG EC tablet  Take 1 tablet (81 mg total) by mouth daily.     carvedilol 3.125 MG tablet  Commonly known as:  COREG  Take 1 tablet (3.125 mg total) by mouth 2 (two) times daily with a meal.     diclofenac sodium 1 % Gel  Commonly known as:  VOLTAREN  Apply 4 g topically 4 (four) times daily.     diphenhydrAMINE 50 MG capsule  Commonly known as:  BENADRYL  Take 1 capsule (50 mg total) by mouth at bedtime.     FLUoxetine 20 MG capsule  Commonly known as:  PROZAC  Take 1 capsule (20 mg total) by mouth daily.     fluticasone 50 MCG/ACT nasal spray  Commonly known as:  FLONASE  Place 1 spray into both nostrils daily.     HYDROcodone-acetaminophen 5-325 MG per tablet  Commonly known as:  NORCO/VICODIN  Take 1 tablet by mouth every 4 (four) hours as needed for moderate pain.     hydrOXYzine 25 MG tablet  Commonly known as:  ATARAX/VISTARIL  Take 1 tablet (25 mg total) by mouth 3 (three) times daily.     lamiVUDine 10 MG/ML solution  Commonly known as:  EPIVIR  Take 5 mLs (50 mg total) by mouth daily.     loratadine 10 MG tablet  Commonly known as:  CLARITIN  Take 1 tablet (10 mg total) by mouth daily.     LORazepam 0.5 MG tablet  Commonly known as:  ATIVAN  Take 1 tablet (0.5 mg total) by mouth every 6 (six) hours as needed (agitation).     multivitamin Tabs tablet  Take 1 tablet by mouth at bedtime.     omeprazole 20 MG capsule  Commonly known as:  PRILOSEC  Take 20 mg by mouth daily.     oxyCODONE 5 MG immediate release tablet  Commonly known as:  Oxy IR/ROXICODONE  Take 1 tablet (5 mg total) by mouth  every 6 (six) hours as needed for moderate pain.     polyethylene glycol packet  Commonly known as:  MIRALAX / GLYCOLAX  Take 17 g by mouth daily.     Protein Powd  Take 1 scoop by mouth 3 (three) times daily. Mixed with applesauce     raltegravir 400 MG tablet  Commonly known as:  ISENTRESS  Take 400 mg  by mouth 2 (two) times daily.     senna-docusate 8.6-50 MG per tablet  Commonly known as:  Senokot-S  Take 2 tablets by mouth daily.     sevelamer carbonate 800 MG tablet  Commonly known as:  RENVELA  Take 2 tablets (1,600 mg total) by mouth 2 (two) times daily as needed (with snacks for high phosphorus).     tenofovir 300 MG tablet  Commonly known as:  VIREAD  Take 300 mg by mouth every Friday. Takes on Fridays.       Allergies  Allergen Reactions  . Tramadol     "Gives me bad dreams"  . Morphine And Related Hives and Rash      The results of significant diagnostics from this hospitalization (including imaging, microbiology, ancillary and laboratory) are listed below for reference.    Significant Diagnostic Studies: Ct Head Wo Contrast  01/19/2014   CLINICAL DATA:  Lightheadedness with dizziness. HIV infection. Morbid obesity. Hypertension.  EXAM: CT HEAD WITHOUT CONTRAST  TECHNIQUE: Contiguous axial images were obtained from the base of the skull through the vertex without contrast.  COMPARISON:  12/11/2012  FINDINGS: Moderate cerebral and cerebellar atrophy. Hypoattenuation of the white matter has progressed from 2014, and is nonspecific in a patient with HIV infection. Chronic microvascular ischemic change could as appears but HIV encephalopathy is indistinguishable. No features to suggest focal areas of opportunistic infection such as toxoplasmosis or cryptococcal infection. Scattered remote lacunar infarcts. No acute stroke or hemorrhage. No mass lesion or hydrocephalus. No extra-axial fluid. Calvarium intact. Advanced vascular calcification. No acute sinus or mastoid  disease. There has been some progression of atrophy and small vessel disease per 2014.  IMPRESSION: Progression of atrophy and small vessel disease. Hypoattenuation of the white matter could represent chronic microvascular ischemic change or HIV encephalopathy. No acute intracranial findings.   Electronically Signed   By: Davonna Belling M.D.   On: 01/19/2014 20:05   Ct Abdomen Pelvis W Contrast  01/17/2014   CLINICAL DATA:  Abdominal pain.  EXAM: CT ABDOMEN AND PELVIS WITH CONTRAST  TECHNIQUE: Multidetector CT imaging of the abdomen and pelvis was performed using the standard protocol following bolus administration of intravenous contrast.  CONTRAST:  OMNIPAQUE IOHEXOL 300 MG/ML  SOLN  COMPARISON:  None.  FINDINGS: Visualized lung bases appear normal. Diffuse sclerosis of the visualized skeleton is noted consistent with a history of end-stage renal disease. Multiple Schmorl's nodes are noted throughout the spine.  The liver, spleen and pancreas appear normal. No gallstones are noted. Gallbladder does appear to be dilated. Severe atrophy of both kidneys is noted consistent with history of end-stage renal disease. Adrenal glands appear normal. Atherosclerotic calcifications of abdominal aorta are noted without aneurysm formation. The appendix appears normal. There is no evidence of bowel obstruction. No abnormal fluid collection is noted. Small calcified fibroid arises from the uterine fundus. Urinary bladder appears normal. Small fat containing right inguinal hernia is noted. Small fat containing  periumbilical hernia is noted.  No significant adenopathy is noted.  IMPRESSION: Bilateral renal atrophy is noted consistent with history of end-stage renal disease.  Distended gallbladder is noted, but no gallstones or biliary dilatation is noted.  Small calcified uterine fibroid.  Small fat containing periumbilical hernia.  Small fat containing right inguinal hernia.  No other abnormality seen in the abdomen or  pelvis.   Electronically Signed   By: Roque Lias M.D.   On: 01/17/2014 07:16   Dg Chest Portable 1 View  01/06/2014  CLINICAL DATA:  Short of breath, chest pain  EXAM: PORTABLE CHEST - 1 VIEW  COMPARISON:  Prior chest x-ray 11/08/2013  FINDINGS: Diffuse bilateral interstitial and airspace opacities most consistent with interstitial pulmonary edema. Linear atelectasis in the left mid lung. Stable cardiac and mediastinal contours. No definite effusion. No pneumothorax. No focal airspace consolidation. No acute osseous abnormality.  IMPRESSION: Moderate interstitial pulmonary edema.  Left mid lung subsegmental atelectasis.   Electronically Signed   By: Malachy MoanHeath  McCullough M.D.   On: 01/06/2014 02:48    Microbiology: Recent Results (from the past 240 hour(s))  MRSA PCR SCREENING     Status: None   Collection Time    01/17/14 12:39 PM      Result Value Ref Range Status   MRSA by PCR NEGATIVE  NEGATIVE Final   Comment:            The GeneXpert MRSA Assay (FDA     approved for NASAL specimens     only), is one component of a     comprehensive MRSA colonization     surveillance program. It is not     intended to diagnose MRSA     infection nor to guide or     monitor treatment for     MRSA infections.     Labs: Basic Metabolic Panel:  Recent Labs Lab 01/17/14 0018 01/17/14 0024 01/18/14 1912 01/19/14 0715 01/21/14 1305  NA 135* 131* 129* 131* 130*  K 6.7* 6.6* 5.4* 5.6* 5.2  CL 89* 98 87* 87* 88*  CO2 24  --  25 25 25   GLUCOSE 123* 119* 135* 96 123*  BUN 40* 39* 28* 35* 29*  CREATININE 8.37* 9.40* 6.85* 7.94* 6.71*  CALCIUM 9.5  --  8.2* 8.3* 8.4  PHOS  --   --   --  3.3  --    Liver Function Tests:  Recent Labs Lab 01/17/14 0018 01/19/14 0715  AST 19  --   ALT 12  --   ALKPHOS 252*  --   BILITOT 0.4  --   PROT 8.7*  --   ALBUMIN 4.5 3.6    Recent Labs Lab 01/17/14 0018  LIPASE 36   CBC:  Recent Labs Lab 01/17/14 0018 01/17/14 0024 01/19/14 0715  WBC  13.3*  --  5.6  NEUTROABS 7.7  --   --   HGB 12.6 12.6 10.4*  HCT 36.7 37.0 30.4*  MCV 105.2*  --  105.9*  PLT 398  --  267   BNP: BNP (last 3 results)  Recent Labs  10/06/13 0334 10/21/13 2037 11/08/13 1903  PROBNP 31834.0* 40981.123932.0* 91478.242892.0*     Signed:   Conley CanalMarianne L York, PA-C [phone] [facility] 01/21/2014, 2:30 PM     Addendum  Patient seen and examined, chart and data base reviewed.  I agree with the above assessment and plan.  For full details please see Mrs. Algis DownsMarianne York PA note.   Clint LippsMutaz A Jerney Baksh, MD [facility] Pager: [phone] 01/21/2014, 5:57 PM

## 2014-01-20 NOTE — Clinical Social Work Note (Signed)
CSW met with patient in HD this afternoon. Patient accepts bed offer at [facility] in [place]. CSW explained that patient will be discharged once patient's outpatient HD has been set up by HD unit.  Liz Beach MSW, [place], [place], [phone]

## 2014-01-20 NOTE — Progress Notes (Signed)
01/20/2014 11:33 AM Hemodialysis Outpatient Note; contacted by Algis DownsMarianne York regarding this patient and getting her outpatient dialysis location moved to a facility closest to [facility]. The closest [facility] facility is [place] on [address] I have initiated the CLIP process. Thank you. Tilman NeatJudith H Alanie Syler

## 2014-01-20 NOTE — Care Management Note (Signed)
    Page 1 of 1   01/20/2014     11:54:18 AM   CARE MANAGEMENT NOTE 01/20/2014  Patient:  [name],[name]   Account Number:  [id number]  Date Initiated:  01/20/2014  Documentation initiated by:  Letha CapeAYLOR,Olegario Emberson  Subjective/Objective Assessment:   dx hyperkalemia  admit-homeless     Action/Plan:   Anticipated DC Date:  01/20/2014   Anticipated DC Plan:  SKILLED NURSING FACILITY  In-house referral  Clinical Social Worker      DC Planning Services  CM consult      Choice offered to / List presented to:             Status of service:  Completed, signed off Medicare Important Message given?   (If response is "NO", the following Medicare IM given date fields will be blank) Date Medicare IM given:   Date Additional Medicare IM given:    Discharge Disposition:  SKILLED NURSING FACILITY  Per UR Regulation:  Reviewed for med. necessity/level of care/duration of stay  If discussed at Long Length of Stay Meetings, dates discussed:    Comments:

## 2014-01-20 NOTE — Procedures (Signed)
Pt seen on HD.  Ap 210 Vp 160  SBP 125.  Tolerating HD well so far.  She is aware of need for SNF.

## 2014-01-20 NOTE — Progress Notes (Addendum)
LATE ENTRY  Clinical Social Work Department CLINICAL SOCIAL WORK PLACEMENT NOTE 01/20/2014  Patient:  [name],[name]  Account Number:  [id number] Admit date:  01/16/2014  Clinical Social Worker:  Maryclare LabradorJULIE Leontyne Manville, Theresia MajorsLCSWA  Date/time:  01/19/2014 08:56 AM  Clinical Social Work is seeking post-discharge placement for this patient at the following level of care:   SKILLED NURSING   (*CSW will update this form in Epic as items are completed)   N/A--referrals made to all SNFs in [place] and [place] [place], and all ALFs in [place] Patient/family provided with [facility] Department of Clinical Social Work's list of facilities offering this level of care within the geographic area requested by the patient (or if unable, by the patient's family).  01/19/2014  Patient/family informed of their freedom to choose among providers that offer the needed level of care, that participate in Medicare, Medicaid or managed care program needed by the patient, have an available bed and are willing to accept the patient.  N/A--clinicals not sent to this facility  Patient/family informed of [facility]' ownership interest in [facility], as well as of the fact that they are under no obligation to receive care at this facility.  PASARR submitted to EDS on EXISTING PASARR number received from EDS on EXISTING  FL2 transmitted to all facilities in geographic area requested by pt/family on  01/19/2014 FL2 transmitted to all facilities within larger geographic area on   Patient informed that his/her managed care company has contracts with or will negotiate with  certain facilities, including the following:     Patient/family informed of bed offers received:   Patient chooses bed at  Physician recommends and patient chooses bed at    Patient to be transferred to  on   Patient to be transferred to facility by   The following physician request were entered in Epic:   Additional  Comments:   Maryclare LabradorJulie Eun Vermeer, MSW, [facility]WA Clinical Social Worker [phone]

## 2014-01-21 DIAGNOSIS — D631 Anemia in chronic kidney disease: Secondary | ICD-10-CM

## 2014-01-21 DIAGNOSIS — F411 Generalized anxiety disorder: Secondary | ICD-10-CM

## 2014-01-21 DIAGNOSIS — N039 Chronic nephritic syndrome with unspecified morphologic changes: Secondary | ICD-10-CM

## 2014-01-21 LAB — BASIC METABOLIC PANEL
BUN: 29 mg/dL — AB (ref 6–23)
CALCIUM: 8.4 mg/dL (ref 8.4–10.5)
CO2: 25 meq/L (ref 19–32)
CREATININE: 6.71 mg/dL — AB (ref 0.50–1.10)
Chloride: 88 mEq/L — ABNORMAL LOW (ref 96–112)
GFR calc Af Amer: 7 mL/min — ABNORMAL LOW (ref >90)
GFR, EST NON AFRICAN AMERICAN: 6 mL/min — AB (ref >90)
GLUCOSE: 123 mg/dL — AB (ref 70–99)
Potassium: 5.2 mEq/L (ref 3.7–5.3)
SODIUM: 130 meq/L — AB (ref 137–147)

## 2014-01-21 LAB — FOLATE RBC: RBC Folate: 1186 ng/mL — ABNORMAL HIGH (ref >280)

## 2014-01-21 MED ORDER — RENA-VITE PO TABS
1.0000 | ORAL_TABLET | Freq: Every day | ORAL | Status: DC
  Filled 2014-01-21: qty 1

## 2014-01-21 MED ORDER — POLYETHYLENE GLYCOL 3350 17 G PO PACK
17.0000 g | PACK | Freq: Every day | ORAL | Status: DC
  Administered 2014-01-21: 17 g via ORAL
  Filled 2014-01-21: qty 1

## 2014-01-21 NOTE — Progress Notes (Signed)
PATIENT DETAILS Name: Jean CooleyMargaret Ann [name] Age: 63 y.o. Sex: female Date of Birth: 16-Aug-1951 Admit Date: 01/16/2014 Admitting Physician Calvert CantorSaima Rizwan, MD NGE:XBMWPCP:REED, TIFFANY, DO  Subjective: Complains that she has a rush go to her head when she moves it.  Also concerned that she is going to a new SNF and [facility] at [facility].  Assessment/Plan: Principal Problem:    Abdominal pain - Likely secondary to gastritis- abdominal pain completely resolved with supportive care - Continue with as needed GI cocktail, Protonix - CT scan of the abdomen on 01/17/14 and negative for acute abnormalities.  - Abdominal exam is very benign, suspect no further workup required at this time  Active Problems:  Vomiting - Resolved with supportive care - Advanced to a renal diet  Hyperkalemia - resolved. - Secondary to missed dialysis - Underwent dialysis on 4/11, 4/14.    Hypertension - controlled, continue Norvasc and Coreg  End-stage renal disease - Neurology following, dialysis schedule is Tuesday, Thursdays and Saturdays  Anemia of chronic disease - Defer to nephrology  HIV - Continue antiretrovirals  Polysubstance use-cocaine/marijuana - Counseled extensively  [facility]elessness - [facility]  Dizziness -Will check orthostatics - patient refuses Orthostatics.  BP appears stable. -PT evaluation - refused.  Left great toe pain. -Check uric acid level.  No erythema or swelling. -On Voltaren gel - no further complaints.  Disposition: Medically cleared for d/c to SNF when HD chair arranged.  DVT Prophylaxis: Prophylactic Heparin   Code Status: Full code   Family Communication None at bedside  Procedures:  None  CONSULTS:  nephrology  Time spent 40 minutes-which includes 50% of the time with face-to-face with patient/ family and coordinating care related to the above assessment and plan.    MEDICATIONS: Scheduled Meds: .  amitriptyline  50 mg Oral QHS  . amLODipine  5 mg Oral QHS  . carvedilol  3.125 mg Oral BID WC  . diclofenac sodium  4 g Topical QID  . diphenhydrAMINE  50 mg Oral QHS  . doxercalciferol  2 mcg Intravenous Q T,Th,Sa-HD  . FLUoxetine  20 mg Oral Daily  . fluticasone  1 spray Each Nare Daily  . heparin  5,000 Units Subcutaneous 3 times per day  . lamiVUDine  50 mg Oral Daily  . loratadine  10 mg Oral Daily  . pantoprazole  40 mg Oral Daily  . raltegravir  400 mg Oral BID  . sevelamer carbonate  1,600 mg Oral TID WC  . sodium chloride  250 mL Intravenous Once  . sodium phosphate  1 enema Rectal Once  . [START ON 01/23/2014] tenofovir  300 mg Oral Q Fri   Continuous Infusions:  PRN Meds:.bisacodyl, fentaNYL, gi cocktail, LORazepam, oxyCODONE, sevelamer carbonate  Antibiotics: Anti-infectives   Start     Dose/Rate Route Frequency Ordered Stop   01/23/14 1000  tenofovir (VIREAD) tablet 300 mg     300 mg Oral Every Fri 01/17/14 1117     01/17/14 1130  lamiVUDine (EPIVIR) 10 MG/ML solution 50 mg     50 mg Oral Daily 01/17/14 1117     01/17/14 1130  raltegravir (ISENTRESS) tablet 400 mg     400 mg Oral 2 times daily 01/17/14 1117         PHYSICAL EXAM: Vital signs in last 24 hours: Filed Vitals:   01/20/14 1810 01/20/14 2001 01/20/14 2109 01/21/14 0330  BP: 135/86 117/78 131/83 139/78  Pulse: 111 102 101 100  Temp:   98.3 F (36.8 C) 98.7 F (37.1 C)  TempSrc:   Oral Oral  Resp:   18 18  Height:      Weight:      SpO2:   95% 97%    Weight change:  Filed Weights   01/19/14 0534 01/20/14 1304 01/20/14 1722  Weight: 112.2 kg (247 lb 5.7 oz) 114.9 kg (253 lb 4.9 oz) 112 kg (246 lb 14.6 oz)   Body mass index is 42.36 kg/(m^2).   Gen Exam: Awake and alert with clear speech.  Lying comfortably in bed. Neck: Supple, No JVD.   Chest: B/L Clear.  No W/C/R, no accessory muscle use. CVS: S1 S2 Regular, no murmurs.  Abdomen: obese, soft, BS +, non tender, non distended.    Extremities: no edema, lower extremities warm to touch, 5/5 strength in each. Neurologic: Non Focal.     Intake/Output from previous day:  Intake/Output Summary (Last 24 hours) at 01/21/14 1029 Last data filed at 01/21/14 0941  Gross per 24 hour  Intake    240 ml  Output   3000 ml  Net  -2760 ml     LAB RESULTS: CBC  Recent Labs Lab 01/17/14 0018 01/17/14 0024 01/19/14 0715  WBC 13.3*  --  5.6  HGB 12.6 12.6 10.4*  HCT 36.7 37.0 30.4*  PLT 398  --  267  MCV 105.2*  --  105.9*  MCH 36.1*  --  36.2*  MCHC 34.3  --  34.2  RDW 13.9  --  13.7  LYMPHSABS 4.3*  --   --   MONOABS 1.3*  --   --   EOSABS 0.0  --   --   BASOSABS 0.0  --   --     Chemistries   Recent Labs Lab 01/17/14 0018 01/17/14 0024 01/18/14 1912 01/19/14 0715  NA 135* 131* 129* 131*  K 6.7* 6.6* 5.4* 5.6*  CL 89* 98 87* 87*  CO2 24  --  25 25  GLUCOSE 123* 119* 135* 96  BUN 40* 39* 28* 35*  CREATININE 8.37* 9.40* 6.85* 7.94*  CALCIUM 9.5  --  8.2* 8.3*     MICROBIOLOGY: Recent Results (from the past 240 hour(s))  MRSA PCR SCREENING     Status: None   Collection Time    01/17/14 12:39 PM      Result Value Ref Range Status   MRSA by PCR NEGATIVE  NEGATIVE Final   Comment:            The GeneXpert MRSA Assay (FDA     approved for NASAL specimens     only), is one component of a     comprehensive MRSA colonization     surveillance program. It is not     intended to diagnose MRSA     infection nor to guide or     monitor treatment for     MRSA infections.    RADIOLOGY STUDIES/RESULTS: Ct Abdomen Pelvis W Contrast  01/17/2014   CLINICAL DATA:  Abdominal pain.  EXAM: CT ABDOMEN AND PELVIS WITH CONTRAST  TECHNIQUE: Multidetector CT imaging of the abdomen and pelvis was performed using the standard protocol following bolus administration of intravenous contrast.  CONTRAST:  100mL OMNIPAQUE IOHEXOL 300 MG/ML  SOLN  COMPARISON:  None.  FINDINGS: Visualized lung bases appear normal. Diffuse  sclerosis of the visualized skeleton is noted consistent with a history of end-stage renal disease. Multiple Schmorl's nodes  are noted throughout the spine.  The liver, spleen and pancreas appear normal. No gallstones are noted. Gallbladder does appear to be dilated. Severe atrophy of both kidneys is noted consistent with history of end-stage renal disease. Adrenal glands appear normal. Atherosclerotic calcifications of abdominal aorta are noted without aneurysm formation. The appendix appears normal. There is no evidence of bowel obstruction. No abnormal fluid collection is noted. Small calcified fibroid arises from the uterine fundus. Urinary bladder appears normal. Small fat containing right inguinal hernia is noted. Small fat containing  periumbilical hernia is noted.  No significant adenopathy is noted.  IMPRESSION: Bilateral renal atrophy is noted consistent with history of end-stage renal disease.  Distended gallbladder is noted, but no gallstones or biliary dilatation is noted.  Small calcified uterine fibroid.  Small fat containing periumbilical hernia.  Small fat containing right inguinal hernia.  No other abnormality seen in the abdomen or pelvis.   Electronically Signed   By: Roque Lias M.D.   On: 01/17/2014 07:16    Conley Canal [phone] [facility]   If 7PM-7AM, please contact night-coverage [email or web address] Password [email or web address] 01/21/2014, 10:29 AM   LOS: 5 days     Addendum  Patient seen and examined, chart and data base reviewed.  I agree with the above assessment and plan.  For full details please see Mrs. Algis Downs PA note.   Clint Lipps, MD [facility] Pager: [phone] 01/21/2014, 5:58 PM

## 2014-01-21 NOTE — Progress Notes (Signed)
DC by ambulance at this time 

## 2014-01-21 NOTE — Progress Notes (Signed)
[facility]

## 2014-01-21 NOTE — Clinical Social Work Placement (Signed)
Clinical Social Work Department CLINICAL SOCIAL WORK PLACEMENT NOTE 01/21/2014  Patient:  [name],[name]  Account Number:  [id number] Admit date:  01/16/2014  Clinical Social Worker:  Maryclare LabradorJULIE ANDERSON, Theresia MajorsLCSWA  Date/time:  01/20/2014 08:56 AM  Clinical Social Work is seeking post-discharge placement for this patient at the following level of care:   SKILLED NURSING   (*CSW will update this form in Epic as items are completed)     Patient/family provided with [facility] Department of Clinical Social Work's list of facilities offering this level of care within the geographic area requested by the patient (or if unable, by the patient's family).  01/19/2014  Patient/family informed of their freedom to choose among providers that offer the needed level of care, that participate in Medicare, Medicaid or managed care program needed by the patient, have an available bed and are willing to accept the patient.  01/19/2014  Patient/family informed of [facility]' ownership interest in [facility], as well as of the fact that they are under no obligation to receive care at this facility.  PASARR submitted to EDS on  PASARR number received from EDS on   FL2 transmitted to all facilities in geographic area requested by pt/family on  01/19/2014 FL2 transmitted to all facilities within larger geographic area on   Patient informed that his/her managed care company has contracts with or will negotiate with  certain facilities, including the following:     Patient/family informed of bed offers received:  01/20/2014 Patient [facility] bed at [facility] Physician recommends and patient chooses bed at    Patient to be [facility] on  01/21/2014 Patient to be transferred to facility by ambulance  The following physician request were entered in Epic:   Additional Comments: Per MD patient ready to [facility]. RN, patient, and facility  aware of DC. RN given number for report. DC packet on chart. AMbulance transport requested for patient. CSW signing off at this time.    Roddie McBryant Nataline Basara MSW, [place]CSWA, [place], [phone]

## 2014-01-21 NOTE — Progress Notes (Signed)
01/21/2014 2:11 PM Hemodialysis Outpatient Note; this patient has been accepted at the [facility] ( [address], [place] [address]) on a Tuesday, Thursday and Saturday 2nd shift schedule. The center can begin treatment Thursday January 22, 2014, and the patient needs to arrive at the center at 1045 AM to sign paperwork and consents. Thank you. Tilman NeatJudith H Abella Shugart

## 2014-01-21 NOTE — Clinical Social Work Note (Signed)
Patient refuses to be transported to facility without her clothing which is at [facility]. Patient will not call her family to pick up clothing and states, "If you send me to the facility I will not go to my dialysis appointment tomorrow because I won't have clothes to wear." CSW contacted facility and was able to get one of the social workers to bring patient's clothing to hospital at 5:30 pm. CSW gave patient clothing and set up ambulance transport to facility. CSW signing off at this time.   Roddie McBryant Shanedra Lave MSW, [place]irie[place]CSWA, [place], [phone]

## 2014-01-21 NOTE — Progress Notes (Signed)
S:  Co constipation O:BP 139/78  Pulse 100  Temp(Src) 98.7 F (37.1 C) (Oral)  Resp 18  Ht 5\' 4"  (1.626 m)  Wt 112 kg (246 lb 14.6 oz)  BMI 42.36 kg/m2  SpO2 97%  Intake/Output Summary (Last 24 hours) at 01/21/14 1110 Last data filed at 01/21/14 0941  Gross per 24 hour  Intake    240 ml  Output   3000 ml  Net  -2760 ml   Weight change:  ZOX:WRUEAGen:awake and alert CVS:RRR Resp:Clear Abd:+ BS NTND Ext:no edema  L AVF + bruit NEURO:CNI M&SI Ox3 no asterixis    . amitriptyline  50 mg Oral QHS  . amLODipine  5 mg Oral QHS  . carvedilol  3.125 mg Oral BID WC  . diclofenac sodium  4 g Topical QID  . diphenhydrAMINE  50 mg Oral QHS  . doxercalciferol  2 mcg Intravenous Q T,Th,Sa-HD  . FLUoxetine  20 mg Oral Daily  . fluticasone  1 spray Each Nare Daily  . heparin  5,000 Units Subcutaneous 3 times per day  . lamiVUDine  50 mg Oral Daily  . loratadine  10 mg Oral Daily  . multivitamin  1 tablet Oral QHS  . pantoprazole  40 mg Oral Daily  . polyethylene glycol  17 g Oral Daily  . raltegravir  400 mg Oral BID  . sevelamer carbonate  1,600 mg Oral TID WC  . sodium chloride  250 mL Intravenous Once  . [START ON 01/23/2014] tenofovir  300 mg Oral Q Fri   Ct Head Wo Contrast  01/19/2014   CLINICAL DATA:  Lightheadedness with dizziness. HIV infection. Morbid obesity. Hypertension.  EXAM: CT HEAD WITHOUT CONTRAST  TECHNIQUE: Contiguous axial images were obtained from the base of the skull through the vertex without contrast.  COMPARISON:  12/11/2012  FINDINGS: Moderate cerebral and cerebellar atrophy. Hypoattenuation of the white matter has progressed from 2014, and is nonspecific in a patient with HIV infection. Chronic microvascular ischemic change could as appears but HIV encephalopathy is indistinguishable. No features to suggest focal areas of opportunistic infection such as toxoplasmosis or cryptococcal infection. Scattered remote lacunar infarcts. No acute stroke or hemorrhage. No mass  lesion or hydrocephalus. No extra-axial fluid. Calvarium intact. Advanced vascular calcification. No acute sinus or mastoid disease. There has been some progression of atrophy and small vessel disease per 2014.  IMPRESSION: Progression of atrophy and small vessel disease. Hypoattenuation of the white matter could represent chronic microvascular ischemic change or HIV encephalopathy. No acute intracranial findings.   Electronically Signed   By: Davonna BellingJohn  Curnes M.D.   On: 01/19/2014 20:05   BMET    Component Value Date/Time   NA 131* 01/19/2014 0715   K 5.6* 01/19/2014 0715   CL 87* 01/19/2014 0715   CO2 25 01/19/2014 0715   GLUCOSE 96 01/19/2014 0715   BUN 35* 01/19/2014 0715   CREATININE 7.94* 01/19/2014 0715   CALCIUM 8.3* 01/19/2014 0715   CALCIUM 8.8 11/18/2013 0701   GFRNONAA 5* 01/19/2014 0715   GFRAA 6* 01/19/2014 0715   CBC    Component Value Date/Time   WBC 5.6 01/19/2014 0715   RBC 2.87* 01/19/2014 0715   RBC 2.87* 05/11/2012 1228   HGB 10.4* 01/19/2014 0715   HCT 30.4* 01/19/2014 0715   PLT 267 01/19/2014 0715   MCV 105.9* 01/19/2014 0715   MCH 36.2* 01/19/2014 0715   MCHC 34.2 01/19/2014 0715   RDW 13.7 01/19/2014 0715   LYMPHSABS 4.3* 01/17/2014 0018  MONOABS 1.3* 01/17/2014 0018   EOSABS 0.0 01/17/2014 0018   BASOSABS 0.0 01/17/2014 0018     Assessment: 1. Hyperkalemia, resolved 2. ESRD 3. HIV 4. Anemia with high MCV  Check B12 Nl. Folate pending 5. HTN 6. Drug abuse 7. Homeless  Plan: 1.Miralax now 2.  She has been accepted at a [place] in [place].  Working on outpt spot for that unit.  Once arranged, she can be DC'd  Dyke MaesMichael T Riya Huxford

## 2014-01-31 ENCOUNTER — Inpatient Hospital Stay: Payer: Self-pay | Admitting: Internal Medicine

## 2014-01-31 LAB — BASIC METABOLIC PANEL
Anion Gap: 6 — ABNORMAL LOW (ref 7–16)
BUN: 54 mg/dL — ABNORMAL HIGH (ref 7–18)
CO2: 30 mmol/L (ref 21–32)
CREATININE: 10.28 mg/dL — AB (ref 0.60–1.30)
Calcium, Total: 9.1 mg/dL (ref 8.5–10.1)
Chloride: 89 mmol/L — ABNORMAL LOW (ref 98–107)
EGFR (African American): 4 — ABNORMAL LOW
GFR CALC NON AF AMER: 4 — AB
Glucose: 142 mg/dL — ABNORMAL HIGH (ref 65–99)
Osmolality: 269 (ref 275–301)
Potassium: 6.6 mmol/L (ref 3.5–5.1)
Sodium: 125 mmol/L — ABNORMAL LOW (ref 136–145)

## 2014-01-31 LAB — COMPREHENSIVE METABOLIC PANEL
ALBUMIN: 3.4 g/dL (ref 3.4–5.0)
ALT: 10 U/L — AB (ref 12–78)
ANION GAP: 8 (ref 7–16)
Alkaline Phosphatase: 236 U/L — ABNORMAL HIGH
BUN: 56 mg/dL — ABNORMAL HIGH (ref 7–18)
Bilirubin,Total: 0.7 mg/dL (ref 0.2–1.0)
CHLORIDE: 90 mmol/L — AB (ref 98–107)
CO2: 28 mmol/L (ref 21–32)
Calcium, Total: 9.6 mg/dL (ref 8.5–10.1)
Creatinine: 10.78 mg/dL — ABNORMAL HIGH (ref 0.60–1.30)
EGFR (African American): 4 — ABNORMAL LOW
GFR CALC NON AF AMER: 3 — AB
GLUCOSE: 132 mg/dL — AB (ref 65–99)
OSMOLALITY: 271 (ref 275–301)
Potassium: 6.6 mmol/L (ref 3.5–5.1)
SGOT(AST): 8 U/L — ABNORMAL LOW (ref 15–37)
Sodium: 126 mmol/L — ABNORMAL LOW (ref 136–145)
Total Protein: 8.6 g/dL — ABNORMAL HIGH (ref 6.4–8.2)

## 2014-01-31 LAB — CBC
HCT: 32 % — AB (ref 35.0–47.0)
HGB: 10.5 g/dL — ABNORMAL LOW (ref 12.0–16.0)
MCH: 35 pg — ABNORMAL HIGH (ref 26.0–34.0)
MCHC: 32.9 g/dL (ref 32.0–36.0)
MCV: 106 fL — ABNORMAL HIGH (ref 80–100)
PLATELETS: 392 10*3/uL (ref 150–440)
RBC: 3.02 10*6/uL — ABNORMAL LOW (ref 3.80–5.20)
RDW: 14.2 % (ref 11.5–14.5)
WBC: 29.6 10*3/uL — ABNORMAL HIGH (ref 3.6–11.0)

## 2014-01-31 LAB — CK TOTAL AND CKMB (NOT AT ARMC)
CK, Total: 30 U/L
CK-MB: <0.5 ng/mL — ABNORMAL LOW (ref 0.5–3.6)

## 2014-01-31 LAB — TROPONIN I

## 2014-01-31 LAB — POTASSIUM: POTASSIUM: 4.5 mmol/L (ref 3.5–5.1)

## 2014-02-01 LAB — CBC WITH DIFFERENTIAL/PLATELET
Basophil #: 0 10*3/uL (ref 0.0–0.1)
Basophil %: 0.1 %
EOS ABS: 0 10*3/uL (ref 0.0–0.7)
Eosinophil %: 0 %
HCT: 27.5 % — ABNORMAL LOW (ref 35.0–47.0)
HGB: 9.4 g/dL — ABNORMAL LOW (ref 12.0–16.0)
LYMPHS PCT: 3 %
Lymphocyte #: 0.8 10*3/uL — ABNORMAL LOW (ref 1.0–3.6)
MCH: 36.7 pg — ABNORMAL HIGH (ref 26.0–34.0)
MCHC: 34 g/dL (ref 32.0–36.0)
MCV: 108 fL — AB (ref 80–100)
Monocyte #: 1.1 x10 3/mm — ABNORMAL HIGH (ref 0.2–0.9)
Monocyte %: 4.3 %
NEUTROS PCT: 92.6 %
Neutrophil #: 24.2 10*3/uL — ABNORMAL HIGH (ref 1.4–6.5)
Platelet: 319 10*3/uL (ref 150–440)
RBC: 2.55 10*6/uL — ABNORMAL LOW (ref 3.80–5.20)
RDW: 14.1 % (ref 11.5–14.5)
WBC: 26.2 10*3/uL — AB (ref 3.6–11.0)

## 2014-02-01 LAB — COMPREHENSIVE METABOLIC PANEL
ALBUMIN: 2.8 g/dL — AB (ref 3.4–5.0)
Alkaline Phosphatase: 224 U/L — ABNORMAL HIGH
Anion Gap: 6 — ABNORMAL LOW (ref 7–16)
BUN: 40 mg/dL — AB (ref 7–18)
Bilirubin,Total: 0.5 mg/dL (ref 0.2–1.0)
CALCIUM: 9 mg/dL (ref 8.5–10.1)
CO2: 32 mmol/L (ref 21–32)
Chloride: 93 mmol/L — ABNORMAL LOW (ref 98–107)
Creatinine: 7.34 mg/dL — ABNORMAL HIGH (ref 0.60–1.30)
GFR CALC AF AMER: 6 — AB
GFR CALC NON AF AMER: 5 — AB
Glucose: 212 mg/dL — ABNORMAL HIGH (ref 65–99)
Osmolality: 279 (ref 275–301)
POTASSIUM: 5.4 mmol/L — AB (ref 3.5–5.1)
SGOT(AST): 10 U/L — ABNORMAL LOW (ref 15–37)
SGPT (ALT): 10 U/L — ABNORMAL LOW (ref 12–78)
SODIUM: 131 mmol/L — AB (ref 136–145)
Total Protein: 7.7 g/dL (ref 6.4–8.2)

## 2014-02-02 LAB — BASIC METABOLIC PANEL
Anion Gap: 9 (ref 7–16)
BUN: 74 mg/dL — AB (ref 7–18)
CREATININE: 9.47 mg/dL — AB (ref 0.60–1.30)
Calcium, Total: 8.8 mg/dL (ref 8.5–10.1)
Chloride: 89 mmol/L — ABNORMAL LOW (ref 98–107)
Co2: 31 mmol/L (ref 21–32)
EGFR (African American): 5 — ABNORMAL LOW
GFR CALC NON AF AMER: 4 — AB
GLUCOSE: 416 mg/dL — AB (ref 65–99)
Osmolality: 298 (ref 275–301)
POTASSIUM: 5.6 mmol/L — AB (ref 3.5–5.1)
Sodium: 129 mmol/L — ABNORMAL LOW (ref 136–145)

## 2014-02-02 LAB — RENAL FUNCTION PANEL
Albumin: 2.9 g/dL — ABNORMAL LOW (ref 3.4–5.0)
Anion Gap: 9 (ref 7–16)
BUN: 75 mg/dL — ABNORMAL HIGH (ref 7–18)
CALCIUM: 8.7 mg/dL (ref 8.5–10.1)
CO2: 31 mmol/L (ref 21–32)
CREATININE: 9.44 mg/dL — AB (ref 0.60–1.30)
Chloride: 89 mmol/L — ABNORMAL LOW (ref 98–107)
EGFR (African American): 5 — ABNORMAL LOW
EGFR (Non-African Amer.): 4 — ABNORMAL LOW
GLUCOSE: 414 mg/dL — AB (ref 65–99)
Osmolality: 299 (ref 275–301)
PHOSPHORUS: 3.4 mg/dL (ref 2.5–4.9)
POTASSIUM: 5.5 mmol/L — AB (ref 3.5–5.1)
SODIUM: 129 mmol/L — AB (ref 136–145)

## 2014-02-02 LAB — CBC WITH DIFFERENTIAL/PLATELET
BASOS ABS: 0.1 10*3/uL (ref 0.0–0.1)
Basophil %: 0.4 %
EOS ABS: 0 10*3/uL (ref 0.0–0.7)
EOS PCT: 0.1 %
HCT: 27.9 % — AB (ref 35.0–47.0)
HGB: 9.2 g/dL — ABNORMAL LOW (ref 12.0–16.0)
Lymphocyte #: 1.9 10*3/uL (ref 1.0–3.6)
Lymphocyte %: 7.9 %
MCH: 35.3 pg — AB (ref 26.0–34.0)
MCHC: 32.8 g/dL (ref 32.0–36.0)
MCV: 108 fL — ABNORMAL HIGH (ref 80–100)
MONO ABS: 0.9 x10 3/mm (ref 0.2–0.9)
Monocyte %: 3.8 %
NEUTROS ABS: 20.7 10*3/uL — AB (ref 1.4–6.5)
Neutrophil %: 87.8 %
PLATELETS: 350 10*3/uL (ref 150–440)
RBC: 2.59 10*6/uL — AB (ref 3.80–5.20)
RDW: 14 % (ref 11.5–14.5)
WBC: 23.6 10*3/uL — ABNORMAL HIGH (ref 3.6–11.0)

## 2014-02-03 LAB — BASIC METABOLIC PANEL
Anion Gap: 9 (ref 7–16)
BUN: 39 mg/dL — ABNORMAL HIGH (ref 7–18)
CALCIUM: 9.1 mg/dL (ref 8.5–10.1)
CO2: 32 mmol/L (ref 21–32)
Chloride: 93 mmol/L — ABNORMAL LOW (ref 98–107)
Creatinine: 6.12 mg/dL — ABNORMAL HIGH (ref 0.60–1.30)
EGFR (African American): 8 — ABNORMAL LOW
GFR CALC NON AF AMER: 7 — AB
Glucose: 185 mg/dL — ABNORMAL HIGH (ref 65–99)
OSMOLALITY: 282 (ref 275–301)
POTASSIUM: 4.3 mmol/L (ref 3.5–5.1)
Sodium: 134 mmol/L — ABNORMAL LOW (ref 136–145)

## 2014-02-03 LAB — CBC WITH DIFFERENTIAL/PLATELET
Basophil #: 0.1 10*3/uL (ref 0.0–0.1)
Basophil %: 0.7 %
EOS PCT: 1.4 %
Eosinophil #: 0.2 10*3/uL (ref 0.0–0.7)
HCT: 28.1 % — ABNORMAL LOW (ref 35.0–47.0)
HGB: 9.4 g/dL — AB (ref 12.0–16.0)
Lymphocyte #: 3.5 10*3/uL (ref 1.0–3.6)
Lymphocyte %: 30.1 %
MCH: 35.7 pg — ABNORMAL HIGH (ref 26.0–34.0)
MCHC: 33.5 g/dL (ref 32.0–36.0)
MCV: 107 fL — AB (ref 80–100)
Monocyte #: 0.7 x10 3/mm (ref 0.2–0.9)
Monocyte %: 5.8 %
NEUTROS ABS: 7.2 10*3/uL — AB (ref 1.4–6.5)
NEUTROS PCT: 62 %
Platelet: 342 10*3/uL (ref 150–440)
RBC: 2.64 10*6/uL — ABNORMAL LOW (ref 3.80–5.20)
RDW: 13.7 % (ref 11.5–14.5)
WBC: 11.6 10*3/uL — ABNORMAL HIGH (ref 3.6–11.0)

## 2014-02-03 LAB — VANCOMYCIN, TROUGH: VANCOMYCIN, TROUGH: 18 ug/mL (ref 10–20)

## 2014-02-03 LAB — PHOSPHORUS: Phosphorus: 4 mg/dL (ref 2.5–4.9)

## 2014-02-03 LAB — CULTURE, BLOOD (SINGLE)

## 2014-02-04 LAB — EXPECTORATED SPUTUM ASSESSMENT W GRAM STAIN, RFLX TO RESP C

## 2014-02-04 LAB — HEMOGLOBIN A1C: Hemoglobin A1C: 5.5 % (ref 4.2–6.3)

## 2014-02-05 LAB — CULTURE, BLOOD (SINGLE)

## 2014-02-28 ENCOUNTER — Emergency Department: Payer: Self-pay | Admitting: Emergency Medicine

## 2014-02-28 LAB — COMPREHENSIVE METABOLIC PANEL
ANION GAP: 7 (ref 7–16)
AST: 22 U/L (ref 15–37)
Albumin: 3.9 g/dL (ref 3.4–5.0)
Alkaline Phosphatase: 519 U/L — ABNORMAL HIGH
BUN: 44 mg/dL — AB (ref 7–18)
Bilirubin,Total: 0.3 mg/dL (ref 0.2–1.0)
CALCIUM: 8.8 mg/dL (ref 8.5–10.1)
CO2: 28 mmol/L (ref 21–32)
CREATININE: 9.27 mg/dL — AB (ref 0.60–1.30)
Chloride: 99 mmol/L (ref 98–107)
EGFR (African American): 5 — ABNORMAL LOW
EGFR (Non-African Amer.): 4 — ABNORMAL LOW
GLUCOSE: 100 mg/dL — AB (ref 65–99)
Osmolality: 280 (ref 275–301)
POTASSIUM: 5.7 mmol/L — AB (ref 3.5–5.1)
SGPT (ALT): 14 U/L (ref 12–78)
Sodium: 134 mmol/L — ABNORMAL LOW (ref 136–145)
TOTAL PROTEIN: 8.7 g/dL — AB (ref 6.4–8.2)

## 2014-02-28 LAB — CBC
HCT: 35.8 % (ref 35.0–47.0)
HGB: 12.2 g/dL (ref 12.0–16.0)
MCH: 36.5 pg — AB (ref 26.0–34.0)
MCHC: 34.1 g/dL (ref 32.0–36.0)
MCV: 107 fL — ABNORMAL HIGH (ref 80–100)
Platelet: 268 10*3/uL (ref 150–440)
RBC: 3.34 10*6/uL — ABNORMAL LOW (ref 3.80–5.20)
RDW: 15 % — ABNORMAL HIGH (ref 11.5–14.5)
WBC: 6.3 10*3/uL (ref 3.6–11.0)

## 2014-02-28 LAB — CK TOTAL AND CKMB (NOT AT ARMC)
CK, Total: 73 U/L
CK-MB: 0.6 ng/mL (ref 0.5–3.6)

## 2014-02-28 LAB — TROPONIN I: Troponin-I: <0.02 ng/mL

## 2014-03-05 LAB — CULTURE, BLOOD (SINGLE)

## 2014-03-11 ENCOUNTER — Emergency Department: Payer: Self-pay | Admitting: Emergency Medicine

## 2014-04-06 ENCOUNTER — Ambulatory Visit: Payer: Self-pay | Admitting: Vascular Surgery

## 2014-04-10 ENCOUNTER — Encounter (HOSPITAL_COMMUNITY): Payer: Self-pay | Admitting: Emergency Medicine

## 2014-04-10 ENCOUNTER — Emergency Department (HOSPITAL_COMMUNITY)
Admission: EM | Admit: 2014-04-10 | Discharge: 2014-04-10 | Disposition: A | Discharge status: Home / Self Care | Payer: Medicare Other | Attending: Emergency Medicine | Admitting: Emergency Medicine

## 2014-04-10 DIAGNOSIS — Z7982 Long term (current) use of aspirin: Secondary | ICD-10-CM | POA: Diagnosis not present

## 2014-04-10 DIAGNOSIS — Z8701 Personal history of pneumonia (recurrent): Secondary | ICD-10-CM | POA: Insufficient documentation

## 2014-04-10 DIAGNOSIS — K219 Gastro-esophageal reflux disease without esophagitis: Secondary | ICD-10-CM | POA: Insufficient documentation

## 2014-04-10 DIAGNOSIS — D649 Anemia, unspecified: Secondary | ICD-10-CM | POA: Diagnosis not present

## 2014-04-10 DIAGNOSIS — Z992 Dependence on renal dialysis: Secondary | ICD-10-CM | POA: Insufficient documentation

## 2014-04-10 DIAGNOSIS — M129 Arthropathy, unspecified: Secondary | ICD-10-CM | POA: Insufficient documentation

## 2014-04-10 DIAGNOSIS — F17200 Nicotine dependence, unspecified, uncomplicated: Secondary | ICD-10-CM | POA: Diagnosis not present

## 2014-04-10 DIAGNOSIS — Z791 Long term (current) use of non-steroidal anti-inflammatories (NSAID): Secondary | ICD-10-CM | POA: Insufficient documentation

## 2014-04-10 DIAGNOSIS — F3289 Other specified depressive episodes: Secondary | ICD-10-CM | POA: Diagnosis not present

## 2014-04-10 DIAGNOSIS — I509 Heart failure, unspecified: Secondary | ICD-10-CM | POA: Insufficient documentation

## 2014-04-10 DIAGNOSIS — F411 Generalized anxiety disorder: Secondary | ICD-10-CM | POA: Insufficient documentation

## 2014-04-10 DIAGNOSIS — F329 Major depressive disorder, single episode, unspecified: Secondary | ICD-10-CM | POA: Diagnosis not present

## 2014-04-10 DIAGNOSIS — N186 End stage renal disease: Secondary | ICD-10-CM | POA: Diagnosis not present

## 2014-04-10 DIAGNOSIS — I209 Angina pectoris, unspecified: Secondary | ICD-10-CM | POA: Diagnosis not present

## 2014-04-10 DIAGNOSIS — Z79899 Other long term (current) drug therapy: Secondary | ICD-10-CM | POA: Diagnosis not present

## 2014-04-10 DIAGNOSIS — Z21 Asymptomatic human immunodeficiency virus [HIV] infection status: Secondary | ICD-10-CM | POA: Insufficient documentation

## 2014-04-10 DIAGNOSIS — I10 Essential (primary) hypertension: Secondary | ICD-10-CM | POA: Insufficient documentation

## 2014-04-10 DIAGNOSIS — I120 Hypertensive chronic kidney disease with stage 5 chronic kidney disease or end stage renal disease: Secondary | ICD-10-CM | POA: Insufficient documentation

## 2014-04-10 DIAGNOSIS — M7989 Other specified soft tissue disorders: Secondary | ICD-10-CM | POA: Diagnosis present

## 2014-04-10 LAB — CBC WITH DIFFERENTIAL/PLATELET
Basophils Absolute: 0 10*3/uL (ref 0.0–0.1)
Basophils Relative: 0 % (ref 0–1)
Eosinophils Absolute: 0.2 10*3/uL (ref 0.0–0.7)
Eosinophils Relative: 2 % (ref 0–5)
HCT: 38 % (ref 36.0–46.0)
Hemoglobin: 12.3 g/dL (ref 12.0–15.0)
Lymphocytes Relative: 39 % (ref 12–46)
Lymphs Abs: 2.6 10*3/uL (ref 0.7–4.0)
MCH: 34.6 pg — ABNORMAL HIGH (ref 26.0–34.0)
MCHC: 32.4 g/dL (ref 30.0–36.0)
MCV: 106.7 fL — ABNORMAL HIGH (ref 78.0–100.0)
Monocytes Absolute: 0.8 10*3/uL (ref 0.1–1.0)
Monocytes Relative: 12 % (ref 3–12)
Neutro Abs: 3.2 10*3/uL (ref 1.7–7.7)
Neutrophils Relative %: 47 % (ref 43–77)
Platelets: 221 10*3/uL (ref 150–400)
RBC: 3.56 MIL/uL — ABNORMAL LOW (ref 3.87–5.11)
RDW: 13.7 % (ref 11.5–15.5)
WBC: 6.8 10*3/uL (ref 4.0–10.5)

## 2014-04-10 LAB — BASIC METABOLIC PANEL
Anion gap: 18 — ABNORMAL HIGH (ref 5–15)
BUN: 44 mg/dL — ABNORMAL HIGH (ref 6–23)
CO2: 25 mEq/L (ref 19–32)
Calcium: 8.1 mg/dL — ABNORMAL LOW (ref 8.4–10.5)
Chloride: 94 mEq/L — ABNORMAL LOW (ref 96–112)
Creatinine, Ser: 12.23 mg/dL — ABNORMAL HIGH (ref 0.50–1.10)
GFR calc Af Amer: 3 mL/min — ABNORMAL LOW (ref >90)
GFR calc non Af Amer: 3 mL/min — ABNORMAL LOW (ref >90)
Glucose, Bld: 88 mg/dL (ref 70–99)
Potassium: 4.8 mEq/L (ref 3.7–5.3)
Sodium: 137 mEq/L (ref 137–147)

## 2014-04-10 LAB — PHOSPHORUS: Phosphorus: 3.3 mg/dL (ref 2.3–4.6)

## 2014-04-10 LAB — MAGNESIUM: Magnesium: 2.5 mg/dL (ref 1.5–2.5)

## 2014-04-10 NOTE — ED Notes (Signed)
[place] to [place] and hasnt had dialysis since Thurs. Pt states she is swollen. Denies chest pain and SOB. Also states right hip and knee hurting due to arthritis and wants xray.

## 2014-04-10 NOTE — Discharge Instructions (Signed)
Your niece needs to take your to your dialysis [place] in [place] tomorrow as you are normally scheduled. You need to be dialyzed there and discuss with them about getting your care transferred to a dialysis center closer to where you will now be living.

## 2014-04-10 NOTE — ED Notes (Signed)
MD at bedside. 

## 2014-04-10 NOTE — ED Provider Notes (Signed)
CSN: [id number]     Arrival date & time 04/10/14  0720 History   First MD Initiated Contact with Patient 04/10/14 [phone]     Chief Complaint  Patient presents with  . Leg Swelling     (Consider location/radiation/quality/duration/timing/severity/associated sxs/prior Treatment) HPI  62yF essentially presenting to get dialysis. ESRD. Last dialysis on Tuesday. [place] live with family member in [place]. Did not make dialysis arrangements prior to this. C/o swelling in hands and feet. No SOB.   Past Medical History  Diagnosis Date  . Hypertension   . HIV infection   . Morbid obesity   . ESRD (end stage renal disease) on dialysis 09/30/2013    [place].  ESRD due to HTN vs drug abuse according to pt.  [facility] until Feb 2015 when she was admitted to a SNF due to homelessness and drug abuse.  Then [facility] schedule.     . Anginal pain   . CHF (congestive heart failure)   . Shortness of breath   . Pneumonia   . Depression   . Anxiety   . GERD (gastroesophageal reflux disease)   . Arthritis   . Anemia    Past Surgical History  Procedure Laterality Date  . Arteriovenous graft placement    . Cardiac catheterization     History reviewed. No pertinent family history. History  Substance Use Topics  . Smoking status: Current Every Day Smoker -- 0.25 packs/day    Types: Cigarettes  . Smokeless tobacco: Not on file  . Alcohol Use: 0.6 oz/week    1 Cans of beer per week   OB History   Grav Para Term Preterm Abortions TAB SAB Ect Mult Living                 Review of Systems  All systems reviewed and negative, other than as noted in HPI.   Allergies  Tramadol and Morphine and related  Home Medications   Prior to Admission medications   Medication Sig Start Date End Date Taking? Authorizing Provider  amitriptyline (ELAVIL) 50 MG tablet Take 50 mg by mouth at bedtime.    Yes Historical Provider, MD  amLODipine (NORVASC) 5 MG tablet Take 1 tablet (5 mg total) by mouth at bedtime. 10/28/13  Yes Elease EtienneAnand D Hongalgi, MD  aspirin EC 81 MG EC tablet Take 1 tablet (81 mg total) by mouth daily. 11/12/13  Yes Belkys A Regalado, MD  Aspirin-Salicylamide-Caffeine (BC HEADACHE PO) Take 2 packets by mouth 2 (two) times daily as needed. pain   Yes Historical Provider, MD  carvedilol (COREG) 3.125 MG tablet Take 1 tablet (3.125 mg total) by mouth 2 (two) times daily with a meal. 10/28/13  Yes Elease EtienneAnand D Hongalgi, MD  diclofenac sodium (VOLTAREN) 1 % GEL Apply 4 g topically 4 (four) times daily.   Yes Historical Provider, MD  diphenhydrAMINE (BENADRYL) 50 MG capsule Take 1 capsule (50 mg total) by mouth at bedtime. 10/28/13  Yes Elease EtienneAnand D Hongalgi, MD  FLUoxetine (PROZAC) 20 MG capsule Take 1 capsule (20 mg total) by mouth daily. 10/28/13  Yes Elease EtienneAnand D Hongalgi, MD  fluticasone (FLONASE) 50 MCG/ACT nasal spray Place 1 spray into both nostrils daily. 01/20/14  Yes Tora KindredMarianne L York, PA-C  HYDROcodone-acetaminophen (NORCO/VICODIN) 5-325 MG per tablet Take 1 tablet by mouth every 4 (four) hours as needed for moderate pain.   Yes Historical  Provider, MD  hydrOXYzine (ATARAX/VISTARIL) 25 MG tablet Take 1 tablet (25 mg total) by mouth 3 (three) times daily. 10/28/13  Yes Elease EtienneAnand D Hongalgi, MD  lamiVUDine (EPIVIR) 10 MG/ML solution Take 5 mLs (50 mg total) by mouth daily. 10/28/13  Yes Elease EtienneAnand D Hongalgi, MD  loratadine (CLARITIN) 10 MG tablet Take 1 tablet (10 mg total) by mouth daily. 01/20/14  Yes Marianne L York, PA-C  LORazepam (ATIVAN) 0.5 MG tablet Take 1 tablet (0.5 mg total) by mouth every 6 (six) hours as needed (agitation). 01/20/14  Yes Marianne L York, PA-C  multivitamin (RENA-VIT) TABS tablet Take 1 tablet by mouth at bedtime. 10/28/13  Yes Elease EtienneAnand D Hongalgi, MD  omeprazole (PRILOSEC) 20 MG capsule Take 20 mg by mouth daily.   Yes Historical Provider, MD  oxyCODONE (OXY IR/ROXICODONE) 5 MG immediate  release tablet Take 1 tablet (5 mg total) by mouth every 6 (six) hours as needed for moderate pain. 01/20/14  Yes Marianne L York, PA-C  polyethylene glycol (MIRALAX / GLYCOLAX) packet Take 17 g by mouth daily.   Yes Historical Provider, MD  Protein POWD Take 1 scoop by mouth 3 (three) times daily. Mixed with applesauce   Yes Historical Provider, MD  raltegravir (ISENTRESS) 400 MG tablet Take 400 mg by mouth 2 (two) times daily.    Yes Historical Provider, MD  senna-docusate (SENOKOT-S) 8.6-50 MG per tablet Take 2 tablets by mouth daily.   Yes Historical Provider, MD  sevelamer carbonate (RENVELA) 800 MG tablet Take 2 tablets (1,600 mg total) by mouth 2 (two) times daily as needed (with snacks for high phosphorus). 01/20/14  Yes Tora KindredMarianne L York, PA-C  tenofovir (VIREAD) 300 MG tablet Take 300 mg by mouth every Friday. Takes on Fridays.   Yes Historical Provider, MD   BP 155/73  Pulse 92  Temp(Src) 98.6 F (37 C) (Oral)  Resp 17  SpO2 100% Physical Exam  Nursing note and vitals reviewed. Constitutional: She appears well-developed and well-nourished. No distress.  Laying in bed eating sausage biscuit. NAD.   HENT:  Head: Normocephalic and atraumatic.  Eyes: Conjunctivae are normal. Right eye exhibits no discharge. Left eye exhibits no discharge.  Neck: Neck supple.  Cardiovascular: Normal rate, regular rhythm and normal heart sounds.  Exam reveals no gallop and no friction rub.   No murmur heard. Fistula L forearm. Palpable thrill  Pulmonary/Chest: Effort normal and breath sounds normal. No respiratory distress.  Speaking in complete sentences.   Abdominal: Soft. She exhibits no distension. There is no tenderness.  Musculoskeletal: She exhibits no edema and no tenderness.  Neurological: She is alert.  Skin: Skin is warm and dry.  Psychiatric: She has a normal mood and affect. Her behavior is normal. Thought content normal.    ED Course  Procedures (including critical care time) Labs  Review Labs Reviewed  BASIC METABOLIC PANEL - Abnormal; Notable for the following:    Chloride 94 (*)    BUN 44 (*)    Creatinine, Ser 12.23 (*)    Calcium 8.1 (*)    GFR calc non Af Amer 3 (*)    GFR calc Af Amer 3 (*)    Anion gap 18 (*)    All other components within normal limits  CBC WITH DIFFERENTIAL - Abnormal; Notable for the following:    RBC 3.56 (*)    MCV 106.7 (*)    MCH 34.6 (*)    All other components within normal limits  MAGNESIUM  PHOSPHORUS  Imaging Review No results found.   EKG Interpretation   Date/Time:  Friday April 10 2014 07:35:38 EDT Ventricular Rate:  93 PR Interval:  191 QRS Duration: 109 QT Interval:  435 QTC Calculation: 541 R Axis:   9 Text Interpretation:  Sinus rhythm Borderline T abnormalities, inferior  leads Prolonged QT interval ED PHYSICIAN INTERPRETATION AVAILABLE IN [facility] Confirmed by TEST, Record ([id number]) on 04/12/2014 8:35:43 AM      MDM   Final diagnoses:  ESRD (end stage renal disease) on dialysis    62yF with ESRD who missed dialysis. Some edema on exam, but no respiratory complaints. Lungs clear. O2 sats good. Will check labs. Unless indication for urgent dialysis, will try to make arrangements for outpt dialysis.   Discussed with nephrology. Can't make transfer arrangements from a [place] center. Pt has transportation from niece. Instructed that she needs to go to her dialysis center tomorrow as scheduled to get dialysis and needs to discuss with them that she needs care transferred to facility in [place].     Raeford Razor, MD 04/12/14 1052

## 2014-04-10 NOTE — ED Notes (Signed)
PT ambulated with baseline gait; VSS; A&Ox3; no signs of distress; respirations even and unlabored; skin warm and dry; no questions upon discharge.  

## 2014-04-11 ENCOUNTER — Encounter (HOSPITAL_COMMUNITY): Payer: Self-pay | Admitting: Emergency Medicine

## 2014-04-11 ENCOUNTER — Observation Stay (HOSPITAL_COMMUNITY): Payer: Medicare Other

## 2014-04-11 ENCOUNTER — Observation Stay (HOSPITAL_COMMUNITY)
Admission: EM | Admit: 2014-04-11 | Discharge: 2014-04-12 | Disposition: A | Discharge status: Home / Self Care | Payer: Medicare Other | Attending: Internal Medicine | Admitting: Internal Medicine

## 2014-04-11 DIAGNOSIS — Z21 Asymptomatic human immunodeficiency virus [HIV] infection status: Secondary | ICD-10-CM | POA: Diagnosis not present

## 2014-04-11 DIAGNOSIS — Z9889 Other specified postprocedural states: Secondary | ICD-10-CM | POA: Diagnosis not present

## 2014-04-11 DIAGNOSIS — F17200 Nicotine dependence, unspecified, uncomplicated: Secondary | ICD-10-CM | POA: Diagnosis not present

## 2014-04-11 DIAGNOSIS — K219 Gastro-esophageal reflux disease without esophagitis: Secondary | ICD-10-CM | POA: Insufficient documentation

## 2014-04-11 DIAGNOSIS — E875 Hyperkalemia: Secondary | ICD-10-CM

## 2014-04-11 DIAGNOSIS — F329 Major depressive disorder, single episode, unspecified: Secondary | ICD-10-CM | POA: Diagnosis not present

## 2014-04-11 DIAGNOSIS — M129 Arthropathy, unspecified: Secondary | ICD-10-CM | POA: Insufficient documentation

## 2014-04-11 DIAGNOSIS — D649 Anemia, unspecified: Secondary | ICD-10-CM | POA: Diagnosis not present

## 2014-04-11 DIAGNOSIS — I209 Angina pectoris, unspecified: Secondary | ICD-10-CM | POA: Insufficient documentation

## 2014-04-11 DIAGNOSIS — Z5900 Homelessness unspecified: Secondary | ICD-10-CM

## 2014-04-11 DIAGNOSIS — Z79899 Other long term (current) drug therapy: Secondary | ICD-10-CM | POA: Diagnosis not present

## 2014-04-11 DIAGNOSIS — I10 Essential (primary) hypertension: Secondary | ICD-10-CM | POA: Diagnosis present

## 2014-04-11 DIAGNOSIS — Z8701 Personal history of pneumonia (recurrent): Secondary | ICD-10-CM | POA: Insufficient documentation

## 2014-04-11 DIAGNOSIS — Z7982 Long term (current) use of aspirin: Secondary | ICD-10-CM | POA: Diagnosis not present

## 2014-04-11 DIAGNOSIS — N186 End stage renal disease: Secondary | ICD-10-CM | POA: Diagnosis not present

## 2014-04-11 DIAGNOSIS — I509 Heart failure, unspecified: Secondary | ICD-10-CM | POA: Diagnosis present

## 2014-04-11 DIAGNOSIS — F3289 Other specified depressive episodes: Secondary | ICD-10-CM | POA: Insufficient documentation

## 2014-04-11 DIAGNOSIS — I150 Renovascular hypertension: Secondary | ICD-10-CM

## 2014-04-11 DIAGNOSIS — M7989 Other specified soft tissue disorders: Secondary | ICD-10-CM | POA: Diagnosis present

## 2014-04-11 DIAGNOSIS — I120 Hypertensive chronic kidney disease with stage 5 chronic kidney disease or end stage renal disease: Secondary | ICD-10-CM | POA: Insufficient documentation

## 2014-04-11 DIAGNOSIS — F411 Generalized anxiety disorder: Secondary | ICD-10-CM | POA: Diagnosis not present

## 2014-04-11 DIAGNOSIS — Z9119 Patient's noncompliance with other medical treatment and regimen: Secondary | ICD-10-CM

## 2014-04-11 DIAGNOSIS — R0602 Shortness of breath: Secondary | ICD-10-CM | POA: Diagnosis present

## 2014-04-11 DIAGNOSIS — Z992 Dependence on renal dialysis: Secondary | ICD-10-CM | POA: Diagnosis not present

## 2014-04-11 DIAGNOSIS — Z91199 Patient's noncompliance with other medical treatment and regimen due to unspecified reason: Secondary | ICD-10-CM

## 2014-04-11 DIAGNOSIS — N184 Chronic kidney disease, stage 4 (severe): Secondary | ICD-10-CM

## 2014-04-11 DIAGNOSIS — B20 Human immunodeficiency virus [HIV] disease: Secondary | ICD-10-CM | POA: Diagnosis present

## 2014-04-11 LAB — I-STAT CHEM 8, ED
BUN: 56 mg/dL — ABNORMAL HIGH (ref 6–23)
Calcium, Ion: 1 mmol/L — ABNORMAL LOW (ref 1.13–1.30)
Chloride: 100 mEq/L (ref 96–112)
Creatinine, Ser: 15.6 mg/dL — ABNORMAL HIGH (ref 0.50–1.10)
Glucose, Bld: 96 mg/dL (ref 70–99)
HCT: 38 % (ref 36.0–46.0)
Hemoglobin: 12.9 g/dL (ref 12.0–15.0)
Potassium: 5.4 mEq/L — ABNORMAL HIGH (ref 3.7–5.3)
Sodium: 136 mEq/L — ABNORMAL LOW (ref 137–147)
TCO2: 24 mmol/L (ref 0–100)

## 2014-04-11 MED ORDER — NEPRO/CARBSTEADY PO LIQD: 237.0000 mL | ORAL | Status: DC | PRN

## 2014-04-11 MED ORDER — HEPARIN SODIUM (PORCINE) 5000 UNIT/ML IJ SOLN
5000.0000 [IU] | Freq: Three times a day (TID) | INTRAMUSCULAR | Status: DC
  Administered 2014-04-12: 5000 [IU] via SUBCUTANEOUS
  Filled 2014-04-11 (×5): qty 1

## 2014-04-11 MED ORDER — RALTEGRAVIR POTASSIUM 400 MG PO TABS
400.0000 mg | ORAL_TABLET | Freq: Two times a day (BID) | ORAL | Status: DC
  Administered 2014-04-11 – 2014-04-12 (×2): 400 mg via ORAL
  Filled 2014-04-11 (×3): qty 1

## 2014-04-11 MED ORDER — CARVEDILOL 3.125 MG PO TABS
3.1250 mg | ORAL_TABLET | Freq: Two times a day (BID) | ORAL | Status: DC
  Filled 2014-04-11 (×4): qty 1

## 2014-04-11 MED ORDER — FLUOXETINE HCL 20 MG PO CAPS
20.0000 mg | ORAL_CAPSULE | Freq: Every day | ORAL | Status: DC
  Administered 2014-04-12: 20 mg via ORAL
  Filled 2014-04-11 (×2): qty 1

## 2014-04-11 MED ORDER — SODIUM CHLORIDE 0.9 % IV SOLN: 100.0000 mL | INTRAVENOUS | Status: DC | PRN

## 2014-04-11 MED ORDER — DICLOFENAC SODIUM 1 % TD GEL
4.0000 g | Freq: Four times a day (QID) | TRANSDERMAL | Status: DC
  Filled 2014-04-11: qty 100

## 2014-04-11 MED ORDER — DIPHENHYDRAMINE HCL 50 MG PO CAPS: 50.0000 mg | ORAL_CAPSULE | Freq: Every day | ORAL | Status: DC

## 2014-04-11 MED ORDER — LIDOCAINE HCL (PF) 1 % IJ SOLN: 5.0000 mL | INTRAMUSCULAR | Status: DC | PRN

## 2014-04-11 MED ORDER — ONDANSETRON HCL 4 MG/2ML IJ SOLN: 4.0000 mg | Freq: Four times a day (QID) | INTRAMUSCULAR | Status: DC | PRN

## 2014-04-11 MED ORDER — HYDRALAZINE HCL 20 MG/ML IJ SOLN
10.0000 mg | Freq: Once | INTRAMUSCULAR | Status: AC
  Administered 2014-04-11: 10 mg via INTRAVENOUS
  Filled 2014-04-11: qty 0.5

## 2014-04-11 MED ORDER — AMITRIPTYLINE HCL 50 MG PO TABS
50.0000 mg | ORAL_TABLET | Freq: Every day | ORAL | Status: DC
  Administered 2014-04-11: 50 mg via ORAL
  Filled 2014-04-11 (×2): qty 1

## 2014-04-11 MED ORDER — LIDOCAINE-PRILOCAINE 2.5-2.5 % EX CREA: 1.0000 "application " | TOPICAL_CREAM | CUTANEOUS | Status: DC | PRN

## 2014-04-11 MED ORDER — AMLODIPINE BESYLATE 10 MG PO TABS: 10.0000 mg | ORAL_TABLET | Freq: Once | ORAL | Status: DC

## 2014-04-11 MED ORDER — PENTAFLUOROPROP-TETRAFLUOROETH EX AERO: 1.0000 "application " | INHALATION_SPRAY | CUTANEOUS | Status: DC | PRN

## 2014-04-11 MED ORDER — OXYCODONE HCL 5 MG PO TABS
5.0000 mg | ORAL_TABLET | ORAL | Status: DC | PRN
  Administered 2014-04-11 – 2014-04-12 (×2): 5 mg via ORAL
  Filled 2014-04-11 (×2): qty 1

## 2014-04-11 MED ORDER — ASPIRIN EC 81 MG PO TBEC
81.0000 mg | DELAYED_RELEASE_TABLET | Freq: Every day | ORAL | Status: DC
  Administered 2014-04-12: 81 mg via ORAL
  Filled 2014-04-11 (×2): qty 1

## 2014-04-11 MED ORDER — ACETAMINOPHEN 650 MG RE SUPP: 650.0000 mg | Freq: Four times a day (QID) | RECTAL | Status: DC | PRN

## 2014-04-11 MED ORDER — LAMIVUDINE 10 MG/ML PO SOLN
50.0000 mg | Freq: Every day | ORAL | Status: DC
  Administered 2014-04-12: 50 mg via ORAL
  Filled 2014-04-11 (×2): qty 5

## 2014-04-11 MED ORDER — PANTOPRAZOLE SODIUM 40 MG PO TBEC
40.0000 mg | DELAYED_RELEASE_TABLET | Freq: Every day | ORAL | Status: DC
  Administered 2014-04-12: 40 mg via ORAL
  Filled 2014-04-11: qty 1

## 2014-04-11 MED ORDER — ACETAMINOPHEN 325 MG PO TABS: 650.0000 mg | ORAL_TABLET | Freq: Four times a day (QID) | ORAL | Status: DC | PRN

## 2014-04-11 MED ORDER — AMLODIPINE BESYLATE 5 MG PO TABS
5.0000 mg | ORAL_TABLET | Freq: Every day | ORAL | Status: DC
  Administered 2014-04-11: 5 mg via ORAL
  Filled 2014-04-11 (×2): qty 1

## 2014-04-11 MED ORDER — RENA-VITE PO TABS
1.0000 | ORAL_TABLET | Freq: Every day | ORAL | Status: DC
  Administered 2014-04-11: 1 via ORAL
  Filled 2014-04-11 (×2): qty 1

## 2014-04-11 MED ORDER — HYDROXYZINE HCL 25 MG PO TABS
25.0000 mg | ORAL_TABLET | Freq: Three times a day (TID) | ORAL | Status: DC
  Administered 2014-04-11 – 2014-04-12 (×2): 25 mg via ORAL
  Filled 2014-04-11 (×4): qty 1

## 2014-04-11 MED ORDER — ONDANSETRON HCL 4 MG PO TABS: 4.0000 mg | ORAL_TABLET | Freq: Four times a day (QID) | ORAL | Status: DC | PRN

## 2014-04-11 MED ORDER — CARVEDILOL 3.125 MG PO TABS
3.1250 mg | ORAL_TABLET | Freq: Once | ORAL | Status: DC
  Filled 2014-04-11: qty 1

## 2014-04-11 MED ORDER — SEVELAMER CARBONATE 800 MG PO TABS
1600.0000 mg | ORAL_TABLET | Freq: Two times a day (BID) | ORAL | Status: DC | PRN
  Administered 2014-04-12: 1600 mg via ORAL
  Filled 2014-04-11: qty 2

## 2014-04-11 MED ORDER — HEPARIN SODIUM (PORCINE) 1000 UNIT/ML DIALYSIS
1000.0000 [IU] | INTRAMUSCULAR | Status: DC | PRN
  Filled 2014-04-11: qty 1

## 2014-04-11 MED ORDER — SENNOSIDES-DOCUSATE SODIUM 8.6-50 MG PO TABS
2.0000 | ORAL_TABLET | Freq: Every day | ORAL | Status: DC
  Administered 2014-04-12: 2 via ORAL
  Filled 2014-04-11: qty 2

## 2014-04-11 MED ORDER — POLYETHYLENE GLYCOL 3350 17 G PO PACK
17.0000 g | PACK | Freq: Every day | ORAL | Status: DC
  Administered 2014-04-12: 17 g via ORAL
  Filled 2014-04-11 (×2): qty 1

## 2014-04-11 MED ORDER — HEPARIN SODIUM (PORCINE) 1000 UNIT/ML DIALYSIS
7000.0000 [IU] | Freq: Once | INTRAMUSCULAR | Status: AC
  Administered 2014-04-11: 7000 [IU] via INTRAVENOUS_CENTRAL
  Filled 2014-04-11: qty 7

## 2014-04-11 MED ORDER — DOXERCALCIFEROL 4 MCG/2ML IV SOLN: 7.0000 ug | INTRAVENOUS | Status: DC

## 2014-04-11 MED ORDER — TENOFOVIR DISOPROXIL FUMARATE 300 MG PO TABS: 300.0000 mg | ORAL_TABLET | ORAL | Status: DC

## 2014-04-11 MED ORDER — SODIUM CHLORIDE 0.9 % IV SOLN: 125.0000 mg | INTRAVENOUS | Status: DC

## 2014-04-11 NOTE — Progress Notes (Signed)
  Admission note:  Arrival Method: stretcher from ED Mental Orientation: A&O x 4 Telemetry: none ordered Assessment: See Doc Flowsheets Skin: assessed and intact IV: left upper arm fistula.  Positive for bruit and thrill.   Pain: denies Tubes: n/a Safety Measures:  Fall Prevention Safety Plan: Educated patient on Fall Prevention Safety Plan.  Patient verbalized understanding.   Admission Screening: Completed with patient [id number] Orientation: Patient has been oriented to the unit, staff and to the room.

## 2014-04-11 NOTE — ED Notes (Signed)
Dr. Bednar at bedside. 

## 2014-04-11 NOTE — ED Provider Notes (Signed)
Medical screening examination/treatment/procedure(s) were conducted as a shared visit with non-physician practitioner(s) and myself.  I personally evaluated the patient during the encounter.   EKG Interpretation None     Pt aware [facility] is not an outpatient [facility] and she needs admit for dialysis to occur and she missed dialysis 2 days ago and today.  Hurman HornJohn M Narek Kniss, MD 04/11/14 [phone]

## 2014-04-11 NOTE — ED Notes (Signed)
Pt give ice chips. Pt in room eating a [employer] sandwich.

## 2014-04-11 NOTE — Progress Notes (Signed)
Weekend CSW met with patient to assess. Patient states that she recently moved to [place] to live with her niece from [place] and has not changed her dialysis center yet. CSW educated patient that on speaking to the social worker at her dialysis center in [place] regarding transferring services to [place]. CSW provided patient with information on how to change her Medicaid to [employer] home Chief Technology Officer, Emergency planning/management officer, [place] transportation to medical appointments, and contact information for her dialysis center in [place]. Patient thanked CSW, has no further questions at this time.   Tilden Fossa, MSW, Circle Clinical Social Worker [facility] Emergency Dept. [phone]

## 2014-04-11 NOTE — ED Notes (Signed)
PT had a sudden change in living status and had to move to [place]. Pt 's normal dialysis days are Tues., Thur., Sat. Pt reports her last dialysis was on the preceding Tuesday. Pt was receiving Dialysis treatments in [place] but has no transportation to [place].

## 2014-04-11 NOTE — ED Notes (Signed)
She missed her dialysis on Thursday due to transportation issues. She states today she feels achy all over and she know she needs dialysis. She came to ed yesterday seeking dialysis but "they told me it didn't look like i needed it yet."

## 2014-04-11 NOTE — H&P (Addendum)
[facility] History and Physical  [name] TIW:[id number] DOB: 01-03-1951 DOA: 04/11/2014  Referring physician:  Wayland Salinas PCP:  No PCP Per Patient   Chief Complaint:  SOB and swelling and fatigue  HPI:  The patient is a 63 y.o. year-old female with history of hypertension, congestive heart failure, HIV, end-stage renal disease on hemodialysis, currently no outpatient hemodialysis center set up, depression/anxiety, acid reflux, arthritis who presents with shortness of breath and swelling.  The patient states she was last at [facility] but she left for numerous reasons, including inadequate food and toilet paper and rude staff.  She blames the staff there for not arranging home health services and a new outpatient HD center for her before she left.  Last HD was on Tuesday.  She presented to the ER yesterday for dialysis, however, she was turned away because she did not need urgent HD.  She represented today with mild SOB and some mild swelling of the left leg.  Currently, she is denying feeling SOB.  She denies chest pain.  She was found to be mildly hyperkalemic and is being admitted for HD.  SW has already been trying to arrange for HD center and transportation.    In the emergency department, her hemoglobin was 12.9, sodium 136, potassium 5.4, BUN 56, creatinine 15.6. Her vital signs were notable for blood pressure of 198/108. She is being admitted for hemodialysis and to have R. Child psychotherapist and case manager assist her in finding outpatient hemodialysis center.    Review of Systems:  General:  Denies fevers, chills, + weight gain/fluid retention HEENT:  Denies changes to hearing and vision, rhinorrhea, sinus congestion, sore throat CV:  Denies chest pain and palpitations, lower extremity edema.  PULM:  + SOB, denies wheezing, cough.   GI:  Denies nausea, vomiting, constipation, diarrhea.   GU:  Denies suprapubic tenderness. ESRD ENDO:  Denies polydipsia.   HEME:   Denies hematemesis, blood in stools, melena, abnormal bruising or bleeding.  LYMPH:  Denies lymphadenopathy.   MSK:  Chronic severe arthralgias, myalgias.   DERM:  Denies skin rash or ulcer.   NEURO:  Denies focal numbness, weakness, slurred speech, confusion, facial droop.  PSYCH:  Denies anxiety and depression.    Past Medical History  Diagnosis Date  . Hypertension   . HIV infection   . Morbid obesity   . ESRD (end stage renal disease) on dialysis 09/30/2013    Started dialysis in [place], [place] around 2009.  ESRD due to HTN vs drug abuse according to pt.  Was on dialysis at [facility] until Feb 2015 when she was admitted to a SNF due to homelessness and drug abuse.  Then changed to [facility] on TTS schedule.     . Anginal pain   . CHF (congestive heart failure)   . Shortness of breath   . Pneumonia   . Depression   . Anxiety   . GERD (gastroesophageal reflux disease)   . Arthritis   . Anemia    Past Surgical History  Procedure Laterality Date  . Arteriovenous graft placement      left forearm  . Cardiac catheterization    . Laparotomy      states seh was cut open because seh was bleeding on the inside   Social History:  reports that she has been smoking Cigarettes.  She has been smoking about 0.50 packs per day. She does not have any smokeless tobacco history on file. She reports  that she uses illicit drugs (Marijuana and "Crack" cocaine) about once per week. She reports that she does not drink alcohol. Was recently in [place] nursing home and she left without allowing them to arrange for outpatient hemodialysis.  They also were not able to arrange for home health services.    [name] is whom she moved in with currently.  States her niece may be able to help her get to hemodialysis.    Allergies  Allergen Reactions  . Tramadol     "Gives me bad dreams"  . Morphine And Related Hives and Rash    Family History  Problem Relation Age of Onset  . Kidney failure  Other     niece  . High blood pressure    . Lung cancer    . Breast cancer Neg Hx   . Colon cancer Neg Hx   . Stroke Mother   . HIV/AIDS Brother     died of AIDS     Prior to Admission medications   Medication Sig Start Date End Date Taking? Authorizing Provider  amitriptyline (ELAVIL) 50 MG tablet Take 50 mg by mouth at bedtime.   Yes Historical Provider, MD  amLODipine (NORVASC) 5 MG tablet Take 1 tablet (5 mg total) by mouth at bedtime. 10/28/13  Yes Elease EtienneAnand D Hongalgi, MD  aspirin EC 81 MG EC tablet Take 1 tablet (81 mg total) by mouth daily. 11/12/13  Yes Belkys A Regalado, MD  Aspirin-Salicylamide-Caffeine (BC HEADACHE PO) Take 2 packets by mouth 2 (two) times daily as needed. pain   Yes Historical Provider, MD  carvedilol (COREG) 3.125 MG tablet Take 1 tablet (3.125 mg total) by mouth 2 (two) times daily with a meal. 10/28/13  Yes Elease EtienneAnand D Hongalgi, MD  diclofenac sodium (VOLTAREN) 1 % GEL Apply 4 g topically 4 (four) times daily.   Yes Historical Provider, MD  diphenhydrAMINE (BENADRYL) 50 MG capsule Take 1 capsule (50 mg total) by mouth at bedtime. 10/28/13  Yes Elease EtienneAnand D Hongalgi, MD  FLUoxetine (PROZAC) 20 MG capsule Take 1 capsule (20 mg total) by mouth daily. 10/28/13  Yes Elease EtienneAnand D Hongalgi, MD  fluticasone (FLONASE) 50 MCG/ACT nasal spray Place 1 spray into both nostrils daily. 01/20/14  Yes Tora KindredMarianne L York, PA-C  HYDROcodone-acetaminophen (NORCO/VICODIN) 5-325 MG per tablet Take 1 tablet by mouth every 4 (four) hours as needed for moderate pain.   Yes Historical Provider, MD  hydrOXYzine (ATARAX/VISTARIL) 25 MG tablet Take 1 tablet (25 mg total) by mouth 3 (three) times daily. 10/28/13  Yes Elease EtienneAnand D Hongalgi, MD  lamiVUDine (EPIVIR) 10 MG/ML solution Take 5 mLs (50 mg total) by mouth daily. 10/28/13  Yes Elease EtienneAnand D Hongalgi, MD  loratadine (CLARITIN) 10 MG tablet Take 1 tablet (10 mg total) by mouth daily. 01/20/14  Yes Marianne L York, PA-C  LORazepam (ATIVAN) 0.5 MG tablet Take 1 tablet (0.5  mg total) by mouth every 6 (six) hours as needed (agitation). 01/20/14  Yes Marianne L York, PA-C  multivitamin (RENA-VIT) TABS tablet Take 1 tablet by mouth at bedtime. 10/28/13  Yes Elease EtienneAnand D Hongalgi, MD  omeprazole (PRILOSEC) 20 MG capsule Take 20 mg by mouth daily.   Yes Historical Provider, MD  oxyCODONE (OXY IR/ROXICODONE) 5 MG immediate release tablet Take 1 tablet (5 mg total) by mouth every 6 (six) hours as needed for moderate pain. 01/20/14  Yes Marianne L York, PA-C  polyethylene glycol (MIRALAX / GLYCOLAX) packet Take 17 g by mouth daily.   Yes Historical  Provider, MD  Protein POWD Take 1 scoop by mouth 3 (three) times daily. Mixed with applesauce   Yes Historical Provider, MD  raltegravir (ISENTRESS) 400 MG tablet Take 400 mg by mouth 2 (two) times daily.    Yes Historical Provider, MD  senna-docusate (SENOKOT-S) 8.6-50 MG per tablet Take 2 tablets by mouth daily.   Yes Historical Provider, MD  sevelamer carbonate (RENVELA) 800 MG tablet Take 2 tablets (1,600 mg total) by mouth 2 (two) times daily as needed (with snacks for high phosphorus). 01/20/14  Yes Tora KindredMarianne L York, PA-C  tenofovir (VIREAD) 300 MG tablet Take 300 mg by mouth every Friday. Takes on Fridays.   Yes Historical Provider, MD   Physical Exam: Filed Vitals:   04/11/14 1430 04/11/14 1500 04/11/14 1607 04/11/14 1610  BP: 172/91 172/91  189/120  Pulse: 100 101  97  Temp:    98.4 F (36.9 C)  TempSrc:    Oral  Resp: 24 17  18   Height:   5\' 4"  (1.626 m)   Weight:   114.4 kg (252 lb 3.3 oz)   SpO2: 98% 100%  95%     General:  Obese BF, NAD  Eyes:  PERRL, anicteric, non-injected.  ENT:  Nares clear.  OP clear, non-erythematous without plaques or exudates.  MMM.  Neck:  Supple without TM or JVD.    Lymph:  No cervical, supraclavicular, or submandibular LAD.  Cardiovascular:  RRR, normal S1, S2, without m/r/g.  2+ pulses, warm extremities  Respiratory:  CTA bilaterally without increased WOB.  Abdomen:  NABS.   Soft, ND/NT.    Skin:  No rashes or focal lesions.  Musculoskeletal:  Normal bulk and tone.  Trace nonpitting LE edema of left foot with some TTP.    Psychiatric:  A & O x 4.  Appropriate affect.    Neurologic:  CN 3-12 intact.  5/5 strength.  Sensation intact.  Labs on Admission:  Basic Metabolic Panel:  Recent Labs Lab 04/10/14 0818 04/11/14 1205  NA 137 136*  K 4.8 5.4*  CL 94* 100  CO2 25  --   GLUCOSE 88 96  BUN 44* 56*  CREATININE 12.23* 15.60*  CALCIUM 8.1*  --   MG 2.5  --   PHOS 3.3  --    Liver Function Tests: No results found for this basename: AST, ALT, ALKPHOS, BILITOT, PROT, ALBUMIN,  in the last 168 hours No results found for this basename: LIPASE, AMYLASE,  in the last 168 hours No results found for this basename: AMMONIA,  in the last 168 hours CBC:  Recent Labs Lab 04/10/14 0818 04/11/14 1205  WBC 6.8  --   NEUTROABS 3.2  --   HGB 12.3 12.9  HCT 38.0 38.0  MCV 106.7*  --   PLT 221  --    Cardiac Enzymes: No results found for this basename: CKTOTAL, CKMB, CKMBINDEX, TROPONINI,  in the last 168 hours  BNP (last 3 results)  Recent Labs  10/06/13 0334 10/21/13 2037 11/08/13 1903  PROBNP 31834.0* 23932.0* 42892.0*   CBG: No results found for this basename: GLUCAP,  in the last 168 hours  Radiological Exams on Admission: No results found.  EKG: NSR, no evidence of acute ischemia  Assessment/Plan Active Problems:   HIV infection   ESRD (end stage renal disease)   Hypertension   CHF (congestive heart failure)   Shortness of breath   Hyperkalemia  ---  Hyperkalemia secondary to not attending dialysis.   ESRD -  Nephrology consulted -  Plan for HD -  CM/SW consults to find outpatient hemodialysis center that will take her  -  Continue renvela  HTN, uncontrolled, likely from inadequate HD -  Continue norvasc -  Increase carvedilol -  Add prn hydralazine  Chronic diastolic heart failure with mild LVH, EF 55-60%, grade 1  DD -  Volume management with HD  HIV, last VL < 20 10/24/2013 and CD4 450 -  Continue lamivudine, raltegravir, and tenovofir, renally dosed  Depression/anxiety, uncontrolled, continue amitriptyline, prozac -  States she has not been taking ativan so will d/c  GERD, stable, continue PPI  Morbidly obese  Chronic pain due to arthritis, with increased left foot pain likely due to arthritis, continue diclofenac and oxycodone -  Uric acid from 2 months ago was wnl -  Consider XR if not improved with hemodialysis -  Continue colace, senna, and miralax for constipation prevention -  PT/OT consults  Polysubstance abuse, including cocaine, marijuana, and cigarettes -  Counseled cessation  Homeless/living with aunt without a set-up HD center -  SW consult  Diet:  renal Access:  PIV IVF:  off Proph:  heparin  Code Status: full Family Communication: patient alone Disposition Plan: Admit to med-surg  Time spent: 60 min Georgian Mcclory [facility] Pager [phone]  If 7PM-7AM, please contact night-coverage [email or web address] Password [email or web address] 04/11/2014, 4:32 PM

## 2014-04-11 NOTE — ED Notes (Signed)
Pt refused IV. Dialysis physician spoke with charge RN on 6E and they are ok with pt not having an IV.

## 2014-04-11 NOTE — ED Provider Notes (Signed)
CSN: [id number]     Arrival date & time 04/11/14  1023 History   First MD Initiated Contact with Patient 04/11/14 1114     Chief Complaint  Patient presents with  . Vascular Access Problem     (Consider location/radiation/quality/duration/timing/severity/associated sxs/prior Treatment) HPI Patient presents to the emergency department questing dialysis.  The patient, states, that [place] in [place], [place], and she did not have a ride to get to the facility.  The patient, states, that she feels more swelling in her legs and abdomen.  The patient, states, that she knows that she needs dialysis.  Patient denies chest pain, nausea, vomiting, diarrhea, headache, blurred vision, weakness, dizziness, back pain, fever, cough, or syncope.  Patient, states states she has slight shortness of breath Past Medical History  Diagnosis Date  . Hypertension   . HIV infection   . Morbid obesity   . ESRD (end stage renal disease) on dialysis 09/30/2013    [place] dialysis in [place], [place] around 2009.  ESRD due to HTN vs drug abuse according to pt.  Was [facility] dialysis [facility] until Feb 2015 when she was admitted to a SNF due to homelessness and drug abuse.  Then changed to [facility] on TTS schedule.     . Anginal pain   . CHF (congestive heart failure)   . Shortness of breath   . Pneumonia   . Depression   . Anxiety   . GERD (gastroesophageal reflux disease)   . Arthritis   . Anemia    Past Surgical History  Procedure Laterality Date  . Arteriovenous graft placement      left forearm  . Cardiac catheterization    . Laparotomy      states seh was cut open because seh was bleeding on the inside   Family History  Problem Relation Age of Onset  . Kidney failure Other     niece  . High blood pressure    . Lung cancer    . Breast cancer Neg Hx   . Colon cancer Neg Hx   . Stroke Mother   . HIV/AIDS Brother     died of AIDS   History  Substance Use Topics  .  Smoking status: Current Every Day Smoker -- 0.50 packs/day    Types: Cigarettes  . Smokeless tobacco: Not on file  . Alcohol Use: No   OB History   Grav Para Term Preterm Abortions TAB SAB Ect Mult Living                 Review of Systems   All other systems negative except as documented in the HPI. All pertinent positives and negatives as reviewed in the HPI. Allergies  Tramadol and Morphine and related  Home Medications   Prior to Admission medications   Medication Sig Start Date End Date Taking? Authorizing Provider  amitriptyline (ELAVIL) 50 MG tablet Take 50 mg by mouth at bedtime.   Yes Historical Provider, MD  amLODipine (NORVASC) 5 MG tablet Take 1 tablet (5 mg total) by mouth at bedtime. 10/28/13  Yes Elease EtienneAnand D Hongalgi, MD  aspirin EC 81 MG EC tablet Take 1 tablet (81 mg total) by mouth daily. 11/12/13  Yes Belkys A Regalado, MD  Aspirin-Salicylamide-Caffeine (BC HEADACHE PO) Take 2 packets by mouth 2 (two) times daily as needed. pain   Yes Historical Provider, MD  carvedilol (COREG) 3.125 MG tablet Take 1 tablet (3.125 mg total) by mouth 2 (  two) times daily with a meal. 10/28/13  Yes Elease EtienneAnand D Hongalgi, MD  diclofenac sodium (VOLTAREN) 1 % GEL Apply 4 g topically 4 (four) times daily.   Yes Historical Provider, MD  diphenhydrAMINE (BENADRYL) 50 MG capsule Take 1 capsule (50 mg total) by mouth at bedtime. 10/28/13  Yes Elease EtienneAnand D Hongalgi, MD  FLUoxetine (PROZAC) 20 MG capsule Take 1 capsule (20 mg total) by mouth daily. 10/28/13  Yes Elease EtienneAnand D Hongalgi, MD  HYDROcodone-acetaminophen (NORCO/VICODIN) 5-325 MG per tablet Take 1 tablet by mouth every 4 (four) hours as needed for moderate pain.   Yes Historical Provider, MD  hydrOXYzine (ATARAX/VISTARIL) 25 MG tablet Take 1 tablet (25 mg total) by mouth 3 (three) times daily. 10/28/13  Yes Elease EtienneAnand D Hongalgi, MD  lamiVUDine (EPIVIR) 10 MG/ML solution Take 5 mLs (50 mg total) by mouth daily. 10/28/13  Yes Elease EtienneAnand D Hongalgi, MD  multivitamin  (RENA-VIT) TABS tablet Take 1 tablet by mouth at bedtime. 10/28/13  Yes Elease EtienneAnand D Hongalgi, MD  omeprazole (PRILOSEC) 20 MG capsule Take 20 mg by mouth daily.   Yes Historical Provider, MD  oxyCODONE (OXY IR/ROXICODONE) 5 MG immediate release tablet Take 1 tablet (5 mg total) by mouth every 6 (six) hours as needed for moderate pain. 01/20/14  Yes Marianne L York, PA-C  polyethylene glycol (MIRALAX / GLYCOLAX) packet Take 17 g by mouth daily.   Yes Historical Provider, MD  Protein POWD Take 1 scoop by mouth 3 (three) times daily. Mixed with applesauce   Yes Historical Provider, MD  raltegravir (ISENTRESS) 400 MG tablet Take 400 mg by mouth 2 (two) times daily.    Yes Historical Provider, MD  senna-docusate (SENOKOT-S) 8.6-50 MG per tablet Take 2 tablets by mouth daily.   Yes Historical Provider, MD  sevelamer carbonate (RENVELA) 800 MG tablet Take 2 tablets (1,600 mg total) by mouth 2 (two) times daily as needed (with snacks for high phosphorus). 01/20/14  Yes Tora KindredMarianne L York, PA-C  tenofovir (VIREAD) 300 MG tablet Take 300 mg by mouth every Friday. Takes on Fridays.   Yes Historical Provider, MD   BP 177/88  Pulse 91  Temp(Src) 97.5 F (36.4 C) (Oral)  Resp 23  SpO2 98% Physical Exam  Nursing note and vitals reviewed. Constitutional: She is oriented to person, place, and time. She appears well-developed and well-nourished. No distress.  HENT:  Head: Normocephalic and atraumatic.  Mouth/Throat: Oropharynx is clear and moist.  Eyes: Pupils are equal, round, and reactive to light.  Neck: Normal range of motion. Neck supple.  Cardiovascular: Normal rate, regular rhythm and normal heart sounds.  Exam reveals no gallop and no friction rub.   No murmur heard. Pulmonary/Chest: Effort normal and breath sounds normal. No respiratory distress.  Neurological: She is alert and oriented to person, place, and time. She exhibits normal muscle tone. Coordination normal.  Skin: Skin is warm and dry.    ED  Course  Procedures (including critical care time) Labs Review Labs Reviewed  I-STAT CHEM 8, ED - Abnormal; Notable for the following:    Sodium 136 (*)    Potassium 5.4 (*)    BUN 56 (*)    Creatinine, Ser 15.60 (*)    Calcium, Ion 1.00 (*)    All other components within normal limits    I spoke with the, [facility] to admit the patient to the hospital.  The patient is stable at this time.   Carlyle DollyChristopher W Ellanore Vanhook, PA-C 04/11/14 1450

## 2014-04-11 NOTE — ED Notes (Signed)
PT requested to speak to SW. SW contacted and Pt informed SW will come to her room .

## 2014-04-11 NOTE — Consult Note (Signed)
[facility] Renal Consultation Note    Indication for Consultation:  Management of ESRD/hemodialysis; anemia, hypertension/volume and secondary hyperparathyroidism  HPI: [name] is a 63 y.o. female with ESRD secondary to DM on HD since 07/2010 with hx of multiple dialysis centers due to crack use, homelessness, intermittent placements.  She has previously dialyzed with [facility] in [place], [place] and presently [place]. She has a history of crack use, emotionally labile and medical/dialysis noncompliance.  The police were called to her center for disruptive behavior 6/25 due to loud yelling and swearing.  She moved to [place] to live with her relative without having made arrangements to transfer to a hemodialysis unit in [place].  Due to her disruptive behavior, [facility] has declined to accept her.  She tells me she moved from a "group home" in [place] yesterday because of the substandard care they provided and they took all of her check except $60. When she was discharged from [facility] in April she went to [facility] but subsequently moved elsewhere since that time.  She states that she hasn't used drugs in a year but can't given up smoking. She admits to having anger issues and just flies off the handle at times.  She has bilateral LE leg pain and left leg swelling, reports SOB, but no CP. She presented here yesterday for HD but have no indication for acute dialysis.  Labs today K 5.4 Cr 15 (istat). No N, V ,D, fever or chills.  Past Medical History  Diagnosis Date  . Hypertension   . HIV infection   . Morbid obesity   . ESRD (end stage renal disease) on dialysis 09/30/2013    Started dialysis in [place], [place] around 2009.  ESRD due to HTN vs drug abuse according to pt.  Was on dialysis at [facility] until Feb 2015 when she was admitted to a SNF due to homelessness and drug abuse.  Then changed to [facility] on TTS schedule.     . Anginal pain   . CHF  (congestive heart failure)   . Shortness of breath   . Pneumonia   . Depression   . Anxiety   . GERD (gastroesophageal reflux disease)   . Arthritis   . Anemia    Past Surgical History  Procedure Laterality Date  . Arteriovenous graft placement      left forearm  . Cardiac catheterization    . Laparotomy      states seh was cut open because seh was bleeding on the inside   Family History  Problem Relation Age of Onset  . Kidney failure Other     niece  . High blood pressure    . Lung cancer    . Breast cancer Neg Hx   . Colon cancer Neg Hx   . Stroke Mother   . HIV/AIDS Brother     died of AIDS   Social History:  reports that she has been smoking Cigarettes.  She has been smoking about 0.50 packs per day. She does not have any smokeless tobacco history on file. She reports that she uses illicit drugs (Marijuana and "Crack" cocaine) about once per week. She reports that she does not drink alcohol. Allergies  Allergen Reactions  . Tramadol     "Gives me bad dreams"  . Morphine And Related Hives and Rash   Prior to Admission medications   Medication Sig Start Date End Date Taking? Authorizing Provider  amitriptyline (ELAVIL) 50 MG tablet  Take 50 mg by mouth at bedtime.   Yes Historical Provider, MD  amLODipine (NORVASC) 5 MG tablet Take 1 tablet (5 mg total) by mouth at bedtime. 10/28/13  Yes Elease Etienne, MD  aspirin EC 81 MG EC tablet Take 1 tablet (81 mg total) by mouth daily. 11/12/13  Yes Belkys A Regalado, MD  Aspirin-Salicylamide-Caffeine (BC HEADACHE PO) Take 2 packets by mouth 2 (two) times daily as needed. pain   Yes Historical Provider, MD  carvedilol (COREG) 3.125 MG tablet Take 1 tablet (3.125 mg total) by mouth 2 (two) times daily with a meal. 10/28/13  Yes Elease Etienne, MD  diclofenac sodium (VOLTAREN) 1 % GEL Apply 4 g topically 4 (four) times daily.   Yes Historical Provider, MD  diphenhydrAMINE (BENADRYL) 50 MG capsule Take 1 capsule (50 mg total) by  mouth at bedtime. 10/28/13  Yes Elease Etienne, MD  FLUoxetine (PROZAC) 20 MG capsule Take 1 capsule (20 mg total) by mouth daily. 10/28/13  Yes Elease Etienne, MD  HYDROcodone-acetaminophen (NORCO/VICODIN) 5-325 MG per tablet Take 1 tablet by mouth every 4 (four) hours as needed for moderate pain.   Yes Historical Provider, MD  hydrOXYzine (ATARAX/VISTARIL) 25 MG tablet Take 1 tablet (25 mg total) by mouth 3 (three) times daily. 10/28/13  Yes Elease Etienne, MD  lamiVUDine (EPIVIR) 10 MG/ML solution Take 5 mLs (50 mg total) by mouth daily. 10/28/13  Yes Elease Etienne, MD  multivitamin (RENA-VIT) TABS tablet Take 1 tablet by mouth at bedtime. 10/28/13  Yes Elease Etienne, MD  omeprazole (PRILOSEC) 20 MG capsule Take 20 mg by mouth daily.   Yes Historical Provider, MD  oxyCODONE (OXY IR/ROXICODONE) 5 MG immediate release tablet Take 1 tablet (5 mg total) by mouth every 6 (six) hours as needed for moderate pain. 01/20/14  Yes Marianne L York, PA-C  polyethylene glycol (MIRALAX / GLYCOLAX) packet Take 17 g by mouth daily.   Yes Historical Provider, MD  Protein POWD Take 1 scoop by mouth 3 (three) times daily. Mixed with applesauce   Yes Historical Provider, MD  raltegravir (ISENTRESS) 400 MG tablet Take 400 mg by mouth 2 (two) times daily.    Yes Historical Provider, MD  senna-docusate (SENOKOT-S) 8.6-50 MG per tablet Take 2 tablets by mouth daily.   Yes Historical Provider, MD  sevelamer carbonate (RENVELA) 800 MG tablet Take 2 tablets (1,600 mg total) by mouth 2 (two) times daily as needed (with snacks for high phosphorus). 01/20/14  Yes Tora Kindred York, PA-C  tenofovir (VIREAD) 300 MG tablet Take 300 mg by mouth every Friday. Takes on Fridays.   Yes Historical Provider, MD   No current facility-administered medications for this encounter.   Current Outpatient Prescriptions  Medication Sig Dispense Refill  . amitriptyline (ELAVIL) 50 MG tablet Take 50 mg by mouth at bedtime.      Marland Kitchen amLODipine  (NORVASC) 5 MG tablet Take 1 tablet (5 mg total) by mouth at bedtime.  30 tablet  0  . aspirin EC 81 MG EC tablet Take 1 tablet (81 mg total) by mouth daily.  30 tablet  0  . Aspirin-Salicylamide-Caffeine (BC HEADACHE PO) Take 2 packets by mouth 2 (two) times daily as needed. pain      . carvedilol (COREG) 3.125 MG tablet Take 1 tablet (3.125 mg total) by mouth 2 (two) times daily with a meal.  60 tablet  0  . diclofenac sodium (VOLTAREN) 1 % GEL Apply 4 g topically  4 (four) times daily.      . diphenhydrAMINE (BENADRYL) 50 MG capsule Take 1 capsule (50 mg total) by mouth at bedtime.  30 capsule  0  . FLUoxetine (PROZAC) 20 MG capsule Take 1 capsule (20 mg total) by mouth daily.  30 capsule  0  . HYDROcodone-acetaminophen (NORCO/VICODIN) 5-325 MG per tablet Take 1 tablet by mouth every 4 (four) hours as needed for moderate pain.      . hydrOXYzine (ATARAX/VISTARIL) 25 MG tablet Take 1 tablet (25 mg total) by mouth 3 (three) times daily.  45 tablet  0  . lamiVUDine (EPIVIR) 10 MG/ML solution Take 5 mLs (50 mg total) by mouth daily.  240 mL  0  . multivitamin (RENA-VIT) TABS tablet Take 1 tablet by mouth at bedtime.  30 tablet  0  . omeprazole (PRILOSEC) 20 MG capsule Take 20 mg by mouth daily.      Marland Kitchen. oxyCODONE (OXY IR/ROXICODONE) 5 MG immediate release tablet Take 1 tablet (5 mg total) by mouth every 6 (six) hours as needed for moderate pain.  30 tablet  0  . polyethylene glycol (MIRALAX / GLYCOLAX) packet Take 17 g by mouth daily.      . Protein POWD Take 1 scoop by mouth 3 (three) times daily. Mixed with applesauce      . raltegravir (ISENTRESS) 400 MG tablet Take 400 mg by mouth 2 (two) times daily.       Marland Kitchen. senna-docusate (SENOKOT-S) 8.6-50 MG per tablet Take 2 tablets by mouth daily.      . sevelamer carbonate (RENVELA) 800 MG tablet Take 2 tablets (1,600 mg total) by mouth 2 (two) times daily as needed (with snacks for high phosphorus).      Marland Kitchen. tenofovir (VIREAD) 300 MG tablet Take 300 mg by  mouth every Friday. Takes on Fridays.       Labs: Basic Metabolic Panel:  Recent Labs Lab 04/10/14 0818 04/11/14 1205  NA 137 136*  K 4.8 5.4*  CL 94* 100  CO2 25  --   GLUCOSE 88 96  BUN 44* 56*  CREATININE 12.23* 15.60*  CALCIUM 8.1*  --   PHOS 3.3  --   CBC:  Recent Labs Lab 04/10/14 0818 04/11/14 1205  WBC 6.8  --   NEUTROABS 3.2  --   HGB 12.3 12.9  HCT 38.0 38.0  MCV 106.7*  --   PLT 221  --    ROS: As per HPI otherwise negative.  Physical Exam: Filed Vitals:   04/11/14 1130 04/11/14 1200 04/11/14 1235 04/11/14 1245  BP: 187/96  183/104 184/106  Pulse: 97 96  91  Temp:      TempSrc:      Resp: 18 21 18 16   SpO2: 95% 98% 97% 98%     General: Well developed, obese breathing easily on room air requesting a lunch tray. Head: Normocephalic, atraumatic, sclera non-icteric, mucus membranes are moist Neck: Supple. JVD not elevated. Lungs: Clear bilaterally to auscultation without wheezes, rales, or rhonchi. Breathing is unlabored. Heart: RRR with S1 S2. No murmurs, rubs, or gallops appreciated. Abdomen: Soft, non-tender, non-distended with normoactive bowel sounds. No rebound/guarding.  M-S:  Strength and tone appear normal for age. Lower extremities: + generalized edema L> R no ischemic changes, no open wounds - tender to palpation bilaterally Neuro: Alert and oriented X 3. Moves all extremities spontaneously. Psych:  Responds to questions appropriately with a normal affect. Dialysis Access: left AVF proximal stitch in area of hematoma (had  a f'gram but no intervention recently sound like Dr. Driscilla Grammesew;s office)  Dialysis Orders:  [facility]  4 hr Optiflux 180 400 800 EDW 110 2 K 2.25 Ca no profile AVF Aranesp 25 per week last Hgb 12.2  6/25 Hectorol 7 iPTH 790 declining  heparin 7000 venofer 50 per week   Assessment/Plan: 1. Hyperkalemia due to missed HD last HD 6/30 - also noncompliant with HD that day - arrived late only had 3 of her usual 4  hours 2. ESRD -  TTS [facility] - she moved from [place] to [place] without making prior arrangements after being told specifically by myself 6/25. Plan HD today 3. Hypertension/volume  - weight pre HD - had been getting to EDW with BP still up will lower EDW- has extra volume on exam, but don't know what weight is - sats on on room air 4. Anemia  -  Hgb 12 - hold ESA 5. Metabolic bone disease -  Continue hectorol 6. Nutrition - renal diet -  7. Depression with emotional lability/history of disruptive behavior - on on prozac 8. Substance abuse  9. HIV+ per primary  Sheffield SliderMartha B Bergman, PA-C [facility] Beeper [phone] 04/11/2014, 1:41 PM   Pt seen, examined and agree w A/P as above.  Vinson Moselleob Jadamarie Butson MD pager [phone]    cell [phone] 04/11/2014, 4:14 PM

## 2014-04-11 NOTE — Progress Notes (Signed)
Weekend ED CSW contacted hemodialysis nursing secretary Darel HongJudy regarding need for transfer of dialysis services to [place].   Samuella BruinKristin Desare Duddy, MSW, LCSWA Clinical Social Worker [facility]. [phone]

## 2014-04-12 DIAGNOSIS — Z91199 Patient's noncompliance with other medical treatment and regimen due to unspecified reason: Secondary | ICD-10-CM

## 2014-04-12 DIAGNOSIS — E8779 Other fluid overload: Secondary | ICD-10-CM | POA: Diagnosis not present

## 2014-04-12 DIAGNOSIS — Z9119 Patient's noncompliance with other medical treatment and regimen: Secondary | ICD-10-CM

## 2014-04-12 DIAGNOSIS — R0602 Shortness of breath: Secondary | ICD-10-CM | POA: Diagnosis not present

## 2014-04-12 LAB — RENAL FUNCTION PANEL
ALBUMIN: 3.4 g/dL — AB (ref 3.5–5.2)
ANION GAP: 17 — AB (ref 5–15)
BUN: 24 mg/dL — ABNORMAL HIGH (ref 6–23)
CO2: 25 mEq/L (ref 19–32)
Calcium: 8.6 mg/dL (ref 8.4–10.5)
Chloride: 94 mEq/L — ABNORMAL LOW (ref 96–112)
Creatinine, Ser: 7.7 mg/dL — ABNORMAL HIGH (ref 0.50–1.10)
GFR calc Af Amer: 6 mL/min — ABNORMAL LOW (ref >90)
GFR calc non Af Amer: 5 mL/min — ABNORMAL LOW (ref >90)
Glucose, Bld: 82 mg/dL (ref 70–99)
POTASSIUM: 4.3 meq/L (ref 3.7–5.3)
Phosphorus: 4.4 mg/dL (ref 2.3–4.6)
SODIUM: 136 meq/L — AB (ref 137–147)

## 2014-04-12 MED ORDER — CARVEDILOL 12.5 MG PO TABS
12.5000 mg | ORAL_TABLET | Freq: Two times a day (BID) | ORAL | Status: DC
Start: 2014-04-12 — End: 2014-04-12
  Administered 2014-04-12: 12.5 mg via ORAL
  Filled 2014-04-12 (×3): qty 1

## 2014-04-12 MED ORDER — CARVEDILOL 6.25 MG PO TABS
6.2500 mg | ORAL_TABLET | Freq: Two times a day (BID) | ORAL | Status: DC
  Filled 2014-04-12 (×3): qty 1

## 2014-04-12 MED ORDER — AMLODIPINE BESYLATE 10 MG PO TABS
10.0000 mg | ORAL_TABLET | Freq: Every day | ORAL | Status: DC
  Filled 2014-04-12: qty 1

## 2014-04-12 NOTE — Discharge Summary (Signed)
Physician Discharge Summary  Jean CooleyMargaret Ann [name] WUJ:[id number]:[id number] DOB: 1951/07/05 DOA: 04/11/2014  PCP: No PCP Per Patient  Admit date: 04/11/2014 Patient left AMA on: 04/12/2014  Patient left against medical advice.  Patient seen by OT who recommended SNF or 24 hour supervision which family cannot provide.  Patient refused SNF.  We were unable to set up home health services, equipment, or transportation services before she left.    Discharge Diagnoses:  Active Problems:   HIV infection   ESRD (end stage renal disease)   Hypertension   CHF (congestive heart failure)   Shortness of breath   Hyperkalemia   Discharge Condition: stable  Diet recommendation: renal  Wt Readings from Last 3 Encounters:  04/11/14 111.2 kg (245 lb 2.4 oz)  01/20/14 112 kg (246 lb 14.6 oz)  11/18/13 110.9 kg (244 lb 7.8 oz)    History of present illness:  The patient is a 63 y.o. year-old female with history of hypertension, congestive heart failure, HIV, end-stage renal disease on hemodialysis, currently no outpatient hemodialysis center set up, depression/anxiety, acid reflux, arthritis who presents with shortness of breath and swelling. The patient states she was last at [facility] but she left for numerous reasons, including inadequate food and toilet paper and rude staff. She blames the staff there for not arranging home health services and a new outpatient HD center for her before she left. Last HD was on Tuesday. She presented to the ER yesterday for dialysis, however, she was turned away because she did not need urgent HD. She represented today with mild SOB and some mild swelling of the left leg. Currently, she is denying feeling SOB. She denies chest pain. She was found to be mildly hyperkalemic and is being admitted for HD. SW has already been trying to arrange for HD center and transportation.   In the emergency department, her hemoglobin was 12.9, sodium 136, potassium 5.4, BUN 56, creatinine 15.6. Her  vital signs were notable for blood pressure of 198/108. She is being admitted for hemodialysis and to have R. Child psychotherapistsocial worker and case manager assist her in finding outpatient hemodialysis center.    Hospital Course:   Hyperkalemia, resolved with HD on day of admission.  ESRD, seen by Nephrology and she underwent HD on 7/4. CM/SW attempted to confirm outpatient hemodialysis center that will take her, confirm bed space at [place] old center in [place], and transportation to [place] until a new center can be found which could not be done on the weekend.  Patient initially amenable to staying until arrangements could be made, however, she changed her mind this afternoon, cursed at some of the hospital staff, and decided to leave AMA.  I spoke to her to try to convince her to stay until tomorrow so that we could ensure she had maximal resources for home and to absolutely confirm a plan for HD (particularly because of her unpredictable behaviors, poor compliance, and lack of follow up), however, she adamantly refused.    HTN, uncontrolled, likely from inadequate HD.  Increased norvasc and carvedilol while inpatient, however, not given Rx at discharge as left AMA.    Chronic diastolic heart failure with mild LVH, EF 55-60%, grade 1 DD, volume managed with HD.  HIV, last VL < 20 10/24/2013 and CD4 450.  Continued lamivudine, raltegravir, and tenovofir, renally dosed.  Depression/anxiety, uncontrolled, continued amitriptyline, prozac.  GERD, stable, continue PPI  Morbidly obese  Chronic pain due to arthritis, with increased left foot pain which improved post  HD.  Continued diclofenac and oxycodone.  Uric acid from 2 months ago was wnl.  Continued colace, senna, and miralax for constipation prevention.   Polysubstance abuse, including cocaine, marijuana, and cigarettes.  Counseled cessation.  Homeless/living with family member temporarily without a set-up HD center.  See above.   Consultants:   Nephrology Procedures:  CXR HD Antibiotics:  None    Discharge Exam: Filed Vitals:   04/12/14 1118  BP: 170/109  Pulse: 76  Temp:   Resp:    Filed Vitals:   04/11/14 2202 04/12/14 0432 04/12/14 1045 04/12/14 1118  BP: 163/96 152/89 171/102 170/109  Pulse: 99 92 99 76  Temp: 98.9 F (37.2 C) 97.8 F (36.6 C) 98.6 F (37 C)   TempSrc: Oral Oral Oral   Resp: 18 18 19    Height:      Weight:      SpO2: 95% 95% 96%     General: Obese BF, No acute distress  HEENT: NCAT, MMM  Cardiovascular: RRR, nl S1, S2 no mrg, 2+ pulses, warm extremities  Respiratory: CTAB, no increased WOB  Abdomen: NABS, soft, NT/ND  MSK: Normal tone and bulk, no LEE, left foot no longer TTP  Neuro: Grossly intact   Discharge Instructions     Medication List    ASK your doctor about these medications       amitriptyline 50 MG tablet  Commonly known as:  ELAVIL  Take 50 mg by mouth at bedtime.     amLODipine 5 MG tablet  Commonly known as:  NORVASC  Take 1 tablet (5 mg total) by mouth at bedtime.     aspirin 81 MG EC tablet  Take 1 tablet (81 mg total) by mouth daily.     BC HEADACHE PO  Take 2 packets by mouth 2 (two) times daily as needed. pain     carvedilol 3.125 MG tablet  Commonly known as:  COREG  Take 1 tablet (3.125 mg total) by mouth 2 (two) times daily with a meal.     diclofenac sodium 1 % Gel  Commonly known as:  VOLTAREN  Apply 4 g topically 4 (four) times daily.     diphenhydrAMINE 50 MG capsule  Commonly known as:  BENADRYL  Take 1 capsule (50 mg total) by mouth at bedtime.     FLUoxetine 20 MG capsule  Commonly known as:  PROZAC  Take 1 capsule (20 mg total) by mouth daily.     HYDROcodone-acetaminophen 5-325 MG per tablet  Commonly known as:  NORCO/VICODIN  Take 1 tablet by mouth every 4 (four) hours as needed for moderate pain.     hydrOXYzine 25 MG tablet  Commonly known as:  ATARAX/VISTARIL  Take 1 tablet (25 mg total) by mouth 3 (three) times  daily.     lamiVUDine 10 MG/ML solution  Commonly known as:  EPIVIR  Take 5 mLs (50 mg total) by mouth daily.     multivitamin Tabs tablet  Take 1 tablet by mouth at bedtime.     omeprazole 20 MG capsule  Commonly known as:  PRILOSEC  Take 20 mg by mouth daily.     oxyCODONE 5 MG immediate release tablet  Commonly known as:  Oxy IR/ROXICODONE  Take 1 tablet (5 mg total) by mouth every 6 (six) hours as needed for moderate pain.     polyethylene glycol packet  Commonly known as:  MIRALAX / GLYCOLAX  Take 17 g by mouth daily.     Protein  Powd  Take 1 scoop by mouth 3 (three) times daily. Mixed with applesauce     raltegravir 400 MG tablet  Commonly known as:  ISENTRESS  Take 400 mg by mouth 2 (two) times daily.     senna-docusate 8.6-50 MG per tablet  Commonly known as:  Senokot-S  Take 2 tablets by mouth daily.     sevelamer carbonate 800 MG tablet  Commonly known as:  RENVELA  Take 2 tablets (1,600 mg total) by mouth 2 (two) times daily as needed (with snacks for high phosphorus).     tenofovir 300 MG tablet  Commonly known as:  VIREAD  Take 300 mg by mouth every Friday. Takes on Fridays.          The results of significant diagnostics from this hospitalization (including imaging, microbiology, ancillary and laboratory) are listed below for reference.    Significant Diagnostic Studies: No results found.  Microbiology: No results found for this or any previous visit (from the past 240 hour(s)).   Labs: Basic Metabolic Panel:  Recent Labs Lab 04/10/14 0818 04/11/14 1205 04/12/14 0510  NA 137 136* 136*  K 4.8 5.4* 4.3  CL 94* 100 94*  CO2 25  --  25  GLUCOSE 88 96 82  BUN 44* 56* 24*  CREATININE 12.23* 15.60* 7.70*  CALCIUM 8.1*  --  8.6  MG 2.5  --   --   PHOS 3.3  --  4.4   Liver Function Tests:  Recent Labs Lab 04/12/14 0510  ALBUMIN 3.4*   No results found for this basename: LIPASE, AMYLASE,  in the last 168 hours No results found for  this basename: AMMONIA,  in the last 168 hours CBC:  Recent Labs Lab 04/10/14 0818 04/11/14 1205  WBC 6.8  --   NEUTROABS 3.2  --   HGB 12.3 12.9  HCT 38.0 38.0  MCV 106.7*  --   PLT 221  --    Cardiac Enzymes: No results found for this basename: CKTOTAL, CKMB, CKMBINDEX, TROPONINI,  in the last 168 hours BNP: BNP (last 3 results)  Recent Labs  10/06/13 0334 10/21/13 2037 11/08/13 1903  PROBNP 31834.0* 23932.0* 42892.0*   CBG: No results found for this basename: GLUCAP,  in the last 168 hours  Time coordinating discharge: 45 minutes  Signed:  Montoya Brandel  [facility] 04/12/2014, 3:03 PM

## 2014-04-12 NOTE — Progress Notes (Signed)
Pt asked to see MD, wanted to go home, she signed paper to d/c against medical advice. Assisted via wheelchair to meet her ride.

## 2014-04-12 NOTE — Evaluation (Signed)
Occupational Therapy Evaluation Patient Details Name: Jean CooleyMargaret Ann [name] MRN: [id number] DOB: Feb 15, 1951 Today's Date: 04/12/2014    History of Present Illness This 63 y.o. female presented to ED with SOB and mild swelling.  She missed her HD apt and was found to be hyperkalemic.  She was admitted for HD.  PMH includes HTN, CHF; HIV+; ESRD; depression/anxiety; h/o polysubstance abuse, arthritis hips and knees.   Clinical Impression   Pt admitted with above. She demonstrates the below listed deficits and will benefit from continued OT to maximize safety and independence with BADLs.  Eval limited by elevated BP, but anticipate she is very close to her baseline status.  She reports she recently moved in with her niece and niece's family who have been assisting her as able.  She reports she uses a manual w/c to move around the house.  She left her electric chair and RW at a homeless shelter (she has also lost the charger to her power chair, and is therefore unable to move it).  She has two steps to enter the apt and struggles with this (family assists).  She is requesting a CAP worker.   At one point she asked about SNF's in [place] that she could go to (instructed her to speak with SW), but later in conversation she is insistent that she is discharging home to niece's today.  Optimally feel best discharge plan for this pt is SNF as it appears per chart review and by her history that she has been transient.   However, doubt she will agree and may not qualify.  Therefore, recommend HHOT, HHPT HHaide, HHSW.  She needs a RW; however, unsure that her insurance will reimburse for another as she just recently received the one she left at the homeless shelter.  She refuses BSC.      Follow Up Recommendations  SNF;Supervision/Assistance - 24 hour.  Pt likely will refuse SNF, therefore, recommend HHOT, HHPT, HHaide, HHSW.  Pt is requesting CAPS services.   Equipment Recommendations  RW; however, pt reports  recently receiving RW and leaving it at homeless shelter.  Unsure if she can get a second one.    Recommendations for Other Services       Precautions / Restrictions Precautions Precautions: Fall      Mobility Bed Mobility Overal bed mobility: Modified Independent             General bed mobility comments: pt is insistent on elevating HOB  Transfers                 General transfer comment: Not tested due to elevated BP    Balance                                            ADL Overall ADL's : Needs assistance/impaired Eating/Feeding: Independent;Bed level   Grooming: Wash/dry hands;Wash/dry face;Oral care;Applying deodorant;Brushing hair;Set up;Bed level   Upper Body Bathing: Set up;Bed level   Lower Body Bathing: Set up;Bed level   Upper Body Dressing : Set up;Bed level   Lower Body Dressing: Set up;Bed level;Sitting/lateral leans                 General ADL Comments: Pt bathed supine.  Pt moved to EOB with complaint of feeling dizzy and headache.  RN present and checked BP 170/107.  Pt returned to supine.  Pt with multiple  questions, and requests.  She requests CAPS worker and for her HD to be switched to later in the am.  She was also asking about SNF availability in [place] - instructed her to discuss with SW.  Later in conversation, she states she is planning to discharge home Today and is adamant about discharging home to niece's home.  She is requesting a Consulting civil engineer for her electric w/c - instructed her to contact the company from whom she purchased w/c.  Discussed with pt that she doesn't have ramp access into apt and that she can not use electric w/c outisde the apt at this time (she had not considered this).  Discussed 3in1 for home use, but pt does not want - she prefers to continue as she has been.  She also reports she will need to discuss receiving HH therapies with her niece     Vision                     Perception      Praxis      Pertinent Vitals/Pain BP 170/107 - RN aware.  See vitals flow sheet for pain      Hand Dominance Right   Extremity/Trunk Assessment Upper Extremity Assessment Upper Extremity Assessment: Overall WFL for tasks assessed   Lower Extremity Assessment Lower Extremity Assessment: Defer to PT evaluation       Communication Communication Communication: No difficulties   Cognition Arousal/Alertness: Awake/alert Behavior During Therapy: WFL for tasks assessed/performed Overall Cognitive Status: No family/caregiver present to determine baseline cognitive functioning       Memory: Decreased short-term memory             General Comments       Exercises       Shoulder Instructions      Home Living Family/patient expects to be discharged to:: Private residence   Available Help at Discharge: Family;Available PRN/intermittently Type of Home: Apartment Home Access: Stairs to enter Entrance Stairs-Number of Steps: 2   Home Layout: One level     Bathroom Shower/Tub: Chief Strategy Officer: Standard     Home Equipment: Wheelchair - manual   Additional Comments: Pt reports she recently was at  [facility] in [place], but discharged herself to a homeless shelter.  She then moved in with her niece ~5-7 days ago.  Her niece works third shift and she and family have been providing assist to pt.  Pt reports that she left her electric w/c and RW at shelter and that the power cord is missing for her w/c and she therefore can't move it.        Prior Functioning/Environment Level of Independence: Needs assistance  Gait / Transfers Assistance Needed: Pt uses manual wheelchair in the home and ambulates short distances with help from family (she does not currently have RW).  She reports she has been having difficulty negotiating the 2 steps into the apt.  ADL's / Homemaking Assistance Needed: Pt reports she has been able to perform sponge bath at bed level.  She  requires assist for dressing and IADLs.  She reports she parks her wheelchair in front of bathroom door an ambulates into BR holding onto walls, door, and sink         OT Diagnosis: Generalized weakness   OT Problem List: Decreased strength;Decreased activity tolerance;Impaired balance (sitting and/or standing);Decreased safety awareness;Decreased knowledge of use of DME or AE;Obesity   OT Treatment/Interventions: Self-care/ADL training;DME and/or AE instruction;Therapeutic activities;Balance training;Patient/family education  OT Goals(Current goals can be found in the care plan section) Acute Rehab OT Goals Patient Stated Goal: to go home OT Goal Formulation: With patient Time For Goal Achievement: 04/19/14 Potential to Achieve Goals: Good ADL Goals Pt Will Perform Grooming: with min guard assist;standing Pt Will Perform Lower Body Bathing: with min guard assist;sit to/from stand Pt Will Perform Lower Body Dressing: with min guard assist;sit to/from stand Pt Will Transfer to Toilet: with min guard assist;ambulating;regular height toilet;grab bars Pt Will Perform Toileting - Clothing Manipulation and hygiene: with min guard assist;sit to/from stand Pt Will Perform Tub/Shower Transfer: Tub transfer;with min guard assist;ambulating;tub bench;rolling walker  OT Frequency: Min 2X/week   Barriers to D/C: Decreased caregiver support          Co-evaluation              End of Session Nurse Communication: Patient requests pain meds  Activity Tolerance: Treatment limited secondary to medical complications (Comment) (elevated BP) Patient left: in bed;with call bell/phone within reach;with nursing/sitter in room   Time: 1041-1116 OT Time Calculation (min): 35 min Charges:  OT General Charges $OT Visit: 1 Procedure OT Evaluation $Initial OT Evaluation Tier I: 1 Procedure OT Treatments $Self Care/Home Management : 23-37 mins G-Codes: OT G-codes **NOT FOR INPATIENT  CLASS** Functional Limitation: Self care Self Care Current Status ([id number]): At least 20 percent but less than 40 percent impaired, limited or restricted Self Care Goal Status (U0454(G8988): At least 20 percent but less than 40 percent impaired, limited or restricted  Chivas Notz M 04/12/2014, 11:39 AM

## 2014-04-12 NOTE — Progress Notes (Signed)
[facility] PROGRESS NOTE  Jean CooleyMargaret Ann [name] ZOX:[id number] DOB: [birth year] DOA: 04/11/2014 PCP: No PCP Per Patient  Assessment/Plan  Hyperkalemia and resolved after HD yesterday  ESRD  - Appreciate Nephrology assistance - Plan for HD  - CM/SW consults to find outpatient hemodialysis center that will take her  - Continue renvela   HTN, uncontrolled, likely from inadequate HD  - Increase norvasc  - Increase carvedilol  - Continue prn hydralazine   Chronic diastolic heart failure with mild LVH, EF 55-60%, grade 1 DD  - Volume management with HD   HIV, last VL < 20 10/24/2013 and CD4 450  - Continue lamivudine, raltegravir, and tenovofir, renally dosed   Depression/anxiety, uncontrolled, continue amitriptyline, prozac   GERD, stable, continue PPI   Morbidly obese   Chronic pain due to arthritis, with increased left foot pain which improved post HD. -  Continue diclofenac and oxycodone  - Uric acid from 2 months ago was wnl  - Continue colace, senna, and miralax for constipation prevention  - PT/OT consults   Polysubstance abuse, including cocaine, marijuana, and cigarettes  - Counseled cessation   Homeless/living with aunt without a set-up HD center  - SW consult  Diet: renal  Access: PIV  IVF: off  Proph: heparin   Code Status: full  Family Communication: patient alone  Disposition Plan:  Possibly home tomorrow if CM/SW able to 1. Establish PCP, 2. Arrange home health services, 3. Make sure she has outpatient HD set up, 4. Make sure she has transportation for HD  Consultants:  Nephrology  Procedures:  CXR  Antibiotics:  None   HPI/Subjective:  Feels much better this morning.  Denies SOB and her left foot is feeling much better.  Objective: Filed Vitals:   04/11/14 2105 04/11/14 2125 04/11/14 2202 04/12/14 0432  BP: 185/103 156/86 163/96 152/89  Pulse: 88 104 99 92  Temp:  98.3 F (36.8 C) 98.9 F (37.2 C) 97.8 F (36.6 C)  TempSrc:   Oral Oral Oral  Resp:  20 18 18   Height:      Weight: 111.2 kg (245 lb 2.4 oz)     SpO2:  99% 95% 95%    Intake/Output Summary (Last 24 hours) at 04/12/14 0910 Last data filed at 04/11/14 2105  Gross per 24 hour  Intake      0 ml  Output   3626 ml  Net  -3626 ml   Filed Weights   04/11/14 1607 04/11/14 1702 04/11/14 2105  Weight: 114.4 kg (252 lb 3.3 oz) 114.1 kg (251 lb 8.7 oz) 111.2 kg (245 lb 2.4 oz)    Exam:   General:  Obese BF, No acute distress  HEENT:  NCAT, MMM  Cardiovascular:  RRR, nl S1, S2 no mrg, 2+ pulses, warm extremities  Respiratory:  CTAB, no increased WOB  Abdomen:   NABS, soft, NT/ND  MSK:   Normal tone and bulk, no LEE, left foot no longer TTP  Neuro:  Grossly intact  Data Reviewed: Basic Metabolic Panel:  Recent Labs Lab 04/10/14 0818 04/11/14 1205 04/12/14 0510  NA 137 136* 136*  K 4.8 5.4* 4.3  CL 94* 100 94*  CO2 25  --  25  GLUCOSE 88 96 82  BUN 44* 56* 24*  CREATININE 12.23* 15.60* 7.70*  CALCIUM 8.1*  --  8.6  MG 2.5  --   --   PHOS 3.3  --  4.4   Liver Function Tests:  Recent Labs Lab 04/12/14 0510  ALBUMIN 3.4*   No results found for this basename: LIPASE, AMYLASE,  in the last 168 hours No results found for this basename: AMMONIA,  in the last 168 hours CBC:  Recent Labs Lab 04/10/14 0818 04/11/14 1205  WBC 6.8  --   NEUTROABS 3.2  --   HGB 12.3 12.9  HCT 38.0 38.0  MCV 106.7*  --   PLT 221  --    Cardiac Enzymes: No results found for this basename: CKTOTAL, CKMB, CKMBINDEX, TROPONINI,  in the last 168 hours BNP (last 3 results)  Recent Labs  10/06/13 0334 10/21/13 2037 11/08/13 1903  PROBNP 31834.0* 23932.0* 42892.0*   CBG: No results found for this basename: GLUCAP,  in the last 168 hours  No results found for this or any previous visit (from the past 240 hour(s)).   Studies: No results found.  Scheduled Meds: . amitriptyline  50 mg Oral QHS  . amLODipine  10 mg Oral QHS  . aspirin EC   81 mg Oral Daily  . carvedilol  12.5 mg Oral BID WC  . diclofenac sodium  4 g Topical QID  . [START ON 04/14/2014] doxercalciferol  7 mcg Intravenous Q T,Th,Sa-HD  . [START ON 04/14/2014] ferric gluconate (FERRLECIT/NULECIT) IV  125 mg Intravenous Q Tue-HD  . FLUoxetine  20 mg Oral Daily  . heparin  5,000 Units Subcutaneous 3 times per day  . hydrOXYzine  25 mg Oral TID  . lamiVUDine  50 mg Oral Daily  . multivitamin  1 tablet Oral QHS  . pantoprazole  40 mg Oral Daily  . polyethylene glycol  17 g Oral Daily  . raltegravir  400 mg Oral BID  . senna-docusate  2 tablet Oral Daily  . [START ON 04/17/2014] tenofovir  300 mg Oral Q Fri   Continuous Infusions:   Active Problems:   HIV infection   ESRD (end stage renal disease)   Hypertension   CHF (congestive heart failure)   Shortness of breath   Hyperkalemia    Time spent: 30 min    Paralee Pendergrass, [facility]  [facility] Pager [phone]. If 7PM-7AM, please contact night-coverage at [email or web address], password [email or web address] 04/12/2014, 9:10 AM  LOS: 1 day

## 2014-04-12 NOTE — Progress Notes (Signed)
PT Cancellation Note  Patient Details Name: [name] MRN: [id number] DOB: 28-Feb-1951   Cancelled Treatment:    Reason Eval/Treat Not Completed: Medical issues which prohibited therapy Noted High BP, at which case we will hold mobility and activity;  Will follow up for PT eval tomorrow;    Van ClinesGarrigan, [facility] 04/12/2014, 2:56 PM

## 2014-04-12 NOTE — Progress Notes (Signed)
[facility] Progress Note  Assessment/Plan: 1. Hyperkalemia due to missed HD last HD 6/30 - also noncompliant with HD that day - arrived late only had 3 of her usual 4 hours K corrected 5.4 - 4.3 with HD 2. ESRD - [facility] - [place] without making prior arrangements after being told specifically by myself 6/25.Had HD 7/4 - next due Tuesday; working to see if there is a unit that will accept her in [place]. Her prior behavior has burned some bridges.  3. Hypertension/volume - weight pre HD - had been getting to EDW with BP ^; net UF 3.6 with post Wt 111.2 - need to continue to challenge edw - didn't have any cramping with HD Saturday 4. Anemia - Hgb 12 - holding ESA 5. Metabolic bone disease - Continue hectorol 6. Nutrition - renal diet -  7. Depression with emotional lability/history of disruptive behavior - on on prozac 8. Substance abuse/tobacco abuse 9. HIV+ per primary 10. Hx of medical and dialysis noncompliance  Sheffield SliderMartha B Bergman, PA-C [facility] Beeper [phone] 04/12/2014,8:19 AM  LOS: 1 day   Pt seen, examined and agree w A/P as above.  Patient had HD yesterday, signed off 20 min early.  She is close to her dry wt, electrolytes stable.  No reason I can see she could not be dc'd.  She says she has family that is going to help her with transortation to her HD unit in [place] while the process of [place] in [place] takes place.  Vinson Moselleob Herchel Hopkin MD pager [phone]    cell [phone] 04/12/2014, 9:14 AM    Subjective:   Will you get me some more coffee?  Took off clothes because she felt hot. Legs don't hut today--they have less swelling  Objective Filed Vitals:   04/11/14 2105 04/11/14 2125 04/11/14 2202 04/12/14 0432  BP: 185/103 156/86 163/96 152/89  Pulse: 88 104 99 92  Temp:  98.3 F (36.8 C) 98.9 F (37.2 C) 97.8 F (36.6 C)  TempSrc:  Oral Oral Oral  Resp:  20 18 18   Height:      Weight: 111.2 kg  (245 lb 2.4 oz)     SpO2:  99% 95% 95%   Physical Exam General: NAD supine in bed Heart: RRR Lungs: coarse BS Abdomen: obese soft + BS Extremities: no edema Dialysis Access: left AVF + bruit  Dialysis Orders: [facility] 4 hr Optiflux 180 400 800 EDW 110 2 K 2.25 Ca no profile AVF Aranesp 25 per week last Hgb 12.2 6/25 Hectorol 7 iPTH 790 declining heparin 7000 venofer 50 per week   Additional Objective Labs: Basic Metabolic Panel:  Recent Labs Lab 04/10/14 0818 04/11/14 1205 04/12/14 0510  NA 137 136* 136*  K 4.8 5.4* 4.3  CL 94* 100 94*  CO2 25  --  25  GLUCOSE 88 96 82  BUN 44* 56* 24*  CREATININE 12.23* 15.60* 7.70*  CALCIUM 8.1*  --  8.6  PHOS 3.3  --  4.4   Liver Function Tests:  Recent Labs Lab 04/12/14 0510  ALBUMIN 3.4*   CBC:  Recent Labs Lab 04/10/14 0818 04/11/14 1205  WBC 6.8  --   NEUTROABS 3.2  --   HGB 12.3 12.9  HCT 38.0 38.0  MCV 106.7*  --   PLT 221  --   Medications:   . amitriptyline  50 mg Oral QHS  . amLODipine  10 mg Oral  QHS  . aspirin EC  81 mg Oral Daily  . carvedilol  12.5 mg Oral BID WC  . diclofenac sodium  4 g Topical QID  . [START ON 04/14/2014] doxercalciferol  7 mcg Intravenous Q T,Th,Sa-HD  . [START ON 04/14/2014] ferric gluconate (FERRLECIT/NULECIT) IV  125 mg Intravenous Q Tue-HD  . FLUoxetine  20 mg Oral Daily  . heparin  5,000 Units Subcutaneous 3 times per day  . hydrOXYzine  25 mg Oral TID  . lamiVUDine  50 mg Oral Daily  . multivitamin  1 tablet Oral QHS  . pantoprazole  40 mg Oral Daily  . polyethylene glycol  17 g Oral Daily  . raltegravir  400 mg Oral BID  . senna-docusate  2 tablet Oral Daily  . [START ON 04/17/2014] tenofovir  300 mg Oral Q Fri

## 2014-04-12 NOTE — Progress Notes (Signed)
Weekend CSW attempted to contact patient's dialysis center [facility] in [place] [phone] and [facility] [employer] [phone], both offices closed. CSW will continue to follow to assist with discharge planning as needed.   Samuella BruinKristin Elisia Stepp, MSW, LCSWA Clinical Social Worker [facility]. [phone]

## 2014-04-14 ENCOUNTER — Encounter (HOSPITAL_COMMUNITY): Payer: Self-pay | Admitting: Emergency Medicine

## 2014-04-14 ENCOUNTER — Emergency Department (HOSPITAL_COMMUNITY)
Admission: EM | Admit: 2014-04-14 | Discharge: 2014-04-14 | Disposition: A | Discharge status: Home / Self Care | Payer: Medicare Other | Source: Home / Self Care | Attending: Emergency Medicine | Admitting: Emergency Medicine

## 2014-04-14 DIAGNOSIS — Z79899 Other long term (current) drug therapy: Secondary | ICD-10-CM | POA: Insufficient documentation

## 2014-04-14 DIAGNOSIS — R269 Unspecified abnormalities of gait and mobility: Secondary | ICD-10-CM | POA: Insufficient documentation

## 2014-04-14 DIAGNOSIS — K219 Gastro-esophageal reflux disease without esophagitis: Secondary | ICD-10-CM

## 2014-04-14 DIAGNOSIS — I509 Heart failure, unspecified: Secondary | ICD-10-CM

## 2014-04-14 DIAGNOSIS — M1990 Unspecified osteoarthritis, unspecified site: Secondary | ICD-10-CM

## 2014-04-14 DIAGNOSIS — F17200 Nicotine dependence, unspecified, uncomplicated: Secondary | ICD-10-CM | POA: Insufficient documentation

## 2014-04-14 DIAGNOSIS — Z992 Dependence on renal dialysis: Secondary | ICD-10-CM

## 2014-04-14 DIAGNOSIS — F329 Major depressive disorder, single episode, unspecified: Secondary | ICD-10-CM | POA: Insufficient documentation

## 2014-04-14 DIAGNOSIS — R079 Chest pain, unspecified: Secondary | ICD-10-CM

## 2014-04-14 DIAGNOSIS — Z9889 Other specified postprocedural states: Secondary | ICD-10-CM | POA: Insufficient documentation

## 2014-04-14 DIAGNOSIS — Z862 Personal history of diseases of the blood and blood-forming organs and certain disorders involving the immune mechanism: Secondary | ICD-10-CM

## 2014-04-14 DIAGNOSIS — IMO0002 Reserved for concepts with insufficient information to code with codable children: Secondary | ICD-10-CM

## 2014-04-14 DIAGNOSIS — Z7982 Long term (current) use of aspirin: Secondary | ICD-10-CM | POA: Insufficient documentation

## 2014-04-14 DIAGNOSIS — Z8701 Personal history of pneumonia (recurrent): Secondary | ICD-10-CM | POA: Insufficient documentation

## 2014-04-14 DIAGNOSIS — Z21 Asymptomatic human immunodeficiency virus [HIV] infection status: Secondary | ICD-10-CM

## 2014-04-14 DIAGNOSIS — N186 End stage renal disease: Secondary | ICD-10-CM | POA: Insufficient documentation

## 2014-04-14 DIAGNOSIS — I209 Angina pectoris, unspecified: Secondary | ICD-10-CM

## 2014-04-14 DIAGNOSIS — E875 Hyperkalemia: Secondary | ICD-10-CM | POA: Insufficient documentation

## 2014-04-14 DIAGNOSIS — I120 Hypertensive chronic kidney disease with stage 5 chronic kidney disease or end stage renal disease: Secondary | ICD-10-CM

## 2014-04-14 DIAGNOSIS — M79609 Pain in unspecified limb: Secondary | ICD-10-CM

## 2014-04-14 DIAGNOSIS — F411 Generalized anxiety disorder: Secondary | ICD-10-CM | POA: Insufficient documentation

## 2014-04-14 DIAGNOSIS — M1710 Unilateral primary osteoarthritis, unspecified knee: Secondary | ICD-10-CM | POA: Insufficient documentation

## 2014-04-14 DIAGNOSIS — R0602 Shortness of breath: Secondary | ICD-10-CM | POA: Insufficient documentation

## 2014-04-14 DIAGNOSIS — N189 Chronic kidney disease, unspecified: Secondary | ICD-10-CM

## 2014-04-14 DIAGNOSIS — F3289 Other specified depressive episodes: Secondary | ICD-10-CM | POA: Insufficient documentation

## 2014-04-14 LAB — COMPREHENSIVE METABOLIC PANEL
ALT: <5 U/L (ref 0–35)
AST: 14 U/L (ref 0–37)
Albumin: 3.5 g/dL (ref 3.5–5.2)
Alkaline Phosphatase: 328 U/L — ABNORMAL HIGH (ref 39–117)
Anion gap: 21 — ABNORMAL HIGH (ref 5–15)
BUN: 73 mg/dL — ABNORMAL HIGH (ref 6–23)
CHLORIDE: 95 meq/L — AB (ref 96–112)
CO2: 22 mEq/L (ref 19–32)
Calcium: 8.4 mg/dL (ref 8.4–10.5)
Creatinine, Ser: 12.03 mg/dL — ABNORMAL HIGH (ref 0.50–1.10)
GFR, EST AFRICAN AMERICAN: 3 mL/min — AB (ref >90)
GFR, EST NON AFRICAN AMERICAN: 3 mL/min — AB (ref >90)
Glucose, Bld: 128 mg/dL — ABNORMAL HIGH (ref 70–99)
Potassium: 6.4 mEq/L — ABNORMAL HIGH (ref 3.7–5.3)
Sodium: 138 mEq/L (ref 137–147)
Total Protein: 7.6 g/dL (ref 6.0–8.3)

## 2014-04-14 LAB — CBC
HCT: 33.9 % — ABNORMAL LOW (ref 36.0–46.0)
Hemoglobin: 10.8 g/dL — ABNORMAL LOW (ref 12.0–15.0)
MCH: 33.6 pg (ref 26.0–34.0)
MCHC: 31.9 g/dL (ref 30.0–36.0)
MCV: 105.6 fL — AB (ref 78.0–100.0)
PLATELETS: 235 10*3/uL (ref 150–400)
RBC: 3.21 MIL/uL — ABNORMAL LOW (ref 3.87–5.11)
RDW: 13.5 % (ref 11.5–15.5)
WBC: 7.7 10*3/uL (ref 4.0–10.5)

## 2014-04-14 LAB — TROPONIN I

## 2014-04-14 MED ORDER — CLONIDINE HCL 0.2 MG PO TABS
0.3000 mg | ORAL_TABLET | Freq: Once | ORAL | Status: AC
  Administered 2014-04-14: 0.3 mg via ORAL
  Filled 2014-04-14 (×2): qty 1

## 2014-04-14 MED ORDER — LORAZEPAM 2 MG/ML IJ SOLN: 1.0000 mg | Freq: Once | INTRAMUSCULAR | Status: DC

## 2014-04-14 MED ORDER — HYDROCODONE-ACETAMINOPHEN 5-325 MG PO TABS: 1.0000 | ORAL_TABLET | Freq: Four times a day (QID) | ORAL | Status: DC | PRN

## 2014-04-14 MED ORDER — LORAZEPAM 2 MG/ML IJ SOLN
1.0000 mg | Freq: Once | INTRAMUSCULAR | Status: AC
  Administered 2014-04-14: 1 mg via INTRAMUSCULAR
  Filled 2014-04-14: qty 1

## 2014-04-14 MED ORDER — SODIUM POLYSTYRENE SULFONATE 15 GM/60ML PO SUSP
30.0000 g | Freq: Once | ORAL | Status: AC
  Administered 2014-04-14: 30 g via ORAL
  Filled 2014-04-14: qty 120

## 2014-04-14 NOTE — ED Notes (Addendum)
Pt reported a man stuck her and got blood. Spoke with lab about blood they stated they were unable to get blood with the first stick and that they were sending someone else to draw her blood now.

## 2014-04-14 NOTE — Discharge Instructions (Signed)
[name], we checked your electrolytes and noticed you are needing dialysis very soon. We treated your elevated potassium. We also called the kidney doctor who will arrange for dialysis for you tomorrow in [place]. I have prescribed you some pain medication for your arthritis, but you will have to follow-up with your primary care physician. You checked your heart to see if you could be having a heart attack and it does not appear so from the testing.  Dialysis Dialysis is a procedure that replaces some of the work healthy kidneys do. It is done when you lose about 85-90% of your kidney function. It may also be done earlier if your symptoms may be improved by dialysis. During dialysis, wastes, salt, and extra water are removed from the blood, and the levels of certain chemicals in the blood (such as potassium) are maintained. Dialysis is done in sessions. Dialysis sessions are continued until the kidneys get better. If the kidneys cannot get better, such as in end-stage kidney disease, dialysis is continued for life or until you receive a new kidney (kidney transplant). There are two types of dialysis: hemodialysis and peritoneal dialysis. HEMODIALYSIS  Hemodialysis is a type of dialysis in which a machine called a dialyzer is used to filter the blood. Before beginning hemodialysis, you will have surgery to create a site where blood can be removed from the body and returned to the body (vascular access). There are three types of vascular accesses:  Arteriovenous fistula. To create this type of access, an artery is connected to a vein (usually in the arm). A fistula takes 1-6 months to develop after surgery. If it develops properly, it usually lasts longer than the other types of vascular accesses. It is also less likely to become infected and cause blood clots.  Arteriovenous graft. To create this type of access, an artery and a vein in the arm are connected with a tube. A graft may be used within 2-3  weeks of surgery.  A venous catheter. To create this type of access, a thin, flexible tube (catheter) is placed in a large vein in your neck, chest, or groin. A catheter may be used right away. It is usually used as a temporary access when dialysis needs to begin immediately. During hemodialysis, blood leaves the body through your access. It travels through a tube to the dialyzer, where it is filtered. The blood then returns to your body through another tube. Hemodialysis is usually performed by a caregiver at a hospital or dialysis center three times a week. Visits last about 3-4 hours. It may also be performed with the help of another person at home with training.  PERITONEAL DIALYSIS Peritoneal dialysis is a type of dialysis in which the thin lining of the abdomen (peritoneum) is used as a filter. Before beginning peritoneal dialysis, you will have surgery to place a catheter in your abdomen. The catheter will be used to transfer a fluid called dialysate to and from your abdomen. At the start of a session, your abdomen is filled with dialysate. During the session, wastes, salt, and extra water in the blood pass through the peritoneum and into the dialysate. The dialysate is drained from the body at the end of the session. The process of filling and draining the dialysate is called an exchange. Exchanges are repeated until you have used up all the dialysate for the day. Peritoneal dialysis may be performed by you at home or at almost any other location. It is done every day.  You may need up to five exchanges a day. The amount of time the dialysate is in your body between exchanges is called a dwell. The dwell depends on the number of exchanges needed and the characteristics of the peritoneum. It usually varies from 1.5-3 hours. You may go about your day normally between exchanges. Alternately, the exchanges may be done at night while you sleep using a machine called a cycler. CHOOSING HEMODIALYSIS OR  PERITONEAL DIALYSIS  Both hemodialysis and peritoneal dialysis have advantages and disadvantages. Talk to your caregiver about which type of dialysis would be best for you. Your lifestyle and preferences should be considered along with your medical condition. In some cases, only one type of dialysis may be an option.  Advantages of hemodialysis  It is done less often than peritoneal dialysis.  Someone else can do the dialysis for you.  If you go to a dialysis center, your caregiver will be able to recognize any problems right away.  If you go to a dialysis center, you can interact with others who are having dialysis. This can provide you with emotional support. Disadvantages of hemodialysis  Hemodialysis may cause cramps and low blood pressure. It may leave you feeling tired on the days you have the treatment.  If you go to a dialysis center, you will need to make weekly appointments and work around the center's schedule.  You will need to take extra care when traveling. If you go to a dialysis center, you will need to make special arrangements to visit a dialysis center near your destination. If you are having treatments at home, you will need to take the dialyzer with you to your destination.  You will need to avoid more foods than you would need to avoid on peritoneal dialysis. Advantages of peritoneal dialysis  It is less likely than hemodialysis to cause cramps and low blood pressure.  You may do exchanges on your own wherever you are, including when you travel.  You do not need to avoid as many foods as you do on hemodialysis. Disadvantages of Peritoneal Dialysis  It is done more often than hemodialysis.  Performing peritoneal dialysis requires you to have dexterity of the hands. You must also be able to lift bags.  You will have to learn sterilization techniques. You will need to practice them every day to reduce the risk of infection. DIALYSIS DIET Both hemodialysis and  peritoneal dialysis require you to make some changes to your diet. For example, you will need to limit your intake of foods high in the minerals phosphorus and potassium. You will also need to limit your fluid intake. Your dietitian can help you plan meals. A good meal plan can improve your dialysis and your health.  WHAT TO EXPECT  Adjusting to the dialysis treatment, schedule, and diet can take some time. You may need to stop working and may not be able to do some of the things you normally do. You may feel anxious or depressed when beginning dialysis. Eventually, many people feel better overall because of dialysis. Some people are able to return to work after making some changes, such as reducing work intensity. FOR MORE INFORMATION  National Kidney Foundation: www.kidney.org American Association of Kidney Patients: [employer]idential[employer].iswww.aakp.org  American Kidney Fund: www.kidneyfund.org Document Released: 12/16/2002 Document Revised: 05/28/2013 Document Reviewed: 11/19/2012 [facility] Patient Information 2015 [employer] [place]. This information is not intended to replace advice given to you by your health care provider. Make sure you discuss any questions you have with  your health care provider. ° °

## 2014-04-14 NOTE — ED Notes (Signed)
EMT also unable to get blood RN attempted stick and was also unable to get blood. EDP aware and another lab tech coming to stick pt.

## 2014-04-14 NOTE — ED Notes (Signed)
Pt unable to ambulate. She uses a wheelchair at home and does not have it with her. [facility] called.

## 2014-04-14 NOTE — Progress Notes (Signed)
*  Preliminary Results* Bilateral lower extremity venous duplex completed. There is no obvious evidence of deep vein thrombosis of bilateral lower extremities, however the patient was unable to tolerate compression maneuvers throughout most of the exam. Therefore, the study was inconclusive.  Preliminary results discussed with Dr.Nettey.  04/14/2014  Gertie FeyMichelle Arran Fessel, RVT, RDCS, RDMS

## 2014-04-14 NOTE — ED Provider Notes (Signed)
CSN: [id number]     Arrival date & time 04/14/14  1140 History   First MD Initiated Contact with Patient 04/14/14 1150     Chief Complaint  Patient presents with  . Extremity Pain     (Consider location/radiation/quality/duration/timing/severity/associated sxs/prior Treatment) HPI [name] is a 63 y.o. female that presents to the ED for left leg swelling. Patient has history of ESRD on dialysis, CHF, hypertension and morbid obesity. Leg swelling is chronic. She has some associated sharp pain in her shin. She was going to dialysis but could not stand on her leg. She normally uses a wheelchair. She has taken Vicodin and BC powder which has not helped. She previously had sharp chest pain and shortness of breath similar to prior panic attacks. She has ipratropium at home, which she used with no relief of shortness of breath. Symptoms subsided after a few hours. Pain was non-exertional with no radiation. She has nitro at home but did not take any. She also did not take her blood pressure medications today. She has no history of MI, DVT or CVA.   Past Medical History  Diagnosis Date  . Hypertension   . HIV infection   . Morbid obesity   . ESRD (end stage renal disease) on dialysis 09/30/2013    Started dialysis in [place], [place] around 2009.  ESRD due to HTN vs drug abuse according to pt.  Was on dialysis at [facility] until Feb 2015 when she was admitted to a SNF due to homelessness and drug abuse.  Then changed to [facility] on TTS schedule.     . Anginal pain   . CHF (congestive heart failure)   . Shortness of breath   . Pneumonia   . Depression   . Anxiety   . GERD (gastroesophageal reflux disease)   . Arthritis   . Anemia    Past Surgical History  Procedure Laterality Date  . Arteriovenous graft placement      left forearm  . Cardiac catheterization    . Laparotomy      states seh was cut open because seh was bleeding on the inside   Family History  Problem Relation  Age of Onset  . Kidney failure Other     niece  . High blood pressure    . Lung cancer    . Breast cancer Neg Hx   . Colon cancer Neg Hx   . Stroke Mother   . HIV/AIDS Brother     died of AIDS   History  Substance Use Topics  . Smoking status: Current Every Day Smoker -- 0.50 packs/day    Types: Cigarettes  . Smokeless tobacco: Not on file  . Alcohol Use: No   OB History   Grav Para Term Preterm Abortions TAB SAB Ect Mult Living                 Review of Systems  Respiratory: Positive for shortness of breath. Negative for cough and wheezing.   Cardiovascular: Positive for chest pain and leg swelling. Negative for palpitations.  Gastrointestinal: Negative for nausea, vomiting, abdominal pain and diarrhea.  Musculoskeletal: Positive for gait problem. Negative for joint swelling.  Neurological: Negative for headaches.  All other systems reviewed and are negative.     Allergies  Tramadol and Morphine and related  Home Medications   Prior to Admission medications   Medication Sig Start Date End Date Taking? Authorizing Provider  amitriptyline (ELAVIL) 50 MG tablet Take 50  mg by mouth at bedtime.   Yes Historical Provider, MD  amLODipine (NORVASC) 5 MG tablet Take 5 mg by mouth daily.   Yes Historical Provider, MD  aspirin EC 81 MG tablet Take 81 mg by mouth daily.   Yes Historical Provider, MD  Aspirin-Salicylamide-Caffeine (BC HEADACHE PO) Take 2 packets by mouth 2 (two) times daily as needed. pain   Yes Historical Provider, MD  carvedilol (COREG) 3.125 MG tablet Take 3.125 mg by mouth 2 (two) times daily with a meal.   Yes Historical Provider, MD  diclofenac sodium (VOLTAREN) 1 % GEL Apply 4 g topically 4 (four) times daily.   Yes Historical Provider, MD  diphenhydrAMINE (BENADRYL) 50 MG capsule Take 50 mg by mouth at bedtime as needed for sleep.   Yes Historical Provider, MD  FLUoxetine (PROZAC) 20 MG capsule Take 20 mg by mouth daily.   Yes Historical Provider, MD   HYDROcodone-acetaminophen (NORCO/VICODIN) 5-325 MG per tablet Take 1 tablet by mouth every 4 (four) hours as needed for moderate pain.   Yes Historical Provider, MD  hydrOXYzine (ATARAX/VISTARIL) 25 MG tablet Take 25 mg by mouth 3 (three) times daily.   Yes Historical Provider, MD  lamiVUDine (EPIVIR) 10 MG/ML solution Take 50 mg by mouth daily.   Yes Historical Provider, MD  multivitamin (RENA-VIT) TABS tablet Take 1 tablet by mouth daily.   Yes Historical Provider, MD  omeprazole (PRILOSEC) 20 MG capsule Take 20 mg by mouth daily.   Yes Historical Provider, MD  polyethylene glycol (MIRALAX / GLYCOLAX) packet Take 17 g by mouth daily.   Yes Historical Provider, MD  Protein POWD Take 1 scoop by mouth 3 (three) times daily. Mixed with applesauce   Yes Historical Provider, MD  raltegravir (ISENTRESS) 400 MG tablet Take 400 mg by mouth 2 (two) times daily.    Yes Historical Provider, MD  senna-docusate (SENOKOT-S) 8.6-50 MG per tablet Take 2 tablets by mouth daily.   Yes Historical Provider, MD  sevelamer carbonate (RENVELA) 800 MG tablet Take 1,600-2,400 mg by mouth 3 (three) times daily with meals. *takes 2400mg  with meals and 1600mg  with snacks*   Yes Historical Provider, MD  tenofovir (VIREAD) 300 MG tablet Take 300 mg by mouth every Friday. Takes on Fridays.   Yes Historical Provider, MD   BP 139/79  Pulse 96  Temp(Src) 98.4 F (36.9 C) (Oral)  Resp 17  SpO2 100%  Physical Exam  Constitutional: She is oriented to person, place, and time. She appears well-developed and well-nourished.  Eyes: Conjunctivae and EOM are normal. Pupils are equal, round, and reactive to light.  Cardiovascular: Normal rate, regular rhythm, normal heart sounds and intact distal pulses.   No murmur heard. Pulmonary/Chest: Effort normal and breath sounds normal.  Abdominal: Soft. Bowel sounds are normal. She exhibits no distension. There is no tenderness. There is no rebound.  Musculoskeletal:       Right lower  leg: She exhibits edema (trace).       Left lower leg: She exhibits edema (trace).  Neurological: She is alert and oriented to person, place, and time.  Skin: Skin is warm and dry.    ED Course  Procedures (including critical care time) Medications  cloNIDine (CATAPRES) tablet 0.3 mg (0.3 mg Oral Given 04/14/14 1800)  sodium polystyrene (KAYEXALATE) 15 GM/60ML suspension 30 g (30 g Oral Given 04/14/14 1800)  LORazepam (ATIVAN) injection 1 mg (1 mg Intramuscular Given 04/14/14 1823)   Labs Review Labs Reviewed  COMPREHENSIVE METABOLIC PANEL -  Abnormal; Notable for the following:    Potassium 6.4 (*)    Chloride 95 (*)    Glucose, Bld 128 (*)    BUN 73 (*)    Creatinine, Ser 12.03 (*)    Alkaline Phosphatase 328 (*)    Total Bilirubin <0.2 (*)    GFR calc non Af Amer 3 (*)    GFR calc Af Amer 3 (*)    Anion gap 21 (*)    All other components within normal limits  CBC - Abnormal; Notable for the following:    RBC 3.21 (*)    Hemoglobin 10.8 (*)    HCT 33.9 (*)    MCV 105.6 (*)    All other components within normal limits  TROPONIN I    Imaging Review Dg Chest 2 View  04/15/2014   CLINICAL DATA:  Shortness of breath.  Smoker.  EXAM: CHEST  2 VIEW  COMPARISON:  04/11/2014  FINDINGS: Shallow inspiration. The heart size and mediastinal contours are within normal limits. Both lungs are clear. The visualized skeletal structures are unremarkable.  IMPRESSION: No active cardiopulmonary disease.   Electronically Signed   By: Burman NievesWilliam  Stevens M.D.   On: 04/15/2014 05:17     EKG Interpretation   Date/Time:  Tuesday April 14 2014 11:50:42 EDT Ventricular Rate:  101 PR Interval:  201 QRS Duration: 107 QT Interval:  378 QTC Calculation: 490 R Axis:   66 Text Interpretation:  Sinus tachycardia Borderline prolonged QT interval  No significant change since last tracing Confirmed by BEATON  MD, ROBERT  ([id number]) on 04/14/2014 11:56:59 AM      MDM   Final diagnoses:  Hyperkalemia  Chronic  arthritis  Chronic uremia   Patient with complaints of bilateral leg pain. Seems related to arthritis. Bilateral dopplers obtained. Exam intermediate but technician did not visualize DVT. Patient on room air throughout stay with no shortness of breath. Wells DVT score of 0. Patient not tachycardic. Chest pain resolved spontaneously. EKG without ST changes. No significant change from last tracing. Troponin negative x1. Gave ativan for anxiety. Given clonidine 0.3mg  for hypertension which improved to 139/79. Patient understands and agrees with plan. Labs significant for hyperkalemia up to 6.4. Patient gets dialysis in [place] but has had issues with transportation. Spoke with Dr. Marisue HumbleSanford, nephrologist, who recommended kayexalate 30mg  and will arrange for dialysis tomorrow for patient. Kayexalate given and discussed plan with patient. Patient understood and agreed with plan. Patient stable for discharge home. Transportation arranged for patient.   Jacquelin Hawkingalph Joshuwa Vecchio, MD 04/15/14 [phone]

## 2014-04-14 NOTE — ED Notes (Signed)
Pt reports to the ED via [facility] for eval of multiple pain sites. Pt reports generalized body pain. She reports she feels like someone is stabbing her all over. She took a Vicodin and a BC powder with no relief. She reports bilateral foot pain and swelling with the left more swollen than the right. CMS intact and full ROM intact. Pt also reports intermittent sharp chest pain that is worse with deep breaths and movement. Pt sinus tachycardia at a rate of 100s on the monitor. Pt associated symptoms of SOB, N/V, and diaphoresis. Denies any lightheadedness or dizziness. Pt was supposed to have dialysis today but was unable to do so r/t pain. Last dialysis Saturday and she had full tx. Pt hypertensive en route. Pt reports she has no cartilage in her right knee and she is having pain in her right knee and right hip. Pt reports she is wheelchair bound r/t this at baseline. Pt reports all of this pain began at approx 6:30 this am. Pt A&Ox4, resp e/u, and skin warm and dry.

## 2014-04-14 NOTE — ED Notes (Signed)
Pt monitored by pulse ox, bp cuff, and 5-lead(Per nurse).

## 2014-04-14 NOTE — ED Notes (Signed)
Called [facility] for transportation back home 

## 2014-04-15 ENCOUNTER — Inpatient Hospital Stay (HOSPITAL_COMMUNITY)
Admission: EM | Admit: 2014-04-15 | Discharge: 2014-04-17 | DRG: 640 | Discharge status: Against Medical Advice | Payer: Medicare Other | Attending: Internal Medicine | Admitting: Internal Medicine

## 2014-04-15 ENCOUNTER — Encounter (HOSPITAL_COMMUNITY): Payer: Self-pay | Admitting: Emergency Medicine

## 2014-04-15 ENCOUNTER — Emergency Department (HOSPITAL_COMMUNITY): Payer: Medicare Other

## 2014-04-15 DIAGNOSIS — F17200 Nicotine dependence, unspecified, uncomplicated: Secondary | ICD-10-CM | POA: Diagnosis present

## 2014-04-15 DIAGNOSIS — J9601 Acute respiratory failure with hypoxia: Secondary | ICD-10-CM

## 2014-04-15 DIAGNOSIS — I120 Hypertensive chronic kidney disease with stage 5 chronic kidney disease or end stage renal disease: Secondary | ICD-10-CM | POA: Diagnosis present

## 2014-04-15 DIAGNOSIS — Z823 Family history of stroke: Secondary | ICD-10-CM

## 2014-04-15 DIAGNOSIS — R0602 Shortness of breath: Secondary | ICD-10-CM

## 2014-04-15 DIAGNOSIS — E875 Hyperkalemia: Secondary | ICD-10-CM | POA: Diagnosis present

## 2014-04-15 DIAGNOSIS — E8779 Other fluid overload: Principal | ICD-10-CM | POA: Diagnosis present

## 2014-04-15 DIAGNOSIS — M79669 Pain in unspecified lower leg: Secondary | ICD-10-CM

## 2014-04-15 DIAGNOSIS — Z7982 Long term (current) use of aspirin: Secondary | ICD-10-CM

## 2014-04-15 DIAGNOSIS — N186 End stage renal disease: Secondary | ICD-10-CM | POA: Diagnosis present

## 2014-04-15 DIAGNOSIS — Z9115 Patient's noncompliance with renal dialysis: Secondary | ICD-10-CM

## 2014-04-15 DIAGNOSIS — Z885 Allergy status to narcotic agent status: Secondary | ICD-10-CM | POA: Diagnosis not present

## 2014-04-15 DIAGNOSIS — Z21 Asymptomatic human immunodeficiency virus [HIV] infection status: Secondary | ICD-10-CM | POA: Diagnosis present

## 2014-04-15 DIAGNOSIS — F329 Major depressive disorder, single episode, unspecified: Secondary | ICD-10-CM | POA: Diagnosis present

## 2014-04-15 DIAGNOSIS — R4586 Emotional lability: Secondary | ICD-10-CM | POA: Diagnosis present

## 2014-04-15 DIAGNOSIS — F1410 Cocaine abuse, uncomplicated: Secondary | ICD-10-CM | POA: Diagnosis present

## 2014-04-15 DIAGNOSIS — M899 Disorder of bone, unspecified: Secondary | ICD-10-CM | POA: Diagnosis present

## 2014-04-15 DIAGNOSIS — J9690 Respiratory failure, unspecified, unspecified whether with hypoxia or hypercapnia: Secondary | ICD-10-CM | POA: Diagnosis present

## 2014-04-15 DIAGNOSIS — Z992 Dependence on renal dialysis: Secondary | ICD-10-CM

## 2014-04-15 DIAGNOSIS — K219 Gastro-esophageal reflux disease without esophagitis: Secondary | ICD-10-CM | POA: Diagnosis present

## 2014-04-15 DIAGNOSIS — Z5900 Homelessness unspecified: Secondary | ICD-10-CM | POA: Diagnosis not present

## 2014-04-15 DIAGNOSIS — F411 Generalized anxiety disorder: Secondary | ICD-10-CM | POA: Diagnosis present

## 2014-04-15 DIAGNOSIS — F3289 Other specified depressive episodes: Secondary | ICD-10-CM | POA: Diagnosis present

## 2014-04-15 DIAGNOSIS — M949 Disorder of cartilage, unspecified: Secondary | ICD-10-CM

## 2014-04-15 DIAGNOSIS — M129 Arthropathy, unspecified: Secondary | ICD-10-CM | POA: Diagnosis present

## 2014-04-15 DIAGNOSIS — Z9119 Patient's noncompliance with other medical treatment and regimen: Secondary | ICD-10-CM

## 2014-04-15 DIAGNOSIS — I509 Heart failure, unspecified: Secondary | ICD-10-CM | POA: Diagnosis present

## 2014-04-15 DIAGNOSIS — Z888 Allergy status to other drugs, medicaments and biological substances status: Secondary | ICD-10-CM | POA: Diagnosis not present

## 2014-04-15 DIAGNOSIS — J441 Chronic obstructive pulmonary disease with (acute) exacerbation: Secondary | ICD-10-CM | POA: Diagnosis present

## 2014-04-15 DIAGNOSIS — Z91199 Patient's noncompliance with other medical treatment and regimen due to unspecified reason: Secondary | ICD-10-CM | POA: Diagnosis not present

## 2014-04-15 DIAGNOSIS — D649 Anemia, unspecified: Secondary | ICD-10-CM | POA: Diagnosis present

## 2014-04-15 DIAGNOSIS — N2581 Secondary hyperparathyroidism of renal origin: Secondary | ICD-10-CM | POA: Diagnosis present

## 2014-04-15 DIAGNOSIS — Z6841 Body Mass Index (BMI) 40.0 and over, adult: Secondary | ICD-10-CM | POA: Diagnosis not present

## 2014-04-15 DIAGNOSIS — Z91158 Patient's noncompliance with renal dialysis for other reason: Secondary | ICD-10-CM

## 2014-04-15 DIAGNOSIS — J9600 Acute respiratory failure, unspecified whether with hypoxia or hypercapnia: Secondary | ICD-10-CM | POA: Diagnosis present

## 2014-04-15 DIAGNOSIS — I159 Secondary hypertension, unspecified: Secondary | ICD-10-CM

## 2014-04-15 DIAGNOSIS — B20 Human immunodeficiency virus [HIV] disease: Secondary | ICD-10-CM | POA: Diagnosis present

## 2014-04-15 DIAGNOSIS — Z993 Dependence on wheelchair: Secondary | ICD-10-CM | POA: Diagnosis not present

## 2014-04-15 DIAGNOSIS — F1210 Cannabis abuse, uncomplicated: Secondary | ICD-10-CM | POA: Diagnosis present

## 2014-04-15 LAB — CBC WITH DIFFERENTIAL/PLATELET
BASOS ABS: 0 10*3/uL (ref 0.0–0.1)
BASOS PCT: 0 % (ref 0–1)
Eosinophils Absolute: 0.2 10*3/uL (ref 0.0–0.7)
Eosinophils Relative: 2 % (ref 0–5)
HCT: 31.5 % — ABNORMAL LOW (ref 36.0–46.0)
Hemoglobin: 10.1 g/dL — ABNORMAL LOW (ref 12.0–15.0)
Lymphocytes Relative: 25 % (ref 12–46)
Lymphs Abs: 1.9 10*3/uL (ref 0.7–4.0)
MCH: 34 pg (ref 26.0–34.0)
MCHC: 32.1 g/dL (ref 30.0–36.0)
MCV: 106.1 fL — ABNORMAL HIGH (ref 78.0–100.0)
Monocytes Absolute: 0.7 10*3/uL (ref 0.1–1.0)
Monocytes Relative: 9 % (ref 3–12)
NEUTROS ABS: 4.9 10*3/uL (ref 1.7–7.7)
NEUTROS PCT: 64 % (ref 43–77)
Platelets: 222 10*3/uL (ref 150–400)
RBC: 2.97 MIL/uL — ABNORMAL LOW (ref 3.87–5.11)
RDW: 13.9 % (ref 11.5–15.5)
WBC: 7.6 10*3/uL (ref 4.0–10.5)

## 2014-04-15 LAB — TROPONIN I
Troponin I: <0.3 ng/mL (ref <0.30)
Troponin I: <0.3 ng/mL (ref <0.30)

## 2014-04-15 LAB — BASIC METABOLIC PANEL
ANION GAP: 22 — AB (ref 5–15)
BUN: 82 mg/dL — ABNORMAL HIGH (ref 6–23)
CALCIUM: 8.6 mg/dL (ref 8.4–10.5)
CHLORIDE: 95 meq/L — AB (ref 96–112)
CO2: 20 mEq/L (ref 19–32)
Creatinine, Ser: 12.57 mg/dL — ABNORMAL HIGH (ref 0.50–1.10)
GFR calc non Af Amer: 3 mL/min — ABNORMAL LOW (ref >90)
GFR, EST AFRICAN AMERICAN: 3 mL/min — AB (ref >90)
Glucose, Bld: 125 mg/dL — ABNORMAL HIGH (ref 70–99)
Potassium: 5.7 mEq/L — ABNORMAL HIGH (ref 3.7–5.3)
SODIUM: 137 meq/L (ref 137–147)

## 2014-04-15 LAB — MRSA PCR SCREENING: MRSA by PCR: NEGATIVE

## 2014-04-15 MED ORDER — SEVELAMER CARBONATE 800 MG PO TABS
1600.0000 mg | ORAL_TABLET | ORAL | Status: DC | PRN
  Filled 2014-04-15: qty 2

## 2014-04-15 MED ORDER — CARVEDILOL 3.125 MG PO TABS
3.1250 mg | ORAL_TABLET | Freq: Two times a day (BID) | ORAL | Status: DC
  Administered 2014-04-15: 3.125 mg via ORAL
  Filled 2014-04-15 (×5): qty 1

## 2014-04-15 MED ORDER — HYDROCODONE-ACETAMINOPHEN 5-325 MG PO TABS
2.0000 | ORAL_TABLET | Freq: Once | ORAL | Status: AC
  Administered 2014-04-15: 2 via ORAL
  Filled 2014-04-15: qty 2

## 2014-04-15 MED ORDER — FLUOXETINE HCL 20 MG PO CAPS
20.0000 mg | ORAL_CAPSULE | Freq: Every day | ORAL | Status: DC
  Administered 2014-04-15 – 2014-04-17 (×2): 20 mg via ORAL
  Filled 2014-04-15 (×3): qty 1

## 2014-04-15 MED ORDER — TENOFOVIR DISOPROXIL FUMARATE 300 MG PO TABS
300.0000 mg | ORAL_TABLET | ORAL | Status: DC
  Administered 2014-04-17: 300 mg via ORAL
  Filled 2014-04-15: qty 1

## 2014-04-15 MED ORDER — DARBEPOETIN ALFA-POLYSORBATE 25 MCG/0.42ML IJ SOLN
25.0000 ug | INTRAMUSCULAR | Status: DC
  Administered 2014-04-15: 25 ug via INTRAVENOUS
  Filled 2014-04-15: qty 0.42

## 2014-04-15 MED ORDER — CLONIDINE HCL 0.1 MG PO TABS
0.1000 mg | ORAL_TABLET | Freq: Once | ORAL | Status: AC
  Administered 2014-04-16: 0.1 mg via ORAL
  Filled 2014-04-15: qty 1

## 2014-04-15 MED ORDER — SODIUM CHLORIDE 0.9 % IV SOLN
250.0000 mL | INTRAVENOUS | Status: DC | PRN
  Administered 2014-04-16: 100 mL via INTRAVENOUS

## 2014-04-15 MED ORDER — SENNOSIDES-DOCUSATE SODIUM 8.6-50 MG PO TABS
2.0000 | ORAL_TABLET | Freq: Every day | ORAL | Status: DC
  Administered 2014-04-17: 2 via ORAL
  Filled 2014-04-15 (×2): qty 2

## 2014-04-15 MED ORDER — SODIUM CHLORIDE 0.9 % IJ SOLN
3.0000 mL | Freq: Two times a day (BID) | INTRAMUSCULAR | Status: DC
  Administered 2014-04-15: 3 mL via INTRAVENOUS

## 2014-04-15 MED ORDER — ONDANSETRON HCL 4 MG PO TABS: 4.0000 mg | ORAL_TABLET | Freq: Four times a day (QID) | ORAL | Status: DC | PRN

## 2014-04-15 MED ORDER — SEVELAMER CARBONATE 800 MG PO TABS: 1600.0000 mg | ORAL_TABLET | Freq: Three times a day (TID) | ORAL | Status: DC

## 2014-04-15 MED ORDER — SODIUM CHLORIDE 0.9 % IV SOLN
62.5000 mg | INTRAVENOUS | Status: DC
  Filled 2014-04-15 (×2): qty 5

## 2014-04-15 MED ORDER — ASPIRIN EC 81 MG PO TBEC
81.0000 mg | DELAYED_RELEASE_TABLET | Freq: Every day | ORAL | Status: DC
  Administered 2014-04-15 – 2014-04-17 (×2): 81 mg via ORAL
  Filled 2014-04-15 (×3): qty 1

## 2014-04-15 MED ORDER — HYDRALAZINE HCL 20 MG/ML IJ SOLN: 10.0000 mg | Freq: Four times a day (QID) | INTRAMUSCULAR | Status: DC | PRN

## 2014-04-15 MED ORDER — AMLODIPINE BESYLATE 5 MG PO TABS
5.0000 mg | ORAL_TABLET | Freq: Every day | ORAL | Status: DC
  Administered 2014-04-15: 5 mg via ORAL
  Filled 2014-04-15 (×2): qty 1

## 2014-04-15 MED ORDER — HYDROCODONE-ACETAMINOPHEN 5-325 MG PO TABS
1.0000 | ORAL_TABLET | Freq: Four times a day (QID) | ORAL | Status: DC | PRN
  Administered 2014-04-15 – 2014-04-16 (×3): 1 via ORAL
  Filled 2014-04-15 (×4): qty 1

## 2014-04-15 MED ORDER — IPRATROPIUM-ALBUTEROL 0.5-2.5 (3) MG/3ML IN SOLN
3.0000 mL | Freq: Four times a day (QID) | RESPIRATORY_TRACT | Status: DC | PRN
  Administered 2014-04-16: 3 mL via RESPIRATORY_TRACT
  Filled 2014-04-15: qty 3

## 2014-04-15 MED ORDER — ACETAMINOPHEN 325 MG PO TABS: 650.0000 mg | ORAL_TABLET | Freq: Four times a day (QID) | ORAL | Status: DC | PRN

## 2014-04-15 MED ORDER — ACETAMINOPHEN 650 MG RE SUPP: 650.0000 mg | Freq: Four times a day (QID) | RECTAL | Status: DC | PRN

## 2014-04-15 MED ORDER — OXYCODONE HCL 5 MG PO TABS
5.0000 mg | ORAL_TABLET | Freq: Once | ORAL | Status: AC
  Administered 2014-04-16: 5 mg via ORAL
  Filled 2014-04-15 (×2): qty 1

## 2014-04-15 MED ORDER — ONDANSETRON HCL 4 MG/2ML IJ SOLN: 4.0000 mg | Freq: Four times a day (QID) | INTRAMUSCULAR | Status: DC | PRN

## 2014-04-15 MED ORDER — LAMIVUDINE 10 MG/ML PO SOLN
50.0000 mg | Freq: Every day | ORAL | Status: DC
  Administered 2014-04-15 – 2014-04-17 (×2): 50 mg via ORAL
  Filled 2014-04-15 (×3): qty 5

## 2014-04-15 MED ORDER — POLYETHYLENE GLYCOL 3350 17 G PO PACK
17.0000 g | PACK | Freq: Every day | ORAL | Status: DC | PRN
Start: 2014-04-15 — End: 2014-04-17
  Filled 2014-04-15: qty 1

## 2014-04-15 MED ORDER — SEVELAMER CARBONATE 800 MG PO TABS
2400.0000 mg | ORAL_TABLET | Freq: Three times a day (TID) | ORAL | Status: DC
  Administered 2014-04-16 – 2014-04-17 (×3): 2400 mg via ORAL
  Filled 2014-04-15 (×9): qty 3

## 2014-04-15 MED ORDER — RALTEGRAVIR POTASSIUM 400 MG PO TABS
400.0000 mg | ORAL_TABLET | Freq: Two times a day (BID) | ORAL | Status: DC
  Administered 2014-04-15 – 2014-04-17 (×4): 400 mg via ORAL
  Filled 2014-04-15 (×7): qty 1

## 2014-04-15 MED ORDER — DICLOFENAC SODIUM 1 % TD GEL
4.0000 g | Freq: Four times a day (QID) | TRANSDERMAL | Status: DC | PRN
  Administered 2014-04-17: 4 g via TOPICAL
  Filled 2014-04-15: qty 100

## 2014-04-15 MED ORDER — SODIUM CHLORIDE 0.9 % IJ SOLN: 3.0000 mL | INTRAMUSCULAR | Status: DC | PRN

## 2014-04-15 MED ORDER — DOXERCALCIFEROL 4 MCG/2ML IV SOLN
7.0000 ug | INTRAVENOUS | Status: DC
  Administered 2014-04-16: 7 ug via INTRAVENOUS
  Filled 2014-04-15: qty 4

## 2014-04-15 MED ORDER — DIPHENHYDRAMINE HCL 25 MG PO CAPS
50.0000 mg | ORAL_CAPSULE | Freq: Every evening | ORAL | Status: DC | PRN
  Administered 2014-04-15 – 2014-04-16 (×2): 50 mg via ORAL
  Filled 2014-04-15 (×2): qty 2

## 2014-04-15 MED ORDER — HEPARIN SODIUM (PORCINE) 5000 UNIT/ML IJ SOLN
5000.0000 [IU] | Freq: Three times a day (TID) | INTRAMUSCULAR | Status: DC
  Administered 2014-04-15 – 2014-04-17 (×4): 5000 [IU] via SUBCUTANEOUS
  Filled 2014-04-15 (×11): qty 1

## 2014-04-15 MED ORDER — HYDROXYZINE HCL 25 MG PO TABS: 25.0000 mg | ORAL_TABLET | Freq: Three times a day (TID) | ORAL | Status: DC | PRN

## 2014-04-15 MED ORDER — RENA-VITE PO TABS
1.0000 | ORAL_TABLET | Freq: Every day | ORAL | Status: DC
  Administered 2014-04-15 – 2014-04-16 (×2): 1 via ORAL
  Filled 2014-04-15 (×3): qty 1

## 2014-04-15 MED ORDER — DARBEPOETIN ALFA-POLYSORBATE 25 MCG/0.42ML IJ SOLN
INTRAMUSCULAR | Status: AC
  Filled 2014-04-15: qty 0.42

## 2014-04-15 MED ORDER — AMITRIPTYLINE HCL 50 MG PO TABS
50.0000 mg | ORAL_TABLET | Freq: Every day | ORAL | Status: DC
  Administered 2014-04-15 – 2014-04-16 (×2): 50 mg via ORAL
  Filled 2014-04-15 (×5): qty 1

## 2014-04-15 MED ORDER — PANTOPRAZOLE SODIUM 40 MG PO TBEC
40.0000 mg | DELAYED_RELEASE_TABLET | Freq: Every day | ORAL | Status: DC
  Administered 2014-04-15 – 2014-04-17 (×2): 40 mg via ORAL
  Filled 2014-04-15 (×3): qty 1

## 2014-04-15 NOTE — ED Notes (Signed)
Patient refused to take Vicodin.  "I got some of them at home".

## 2014-04-15 NOTE — Progress Notes (Signed)
Patient pulled out her IV access and has refused a new access. NP on call notified.

## 2014-04-15 NOTE — ED Notes (Signed)
Called to patient bedside. Patient persistently asking for pain medication to help left foot pain. Informed patient that at this time MD Norlene CampbellOtter does not feel comfortable treating her pain until workup for shortness of breath is complete. Patient continues to request pain medication. Spoke with MD Norlene Campbelltter about pain medication and MD affirms plan. Offered patient hot or cold therapy patient stated that heat will help. Hot packs applied. Patient states intervention "helped a lot".

## 2014-04-15 NOTE — Procedures (Signed)
Tolerating hemodialysis without any hemodynamic issues. Talor Desrosiers C

## 2014-04-15 NOTE — Progress Notes (Signed)
04/15/2014 4:04 PM Hemodialysis Outpatient Note; [facility] Tuesday, Thursday and Saturday 2nd shift schedule. The center will expect the patient on Saturday April 18, 2014. The patient has been advised that she needs to visit the center on Friday at 11:30 to sign paperwork and consents. The patient told me that she is waiting on transportation to be set up by Child psychotherapistsocial worker. Thank you. Tilman NeatKrzywonos, Lillyth Spong H

## 2014-04-15 NOTE — ED Provider Notes (Signed)
CSN: [id number]     Arrival date & time 04/15/14  0410 History   First MD Initiated Contact with Patient 04/15/14 [phone]     Chief Complaint  Patient presents with  . Shortness of Breath     (Consider location/radiation/quality/duration/timing/severity/associated sxs/prior Treatment) HPI 63 year old female presents to the emergency department via EMS with complaint of shortness of breath persisted since midnight.  Patient also complains of bilateral leg pain left greater than right.  She has history of chronic leg pain.  Patient also complains of tightness in her chest across her sternum.  Patient reports she missed dialysis yesterday, usually has dialysis Tuesday Thursday Saturday.  She was admitted over the past weekend and had dialysis but left AMA as she did not want to go to assisted living facility.  Patient had been living in [place],, and had her dialysis center there.  She moved here as she did not like her living conditions in [place], and did not arrange for transfer of her dialysis to [place].  Patient does not have access to transportation to her old dialysis center.  Patient was seen in the emergency department earlier today with similar complaints.  Past history of hypertension, HIV, morbid obesity, end-stage renal disease, CHF, anxiety Past Medical History  Diagnosis Date  . Hypertension   . HIV infection   . Morbid obesity   . ESRD (end stage renal disease) on dialysis 09/30/2013    [place] in [place] around [place].  ESRD due to HTN vs drug abuse according to pt.  Was [facility] dialysis [facility] until Feb 2015 when she was admitted to a SNF due to homelessness and drug abuse.  Then changed to [facility] on TTS schedule.     . Anginal pain   . CHF (congestive heart failure)   . Shortness of breath   . Pneumonia   . Depression   . Anxiety   . GERD (gastroesophageal reflux disease)   . Arthritis   . Anemia    Past Surgical History  Procedure Laterality  Date  . Arteriovenous graft placement      left forearm  . Cardiac catheterization    . Laparotomy      states seh was cut open because seh was bleeding on the inside   Family History  Problem Relation Age of Onset  . Kidney failure Other     niece  . High blood pressure    . Lung cancer    . Breast cancer Neg Hx   . Colon cancer Neg Hx   . Stroke Mother   . HIV/AIDS Brother     died of AIDS   History  Substance Use Topics  . Smoking status: Current Every Day Smoker -- 0.50 packs/day    Types: Cigarettes  . Smokeless tobacco: Not on file  . Alcohol Use: No   OB History   Grav Para Term Preterm Abortions TAB SAB Ect Mult Living                 Review of Systems  See History of Present Illness; otherwise all other systems are reviewed and negative   Allergies  Tramadol and Morphine and related  Home Medications   Prior to Admission medications   Medication Sig Start Date End Date Taking? Authorizing Provider  amitriptyline (ELAVIL) 50 MG tablet Take 50 mg by mouth at bedtime.    Historical Provider, MD  amLODipine (NORVASC) 5 MG tablet Take 5 mg by mouth daily.  Historical Provider, MD  aspirin EC 81 MG tablet Take 81 mg by mouth daily.    Historical Provider, MD  Aspirin-Salicylamide-Caffeine (BC HEADACHE PO) Take 2 packets by mouth 2 (two) times daily as needed. pain    Historical Provider, MD  carvedilol (COREG) 3.125 MG tablet Take 3.125 mg by mouth 2 (two) times daily with a meal.    Historical Provider, MD  diclofenac sodium (VOLTAREN) 1 % GEL Apply 4 g topically 4 (four) times daily.    Historical Provider, MD  diphenhydrAMINE (BENADRYL) 50 MG capsule Take 50 mg by mouth at bedtime as needed for sleep.    Historical Provider, MD  FLUoxetine (PROZAC) 20 MG capsule Take 20 mg by mouth daily.    Historical Provider, MD  HYDROcodone-acetaminophen (NORCO/VICODIN) 5-325 MG per tablet Take 1 tablet by mouth every 6 (six) hours as needed for moderate pain. 04/14/14    Jacquelin Hawking, MD  hydrOXYzine (ATARAX/VISTARIL) 25 MG tablet Take 25 mg by mouth 3 (three) times daily.    Historical Provider, MD  lamiVUDine (EPIVIR) 10 MG/ML solution Take 50 mg by mouth daily.    Historical Provider, MD  multivitamin (RENA-VIT) TABS tablet Take 1 tablet by mouth daily.    Historical Provider, MD  omeprazole (PRILOSEC) 20 MG capsule Take 20 mg by mouth daily.    Historical Provider, MD  polyethylene glycol (MIRALAX / GLYCOLAX) packet Take 17 g by mouth daily.    Historical Provider, MD  Protein POWD Take 1 scoop by mouth 3 (three) times daily. Mixed with applesauce    Historical Provider, MD  raltegravir (ISENTRESS) 400 MG tablet Take 400 mg by mouth 2 (two) times daily.     Historical Provider, MD  senna-docusate (SENOKOT-S) 8.6-50 MG per tablet Take 2 tablets by mouth daily.    Historical Provider, MD  sevelamer carbonate (RENVELA) 800 MG tablet Take 1,600-2,400 mg by mouth 3 (three) times daily with meals. *takes 2400mg  with meals and 1600mg  with snacks*    Historical Provider, MD  tenofovir (VIREAD) 300 MG tablet Take 300 mg by mouth every Friday. Takes on Fridays.    Historical Provider, MD   BP 143/92  Pulse 103  Temp(Src) 97.8 F (36.6 C) (Oral)  Resp 17  Ht 5\' 5"  (1.651 m)  Wt 245 lb (111.131 kg)  BMI 40.77 kg/m2  SpO2 98% Physical Exam  Nursing note and vitals reviewed. Constitutional: She is oriented to person, place, and time. She appears well-developed and well-nourished.  HENT:  Head: Normocephalic and atraumatic.  Right Ear: External ear normal.  Left Ear: External ear normal.  Nose: Nose normal.  Mouth/Throat: Oropharynx is clear and moist.  Eyes: Conjunctivae and EOM are normal. Pupils are equal, round, and reactive to light.  Neck: Normal range of motion. Neck supple. No JVD present. No tracheal deviation present. No thyromegaly present.  Cardiovascular: Normal rate, regular rhythm, normal heart sounds and intact distal pulses.  Exam reveals no  gallop and no friction rub.   No murmur heard. Pulmonary/Chest: Effort normal. No stridor. No respiratory distress. She has no wheezes. She has rales (in bases bilaterally). She exhibits no tenderness.  Patient does not have any respiratory difficulty, speaking in paragraphs  Abdominal: Soft. Bowel sounds are normal. She exhibits no distension and no mass. There is no tenderness. There is no rebound and no guarding.  Musculoskeletal: Normal range of motion. She exhibits tenderness (patient has diffuse tenderness to legs bilaterally with palpation , swelling of lower legs,  left greater  than right). She exhibits no edema.  Lymphadenopathy:    She has no cervical adenopathy.  Neurological: She is alert and oriented to person, place, and time. She has normal reflexes. No cranial nerve deficit. She exhibits normal muscle tone. Coordination normal.  Skin: Skin is warm and dry. No rash noted. No erythema. No pallor.  Psychiatric: She has a normal mood and affect. Her behavior is normal. Judgment and thought content normal.    ED Course  Procedures (including critical care time) Labs Review Labs Reviewed  BASIC METABOLIC PANEL - Abnormal; Notable for the following:    Potassium 5.7 (*)    Chloride 95 (*)    Glucose, Bld 125 (*)    BUN 82 (*)    Creatinine, Ser 12.57 (*)    GFR calc non Af Amer 3 (*)    GFR calc Af Amer 3 (*)    Anion gap 22 (*)    All other components within normal limits  CBC WITH DIFFERENTIAL - Abnormal; Notable for the following:    RBC 2.97 (*)    Hemoglobin 10.1 (*)    HCT 31.5 (*)    MCV 106.1 (*)    All other components within normal limits  TROPONIN I    Imaging Review Dg Chest 2 View  04/15/2014   CLINICAL DATA:  Shortness of breath.  Smoker.  EXAM: CHEST  2 VIEW  COMPARISON:  04/11/2014  FINDINGS: Shallow inspiration. The heart size and mediastinal contours are within normal limits. Both lungs are clear. The visualized skeletal structures are unremarkable.   IMPRESSION: No active cardiopulmonary disease.   Electronically Signed   By: Burman NievesWilliam  Stevens M.D.   On: 04/15/2014 05:17     EKG Interpretation   Date/Time:  Wednesday April 15 2014 04:24:21 EDT Ventricular Rate:  110 PR Interval:  164 QRS Duration: 105 QT Interval:  346 QTC Calculation: 468 R Axis:   61 Text Interpretation:  Sinus tachycardia No significant change since last  tracing Confirmed by Claudio Mondry  MD, Amarri Michaelson ([id number]) on 04/15/2014 4:53:33 AM      MDM   Final diagnoses:  Shortness of breath  Noncompliance  ESRD (end stage renal disease) on dialysis  Pain of lower leg, unspecified laterality    63 year old female with shortness of breath, having missed dialysis. It appears that her biggest complaint, however is her bilateral lower leg pain which has been ongoing for years.  Patient refused Vicodin and she says that it does not work.  She reports that she was given a prescription for oxycodone earlier today, but has not yet had it filled.  I discussed the case with Dr. Marisue HumbleSanford, on-call for [facility].  As patient does not have any access to dialysis, and his missed dialysis for greater than a day and has no plans for access this week, he recommends admission for dialysis and work with social work so that we can find her place to get dialysis done.  Potassium has decreased from her earlier level today.  She is not in respiratory distress..  Patient discussed with hospitalist who will admit    Olivia Mackielga M Ellorie Kindall, MD 04/15/14 [phone]

## 2014-04-15 NOTE — Consult Note (Signed)
[facility] Renal Consultation Note    Indication for Consultation:  Management of ESRD/hemodialysis; anemia, hypertension/volume and secondary hyperparathyroidism  HPI: [name] is a 63 y.o. female ESRD patient with multiple medical issues complicated by illicit drug use, intermittent homelessness/placements and medical non-compliance who has presented to the ED with complaints of SOB, chest pain and lower extremity pain/edema.  She was admitted 7/4 for similar complaints, but left AMA the next day. She returned to the ED on 7/7,  was discharged and is now back today. Her last hemodialysis session was on 7/4 here at [facility]. She normally dialyzes on TTS schedule at the [facility] unit but abruptly moved to [place] without having a confirmed HD chair locally.  She still has a spot in [place] for outpatient HD but has no transportation to treatment as it is across [employer].  Admission labs are significant for K+ 5.7. Troponins are negative x 3. She is hypoxic with pulmonary vascular congestion  noted on CXR. Systolic blood pressures are elevated in the 200s. She ~7kg above her estimated dry weight with non-productive cough, labored breathing on O2 and lower extremity edema. She denies HA, dizziness, or GI disturbances.  Past Medical History  Diagnosis Date  . Hypertension   . HIV infection   . Morbid obesity   . ESRD (end stage renal disease) on dialysis 09/30/2013    Started dialysis in [place], [place] around 2009.  ESRD due to HTN vs drug abuse according to pt.  Was on dialysis at [facility] until Feb 2015 when she was admitted to a SNF due to homelessness and drug abuse.  Then changed to [facility] on TTS schedule.     . Anginal pain   . CHF (congestive heart failure)   . Shortness of breath   . Pneumonia   . Depression   . Anxiety   . GERD (gastroesophageal reflux disease)   . Arthritis   . Anemia    Past Surgical History  Procedure Laterality  Date  . Arteriovenous graft placement      left forearm  . Cardiac catheterization    . Laparotomy      states seh was cut open because seh was bleeding on the inside   Family History  Problem Relation Age of Onset  . Kidney failure Other     niece  . High blood pressure    . Lung cancer    . Breast cancer Neg Hx   . Colon cancer Neg Hx   . Stroke Mother   . HIV/AIDS Brother     died of AIDS   Social History:  reports that she has been smoking Cigarettes.  She has been smoking about 0.50 packs per day. She has never used smokeless tobacco. She reports that she uses illicit drugs (Marijuana and "Crack" cocaine) about once per week. She reports that she does not drink alcohol. Allergies  Allergen Reactions  . Tramadol     "Gives me bad dreams"  . Morphine And Related Hives and Rash   Prior to Admission medications   Medication Sig Start Date End Date Taking? Authorizing Provider  amitriptyline (ELAVIL) 50 MG tablet Take 50 mg by mouth at bedtime.   Yes Historical Provider, MD  amLODipine (NORVASC) 5 MG tablet Take 5 mg by mouth daily.   Yes Historical Provider, MD  aspirin EC 81 MG tablet Take 81 mg by mouth daily.   Yes Historical Provider, MD  Aspirin-Salicylamide-Caffeine (BC HEADACHE PO)  Take 2 packets by mouth 2 (two) times daily as needed. pain   Yes Historical Provider, MD  carvedilol (COREG) 3.125 MG tablet Take 3.125 mg by mouth 2 (two) times daily with a meal.   Yes Historical Provider, MD  diclofenac sodium (VOLTAREN) 1 % GEL Apply 4 g topically 4 (four) times daily as needed (pain).    Yes Historical Provider, MD  diphenhydrAMINE (BENADRYL) 50 MG capsule Take 50 mg by mouth at bedtime as needed for sleep.   Yes Historical Provider, MD  FLUoxetine (PROZAC) 20 MG capsule Take 20 mg by mouth daily.   Yes Historical Provider, MD  HYDROcodone-acetaminophen (NORCO/VICODIN) 5-325 MG per tablet Take 1 tablet by mouth every 6 (six) hours as needed for moderate pain. 04/14/14  Yes  Jacquelin Hawking, MD  hydrOXYzine (ATARAX/VISTARIL) 25 MG tablet Take 25 mg by mouth 3 (three) times daily.   Yes Historical Provider, MD  lamiVUDine (EPIVIR) 10 MG/ML solution Take 50 mg by mouth daily.   Yes Historical Provider, MD  multivitamin (RENA-VIT) TABS tablet Take 1 tablet by mouth daily.   Yes Historical Provider, MD  omeprazole (PRILOSEC) 20 MG capsule Take 20 mg by mouth daily.   Yes Historical Provider, MD  polyethylene glycol (MIRALAX / GLYCOLAX) packet Take 17 g by mouth daily as needed for mild constipation.    Yes Historical Provider, MD  Protein POWD Take 1 scoop by mouth 3 (three) times daily. Mixed with applesauce   Yes Historical Provider, MD  raltegravir (ISENTRESS) 400 MG tablet Take 400 mg by mouth 2 (two) times daily.    Yes Historical Provider, MD  senna-docusate (SENOKOT-S) 8.6-50 MG per tablet Take 2 tablets by mouth daily.   Yes Historical Provider, MD  sevelamer carbonate (RENVELA) 800 MG tablet Take 1,600-2,400 mg by mouth 3 (three) times daily with meals. *takes 2400mg  with meals and 1600mg  with snacks*   Yes Historical Provider, MD  tenofovir (VIREAD) 300 MG tablet Take 300 mg by mouth every Friday. Takes on Fridays.   Yes Historical Provider, MD   Current Facility-Administered Medications  Medication Dose Route Frequency Provider Last Rate Last Dose  . 0.9 %  sodium chloride infusion  250 mL Intravenous PRN Belkys A Regalado, MD      . acetaminophen (TYLENOL) tablet 650 mg  650 mg Oral Q6H PRN Belkys A Regalado, MD       Or  . acetaminophen (TYLENOL) suppository 650 mg  650 mg Rectal Q6H PRN Belkys A Regalado, MD      . amitriptyline (ELAVIL) tablet 50 mg  50 mg Oral QHS Belkys A Regalado, MD      . amLODipine (NORVASC) tablet 5 mg  5 mg Oral Daily Belkys A Regalado, MD      . aspirin EC tablet 81 mg  81 mg Oral Daily Belkys A Regalado, MD      . carvedilol (COREG) tablet 3.125 mg  3.125 mg Oral BID WC Belkys A Regalado, MD      . diclofenac sodium (VOLTAREN) 1 %  transdermal gel 4 g  4 g Topical QID PRN Belkys A Regalado, MD      . diphenhydrAMINE (BENADRYL) capsule 50 mg  50 mg Oral QHS PRN Belkys A Regalado, MD      . Melene Muller ON 04/16/2014] doxercalciferol (HECTOROL) injection 7 mcg  7 mcg Intravenous Q T,Th,Sa-HD Kerin Salen, PA-C      . FLUoxetine (PROZAC) capsule 20 mg  20 mg Oral Daily Belkys A Regalado, MD      .  heparin injection 5,000 Units  5,000 Units Subcutaneous 3 times per day Belkys A Regalado, MD      . HYDROcodone-acetaminophen (NORCO/VICODIN) 5-325 MG per tablet 1 tablet  1 tablet Oral Q6H PRN Belkys A Regalado, MD      . hydrOXYzine (ATARAX/VISTARIL) tablet 25 mg  25 mg Oral TID PRN Belkys A Regalado, MD      . ipratropium-albuterol (DUONEB) 0.5-2.5 (3) MG/3ML nebulizer solution 3 mL  3 mL Nebulization Q6H PRN Belkys A Regalado, MD      . lamiVUDine (EPIVIR) 10 MG/ML solution 50 mg  50 mg Oral Daily Belkys A Regalado, MD      . multivitamin (RENA-VIT) tablet 1 tablet  1 tablet Oral QHS Belkys A Regalado, MD      . ondansetron (ZOFRAN) tablet 4 mg  4 mg Oral Q6H PRN Belkys A Regalado, MD       Or  . ondansetron (ZOFRAN) injection 4 mg  4 mg Intravenous Q6H PRN Belkys A Regalado, MD      . pantoprazole (PROTONIX) EC tablet 40 mg  40 mg Oral Daily Belkys A Regalado, MD      . polyethylene glycol (MIRALAX / GLYCOLAX) packet 17 g  17 g Oral Daily PRN Belkys A Regalado, MD      . raltegravir (ISENTRESS) tablet 400 mg  400 mg Oral BID Belkys A Regalado, MD      . senna-docusate (Senokot-S) tablet 2 tablet  2 tablet Oral Daily Belkys A Regalado, MD      . sevelamer carbonate (RENVELA) tablet 1,600 mg  1,600 mg Oral PRN Belkys A Regalado, MD      . sevelamer carbonate (RENVELA) tablet 2,400 mg  2,400 mg Oral TID WC Belkys A Regalado, MD      . sodium chloride 0.9 % injection 3 mL  3 mL Intravenous Q12H Belkys A Regalado, MD      . sodium chloride 0.9 % injection 3 mL  3 mL Intravenous PRN Belkys A Regalado, MD      . Melene Muller[START ON 04/17/2014]  tenofovir (VIREAD) tablet 300 mg  300 mg Oral Q Fri Alba CoryBelkys A Regalado, MD       Labs: Basic Metabolic Panel:  Recent Labs Lab 04/10/14 0818  04/12/14 0510 04/14/14 1549 04/15/14 0435  NA 137  < > 136* 138 137  K 4.8  < > 4.3 6.4* 5.7*  CL 94*  < > 94* 95* 95*  CO2 25  --  25 22 20   GLUCOSE 88  < > 82 128* 125*  BUN 44*  < > 24* 73* 82*  CREATININE 12.23*  < > 7.70* 12.03* 12.57*  CALCIUM 8.1*  --  8.6 8.4 8.6  PHOS 3.3  --  4.4  --   --   < > = values in this interval not displayed. Liver Function Tests:  Recent Labs Lab 04/12/14 0510 04/14/14 1549  AST  --  14  ALT  --  <5  ALKPHOS  --  328*  BILITOT  --  <0.2*  PROT  --  7.6  ALBUMIN 3.4* 3.5   CBC:  Recent Labs Lab 04/10/14 0818 04/11/14 1205 04/14/14 1549 04/15/14 0435  WBC 6.8  --  7.7 7.6  NEUTROABS 3.2  --   --  4.9  HGB 12.3 12.9 10.8* 10.1*  HCT 38.0 38.0 33.9* 31.5*  MCV 106.7*  --  105.6* 106.1*  PLT 221  --  235 222   Cardiac Enzymes:  Recent  Labs Lab 04/14/14 1549 04/15/14 0436 04/15/14 0940  TROPONINI <0.30 <0.30 <0.30   Studies/Results: Dg Chest 2 View  04/15/2014   CLINICAL DATA:  Shortness of breath.  Smoker.  EXAM: CHEST  2 VIEW  COMPARISON:  04/11/2014  FINDINGS: Shallow inspiration. The heart size and mediastinal contours are within normal limits. Both lungs are clear. The visualized skeletal structures are unremarkable.  IMPRESSION: No active cardiopulmonary disease.   Electronically Signed   By: Burman Nieves M.D.   On: 04/15/2014 05:17    ROS: Cough, SOB, LE swelling, Rt foot/hip pain. 10 pt ROS asked and answered. All other systems negative except as above.   Physical Exam: Filed Vitals:   04/15/14 0707 04/15/14 0740 04/15/14 0810 04/15/14 0822  BP: 145/88   172/94  Pulse: 99   101  Temp:    98 F (36.7 C)  TempSrc:    Oral  Resp: 17   19  Height:   5\' 4"  (1.626 m)   Weight:   117.9 kg (259 lb 14.8 oz)   SpO2: 100% 100%  94%     General: Chronically  ill-appearing, looks uncomfortable but in no acute distress. Head: Normocephalic, atraumatic, sclera non-icteric, mucus membranes are moist Neck: Supple. JVD noted Lungs: Bilat wheezes/rales. Non-productive cough. Breathing labored on 2L 02 via [place]. Heart: RRR with S1 S2. No murmurs, rubs, or gallops appreciated. Abdomen: Soft, non-tender, non-distended with normoactive bowel sounds. No rebound/guarding. No obvious abdominal masses. M-S:  Strength and tone appear normal for age. Lower extremities: ++ LE edema,  No ischemic changes, no open wounds  Neuro: Alert and oriented X 3. Moves all extremities spontaneously. Psych:  Responds to questions appropriately with a normal affect. Poor insight. Dialysis Access: LFA  AVF + bruit,  ecchymosis proximally (says she had an intervention on Monday @ [facility])   Dialysis Orders: Center: [facility]  on TTS (Awaiting acceptance to a center in [place]) EDW 110kg  HD Bath 2K/2.25Ca Time 4000 Heparin 7000. Access LFA AVF   Hectorol 7 mcg IV/HD Aranesp (stopped, Hgb at goal)   Units IV/HD  Venofer  50 mg IV weekly    Assessment/Plan: 1. Acute hypoxic respiratory failure - Secondary to missing HD. Stable on 2L 02. For HD today. 2. ESRD/Hyperkalemia -  TTS, K+ 5.7, HD today 3. Hypertension/volume  - SBPs 170s most recently. On low doses of amlodipine and Coreg. Up ~8kg by weights with overt excess on physical exam. Plan serial HD today and tomorrow to get back on schedule and get to edw. 4. Anemia  - Hgb 10.1, previously at goal outpatient on Aranesp . Missing doses. Will resume ESA and continue weekly Fe. 5. Metabolic bone disease -  Ca 8.6 (~9 corrected.) Last Phos 4.4. Last PTH 790 on 6/18.  Continue Vit D and binders. 6. Nutrition - Renal diet/multivitamins 7. Depression with emotional lability/history of disruptive behavior - on on prozac 8. HIV+ per primary 9. Substance abuse/Tobacco abuse 10. Outpatient HD issues: Complicated by difficult social  situation, poor insight, and history of disruptive behavior. Previous HD at [place], [place], and now [place]. Of note, she has not been discharged from [facility] but no longer lives close to that center. She is wheelchair dependent and without many options for transportation across [employer]. Placement at an HD center in [place] will be difficult due to non-compliance and behavioral issues. Has been refused at [facility]. Will d/w SW. (ADDENDUM - Pt has been accepted at [facility].)  Claud Kelp, PA-C [facility] Pager [phone] 04/15/2014, 12:04 PM   Renal Attending: Admitted for psychosocial reasons, and SW has worked efficiently at a resolution.  She will [facility] when transportation available.  Rhyan Radler C

## 2014-04-15 NOTE — ED Notes (Signed)
Patient presents via EMS with c/o SOB since midnight.  She did not have dialysis on Tuesday (usually goes Tues, Thurs, Sat) being told she did not enough fluid to draw off.  Was seen in the ED  Saturday and Tuesday for SOB.  EMS adm 5mg  of albuterol.  Did not c/o any pain (CP included).

## 2014-04-15 NOTE — H&P (Signed)
[facility] History and Physical  [name] KPT:[id number] DOB: 1951-05-05 DOA: 04/15/2014  Referring physician: Dr Sharol Given.  PCP: No PCP Per Patient   Chief Complaint: SOB.   HPI: [name] is a 63 y.o. female with history of hypertension, congestive heart failure, HIV, end-stage renal disease on hemodialysis, currently no outpatient hemodialysis center set up, depression/anxiety, acid reflux, arthritis who presents with shortness of breath. She recently left the hospital against medical advised and out-patient dialysis couldn't be set up. Patient return with SOB, chest pain that started 2 days prior to admission. She describes chest pain as tightness, lasted for few minutes, resolved with oxygen. She is chest pain free. She also notice couples of episode of chest pain and dyspnea on exertion since 2 weeks ago. She is still complaining of lower extremities edema. She is still complaining of knee, hip pain, chronic.  She denies abdominal pain, nausea, vomiting, diarrhea.     Review of Systems:  Negative, except as per HPI.   Past Medical History  Diagnosis Date  . Hypertension   . HIV infection   . Morbid obesity   . ESRD (end stage renal disease) on dialysis 09/30/2013    Started dialysis in [place], [place] around 2009.  ESRD due to HTN vs drug abuse according to pt.  Was on dialysis at [facility] until Feb 2015 when she was admitted to a SNF due to homelessness and drug abuse.  Then changed to [facility] on TTS schedule.     . Anginal pain   . CHF (congestive heart failure)   . Shortness of breath   . Pneumonia   . Depression   . Anxiety   . GERD (gastroesophageal reflux disease)   . Arthritis   . Anemia    Past Surgical History  Procedure Laterality Date  . Arteriovenous graft placement      left forearm  . Cardiac catheterization    . Laparotomy      states seh was cut open because seh was bleeding on the inside   Social History:  reports that she  has been smoking Cigarettes.  She has been smoking about 0.50 packs per day. She does not have any smokeless tobacco history on file. She reports that she uses illicit drugs (Marijuana and "Crack" cocaine) about once per week. She reports that she does not drink alcohol.  Allergies  Allergen Reactions  . Tramadol     "Gives me bad dreams"  . Morphine And Related Hives and Rash    Family History  Problem Relation Age of Onset  . Kidney failure Other     niece  . High blood pressure    . Lung cancer    . Breast cancer Neg Hx   . Colon cancer Neg Hx   . Stroke Mother   . HIV/AIDS Brother     died of AIDS     Prior to Admission medications   Medication Sig Start Date End Date Taking? Authorizing Provider  amitriptyline (ELAVIL) 50 MG tablet Take 50 mg by mouth at bedtime.   Yes Historical Provider, MD  amLODipine (NORVASC) 5 MG tablet Take 5 mg by mouth daily.   Yes Historical Provider, MD  aspirin EC 81 MG tablet Take 81 mg by mouth daily.   Yes Historical Provider, MD  Aspirin-Salicylamide-Caffeine (BC HEADACHE PO) Take 2 packets by mouth 2 (two) times daily as needed. pain   Yes Historical Provider, MD  carvedilol (COREG) 3.125 MG tablet  Take 3.125 mg by mouth 2 (two) times daily with a meal.   Yes Historical Provider, MD  diclofenac sodium (VOLTAREN) 1 % GEL Apply 4 g topically 4 (four) times daily as needed (pain).    Yes Historical Provider, MD  diphenhydrAMINE (BENADRYL) 50 MG capsule Take 50 mg by mouth at bedtime as needed for sleep.   Yes Historical Provider, MD  FLUoxetine (PROZAC) 20 MG capsule Take 20 mg by mouth daily.   Yes Historical Provider, MD  HYDROcodone-acetaminophen (NORCO/VICODIN) 5-325 MG per tablet Take 1 tablet by mouth every 6 (six) hours as needed for moderate pain. 04/14/14  Yes Cordelia Poche, MD  hydrOXYzine (ATARAX/VISTARIL) 25 MG tablet Take 25 mg by mouth 3 (three) times daily.   Yes Historical Provider, MD  lamiVUDine (EPIVIR) 10 MG/ML solution Take 50 mg  by mouth daily.   Yes Historical Provider, MD  multivitamin (RENA-VIT) TABS tablet Take 1 tablet by mouth daily.   Yes Historical Provider, MD  omeprazole (PRILOSEC) 20 MG capsule Take 20 mg by mouth daily.   Yes Historical Provider, MD  polyethylene glycol (MIRALAX / GLYCOLAX) packet Take 17 g by mouth daily as needed for mild constipation.    Yes Historical Provider, MD  Protein POWD Take 1 scoop by mouth 3 (three) times daily. Mixed with applesauce   Yes Historical Provider, MD  raltegravir (ISENTRESS) 400 MG tablet Take 400 mg by mouth 2 (two) times daily.    Yes Historical Provider, MD  senna-docusate (SENOKOT-S) 8.6-50 MG per tablet Take 2 tablets by mouth daily.   Yes Historical Provider, MD  sevelamer carbonate (RENVELA) 800 MG tablet Take 1,600-2,400 mg by mouth 3 (three) times daily with meals. *takes 2435m with meals and 16074mwith snacks*   Yes Historical Provider, MD  tenofovir (VIREAD) 300 MG tablet Take 300 mg by mouth every Friday. Takes on Fridays.   Yes Historical Provider, MD   Physical Exam: Filed Vitals:   04/15/14 0707  BP: 145/88  Pulse: 99  Temp:   Resp: 17    BP 145/88  Pulse 99  Temp(Src) 97.8 F (36.6 C) (Oral)  Resp 17  Ht _0  (1.626 m)  Wt 117.9 kg (259 lb 14.8 oz)  BMI 44.59 kg/m2  SpO2 100%  General:  Appears calm and comfortable, on 2 L of oxygen.  Eyes: PERRL, normal lids, irises & conjunctiva ENT: grossly normal hearing, lips & tongue Neck: no LAD, masses or thyromegaly, positive JVD.  Cardiovascular: RRR, no m/r/g.bilateral  LE edema. Telemetry: SR, no arrhythmias  Respiratory: bilateral ronchus,and crackles. Normal respiratory effort. Abdomen: soft, ntnd Skin: no rash or induration seen on limited exam Musculoskeletal: grossly normal tone BUE/BLE Psychiatric: grossly normal mood and affect, speech fluent and appropriate Neurologic: grossly non-focal.          Labs on Admission:  Basic Metabolic Panel:  Recent Labs Lab  04/10/14 0818 04/11/14 1205 04/12/14 0510 04/14/14 1549 04/15/14 0435  NA 137 136* 136* 138 137  K 4.8 5.4* 4.3 6.4* 5.7*  CL 94* 100 94* 95* 95*  CO2 25  --  _1 GLUCOSE 88 96 82 128* 125*  BUN 44* 56* 24* 73* 82*  CREATININE 12.23* 15.60* 7.70* 12.03* 12.57*  CALCIUM 8.1*  --  8.6 8.4 8.6  MG 2.5  --   --   --   --   PHOS 3.3  --  4.4  --   --    Liver Function Tests:  Recent Labs  Lab 04/12/14 0510 04/14/14 1549  AST  --  14  ALT  --  <5  ALKPHOS  --  328*  BILITOT  --  <0.2*  PROT  --  7.6  ALBUMIN 3.4* 3.5   No results found for this basename: LIPASE, AMYLASE,  in the last 168 hours No results found for this basename: AMMONIA,  in the last 168 hours CBC:  Recent Labs Lab 04/10/14 0818 04/11/14 1205 04/14/14 1549 04/15/14 0435  WBC 6.8  --  7.7 7.6  NEUTROABS 3.2  --   --  4.9  HGB 12.3 12.9 10.8* 10.1*  HCT 38.0 38.0 33.9* 31.5*  MCV 106.7*  --  105.6* 106.1*  PLT 221  --  235 222   Cardiac Enzymes:  Recent Labs Lab 04/14/14 1549 04/15/14 0436  TROPONINI <0.30 <0.30    BNP (last 3 results)  Recent Labs  10/06/13 0334 10/21/13 2037 11/08/13 1903  PROBNP 31834.0* 23932.0* 42892.0*   CBG: No results found for this basename: GLUCAP,  in the last 168 hours  Radiological Exams on Admission: Dg Chest 2 View  04/15/2014   CLINICAL DATA:  Shortness of breath.  Smoker.  EXAM: CHEST  2 VIEW  COMPARISON:  04/11/2014  FINDINGS: Shallow inspiration. The heart size and mediastinal contours are within normal limits. Both lungs are clear. The visualized skeletal structures are unremarkable.  IMPRESSION: No active cardiopulmonary disease.   Electronically Signed   By: Lucienne Capers M.D.   On: 04/15/2014 05:17    EKG: Sinus tachycardia.   Assessment/Plan Principal Problem:   Respiratory failure Active Problems:   HIV infection   ESRD (end stage renal disease)   Acute respiratory failure with hypoxemia    1-Acute Hypoxic Respiratory in  setting of ESRD. Patient last dialysis was Last Saturday. She is stable on 2 L of oxygen. Chest x ray with no overt pulmonary edema. Plan for dialysis today. Renal consulted.   2-ESRD; Patient need out-patient dialysis set up. SW, CM consulted. Renal consulted.   3-Chest pain: could be in setting of pulmonary edema, hypoxemia. Also prior history of drug use. Will cycle cardiac enzymes, will check ECHO. First troponin negative, EKG sinus tachycardia.   4-Lower Extremities edema; Check doppler.   5-HIV; continue with Raltegravir, tenofovir.   6-HTN; continue with coreg and Norvasc.   7-Arthritis, chronic pain. Continue with Vicodin,.   8-increase Alkaline phosphatase, will repeat in am. Denies abdominal pain at this time. Increase Alk phosphatase level could be related to Raltegravir.   Code Status: Presume Full code.  Family Communication: Care discussed with Patient.  Disposition Plan: expect 3 to 4 days.   Time spent: 75 minutes.   Niel Hummer A [facility] Pager [phone]  **Disclaimer: This note may have been dictated with voice recognition software. Similar sounding words can inadvertently be transcribed and this note may contain transcription errors which may not have been corrected upon publication of note.**

## 2014-04-15 NOTE — ED Notes (Signed)
Admitting MD at bedside.

## 2014-04-15 NOTE — ED Notes (Signed)
Patient transported to X-ray 

## 2014-04-16 DIAGNOSIS — I517 Cardiomegaly: Secondary | ICD-10-CM

## 2014-04-16 LAB — CBC
HEMATOCRIT: 31.4 % — AB (ref 36.0–46.0)
Hemoglobin: 9.9 g/dL — ABNORMAL LOW (ref 12.0–15.0)
MCH: 33.8 pg (ref 26.0–34.0)
MCHC: 31.5 g/dL (ref 30.0–36.0)
MCV: 107.2 fL — AB (ref 78.0–100.0)
PLATELETS: 223 10*3/uL (ref 150–400)
RBC: 2.93 MIL/uL — ABNORMAL LOW (ref 3.87–5.11)
RDW: 13.9 % (ref 11.5–15.5)
WBC: 7.1 10*3/uL (ref 4.0–10.5)

## 2014-04-16 LAB — BASIC METABOLIC PANEL
Anion gap: 18 — ABNORMAL HIGH (ref 5–15)
BUN: 37 mg/dL — AB (ref 6–23)
CHLORIDE: 95 meq/L — AB (ref 96–112)
CO2: 24 mEq/L (ref 19–32)
CREATININE: 7.22 mg/dL — AB (ref 0.50–1.10)
Calcium: 8.8 mg/dL (ref 8.4–10.5)
GFR calc Af Amer: 6 mL/min — ABNORMAL LOW (ref >90)
GFR calc non Af Amer: 5 mL/min — ABNORMAL LOW (ref >90)
Glucose, Bld: 104 mg/dL — ABNORMAL HIGH (ref 70–99)
Potassium: 4.9 mEq/L (ref 3.7–5.3)
Sodium: 137 mEq/L (ref 137–147)

## 2014-04-16 LAB — HEPATIC FUNCTION PANEL
ALBUMIN: 3.3 g/dL — AB (ref 3.5–5.2)
AST: 12 U/L (ref 0–37)
Alkaline Phosphatase: 271 U/L — ABNORMAL HIGH (ref 39–117)
Total Bilirubin: 0.2 mg/dL — ABNORMAL LOW (ref 0.3–1.2)
Total Protein: 7.3 g/dL (ref 6.0–8.3)

## 2014-04-16 MED ORDER — CARVEDILOL 6.25 MG PO TABS
6.2500 mg | ORAL_TABLET | Freq: Two times a day (BID) | ORAL | Status: DC
  Administered 2014-04-16 – 2014-04-17 (×2): 6.25 mg via ORAL
  Filled 2014-04-16 (×4): qty 1

## 2014-04-16 MED ORDER — DOXERCALCIFEROL 4 MCG/2ML IV SOLN
INTRAVENOUS | Status: AC
  Administered 2014-04-16: 7 ug via INTRAVENOUS
  Filled 2014-04-16: qty 4

## 2014-04-16 MED ORDER — GUAIFENESIN-DM 100-10 MG/5ML PO SYRP
5.0000 mL | ORAL_SOLUTION | ORAL | Status: DC | PRN
  Administered 2014-04-16: 5 mL via ORAL
  Filled 2014-04-16: qty 5

## 2014-04-16 MED ORDER — DARBEPOETIN ALFA-POLYSORBATE 25 MCG/0.42ML IJ SOLN
25.0000 ug | INTRAMUSCULAR | Status: DC
  Administered 2014-04-16: 25 ug via INTRAVENOUS

## 2014-04-16 MED ORDER — DM-GUAIFENESIN ER 30-600 MG PO TB12
1.0000 | ORAL_TABLET | Freq: Two times a day (BID) | ORAL | Status: DC
  Administered 2014-04-16 – 2014-04-17 (×2): 1 via ORAL
  Filled 2014-04-16 (×5): qty 1

## 2014-04-16 MED ORDER — AMLODIPINE BESYLATE 10 MG PO TABS
10.0000 mg | ORAL_TABLET | Freq: Every day | ORAL | Status: DC
  Filled 2014-04-16: qty 1

## 2014-04-16 MED ORDER — PREDNISONE 20 MG PO TABS
40.0000 mg | ORAL_TABLET | Freq: Every day | ORAL | Status: DC
  Administered 2014-04-16 – 2014-04-17 (×2): 40 mg via ORAL
  Filled 2014-04-16 (×4): qty 2

## 2014-04-16 MED ORDER — IPRATROPIUM-ALBUTEROL 0.5-2.5 (3) MG/3ML IN SOLN
3.0000 mL | RESPIRATORY_TRACT | Status: DC
  Administered 2014-04-16 – 2014-04-17 (×4): 3 mL via RESPIRATORY_TRACT
  Filled 2014-04-16 (×5): qty 3

## 2014-04-16 MED ORDER — CLONIDINE HCL 0.1 MG PO TABS
0.1000 mg | ORAL_TABLET | Freq: Once | ORAL | Status: DC
  Filled 2014-04-16: qty 1

## 2014-04-16 MED ORDER — AMLODIPINE BESYLATE 10 MG PO TABS
10.0000 mg | ORAL_TABLET | Freq: Every day | ORAL | Status: DC
Start: 2014-04-17 — End: 2014-04-17
  Filled 2014-04-16: qty 1

## 2014-04-16 MED ORDER — DARBEPOETIN ALFA-POLYSORBATE 25 MCG/0.42ML IJ SOLN
INTRAMUSCULAR | Status: AC
  Administered 2014-04-16: 25 ug via INTRAVENOUS
  Filled 2014-04-16: qty 0.42

## 2014-04-16 NOTE — Progress Notes (Signed)
Utilization review completed. Kitti Mcclish, RN, BSN. 

## 2014-04-16 NOTE — Progress Notes (Signed)
  Echocardiogram 2D Echocardiogram has been performed.  Cathie BeamsGREGORY, Aneta Hendershott 04/16/2014, 9:43 AM

## 2014-04-16 NOTE — Clinical Social Work Note (Signed)
CSW met with pt at bedside (full assessment to follow).  Pt reports needing assistance with transportation to and from dialysis.  CSW spoke with pt's dialysis center [phone] who confirmed the following: Pt has Medicaid and the Department of Social Services will assist this medical transportation request.  Pt will need to contact the department of social services to arrange transportation for Saturday's dialysis.  Pt is aware and agreeable to contact transit services this afternoon.  CSW is signing off.  [name], [place] [phone]  Clinical Social Work

## 2014-04-16 NOTE — Care Management Note (Signed)
CARE MANAGEMENT NOTE 04/16/2014  Patient:  [name],Lennan ANN   Account Number:  [id number]  Date Initiated:  04/16/2014  Documentation initiated by:  Kijuan Gallicchio  Subjective/Objective Assessment:     Action/Plan:   Pt [place] transportattion and [place] in [place]. CSW working with pt and outpt HD arranged for [facility] per Darel HongJudy in the Hemodialysis center.   Anticipated DC Date:  04/16/2014   Anticipated DC Plan:  HOME/SELF CARE         Choice offered to / List presented to:             Status of service:  Completed, signed off Medicare Important Message given?  NO (If response is "NO", the following Medicare IM given date fields will be blank) Date Medicare IM given:   Medicare IM given by:   Date Additional Medicare IM given:   Additional Medicare IM given by:    Discharge Disposition:    Per UR Regulation:    If discussed at Long Length of Stay Meetings, dates discussed:    Comments:  04/16/2014 Pt inpt < 3 days, IM not required. Johny Shockheryl Dai Apel RN MPH, case manager, [phone]

## 2014-04-16 NOTE — Progress Notes (Signed)
[facility] PROGRESS NOTE  [name] ZOX:[id number] DOB: [birth year] DOA: 04/15/2014 PCP: No PCP Per Patient  Assessment/Plan:  1-Acute Hypoxic Respiratory in setting of ESRD. Patient last dialysis was Last Saturday. She is stable on 2 L of oxygen. Chest x ray with no overt pulmonary edema. Plan for dialysis today. Renal consulted.  2-ESRD; Patient need out-patient dialysis set up. SW, CM consulted. Renal consulted.  3-Chest pain: could be in setting of pulmonary edema, hypoxemia. Also prior history of drug use. Will cycle cardiac enzymes, will check ECHO. First troponin negative, EKG sinus tachycardia.  4-Lower Extremities edema; Check doppler.  5-HIV; continue with Raltegravir, tenofovir.  6-HTN; continue with coreg and Norvasc.  7-Arthritis, chronic pain. Continue with Vicodin,.  8-mild sob with wheezing:  Start pt on duo nebs and prednisone for mild COPD exacerbation.    Code Status:full code. Family Communication: none at bedside.  Disposition Plan: pending.    Consultants:  renal  Procedures:  HD  Antibiotics:  NONE  HPI/Subjective: Has some trouble breathing.   Objective: Filed Vitals:   04/16/14 1800  BP: 182/104  Pulse: 102  Temp:   Resp:     Intake/Output Summary (Last 24 hours) at 04/16/14 1826 Last data filed at 04/16/14 1037  Gross per 24 hour  Intake    960 ml  Output      0 ml  Net    960 ml   Filed Weights   04/15/14 1250 04/15/14 1700 04/16/14 1418  Weight: 118.2 kg (260 lb 9.3 oz) 114.4 kg (252 lb 3.3 oz) 115.4 kg (254 lb 6.6 oz)    Exam:   General:  Alert afebrile comfortable  Cardiovascular: s1s2  Respiratory: bilateral exp wheezing.   Abdomen: soft NT ND BS+  Musculoskeletal: TRACE edema.  Data Reviewed: Basic Metabolic Panel:  Recent Labs Lab 04/10/14 0818 04/11/14 1205 04/12/14 0510 04/14/14 1549 04/15/14 0435 04/16/14 0356  NA 137 136* 136* 138 137 137  K 4.8 5.4* 4.3 6.4* 5.7* 4.9  CL 94* 100  94* 95* 95* 95*  CO2 25  --  25 22 20 24   GLUCOSE 88 96 82 128* 125* 104*  BUN 44* 56* 24* 73* 82* 37*  CREATININE 12.23* 15.60* 7.70* 12.03* 12.57* 7.22*  CALCIUM 8.1*  --  8.6 8.4 8.6 8.8  MG 2.5  --   --   --   --   --   PHOS 3.3  --  4.4  --   --   --    Liver Function Tests:  Recent Labs Lab 04/12/14 0510 04/14/14 1549 04/16/14 0356  AST  --  14 12  ALT  --  <5 <5  ALKPHOS  --  328* 271*  BILITOT  --  <0.2* 0.2*  PROT  --  7.6 7.3  ALBUMIN 3.4* 3.5 3.3*   No results found for this basename: LIPASE, AMYLASE,  in the last 168 hours No results found for this basename: AMMONIA,  in the last 168 hours CBC:  Recent Labs Lab 04/10/14 0818 04/11/14 1205 04/14/14 1549 04/15/14 0435 04/16/14 0356  WBC 6.8  --  7.7 7.6 7.1  NEUTROABS 3.2  --   --  4.9  --   HGB 12.3 12.9 10.8* 10.1* 9.9*  HCT 38.0 38.0 33.9* 31.5* 31.4*  MCV 106.7*  --  105.6* 106.1* 107.2*  PLT 221  --  235 222 223   Cardiac Enzymes:  Recent Labs Lab 04/14/14 1549 04/15/14 0436 04/15/14 0940 04/15/14 1405 04/15/14  2013  TROPONINI <0.30 <0.30 <0.30 <0.30 <0.30   BNP (last 3 results)  Recent Labs  10/06/13 0334 10/21/13 2037 11/08/13 1903  PROBNP 31834.0* 23932.0* 42892.0*   CBG: No results found for this basename: GLUCAP,  in the last 168 hours  Recent Results (from the past 240 hour(s))  MRSA PCR SCREENING     Status: None   Collection Time    04/15/14  6:54 PM      Result Value Ref Range Status   MRSA by PCR NEGATIVE  NEGATIVE Final   Comment:            The GeneXpert MRSA Assay (FDA     approved for NASAL specimens     only), is one component of a     comprehensive MRSA colonization     surveillance program. It is not     intended to diagnose MRSA     infection nor to guide or     monitor treatment for     MRSA infections.     Studies: Dg Chest 2 View  04/15/2014   CLINICAL DATA:  Shortness of breath.  Smoker.  EXAM: CHEST  2 VIEW  COMPARISON:  04/11/2014  FINDINGS:  Shallow inspiration. The heart size and mediastinal contours are within normal limits. Both lungs are clear. The visualized skeletal structures are unremarkable.  IMPRESSION: No active cardiopulmonary disease.   Electronically Signed   By: Burman Nieves M.D.   On: 04/15/2014 05:17    Scheduled Meds: . amitriptyline  50 mg Oral QHS  . [START ON 04/17/2014] amLODipine  10 mg Oral QHS  . aspirin EC  81 mg Oral Daily  . carvedilol  6.25 mg Oral BID WC  . cloNIDine  0.1 mg Oral Once  . [START ON 04/23/2014] darbepoetin (ARANESP) injection - DIALYSIS  25 mcg Intravenous Q Thu-HD  . dextromethorphan-guaiFENesin  1 tablet Oral BID  . doxercalciferol  7 mcg Intravenous Q T,Th,Sa-HD  . ferric gluconate (FERRLECIT/NULECIT) IV  62.5 mg Intravenous Q Thu-HD  . FLUoxetine  20 mg Oral Daily  . heparin  5,000 Units Subcutaneous 3 times per day  . ipratropium-albuterol  3 mL Nebulization Q4H  . lamiVUDine  50 mg Oral Daily  . multivitamin  1 tablet Oral QHS  . pantoprazole  40 mg Oral Daily  . raltegravir  400 mg Oral BID  . senna-docusate  2 tablet Oral Daily  . sevelamer carbonate  2,400 mg Oral TID WC  . sodium chloride  3 mL Intravenous Q12H  . [START ON 04/17/2014] tenofovir  300 mg Oral Q Fri   Continuous Infusions:   Principal Problem:   Respiratory failure Active Problems:   HIV infection   ESRD (end stage renal disease)   Acute respiratory failure with hypoxemia    Time spent: 25 minutes.     [facility]  [facility] Pager [phone]. If 7PM-7AM, please contact night-coverage at [email or web address], password [facility] 04/16/2014, 6:26 PM  LOS: 1 day

## 2014-04-16 NOTE — Progress Notes (Signed)
[facility] Progress Note  Subjective:   Productive cough and chest discomfort. Refusing Echo and HD this morning but agrees after a little encouragement. Very poor insight.  Objective Filed Vitals:   04/15/14 2219 04/16/14 0120 04/16/14 0447 04/16/14 0454  BP: 189/109 166/95  171/107  Pulse: 102 103  102  Temp: 98.3 F (36.8 C)   98.6 F (37 C)  TempSrc: Axillary   Oral  Resp: 17   18  Height:      Weight:      SpO2: 95%  95% 98%   Physical Exam General: Alert, cooperative, NAD Heart: RRR Lungs: +Productive cough. Bilat wheezes. Breathing unlabored on 2L 02 Abdomen: Soft, NT, +BS Extremities: + LE edema Dialysis Access: LFA AVF + bruit  Dialysis Orders: Center: [facility] on TTS (Awaiting acceptance to a center in [place])  EDW 110kg HD Bath 2K/2.25Ca Time 4000 Heparin 7000. Access LFA AVF  Hectorol 7 mcg IV/HD Aranesp (stopped, Hgb at goal) Units IV/HD Venofer 50 mg IV weekly   Assessment/Plan: 1. Acute hypoxic respiratory failure - Secondary to missing HD. Stable on 2L 02. Some improvement after emergent HD yest. 2. Chest pain - Troponins neg. For Echo this a.m. Per primary. 3. Lower extremity pain/edema - Duplex inconclusive. 4. ESRD/Hyperkalemia - TTS, K+ 4.9, HD again today to get on schedule and lower volume 5. Hypertension/volume - SBPs 170s most recently. On low doses of amlodipine and Coreg. Up ~5kg by weights. Still with overt excess on physical exam.  HD again today, UF to edw. 6. Anemia - Hgb 9.9.  Aranesp 25 mcg resumed. Continue weekly Fe. 7. Metabolic bone disease - Ca 8.8 (9.4 corrected.) Last Phos 4.4. Last PTH 790 on 6/18. Continue Vit D and binders. 8. Nutrition - Renal diet/multivitamins 9. Depression with emotional lability/history of disruptive behavior - on on prozac 10. HIV+ per primary 11. Substance abuse/Tobacco abuse 12. Medical non-compliance: Complicated by difficult social situation, poor insight, and history of disruptive  behavior. Has been accepted at [facility], currently working to secure transportation. Appreciate CM assistance.  Scot Jun. [facility]ad Ranger [facility] Pager [phone] 04/16/2014,9:20 AM  LOS: 1 day   Renal Attending: I agree with the evaluation and mangement in the note as articulated above by Ms. Micheal Likens C   Additional Objective Labs: Basic Metabolic Panel:  Recent Labs Lab 04/10/14 0818  04/12/14 0510 04/14/14 1549 04/15/14 0435 04/16/14 0356  NA 137  < > 136* 138 137 137  K 4.8  < > 4.3 6.4* 5.7* 4.9  CL 94*  < > 94* 95* 95* 95*  CO2 25  --  25 22 20 24   GLUCOSE 88  < > 82 128* 125* 104*  BUN 44*  < > 24* 73* 82* 37*  CREATININE 12.23*  < > 7.70* 12.03* 12.57* 7.22*  CALCIUM 8.1*  --  8.6 8.4 8.6 8.8  PHOS 3.3  --  4.4  --   --   --   < > = values in this interval not displayed. Liver Function Tests:  Recent Labs Lab 04/12/14 0510 04/14/14 1549 04/16/14 0356  AST  --  14 12  ALT  --  <5 <5  ALKPHOS  --  328* 271*  BILITOT  --  <0.2* 0.2*  PROT  --  7.6 7.3  ALBUMIN 3.4* 3.5 3.3*   CBC:  Recent Labs Lab 04/10/14 0818  04/14/14 1549 04/15/14 0435 04/16/14 0356  WBC 6.8  --  7.7 7.6 7.1  NEUTROABS 3.2  --   --  4.9  --   HGB 12.3  < > 10.8* 10.1* 9.9*  HCT 38.0  < > 33.9* 31.5* 31.4*  MCV 106.7*  --  105.6* 106.1* 107.2*  PLT 221  --  235 222 223  < > = values in this interval not displayed. Blood Culture No results found for this basename: sdes,  specrequest,  cult,  reptstatus    Cardiac Enzymes:  Recent Labs Lab 04/14/14 1549 04/15/14 0436 04/15/14 0940 04/15/14 1405 04/15/14 2013  TROPONINI <0.30 <0.30 <0.30 <0.30 <0.30    Studies/Results: Dg Chest 2 View  04/15/2014   CLINICAL DATA:  Shortness of breath.  Smoker.  EXAM: CHEST  2 VIEW  COMPARISON:  04/11/2014  FINDINGS: Shallow inspiration. The heart size and mediastinal contours are within normal limits. Both lungs are clear. The visualized skeletal structures are  unremarkable.  IMPRESSION: No active cardiopulmonary disease.   Electronically Signed   By: Burman NievesWilliam  Stevens M.D.   On: 04/15/2014 05:17   Medications:   . amitriptyline  50 mg Oral QHS  . amLODipine  10 mg Oral Daily  . aspirin EC  81 mg Oral Daily  . carvedilol  6.25 mg Oral BID WC  . cloNIDine  0.1 mg Oral Once  . [START ON 04/23/2014] darbepoetin (ARANESP) injection - DIALYSIS  25 mcg Intravenous Q Thu-HD  . dextromethorphan-guaiFENesin  1 tablet Oral BID  . doxercalciferol  7 mcg Intravenous Q T,Th,Sa-HD  . ferric gluconate (FERRLECIT/NULECIT) IV  62.5 mg Intravenous Q Thu-HD  . FLUoxetine  20 mg Oral Daily  . heparin  5,000 Units Subcutaneous 3 times per day  . lamiVUDine  50 mg Oral Daily  . multivitamin  1 tablet Oral QHS  . pantoprazole  40 mg Oral Daily  . raltegravir  400 mg Oral BID  . senna-docusate  2 tablet Oral Daily  . sevelamer carbonate  2,400 mg Oral TID WC  . sodium chloride  3 mL Intravenous Q12H  . [START ON 04/17/2014] tenofovir  300 mg Oral Q Fri

## 2014-04-16 NOTE — Procedures (Signed)
I was present at this session.  I have reviewed the session itself and made appropriate changes.  Bp ^ to start . To get 4 liters off. Little insight   [name] L 7/9/20152:19 PM

## 2014-04-16 NOTE — Clinical Social Work Psychosocial (Signed)
Clinical Social Work Department BRIEF PSYCHOSOCIAL ASSESSMENT 04/16/2014  Patient:  ALISHBA, NAPLES ANN     Account Number:  [id number]     Admit date:  04/15/2014  Clinical Social Worker:  Wylene Men  Date/Time:  04/16/2014 01:41 PM  Referred by:  RN  Date Referred:  04/16/2014 Referred for  Other - See comment   Other Referral:   assistance with transportation to and from Dialysis   Interview type:  Patient Other interview type:   none    PSYCHOSOCIAL DATA Living Status:  FAMILY Admitted from facility:   Level of care:   Primary support name:  Tehea Primary support relationship to patient:  FAMILY Degree of support available:   adequate to fair.  pt has neice who is supportive    CURRENT CONCERNS Current Concerns  Post-Acute Placement   Other Concerns:   transportation to and from dialysis    [place] / [place] CSW met with pt at bedside.  Pt was sitting up in the bed calm and cooperative during the course of the assessment. Pt was alert and oriented x4.  Pt states that she was recently in a group  home in [place], but left there due to social issues with other residents.  Pt states that as one week ago, she lives with her neice and her two grown children.  Pt reports neice has agreed to care for her.  Pt also reports that home health has been set up for her.    CSW contacted pt's dialysis center [phone] and spoke with the social worker who assisted this CSW with transportation requests.  Dialysis center states that pt is to call Medicaid for transportation.  CSW spoke with pt and relayed the number to [facility] transportation [phone].  Pt is agreeable to set up dialysis transportation this afternoon. No other CSW needs identified.  CSW signing off.   Assessment/plan status:  Information/Referral to [employer] Other assessment/ plan:   none   Information/referral to community resources:   medicaid transportation    PATIENT'S/FAMILY'S  RESPONSE TO PLAN OF CARE: Pt is agreeable to contact Medicaid transportation to arrange transportation to and from Dialysis starting Saturday.       Nonnie Done, [place] [phone]  Clinical Social Work

## 2014-04-17 DIAGNOSIS — Z91199 Patient's noncompliance with other medical treatment and regimen due to unspecified reason: Secondary | ICD-10-CM

## 2014-04-17 DIAGNOSIS — I158 Other secondary hypertension: Secondary | ICD-10-CM

## 2014-04-17 DIAGNOSIS — Z9119 Patient's noncompliance with other medical treatment and regimen: Secondary | ICD-10-CM

## 2014-04-17 LAB — HEPATITIS B SURFACE ANTIGEN: Hepatitis B Surface Ag: NEGATIVE

## 2014-04-17 MED ORDER — CLONIDINE HCL 0.1 MG PO TABS
0.1000 mg | ORAL_TABLET | Freq: Once | ORAL | Status: DC
  Filled 2014-04-17: qty 1

## 2014-04-17 MED ORDER — CARVEDILOL 6.25 MG PO TABS: 6.2500 mg | ORAL_TABLET | Freq: Two times a day (BID) | ORAL | Status: DC

## 2014-04-17 MED ORDER — GUAIFENESIN-DM 100-10 MG/5ML PO SYRP: 5.0000 mL | ORAL_SOLUTION | ORAL | Status: DC | PRN

## 2014-04-17 MED ORDER — AMLODIPINE BESYLATE 10 MG PO TABS: 10.0000 mg | ORAL_TABLET | Freq: Every day | ORAL | Status: DC

## 2014-04-17 MED ORDER — HYDRALAZINE HCL 25 MG PO TABS
25.0000 mg | ORAL_TABLET | Freq: Three times a day (TID) | ORAL | Status: DC
  Filled 2014-04-17 (×3): qty 1

## 2014-04-17 MED ORDER — HYDRALAZINE HCL 20 MG/ML IJ SOLN: 5.0000 mg | Freq: Once | INTRAMUSCULAR | Status: DC

## 2014-04-17 MED ORDER — PREDNISONE 20 MG PO TABS: ORAL_TABLET | ORAL | Status: DC

## 2014-04-17 MED ORDER — HYDRALAZINE HCL 25 MG PO TABS: 25.0000 mg | ORAL_TABLET | Freq: Three times a day (TID) | ORAL | Status: DC

## 2014-04-17 MED ORDER — IPRATROPIUM-ALBUTEROL 0.5-2.5 (3) MG/3ML IN SOLN: 3.0000 mL | Freq: Four times a day (QID) | RESPIRATORY_TRACT | Status: DC | PRN

## 2014-04-17 NOTE — Progress Notes (Signed)
Patient was very uncooperative and cursing this morning.  She was supposed to be discharged today and the staff was in the process.  Patient's BP was high and was ordered some prn BP medicines and wanted to discharged her after she is stabilized, but patient refused to take pills and refused discharged teaching.  Patient was explained and educated about high blood pressure and its consequences but she signed AMA.  She even refused to take her prescriptions with her.  MD made aware.  She was accompanied to the car by staff.

## 2014-04-17 NOTE — Discharge Summary (Signed)
Physician Discharge Summary  Jean CooleyMargaret Ann Whiteaker ZOX:[id number] DOB: [birth year] DOA: 04/15/2014  PCP: No PCP Per Patient  Admit date: 04/15/2014 Discharge date: 04/17/2014  PATIENT LEFT AMA  Discharge Diagnoses:  Principal Problem:   Respiratory failure Active Problems:   HIV infection   ESRD (end stage renal disease)   Acute respiratory failure with hypoxemia   Filed Weights   04/16/14 1418 04/16/14 1818 04/16/14 2218  Weight: 115.4 kg (254 lb 6.6 oz) 110 kg (242 lb 8.1 oz) 109.3 kg (240 lb 15.4 oz)    History of present illness/Hospital Course:  [name] is a 63 y.o. female with history of hypertension, congestive heart failure, HIV, end-stage renal disease on hemodialysis, currently no outpatient hemodialysis center set up, depression/anxiety, acid reflux, arthritis who presents with shortness of breath. She recently left the hospital against medical advised and out-patient dialysis couldn't be set up. Patient return with SOB, chest pain that started 2 days prior to admission. She describes chest pain as tightness, lasted for few minutes, resolved with oxygen. She was admitted for acute respiratory failure probably secondary to volume overload from missing her HD. She underwent HD and her symptoms resolved. Her BP remained elevated. But patient left AMA, did not want to wait for medications.    Procedures:HD  Consultations:  Renal.   Discharge Exam: Filed Vitals:   04/17/14 0958  BP: 201/112  Pulse: 102  Temp: 98.1 F (36.7 C)  Resp: 21    General: alert afebrile very anxious.  Cardiovascular: s1s2 Respiratory: ctab  Discharge Instructions You were cared for by a hospitalist during your hospital stay. If you have any questions about your discharge medications or the care you received while you were in the hospital after you are discharged, you can call the unit and asked to speak with the hospitalist on call if the hospitalist that took care of you is not  available. Once you are discharged, your primary care physician will handle any further medical issues. Please note that NO REFILLS for any discharge medications will be authorized once you are discharged, as it is imperative that you return to your primary care physician (or establish a relationship with a primary care physician if you do not have one) for your aftercare needs so that they can reassess your need for medications and monitor your lab values.  Discharge Instructions   Discharge instructions    Complete by:  As directed   Follow up nephrology as recommended.  Follow up with HD tomorrow.            Medication List         amitriptyline 50 MG tablet  Commonly known as:  ELAVIL  Take 50 mg by mouth at bedtime.     amLODipine 10 MG tablet  Commonly known as:  NORVASC  Take 1 tablet (10 mg total) by mouth at bedtime.     aspirin EC 81 MG tablet  Take 81 mg by mouth daily.     BC HEADACHE PO  Take 2 packets by mouth 2 (two) times daily as needed. pain     carvedilol 6.25 MG tablet  Commonly known as:  COREG  Take 1 tablet (6.25 mg total) by mouth 2 (two) times daily with a meal.     diclofenac sodium 1 % Gel  Commonly known as:  VOLTAREN  Apply 4 g topically 4 (four) times daily as needed (pain).     diphenhydrAMINE 50 MG capsule  Commonly known as:  BENADRYL  Take 50 mg by mouth at bedtime as needed for sleep.     FLUoxetine 20 MG capsule  Commonly known as:  PROZAC  Take 20 mg by mouth daily.     guaiFENesin-dextromethorphan 100-10 MG/5ML syrup  Commonly known as:  ROBITUSSIN DM  Take 5 mLs by mouth every 4 (four) hours as needed for cough.     HYDROcodone-acetaminophen 5-325 MG per tablet  Commonly known as:  NORCO/VICODIN  Take 1 tablet by mouth every 6 (six) hours as needed for moderate pain.     hydrOXYzine 25 MG tablet  Commonly known as:  ATARAX/VISTARIL  Take 25 mg by mouth 3 (three) times daily.     ipratropium-albuterol 0.5-2.5 (3) MG/3ML  Soln  Commonly known as:  DUONEB  Take 3 mLs by nebulization every 6 (six) hours as needed.     lamiVUDine 10 MG/ML solution  Commonly known as:  EPIVIR  Take 50 mg by mouth daily.     multivitamin Tabs tablet  Take 1 tablet by mouth daily.     omeprazole 20 MG capsule  Commonly known as:  PRILOSEC  Take 20 mg by mouth daily.     polyethylene glycol packet  Commonly known as:  MIRALAX / GLYCOLAX  Take 17 g by mouth daily as needed for mild constipation.     predniSONE 20 MG tablet  Commonly known as:  DELTASONE  Prednisone 20 mg daily for 3 days and stop.     Protein Powd  Take 1 scoop by mouth 3 (three) times daily. Mixed with applesauce     raltegravir 400 MG tablet  Commonly known as:  ISENTRESS  Take 400 mg by mouth 2 (two) times daily.     senna-docusate 8.6-50 MG per tablet  Commonly known as:  Senokot-S  Take 2 tablets by mouth daily.     sevelamer carbonate 800 MG tablet  Commonly known as:  RENVELA  Take 1,600-2,400 mg by mouth 3 (three) times daily with meals. *takes 2400mg  with meals and 1600mg  with snacks*     tenofovir 300 MG tablet  Commonly known as:  VIREAD  Take 300 mg by mouth every Friday. Takes on Fridays.       Allergies  Allergen Reactions  . Tramadol     "Gives me bad dreams"  . Morphine And Related Hives and Rash      The results of significant diagnostics from this hospitalization (including imaging, microbiology, ancillary and laboratory) are listed below for reference.    Significant Diagnostic Studies: Dg Chest 2 View  04/15/2014   CLINICAL DATA:  Shortness of breath.  Smoker.  EXAM: CHEST  2 VIEW  COMPARISON:  04/11/2014  FINDINGS: Shallow inspiration. The heart size and mediastinal contours are within normal limits. Both lungs are clear. The visualized skeletal structures are unremarkable.  IMPRESSION: No active cardiopulmonary disease.   Electronically Signed   By: Burman Nieves M.D.   On: 04/15/2014 05:17   Dg Chest Port 1  View  04/13/2014   CLINICAL DATA:  Shortness of breath, cough and congestion. History of end-stage renal disease.  EXAM: PORTABLE CHEST - 1 VIEW  COMPARISON:  01/06/2014  FINDINGS: Stable mild cardiac enlargement and tortuosity of the thoracic aorta. Stable scarring in the left mid lung. No overt pulmonary edema, airspace consolidation or pleural fluid.  IMPRESSION: No overt edema or infiltrates.   Electronically Signed   By: Irish Lack M.D.   On: 04/13/2014 07:50    Microbiology:  Recent Results (from the past 240 hour(s))  MRSA PCR SCREENING     Status: None   Collection Time    04/15/14  6:54 PM      Result Value Ref Range Status   MRSA by PCR NEGATIVE  NEGATIVE Final   Comment:            The GeneXpert MRSA Assay (FDA     approved for NASAL specimens     only), is one component of a     comprehensive MRSA colonization     surveillance program. It is not     intended to diagnose MRSA     infection nor to guide or     monitor treatment for     MRSA infections.     Labs: Basic Metabolic Panel:  Recent Labs Lab 04/11/14 1205 04/12/14 0510 04/14/14 1549 04/15/14 0435 04/16/14 0356  NA 136* 136* 138 137 137  K 5.4* 4.3 6.4* 5.7* 4.9  CL 100 94* 95* 95* 95*  CO2  --  25 22 20 24   GLUCOSE 96 82 128* 125* 104*  BUN 56* 24* 73* 82* 37*  CREATININE 15.60* 7.70* 12.03* 12.57* 7.22*  CALCIUM  --  8.6 8.4 8.6 8.8  PHOS  --  4.4  --   --   --    Liver Function Tests:  Recent Labs Lab 04/12/14 0510 04/14/14 1549 04/16/14 0356  AST  --  14 12  ALT  --  <5 <5  ALKPHOS  --  328* 271*  BILITOT  --  <0.2* 0.2*  PROT  --  7.6 7.3  ALBUMIN 3.4* 3.5 3.3*   No results found for this basename: LIPASE, AMYLASE,  in the last 168 hours No results found for this basename: AMMONIA,  in the last 168 hours CBC:  Recent Labs Lab 04/11/14 1205 04/14/14 1549 04/15/14 0435 04/16/14 0356  WBC  --  7.7 7.6 7.1  NEUTROABS  --   --  4.9  --   HGB 12.9 10.8* 10.1* 9.9*  HCT 38.0  33.9* 31.5* 31.4*  MCV  --  105.6* 106.1* 107.2*  PLT  --  235 222 223   Cardiac Enzymes:  Recent Labs Lab 04/14/14 1549 04/15/14 0436 04/15/14 0940 04/15/14 1405 04/15/14 2013  TROPONINI <0.30 <0.30 <0.30 <0.30 <0.30   BNP: BNP (last 3 results)  Recent Labs  10/06/13 0334 10/21/13 2037 11/08/13 1903  PROBNP 31834.0* 23932.0* 42892.0*   CBG: No results found for this basename: GLUCAP,  in the last 168 hours     Signed:  Chara Marquard  [facility] 04/17/2014, 10:02 AM

## 2014-04-17 NOTE — Care Management Note (Addendum)
[facility] however pt left before CM could arrange any HH services. Address of niece with whom the pt will be living is unknown therefore this [facility]. Pt ambulatory and no skilled needs identified at this time. RN may have been recommended for med management however pt left without prescriptions. Pt has been setup for [facility] hemod[facility] as previously documented, [facility] Th, Sat , 2nd shift. This information was given to the pt on 04/15/2014.  Johny Shockheryl Rayel Santizo RN MPH, case manager, [phone]

## 2014-04-17 NOTE — Progress Notes (Signed)
Subjective:  Feeling better, breathing well; hoping for discharge, [facility]  Objective: Vital signs in last 24 hours: Temp:  [98 F (36.7 C)-99.7 F (37.6 C)] 98.8 F (37.1 C) (07/10 0614) Pulse Rate:  [91-108] 96 (07/10 0614) Resp:  [16-20] 20 (07/10 0614) BP: (148-198)/(78-104) 181/98 mmHg (07/10 0614) SpO2:  [95 %-100 %] 100 % (07/10 0614) Weight:  [109.3 kg (240 lb 15.4 oz)-115.4 kg (254 lb 6.6 oz)] 109.3 kg (240 lb 15.4 oz) (07/09 2218) Weight change: -2.5 kg (-5 lb 8.2 oz)  Intake/Output from previous day: 07/09 0701 - 07/10 0700 In: 720 [P.O.:720] Out: 5500  Intake/Output this shift: Total I/O In: 480 [P.O.:480] Out: -   Lab Results:  Recent Labs  04/15/14 0435 04/16/14 0356  WBC 7.6 7.1  HGB 10.1* 9.9*  HCT 31.5* 31.4*  PLT 222 223   BMET:  Recent Labs  04/14/14 1549 04/15/14 0435 04/16/14 0356  NA 138 137 137  K 6.4* 5.7* 4.9  CL 95* 95* 95*  CO2 22 20 24   GLUCOSE 128* 125* 104*  BUN 73* 82* 37*  CREATININE 12.03* 12.57* 7.22*  CALCIUM 8.4 8.6 8.8  ALBUMIN 3.5  --  3.3*   No results found for this basename: PTH,  in the last 72 hours Iron Studies: No results found for this basename: IRON, TIBC, TRANSFERRIN, FERRITIN,  in the last 72 hours  Studies/Results: No results found.  Physical Exam: General:  Alert, in no apparent distress Chest:  Mild bilateral expiratory wheezes Heart:  RRR without murmur or rub GI:  + BS, soft and nontender Extremities:  Trace LE edema Dialysis Access:  AVF @ LFA with + bruit  Dialysis Orders: Center: [facility] on TTS ([place] in [place])  EDW 110kg HD Bath 2K/2.25Ca Time 4000 Heparin 7000. Access LFA AVF  Hectorol 7 mcg IV/HD Aranesp (stopped, Hgb at goal) Units IV/HD Venofer 50 mg IV weekly   Assessment/Plan: 1. Acute hypoxic respiratory failure - Secondary to missing HD, some improvement s/p HD last 2 days.  2. Chest pain - Troponins neg, echo yesterday showed  moderately reduced systolic fxn, Gr 1 diastolic dysfxn, EF 35-40%.  3. Lower extremity pain/edema - Duplex inconclusive.  4. ESRD/Hyperkalemia - HD on TTS @ [facility], K 4.9.  Next HD tomorrow.  5. HTN/volume - SBPs variable, 181/98 most recently, on Amlodipine 10 mg qd, Coreg 6.25 mg bid; now below EDW.  Continue to challenge and lower EDW. 6. Anemia - Hgb 9.9, Aranesp 25 mcg resumed on Thurs, weekly Fe.  7. Metabolic bone disease - Ca 8.8 (9.4 corrected), Phos 4.4, last PTH 790 on 6/18; Hectorol 7 mcg, binders.  8. Nutrition - Renal diet/multivitamins.  9. Depression - with emotional lability/history of disruptive behavior, on Prozac. 10. HIV+ - per primary. 11. Substance abuse/Tobacco abuse/medical non-compliance - Complicated by difficult social situation, poor insight, and history of disruptive behavior; [facility], currently working to secure transportation. Appreciate CM assistance.     LOS: 2 days   [name],CHARLES 04/17/2014,9:24 AM  Renal Attending:  I agree with the evaluation and management as articulated above by Mr. Creola CornLyles Zaydee Aina C

## 2014-04-18 NOTE — ED Provider Notes (Signed)
I saw and evaluated the patient, reviewed the resident's note and I agree with the findings and plan.   .Face to face Exam:  General:  Awake HEENT:  Atraumatic Resp:  Normal effort Abd:  Nondistended Neuro:No focal weakness  ekg reviewed and discussed with resident  Nelia Shiobert L Oluwatobi Ruppe, MD 04/18/14 1246

## 2014-04-19 ENCOUNTER — Emergency Department (HOSPITAL_COMMUNITY): Payer: Medicare Other

## 2014-04-19 ENCOUNTER — Emergency Department (HOSPITAL_COMMUNITY)
Admission: EM | Admit: 2014-04-19 | Discharge: 2014-04-19 | Disposition: A | Discharge status: Home / Self Care | Payer: Medicare Other | Source: Home / Self Care | Attending: Emergency Medicine | Admitting: Emergency Medicine

## 2014-04-19 ENCOUNTER — Encounter (HOSPITAL_COMMUNITY): Payer: Self-pay | Admitting: Emergency Medicine

## 2014-04-19 DIAGNOSIS — I509 Heart failure, unspecified: Secondary | ICD-10-CM

## 2014-04-19 DIAGNOSIS — Z862 Personal history of diseases of the blood and blood-forming organs and certain disorders involving the immune mechanism: Secondary | ICD-10-CM

## 2014-04-19 DIAGNOSIS — Z791 Long term (current) use of non-steroidal anti-inflammatories (NSAID): Secondary | ICD-10-CM

## 2014-04-19 DIAGNOSIS — Z91199 Patient's noncompliance with other medical treatment and regimen due to unspecified reason: Secondary | ICD-10-CM

## 2014-04-19 DIAGNOSIS — K219 Gastro-esophageal reflux disease without esophagitis: Secondary | ICD-10-CM

## 2014-04-19 DIAGNOSIS — Z8701 Personal history of pneumonia (recurrent): Secondary | ICD-10-CM | POA: Insufficient documentation

## 2014-04-19 DIAGNOSIS — Z992 Dependence on renal dialysis: Secondary | ICD-10-CM | POA: Insufficient documentation

## 2014-04-19 DIAGNOSIS — Z21 Asymptomatic human immunodeficiency virus [HIV] infection status: Secondary | ICD-10-CM | POA: Insufficient documentation

## 2014-04-19 DIAGNOSIS — IMO0002 Reserved for concepts with insufficient information to code with codable children: Secondary | ICD-10-CM

## 2014-04-19 DIAGNOSIS — N186 End stage renal disease: Secondary | ICD-10-CM

## 2014-04-19 DIAGNOSIS — Z7982 Long term (current) use of aspirin: Secondary | ICD-10-CM | POA: Insufficient documentation

## 2014-04-19 DIAGNOSIS — R0602 Shortness of breath: Secondary | ICD-10-CM | POA: Diagnosis not present

## 2014-04-19 DIAGNOSIS — F329 Major depressive disorder, single episode, unspecified: Secondary | ICD-10-CM

## 2014-04-19 DIAGNOSIS — Z9119 Patient's noncompliance with other medical treatment and regimen: Secondary | ICD-10-CM

## 2014-04-19 DIAGNOSIS — R0789 Other chest pain: Secondary | ICD-10-CM

## 2014-04-19 DIAGNOSIS — M129 Arthropathy, unspecified: Secondary | ICD-10-CM | POA: Insufficient documentation

## 2014-04-19 DIAGNOSIS — F411 Generalized anxiety disorder: Secondary | ICD-10-CM

## 2014-04-19 DIAGNOSIS — I209 Angina pectoris, unspecified: Secondary | ICD-10-CM

## 2014-04-19 DIAGNOSIS — Z9889 Other specified postprocedural states: Secondary | ICD-10-CM | POA: Insufficient documentation

## 2014-04-19 DIAGNOSIS — D72829 Elevated white blood cell count, unspecified: Secondary | ICD-10-CM | POA: Insufficient documentation

## 2014-04-19 DIAGNOSIS — R000 Tachycardia, unspecified: Secondary | ICD-10-CM

## 2014-04-19 DIAGNOSIS — F17200 Nicotine dependence, unspecified, uncomplicated: Secondary | ICD-10-CM | POA: Insufficient documentation

## 2014-04-19 DIAGNOSIS — I120 Hypertensive chronic kidney disease with stage 5 chronic kidney disease or end stage renal disease: Secondary | ICD-10-CM | POA: Insufficient documentation

## 2014-04-19 DIAGNOSIS — J811 Chronic pulmonary edema: Secondary | ICD-10-CM | POA: Diagnosis not present

## 2014-04-19 DIAGNOSIS — F3289 Other specified depressive episodes: Secondary | ICD-10-CM | POA: Insufficient documentation

## 2014-04-19 LAB — COMPREHENSIVE METABOLIC PANEL
ALT: 13 U/L (ref 0–35)
AST: 23 U/L (ref 0–37)
Albumin: 3.6 g/dL (ref 3.5–5.2)
Alkaline Phosphatase: 282 U/L — ABNORMAL HIGH (ref 39–117)
Anion gap: 26 — ABNORMAL HIGH (ref 5–15)
BUN: 73 mg/dL — ABNORMAL HIGH (ref 6–23)
CO2: 21 mEq/L (ref 19–32)
Calcium: 9 mg/dL (ref 8.4–10.5)
Chloride: 94 mEq/L — ABNORMAL LOW (ref 96–112)
Creatinine, Ser: 9.86 mg/dL — ABNORMAL HIGH (ref 0.50–1.10)
GFR calc non Af Amer: 4 mL/min — ABNORMAL LOW (ref >90)
GFR, EST AFRICAN AMERICAN: 4 mL/min — AB (ref >90)
GLUCOSE: 94 mg/dL (ref 70–99)
Potassium: 5.3 mEq/L (ref 3.7–5.3)
SODIUM: 141 meq/L (ref 137–147)
TOTAL PROTEIN: 7.9 g/dL (ref 6.0–8.3)
Total Bilirubin: 0.2 mg/dL — ABNORMAL LOW (ref 0.3–1.2)

## 2014-04-19 LAB — PRO B NATRIURETIC PEPTIDE: Pro B Natriuretic peptide (BNP): 32641 pg/mL — ABNORMAL HIGH (ref 0–125)

## 2014-04-19 LAB — CBC WITH DIFFERENTIAL/PLATELET
BASOS ABS: 0 10*3/uL (ref 0.0–0.1)
Basophils Relative: 0 % (ref 0–1)
EOS ABS: 0.1 10*3/uL (ref 0.0–0.7)
Eosinophils Relative: 1 % (ref 0–5)
HCT: 33.5 % — ABNORMAL LOW (ref 36.0–46.0)
Hemoglobin: 10.9 g/dL — ABNORMAL LOW (ref 12.0–15.0)
Lymphocytes Relative: 36 % (ref 12–46)
Lymphs Abs: 4 10*3/uL (ref 0.7–4.0)
MCH: 34.2 pg — ABNORMAL HIGH (ref 26.0–34.0)
MCHC: 32.5 g/dL (ref 30.0–36.0)
MCV: 105 fL — AB (ref 78.0–100.0)
Monocytes Absolute: 1 10*3/uL (ref 0.1–1.0)
Monocytes Relative: 9 % (ref 3–12)
Neutro Abs: 5.8 10*3/uL (ref 1.7–7.7)
Neutrophils Relative %: 54 % (ref 43–77)
Platelets: 315 10*3/uL (ref 150–400)
RBC: 3.19 MIL/uL — ABNORMAL LOW (ref 3.87–5.11)
RDW: 13.8 % (ref 11.5–15.5)
WBC: 11 10*3/uL — ABNORMAL HIGH (ref 4.0–10.5)

## 2014-04-19 LAB — I-STAT TROPONIN, ED: Troponin i, poc: 0.02 ng/mL (ref 0.00–0.08)

## 2014-04-19 LAB — TROPONIN I: Troponin I: <0.3 ng/mL (ref <0.30)

## 2014-04-19 MED ORDER — SODIUM POLYSTYRENE SULFONATE 15 GM/60ML PO SUSP
30.0000 g | Freq: Once | ORAL | Status: AC
  Administered 2014-04-19: 30 g via ORAL
  Filled 2014-04-19 (×2): qty 120

## 2014-04-19 MED ORDER — ONDANSETRON 4 MG PO TBDP
4.0000 mg | ORAL_TABLET | Freq: Once | ORAL | Status: AC
  Administered 2014-04-19: 4 mg via ORAL
  Filled 2014-04-19: qty 1

## 2014-04-19 NOTE — ED Notes (Signed)
Patient with complaints of nausea.  Will administer zofran.  She has been advised that she will need to wait prior to po intake due to nausea

## 2014-04-19 NOTE — Discharge Instructions (Signed)
Please call your doctor for a followup appointment within 24-48 hours. When you talk to your doctor please let them know that you were seen in the emergency department and have them acquire all of your records so that they can discuss the findings with you and formulate a treatment plan to fully care for your new and ongoing problems. Please rest and stay hydrated Tomorrow you have an appointment with Dialysis Center - please go to this appointment. 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 You will then need to call the office and see if the doctor you have chosen will accept you as a new patient and what types of options they offer for patients who are self-pay. Some doctors offer discounts or will set up payment plans for their patients who do not have insurance, but you will need to ask so you aren't surprised when you get to your appointment.  2) Contact Your Local Health Department Not all health departments have doctors that can see patients for sick visits, but many do, so it is worth a call to see if yours does. If you don't know where your local health department is, you can check in your phone book. The CDC also has a tool to help you locate your state's health department, and many state websites also have listings of all of their local health departments.  3) Find a Walk-in Clinic If your illness is not likely to be very severe or complicated, you may want to try a walk in clinic. These are popping up all over the country in pharmacies, drugstores, and shopping centers. They're usually staffed by  nurse practitioners or physician assistants that have been trained to treat common illnesses and complaints. They're usually fairly quick and inexpensive. However, if you have serious medical issues or chronic medical problems, these are probably not your best option.  No Primary Care Doctor: - Call Health Connect at  [phone] - they can help you locate a primary care doctor that  accepts your insurance, provides certain services, etc. - Physician Referral Service- [phone]  Chronic Pain Problems: Organization         Address  Phone   Notes  [facility]  [phone] Patients need to be referred by their primary care doctor.   Medication Assistance: Organization         Address  Phone   Notes  [facility] Medication [facility] [address] [place] [place], [place] [address] [phone] --Must be a resident of [facility] -- Must have NO insurance coverage whatsoever (no Medicaid/ Medicare, etc.) -- The pt. MUST have a primary care doctor that directs their care regularly and follows them in the community   [facility]  [phone]   [employer]  [phone]    Agencies that provide inexpensive medical care: Organization         Address  Phone   Notes  [facility]  [phone]   [facility]    [phone]   [facility]  [address] [place], [place] [address] [phone]   [facility] [address] [place] [place] [phone]   Planned Parenthood    [phone]   [facility]    [phone]   Community Health and [facility]  [address], [place] Phone:  [phone], Fax:  [phone] Hours of Operation:  9 am - 6 pm, M-F.  Also accepts Medicaid/Medicare and self-pay.  [facility]  [address], [place] Phone: [phone], Fax: [phone]. Hours of Operation:  8:30  am - 5:30 pm, M-F.  Also accepts Medicaid and self-pay.  [facility] [place] [address] [place] Phone: [phone]   [facility] [address] Natasha Bence [place], [place] [phone], Ext. [phone] Mondays & Thursdays: 7-9 AM.  First 15 patients are seen on a first come, first serve basis.    Medicaid-accepting [facility] Providers:  Organization         Address  Phone   Notes  [facility] [address], [place] [phone] Also accepts self-pay patients.  [facility] [address] Laurell Josephs [place], [place]  [phone]   [facility] [address], [place] [phone]   [facility] [address] [place] [phone]   Renaye Rakers [address], [place]   [phone] Only accepts [place] Access [place] patients after they have their name applied to their card.   Self-Pay (no insurance) in [facility]:  Organization         Address  Phone   Notes  Sickle Cell Patients, [facility] [address] [place], [place] [phone]   [facility] [address] [place], [place] [phone]   [facility] [place]  [address], [place] [phone]   [facility]/Dr. Osei-Bonsu  [address], [place] or [address], [place] [phone] Phone number for both [place] and [place] locations is the same.  Urgent Medical and [facility] [address] [place] [phone]   [facility] [address], [place] or [address] [phone]   [facility] [address] [place] [phone], phone; [phone], fax Sees patients 1st and 3rd Saturday of every month.  Must not qualify for public or private insurance (i.e. Medicaid, Medicare, [place] Health  Choice, Veterans' Benefits)  Household income should be no more than 200% of the poverty level The clinic cannot treat you if you are pregnant or think you are pregnant  Sexually transmitted diseases are not treated at the clinic.    Dental Care: Organization         Address  Phone  Notes  [facility] Department of [facility] [address] [place], [place] [phone] Accepts children up to age 23 who are enrolled in [place] or [place] Health Choice; pregnant women with a Medicaid card; and children who have applied for Medicaid or [place] Health Choice, but were declined, whose parents can pay a reduced fee at time of service.  [facility] Department of [facility]  [address], [place] [phone] Accepts children up to age 86 who are enrolled in [place]  or [place] Health Choice; pregnant women with a Medicaid card; and children who have applied for Medicaid or [place] Health Choice, but were declined, whose parents can pay a reduced fee at time of service.  [facility]  [address], [place] [phone] Patients are seen by appointment only. Walk-ins are not accepted. [facility] will see patients 62 years of age and older. Monday - Tuesday (8am-5pm) Most Wednesdays (8:30-5pm) $30 per visit, cash only  [facility]  [address], [facility] [place] [phone] Patients are seen by appointment only. Walk-ins are not accepted. [facility] will see patients 62 years of age and older. One Wednesday Evening (Monthly: Volunteer Based).  $30 per visit, cash only  [employer] [facility]  [phone] for adults; Children under age 424, call Graduate Pediatric Dentistry at [phone]) [phone]. Children aged 784-14, please call [phone] to request a pediatric application.  Dental services are provided in all areas of dental care including fillings, crowns and bridges, complete  and partial dentures, implants, gum treatment, root canals, and extractions. Preventive care is also provided. Treatment is provided to both adults and children. Patients are selected via a lottery and there is often a waiting list.   [facility] [address], [place]  [phone] [email or web address]   [facility] [address], [place], [place] [phone], Ext. [phone] Second and Fourth Thursday of each month, opens at 6:30 AM; Clinic ends at 9 AM.  Patients are seen on a first-come first-served basis, and a limited number are seen during each clinic.   [facility]  [address], [place], [place] [phone]   Eligibility Requirements You must have lived in [place], [place], or [place] counties for at least the last three months.   You cannot be eligible for state or federal sponsored National Cityhealthcare insurance, including CIGNAVeterans Administration, [place]icaid, or [employer]icare.   You generally cannot be eligible for healthcare insurance through your employer.    How to apply: Eligibility screenings are held every Tuesday and Wednesday afternoon from 1:00 pm until 4:00 pm. You do not need an appointment for the interview!  [facility] [address], [place], [place] [phone]   [facility]  [phone]   [facility]  [phone]   [facility]  [phone]    Behavioral Health Resources in the Community: Intensive Outpatient Programs Organization         Address  Phone  Notes  [facility] [address], [place], [place] [phone]   [facility] Outpatient [address], [place], [place] [phone]   [facility] [address], [place], [place]  [phone]   [facility] [address],  [place], [place] [phone] or [phone]   Substance Abuse Resources Organization          Address  Phone  Notes  [facility]  [phone]   [facility]  [phone]   [facility]  [phone]   [facility]  [phone]   Residential & Outpatient Substance Abuse Program  [phone]   Psychological Services Organization         Address  Phone  Notes  [facility]  [phone]- [phone]   [facility]  [phone]- [phone]   [facility] [address], [place] [phone] or [phone]  Mobile Crisis Teams Organization         Address  Phone  Notes  [facility], Mobile Crisis Care Unit  [phone]   [facility]  [address] [place], [place]   [place] Levels [address], [place] [phone]    Self-Help/Support Groups Organization         Address  Phone             Notes  [facility] [place] - variety of support groups  Decker Call for more information  Narcotics Anonymous (NA), [facility] [address], [employer] [place]  2 meetings at this location   Special educational needs teacher         Address  Phone  Notes  [facility]AP Residential [facility] [place],    [place]  [phone]   [facility]  [address] [place] [phone], [place], [place]   [place], [place] [phone] Admissions: 8am-3pm M-F  Incentives Substance [place] [address]    [place], [place] [phone]   [facility] [address] [place], [place], [place]   [facility] [address]  [place], [place]   [facility] - Intensive Outpatient [address] Kristeen [address], [place], [place]   [facility] ([place].,  [place], [place] [phone] or [phone]   [facility] (RTS) [address], [place], [place] Accepts Medicaid  [facility] [place] [address],  [place] [phone] Substance Abuse/Addiction Treatment   [facility] Organization         Address  Phone  Notes  [facility]  [phone]   Domenic Schwab, PhD [address] [place], [place]   [phone] or [phone]   [place], [place] [phone]   [facility] [address], [place], [place] [phone] Insurance/Medicaid/sponsorship through [facility] and Families [address] [place]                                    [place], [place] [phone] [place] [address], [place] [phone]    Dr. Adele Schilder  [phone]   [facility] Dept. 1) [address], [place] 2) [place] 3)  [address], [place] [phone]  [phone]   [place] [phone] or [phone] (After Hours)

## 2014-04-19 NOTE — ED Provider Notes (Signed)
CSN: [id number]     Arrival date & time 04/19/14  [id number] History   First MD Initiated Contact with Patient 04/19/14 [phone]     Chief Complaint  Patient presents with  . Shortness of Breath  . Chest Pain     (Consider location/radiation/quality/duration/timing/severity/associated sxs/prior Treatment) The history is provided by the patient. No language interpreter was used.  Zettie CooleyMargaret Ann Vanaman is a 63 year old female with past medical history of HIV, end-stage renal disease on dialysis Tuesday/Thursday/Saturday, CHF, GERD, arthritis presenting to the ED with shortness of breath and left shoulder/chest pain that started approximately 2:00 AM. Patient reports that she has intense pain to her left shoulder that radiates to her chest feels as if there is a "chip" in her bone. Stated that she's been experiencing left-sided chest pain described as a tightness. Reported that she is feeling short of breath worse with laying down flat. Patient reported that she's been feeling weak intermittently for the past couple of days. Reported that her last dialysis session was on Thursday - stated that she missed her dialysis session on Saturday due to transportation issues. Reported that she is concerned because her next session is on Tuesday and fears she will get fluid overloaded.  Stated that she feels dry. Denied numbness, tingling, fever, chills, neck pain, neck stiffness, swelling, headache, dizziness. PCP none  Past Medical History  Diagnosis Date  . Hypertension   . HIV infection   . Morbid obesity   . ESRD (end stage renal disease) on dialysis 09/30/2013    [place] in [place], [place] around 2009.  ESRD due to HTN vs drug abuse according to pt.  Was on dialysis at [facility] until Feb 2015 when she was admitted to a SNF due to homelessness and drug abuse.  Then changed to [facility] on TTS schedule.     . Anginal pain   . CHF (congestive heart failure)   . Shortness of breath   . Pneumonia   .  Depression   . Anxiety   . GERD (gastroesophageal reflux disease)   . Arthritis   . Anemia    Past Surgical History  Procedure Laterality Date  . Arteriovenous graft placement      left forearm  . Cardiac catheterization    . Laparotomy      states seh was cut open because seh was bleeding on the inside   Family History  Problem Relation Age of Onset  . Kidney failure Other     niece  . High blood pressure    . Lung cancer    . Breast cancer Neg Hx   . Colon cancer Neg Hx   . Stroke Mother   . HIV/AIDS Brother     died of AIDS   History  Substance Use Topics  . Smoking status: Current Every Day Smoker -- 0.50 packs/day    Types: Cigarettes  . Smokeless tobacco: Never Used  . Alcohol Use: No   OB History   Grav Para Term Preterm Abortions TAB SAB Ect Mult Living                 Review of Systems  Constitutional: Negative for fever and chills.  Respiratory: Positive for shortness of breath. Negative for cough and chest tightness.   Cardiovascular: Positive for chest pain. Negative for leg swelling.  Musculoskeletal: Negative for neck pain and neck stiffness.  Neurological: Positive for weakness. Negative for dizziness.      Allergies  Tramadol and  Morphine and related  Home Medications   Prior to Admission medications   Medication Sig Start Date End Date Taking? Authorizing Provider  amitriptyline (ELAVIL) 50 MG tablet Take 50 mg by mouth at bedtime.   Yes Historical Provider, MD  amLODipine (NORVASC) 10 MG tablet Take 1 tablet (10 mg total) by mouth at bedtime. 04/17/14  Yes Kathlen Mody, MD  aspirin EC 81 MG tablet Take 81 mg by mouth daily.   Yes Historical Provider, MD  Aspirin-Salicylamide-Caffeine (BC HEADACHE PO) Take 2 packets by mouth 2 (two) times daily as needed. pain   Yes Historical Provider, MD  carvedilol (COREG) 6.25 MG tablet Take 1 tablet (6.25 mg total) by mouth 2 (two) times daily with a meal. 04/17/14  Yes Kathlen Mody, MD  diclofenac  sodium (VOLTAREN) 1 % GEL Apply 4 g topically 4 (four) times daily as needed (pain).    Yes Historical Provider, MD  diphenhydrAMINE (BENADRYL) 50 MG capsule Take 50 mg by mouth at bedtime as needed for sleep.   Yes Historical Provider, MD  FLUoxetine (PROZAC) 20 MG capsule Take 20 mg by mouth daily.   Yes Historical Provider, MD  hydrALAZINE (APRESOLINE) 25 MG tablet Take 1 tablet (25 mg total) by mouth every 8 (eight) hours. 04/17/14  Yes Kathlen Mody, MD  HYDROcodone-acetaminophen (NORCO/VICODIN) 5-325 MG per tablet Take 1 tablet by mouth every 6 (six) hours as needed for moderate pain. 04/14/14  Yes Jacquelin Hawking, MD  hydrOXYzine (ATARAX/VISTARIL) 25 MG tablet Take 25 mg by mouth 3 (three) times daily.   Yes Historical Provider, MD  lamiVUDine (EPIVIR) 10 MG/ML solution Take 50 mg by mouth daily.   Yes Historical Provider, MD  multivitamin (RENA-VIT) TABS tablet Take 1 tablet by mouth daily.   Yes Historical Provider, MD  omeprazole (PRILOSEC) 20 MG capsule Take 20 mg by mouth daily.   Yes Historical Provider, MD  polyethylene glycol (MIRALAX / GLYCOLAX) packet Take 17 g by mouth daily as needed for mild constipation.    Yes Historical Provider, MD  Protein POWD Take 1 scoop by mouth 3 (three) times daily. Mixed with applesauce   Yes Historical Provider, MD  raltegravir (ISENTRESS) 400 MG tablet Take 400 mg by mouth 2 (two) times daily.    Yes Historical Provider, MD  senna-docusate (SENOKOT-S) 8.6-50 MG per tablet Take 2 tablets by mouth daily.   Yes Historical Provider, MD  sevelamer carbonate (RENVELA) 800 MG tablet Take 1,600-2,400 mg by mouth 3 (three) times daily with meals. *takes 2400mg  with meals and 1600mg  with snacks*   Yes Historical Provider, MD  tenofovir (VIREAD) 300 MG tablet Take 300 mg by mouth every Friday. Takes on Fridays.   Yes Historical Provider, MD  guaiFENesin-dextromethorphan (ROBITUSSIN DM) 100-10 MG/5ML syrup Take 5 mLs by mouth every 4 (four) hours as needed for cough.  04/17/14   Kathlen Mody, MD  ipratropium-albuterol (DUONEB) 0.5-2.5 (3) MG/3ML SOLN Take 3 mLs by nebulization every 6 (six) hours as needed. 04/17/14   Kathlen Mody, MD  predniSONE (DELTASONE) 20 MG tablet Prednisone 20 mg daily for 3 days and stop. 04/17/14   Kathlen Mody, MD   BP 162/78  Pulse 68  Temp(Src) 97.4 F (36.3 C) (Oral)  Resp 16  SpO2 92% Physical Exam  Nursing note and vitals reviewed. Constitutional: She is oriented to person, place, and time. She appears well-developed and well-nourished. No distress.  HENT:  Head: Normocephalic and atraumatic.  Mouth/Throat: No oropharyngeal exudate.  Dry mucus membranes  Eyes: Conjunctivae  and EOM are normal. Pupils are equal, round, and reactive to light. Right eye exhibits no discharge. Left eye exhibits no discharge.  Neck: Normal range of motion. Neck supple. No tracheal deviation present.  Negative neck stiffness Negative nuchal rigidity  Negative cervical lymphadenopathy  Negative meningeal signs   Cardiovascular: Normal rate, regular rhythm and normal heart sounds.  Exam reveals no friction rub.   No murmur heard. Negative swelling or pitting edema noted to the lower extremities bilaterally  Cap refill < 3 seconds  Pulmonary/Chest: Effort normal and breath sounds normal. No respiratory distress. She has no wheezes. She has no rales.  Patient is able to speak in full sentences without difficulty  Negative use of accessory muscles Negative stridor Negative active drooling noted  Abdominal: Soft. Bowel sounds are normal. She exhibits no distension. There is no tenderness. There is no rebound and no guarding.  Negative abdominal distension  BS normoactive in all 4 quadrants Abdomen soft Negative fluid wave Negative peritoneal signs Negative rigidity or guarding noted  Musculoskeletal: Normal range of motion.  Full ROM to upper and lower extremities without difficulty noted, negative ataxia noted.  Lymphadenopathy:    She  has no cervical adenopathy.  Neurological: She is alert and oriented to person, place, and time. No cranial nerve deficit. She exhibits normal muscle tone. Coordination normal.  Cranial nerves III-XII grossly intact Strength 5+/5+ to upper and lower extremities bilaterally with resistance applied, equal distribution noted Equal grip strength   Skin: Skin is warm and dry. No rash noted. She is not diaphoretic. No erythema.  Psychiatric: She has a normal mood and affect. Her behavior is normal. Thought content normal.    ED Course  Procedures (including critical care time)  8:14 AM This provider spoke with Dr. Allena Katz - Nephrology - discussed case, history, labs, imaging, and vitals in great detail. As per Nephrologist that patient will most likely not be able to dialysis today. As per Nephrologist recommended Kayexalate 30 grams PO - reported that patient can then be discharged and that Nephrologist will consult with patient's dialysis center to set-up an appointment tomorrow.   12:04 PM This provider called [facility] - patient had an appointment yesterday, 04/18/2014 that she missed secondary to no transportation. Discussed if appointment set up for tomorrow - unable to specify secondary to office not being opened. Will have to call back tomorrow morning with a nurse at 6:00AM.  Patient was consulted with Social Worker for the second time. Patient was given resources regarding homeless shelters. Patient requesting facility where she can do physical therapy so she can learn how to walk-social worker discussed with patient that she can discuss with the primary care provider they can fill forms and she can go to any facility she would like. Patient was offered assisted living at last admission to the hospital, patient declined and stated that she will live with her niece - patient signed out AMA at that point. Patient reports that the living situation is not good and that she changed her mind  and would like to get affiliated with the facility.    Results for orders placed during the hospital encounter of 04/19/14  CBC WITH DIFFERENTIAL      Result Value Ref Range   WBC 11.0 (*) 4.0 - 10.5 K/uL   RBC 3.19 (*) 3.87 - 5.11 MIL/uL   Hemoglobin 10.9 (*) 12.0 - 15.0 g/dL   HCT 16.1 (*) 09.6 - 04.5 %   MCV 105.0 (*) 78.0 -  100.0 fL   MCH 34.2 (*) 26.0 - 34.0 pg   MCHC 32.5  30.0 - 36.0 g/dL   RDW 16.1  09.6 - 04.5 %   Platelets 315  150 - 400 K/uL   Neutrophils Relative % 54  43 - 77 %   Neutro Abs 5.8  1.7 - 7.7 K/uL   Lymphocytes Relative 36  12 - 46 %   Lymphs Abs 4.0  0.7 - 4.0 K/uL   Monocytes Relative 9  3 - 12 %   Monocytes Absolute 1.0  0.1 - 1.0 K/uL   Eosinophils Relative 1  0 - 5 %   Eosinophils Absolute 0.1  0.0 - 0.7 K/uL   Basophils Relative 0  0 - 1 %   Basophils Absolute 0.0  0.0 - 0.1 K/uL  COMPREHENSIVE METABOLIC PANEL      Result Value Ref Range   Sodium 141  137 - 147 mEq/L   Potassium 5.3  3.7 - 5.3 mEq/L   Chloride 94 (*) 96 - 112 mEq/L   CO2 21  19 - 32 mEq/L   Glucose, Bld 94  70 - 99 mg/dL   BUN 73 (*) 6 - 23 mg/dL   Creatinine, Ser 4.09 (*) 0.50 - 1.10 mg/dL   Calcium 9.0  8.4 - 81.1 mg/dL   Total Protein 7.9  6.0 - 8.3 g/dL   Albumin 3.6  3.5 - 5.2 g/dL   AST 23  0 - 37 U/L   ALT 13  0 - 35 U/L   Alkaline Phosphatase 282 (*) 39 - 117 U/L   Total Bilirubin 0.2 (*) 0.3 - 1.2 mg/dL   GFR calc non Af Amer 4 (*) >90 mL/min   GFR calc Af Amer 4 (*) >90 mL/min   Anion gap 26 (*) 5 - 15  PRO B NATRIURETIC PEPTIDE      Result Value Ref Range   Pro B Natriuretic peptide (BNP) 32641.0 (*) 0 - 125 pg/mL  TROPONIN I      Result Value Ref Range   Troponin I <0.30  <0.30 ng/mL  I-STAT TROPOININ, ED      Result Value Ref Range   Troponin i, poc 0.02  0.00 - 0.08 ng/mL   Comment 3             Labs Review Labs Reviewed  CBC WITH DIFFERENTIAL - Abnormal; Notable for the following:    WBC 11.0 (*)    RBC 3.19 (*)    Hemoglobin 10.9 (*)    HCT  33.5 (*)    MCV 105.0 (*)    MCH 34.2 (*)    All other components within normal limits  COMPREHENSIVE METABOLIC PANEL - Abnormal; Notable for the following:    Chloride 94 (*)    BUN 73 (*)    Creatinine, Ser 9.86 (*)    Alkaline Phosphatase 282 (*)    Total Bilirubin 0.2 (*)    GFR calc non Af Amer 4 (*)    GFR calc Af Amer 4 (*)    Anion gap 26 (*)    All other components within normal limits  PRO B NATRIURETIC PEPTIDE - Abnormal; Notable for the following:    Pro B Natriuretic peptide (BNP) 32641.0 (*)    All other components within normal limits  TROPONIN I  I-STAT TROPOININ, ED    Imaging Review Dg Chest Portable 1 View  04/19/2014   CLINICAL DATA:  Chest pain, shortness of breath.  EXAM: PORTABLE  CHEST - 1 VIEW  COMPARISON:  Chest radiograph April 15, 2014  FINDINGS: Cardiac silhouette is unremarkable and unchanged. Mildly calcified aortic knob. No pleural effusions. No focal consolidation. Similar left midlung zone scarring. No pneumothorax.  Soft tissue planes and included osseous structures are nonsuspicious. Multiple EKG lines overlie the patient and may obscure subtle underlying pathology.  IMPRESSION: No acute cardiopulmonary process.   Electronically Signed   By: Awilda Metro   On: 04/19/2014 04:14   Dg Shoulder Left  04/19/2014   CLINICAL DATA:  Painful left shoulder  EXAM: LEFT SHOULDER - 2+ VIEW  COMPARISON:  None.  FINDINGS: Glenohumeral joint is intact. No evidence of scapular fracture or humeral fracture. The acromioclavicular joint is intact.  IMPRESSION: No acute osseous abnormality.   Electronically Signed   By: Genevive Bi M.D.   On: 04/19/2014 07:46     EKG Interpretation   Date/Time:  Sunday April 19 2014 03:49:17 EDT Ventricular Rate:  100 PR Interval:  172 QRS Duration: 108 QT Interval:  379 QTC Calculation: 489 R Axis:   55 Text Interpretation:  Sinus tachycardia Borderline prolonged QT interval  artifact noted which limits evaluation No  significant change since last  tracing Confirmed by Bebe Shaggy  MD, DONALD ([id number]) on 04/19/2014 6:03:00 AM      MDM   Final diagnoses:  Shortness of breath  ESRD on dialysis  Medically noncompliant    Medications  ondansetron (ZOFRAN-ODT) disintegrating tablet 4 mg (4 mg Oral Given 04/19/14 0557)  sodium polystyrene (KAYEXALATE) 15 GM/60ML suspension 30 g (30 g Oral Given 04/19/14 1030)   Filed Vitals:   04/19/14 1000 04/19/14 1130 04/19/14 1215 04/19/14 1247  BP: 165/84 170/96 162/93 162/78  Pulse: 97 97 96 68  Temp:    97.4 F (36.3 C)  TempSrc:    Oral  Resp:  21 20 16   SpO2: 99% 90% 91% 92%   This provider reviewed patient's chart. Patient was last seen and assessed in ED setting on 04/15/2014 regarding chest tightness and shortness of breath. At that point in time patient missed dialysis with hyperkalemia. Patient was admitted to the hospital or dialysis was performed. Patient presents to the ED for the same. Patient reported that she has not had dialysis yesterday, Saturday-reported that her next session is on Tuesday and is concerned that she make it fluid overloaded. Reported that she's issues with transportation resulting in continuously missing dialysis sessions. Upon last admission, 04/17/2014 patient was offered assisted living and transportation for dialysis-patient refused and signed out AMA. Patient has Medicare and is able to call 2 days prior to session for transportation to be performed. EKG noted sinus tachycardia with a heart rate of 100 beats per minute-prolonged QT intervals-no change since last tracing. Troponin negative elevation. Second troponin negative [place] [address].0. CBC noted mildly elevated white blood cell count of 11.0 with no left shift or leukocytosis. Hgb 10.9 - patient normally ranges around 10-9. BMP noted-negative elevated potassium-potassium 5.3. Low chloride of 94. Elevated BUN of 73, creatinine of 9.86. Elevated alkaline phosphatase  of 282. Anion gap of 26.0 mEq per liter. Chest x-ray negative for acute cardiopulmonary disease-no pleural effusions or focal consolidation identified. Pain film of left shoulder negative for acute osseous injury. This provider spoke with Dr. Allena Katz - nephrology - discussed case and labs and vitals in great detail. Recommended patient to be discharged, reported that patient does not need dialysis at this time. Reported that patient can be discharged home and  to give Kayexalate. Reported that he will get in contact with the patient's dialysis center and set up an appointment tomorrow for patient to get dialysis.  Social worker spoke with patient. Resource guide given. Patient continuously one thing and assisted living like to be discharged to an assisted living today. Social worker spoke with patient that it is a process. Patient reports that she does not want to go back to her niece's home-would like to stay in the hospital so that she can be set up with an assisted living. Patient seen and assessed by attending physician, Dr. Fredderick Phenix who cleared patient for discharge. Recommended and agreed to Kayexalate. Recommended patient to be discharged home. Patient does not appear to need dialysis at this time. Potassium 5.7. Patient is in no sign of respiratory distress. Patient stable, afebrile. Patient not toxic appearing. Discharged patient. Discussed with patient to rest. Discussed with patient plan for dialysis tomorrow - discussed importance of going to these sessions - discussed with patient that she is to call [facility] tomorrow morning to identify when her session is. Educated patient on importance of being medically complaint and that if she does not go to her dialysis sessions this can lead to death. Discussed with patient to avoid any physical or strenuous activity. Discussed with patient to closely monitor symptoms and if symptoms are to worsen or change to report back to the ED - strict return  instructions given.  Patient agreed to plan of care, understood, all questions answered.   Raymon Mutton, PA-C 04/19/14 1718

## 2014-04-19 NOTE — ED Notes (Signed)
Per EMS, pt was coming from home form SOB and CP which is worse on palpation. Pt reports that she is new to her home and has to sleep on a couch and had increased SOB while laying flat. Pt is a dialysis pt who goes ever tues, thurs, and Sat. Pt miss her last Thursday appointment due to lack of transportation. NAD at this time.

## 2014-04-19 NOTE — Progress Notes (Signed)
Weekend CSW met with patient to provide a list of local ALF's and educated patient on going to PCP to get FL2 and paperwork completed. Patient currently staying with her niece and states that she wants to leave but has no alternative living arrangements. CSW encouraged patient to make alternative arrangements before leaving her current living situation. Patient reports that she wants to live at an ALF. CSW also provided patient with information on medical transportation to her PCP and dialysis appointments in [place]. Patient thanked CSW, has no further questions.   Tilden Fossa, MSW, [place] Clinical Social Worker [facility] Emergency Dept. [phone]

## 2014-04-20 NOTE — ED Provider Notes (Signed)
Medical screening examination/treatment/procedure(s) were conducted as a shared visit with non-physician practitioner(s) and myself.  I personally evaluated the patient during the encounter.   EKG Interpretation   Date/Time:  Sunday April 19 2014 03:49:17 EDT Ventricular Rate:  100 PR Interval:  172 QRS Duration: 108 QT Interval:  379 QTC Calculation: 489 R Axis:   55 Text Interpretation:  Sinus tachycardia Borderline prolonged QT interval  artifact noted which limits evaluation No significant change since last  tracing Confirmed by Bebe ShaggyWICKLINE  MD, DONALD ([id number]) on 04/19/2014 6:03:00 AM      Pt ESRD, missed last dialysis.  K+ok, no pulmonary edema, nephrology to arrange for dialysis tomorrow  Jean BuccoMelanie Tytionna Cloyd, MD 04/20/14 2020

## 2014-04-21 ENCOUNTER — Emergency Department (HOSPITAL_COMMUNITY): Payer: Medicare Other

## 2014-04-21 ENCOUNTER — Encounter (HOSPITAL_COMMUNITY): Payer: Self-pay | Admitting: Emergency Medicine

## 2014-04-21 ENCOUNTER — Inpatient Hospital Stay (HOSPITAL_COMMUNITY)
Admission: EM | Admit: 2014-04-21 | Discharge: 2014-04-22 | DRG: 189 | Disposition: A | Discharge status: Home / Self Care | Payer: Medicare Other | Attending: Nephrology | Admitting: Nephrology

## 2014-04-21 DIAGNOSIS — I509 Heart failure, unspecified: Secondary | ICD-10-CM | POA: Diagnosis present

## 2014-04-21 DIAGNOSIS — F3289 Other specified depressive episodes: Secondary | ICD-10-CM | POA: Diagnosis present

## 2014-04-21 DIAGNOSIS — Z5900 Homelessness unspecified: Secondary | ICD-10-CM

## 2014-04-21 DIAGNOSIS — Z7982 Long term (current) use of aspirin: Secondary | ICD-10-CM | POA: Diagnosis not present

## 2014-04-21 DIAGNOSIS — N039 Chronic nephritic syndrome with unspecified morphologic changes: Secondary | ICD-10-CM | POA: Diagnosis present

## 2014-04-21 DIAGNOSIS — R0602 Shortness of breath: Secondary | ICD-10-CM | POA: Diagnosis present

## 2014-04-21 DIAGNOSIS — Z21 Asymptomatic human immunodeficiency virus [HIV] infection status: Secondary | ICD-10-CM | POA: Diagnosis present

## 2014-04-21 DIAGNOSIS — R5381 Other malaise: Secondary | ICD-10-CM | POA: Diagnosis present

## 2014-04-21 DIAGNOSIS — Z79899 Other long term (current) drug therapy: Secondary | ICD-10-CM

## 2014-04-21 DIAGNOSIS — F329 Major depressive disorder, single episode, unspecified: Secondary | ICD-10-CM | POA: Diagnosis present

## 2014-04-21 DIAGNOSIS — M25512 Pain in left shoulder: Secondary | ICD-10-CM

## 2014-04-21 DIAGNOSIS — E669 Obesity, unspecified: Secondary | ICD-10-CM | POA: Diagnosis present

## 2014-04-21 DIAGNOSIS — N186 End stage renal disease: Secondary | ICD-10-CM | POA: Diagnosis present

## 2014-04-21 DIAGNOSIS — E8770 Fluid overload, unspecified: Secondary | ICD-10-CM

## 2014-04-21 DIAGNOSIS — J811 Chronic pulmonary edema: Secondary | ICD-10-CM | POA: Diagnosis present

## 2014-04-21 DIAGNOSIS — N2581 Secondary hyperparathyroidism of renal origin: Secondary | ICD-10-CM | POA: Diagnosis present

## 2014-04-21 DIAGNOSIS — I120 Hypertensive chronic kidney disease with stage 5 chronic kidney disease or end stage renal disease: Secondary | ICD-10-CM | POA: Diagnosis present

## 2014-04-21 DIAGNOSIS — F17200 Nicotine dependence, unspecified, uncomplicated: Secondary | ICD-10-CM | POA: Diagnosis present

## 2014-04-21 DIAGNOSIS — F411 Generalized anxiety disorder: Secondary | ICD-10-CM | POA: Diagnosis present

## 2014-04-21 DIAGNOSIS — M129 Arthropathy, unspecified: Secondary | ICD-10-CM | POA: Diagnosis present

## 2014-04-21 DIAGNOSIS — Z992 Dependence on renal dialysis: Secondary | ICD-10-CM | POA: Diagnosis not present

## 2014-04-21 DIAGNOSIS — D631 Anemia in chronic kidney disease: Secondary | ICD-10-CM | POA: Diagnosis present

## 2014-04-21 DIAGNOSIS — R0600 Dyspnea, unspecified: Secondary | ICD-10-CM

## 2014-04-21 LAB — COMPREHENSIVE METABOLIC PANEL
ALT: 10 U/L (ref 0–35)
AST: 14 U/L (ref 0–37)
Albumin: 3.5 g/dL (ref 3.5–5.2)
Alkaline Phosphatase: 271 U/L — ABNORMAL HIGH (ref 39–117)
Anion gap: 30 — ABNORMAL HIGH (ref 5–15)
BUN: 91 mg/dL — ABNORMAL HIGH (ref 6–23)
CALCIUM: 8.3 mg/dL — AB (ref 8.4–10.5)
CO2: 17 meq/L — AB (ref 19–32)
Chloride: 91 mEq/L — ABNORMAL LOW (ref 96–112)
Creatinine, Ser: 13.19 mg/dL — ABNORMAL HIGH (ref 0.50–1.10)
GFR calc Af Amer: 3 mL/min — ABNORMAL LOW (ref >90)
GFR calc non Af Amer: 3 mL/min — ABNORMAL LOW (ref >90)
Glucose, Bld: 155 mg/dL — ABNORMAL HIGH (ref 70–99)
Potassium: 4.9 mEq/L (ref 3.7–5.3)
SODIUM: 138 meq/L (ref 137–147)
TOTAL PROTEIN: 7.7 g/dL (ref 6.0–8.3)
Total Bilirubin: 0.3 mg/dL (ref 0.3–1.2)

## 2014-04-21 LAB — CBC WITH DIFFERENTIAL/PLATELET
Basophils Absolute: 0 10*3/uL (ref 0.0–0.1)
Basophils Relative: 0 % (ref 0–1)
EOS ABS: 0.1 10*3/uL (ref 0.0–0.7)
EOS PCT: 1 % (ref 0–5)
HCT: 31.6 % — ABNORMAL LOW (ref 36.0–46.0)
Hemoglobin: 10.2 g/dL — ABNORMAL LOW (ref 12.0–15.0)
LYMPHS PCT: 25 % (ref 12–46)
Lymphs Abs: 2.5 10*3/uL (ref 0.7–4.0)
MCH: 33.3 pg (ref 26.0–34.0)
MCHC: 32.3 g/dL (ref 30.0–36.0)
MCV: 103.3 fL — AB (ref 78.0–100.0)
Monocytes Absolute: 1 10*3/uL (ref 0.1–1.0)
Monocytes Relative: 10 % (ref 3–12)
Neutro Abs: 6.4 10*3/uL (ref 1.7–7.7)
Neutrophils Relative %: 64 % (ref 43–77)
PLATELETS: 291 10*3/uL (ref 150–400)
RBC: 3.06 MIL/uL — ABNORMAL LOW (ref 3.87–5.11)
RDW: 13.9 % (ref 11.5–15.5)
WBC: 10 10*3/uL (ref 4.0–10.5)

## 2014-04-21 LAB — TROPONIN I: Troponin I: <0.3 ng/mL (ref <0.30)

## 2014-04-21 MED ORDER — SODIUM CHLORIDE 0.9 % IV SOLN: 100.0000 mL | INTRAVENOUS | Status: DC | PRN

## 2014-04-21 MED ORDER — POLYETHYLENE GLYCOL 3350 17 G PO PACK
17.0000 g | PACK | Freq: Every day | ORAL | Status: DC | PRN
  Filled 2014-04-21: qty 1

## 2014-04-21 MED ORDER — SEVELAMER CARBONATE 800 MG PO TABS
1600.0000 mg | ORAL_TABLET | ORAL | Status: DC | PRN
  Filled 2014-04-21: qty 2

## 2014-04-21 MED ORDER — ACETAMINOPHEN 325 MG PO TABS
650.0000 mg | ORAL_TABLET | Freq: Four times a day (QID) | ORAL | Status: DC | PRN
  Administered 2014-04-21: 650 mg via ORAL

## 2014-04-21 MED ORDER — LIDOCAINE-PRILOCAINE 2.5-2.5 % EX CREA
1.0000 "application " | TOPICAL_CREAM | CUTANEOUS | Status: DC | PRN
  Filled 2014-04-21: qty 5

## 2014-04-21 MED ORDER — PENTAFLUOROPROP-TETRAFLUOROETH EX AERO: 1.0000 "application " | INHALATION_SPRAY | CUTANEOUS | Status: DC | PRN

## 2014-04-21 MED ORDER — ALPRAZOLAM 0.25 MG PO TABS
0.2500 mg | ORAL_TABLET | Freq: Once | ORAL | Status: AC
  Administered 2014-04-21: 0.25 mg via ORAL
  Filled 2014-04-21: qty 1

## 2014-04-21 MED ORDER — HYDRALAZINE HCL 25 MG PO TABS
25.0000 mg | ORAL_TABLET | Freq: Three times a day (TID) | ORAL | Status: DC
  Administered 2014-04-21 – 2014-04-22 (×4): 25 mg via ORAL
  Filled 2014-04-21 (×7): qty 1

## 2014-04-21 MED ORDER — FLUOXETINE HCL 20 MG PO CAPS
20.0000 mg | ORAL_CAPSULE | Freq: Every day | ORAL | Status: DC
  Administered 2014-04-21 – 2014-04-22 (×2): 20 mg via ORAL
  Filled 2014-04-21 (×2): qty 1

## 2014-04-21 MED ORDER — AMLODIPINE BESYLATE 10 MG PO TABS
10.0000 mg | ORAL_TABLET | Freq: Every day | ORAL | Status: DC
  Administered 2014-04-21: 10 mg via ORAL
  Filled 2014-04-21 (×3): qty 1

## 2014-04-21 MED ORDER — PANTOPRAZOLE SODIUM 40 MG PO TBEC
40.0000 mg | DELAYED_RELEASE_TABLET | Freq: Every day | ORAL | Status: DC
  Administered 2014-04-21 – 2014-04-22 (×2): 40 mg via ORAL
  Filled 2014-04-21 (×2): qty 1

## 2014-04-21 MED ORDER — ASPIRIN EC 81 MG PO TBEC
81.0000 mg | DELAYED_RELEASE_TABLET | Freq: Every day | ORAL | Status: DC
  Administered 2014-04-21 – 2014-04-22 (×2): 81 mg via ORAL
  Filled 2014-04-21 (×2): qty 1

## 2014-04-21 MED ORDER — HEPARIN SODIUM (PORCINE) 1000 UNIT/ML DIALYSIS: 7000.0000 [IU] | Freq: Once | INTRAMUSCULAR | Status: DC

## 2014-04-21 MED ORDER — LIDOCAINE HCL (PF) 1 % IJ SOLN: 5.0000 mL | INTRAMUSCULAR | Status: DC | PRN

## 2014-04-21 MED ORDER — SEVELAMER CARBONATE 800 MG PO TABS
2400.0000 mg | ORAL_TABLET | Freq: Three times a day (TID) | ORAL | Status: DC
  Administered 2014-04-21 – 2014-04-22 (×3): 2400 mg via ORAL
  Filled 2014-04-21 (×5): qty 3

## 2014-04-21 MED ORDER — NEPRO/CARBSTEADY PO LIQD
237.0000 mL | ORAL | Status: DC | PRN
  Filled 2014-04-21: qty 237

## 2014-04-21 MED ORDER — GUAIFENESIN-DM 100-10 MG/5ML PO SYRP
5.0000 mL | ORAL_SOLUTION | ORAL | Status: DC | PRN
  Administered 2014-04-22: 5 mL via ORAL
  Filled 2014-04-21 (×2): qty 5

## 2014-04-21 MED ORDER — LAMIVUDINE 10 MG/ML PO SOLN
50.0000 mg | Freq: Every day | ORAL | Status: DC
  Administered 2014-04-22: 50 mg via ORAL
  Filled 2014-04-21 (×2): qty 5

## 2014-04-21 MED ORDER — CARVEDILOL 6.25 MG PO TABS
6.2500 mg | ORAL_TABLET | Freq: Two times a day (BID) | ORAL | Status: DC
  Administered 2014-04-21 – 2014-04-22 (×2): 6.25 mg via ORAL
  Filled 2014-04-21 (×4): qty 1

## 2014-04-21 MED ORDER — HYDROCODONE-ACETAMINOPHEN 5-325 MG PO TABS
1.0000 | ORAL_TABLET | Freq: Four times a day (QID) | ORAL | Status: DC | PRN
  Administered 2014-04-21 – 2014-04-22 (×3): 1 via ORAL
  Filled 2014-04-21 (×4): qty 1

## 2014-04-21 MED ORDER — ALTEPLASE 2 MG IJ SOLR
2.0000 mg | Freq: Once | INTRAMUSCULAR | Status: DC | PRN
  Filled 2014-04-21: qty 2

## 2014-04-21 MED ORDER — ACETAMINOPHEN 325 MG PO TABS
ORAL_TABLET | ORAL | Status: AC
  Filled 2014-04-21: qty 1

## 2014-04-21 MED ORDER — RALTEGRAVIR POTASSIUM 400 MG PO TABS
400.0000 mg | ORAL_TABLET | Freq: Two times a day (BID) | ORAL | Status: DC
  Administered 2014-04-21 – 2014-04-22 (×2): 400 mg via ORAL
  Filled 2014-04-21 (×4): qty 1

## 2014-04-21 MED ORDER — HEPARIN SODIUM (PORCINE) 1000 UNIT/ML DIALYSIS: 1000.0000 [IU] | INTRAMUSCULAR | Status: DC | PRN

## 2014-04-21 MED ORDER — ALBUTEROL SULFATE (2.5 MG/3ML) 0.083% IN NEBU
2.5000 mg | INHALATION_SOLUTION | RESPIRATORY_TRACT | Status: DC | PRN
  Administered 2014-04-21: 2.5 mg via RESPIRATORY_TRACT

## 2014-04-21 MED ORDER — RENA-VITE PO TABS
1.0000 | ORAL_TABLET | Freq: Every day | ORAL | Status: DC
  Administered 2014-04-22: 1 via ORAL
  Filled 2014-04-21: qty 1

## 2014-04-21 MED ORDER — SODIUM CHLORIDE 0.9 % IJ SOLN: 3.0000 mL | Freq: Two times a day (BID) | INTRAMUSCULAR | Status: DC

## 2014-04-21 MED ORDER — KETOROLAC TROMETHAMINE 30 MG/ML IJ SOLN: 30.0000 mg | Freq: Once | INTRAMUSCULAR | Status: DC

## 2014-04-21 MED ORDER — TENOFOVIR DISOPROXIL FUMARATE 300 MG PO TABS: 300.0000 mg | ORAL_TABLET | ORAL | Status: DC

## 2014-04-21 MED ORDER — ACETAMINOPHEN 650 MG RE SUPP: 650.0000 mg | Freq: Four times a day (QID) | RECTAL | Status: DC | PRN

## 2014-04-21 MED ORDER — ALBUTEROL SULFATE (2.5 MG/3ML) 0.083% IN NEBU
INHALATION_SOLUTION | RESPIRATORY_TRACT | Status: AC
  Filled 2014-04-21: qty 3

## 2014-04-21 MED ORDER — SENNOSIDES-DOCUSATE SODIUM 8.6-50 MG PO TABS
2.0000 | ORAL_TABLET | Freq: Every day | ORAL | Status: DC
  Administered 2014-04-22: 2 via ORAL
  Filled 2014-04-21: qty 2

## 2014-04-21 NOTE — Procedures (Signed)
I was present at this dialysis session, have reviewed the session itself and made  appropriate changes  Rob Tarez Bowns MD (pgr) [phone]    (c) [phone] 04/21/2014, 12:26 PM   

## 2014-04-21 NOTE — ED Notes (Signed)
Case management at bedside.

## 2014-04-21 NOTE — Care Management Note (Signed)
CM noted consult for hospital bed for this pt. MD please order bed and complete questions required to qualify this pt for insurance to cover this bed. Thank you, Johny Shockheryl Jamyra Zweig RN MPH, case manager, [phone]

## 2014-04-21 NOTE — Progress Notes (Addendum)
CARE MANAGEMENT ED NOTE 04/21/2014  Patient:  [name],[name]   Account Number:  [id number]  Date Initiated:  04/21/2014  Documentation initiated by:  Ferdinand CavaSCHETTINO,Nila Winker  Subjective/Objective Assessment:   63 yo female presenting to the ED with c/o SOB     Subjective/Objective Assessment Detail:     Action/Plan:   [facility] transportation and [facility] will contact patient to establish care once medical information received from [facility]   Action/Plan Detail:   Anticipated DC Date:       Status Recommendation to Physician:   Result of Recommendation:  Agreed    DC Planning Services  CM consult  Other    Choice offered to / List presented to:  C-1 Patient          Status of service:  Completed, signed off  ED Comments:   ED Comments Detail:  This CM spoke with the patient regarding the ED visit. The patient stated that she missed her dialysis treatment on Saturday and didn't feel like she could make it to her dialysis appointment today so she came into the ED. The patient stated that she has transportation issues and is unable to keep appointments due to this. This CM spoke with the patient regarding dialysis and HIV treatment. The patient stated that she has been a [facility] but she hasn't gone in a while because transportation is difficult. This CM called the [facility] and spoke with Ashley Royaltyene RN and she stated that the patient hasn't been in the Center recently because she states she has transportations issues. Bradly ChrisRene stated that when the patient has been for treatment in the past she does not stay for the full 4 hours and states she needs to leave after 3 hours of treatment. Bradly ChrisRene stated that the Center SW was working on establishing transportation for the patient but she is unsure of the status and the SW is not in the Center today. This CM stated that Medicaid transportation would be called to either establish service or set up a  transportation schedule for dialysis treatments and would call [facility] back to report the status of transportation. Bradly ChrisRene verbalized understanding. This CM then called the [facility] to request a transfer of the patients [facility] to the [facility] and this CM was faxed a release of medical information which the patient signed and then was faxed back to the [facility]. The [facility] secretary, Karel JarvisEbony, stated that once the medical files were received form [facility] they would then call the patient to schedule an appointment. This CM then called the [facility] and confirmed that the patient was a client there and last visit was on 10/21/13 with Dr. Carolin GuernseyEckles. This CM informed the secretary that the patient has [place] [facility] and is requesting to have her care transferred [facility]. Secretary for [facility] verbalized understanding. This CM will contact Medicaid transportation for status and collaboration of transportation services.   This CM received confirmation that Medicaid transportation is in place for patient dialysis appointments every Tues, Thurs, and Sat from 12-4. Medicaid transportation will pick up between 10:55-11:35 to take to the appointment and pick up from dialysis will be at 4:15. . This CM then called the dialysis unit and spoke with Darel HongJudy and requested that she inform the patient of Medicaid transportation schedule that has been arranged. Darel HongJudy stated that she will write it down for the patient and review it  with her. This CM then called [facility] and spoke with RN Alvino Chapel and updated her on the transportation that has been put in place for dialysis appointments. No further CM needs identified at this time.

## 2014-04-21 NOTE — ED Notes (Signed)
Pt. reports progressing SOB with chest tightness and productive cough for several days , pt. missed her hemodialysis this Saturday , denies fever or chills.

## 2014-04-21 NOTE — ED Notes (Signed)
Attempted report 

## 2014-04-21 NOTE — ED Notes (Signed)
Pt refused IV from ED RNs or from IV team, MD aware.

## 2014-04-21 NOTE — ED Provider Notes (Signed)
Medical screening examination/treatment/procedure(s) were conducted as a shared visit with non-physician practitioner(s) and myself.  I personally evaluated the patient during the encounter. Patient is a 63 year old female with history of hypertension, HIV, end-stage renal disease on dialysis. She presents for evaluation of weakness, difficulty breathing, chest tightness for the past several days. Her last dialysis session was 5 days ago. She states that she has been able to arrange transportation to get to dialysis.  On exam, vitals are stable the patient is afebrile. When I am in the room with her, her oxygen saturations are 90-91%. Head is atraumatic, normocephalic. Neck is supple. Heart is regular rate and rhythm. Lungs are noted to have rales in the bases. Extremities with trace edema.  Workup reveals findings consistent with volume overload on the chest x-ray. Laboratory studies reveal a CO2 of 17 and potassium within normal range. Due to the volume overload and acidosis, I've spoken with Dr. Arlean HoppingSchertz who will admit the patient for hemodialysis.   Date: 04/21/2014  Rate: 102  Rhythm: sinus tachycardia  QRS Axis: normal  Intervals: normal  ST/T Wave abnormalities: normal  Conduction Disutrbances:none  Narrative Interpretation:   Old EKG Reviewed: unchanged     Geoffery Lyonsouglas Helaina Stefano, MD 04/21/14 [phone]

## 2014-04-21 NOTE — H&P (Signed)
Renal Service History & Physical [facility]  [name] is an 63 y.o. female.  Chief Complaint: Dyspnea HPI: 63 yo AAF on HD since 2009, also HIV, obesity, HTN, anxiety, homelessness and hx of polysubstance abuse, presented to ED overnight with SOB and cough. No fevers, no sputum production. Some L shoulder pain.  Troponins were negative.  CXR showed +pulm edema.  Pt has been in the [place] and has missed HD in [place] as she says she cannot get there , has no transportation. She says that she is now enrolled at a HD unit in [place] (? [facility]) and was supposed to start there today but she got SOB last night and "couldn't make it".  She denies and n/v/d, abd pain, jt pain, HA , confusion.  She is living w her nieces now.  She has been previously on dialysis at several other HD units in the region.     Past Medical History  Past Medical History  Diagnosis Date  . Hypertension   . HIV infection   . Morbid obesity   . ESRD (end stage renal disease) on dialysis 09/30/2013    [place] in [place], [place] around [place].  ESRD due to HTN vs drug abuse according to pt.  [facility] until Feb 2015 when she was admitted to a SNF due to homelessness and drug abuse.  Then [facility].     . Anginal pain   . CHF (congestive heart failure)   . Shortness of breath   . Pneumonia   . Depression   . Anxiety   . GERD (gastroesophageal reflux disease)   . Arthritis   . Anemia    Past Surgical History  Past Surgical History  Procedure Laterality Date  . Arteriovenous graft placement      left forearm  . Cardiac catheterization    . Laparotomy      states seh was cut open because seh was bleeding on the inside   Family History  Family History  Problem Relation Age of Onset  . Kidney failure Other     niece  . High blood pressure    . Lung cancer    . Breast cancer Neg Hx   . Colon cancer Neg  Hx   . Stroke Mother   . HIV/AIDS Brother     died of AIDS   Social History  reports that she has been smoking Cigarettes.  She has been smoking about 0.50 packs per day. She has never used smokeless tobacco. She reports that she uses illicit drugs (Marijuana and "Crack" cocaine) about once per week. She reports that she does not drink alcohol. Allergies  Allergies  Allergen Reactions  . Tramadol     "Gives me bad dreams"  . Morphine And Related Hives and Rash   Home medications Prior to Admission medications   Medication Sig Start Date End Date Taking? Authorizing Provider  amitriptyline (ELAVIL) 50 MG tablet Take 50 mg by mouth at bedtime.   Yes Historical Provider, MD  amLODipine (NORVASC) 10 MG tablet Take 1 tablet (10 mg total) by mouth at bedtime. 04/17/14  Yes Kathlen ModyVijaya Akula, MD  aspirin EC 81 MG tablet Take 81 mg by mouth daily.   Yes Historical Provider, MD  Aspirin-Salicylamide-Caffeine (BC HEADACHE PO) Take 2 packets by mouth 2 (two) times daily as needed. pain   Yes Historical Provider, MD  carvedilol (COREG) 6.25 MG tablet Take 1 tablet (6.25 mg total) by mouth 2 (two) times daily with a meal. 04/17/14  Yes Kathlen Mody, MD  diclofenac sodium (VOLTAREN) 1 % GEL Apply 4 g topically 4 (four) times daily as needed (pain).    Yes Historical Provider, MD  diphenhydrAMINE (BENADRYL) 50 MG capsule Take 50 mg by mouth at bedtime as needed for sleep.   Yes Historical Provider, MD  FLUoxetine (PROZAC) 20 MG capsule Take 20 mg by mouth daily.   Yes Historical Provider, MD  guaiFENesin-dextromethorphan (ROBITUSSIN DM) 100-10 MG/5ML syrup Take 5 mLs by mouth every 4 (four) hours as needed for cough. 04/17/14  Yes Kathlen Mody, MD  hydrALAZINE (APRESOLINE) 25 MG tablet Take 1 tablet (25 mg total) by mouth every 8 (eight) hours. 04/17/14  Yes Kathlen Mody, MD  HYDROcodone-acetaminophen (NORCO/VICODIN) 5-325 MG per tablet Take 1 tablet by mouth every 6 (six) hours as needed for moderate pain. 04/14/14   Yes Jacquelin Hawking, MD  hydrOXYzine (ATARAX/VISTARIL) 25 MG tablet Take 25 mg by mouth 3 (three) times daily.   Yes Historical Provider, MD  lamiVUDine (EPIVIR) 10 MG/ML solution Take 50 mg by mouth daily.   Yes Historical Provider, MD  multivitamin (RENA-VIT) TABS tablet Take 1 tablet by mouth daily.   Yes Historical Provider, MD  omeprazole (PRILOSEC) 20 MG capsule Take 20 mg by mouth daily.   Yes Historical Provider, MD  polyethylene glycol (MIRALAX / GLYCOLAX) packet Take 17 g by mouth daily as needed for mild constipation.    Yes Historical Provider, MD  Protein POWD Take 1 scoop by mouth 3 (three) times daily. Mixed with applesauce   Yes Historical Provider, MD  raltegravir (ISENTRESS) 400 MG tablet Take 400 mg by mouth 2 (two) times daily.    Yes Historical Provider, MD  senna-docusate (SENOKOT-S) 8.6-50 MG per tablet Take 2 tablets by mouth daily.   Yes Historical Provider, MD  sevelamer carbonate (RENVELA) 800 MG tablet Take 1,600-2,400 mg by mouth 3 (three) times daily with meals. *takes 2400mg  with meals and 1600mg  with snacks*   Yes Historical Provider, MD  tenofovir (VIREAD) 300 MG tablet Take 300 mg by mouth every Friday. Takes on Fridays.   Yes Historical Provider, MD  ipratropium-albuterol (DUONEB) 0.5-2.5 (3) MG/3ML SOLN Take 3 mLs by nebulization every 6 (six) hours as needed. 04/17/14   Kathlen Mody, MD  predniSONE (DELTASONE) 20 MG tablet Prednisone 20 mg daily for 3 days and stop. 04/17/14   Kathlen Mody, MD   Liver Function Tests  Recent Labs Lab 04/16/14 0356 04/19/14 0359 04/21/14 0344  AST 12 23 14   ALT 5 13 10   ALKPHOS 271* 282* 271*  BILITOT 0.2* 0.2* 0.3  PROT 7.3 7.9 7.7  ALBUMIN 3.3* 3.6 3.5   No results found for this basename: LIPASE, AMYLASE,  in the last 168 hours CBC  Recent Labs Lab 04/15/14 0435 04/16/14 0356 04/19/14 0359 04/21/14 0344  WBC 7.6 7.1 11.0* 10.0  NEUTROABS 4.9  --  5.8 6.4  HGB 10.1* 9.9* 10.9* 10.2*  HCT 31.5* 31.4* 33.5*  31.6*  MCV 106.1* 107.2* 105.0* 103.3*  PLT 222 223 315 291   Basic Metabolic Panel  Recent Labs Lab 04/14/14 1549 04/15/14 0435 04/16/14 0356 04/19/14 0359 04/21/14 0344  NA 138 137 137 141 138  K 6.4* 5.7* 4.9 5.3 4.9  CL 95* 95* 95* 94* 91*  CO2 22 20 24 21  17*  GLUCOSE 128* 125* 104* 94 155*  BUN 73*  82* 37* 73* 91*  CREATININE 12.03* 12.57* 7.22* 9.86* 13.19*  CALCIUM 8.4 8.6 8.8 9.0 8.3*    Filed Vitals:   04/21/14 0700 04/21/14 0700 04/21/14 0730 04/21/14 0738  BP: 162/94 175/92 156/90 156/90  Pulse: 98 87 95 96  Temp:      TempSrc:      Resp:  14  24  SpO2: 91% 97% 93% 93%   Exam: Obese, pleasant AAF, raspy cough, no distress No rash, cyanosis or gangrene Sclera anicteric, throat clear +JVD Chest bibasilar rales 1/3 up post RRR no MRG Abd obese, soft, NTND, no ascites 1+ bilat LE edema No joint effusion Neuro is nf, Ox3, alert  CXR moderate pulm edema  HD: [facility] (transferred to [place], was to start 04/21/14) 4h   400/800   110kg    2/2.25 Bath   LFA AVF (buttonhole)  Heparin 7000 Hectorol 7 ug TIW, Venofer 50 mg q Thurs   Assessment: 1 Dyspnea / pulm edema / vol excess- has missed HD while transferring from [place] to [place]. Now has a unit here in [place] and was supposed to start today but presented last night with pulm edema.  Is at dry weight, will need lower dry wt at dc.   2 ESRD on HD since 2009 3 Hx homelessness and drug abuse- living w family now 4 HIV on ART 5 Anemia no epo 6 HPTH on vit D and binders 7 HTN on 3 BP meds   Plan- Admit, cont home meds, HD today.  Depending on response may be able to d/c later today and go to OP dialysis on Thursday here in [place].     Vinson Moselle MD (pgr) [phone]    ([phone] 04/21/2014, 8:01 AM

## 2014-04-21 NOTE — ED Notes (Signed)
Patient transported to X-ray 

## 2014-04-21 NOTE — Care Management Note (Signed)
CARE MANAGEMENT NOTE 04/21/2014  Patient:  [name],[name]   Account Number:  [id number]  Date Initiated:  04/21/2014  Documentation initiated by:  Johny ShockOYAL,Donyea Beverlin  Subjective/Objective Assessment:   Pt recently left hospital AMA, readm with request for hospital bed.     Action/Plan:   Noted that ED CM has setup pt to go to [facility] for followup and has reviewed transportation options with pt.  Request for hospital bed noted, MD will need to qualify this pt as this need is unclear to CM. t   Anticipated DC Date:  04/23/2014   Anticipated DC Plan:  HOME/SELF CARE         Choice offered to / List presented to:             Status of service:   Medicare Important Message given?   (If response is "NO", the following Medicare IM given date fields will be blank) Date Medicare IM given:   Medicare IM given by:   Date Additional Medicare IM given:   Additional Medicare IM given by:    Discharge Disposition:    Per UR Regulation:    If discussed at Long Length of Stay Meetings, dates discussed:    Comments:

## 2014-04-21 NOTE — ED Provider Notes (Signed)
CSN: [id number]     Arrival date & time 04/21/14  4098 History   First MD Initiated Contact with Patient 04/21/14 0349     Chief Complaint  Patient presents with  . Shortness of Breath     (Consider location/radiation/quality/duration/timing/severity/associated sxs/prior Treatment) HPI Comments: Patient is a 63 yo F PMHx significant for HTN, HIV, ESRD on dialysis T,TH,S, CHF, depression, anxiety, anemia, A. Fib,  presenting to the ED for a missed dialysis appointment on Saturday d/t transportation issues. Patient is requesting to speak to a Child psychotherapist regarding the manor. She states she has had chronic on and off chest tightness, cough, and shortness of breath. She states she has had these symptoms intermittently. Patient has long history of non-compliance with dialysis.   Patient is a 63 y.o. female presenting with shortness of breath.  Shortness of Breath Associated symptoms: cough   Associated symptoms: no fever     Past Medical History  Diagnosis Date  . Hypertension   . HIV infection   . Morbid obesity   . ESRD (end stage renal disease) on dialysis 09/30/2013    Started dialysis in [place], [place] around 2009.  ESRD due to HTN vs drug abuse according to pt.  Was on dialysis at [facility] until Feb 2015 when she was admitted to a SNF due to homelessness and drug abuse.  Then changed to [facility] on TTS schedule.     . Anginal pain   . CHF (congestive heart failure)   . Shortness of breath   . Pneumonia   . Depression   . Anxiety   . GERD (gastroesophageal reflux disease)   . Arthritis   . Anemia    Past Surgical History  Procedure Laterality Date  . Arteriovenous graft placement      left forearm  . Cardiac catheterization    . Laparotomy      states seh was cut open because seh was bleeding on the inside   Family History  Problem Relation Age of Onset  . Kidney failure Other     niece  . High blood pressure    . Lung cancer    . Breast cancer Neg Hx   . Colon  cancer Neg Hx   . Stroke Mother   . HIV/AIDS Brother     died of AIDS   History  Substance Use Topics  . Smoking status: Current Every Day Smoker -- 0.50 packs/day    Types: Cigarettes  . Smokeless tobacco: Never Used  . Alcohol Use: No   OB History   Grav Para Term Preterm Abortions TAB SAB Ect Mult Living                 Review of Systems  Constitutional: Negative for fever and chills.  Respiratory: Positive for cough, chest tightness and shortness of breath.   All other systems reviewed and are negative.     Allergies  Tramadol and Morphine and related  Home Medications   Prior to Admission medications   Medication Sig Start Date End Date Taking? Authorizing Provider  amitriptyline (ELAVIL) 50 MG tablet Take 50 mg by mouth at bedtime.   Yes Historical Provider, MD  amLODipine (NORVASC) 10 MG tablet Take 1 tablet (10 mg total) by mouth at bedtime. 04/17/14  Yes Kathlen Mody, MD  aspirin EC 81 MG tablet Take 81 mg by mouth daily.   Yes Historical Provider, MD  Aspirin-Salicylamide-Caffeine (BC HEADACHE PO) Take 2 packets by mouth 2 (two)  times daily as needed. pain   Yes Historical Provider, MD  carvedilol (COREG) 6.25 MG tablet Take 1 tablet (6.25 mg total) by mouth 2 (two) times daily with a meal. 04/17/14  Yes Kathlen Mody, MD  diclofenac sodium (VOLTAREN) 1 % GEL Apply 4 g topically 4 (four) times daily as needed (pain).    Yes Historical Provider, MD  diphenhydrAMINE (BENADRYL) 50 MG capsule Take 50 mg by mouth at bedtime as needed for sleep.   Yes Historical Provider, MD  FLUoxetine (PROZAC) 20 MG capsule Take 20 mg by mouth daily.   Yes Historical Provider, MD  guaiFENesin-dextromethorphan (ROBITUSSIN DM) 100-10 MG/5ML syrup Take 5 mLs by mouth every 4 (four) hours as needed for cough. 04/17/14  Yes Kathlen Mody, MD  hydrALAZINE (APRESOLINE) 25 MG tablet Take 1 tablet (25 mg total) by mouth every 8 (eight) hours. 04/17/14  Yes Kathlen Mody, MD  HYDROcodone-acetaminophen  (NORCO/VICODIN) 5-325 MG per tablet Take 1 tablet by mouth every 6 (six) hours as needed for moderate pain. 04/14/14  Yes Jacquelin Hawking, MD  hydrOXYzine (ATARAX/VISTARIL) 25 MG tablet Take 25 mg by mouth 3 (three) times daily.   Yes Historical Provider, MD  lamiVUDine (EPIVIR) 10 MG/ML solution Take 50 mg by mouth daily.   Yes Historical Provider, MD  multivitamin (RENA-VIT) TABS tablet Take 1 tablet by mouth daily.   Yes Historical Provider, MD  omeprazole (PRILOSEC) 20 MG capsule Take 20 mg by mouth daily.   Yes Historical Provider, MD  polyethylene glycol (MIRALAX / GLYCOLAX) packet Take 17 g by mouth daily as needed for mild constipation.    Yes Historical Provider, MD  Protein POWD Take 1 scoop by mouth 3 (three) times daily. Mixed with applesauce   Yes Historical Provider, MD  raltegravir (ISENTRESS) 400 MG tablet Take 400 mg by mouth 2 (two) times daily.    Yes Historical Provider, MD  senna-docusate (SENOKOT-S) 8.6-50 MG per tablet Take 2 tablets by mouth daily.   Yes Historical Provider, MD  sevelamer carbonate (RENVELA) 800 MG tablet Take 1,600-2,400 mg by mouth 3 (three) times daily with meals. *takes 2400mg  with meals and 1600mg  with snacks*   Yes Historical Provider, MD  tenofovir (VIREAD) 300 MG tablet Take 300 mg by mouth every Friday. Takes on Fridays.   Yes Historical Provider, MD  ipratropium-albuterol (DUONEB) 0.5-2.5 (3) MG/3ML SOLN Take 3 mLs by nebulization every 6 (six) hours as needed. 04/17/14   Kathlen Mody, MD  predniSONE (DELTASONE) 20 MG tablet Prednisone 20 mg daily for 3 days and stop. 04/17/14   Kathlen Mody, MD   BP 163/94  Pulse 104  Temp(Src) 98.5 F (36.9 C) (Oral)  Resp 14  SpO2 97% Physical Exam  Nursing note and vitals reviewed. Constitutional: She is oriented to person, place, and time. She appears well-developed and well-nourished. No distress.  HENT:  Head: Normocephalic and atraumatic.  Right Ear: External ear normal.  Left Ear: External ear normal.   Nose: Nose normal.  Mouth/Throat: Oropharynx is clear and moist.  Eyes: Conjunctivae are normal.  Neck: Normal range of motion. Neck supple.  Cardiovascular: Normal rate, regular rhythm and normal heart sounds.   Pulmonary/Chest: Effort normal. No accessory muscle usage. No respiratory distress. She has decreased breath sounds in the right lower field and the left lower field. She has rales (upper lobes). She exhibits no tenderness.  Talking in complete sentences w/o difficulty.   Abdominal: Soft. There is no tenderness.  Musculoskeletal: Normal range of motion. She exhibits no  edema.  Neurological: She is alert and oriented to person, place, and time.  Skin: Skin is warm and dry. She is not diaphoretic.  AV fistual with good thrill.   Psychiatric: She has a normal mood and affect.    ED Course  Procedures (including critical care time) Labs Review Labs Reviewed  CBC WITH DIFFERENTIAL - Abnormal; Notable for the following:    RBC 3.06 (*)    Hemoglobin 10.2 (*)    HCT 31.6 (*)    MCV 103.3 (*)    All other components within normal limits  COMPREHENSIVE METABOLIC PANEL - Abnormal; Notable for the following:    Chloride 91 (*)    CO2 17 (*)    Glucose, Bld 155 (*)    BUN 91 (*)    Creatinine, Ser 13.19 (*)    Calcium 8.3 (*)    Alkaline Phosphatase 271 (*)    GFR calc non Af Amer 3 (*)    GFR calc Af Amer 3 (*)    Anion gap 30 (*)    All other components within normal limits  TROPONIN I    Imaging Review Dg Shoulder Left  04/19/2014   CLINICAL DATA:  Painful left shoulder  EXAM: LEFT SHOULDER - 2+ VIEW  COMPARISON:  None.  FINDINGS: Glenohumeral joint is intact. No evidence of scapular fracture or humeral fracture. The acromioclavicular joint is intact.  IMPRESSION: No acute osseous abnormality.   Electronically Signed   By: Genevive BiStewart  Edmunds M.D.   On: 04/19/2014 07:46     EKG Interpretation None      MDM   Final diagnoses:  None    Filed Vitals:   04/21/14  0456  BP: 163/94  Pulse: 104  Temp:   Resp: 14   Afebrile, NAD, non-toxic appearing, AAOx4. Diminished breath sounds at bases, crackles in upper lobes. Oxygen requirement. I have reviewed nursing notes, vital signs, and all appropriate lab and imaging results for this patient. Patient will likely require admission for dialysis. Will sign patient out to Dr. Judd Lienelo who will consult Nephrology.   Patient d/w with Dr. Judd Lienelo, agrees with plan.         Jeannetta EllisJennifer L Francois Elk, PA-C 04/21/14 0559

## 2014-04-21 NOTE — Progress Notes (Signed)
Patient admitted via stretcher from the Ed. Patient belongings in a white patient belongings bag as follows:  1 pair black flat shoes, gray t-shirt, camo colored pants, black pocketbook, 1 cellphone, no wheelchair, no charger noted. Patient wearing a pair of brown colored glasses.   Belongings placed in closet. [name], [name]

## 2014-04-22 LAB — GLUCOSE, CAPILLARY
Glucose-Capillary: 101 mg/dL — ABNORMAL HIGH (ref 70–99)
Glucose-Capillary: 164 mg/dL — ABNORMAL HIGH (ref 70–99)

## 2014-04-22 NOTE — Progress Notes (Signed)
Patient Discharge: Patient discharged to niece house via w/c with a staff member accompanying till the exit, via cab. Education: Patient was educated on fluid overload symptoms, fluid restriction and on education. Telemetry: Removed before the discharge. Prescriptions: No new medications, no hard scripts Belongings: Patient took all her belongings with her.

## 2014-04-22 NOTE — Care Management Note (Signed)
CARE MANAGEMENT NOTE 04/22/2014  Patient:  [name],[name]   Account Number:  [id number]  Date Initiated:  04/21/2014  Documentation initiated by:  Johny ShockOYAL,Tally Mckinnon  Subjective/Objective Assessment:   Pt recently left hospital AMA, readm with request for hospital bed.     Action/Plan:   Noted that ED CM has setup pt to go to [facility] for followup and has reviewed transportation options with pt.  Request for hospital bed noted, MD will need to qualify this pt as this need is unclear to CM. t   Anticipated DC Date:  04/23/2014   Anticipated DC Plan:  HOME/SELF CARE      DC Planning Services  CM consult      Choice offered to / List presented to:     DME arranged  [facility]      DME agency  [facility]        Status of service:  Completed, signed off Medicare Important Message given?  NA - LOS <3 / Initial given by admissions (If response is "NO", the following Medicare IM given date fields will be blank) Date Medicare IM given:   Medicare IM given by:   Date Additional Medicare IM given:   Additional Medicare IM given by:    Discharge Disposition:    Per UR Regulation:    If discussed at Long Length of Stay Meetings, dates discussed:    Comments:

## 2014-04-22 NOTE — Clinical Social Work Note (Signed)
Patient discharging home and requesting assistance with transport. CSW facilitated transport home via cab voucher.  Genelle BalVanessa Maressa Apollo, MSW, LCSW [phone]

## 2014-04-22 NOTE — Discharge Summary (Signed)
Physician Discharge Summary  Patient ID: [name] MRN: [id number] DOB/AGE: [birth year] 63 y.o.  Admit date: 04/21/2014 Discharge date: 04/22/2014  Admission Diagnoses: 1 Resp distress / pulm edema 2 ESRD on HD 3 HIV on ART 4 Hx homelessness and drug abuse 5 Anemia of CKD 6 HPTH 7 HTN  Discharge Diagnoses:  1 Noncardiogenic pulm edema 2 Volume excess 3 ESRD 4 HIV 5 Hx homelessness and drug abuse 6 Anemia 7 HPTH 8 HTN  Discharged Condition: good  Hospital Course: 63 yo AAF on HD since 2009, also HIV, obesity, HTN, anxiety, homelessness and hx of polysubstance abuse, presented to ED overnight with SOB and cough. No fevers, no sputum production. Some L shoulder pain. Troponins were negative. CXR showed +pulm edema. Pt has been in the process of moving from [place] to [place] and has missed HD in [place] as she says she cannot get there , has no transportation. She says that she is now enrolled at a HD unit in [place] (? [place]) and was supposed to start there today but she got SOB last night and "couldn't make it". She denies and n/v/d, abd pain, jt pain, HA , confusion. She is living w her nieces now. She has been previously on dialysis at several other HD units in the region.   Problems 1 Acute pulm edema due to vol overload- resolved with HD 2 ESRD on HD- she is set up to start dialysis at  [facility] on [facility], second shift, and has transportation set up for TTS. She is no longer doing dialysis in [place]. The patient does not know where this dialysis unit is but hopefully this won't prevent her from getting there via public transportation. I gave her the name and address. She says she is living with her nieces. I tried to contact family and called the two phone numbers in the chart for the son and niece but neither number worked - one didn't go through and the other was a wrong number.   3 HIV on ART- no change 4 HTN no chg in meds 5 Obesity 6 Deconditioning- order  written for hospital bed at home per pt request   Discharge Exam: Blood pressure 155/89, pulse 92, temperature 98.9 F (37.2 C), temperature source Oral, resp. rate 21, weight 113.5 kg (250 lb 3.6 oz), SpO2 96.00%. Alert, no distress No jvd Faint rales L base, R clear RRR no MRG Abd obese, soft, ND Trace LE edema bilat Patent LFA fistula Neuro is nf, Ox 3  Disposition: 01-Home or Self Care     Medication List    STOP taking these medications       predniSONE 20 MG tablet  Commonly known as:  DELTASONE      TAKE these medications       amitriptyline 50 MG tablet  Commonly known as:  ELAVIL  Take 50 mg by mouth at bedtime.     amLODipine 10 MG tablet  Commonly known as:  NORVASC  Take 1 tablet (10 mg total) by mouth at bedtime.     aspirin EC 81 MG tablet  Take 81 mg by mouth daily.     BC HEADACHE PO  Take 2 packets by mouth 2 (two) times daily as needed. pain     carvedilol 6.25 MG tablet  Commonly known as:  COREG  Take 1 tablet (6.25 mg total) by mouth 2 (two) times daily with a meal.     diclofenac sodium 1 % Gel  Commonly known as:  VOLTAREN  Apply 4 g topically 4 (four) times daily as needed (pain).     diphenhydrAMINE 50 MG capsule  Commonly known as:  BENADRYL  Take 50 mg by mouth at bedtime as needed for sleep.     FLUoxetine 20 MG capsule  Commonly known as:  PROZAC  Take 20 mg by mouth daily.     guaiFENesin-dextromethorphan 100-10 MG/5ML syrup  Commonly known as:  ROBITUSSIN DM  Take 5 mLs by mouth every 4 (four) hours as needed for cough.     hydrALAZINE 25 MG tablet  Commonly known as:  APRESOLINE  Take 1 tablet (25 mg total) by mouth every 8 (eight) hours.     HYDROcodone-acetaminophen 5-325 MG per tablet  Commonly known as:  NORCO/VICODIN  Take 1 tablet by mouth every 6 (six) hours as needed for moderate pain.     hydrOXYzine 25 MG tablet  Commonly known as:  ATARAX/VISTARIL  Take 25 mg by mouth 3 (three) times daily.      ipratropium-albuterol 0.5-2.5 (3) MG/3ML Soln  Commonly known as:  DUONEB  Take 3 mLs by nebulization every 6 (six) hours as needed.     lamiVUDine 10 MG/ML solution  Commonly known as:  EPIVIR  Take 50 mg by mouth daily.     multivitamin Tabs tablet  Take 1 tablet by mouth daily.     omeprazole 20 MG capsule  Commonly known as:  PRILOSEC  Take 20 mg by mouth daily.     polyethylene glycol packet  Commonly known as:  MIRALAX / GLYCOLAX  Take 17 g by mouth daily as needed for mild constipation.     Protein Powd  Take 1 scoop by mouth 3 (three) times daily. Mixed with applesauce     raltegravir 400 MG tablet  Commonly known as:  ISENTRESS  Take 400 mg by mouth 2 (two) times daily.     senna-docusate 8.6-50 MG per tablet  Commonly known as:  Senokot-S  Take 2 tablets by mouth daily.     sevelamer carbonate 800 MG tablet  Commonly known as:  RENVELA  Take 1,600-2,400 mg by mouth 3 (three) times daily with meals. *takes 2400mg  with meals and 1600mg  with snacks*     tenofovir 300 MG tablet  Commonly known as:  VIREAD  Take 300 mg by mouth every Friday. Takes on Fridays.         SignedDelano Metz: Felecia Stanfill D 04/22/2014, 12:55 PM

## 2014-04-30 ENCOUNTER — Encounter (HOSPITAL_COMMUNITY): Payer: Self-pay | Admitting: Emergency Medicine

## 2014-04-30 ENCOUNTER — Telehealth: Payer: Self-pay

## 2014-04-30 ENCOUNTER — Emergency Department (HOSPITAL_COMMUNITY)
Admission: EM | Admit: 2014-04-30 | Discharge: 2014-04-30 | Disposition: A | Discharge status: Home / Self Care | Payer: Medicare Other | Attending: Emergency Medicine | Admitting: Emergency Medicine

## 2014-04-30 ENCOUNTER — Emergency Department (HOSPITAL_COMMUNITY): Payer: Medicare Other

## 2014-04-30 DIAGNOSIS — Z21 Asymptomatic human immunodeficiency virus [HIV] infection status: Secondary | ICD-10-CM | POA: Diagnosis not present

## 2014-04-30 DIAGNOSIS — F17200 Nicotine dependence, unspecified, uncomplicated: Secondary | ICD-10-CM | POA: Diagnosis not present

## 2014-04-30 DIAGNOSIS — M129 Arthropathy, unspecified: Secondary | ICD-10-CM | POA: Insufficient documentation

## 2014-04-30 DIAGNOSIS — Z862 Personal history of diseases of the blood and blood-forming organs and certain disorders involving the immune mechanism: Secondary | ICD-10-CM | POA: Insufficient documentation

## 2014-04-30 DIAGNOSIS — Z79899 Other long term (current) drug therapy: Secondary | ICD-10-CM | POA: Insufficient documentation

## 2014-04-30 DIAGNOSIS — Z7982 Long term (current) use of aspirin: Secondary | ICD-10-CM | POA: Insufficient documentation

## 2014-04-30 DIAGNOSIS — Z9889 Other specified postprocedural states: Secondary | ICD-10-CM | POA: Insufficient documentation

## 2014-04-30 DIAGNOSIS — R197 Diarrhea, unspecified: Secondary | ICD-10-CM | POA: Insufficient documentation

## 2014-04-30 DIAGNOSIS — N186 End stage renal disease: Secondary | ICD-10-CM

## 2014-04-30 DIAGNOSIS — R079 Chest pain, unspecified: Secondary | ICD-10-CM | POA: Insufficient documentation

## 2014-04-30 DIAGNOSIS — R05 Cough: Secondary | ICD-10-CM | POA: Insufficient documentation

## 2014-04-30 DIAGNOSIS — R059 Cough, unspecified: Secondary | ICD-10-CM | POA: Insufficient documentation

## 2014-04-30 DIAGNOSIS — I509 Heart failure, unspecified: Secondary | ICD-10-CM | POA: Diagnosis not present

## 2014-04-30 DIAGNOSIS — K219 Gastro-esophageal reflux disease without esophagitis: Secondary | ICD-10-CM | POA: Insufficient documentation

## 2014-04-30 DIAGNOSIS — F3289 Other specified depressive episodes: Secondary | ICD-10-CM | POA: Diagnosis not present

## 2014-04-30 DIAGNOSIS — I120 Hypertensive chronic kidney disease with stage 5 chronic kidney disease or end stage renal disease: Secondary | ICD-10-CM | POA: Insufficient documentation

## 2014-04-30 DIAGNOSIS — F411 Generalized anxiety disorder: Secondary | ICD-10-CM | POA: Insufficient documentation

## 2014-04-30 DIAGNOSIS — E875 Hyperkalemia: Secondary | ICD-10-CM | POA: Diagnosis not present

## 2014-04-30 DIAGNOSIS — R000 Tachycardia, unspecified: Secondary | ICD-10-CM | POA: Insufficient documentation

## 2014-04-30 DIAGNOSIS — Z8701 Personal history of pneumonia (recurrent): Secondary | ICD-10-CM | POA: Insufficient documentation

## 2014-04-30 DIAGNOSIS — F329 Major depressive disorder, single episode, unspecified: Secondary | ICD-10-CM | POA: Insufficient documentation

## 2014-04-30 DIAGNOSIS — I209 Angina pectoris, unspecified: Secondary | ICD-10-CM | POA: Insufficient documentation

## 2014-04-30 DIAGNOSIS — Z992 Dependence on renal dialysis: Secondary | ICD-10-CM | POA: Insufficient documentation

## 2014-04-30 LAB — COMPREHENSIVE METABOLIC PANEL
ALBUMIN: 3.5 g/dL (ref 3.5–5.2)
ALT: 7 U/L (ref 0–35)
ANION GAP: 22 — AB (ref 5–15)
AST: 13 U/L (ref 0–37)
Alkaline Phosphatase: 365 U/L — ABNORMAL HIGH (ref 39–117)
BILIRUBIN TOTAL: 0.3 mg/dL (ref 0.3–1.2)
BUN: 60 mg/dL — ABNORMAL HIGH (ref 6–23)
CHLORIDE: 97 meq/L (ref 96–112)
CO2: 23 mEq/L (ref 19–32)
Calcium: 9.3 mg/dL (ref 8.4–10.5)
Creatinine, Ser: 11.12 mg/dL — ABNORMAL HIGH (ref 0.50–1.10)
GFR calc Af Amer: 4 mL/min — ABNORMAL LOW (ref >90)
GFR, EST NON AFRICAN AMERICAN: 3 mL/min — AB (ref >90)
Glucose, Bld: 117 mg/dL — ABNORMAL HIGH (ref 70–99)
Potassium: 6.1 mEq/L — ABNORMAL HIGH (ref 3.7–5.3)
Sodium: 142 mEq/L (ref 137–147)
Total Protein: 7.8 g/dL (ref 6.0–8.3)

## 2014-04-30 LAB — CBC WITH DIFFERENTIAL/PLATELET
Basophils Absolute: 0 10*3/uL (ref 0.0–0.1)
Basophils Relative: 0 % (ref 0–1)
EOS ABS: 0.1 10*3/uL (ref 0.0–0.7)
Eosinophils Relative: 1 % (ref 0–5)
HCT: 34.1 % — ABNORMAL LOW (ref 36.0–46.0)
HEMOGLOBIN: 10.9 g/dL — AB (ref 12.0–15.0)
Lymphocytes Relative: 37 % (ref 12–46)
Lymphs Abs: 2.7 10*3/uL (ref 0.7–4.0)
MCH: 33.9 pg (ref 26.0–34.0)
MCHC: 32 g/dL (ref 30.0–36.0)
MCV: 105.9 fL — AB (ref 78.0–100.0)
MONOS PCT: 11 % (ref 3–12)
Monocytes Absolute: 0.8 10*3/uL (ref 0.1–1.0)
NEUTROS ABS: 3.8 10*3/uL (ref 1.7–7.7)
Neutrophils Relative %: 51 % (ref 43–77)
Platelets: 261 10*3/uL (ref 150–400)
RBC: 3.22 MIL/uL — ABNORMAL LOW (ref 3.87–5.11)
RDW: 13.9 % (ref 11.5–15.5)
WBC: 7.4 10*3/uL (ref 4.0–10.5)

## 2014-04-30 LAB — I-STAT TROPONIN, ED: Troponin i, poc: 0.03 ng/mL (ref 0.00–0.08)

## 2014-04-30 LAB — LIPASE, BLOOD: Lipase: 56 U/L (ref 11–59)

## 2014-04-30 LAB — HEPATITIS C ANTIBODY: HCV AB: NEGATIVE

## 2014-04-30 MED ORDER — ONDANSETRON HCL 4 MG/2ML IJ SOLN
4.0000 mg | Freq: Once | INTRAMUSCULAR | Status: AC
  Administered 2014-04-30: 4 mg via INTRAVENOUS
  Filled 2014-04-30: qty 2

## 2014-04-30 MED ORDER — DEXTROSE 50 % IV SOLN
1.0000 | Freq: Once | INTRAVENOUS | Status: AC
  Administered 2014-04-30: 50 mL via INTRAVENOUS
  Filled 2014-04-30: qty 50

## 2014-04-30 MED ORDER — INSULIN ASPART 100 UNIT/ML IV SOLN
5.0000 [IU] | Freq: Once | INTRAVENOUS | Status: AC
  Administered 2014-04-30: 5 [IU] via INTRAVENOUS
  Filled 2014-04-30: qty 0.05

## 2014-04-30 MED ORDER — HYDROMORPHONE HCL PF 1 MG/ML IJ SOLN
1.0000 mg | Freq: Once | INTRAMUSCULAR | Status: AC
  Administered 2014-04-30: 1 mg via INTRAVENOUS
  Filled 2014-04-30: qty 1

## 2014-04-30 MED ORDER — SODIUM CHLORIDE 0.9 % IV SOLN
1.0000 g | Freq: Once | INTRAVENOUS | Status: AC
  Administered 2014-04-30: 1 g via INTRAVENOUS
  Filled 2014-04-30: qty 10

## 2014-04-30 NOTE — ED Notes (Signed)
Pt placed on 2L [place] for desat to the 80s after dilaudid administration

## 2014-04-30 NOTE — Progress Notes (Signed)
  CARE MANAGEMENT ED NOTE 04/30/2014  Patient:  [name],[name]   Account Number:  [id number]  Date Initiated:  04/30/2014  Documentation initiated by:  Ferdinand CavaSCHETTINO,Tearra Ouk  Subjective/Objective Assessment:   63 yo female presenting to the ED with c/o persistent cough     Subjective/Objective Assessment Detail:     Action/Plan:   Patient will follow up at [facility] on 05/13/14 at 1:30 and this CM will follow up and scheduled Medicaid transportation   Action/Plan Detail:   Anticipated DC Date:       Status Recommendation to Physician:   Result of Recommendation:  Agreed    DC Planning Services  CM consult  Follow-up appt scheduled  Other    Choice offered to / List presented to:  C-1 Patient  DME arranged  [facility]     DME agency  [facility]        Status of service:  Completed, signed off  ED Comments:   ED Comments Detail:  This CM spoke with patient regarding follow up contact from previous ED visit in which this CM started the process of connecting patient to services. The patient informed this CM that she has been going to her dialysis appointments since Medicaid transportation has been set up. She stated that she has not received a call from the [facility] to establish care. This CM then spoke with Tammy, the [facility] intake coordinator, to set up a ED follow up visit with Dr. Luciana Axeomer. Tammy requested specific lab work be performed in the ED for the ID follow up appointment. ED PA Lauren was able to order specific requested lab work. An appointment was established for 05/13/14 at 1:30 at the [facility]. This CM then called Medicaid transportation but appointment is out of window so will call back tomorrow to set up transportation for appointment. The ED PA requested a walker for the patient. This order was placed and the patient was instructed [facility] and to bring the discharge paper work with the order to retrieve the ordered equipment. The patient  was provided written instructions for the equipment pick up and the appointment established. The patient verbalized understanding. Unable to deliver walker to patient room as [facility] had already been contacted for pick up for patient to be taken to dialysis center for treatment. Patient verbalized understanding in picking up equipment and stated that she can have her niece pick it up. Doris CSW contacted [facility] [address]. No other questions or concerns at this time.

## 2014-04-30 NOTE — Telephone Encounter (Addendum)
[facility].   It appears she is currently in the process of being seen in the ED I will call Case Manager , Ferdinand CavaAndrea Schettino.   I spoke with Sue Lushndrea and patient is in need of ID referral . She has dialysis on Tuesday, Thursday and Saturday.  She will need to be seen ASAP. Patient has not been on antiretrovirals.  I have asked for basic labs to be added at today's visit in ED.   Patient was added to Dr Comer's schedule for 05-13-14 @ 1:30 pm.  I will [facility]  [facility].   Laurell Josephsammy K Lanya Bucks, RN

## 2014-04-30 NOTE — ED Notes (Signed)
Pt arrives via EMS from nieces apartment where she is currently living and helping to pay bills. Began yesterday with substernal chest pain with cough, deep breathing and movement. Pt with productive yellow cough. Pt admits to occasional cigarette use. Denies fever. C/o nausea but no vomiting. Had some diarrhea last night. Tues, Thurs, SAt dialysis. Due today. Noncompliant with medications at home. Currently awake, alert oriented x4, NAD at present.

## 2014-04-30 NOTE — ED Provider Notes (Signed)
Date: 04/30/2014  Rate: 101   Rhythm: sinus tachycardia  QRS Axis: normal  Intervals: borderline prolonged QT  ST/T Wave abnormalities: normal  Conduction Disutrbances:none  Narrative Interpretation:   Old EKG Reviewed: unchanged  Medical screening examination/treatment/procedure(s) were conducted as a shared visit with non-physician practitioner(s) and myself.  I personally evaluated the patient during the encounter.   EKG Interpretation None      Pt presents w/ cough, and CP only w/ cough,milly for several days, worse today. She has multiple complex medical issues, moved to [place] w/o establishing medical care. Denies CP currently, is in NAD. No significant fluid overload on PE, +ttp chest wall. No acute EKG changes, Trop nml.  K 6.1,  We spoke w/ nephrology who agrees with our plan to go from ED to HD. Social work/case management involved in case given frequent ED visits, medical non-compliance and lack of local PCP. She is being scheduled for local ID clinic.   Doubt ACS, pna, PE.   1. Hyperkalemia   2. ESRD on hemodialysis   3. Cough   4. Diarrhea      Shanna CiscoMegan E Docherty, MD 04/30/14 [phone]

## 2014-04-30 NOTE — ED Provider Notes (Signed)
CSN: [id number]     Arrival date & time 04/30/14  0810 History   First MD Initiated Contact with Patient 04/30/14 0818     Chief Complaint  Patient presents with  . Chest Pain  . Diarrhea     (Consider location/radiation/quality/duration/timing/severity/associated sxs/prior Treatment) HPI Comments: Patient is a 63 yo F PMHx significant for HTN, HIV, ESRD on dialysis T,TH,S, CHF, depression, anxiety, anemia, A. Fib, presenting to the emergency apartment with chief complaint of left-sided chest discomfort since last night with associated productive cough and abdominal pain since yesterday. The patient states chest discomfort only occurs with cough.  The patient reports lower abdominal discomfort and at the gastric discomfort since yesterday. She reports 3 episodes of diarrhea since yesterday morning denies blood or pus. She reports one episode of emesis. The patient also complains of left-sided chest discomfort, worsened with cough denies shortness of breath. Denies fever. Endorses chills. The patient reports last hemodialysis Tuesday, 3.5 hour run. HD T/Th/Sat. Just complains of for living situation. She reports living with a family member, states unable to medication due to lack of light. NO PCP  Patient is a 63 y.o. female presenting with chest pain and diarrhea. The history is provided by the patient. No language interpreter was used.  Chest Pain Associated symptoms: abdominal pain and cough   Associated symptoms: no fever and no palpitations   Diarrhea Associated symptoms: abdominal pain   Associated symptoms: no chills and no fever     Past Medical History  Diagnosis Date  . Hypertension   . HIV infection   . Morbid obesity   . ESRD (end stage renal disease) on dialysis 09/30/2013    Started dialysis in [place], [place] around 2009.  ESRD due to HTN vs drug abuse according to pt.  Was on dialysis at [facility] until Feb 2015 when she was admitted to a SNF due to homelessness and drug  abuse.  Then changed to [facility] on TTS schedule.     . Anginal pain   . CHF (congestive heart failure)   . Shortness of breath   . Pneumonia   . Depression   . Anxiety   . GERD (gastroesophageal reflux disease)   . Arthritis   . Anemia    Past Surgical History  Procedure Laterality Date  . Arteriovenous graft placement      left forearm  . Cardiac catheterization    . Laparotomy      states seh was cut open because seh was bleeding on the inside   Family History  Problem Relation Age of Onset  . Kidney failure Other     niece  . High blood pressure    . Lung cancer    . Breast cancer Neg Hx   . Colon cancer Neg Hx   . Stroke Mother   . HIV/AIDS Brother     died of AIDS   History  Substance Use Topics  . Smoking status: Current Every Day Smoker -- 0.50 packs/day    Types: Cigarettes  . Smokeless tobacco: Never Used  . Alcohol Use: No   OB History   Grav Para Term Preterm Abortions TAB SAB Ect Mult Living                 Review of Systems  Constitutional: Negative for fever and chills.  Respiratory: Positive for cough.   Cardiovascular: Positive for chest pain. Negative for palpitations and leg swelling.  Gastrointestinal: Positive for abdominal pain and diarrhea.  Negative for blood in stool.      Allergies  Tramadol and Morphine and related  Home Medications   Prior to Admission medications   Medication Sig Start Date End Date Taking? Authorizing Provider  amitriptyline (ELAVIL) 50 MG tablet Take 50 mg by mouth at bedtime.    Historical Provider, MD  amLODipine (NORVASC) 10 MG tablet Take 1 tablet (10 mg total) by mouth at bedtime. 04/17/14   Kathlen Mody, MD  aspirin EC 81 MG tablet Take 81 mg by mouth daily.    Historical Provider, MD  Aspirin-Salicylamide-Caffeine (BC HEADACHE PO) Take 2 packets by mouth 2 (two) times daily as needed. pain    Historical Provider, MD  carvedilol (COREG) 6.25 MG tablet Take 1 tablet (6.25 mg total) by mouth 2 (two)  times daily with a meal. 04/17/14   Kathlen Mody, MD  diclofenac sodium (VOLTAREN) 1 % GEL Apply 4 g topically 4 (four) times daily as needed (pain).     Historical Provider, MD  diphenhydrAMINE (BENADRYL) 50 MG capsule Take 50 mg by mouth at bedtime as needed for sleep.    Historical Provider, MD  FLUoxetine (PROZAC) 20 MG capsule Take 20 mg by mouth daily.    Historical Provider, MD  guaiFENesin-dextromethorphan (ROBITUSSIN DM) 100-10 MG/5ML syrup Take 5 mLs by mouth every 4 (four) hours as needed for cough. 04/17/14   Kathlen Mody, MD  hydrALAZINE (APRESOLINE) 25 MG tablet Take 1 tablet (25 mg total) by mouth every 8 (eight) hours. 04/17/14   Kathlen Mody, MD  HYDROcodone-acetaminophen (NORCO/VICODIN) 5-325 MG per tablet Take 1 tablet by mouth every 6 (six) hours as needed for moderate pain. 04/14/14   Jacquelin Hawking, MD  hydrOXYzine (ATARAX/VISTARIL) 25 MG tablet Take 25 mg by mouth 3 (three) times daily.    Historical Provider, MD  ipratropium-albuterol (DUONEB) 0.5-2.5 (3) MG/3ML SOLN Take 3 mLs by nebulization every 6 (six) hours as needed. 04/17/14   Kathlen Mody, MD  lamiVUDine (EPIVIR) 10 MG/ML solution Take 50 mg by mouth daily.    Historical Provider, MD  multivitamin (RENA-VIT) TABS tablet Take 1 tablet by mouth daily.    Historical Provider, MD  omeprazole (PRILOSEC) 20 MG capsule Take 20 mg by mouth daily.    Historical Provider, MD  polyethylene glycol (MIRALAX / GLYCOLAX) packet Take 17 g by mouth daily as needed for mild constipation.     Historical Provider, MD  Protein POWD Take 1 scoop by mouth 3 (three) times daily. Mixed with applesauce    Historical Provider, MD  raltegravir (ISENTRESS) 400 MG tablet Take 400 mg by mouth 2 (two) times daily.     Historical Provider, MD  senna-docusate (SENOKOT-S) 8.6-50 MG per tablet Take 2 tablets by mouth daily.    Historical Provider, MD  sevelamer carbonate (RENVELA) 800 MG tablet Take 1,600-2,400 mg by mouth 3 (three) times daily with meals.  *takes 2400mg  with meals and 1600mg  with snacks*    Historical Provider, MD  tenofovir (VIREAD) 300 MG tablet Take 300 mg by mouth every Friday. Takes on Fridays.    Historical Provider, MD   BP 180/93  Pulse 99  Temp(Src) 98.5 F (36.9 C)  Resp 22  SpO2 96% Physical Exam  Nursing note and vitals reviewed. Constitutional: She is oriented to person, place, and time. She appears well-developed and well-nourished. No distress.  HENT:  Head: Normocephalic and atraumatic.  Eyes: EOM are normal. Pupils are equal, round, and reactive to light.  Neck: Normal range of motion. Neck  supple.  Cardiovascular: Regular rhythm.  Tachycardia present.   Trace lower extremity edema. Fistula to Left forearm, palpable thrill  Pulmonary/Chest: Effort normal and breath sounds normal. No respiratory distress. She has no wheezes. She has no rales.  Abdominal: Soft. Bowel sounds are normal. There is tenderness in the right lower quadrant, epigastric area, suprapubic area and left lower quadrant. There is no rebound and no guarding.  Multiple well healed scars. Moderate epigastric tenderness, lower abdominal pain.  Musculoskeletal: Normal range of motion.  Neurological: She is alert and oriented to person, place, and time.  Skin: Skin is warm and dry. She is not diaphoretic.  Psychiatric: She has a normal mood and affect.    ED Course  Procedures (including critical care time) Labs Review Results for orders placed during the hospital encounter of 04/30/14  CBC WITH DIFFERENTIAL      Result Value Ref Range   WBC 7.4  4.0 - 10.5 K/uL   RBC 3.22 (*) 3.87 - 5.11 MIL/uL   Hemoglobin 10.9 (*) 12.0 - 15.0 g/dL   HCT 16.134.1 (*) 09.636.0 - 04.546.0 %   MCV 105.9 (*) 78.0 - 100.0 fL   MCH 33.9  26.0 - 34.0 pg   MCHC 32.0  30.0 - 36.0 g/dL   RDW 40.913.9  81.111.5 - 91.415.5 %   Platelets 261  150 - 400 K/uL   Neutrophils Relative % 51  43 - 77 %   Neutro Abs 3.8  1.7 - 7.7 K/uL   Lymphocytes Relative 37  12 - 46 %   Lymphs Abs  2.7  0.7 - 4.0 K/uL   Monocytes Relative 11  3 - 12 %   Monocytes Absolute 0.8  0.1 - 1.0 K/uL   Eosinophils Relative 1  0 - 5 %   Eosinophils Absolute 0.1  0.0 - 0.7 K/uL   Basophils Relative 0  0 - 1 %   Basophils Absolute 0.0  0.0 - 0.1 K/uL  COMPREHENSIVE METABOLIC PANEL      Result Value Ref Range   Sodium 142  137 - 147 mEq/L   Potassium 6.1 (*) 3.7 - 5.3 mEq/L   Chloride 97  96 - 112 mEq/L   CO2 23  19 - 32 mEq/L   Glucose, Bld 117 (*) 70 - 99 mg/dL   BUN 60 (*) 6 - 23 mg/dL   Creatinine, Ser 78.2911.12 (*) 0.50 - 1.10 mg/dL   Calcium 9.3  8.4 - 56.210.5 mg/dL   Total Protein 7.8  6.0 - 8.3 g/dL   Albumin 3.5  3.5 - 5.2 g/dL   AST 13  0 - 37 U/L   ALT 7  0 - 35 U/L   Alkaline Phosphatase 365 (*) 39 - 117 U/L   Total Bilirubin 0.3  0.3 - 1.2 mg/dL   GFR calc non Af Amer 3 (*) >90 mL/min   GFR calc Af Amer 4 (*) >90 mL/min   Anion gap 22 (*) 5 - 15  LIPASE, BLOOD      Result Value Ref Range   Lipase 56  11 - 59 U/L  T-HELPER CELLS (CD4) COUNT      Result Value Ref Range   CD4 T Cell Abs 1010  400 - 2700 /uL   CD4 % Helper T Cell 36  33 - 55 %  HEPATITIS C ANTIBODY      Result Value Ref Range   HCV Ab NEGATIVE  NEGATIVE  I-STAT TROPOININ, ED  Result Value Ref Range   Troponin i, poc 0.03  0.00 - 0.08 ng/mL   Comment 3            Dg Chest 2 View  04/30/2014   CLINICAL DATA:  Chest pain.  EXAM: CHEST  2 VIEW  COMPARISON:  04/21/2014.  FINDINGS: Mediastinum and hilar structures are normal. Perihilar atelectasis present. Cardiomegaly is present. Previously identified pulmonary interstitial edema has partially cleared. No pleural effusion or pneumothorax.  IMPRESSION: 1.  Interim partial clearing of congestive heart failure.  2.  Perihilar subsegmental atelectasis.   Electronically Signed   By: Maisie Fus  Register   On: 04/30/2014 08:55    EKG Interpretation   Date/Time:  Thursday April 30 2014 08:12:51 EDT Ventricular Rate:  101 PR Interval:  186 QRS Duration: 100 QT  Interval:  376 QTC Calculation: 487 R Axis:   58 Text Interpretation:  Sinus tachycardia Borderline prolonged QT interval  ED PHYSICIAN INTERPRETATION AVAILABLE IN [facility] Confirmed by  TEST, Record ([id number]) on 05/02/2014 7:10:47 AM      MDM   Final diagnoses:  Hyperkalemia  ESRD on hemodialysis  Cough  Diarrhea   Patient with multiple chronic medical condition presents with left-sided chest pain and associated cough. Also complains of abdominal pain and diarrhea. Cough/chest pain likely due to pneumonia  will evaluate. Dr. Micheline Maze also evaluated the patient during this encounter. Discussed pt history and condition with Dr. Lacy Duverney, states stable for discharge to HD center. Re-evaluation, pt reports symptom improvement, resolution of abdominal pain. Pt his on HD at [facility] on [facility], second shift, and has transportation set up for TTS. Called and discussed discharge plan with HD center, agree if pt arrives by 1530, they will give HD the patient for several hours.  Clabe Seal, PA-C 05/02/14 1144

## 2014-04-30 NOTE — ED Notes (Signed)
Case management and social work at bedside with patient.

## 2014-04-30 NOTE — Discharge Instructions (Signed)
Report to the dialysis center after discharge.  Call for a follow up appointment with a Family or Primary Care Provider.  Return if Symptoms worsen.   Take medication as prescribed.    Emergency Department Resource Guide 1) Find a Doctor and Pay Out of Pocket Although you won't have to find out who is covered by your insurance plan, it is a good idea to ask around and get recommendations. You will then need to call the office and see if the doctor you have chosen will accept you as a new patient and what types of options they offer for patients who are self-pay. Some doctors offer discounts or will set up payment plans for their patients who do not have insurance, but you will need to ask so you aren't surprised when you get to your appointment.  2) Contact Your Local Health Department Not all health departments have doctors that can see patients for sick visits, but many do, so it is worth a call to see if yours does. If you don't know where your local health department is, you can check in your phone book. The CDC also has a tool to help you locate your state's health department, and many state websites also have listings of all of their local health departments.  3) Find a Walk-in Clinic If your illness is not likely to be very severe or complicated, you may want to try a walk in clinic. These are popping up all over the country in pharmacies, drugstores, and shopping centers. They're usually staffed by nurse practitioners or physician assistants that have been trained to treat common illnesses and complaints. They're usually fairly quick and inexpensive. However, if you have serious medical issues or chronic medical problems, these are probably not your best option.  No Primary Care Doctor: - Call Health Connect at  [phone] - they can help you locate a primary care doctor that  accepts your insurance, provides certain services, etc. - Physician Referral Service- [phone]  Chronic Pain  Problems: Organization         Address  Phone   Notes  [facility]  [phone]) [phone] Patients need to be referred by their primary care doctor.   Medication Assistance: Organization         Address  Phone   Notes  [facility] [address] [place], [place] [address] [phone] --Must [place] a resident of [place] [facility] [place] -- Must have NO insurance coverage whatsoever (no Medicaid/ Medicare, etc.) -- The pt. MUST have a primary care doctor that directs their care regularly and follows them in the community   MedAssist  [phone]   [employer]  [phone]    Agencies that provide inexpensive medical care: Organization         Address  Phone   Notes  [facility]  [phone]   [facility]    [phone]) [phone]   [facility] [address] [place], [place] [address] [phone]   [facility] [address], [place] [phone]   Planned Parenthood    [phone]   [facility]    [phone]   [facility]  [address], [place] Phone:  [phone]) [phone], Fax:  [phone] Hours of Operation:  9 am - 6 pm, M-F.  Also accepts Medicaid/Medicare and self-pay.  [facility]  [address], [place] Phone: [phone], Fax: [phone]. Hours of Operation:  8:30 am - 5:30 pm, M-F.  Also accepts Medicaid and self-pay.  °[facility] [place] [address], [place] Phone: [phone]   °[facility] [address], [place], [place] [phone], Ext. 123 Mondays & Thursdays: 7-9 AM.  First 15 patients are seen on a first come, first serve basis. °  ° °Medicaid-accepting [place] Providers: ° °Organization         Address  Phone   Notes  °[facility] [address], [place] [phone] Also  accepts self-pay patients.  °[facility] [address], [place] ° [phone]   °[facility] [address], [place] [phone]   °[facility] [address] [place] [address], [place] [phone]   °Veita Bland [address], [place]  ° [phone] Only accepts [place] Access Medicaid patients after they have their name applied to their card.  ° °Self-Pay (no insurance) in [place]: ° °Organization         Address  Phone   Notes  °Sickle Cell Patients, [facility] [address], [place] [phone]   °[facility] [address], [place] [phone]   °[facility] [place] ° [address], [place] [phone]   °[facility]/Dr. Osei-Bonsu ° [address] [place] [address], [place] or [address], [place] [phone] Phone number for both [place] and [place] locations is the same.  °[facility] [address], [place] [phone]   °[facility] [address] [place] [address], [place] or [address] [phone] °([phone]) [phone]   °[facility] [address], [place] [phone], phone; [phone], fax Sees patients 1st and 3rd Saturday of every month.  Must not qualify for public or private insurance (i.e. Medicaid, Medicare, [place] Health Choice, Veterans' Benefits) • Household income should be no more than 200% of the poverty level •The clinic cannot treat you if you are pregnant or think you are pregnant • Sexually transmitted diseases are not treated at the clinic.  ° ° °Dental Care: °Organization         Address  Phone  Notes  °[place] [facility] [address], [place] [phone]) [phone] Accepts children up to age 21 who are enrolled in Medicaid or [place] Health Choice; pregnant  women with a Medicaid card; and children who have applied for Medicaid or [place] Health Choice, but were declined, whose parents can pay a reduced fee at time of service.  °[place] [facility] [place]  [address], [place] [phone]) [phone] Accepts children up to age 21 who are enrolled in Medicaid or [place] Health Choice; pregnant women with a Medicaid card; and children who have applied for Medicaid or  Health Choice, but were declined, whose parents can pay a reduced fee at time of service.  °[facility] ° [address],  [phone] Patients are seen by appointment only. Walk-ins are not accepted. [facility] will see patients 18 years of age and older. °Monday - Tuesday (8am-5pm) °Most Wednesdays (8:30-5pm) °$30 per visit, cash only  °[facility]  [address], [facility] [phone] Patients are seen by appointment only. Walk-ins are not accepted. [place] will see patients 60 years of age and older. One Wednesday Evening (Monthly: Volunteer Based).  $30 per visit, cash only  [place]  [phone] for adults; Children under age 4, call Graduate Pediatric Dentistry at [phone]. Children aged 49-14, please call [phone] to request a pediatric application.  Dental services are provided in all areas of dental care including fillings, crowns and bridges, complete and partial dentures, implants, gum treatment, root canals, and extractions. Preventive care is also provided. Treatment is provided to both adults and children. Patients are selected via a lottery and there is often a waiting list.   [facility] [address] [place]  [phone] [email or web address]   [facility] [address] [place], [place] [phone], Ext. [phone] Second and Fourth Thursday of each month, opens at 6:30 AM; Clinic ends at 9 AM.  Patients are  seen on a first-come first-served basis, and a limited number are seen during each clinic.   [facility]  [address] Hillard Danker [place], [place] [phone]   Eligibility Requirements You must have lived in [place], [place], or [place] counties for at least the last three months.   You cannot be eligible for state or federal sponsored [employer], including [employer], [place], or [employer].   You generally cannot be eligible for healthcare insurance through your employer.    How to apply: Eligibility screenings are held every Tuesday and Wednesday afternoon from 1:00 pm until 4:00 pm. You do not need an appointment for the interview!  [facility] [address] [place], [place]   [place] Department  [place]  [phone]    Behavioral Health Resources in the Community: Intensive Outpatient Programs Organization         Address  Phone  Notes  [facility] [place]. [address], [place], [place] [phone]   [facility] [address], [place], [place]   ADS: [facility] [address], [place], [place]   [place] [address],  [place], [place] or [phone]   Substance Abuse Resources Organization         Address  Phone  Notes  [facility] Drug Services  629-655-4447   [place]  [phone]   [facility] [place]   Chinita Pester  [phone]   Residential & Outpatient Substance Abuse Program  [phone]   Psychological Services Organization         Address  Phone  Notes  [facility]  [place]  [phone]   [place] [address] [place] [phone] or 7327526419    Mobile Crisis  Teams Organization         Address  Phone  Notes  [facility], Mobile Crisis Care Unit  4045379558   [facility]  [address] [place], [place]   [facility] [address] [place] [phone]    Self-Help/Support Groups Organization         Address  Phone             Notes  [facility] [place] - variety of support groups  [place]thon Call for more information  Narcotics Anonymous (NA), [facility] [address], [employer] [place]  2 meetings at this location   Special educational needs teacher         Address  Phone  Notes  [facility] [place],    [place]  1-6845916161   [facility]  [address] [place] [address], [place], [place]   [place], [place] [phone] Admissions: 8am-3pm M-F  Incentives Substance [place] [address],    [place], [place] [phone]   [facility] [address] [place], [place], [place]   [facility] [address]  [place], [place]   [facility] - Intensive Outpatient [address], [place], [place]   [facility] ([place].,  [place], [place] [phone] or [phone]   [facility] ([facility]) [address] [place], [place] Accepts Medicaid  [facility] [place] [address],  [place] [phone])[phone] Substance Abuse/Addiction Treatment   [facility] Organization         Address  Phone  Notes  [facility]  [phone]   Domenic Schwab, PhD [address] Arlis [place], [place]   [phone] or 7312502485   [facility] [place], [place] [phone]   [facility] [address] [place], [place] [phone]  Insurance/Medicaid/sponsorship through [facility] and Families [address], [address] [place]                                    [place], [place] [phone] [place] [address], [place] [phone]    Dr. Adele Schilder  [phone]   [facility] 1) [address], [place] 2) [place] 3)  [place] [address], [place] [phone]  [phone]   [place] [phone] or [phone] (After Hours)

## 2014-05-01 ENCOUNTER — Emergency Department (HOSPITAL_COMMUNITY)
Admission: EM | Admit: 2014-05-01 | Discharge: 2014-05-02 | Disposition: A | Discharge status: Home / Self Care | Payer: Medicare Other | Attending: Emergency Medicine | Admitting: Emergency Medicine

## 2014-05-01 DIAGNOSIS — K219 Gastro-esophageal reflux disease without esophagitis: Secondary | ICD-10-CM | POA: Insufficient documentation

## 2014-05-01 DIAGNOSIS — Z79899 Other long term (current) drug therapy: Secondary | ICD-10-CM | POA: Insufficient documentation

## 2014-05-01 DIAGNOSIS — F17200 Nicotine dependence, unspecified, uncomplicated: Secondary | ICD-10-CM | POA: Diagnosis not present

## 2014-05-01 DIAGNOSIS — Z992 Dependence on renal dialysis: Secondary | ICD-10-CM | POA: Diagnosis not present

## 2014-05-01 DIAGNOSIS — F411 Generalized anxiety disorder: Secondary | ICD-10-CM | POA: Diagnosis not present

## 2014-05-01 DIAGNOSIS — D649 Anemia, unspecified: Secondary | ICD-10-CM | POA: Insufficient documentation

## 2014-05-01 DIAGNOSIS — F3289 Other specified depressive episodes: Secondary | ICD-10-CM | POA: Diagnosis not present

## 2014-05-01 DIAGNOSIS — I120 Hypertensive chronic kidney disease with stage 5 chronic kidney disease or end stage renal disease: Secondary | ICD-10-CM | POA: Insufficient documentation

## 2014-05-01 DIAGNOSIS — Z8701 Personal history of pneumonia (recurrent): Secondary | ICD-10-CM | POA: Insufficient documentation

## 2014-05-01 DIAGNOSIS — R609 Edema, unspecified: Secondary | ICD-10-CM | POA: Diagnosis not present

## 2014-05-01 DIAGNOSIS — Z7982 Long term (current) use of aspirin: Secondary | ICD-10-CM | POA: Insufficient documentation

## 2014-05-01 DIAGNOSIS — F329 Major depressive disorder, single episode, unspecified: Secondary | ICD-10-CM | POA: Insufficient documentation

## 2014-05-01 DIAGNOSIS — J069 Acute upper respiratory infection, unspecified: Secondary | ICD-10-CM | POA: Diagnosis not present

## 2014-05-01 DIAGNOSIS — M25559 Pain in unspecified hip: Secondary | ICD-10-CM | POA: Diagnosis not present

## 2014-05-01 DIAGNOSIS — N186 End stage renal disease: Secondary | ICD-10-CM | POA: Diagnosis not present

## 2014-05-01 DIAGNOSIS — Z21 Asymptomatic human immunodeficiency virus [HIV] infection status: Secondary | ICD-10-CM | POA: Diagnosis not present

## 2014-05-01 DIAGNOSIS — M129 Arthropathy, unspecified: Secondary | ICD-10-CM | POA: Insufficient documentation

## 2014-05-01 DIAGNOSIS — E875 Hyperkalemia: Secondary | ICD-10-CM | POA: Insufficient documentation

## 2014-05-01 DIAGNOSIS — R079 Chest pain, unspecified: Secondary | ICD-10-CM | POA: Diagnosis present

## 2014-05-01 DIAGNOSIS — I509 Heart failure, unspecified: Secondary | ICD-10-CM | POA: Insufficient documentation

## 2014-05-01 LAB — I-STAT CHEM 8, ED
BUN: 54 mg/dL — AB (ref 6–23)
Calcium, Ion: 0.99 mmol/L — ABNORMAL LOW (ref 1.13–1.30)
Chloride: 101 mEq/L (ref 96–112)
Creatinine, Ser: 11.5 mg/dL — ABNORMAL HIGH (ref 0.50–1.10)
Glucose, Bld: 102 mg/dL — ABNORMAL HIGH (ref 70–99)
HCT: 36 % (ref 36.0–46.0)
HEMOGLOBIN: 12.2 g/dL (ref 12.0–15.0)
Potassium: 5.6 mEq/L — ABNORMAL HIGH (ref 3.7–5.3)
SODIUM: 136 meq/L — AB (ref 137–147)
TCO2: 27 mmol/L (ref 0–100)

## 2014-05-01 LAB — PRO B NATRIURETIC PEPTIDE: PRO B NATRI PEPTIDE: 32817 pg/mL — AB (ref 0–125)

## 2014-05-01 LAB — CBC
HEMATOCRIT: 34.3 % — AB (ref 36.0–46.0)
Hemoglobin: 10.9 g/dL — ABNORMAL LOW (ref 12.0–15.0)
MCH: 33.5 pg (ref 26.0–34.0)
MCHC: 31.8 g/dL (ref 30.0–36.0)
MCV: 105.5 fL — AB (ref 78.0–100.0)
PLATELETS: 263 10*3/uL (ref 150–400)
RBC: 3.25 MIL/uL — ABNORMAL LOW (ref 3.87–5.11)
RDW: 13.6 % (ref 11.5–15.5)
WBC: 7.7 10*3/uL (ref 4.0–10.5)

## 2014-05-01 LAB — T-HELPER CELLS (CD4) COUNT (NOT AT ARMC)
CD4 % Helper T Cell: 36 % (ref 33–55)
CD4 T CELL ABS: 1010 /uL (ref 400–2700)

## 2014-05-01 NOTE — ED Notes (Signed)
Pt. Says she would rather be dead then continue living the way she is now. Sleeping on a couch with a big hole in it. Can't walk. She has to fall of the couch and crawl to wheelchair. She wants to run away but can't because she is on HD.

## 2014-05-01 NOTE — Progress Notes (Signed)
This CM called the patient on the phone to inform her that the Medicaid transportation has been set up for the [facility] appointment on 05/13/14. This CM was disconnected during the phone conversation. So called back and left a VM message with the information that she will be picked up by transportation between 12:10 and 12:50. Ferdinand CavaAndrea Schettino, RN, BSN, Case Managers 05/01/2014 1:49 PM

## 2014-05-01 NOTE — ED Notes (Signed)
Pt c/o chest tightness that has been going on since 5pm today. Pt also reports rt hip pain, states she would like to have and x ray done of her hip because she is unable to bear weight on it. Pt denies any falls or injuries to hip. Pt denies any cp at this time, but states she has pelvic pain 10/10.

## 2014-05-01 NOTE — ED Notes (Signed)
EMS called to house. 12 lead unremarkable. 324mg  aspirin given. Chest cold x 2 weeks. HD Pt. T-R-sat.  Lung sounds clear. Went yesterday and everything was fine. Did not take meds until 4pm today. [place]. Pt. Is having to sleep on a couch at relatives house. She has so many meds and gets confused sometimes which ones to take. She can not stand up in front of stove long enough to cook for herself. EMS states this is third time they have found her in this condition.

## 2014-05-01 NOTE — ED Notes (Signed)
Pt reports history of panic attacks, states she believes that's what caused her cp.

## 2014-05-01 NOTE — ED Notes (Signed)
Patient transported to X-ray 

## 2014-05-02 ENCOUNTER — Emergency Department (HOSPITAL_COMMUNITY): Payer: Medicare Other

## 2014-05-02 DIAGNOSIS — R079 Chest pain, unspecified: Secondary | ICD-10-CM | POA: Diagnosis not present

## 2014-05-02 LAB — BASIC METABOLIC PANEL
Anion gap: 20 — ABNORMAL HIGH (ref 5–15)
BUN: 57 mg/dL — ABNORMAL HIGH (ref 6–23)
CO2: 25 mEq/L (ref 19–32)
Calcium: 9.2 mg/dL (ref 8.4–10.5)
Chloride: 95 mEq/L — ABNORMAL LOW (ref 96–112)
Creatinine, Ser: 10.11 mg/dL — ABNORMAL HIGH (ref 0.50–1.10)
GFR, EST AFRICAN AMERICAN: 4 mL/min — AB (ref >90)
GFR, EST NON AFRICAN AMERICAN: 4 mL/min — AB (ref >90)
GLUCOSE: 97 mg/dL (ref 70–99)
POTASSIUM: 6 meq/L — AB (ref 3.7–5.3)
Sodium: 140 mEq/L (ref 137–147)

## 2014-05-02 LAB — I-STAT TROPONIN, ED: Troponin i, poc: 0.03 ng/mL (ref 0.00–0.08)

## 2014-05-02 LAB — TROPONIN I

## 2014-05-02 MED ORDER — ACETAMINOPHEN 325 MG PO TABS
650.0000 mg | ORAL_TABLET | Freq: Once | ORAL | Status: AC
  Administered 2014-05-02: 650 mg via ORAL
  Filled 2014-05-02: qty 2

## 2014-05-02 MED ORDER — SODIUM POLYSTYRENE SULFONATE 15 GM/60ML PO SUSP
30.0000 g | Freq: Once | ORAL | Status: AC
  Administered 2014-05-02: 30 g via ORAL
  Filled 2014-05-02: qty 120

## 2014-05-02 MED ORDER — HYDRALAZINE HCL 20 MG/ML IJ SOLN: 5.0000 mg | Freq: Once | INTRAMUSCULAR | Status: DC

## 2014-05-02 MED ORDER — CARVEDILOL 3.125 MG PO TABS
3.1250 mg | ORAL_TABLET | Freq: Once | ORAL | Status: AC
  Administered 2014-05-02: 3.125 mg via ORAL
  Filled 2014-05-02: qty 1

## 2014-05-02 MED ORDER — HYDRALAZINE HCL 25 MG PO TABS
25.0000 mg | ORAL_TABLET | Freq: Once | ORAL | Status: AC
  Administered 2014-05-02: 25 mg via ORAL
  Filled 2014-05-02: qty 1

## 2014-05-02 NOTE — ED Notes (Signed)
Contacted Dr. Wilkie AyeHorton in regard to patients dialysis due today at noon, Dr. Wilkie AyeHorton will contact Care Mgmt to make arrangements

## 2014-05-02 NOTE — Progress Notes (Signed)
ED CM met with patient at bedside, patient reports not picking up her walker. Contacted James from [facility] concerning her walker. Rolling Walker delivered to ED. Patient being discharged and transported to Dialysis via [place]. CM will make arrangements to have walker delivered to patient while at dialysis Ctr. No further ED CM needs identified.

## 2014-05-02 NOTE — ED Notes (Signed)
Patient discharged via [facility] to transport to dialysis. Report to [facility] Shawnie Dapper([place].) NAD noted at the time of discharge.

## 2014-05-02 NOTE — Discharge Instructions (Signed)
Hyperkalemia Hyperkalemia is when you have too much potassium in your blood. This can be a life-threatening condition. Potassium is normally removed (excreted) from the body by the kidneys. CAUSES  The potassium level in your body can become too high for the following reasons:  You take in too much potassium. You can do this by:  Using salt substitutes. They contain large amounts of potassium.  Taking potassium supplements from your caregiver. The dose may be too high for you.  Eating foods or taking nutritional products with potassium.  You excrete too little potassium. This can happen if:  Your kidneys are not functioning properly. Kidney (renal) disease is a very common cause of hyperkalemia.  You are taking medicines that lower your excretion of potassium, such as certain diuretic medicines.  You have an adrenal gland disease called Addison's disease.  You have a urinary tract obstruction, such as kidney stones.  You are on treatment to mechanically clean your blood (dialysis) and you skip a treatment.  You release a high amount of potassium from your cells into your blood. You may have a condition that causes potassium to move from your cells to your bloodstream. This can happen with:  Injury to muscles or other tissues. Most potassium is stored in the muscles.  Severe burns or infections.  Acidic blood plasma (acidosis). Acidosis can result from many diseases, such as uncontrolled diabetes. SYMPTOMS  Usually, there are no symptoms unless the potassium is dangerously high or has risen very quickly. Symptoms may include:  Irregular or very slow heartbeat.  Feeling sick to your stomach (nauseous).  Tiredness (fatigue).  Nerve problems such as tingling of the skin, numbness of the hands or feet, weakness, or paralysis. DIAGNOSIS  A simple blood test can measure the amount of potassium in your body. An electrocardiogram test of the heart can also help make the diagnosis.  The heart may beat dangerously fast or slow down and stop beating with severe hyperkalemia.  TREATMENT  Treatment depends on how bad the condition is and on the underlying cause.  If the hyperkalemia is an emergency (causing heart problems or paralysis), many different medicines can be used alone or together to lower the potassium level briefly. This may include an insulin injection even if you are not diabetic. Emergency dialysis may be needed to remove potassium from the body.  If the hyperkalemia is less severe or dangerous, the underlying cause is treated. This can include taking medicines if needed. Your prescription medicines may be changed. You may also need to take a medicine to help your body get rid of potassium. You may need to eat a diet low in potassium. HOME CARE INSTRUCTIONS   Take medicines and supplements as directed by your caregiver.  Do not take any over-the-counter medicines, supplements, natural products, herbs, or vitamins without reviewing them with your caregiver. Certain supplements and natural food products can have high amounts of potassium. Other products (such as ibuprofen) can damage weak kidneys and raise your potassium.  You may be asked to do repeat lab tests. Be sure to follow these directions.  If you have kidney disease, you may need to follow a low potassium diet. SEEK MEDICAL CARE IF:   You notice an irregular or very slow heartbeat.  You feel lightheaded.  You develop weakness that is unusual for you. SEEK IMMEDIATE MEDICAL CARE IF:   You have shortness of breath.  You have chest discomfort.  You pass out (faint). MAKE SURE YOU:   Understand  these instructions.  Will watch your condition.  Will get help right away if you are not doing well or get worse. Document Released: 09/15/2002 Document Revised: 12/18/2011 Document Reviewed: 12/31/2013 [facility] Patient Information 2015 [place], [place]. This information is not intended to replace  advice given to you by your health care provider. Make sure you discuss any questions you have with your health care provider. Upper Respiratory Infection, Adult An upper respiratory infection (URI) is also sometimes known as the common cold. The upper respiratory tract includes the nose, sinuses, throat, trachea, and bronchi. Bronchi are the airways leading to the lungs. Most people improve within 1 week, but symptoms can last up to 2 weeks. A residual cough may last even longer.  CAUSES Many different viruses can infect the tissues lining the upper respiratory tract. The tissues become irritated and inflamed and often become very moist. Mucus production is also common. A cold is contagious. You can easily spread the virus to others by oral contact. This includes kissing, sharing a glass, coughing, or sneezing. Touching your mouth or nose and then touching a surface, which is then touched by another person, can also spread the virus. SYMPTOMS  Symptoms typically develop 1 to 3 days after you come in contact with a cold virus. Symptoms vary from person to person. They may include:  Runny nose.  Sneezing.  Nasal congestion.  Sinus irritation.  Sore throat.  Loss of voice (laryngitis).  Cough.  Fatigue.  Muscle aches.  Loss of appetite.  Headache.  Low-grade fever. DIAGNOSIS  You might diagnose your own cold based on familiar symptoms, since most people get a cold 2 to 3 times a year. Your caregiver can confirm this based on your exam. Most importantly, your caregiver can check that your symptoms are not due to another disease such as strep throat, sinusitis, pneumonia, asthma, or epiglottitis. Blood tests, throat tests, and X-rays are not necessary to diagnose a common cold, but they may sometimes be helpful in excluding other more serious diseases. Your caregiver will decide if any further tests are required. RISKS AND COMPLICATIONS  You may be at risk for a more severe case of the  common cold if you smoke cigarettes, have chronic heart disease (such as heart failure) or lung disease (such as asthma), or if you have a weakened immune system. The very young and very old are also at risk for more serious infections. Bacterial sinusitis, middle ear infections, and bacterial pneumonia can complicate the common cold. The common cold can worsen asthma and chronic obstructive pulmonary disease (COPD). Sometimes, these complications can require emergency medical care and may be life-threatening. PREVENTION  The best way to protect against getting a cold is to practice good hygiene. Avoid oral or hand contact with people with cold symptoms. Wash your hands often if contact occurs. There is no clear evidence that vitamin C, vitamin E, echinacea, or exercise reduces the chance of developing a cold. However, it is always recommended to get plenty of rest and practice good nutrition. TREATMENT  Treatment is directed at relieving symptoms. There is no cure. Antibiotics are not effective, because the infection is caused by a virus, not by bacteria. Treatment may include:  Increased fluid intake. Sports drinks offer valuable electrolytes, sugars, and fluids.  Breathing heated mist or steam (vaporizer or shower).  Eating chicken soup or other clear broths, and maintaining good nutrition.  Getting plenty of rest.  Using gargles or lozenges for comfort.  Controlling fevers with ibuprofen or  acetaminophen as directed by your caregiver.  Increasing usage of your inhaler if you have asthma. Zinc gel and zinc lozenges, taken in the first 24 hours of the common cold, can shorten the duration and lessen the severity of symptoms. Pain medicines may help with fever, muscle aches, and throat pain. A variety of non-prescription medicines are available to treat congestion and runny nose. Your caregiver can make recommendations and may suggest nasal or lung inhalers for other symptoms.  HOME CARE  INSTRUCTIONS   Only take over-the-counter or prescription medicines for pain, discomfort, or fever as directed by your caregiver.  Use a warm mist humidifier or inhale steam from a shower to increase air moisture. This may keep secretions moist and make it easier to breathe.  Drink enough water and fluids to keep your urine clear or pale yellow.  Rest as needed.  Return to work when your temperature has returned to normal or as your caregiver advises. You may need to stay home longer to avoid infecting others. You can also use a face mask and careful hand washing to prevent spread of the virus. SEEK MEDICAL CARE IF:   After the first few days, you feel you are getting worse rather than better.  You need your caregiver's advice about medicines to control symptoms.  You develop chills, worsening shortness of breath, or brown or red sputum. These may be signs of pneumonia.  You develop yellow or brown nasal discharge or pain in the face, especially when you bend forward. These may be signs of sinusitis.  You develop a fever, swollen neck glands, pain with swallowing, or white areas in the back of your throat. These may be signs of strep throat. SEEK IMMEDIATE MEDICAL CARE IF:   You have a fever.  You develop severe or persistent headache, ear pain, sinus pain, or chest pain.  You develop wheezing, a prolonged cough, cough up blood, or have a change in your usual mucus (if you have chronic lung disease).  You develop sore muscles or a stiff neck. Document Released: 03/21/2001 Document Revised: 12/18/2011 Document Reviewed: 12/31/2013 [facility] Patient Information 2015 [place], [place]. This information is not intended to replace advice given to you by your health care provider. Make sure you discuss any questions you have with your health care provider.

## 2014-05-02 NOTE — Progress Notes (Addendum)
ED CM notified ED CSW covering ED concerning patient needs. CSW will meet with patient.

## 2014-05-02 NOTE — ED Notes (Signed)
Patient discharged with instructions via [facility], this was arranged per the social worker. Patient is going to the dialysis center. She is scheduled for dialysis today at noon.

## 2014-05-02 NOTE — ED Provider Notes (Signed)
CSN: [id number]     Arrival date & time 05/01/14  2205 History   First MD Initiated Contact with Patient 05/01/14 2309     Chief Complaint  Patient presents with  . Chest Pain     (Consider location/radiation/quality/duration/timing/severity/associated sxs/prior Treatment) HPI  This is a 63 year old female with a history of hypertension, HIV, end-stage renal disease who dialyzes Tuesday, Thursday, and Saturday, CHF who presents with chest pain. Patient reports she's had 2 weeks of chest tightness that has been ongoing. She reports cough productive of yellow sputum. She states her chest pain got worse today when she began to feel more anxiety. She denies any worsening shortness of breath. She denies any fevers. Patient is also complaining of right leg pain and difficulty angulating the last 8 months. She states that she gets around with a walker. The patient states that she does not like where she is living and is having to sleep on the couch. She says "I do not felt safe." She is unsure whether she is taking her medications today. She last dialyzed on Thursday.  Patient has had multiple ER visits and admissions in July. Last seen several days ago for the same complaints.  Past Medical History  Diagnosis Date  . Hypertension   . HIV infection   . Morbid obesity   . ESRD (end stage renal disease) on dialysis 09/30/2013    Started dialysis in [place], [place] around 2009.  ESRD due to HTN vs drug abuse according to pt.  Was on dialysis at [facility] until Feb 2015 when she was admitted to a SNF due to homelessness and drug abuse.  Then changed to [facility] on TTS schedule.     . Anginal pain   . CHF (congestive heart failure)   . Shortness of breath   . Pneumonia   . Depression   . Anxiety   . GERD (gastroesophageal reflux disease)   . Arthritis   . Anemia    Past Surgical History  Procedure Laterality Date  . Arteriovenous graft placement      left forearm  . Cardiac catheterization     . Laparotomy      states seh was cut open because seh was bleeding on the inside   Family History  Problem Relation Age of Onset  . Kidney failure Other     niece  . High blood pressure    . Lung cancer    . Breast cancer Neg Hx   . Colon cancer Neg Hx   . Stroke Mother   . HIV/AIDS Brother     died of AIDS   History  Substance Use Topics  . Smoking status: Current Every Day Smoker -- 0.50 packs/day    Types: Cigarettes  . Smokeless tobacco: Never Used  . Alcohol Use: No   OB History   Grav Para Term Preterm Abortions TAB SAB Ect Mult Living                 Review of Systems  Constitutional: Negative for fever.  Respiratory: Positive for cough and chest tightness. Negative for shortness of breath.   Cardiovascular: Positive for chest pain. Negative for leg swelling.  Gastrointestinal: Negative for nausea, vomiting and abdominal pain.  Genitourinary: Negative for dysuria.  Musculoskeletal: Negative for back pain.       Right hip pain  Skin: Negative for wound.  Neurological: Negative for headaches.  Psychiatric/Behavioral: Negative for confusion. The patient is nervous/anxious.   All other  systems reviewed and are negative.     Allergies  Tramadol and Morphine and related  Home Medications   Prior to Admission medications   Medication Sig Start Date End Date Taking? Authorizing Provider  amLODipine (NORVASC) 5 MG tablet Take 10 mg by mouth daily.   Yes Historical Provider, MD  aspirin EC 81 MG tablet Take 81 mg by mouth daily.   Yes Historical Provider, MD  carvedilol (COREG) 3.125 MG tablet Take 3.125 mg by mouth 2 (two) times daily with a meal.   Yes Historical Provider, MD  cinacalcet (SENSIPAR) 90 MG tablet Take 90 mg by mouth daily.   Yes Historical Provider, MD  colchicine 0.6 MG tablet Take 0.6 mg by mouth daily.   Yes Historical Provider, MD  diclofenac sodium (VOLTAREN) 1 % GEL Apply 4 g topically 4 (four) times daily as needed (pain).    Yes  Historical Provider, MD  diphenhydrAMINE (BENADRYL) 50 MG capsule Take 50 mg by mouth at bedtime as needed for sleep.   Yes Historical Provider, MD  FLUoxetine (PROZAC) 20 MG capsule Take 20 mg by mouth daily.   Yes Historical Provider, MD  guaiFENesin-dextromethorphan (ROBITUSSIN DM) 100-10 MG/5ML syrup Take 5 mLs by mouth every 4 (four) hours as needed for cough. 04/17/14  Yes Kathlen Mody, MD  hydrALAZINE (APRESOLINE) 25 MG tablet Take 1 tablet (25 mg total) by mouth every 8 (eight) hours. 04/17/14  Yes Kathlen Mody, MD  ipratropium-albuterol (DUONEB) 0.5-2.5 (3) MG/3ML SOLN Take 3 mLs by nebulization every 6 (six) hours as needed. 04/17/14  Yes Kathlen Mody, MD  isosorbide mononitrate (IMDUR) 30 MG 24 hr tablet Take 30 mg by mouth daily.   Yes Historical Provider, MD  lamiVUDine (EPIVIR) 10 MG/ML solution Take 50 mg by mouth daily.   Yes Historical Provider, MD  lamoTRIgine (LAMICTAL) 25 MG tablet Take 25 mg by mouth daily.   Yes Historical Provider, MD  LORazepam (ATIVAN) 0.5 MG tablet Take 0.5 mg by mouth every 8 (eight) hours as needed for anxiety.   Yes Historical Provider, MD  multivitamin (RENA-VIT) TABS tablet Take 1 tablet by mouth daily.   Yes Historical Provider, MD  omeprazole (PRILOSEC) 20 MG capsule Take 20 mg by mouth daily.   Yes Historical Provider, MD  polyethylene glycol (MIRALAX / GLYCOLAX) packet Take 17 g by mouth daily as needed for mild constipation.    Yes Historical Provider, MD  raltegravir (ISENTRESS) 400 MG tablet Take 400 mg by mouth 2 (two) times daily.    Yes Historical Provider, MD  senna-docusate (SENOKOT-S) 8.6-50 MG per tablet Take 2 tablets by mouth daily.   Yes Historical Provider, MD  sevelamer carbonate (RENVELA) 800 MG tablet Take 1,600-2,400 mg by mouth 3 (three) times daily with meals. *takes 2400mg  with meals and 1600mg  with snacks*   Yes Historical Provider, MD  tenofovir (VIREAD) 300 MG tablet Take 300 mg by mouth every Friday. Takes on Fridays.   Yes  Historical Provider, MD   BP 195/107  Pulse 99  Temp(Src) 98.1 F (36.7 C) (Oral)  Resp 18  SpO2 100% Physical Exam  Nursing note and vitals reviewed. Constitutional: She is oriented to person, place, and time.  Obese, chronically ill-appearing  HENT:  Head: Normocephalic and atraumatic.  Mouth/Throat: Oropharynx is clear and moist.  Eyes: Pupils are equal, round, and reactive to light.  Neck: Neck supple.  Cardiovascular: Normal rate, regular rhythm and normal heart sounds.   No murmur heard. Pulmonary/Chest: Effort normal and breath sounds  normal. No respiratory distress. She has no wheezes.  Final crackles at the bases  Abdominal: Soft. Bowel sounds are normal.  Musculoskeletal: She exhibits edema.  Normal range of motion of the right hip and right knee  Neurological: She is alert and oriented to person, place, and time.  5 out of 5 strength in all 4 extremities  Skin: Skin is warm and dry.  Psychiatric: She has a normal mood and affect.    ED Course  Procedures (including critical care time) Labs Review Labs Reviewed  CBC - Abnormal; Notable for the following:    RBC 3.25 (*)    Hemoglobin 10.9 (*)    HCT 34.3 (*)    MCV 105.5 (*)    All other components within normal limits  PRO B NATRIURETIC PEPTIDE - Abnormal; Notable for the following:    Pro B Natriuretic peptide (BNP) 32817.0 (*)    All other components within normal limits  BASIC METABOLIC PANEL - Abnormal; Notable for the following:    Potassium 6.0 (*)    Chloride 95 (*)    BUN 57 (*)    Creatinine, Ser 10.11 (*)    GFR calc non Af Amer 4 (*)    GFR calc Af Amer 4 (*)    Anion gap 20 (*)    All other components within normal limits  I-STAT CHEM 8, ED - Abnormal; Notable for the following:    Sodium 136 (*)    Potassium 5.6 (*)    BUN 54 (*)    Creatinine, Ser 11.50 (*)    Glucose, Bld 102 (*)    Calcium, Ion 0.99 (*)    All other components within normal limits  TROPONIN I  I-STAT TROPOININ,  ED    Imaging Review Dg Chest 2 View  05/02/2014   CLINICAL DATA:  Cough, congestion and shortness of breath. Chest pain.  EXAM: CHEST  2 VIEW  COMPARISON:  Chest radiograph performed 04/30/2014  FINDINGS: The lungs are well-aerated. Vascular congestion is noted. Increased interstitial markings raise concern for mild residual pulmonary edema. No pleural effusion or pneumothorax is seen.  The heart is borderline normal in size. There appears to be gradually increasing prominence of the superior mediastinum, on comparison with multiple studies over the past few years. This could reflect thyroid goiter; would consider CT of the chest for further evaluation, on an elective nonemergent basis. No acute osseous abnormalities are seen.  IMPRESSION: 1. Vascular congestion noted. Increased interstitial markings raise concern for mild residual pulmonary edema. 2. Gradually increasing prominence of the superior mediastinum, over the past few years. This could reflect thyroid goiter. Would consider CT of the chest for further evaluation, on an elective nonemergent basis.   Electronically Signed   By: Roanna Raider M.D.   On: 05/02/2014 00:50   Dg Pelvis 1-2 Views  05/02/2014   CLINICAL DATA:  Right hip pain.  No known injury.  EXAM: PELVIS - 1-2 VIEW  COMPARISON:  Prior CT from 01/17/2014  FINDINGS: No acute fracture or dislocation. Femoral heads are normally aligned within the acetabula. From old head heights are preserved.  Moderate degenerative osteoarthrosis as evidenced by joint space narrowing, subchondral sclerosis, and juxta-articular osteophytosis seen about both hips, right worse than left.  Bony pelvis is intact. SI joints are approximated. No pubic dye stasis.  No soft tissue abnormality.  IMPRESSION: 1. 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 2. No acute abnormality within the pelvis.   Electronically  Signed   By: Rise MuBenjamin  McClintock M.D.   On: 05/02/2014  00:48     EKG Interpretation   Date/Time:  Friday May 01 2014 22:15:43 EDT Ventricular Rate:  107 PR Interval:  170 QRS Duration: 109 QT Interval:  364 QTC Calculation: 486 R Axis:   50 Text Interpretation:  Sinus tachycardia Borderline prolonged QT interval  No significant change since last tracing Confirmed by Denton LankSTEINL  MD, Caryn BeeKEVIN  ([id number]) on 05/01/2014 11:01:24 PM      MDM   Final diagnoses:  Chest pain, unspecified chest pain type  URI (upper respiratory infection)  Hyperkalemia    Patient presents with chest tightness. Tightness is been ongoing for several weeks. She reports associated cough. She is chronically ill-appearing but nontoxic on exam. Vital signs are stable and she is satting 100% on room air. Last dialyzed on Thursday. Basic labwork was obtained. EKG is reassuring a chest x-ray shows evidence of mild residual pulmonary edema.  She's currently in no respiratory distress and satting 100% on room air.  BMP notable for potassium of 6.0 without EKG changes. She has an anion gap of 20 which is close to her baseline. Patient was given Kayexalate. He'll patient's mild volume overload and hyperkalemia likely secondary for need for dialysis which she is scheduled for at noon on Saturday.  Hip films show degenerative osteoarthritis of the hips bilaterally which is likely contributing to the patient's pain.  At this time, I feel patient would benefit from her scheduled dialysis at noon on Saturday.  Chest pain workup is unremarkable at this time and given the chronicity of her symptoms come have low suspicion for ACS. Given the patient is not safe at home, will hold overnight and have social work and case management to in the morning.  9:20 AM Spoke with Burna MortimerWanda, case management. She is social work will discuss housing with the patient. Patient and social work understanding the patient is to be discharged to dialysis for her noon session.   Shon Batonourtney F Jaheim Canino, MD 05/02/14 [phone]

## 2014-05-02 NOTE — Clinical Social Work Note (Signed)
CSW made aware patient stated she does not feel safe in her home, as she lives with niece. CSW met with patient who stated she does not feel "unsafe" in the home with her niece, but she has to sleep on a "bad" couch in which she is paying niece $400 a month to sleep on. Patient further stated niece does not help to obtain medication by either picking it up or taking her to get it. Per patient, she is corrdinated with [facility], and has attempted to contact [employer] for [employer] patients, but is never able to get through to anyone. Patient reported she feels "tricked" into staying with niece and often has to get up and walk to fix her food and it becomes difficult once she has slept on the couch, as her knees continuously hurt from getting up from the couch. CSW encouraged patient to use [employer] to take her to location to speak with a [employer] representative, and obtain medication. CSW further discussed with patient, contacting [employer] within the appropriate time frame in order to arrange transportation. Patient stated she rather be "dead" than return to live with niece, but denied having an active plan to harm herself.  Patient stated she would just "stop taking my medicine" and "not go to dialysis." CSW offered patient list of homeless shelters, patient declined. CSW additionally discussed importance of maintaining dialysis and medication compliance for physical health. Patient further informed CSW of wheelchair she was unable to receive on Monday as she was d/c prior to receiving it. CSW informed RNCM. CSW discussed d/c transportation with patient. CSW informed patient of [facility] arrangement. Per patient, she wanted to d/c via [facility]. CSW contacted [employer] who stated patient was not listed for pick-up. CSW made patient aware. Patient was upset and refusing to d/c to dialysis center via [place]. RNCM along with CSW spoke with patient regarding importance of receiving dialysis. Patient then agreed with [facility] and is agreeable to d/c to  dialysis center via [place]. No further needs. CSW signing off.  Bernard, [place] Weekend Clinical Social Worker [phone]

## 2014-05-02 NOTE — ED Notes (Signed)
Case Manager down to see patient

## 2014-05-05 ENCOUNTER — Encounter (HOSPITAL_COMMUNITY): Payer: Self-pay | Admitting: Emergency Medicine

## 2014-05-05 ENCOUNTER — Emergency Department (HOSPITAL_COMMUNITY): Payer: Medicare Other

## 2014-05-05 ENCOUNTER — Emergency Department (HOSPITAL_COMMUNITY)
Admission: EM | Admit: 2014-05-05 | Discharge: 2014-05-05 | Disposition: A | Discharge status: Home / Self Care | Payer: Medicare Other | Source: Home / Self Care | Attending: Emergency Medicine | Admitting: Emergency Medicine

## 2014-05-05 DIAGNOSIS — K219 Gastro-esophageal reflux disease without esophagitis: Secondary | ICD-10-CM | POA: Insufficient documentation

## 2014-05-05 DIAGNOSIS — D649 Anemia, unspecified: Secondary | ICD-10-CM | POA: Insufficient documentation

## 2014-05-05 DIAGNOSIS — M25561 Pain in right knee: Secondary | ICD-10-CM

## 2014-05-05 DIAGNOSIS — M25569 Pain in unspecified knee: Secondary | ICD-10-CM | POA: Insufficient documentation

## 2014-05-05 DIAGNOSIS — N186 End stage renal disease: Secondary | ICD-10-CM

## 2014-05-05 DIAGNOSIS — M79609 Pain in unspecified limb: Secondary | ICD-10-CM | POA: Insufficient documentation

## 2014-05-05 DIAGNOSIS — Z7982 Long term (current) use of aspirin: Secondary | ICD-10-CM | POA: Insufficient documentation

## 2014-05-05 DIAGNOSIS — F17200 Nicotine dependence, unspecified, uncomplicated: Secondary | ICD-10-CM

## 2014-05-05 DIAGNOSIS — M1710 Unilateral primary osteoarthritis, unspecified knee: Secondary | ICD-10-CM | POA: Insufficient documentation

## 2014-05-05 DIAGNOSIS — I509 Heart failure, unspecified: Secondary | ICD-10-CM

## 2014-05-05 DIAGNOSIS — I209 Angina pectoris, unspecified: Secondary | ICD-10-CM

## 2014-05-05 DIAGNOSIS — Z79899 Other long term (current) drug therapy: Secondary | ICD-10-CM

## 2014-05-05 DIAGNOSIS — F329 Major depressive disorder, single episode, unspecified: Secondary | ICD-10-CM | POA: Insufficient documentation

## 2014-05-05 DIAGNOSIS — I120 Hypertensive chronic kidney disease with stage 5 chronic kidney disease or end stage renal disease: Secondary | ICD-10-CM

## 2014-05-05 DIAGNOSIS — Z992 Dependence on renal dialysis: Secondary | ICD-10-CM | POA: Insufficient documentation

## 2014-05-05 DIAGNOSIS — F3289 Other specified depressive episodes: Secondary | ICD-10-CM | POA: Insufficient documentation

## 2014-05-05 DIAGNOSIS — M1711 Unilateral primary osteoarthritis, right knee: Secondary | ICD-10-CM

## 2014-05-05 DIAGNOSIS — F411 Generalized anxiety disorder: Secondary | ICD-10-CM | POA: Insufficient documentation

## 2014-05-05 DIAGNOSIS — Z21 Asymptomatic human immunodeficiency virus [HIV] infection status: Secondary | ICD-10-CM | POA: Insufficient documentation

## 2014-05-05 DIAGNOSIS — Z8701 Personal history of pneumonia (recurrent): Secondary | ICD-10-CM

## 2014-05-05 DIAGNOSIS — R0602 Shortness of breath: Secondary | ICD-10-CM | POA: Diagnosis not present

## 2014-05-05 DIAGNOSIS — I5043 Acute on chronic combined systolic (congestive) and diastolic (congestive) heart failure: Secondary | ICD-10-CM | POA: Diagnosis not present

## 2014-05-05 LAB — BASIC METABOLIC PANEL
ANION GAP: 22 — AB (ref 5–15)
BUN: 62 mg/dL — ABNORMAL HIGH (ref 6–23)
CHLORIDE: 94 meq/L — AB (ref 96–112)
CO2: 25 meq/L (ref 19–32)
CREATININE: 11.53 mg/dL — AB (ref 0.50–1.10)
Calcium: 8.9 mg/dL (ref 8.4–10.5)
GFR calc non Af Amer: 3 mL/min — ABNORMAL LOW (ref >90)
GFR, EST AFRICAN AMERICAN: 4 mL/min — AB (ref >90)
Glucose, Bld: 180 mg/dL — ABNORMAL HIGH (ref 70–99)
Potassium: 5.1 mEq/L (ref 3.7–5.3)
SODIUM: 141 meq/L (ref 137–147)

## 2014-05-05 LAB — CBC WITH DIFFERENTIAL/PLATELET
BASOS ABS: 0 10*3/uL (ref 0.0–0.1)
BASOS PCT: 0 % (ref 0–1)
Eosinophils Absolute: 0.1 10*3/uL (ref 0.0–0.7)
Eosinophils Relative: 1 % (ref 0–5)
HEMATOCRIT: 33.1 % — AB (ref 36.0–46.0)
Hemoglobin: 10.7 g/dL — ABNORMAL LOW (ref 12.0–15.0)
Lymphocytes Relative: 39 % (ref 12–46)
Lymphs Abs: 3.1 10*3/uL (ref 0.7–4.0)
MCH: 34.9 pg — ABNORMAL HIGH (ref 26.0–34.0)
MCHC: 32.3 g/dL (ref 30.0–36.0)
MCV: 107.8 fL — ABNORMAL HIGH (ref 78.0–100.0)
Monocytes Absolute: 0.7 10*3/uL (ref 0.1–1.0)
Monocytes Relative: 9 % (ref 3–12)
NEUTROS ABS: 4.1 10*3/uL (ref 1.7–7.7)
Neutrophils Relative %: 51 % (ref 43–77)
Platelets: 244 10*3/uL (ref 150–400)
RBC: 3.07 MIL/uL — ABNORMAL LOW (ref 3.87–5.11)
RDW: 14.2 % (ref 11.5–15.5)
WBC: 7.9 10*3/uL (ref 4.0–10.5)

## 2014-05-05 LAB — SEDIMENTATION RATE: Sed Rate: 58 mm/hr — ABNORMAL HIGH (ref 0–22)

## 2014-05-05 MED ORDER — FENTANYL CITRATE 0.05 MG/ML IJ SOLN
50.0000 ug | INTRAMUSCULAR | Status: DC | PRN
  Administered 2014-05-05 (×2): 50 ug via INTRAVENOUS
  Filled 2014-05-05 (×2): qty 2

## 2014-05-05 NOTE — ED Notes (Signed)
Pt eating breakfast 

## 2014-05-05 NOTE — ED Notes (Signed)
Consent complete for knee centesis. Supplies at bedside.

## 2014-05-05 NOTE — Progress Notes (Signed)
  CARE MANAGEMENT ED NOTE 05/05/2014  Patient:  [name],[name]   Account Number:  [id number]  Date Initiated:  05/05/2014  Documentation initiated by:  Ferdinand CavaSCHETTINO,Jhamal Plucinski  Subjective/Objective Assessment:   63 yo female presenting to the ED with c/o right knee pain     Subjective/Objective Assessment Detail:     Action/Plan:   [facility] upon discharge and will follow up with Dr. Concepcion ElkAvbuere to establish care   Action/Plan Detail:   Anticipated DC Date:       Status Recommendation to Physician:   Result of Recommendation:      DC Planning Services  CM consult  Follow-up appt scheduled  Other  PCP issues    Choice offered to / List presented to:  C-1 Patient     HH arranged  HH-1 RN  HH-4 NURSE'S AIDE  HH-6 SOCIAL WORKER      HH [facility]    Status of service:  Completed, signed off  ED Comments:   ED Comments Detail:  This Cm spoke with the patient and she stated that she wants to be placed in a facility. This Cm explained to the patient that we cannot place her in a facility from the ED and the patient verbalized understanding. This CM was able to set up an appointment for the patient with Dr. Concepcion ElkAvbuere on 05/20/14 at 2 pm to establish care with a PCP. The patient was provided the information. Also discussed HH care with the patient for services in her home and to assist with facility placement if appropriate. The patient agrred with plan and [facility], aide, and SW. This CM then called and spoke with the RN Del[facility] and also the SW [place]. Discussed the patient care and request. Sabrina [facility] stated that she cannot assist with placement from the dialysis center but she did state that she will meet with the patient today to discuss services. This CM also infomred the [facility] that the patient will be transported to the kidney center via PT[facility] once discharged. The  patient was made aware of the plan and agreed and the EDP was updated.

## 2014-05-05 NOTE — ED Notes (Signed)
PTAR at bedside 

## 2014-05-05 NOTE — ED Notes (Signed)
Pt back from x-ray.

## 2014-05-05 NOTE — ED Notes (Signed)
Pt requested feet to be cleaned.  Nurse cleaned feet, dried and lotion applied.

## 2014-05-05 NOTE — ED Notes (Signed)
Care transferred, report received Robin W, RN. 

## 2014-05-05 NOTE — ED Notes (Signed)
MD at bedside.Dr Zavitz 

## 2014-05-05 NOTE — Discharge Instructions (Signed)
Arthritis, Nonspecific °Arthritis is inflammation of a joint. This usually means pain, redness, warmth or swelling are present. One or more joints may be involved. There are a number of types of arthritis. Your caregiver may not be able to tell what type of arthritis you have right away. °CAUSES  °The most common cause of arthritis is the wear and tear on the joint (osteoarthritis). This causes damage to the cartilage, which can break down over time. The knees, hips, back and neck are most often affected by this type of arthritis. °Other types of arthritis and common causes of joint pain include: °· Sprains and other injuries near the joint. Sometimes minor sprains and injuries cause pain and swelling that develop hours later. °· Rheumatoid arthritis. This affects hands, feet and knees. It usually affects both sides of your body at the same time. It is often associated with chronic ailments, fever, weight loss and general weakness. °· Crystal arthritis. Gout and pseudo gout can cause occasional acute severe pain, redness and swelling in the foot, ankle, or knee. °· Infectious arthritis. Bacteria can get into a joint through a break in overlying skin. This can cause infection of the joint. Bacteria and viruses can also spread through the blood and affect your joints. °· Drug, infectious and allergy reactions. Sometimes joints can become mildly painful and slightly swollen with these types of illnesses. °SYMPTOMS  °· Pain is the main symptom. °· Your joint or joints can also be red, swollen and warm or hot to the touch. °· You may have a fever with certain types of arthritis, or even feel overall ill. °· The joint with arthritis will hurt with movement. Stiffness is present with some types of arthritis. °DIAGNOSIS  °Your caregiver will suspect arthritis based on your description of your symptoms and on your exam. Testing may be needed to find the type of arthritis: °· Blood and sometimes urine tests. °· X-ray tests  and sometimes CT or MRI scans. °· Removal of fluid from the joint (arthrocentesis) is done to check for bacteria, crystals or other causes. Your caregiver (or a specialist) will numb the area over the joint with a local anesthetic, and use a needle to remove joint fluid for examination. This procedure is only minimally uncomfortable. °· Even with these tests, your caregiver may not be able to tell what kind of arthritis you have. Consultation with a specialist (rheumatologist) may be helpful. °TREATMENT  °Your caregiver will discuss with you treatment specific to your type of arthritis. If the specific type cannot be determined, then the following general recommendations may apply. °Treatment of severe joint pain includes: °· Rest. °· Elevation. °· Anti-inflammatory medication (for example, ibuprofen) may be prescribed. Avoiding activities that cause increased pain. °· Only take over-the-counter or prescription medicines for pain and discomfort as recommended by your caregiver. °· Cold packs over an inflamed joint may be used for 10 to 15 minutes every hour. Hot packs sometimes feel better, but do not use overnight. Do not use hot packs if you are diabetic without your caregiver's permission. °· A cortisone shot into arthritic joints may help reduce pain and swelling. °· Any acute arthritis that gets worse over the next 1 to 2 days needs to be looked at to be sure there is no joint infection. °Long-term arthritis treatment involves modifying activities and lifestyle to reduce joint stress jarring. This can include weight loss. Also, exercise is needed to nourish the joint cartilage and remove waste. This helps keep the muscles   around the joint strong. HOME CARE INSTRUCTIONS   Do not take aspirin to relieve pain if gout is suspected. This elevates uric acid levels.  Only take over-the-counter or prescription medicines for pain, discomfort or fever as directed by your caregiver.  Rest the joint as much as  possible.  If your joint is swollen, keep it elevated.  Use crutches if the painful joint is in your leg.  Drinking plenty of fluids may help for certain types of arthritis.  Follow your caregiver's dietary instructions.  Try low-impact exercise such as:  Swimming.  Water aerobics.  Biking.  Walking.  Morning stiffness is often relieved by a warm shower.  Put your joints through regular range-of-motion. SEEK MEDICAL CARE IF:   You do not feel better in 24 hours or are getting worse.  You have side effects to medications, or are not getting better with treatment. SEEK IMMEDIATE MEDICAL CARE IF:   You have a fever.  You develop severe joint pain, swelling or redness.  Many joints are involved and become painful and swollen.  There is severe back pain and/or leg weakness.  You have loss of bowel or bladder control. Document Released: 11/02/2004 Document Revised: 12/18/2011 Document Reviewed: 11/18/2008 [facility] Patient Information 2015 [place], [place]. This information is not intended to replace advice given to you by your health care provider. Make sure you discuss any questions you have with your health care provider.  Chronic Kidney Disease Chronic kidney disease occurs when the kidneys are damaged over a long period. The kidneys are two organs that lie on either side of the spine between the middle of the back and the front of the abdomen. The kidneys:   Remove wastes and extra water from the blood.   Produce important hormones. These help keep bones strong, regulate blood pressure, and help create red blood cells.   Balance the fluids and chemicals in the blood and tissues. A small amount of kidney damage may not cause problems, but a large amount of damage may make it difficult or impossible for the kidneys to work the way they should. If steps are not taken to slow down the kidney damage or stop it from getting worse, the kidneys may stop working permanently.  Most of the time, chronic kidney disease does not go away. However, it can often be controlled, and those with the disease can usually live normal lives. CAUSES  The most common causes of chronic kidney disease are diabetes and high blood pressure (hypertension). Chronic kidney disease may also be caused by:   Diseases that cause the kidneys' filters to become inflamed.   Diseases that affect the immune system.   Genetic diseases.   Medicines that damage the kidneys, such as anti-inflammatory medicines.  Poisoning or exposure to toxic substances.   A reoccurring kidney or urinary infection.   A problem with urine flow. This may be caused by:   Cancer.   Kidney stones.   An enlarged prostate in males. SIGNS AND SYMPTOMS  Because the kidney damage in chronic kidney disease occurs slowly, symptoms develop slowly and may not be obvious until the kidney damage becomes severe. A person may have a kidney disease for years without showing any symptoms. Symptoms can include:   Swelling (edema) of the legs, ankles, or feet.   Tiredness (lethargy).   Nausea or vomiting.   Confusion.   Problems with urination, such as:   Decreased urine production.   Frequent urination, especially at night.   Frequent  accidents in children who are potty trained.   Muscle twitches and cramps.   Shortness of breath.  Weakness.   Persistent itchiness.   Loss of appetite.  Metallic taste in the mouth.  Trouble sleeping.  Slowed development in children.  Short stature in children. DIAGNOSIS  Chronic kidney disease may be detected and diagnosed by tests, including blood, urine, imaging, or kidney biopsy tests.  TREATMENT  Most chronic kidney diseases cannot be cured. Treatment usually involves relieving symptoms and preventing or slowing the progression of the disease. Treatment may include:   A special diet. You may need to avoid alcohol and foods thatare salty  and high in potassium.   Medicines. These may:   Lower blood pressure.   Relieve anemia.   Relieve swelling.   Protect the bones. HOME CARE INSTRUCTIONS   Follow your prescribed diet.   Take medicines only as directed by your health care provider. Do not take any new medicines (prescription, over-the-counter, or nutritional supplements) unless approved by your health care provider. Many medicines can worsen your kidney damage or need to have the dose adjusted.   Quit smoking if you smoke. Talk to your health care provider about a smoking cessation program.   Keep all follow-up visits as directed by your health care provider. SEEK IMMEDIATE MEDICAL CARE IF:  Your symptoms get worse or you develop new symptoms.   You develop symptoms of end-stage kidney disease. These include:   Headaches.   Abnormally dark or light skin.   Numbness in the hands or feet.   Easy bruising.   Frequent hiccups.   Menstruation stops.   You have a fever.   You have decreased urine production.   You havepain or bleeding when urinating. MAKE SURE YOU:  Understand these instructions.  Will watch your condition.  Will get help right away if you are not doing well or get worse. FOR MORE INFORMATION   American Association of Kidney Patients: ResidentialShow.is  National Kidney Foundation: www.kidney.org  American Kidney Fund: FightingMatch.com.ee  Life Options Rehabilitation Program: www.lifeoptions.org and www.kidneyschool.org Document Released: 07/04/2008 Document Revised: 02/09/2014 Document Reviewed: 05/24/2012 [employer] Patient Information 2015 [place], [place]. This information is not intended to replace advice given to you by your health care provider. Make sure you discuss any questions you have with your health care provider.

## 2014-05-05 NOTE — ED Provider Notes (Signed)
CSN: [id number]     Arrival date & time 05/05/14  0046 History   First MD Initiated Contact with Patient 05/05/14 [phone]     Chief Complaint  Patient presents with  . Leg Pain     (Consider location/radiation/quality/duration/timing/severity/associated sxs/prior Treatment) HPI Comments: 63 year old female with history of end-stage renal disease analysis, HIV, high blood pressure, obesity, anemia presents with right knee pain and swelling worsening for the past few weeks. Patient has had intermittent worsening pain in her knees from arthritis the past year. 2 years ago she had fluid drained of the right knee and no history of septic joint. Pain with walking and range of motion. No new knee injuries, fevers or chills. Patient has not take her medicines regularly as she is not sure which ones to take. Patient has not had a stable place to live and feels she needs further assistance or placement into assisted living.  Patient is a 63 y.o. female presenting with leg pain. The history is provided by the patient.  Leg Pain Associated symptoms: no back pain, no fever and no neck pain     Past Medical History  Diagnosis Date  . Hypertension   . HIV infection   . Morbid obesity   . ESRD (end stage renal disease) on dialysis 09/30/2013    Started dialysis in [place], [place] around 2009.  ESRD due to HTN vs drug abuse according to pt.  Was on dialysis at [facility] until Feb 2015 when she was admitted to a SNF due to homelessness and drug abuse.  Then changed to [facility] on TTS schedule.     . Anginal pain   . CHF (congestive heart failure)   . Shortness of breath   . Pneumonia   . Depression   . Anxiety   . GERD (gastroesophageal reflux disease)   . Arthritis   . Anemia    Past Surgical History  Procedure Laterality Date  . Arteriovenous graft placement      left forearm  . Cardiac catheterization    . Laparotomy      states seh was cut open because seh was bleeding on the inside    Family History  Problem Relation Age of Onset  . Kidney failure Other     niece  . High blood pressure    . Lung cancer    . Breast cancer Neg Hx   . Colon cancer Neg Hx   . Stroke Mother   . HIV/AIDS Brother     died of AIDS   History  Substance Use Topics  . Smoking status: Current Every Day Smoker -- 0.50 packs/day    Types: Cigarettes  . Smokeless tobacco: Never Used  . Alcohol Use: No   OB History   Grav Para Term Preterm Abortions TAB SAB Ect Mult Living                 Review of Systems  Constitutional: Negative for fever and chills.  HENT: Negative for congestion.   Eyes: Negative for visual disturbance.  Respiratory: Negative for shortness of breath.   Cardiovascular: Negative for chest pain.  Gastrointestinal: Negative for vomiting and abdominal pain.  Genitourinary: Negative for dysuria and flank pain.  Musculoskeletal: Positive for arthralgias and joint swelling. Negative for back pain, neck pain and neck stiffness.  Skin: Negative for rash.  Neurological: Negative for light-headedness and headaches.      Allergies  Tramadol and Morphine and related  Home Medications  Prior to Admission medications   Medication Sig Start Date End Date Taking? Authorizing Provider  lamiVUDine (EPIVIR) 10 MG/ML solution Take 50 mg by mouth daily.   Yes Historical Provider, MD  raltegravir (ISENTRESS) 400 MG tablet Take 400 mg by mouth 2 (two) times daily.    Yes Historical Provider, MD  tenofovir (VIREAD) 300 MG tablet Take 300 mg by mouth every Friday. Takes on Fridays.   Yes Historical Provider, MD  amLODipine (NORVASC) 5 MG tablet Take 10 mg by mouth daily.    Historical Provider, MD  aspirin EC 81 MG tablet Take 81 mg by mouth daily.    Historical Provider, MD  carvedilol (COREG) 3.125 MG tablet Take 3.125 mg by mouth 2 (two) times daily with a meal.    Historical Provider, MD  cinacalcet (SENSIPAR) 90 MG tablet Take 90 mg by mouth daily.    Historical Provider,  MD  colchicine 0.6 MG tablet Take 0.6 mg by mouth daily.    Historical Provider, MD  diclofenac sodium (VOLTAREN) 1 % GEL Apply 4 g topically 4 (four) times daily as needed (pain).     Historical Provider, MD  diphenhydrAMINE (BENADRYL) 50 MG capsule Take 50 mg by mouth at bedtime as needed for sleep.    Historical Provider, MD  FLUoxetine (PROZAC) 20 MG capsule Take 20 mg by mouth daily.    Historical Provider, MD  guaiFENesin-dextromethorphan (ROBITUSSIN DM) 100-10 MG/5ML syrup Take 5 mLs by mouth every 4 (four) hours as needed for cough. 04/17/14   Kathlen Mody, MD  hydrALAZINE (APRESOLINE) 25 MG tablet Take 1 tablet (25 mg total) by mouth every 8 (eight) hours. 04/17/14   Kathlen Mody, MD  ipratropium-albuterol (DUONEB) 0.5-2.5 (3) MG/3ML SOLN Take 3 mLs by nebulization every 6 (six) hours as needed. 04/17/14   Kathlen Mody, MD  isosorbide mononitrate (IMDUR) 30 MG 24 hr tablet Take 30 mg by mouth daily.    Historical Provider, MD  lamoTRIgine (LAMICTAL) 25 MG tablet Take 25 mg by mouth daily.    Historical Provider, MD  LORazepam (ATIVAN) 0.5 MG tablet Take 0.5 mg by mouth every 8 (eight) hours as needed for anxiety.    Historical Provider, MD  multivitamin (RENA-VIT) TABS tablet Take 1 tablet by mouth daily.    Historical Provider, MD  omeprazole (PRILOSEC) 20 MG capsule Take 20 mg by mouth daily.    Historical Provider, MD  polyethylene glycol (MIRALAX / GLYCOLAX) packet Take 17 g by mouth daily as needed for mild constipation.     Historical Provider, MD  senna-docusate (SENOKOT-S) 8.6-50 MG per tablet Take 2 tablets by mouth daily.    Historical Provider, MD  sevelamer carbonate (RENVELA) 800 MG tablet Take 1,600-2,400 mg by mouth 3 (three) times daily with meals. *takes 2400mg  with meals and 1600mg  with snacks*    Historical Provider, MD   BP 167/85  Pulse 98  Temp(Src) 98.3 F (36.8 C) (Oral)  Resp 17  Ht 5\' 4"  (1.626 m)  Wt 250 lb (113.399 kg)  BMI 42.89 kg/m2  SpO2 99% Physical  Exam  Nursing note and vitals reviewed. Constitutional: She is oriented to person, place, and time. She appears well-developed and well-nourished.  HENT:  Head: Normocephalic and atraumatic.  Eyes: Conjunctivae are normal. Right eye exhibits no discharge. Left eye exhibits no discharge.  Neck: Normal range of motion. Neck supple. No tracheal deviation present.  Cardiovascular: Normal rate and regular rhythm.   Pulmonary/Chest: Effort normal and breath sounds normal.  Abdominal: Soft.  She exhibits no distension. There is no tenderness. There is no guarding.  Musculoskeletal: She exhibits edema and tenderness.  Patient with moderate tenderness right knee decreased range of motion due to pain. Mild swelling. Neurovascular intact distal knee, no warmth or erythema.  Neurological: She is alert and oriented to person, place, and time.  Skin: Skin is warm. No rash noted.  Psychiatric: She has a normal mood and affect.    ED Course  Procedures (including critical care time) Right knee arthrocentesis Indication pain and swelling to look for infection Right lateral upper knee prepped with Betadine after risks and benefits/consent obtained. 8 cc lidocaine injected prior to using 20-gauge spinal needle and 2 attempts to obtain fluid. No significant fluid obtained. Bandage placed on knee, no complications. Labs Review Labs Reviewed  SEDIMENTATION RATE - Abnormal; Notable for the following:    Sed Rate 58 (*)    All other components within normal limits  CBC WITH DIFFERENTIAL - Abnormal; Notable for the following:    RBC 3.07 (*)    Hemoglobin 10.7 (*)    HCT 33.1 (*)    MCV 107.8 (*)    MCH 34.9 (*)    All other components within normal limits  BASIC METABOLIC PANEL - Abnormal; Notable for the following:    Chloride 94 (*)    Glucose, Bld 180 (*)    BUN 62 (*)    Creatinine, Ser 11.53 (*)    GFR calc non Af Amer 3 (*)    GFR calc Af Amer 4 (*)    Anion gap 22 (*)    All other components  within normal limits    Imaging Review Dg Knee 2 Views Right  05/05/2014   CLINICAL DATA:  Progressive knee pain.  EXAM: RIGHT KNEE - 1-2 VIEW  COMPARISON:  07/31/2013  FINDINGS: The patient has severe arthritis of the lateral compartment of the right knee with cortical erosions and joint space narrowing and marginal osteophyte formation. There is less severe patellofemoral and medial compartment osteoarthritis. There is only a tiny joint effusion.  IMPRESSION: No acute abnormality. Progressive severe osteoarthritis the lateral compartment.   Electronically Signed   By: Geanie CooleyJim  Maxwell M.D.   On: 05/05/2014 02:29     EKG Interpretation None      MDM   Final diagnoses:  Arthritis of knee, right  Right knee pain  ESRD (end stage renal disease)   Discussed likely severe arthritis as pain, x-ray confirms arthritis. Discussed with patient with worsening symptoms we could attempt to obtain fluid from the right knee to look for signs of infection. Discussed risks and benefits with the patient and patient agrees to proceed if she's had this 2 years ago. Right knee arthritis centesis done no significant fluid obtained with 2 attempts. Low suspicion for septic joint. Unable to obtain social work consult in the night however we'll moved upon CT and have patient evaluated to help arrange better living situation/home health nurse/other to help with her care.  Patient will be signed out to followup social work recommendation to assist patient with home care or possible placement. End-stage renal disease, severe right knee arthritis, right knee pain   Enid SkeensJoshua M Jeanluc Wegman, MD 05/05/14 [phone]

## 2014-05-05 NOTE — ED Notes (Signed)
Pt reports pain and swelling in bilateral knees L>R that got worse last night. Pt sts she can't do anything for herself d't pain in legs and hips and needs to be placed in nursing facility. Pt is dialysis (tues, thurs, sat). Pt htn with ems 202/110. Pt belongings at bedside. Pt conscious a&ox4.

## 2014-05-05 NOTE — ED Notes (Signed)
MD at bedside to preform R knee arthrocentesis. Consent signed.

## 2014-05-05 NOTE — ED Notes (Signed)
PTAR has left with patient and paperwork.

## 2014-05-05 NOTE — ED Notes (Signed)
Pt sts that she has not been taking home medications because  She doesn't know what to take and also that she has been out of her HIV medications. sts no one will help her at home and she can not care for herself.

## 2014-05-06 LAB — HLA B*5701: HLA B 5701: NEGATIVE

## 2014-05-06 NOTE — Progress Notes (Signed)
This CM received a phone call from ED CSW stating that the patient is calling her asking for placement in a facility. This CM called the patient and she verbalized the same thing. This CM explained to the patient again that she cannot get placed from the ED and that she needs to stay home so [facility] to see her today. This CM explained to the patient that they can assist her with medical and other resources in the community and assist with placement if appropriate. The patient verbalized understanding and was [facility] with [facility] [phone]. The patient repeated back the contact information and then she stated that she would contact them now. No other questions or concerns. Ferdinand CavaAndrea Schettino, RN, BSN, Case Manager [phone] 05/06/2014 2:26 PM

## 2014-05-07 ENCOUNTER — Emergency Department (HOSPITAL_COMMUNITY): Payer: Medicare Other

## 2014-05-07 ENCOUNTER — Encounter (HOSPITAL_COMMUNITY): Payer: Self-pay | Admitting: Emergency Medicine

## 2014-05-07 ENCOUNTER — Emergency Department (HOSPITAL_COMMUNITY)
Admission: EM | Admit: 2014-05-07 | Discharge: 2014-05-07 | Disposition: A | Discharge status: Home / Self Care | Payer: Medicare Other | Attending: Emergency Medicine | Admitting: Emergency Medicine

## 2014-05-07 DIAGNOSIS — R062 Wheezing: Secondary | ICD-10-CM | POA: Diagnosis not present

## 2014-05-07 DIAGNOSIS — Z7982 Long term (current) use of aspirin: Secondary | ICD-10-CM | POA: Diagnosis not present

## 2014-05-07 DIAGNOSIS — F329 Major depressive disorder, single episode, unspecified: Secondary | ICD-10-CM | POA: Diagnosis not present

## 2014-05-07 DIAGNOSIS — R05 Cough: Secondary | ICD-10-CM | POA: Diagnosis not present

## 2014-05-07 DIAGNOSIS — M25469 Effusion, unspecified knee: Secondary | ICD-10-CM | POA: Diagnosis not present

## 2014-05-07 DIAGNOSIS — M25562 Pain in left knee: Secondary | ICD-10-CM

## 2014-05-07 DIAGNOSIS — N186 End stage renal disease: Secondary | ICD-10-CM | POA: Insufficient documentation

## 2014-05-07 DIAGNOSIS — Z79899 Other long term (current) drug therapy: Secondary | ICD-10-CM | POA: Insufficient documentation

## 2014-05-07 DIAGNOSIS — R059 Cough, unspecified: Secondary | ICD-10-CM | POA: Diagnosis not present

## 2014-05-07 DIAGNOSIS — I509 Heart failure, unspecified: Secondary | ICD-10-CM | POA: Diagnosis not present

## 2014-05-07 DIAGNOSIS — M25559 Pain in unspecified hip: Secondary | ICD-10-CM | POA: Insufficient documentation

## 2014-05-07 DIAGNOSIS — I209 Angina pectoris, unspecified: Secondary | ICD-10-CM | POA: Diagnosis not present

## 2014-05-07 DIAGNOSIS — R5381 Other malaise: Secondary | ICD-10-CM | POA: Insufficient documentation

## 2014-05-07 DIAGNOSIS — M25569 Pain in unspecified knee: Secondary | ICD-10-CM | POA: Diagnosis present

## 2014-05-07 DIAGNOSIS — Z862 Personal history of diseases of the blood and blood-forming organs and certain disorders involving the immune mechanism: Secondary | ICD-10-CM | POA: Insufficient documentation

## 2014-05-07 DIAGNOSIS — G8929 Other chronic pain: Secondary | ICD-10-CM | POA: Diagnosis not present

## 2014-05-07 DIAGNOSIS — Z992 Dependence on renal dialysis: Secondary | ICD-10-CM | POA: Diagnosis not present

## 2014-05-07 DIAGNOSIS — Z8701 Personal history of pneumonia (recurrent): Secondary | ICD-10-CM | POA: Diagnosis not present

## 2014-05-07 DIAGNOSIS — M25552 Pain in left hip: Secondary | ICD-10-CM

## 2014-05-07 DIAGNOSIS — R0789 Other chest pain: Secondary | ICD-10-CM | POA: Insufficient documentation

## 2014-05-07 DIAGNOSIS — M129 Arthropathy, unspecified: Secondary | ICD-10-CM | POA: Diagnosis not present

## 2014-05-07 DIAGNOSIS — I120 Hypertensive chronic kidney disease with stage 5 chronic kidney disease or end stage renal disease: Secondary | ICD-10-CM | POA: Insufficient documentation

## 2014-05-07 DIAGNOSIS — R5383 Other fatigue: Secondary | ICD-10-CM

## 2014-05-07 DIAGNOSIS — K219 Gastro-esophageal reflux disease without esophagitis: Secondary | ICD-10-CM | POA: Insufficient documentation

## 2014-05-07 DIAGNOSIS — F3289 Other specified depressive episodes: Secondary | ICD-10-CM | POA: Diagnosis not present

## 2014-05-07 DIAGNOSIS — F17200 Nicotine dependence, unspecified, uncomplicated: Secondary | ICD-10-CM | POA: Insufficient documentation

## 2014-05-07 DIAGNOSIS — M25551 Pain in right hip: Secondary | ICD-10-CM

## 2014-05-07 DIAGNOSIS — Z21 Asymptomatic human immunodeficiency virus [HIV] infection status: Secondary | ICD-10-CM | POA: Diagnosis not present

## 2014-05-07 DIAGNOSIS — M549 Dorsalgia, unspecified: Secondary | ICD-10-CM | POA: Diagnosis not present

## 2014-05-07 DIAGNOSIS — Z9889 Other specified postprocedural states: Secondary | ICD-10-CM | POA: Insufficient documentation

## 2014-05-07 DIAGNOSIS — F411 Generalized anxiety disorder: Secondary | ICD-10-CM | POA: Diagnosis not present

## 2014-05-07 DIAGNOSIS — M25561 Pain in right knee: Secondary | ICD-10-CM

## 2014-05-07 LAB — CBC WITH DIFFERENTIAL/PLATELET
BASOS PCT: 0 % (ref 0–1)
Basophils Absolute: 0 10*3/uL (ref 0.0–0.1)
Eosinophils Absolute: 0.1 10*3/uL (ref 0.0–0.7)
Eosinophils Relative: 2 % (ref 0–5)
HEMATOCRIT: 32.9 % — AB (ref 36.0–46.0)
HEMOGLOBIN: 10.4 g/dL — AB (ref 12.0–15.0)
LYMPHS PCT: 38 % (ref 12–46)
Lymphs Abs: 2.7 10*3/uL (ref 0.7–4.0)
MCH: 33.4 pg (ref 26.0–34.0)
MCHC: 31.6 g/dL (ref 30.0–36.0)
MCV: 105.8 fL — ABNORMAL HIGH (ref 78.0–100.0)
MONO ABS: 0.8 10*3/uL (ref 0.1–1.0)
Monocytes Relative: 11 % (ref 3–12)
NEUTROS ABS: 3.5 10*3/uL (ref 1.7–7.7)
NEUTROS PCT: 49 % (ref 43–77)
Platelets: 236 10*3/uL (ref 150–400)
RBC: 3.11 MIL/uL — ABNORMAL LOW (ref 3.87–5.11)
RDW: 14.2 % (ref 11.5–15.5)
WBC: 7.2 10*3/uL (ref 4.0–10.5)

## 2014-05-07 LAB — BASIC METABOLIC PANEL
ANION GAP: 21 — AB (ref 5–15)
BUN: 53 mg/dL — ABNORMAL HIGH (ref 6–23)
CHLORIDE: 98 meq/L (ref 96–112)
CO2: 24 meq/L (ref 19–32)
Calcium: 9.3 mg/dL (ref 8.4–10.5)
Creatinine, Ser: 11.41 mg/dL — ABNORMAL HIGH (ref 0.50–1.10)
GFR calc non Af Amer: 3 mL/min — ABNORMAL LOW (ref >90)
GFR, EST AFRICAN AMERICAN: 4 mL/min — AB (ref >90)
Glucose, Bld: 93 mg/dL (ref 70–99)
POTASSIUM: 5.7 meq/L — AB (ref 3.7–5.3)
Sodium: 143 mEq/L (ref 137–147)

## 2014-05-07 MED ORDER — OXYCODONE-ACETAMINOPHEN 5-325 MG PO TABS
1.0000 | ORAL_TABLET | Freq: Once | ORAL | Status: AC
  Administered 2014-05-07: 1 via ORAL
  Filled 2014-05-07: qty 1

## 2014-05-07 NOTE — ED Provider Notes (Signed)
CSN: [id number]     Arrival date & time 05/07/14  1020 History   First MD Initiated Contact with Patient 05/07/14 1108     Chief Complaint  Patient presents with  . Knee Pain     (Consider location/radiation/quality/duration/timing/severity/associated sxs/prior Treatment) HPI  63 yo female with hx of ESRD on HD, HIV, HTN, CHF, arthritis, chronic leg/hip pain, and occasional anginal pain is here with worsening of her chronic bilateral knee pain. Pain is constant on both knees, with associated whole leg pain bilaterally. She also has hip pain that's chronic R > L. She had some sharp chest pain in the morning when she was feeling anxious but it resolved. Missed her dialysis today because of the pain and also lack of support. She lives with people who are drug addicts per patient. She doesn't have any meds and doesn't have anyone to help her. She is unable to walk or move by herself because of the pain. She hasn't taken any pain meds. Used to buy percocet off the street but ran out of money. Doesn't see a doctor here, used to see someone at [facility]. CSW talked to her before about home health nursing but she wasn't able to get any help.  Denies any cp/sob/n/v/diarrhea/constipation, fever,chills, headache. Denies numbness/ tingling/ weakness. Unable to move lower extremities because of pain.  Was here at the ED few days ago for the same complaint. Attempted arthrocentesis showed no fluid on the knee.  Past Medical History  Diagnosis Date  . Hypertension   . HIV infection   . Morbid obesity   . ESRD (end stage renal disease) on dialysis 09/30/2013    Started dialysis in [place], [place] around 2009.  ESRD due to HTN vs drug abuse according to pt.  Was on dialysis at [facility] until Feb 2015 when she was admitted to a SNF due to homelessness and drug abuse.  Then changed to [facility] on TTS schedule.     . Anginal pain   . CHF (congestive heart failure)   . Shortness of breath   . Pneumonia   .  Depression   . Anxiety   . GERD (gastroesophageal reflux disease)   . Arthritis   . Anemia    Past Surgical History  Procedure Laterality Date  . Arteriovenous graft placement      left forearm  . Cardiac catheterization    . Laparotomy      states seh was cut open because seh was bleeding on the inside   Family History  Problem Relation Age of Onset  . Kidney failure Other     niece  . High blood pressure    . Lung cancer    . Breast cancer Neg Hx   . Colon cancer Neg Hx   . Stroke Mother   . HIV/AIDS Brother     died of AIDS   History  Substance Use Topics  . Smoking status: Current Every Day Smoker -- 0.50 packs/day    Types: Cigarettes  . Smokeless tobacco: Never Used  . Alcohol Use: No   OB History   Grav Para Term Preterm Abortions TAB SAB Ect Mult Living                 Review of Systems  Constitutional: Positive for fatigue. Negative for fever, chills, diaphoresis, activity change and appetite change.  HENT: Negative for congestion, drooling, facial swelling and mouth sores.   Eyes: Negative.   Respiratory: Positive for cough, chest  tightness and wheezing. Negative for choking and shortness of breath.   Cardiovascular: Positive for chest pain and leg swelling. Negative for palpitations.  Gastrointestinal: Negative.   Endocrine: Negative.   Genitourinary: Negative.   Musculoskeletal: Positive for arthralgias, back pain, gait problem, joint swelling and myalgias. Negative for neck pain and neck stiffness.  Skin: Negative.   Allergic/Immunologic: Negative.   Neurological: Negative.   Hematological: Negative.   Psychiatric/Behavioral: The patient is nervous/anxious.       Allergies  Tramadol and Morphine and related  Home Medications   Prior to Admission medications   Medication Sig Start Date End Date Taking? Authorizing Provider  amLODipine (NORVASC) 5 MG tablet Take 10 mg by mouth daily.   Yes Historical Provider, MD  aspirin EC 81 MG tablet  Take 81 mg by mouth daily.   Yes Historical Provider, MD  carvedilol (COREG) 3.125 MG tablet Take 3.125 mg by mouth 2 (two) times daily with a meal.   Yes Historical Provider, MD  cinacalcet (SENSIPAR) 90 MG tablet Take 90 mg by mouth daily.   Yes Historical Provider, MD  colchicine 0.6 MG tablet Take 0.6 mg by mouth daily.   Yes Historical Provider, MD  diclofenac sodium (VOLTAREN) 1 % GEL Apply 4 g topically 4 (four) times daily as needed (pain).    Yes Historical Provider, MD  diphenhydrAMINE (BENADRYL) 50 MG capsule Take 50 mg by mouth at bedtime as needed for sleep.   Yes Historical Provider, MD  FLUoxetine (PROZAC) 20 MG capsule Take 20 mg by mouth daily.   Yes Historical Provider, MD  guaiFENesin-dextromethorphan (ROBITUSSIN DM) 100-10 MG/5ML syrup Take 5 mLs by mouth every 4 (four) hours as needed for cough. 04/17/14  Yes Kathlen ModyVijaya Akula, MD  hydrALAZINE (APRESOLINE) 25 MG tablet Take 1 tablet (25 mg total) by mouth every 8 (eight) hours. 04/17/14  Yes Kathlen ModyVijaya Akula, MD  ipratropium-albuterol (DUONEB) 0.5-2.5 (3) MG/3ML SOLN Take 3 mLs by nebulization every 6 (six) hours as needed. 04/17/14  Yes Kathlen ModyVijaya Akula, MD  isosorbide mononitrate (IMDUR) 30 MG 24 hr tablet Take 30 mg by mouth daily.   Yes Historical Provider, MD  lamiVUDine (EPIVIR) 10 MG/ML solution Take 50 mg by mouth daily.   Yes Historical Provider, MD  lamoTRIgine (LAMICTAL) 25 MG tablet Take 25 mg by mouth daily.   Yes Historical Provider, MD  LORazepam (ATIVAN) 0.5 MG tablet Take 0.5 mg by mouth every 8 (eight) hours as needed for anxiety.   Yes Historical Provider, MD  multivitamin (RENA-VIT) TABS tablet Take 1 tablet by mouth daily.   Yes Historical Provider, MD  omeprazole (PRILOSEC) 20 MG capsule Take 20 mg by mouth daily.   Yes Historical Provider, MD  polyethylene glycol (MIRALAX / GLYCOLAX) packet Take 17 g by mouth daily as needed for mild constipation.    Yes Historical Provider, MD  raltegravir (ISENTRESS) 400 MG tablet Take  400 mg by mouth 2 (two) times daily.    Yes Historical Provider, MD  senna-docusate (SENOKOT-S) 8.6-50 MG per tablet Take 2 tablets by mouth daily.   Yes Historical Provider, MD  sevelamer carbonate (RENVELA) 800 MG tablet Take 1,600-2,400 mg by mouth 3 (three) times daily with meals. *takes 2400mg  with meals and 1600mg  with snacks*   Yes Historical Provider, MD  tenofovir (VIREAD) 300 MG tablet Take 300 mg by mouth every Friday. Takes on Fridays.   Yes Historical Provider, MD   BP 145/83  Pulse 96  Temp(Src) 98.6 F (37 C) (Oral)  Resp  13  SpO2 93% Physical Exam  Constitutional: She is oriented to person, place, and time. She appears well-developed and well-nourished. She has a sickly appearance. She appears distressed.  HENT:  Head: Normocephalic and atraumatic.  Right Ear: External ear normal.  Left Ear: External ear normal.  Nose: Nose normal.  Mouth/Throat: Oropharynx is clear and moist.  Eyes: Conjunctivae and EOM are normal. Pupils are equal, round, and reactive to light. Right eye exhibits no discharge. Left eye exhibits no discharge. No scleral icterus.  Neck: Normal range of motion. Neck supple. No JVD present. No thyromegaly present.  Cardiovascular: Normal rate, regular rhythm, S1 normal, S2 normal, normal heart sounds and intact distal pulses.  Exam reveals no gallop and no friction rub.   No murmur heard. Pulmonary/Chest: Effort normal and breath sounds normal. No respiratory distress. She has no wheezes. She has no rales. She exhibits no tenderness.  Abdominal: Soft. Bowel sounds are normal. She exhibits no distension and no mass. There is no tenderness. There is no rebound, no guarding and no CVA tenderness.  Musculoskeletal: She exhibits no edema.       Right hip: She exhibits decreased range of motion and tenderness.       Left hip: She exhibits decreased range of motion and tenderness.       Right knee: She exhibits decreased range of motion, swelling and bony  tenderness. She exhibits no deformity and no erythema. Tenderness found. Patellar tendon tenderness noted.       Left knee: She exhibits decreased range of motion and swelling. She exhibits no laceration and no erythema.  Lymphadenopathy:    She has no cervical adenopathy.  Neurological: She is alert and oriented to person, place, and time. She has normal reflexes. No cranial nerve deficit or sensory deficit. GCS eye subscore is 4. GCS verbal subscore is 5. GCS motor subscore is 6.  Strength 4/5 due to pain on both legs.  Skin: No rash noted. She is not diaphoretic. No erythema. No pallor.  Psychiatric: She has a normal mood and affect. Her behavior is normal.    ED Course  Procedures (including critical care time) Labs Review Labs Reviewed  CBC WITH DIFFERENTIAL - Abnormal; Notable for the following:    RBC 3.11 (*)    Hemoglobin 10.4 (*)    HCT 32.9 (*)    MCV 105.8 (*)    All other components within normal limits  BASIC METABOLIC PANEL - Abnormal; Notable for the following:    Potassium 5.7 (*)    BUN 53 (*)    Creatinine, Ser 11.41 (*)    GFR calc non Af Amer 3 (*)    GFR calc Af Amer 4 (*)    Anion gap 21 (*)    All other components within normal limits    Imaging Review Dg Chest Portable 1 View  05/07/2014   CLINICAL DATA:  Cough; dialysis dependent renal failure  EXAM: PORTABLE CHEST - 1 VIEW  COMPARISON:  PA and lateral chest of May 02, 2014  FINDINGS: The lungs are adequately inflated. The interstitial markings are increased in the pulmonary vascularity is engorged. There is stable scarring in the mid lungs bilaterally. The cardiac silhouette is mildly enlarged. There is no pleural effusion.  IMPRESSION: Pulmonary interstitial edema of cardiac or noncardiac cause. There is no pneumonia.   Electronically Signed   By: David  [place]   On: 05/07/2014 13:46     EKG Interpretation   Date/Time:  Thursday May 07 2014 11:47:51 EDT Ventricular Rate:  96 PR Interval:  180 QRS  Duration: 102 QT Interval:  373 QTC Calculation: 471 R Axis:   23 Text Interpretation:  Sinus rhythm Abnormal R-wave progression, early  transition No significant change since last tracing Confirmed by KNAPP   MD-J, JON ([id number]) on 05/07/2014 11:54:34 AM        EKG mostly normal. CXR doesn't show any pleural effusion. Has some increased interstitial markings.  CXR, CBC and BMP doesn't suggest any urgent hemodialysis indication.  HD on tues, Thursday, sat.   MDM   Final diagnoses:  Chronic knee pain, left  Chronic knee pain, right  Hip pain, bilateral     Patient is well known to the case worker team. They have worked with during her past frequent ED visits to arrange transportation to the dialysis. They also found her ID clinic appt at [facility] for her HIV treatment. She also has PCP appt that case worker set up with Dr. Concepcion Elk in 1-2 weeks. Patient used to live with her niece who kicked her out because she refused to continue to pay her. Patient says she has no where to go. Case worker talked to daughter who lives in [place] who has no car. She said patient can stay there if needed. However, her HD is arranged at [facility].   We have arranged HD for 12 tomorrow at [facility] where she was supposed to get HD today.  Hyacinth Meeker, MD 05/07/14 1453

## 2014-05-07 NOTE — ED Notes (Addendum)
Pt arrived by [facility] for chronic pain to her legs and knees, has arthritis. Reports having no help at home and needing placement in assisted living. isnt taking her meds, has no pain meds at home, went to dialysis yesterday and is due today.

## 2014-05-07 NOTE — ED Provider Notes (Signed)
Medical screening examination/treatment/procedure(s) were conducted as a shared visit with non-physician practitioner(s) and myself.  I personally evaluated the patient during the encounter.   EKG Interpretation   Date/Time:  Thursday April 30 2014 08:12:51 EDT Ventricular Rate:  101 PR Interval:  186 QRS Duration: 100 QT Interval:  376 QTC Calculation: 487 R Axis:   58 Text Interpretation:  Sinus tachycardia Borderline prolonged QT interval  ED PHYSICIAN INTERPRETATION AVAILABLE IN [facility] Confirmed by  TEST, Record ([id number]) on 05/02/2014 7:10:47 AM      Pt presents w/ L sided chest pain only w/ cough as well as chills, Denies fever. She has poor social situation since moving here and has had multiple missed HD treatments and admits noncompliance w/ meds. CXR improved from last. Pt needs HD, though not emergently. Plan discussed with nephrology and pt will be sent from ED to HD. SW also involved in pt care.   Shanna CiscoMegan E Fate Galanti, MD 05/07/14 1919

## 2014-05-07 NOTE — ED Notes (Signed)
Pulled pt up in bed pt requesting coffee. Gave pt coffee

## 2014-05-07 NOTE — ED Notes (Signed)
CALLED PTR AND SPOKE TO RICK. HE ADVISES HE HAS PT ON HIS LIST FOR TRANSPORT. HE ADVISES MAY BE 45 MINUTES OR LONGER BEFORE THEY CAN PICK UP PATIENT

## 2014-05-07 NOTE — ED Notes (Signed)
Pt given turkey sandwich and milk.

## 2014-05-07 NOTE — ED Notes (Signed)
Pt reports that she has not had a bath in numerous days because she cannot bathe herself and has no one at home to help, this RN helped to bathe the pt. Full linen change and peri care done. Pt resting.

## 2014-05-07 NOTE — Progress Notes (Signed)
  CARE MANAGEMENT ED NOTE 05/07/2014  Patient:  Jeralene HuffLSTON,Ceira ANN   Account Number:  [id number]  Date Initiated:  05/07/2014  Documentation initiated by:  Ferdinand CavaSCHETTINO,Roneisha Stern  Subjective/Objective Assessment:   63 yo female presenting to the ED with c/o bilateral leg pain     Subjective/Objective Assessment Detail:     Action/Plan:   Patient will be discharged home via [facility] transportation services   Action/Plan Detail:   Anticipated DC Date:       Status Recommendation to Physician:   Result of Recommendation:  Agreed    DC Planning Services  CM consult  Other    Choice offered to / List presented to:            Status of service:  Completed, signed off  ED Comments:   ED Comments Detail:  CM consulted to assist with community resources. This CM spoke with the patient and she stated that she cannot go back home and has no where else to live. This CM called Doris CSW, and communicated the patient's situation,  Tyler AasDoris stated that due to the patient's scheduled dialysis appointments the SW department cannot assist with alternative living arrangements in the community including the homeless shelter, especially [place] dialysis center appointments. This CM discussed this matter with the patient and explained that she will have to return back to her niece's until she is able to arrange transportation [place]. Also explained to the patient that she needs to communicate to her dialysis center that [place] so [place] can [place] treatments. The patient verbalized understanding. The EDP was updated with the information obtained and that upon discharge the patient can be transported by [facility] to her niece's residence and the patient has to coordinate a change in her living environment. The patient verbalized understanding. No other questions or concerns  at this time.

## 2014-05-07 NOTE — Discharge Instructions (Signed)
We have [facility] dialysis [facility] for tomorrow at 12 PM. Please go there tomorrow to get your dialysis.

## 2014-05-07 NOTE — ED Notes (Signed)
PATIENT ON CALL BELL. SHE IS REQUESTING THAT HER LEGS BE MOVED FOR HER AND FOR A "SNACK" BECAUSE SHE HAS NOT EATEN ALL DAY. PT THEN REMEMBERED SHE GOT A [place] SANDWICH AND APPLESAUCE AND MILK. STATES SHE WOULD LIKE SOME COOKIES OR SOMETHING ELSE. PT MADE AWARE THAT A TRAY HAS BEEN ORDERED. PT ALSO UPDATED THAT [facility] IS DELAYED

## 2014-05-08 ENCOUNTER — Encounter (HOSPITAL_COMMUNITY): Payer: Self-pay | Admitting: Emergency Medicine

## 2014-05-08 ENCOUNTER — Inpatient Hospital Stay (HOSPITAL_COMMUNITY)
Admission: EM | Admit: 2014-05-08 | Discharge: 2014-05-12 | DRG: 291 | Disposition: A | Discharge status: Skilled Nursing Facility | Payer: Medicare Other | Attending: Internal Medicine | Admitting: Internal Medicine

## 2014-05-08 ENCOUNTER — Emergency Department (HOSPITAL_COMMUNITY): Payer: Medicare Other

## 2014-05-08 DIAGNOSIS — M949 Disorder of cartilage, unspecified: Secondary | ICD-10-CM

## 2014-05-08 DIAGNOSIS — M1990 Unspecified osteoarthritis, unspecified site: Secondary | ICD-10-CM | POA: Diagnosis present

## 2014-05-08 DIAGNOSIS — R0989 Other specified symptoms and signs involving the circulatory and respiratory systems: Secondary | ICD-10-CM

## 2014-05-08 DIAGNOSIS — Z21 Asymptomatic human immunodeficiency virus [HIV] infection status: Secondary | ICD-10-CM

## 2014-05-08 DIAGNOSIS — K219 Gastro-esophageal reflux disease without esophagitis: Secondary | ICD-10-CM | POA: Diagnosis present

## 2014-05-08 DIAGNOSIS — F1210 Cannabis abuse, uncomplicated: Secondary | ICD-10-CM | POA: Diagnosis present

## 2014-05-08 DIAGNOSIS — Z91199 Patient's noncompliance with other medical treatment and regimen due to unspecified reason: Secondary | ICD-10-CM

## 2014-05-08 DIAGNOSIS — I509 Heart failure, unspecified: Secondary | ICD-10-CM

## 2014-05-08 DIAGNOSIS — Z992 Dependence on renal dialysis: Secondary | ICD-10-CM

## 2014-05-08 DIAGNOSIS — I120 Hypertensive chronic kidney disease with stage 5 chronic kidney disease or end stage renal disease: Secondary | ICD-10-CM | POA: Diagnosis present

## 2014-05-08 DIAGNOSIS — N039 Chronic nephritic syndrome with unspecified morphologic changes: Secondary | ICD-10-CM

## 2014-05-08 DIAGNOSIS — D631 Anemia in chronic kidney disease: Secondary | ICD-10-CM | POA: Diagnosis present

## 2014-05-08 DIAGNOSIS — M25569 Pain in unspecified knee: Secondary | ICD-10-CM | POA: Diagnosis present

## 2014-05-08 DIAGNOSIS — F1490 Cocaine use, unspecified, uncomplicated: Secondary | ICD-10-CM | POA: Diagnosis present

## 2014-05-08 DIAGNOSIS — Z6841 Body Mass Index (BMI) 40.0 and over, adult: Secondary | ICD-10-CM

## 2014-05-08 DIAGNOSIS — Z91158 Patient's noncompliance with renal dialysis for other reason: Secondary | ICD-10-CM

## 2014-05-08 DIAGNOSIS — Z9119 Patient's noncompliance with other medical treatment and regimen: Secondary | ICD-10-CM

## 2014-05-08 DIAGNOSIS — J811 Chronic pulmonary edema: Secondary | ICD-10-CM | POA: Diagnosis present

## 2014-05-08 DIAGNOSIS — Z7982 Long term (current) use of aspirin: Secondary | ICD-10-CM

## 2014-05-08 DIAGNOSIS — R0603 Acute respiratory distress: Secondary | ICD-10-CM

## 2014-05-08 DIAGNOSIS — Z823 Family history of stroke: Secondary | ICD-10-CM

## 2014-05-08 DIAGNOSIS — Z9115 Patient's noncompliance with renal dialysis: Secondary | ICD-10-CM

## 2014-05-08 DIAGNOSIS — I5043 Acute on chronic combined systolic (congestive) and diastolic (congestive) heart failure: Principal | ICD-10-CM

## 2014-05-08 DIAGNOSIS — E875 Hyperkalemia: Secondary | ICD-10-CM | POA: Diagnosis present

## 2014-05-08 DIAGNOSIS — R0600 Dyspnea, unspecified: Secondary | ICD-10-CM | POA: Diagnosis present

## 2014-05-08 DIAGNOSIS — J9601 Acute respiratory failure with hypoxia: Secondary | ICD-10-CM

## 2014-05-08 DIAGNOSIS — J9690 Respiratory failure, unspecified, unspecified whether with hypoxia or hypercapnia: Secondary | ICD-10-CM

## 2014-05-08 DIAGNOSIS — J9600 Acute respiratory failure, unspecified whether with hypoxia or hypercapnia: Secondary | ICD-10-CM

## 2014-05-08 DIAGNOSIS — F1410 Cocaine abuse, uncomplicated: Secondary | ICD-10-CM

## 2014-05-08 DIAGNOSIS — Z5900 Homelessness unspecified: Secondary | ICD-10-CM

## 2014-05-08 DIAGNOSIS — F17200 Nicotine dependence, unspecified, uncomplicated: Secondary | ICD-10-CM | POA: Diagnosis present

## 2014-05-08 DIAGNOSIS — M899 Disorder of bone, unspecified: Secondary | ICD-10-CM | POA: Diagnosis present

## 2014-05-08 DIAGNOSIS — N186 End stage renal disease: Secondary | ICD-10-CM

## 2014-05-08 DIAGNOSIS — I10 Essential (primary) hypertension: Secondary | ICD-10-CM

## 2014-05-08 DIAGNOSIS — R0609 Other forms of dyspnea: Secondary | ICD-10-CM

## 2014-05-08 DIAGNOSIS — B20 Human immunodeficiency virus [HIV] disease: Secondary | ICD-10-CM

## 2014-05-08 DIAGNOSIS — K5900 Constipation, unspecified: Secondary | ICD-10-CM | POA: Diagnosis not present

## 2014-05-08 DIAGNOSIS — Z885 Allergy status to narcotic agent status: Secondary | ICD-10-CM

## 2014-05-08 DIAGNOSIS — J810 Acute pulmonary edema: Secondary | ICD-10-CM

## 2014-05-08 DIAGNOSIS — I4891 Unspecified atrial fibrillation: Secondary | ICD-10-CM | POA: Diagnosis present

## 2014-05-08 DIAGNOSIS — R5381 Other malaise: Secondary | ICD-10-CM | POA: Diagnosis present

## 2014-05-08 DIAGNOSIS — F411 Generalized anxiety disorder: Secondary | ICD-10-CM

## 2014-05-08 DIAGNOSIS — Z888 Allergy status to other drugs, medicaments and biological substances status: Secondary | ICD-10-CM

## 2014-05-08 DIAGNOSIS — N189 Chronic kidney disease, unspecified: Secondary | ICD-10-CM

## 2014-05-08 DIAGNOSIS — N2581 Secondary hyperparathyroidism of renal origin: Secondary | ICD-10-CM | POA: Diagnosis present

## 2014-05-08 LAB — CBC
HEMATOCRIT: 39.5 % (ref 36.0–46.0)
Hemoglobin: 12.7 g/dL (ref 12.0–15.0)
MCH: 34.5 pg — ABNORMAL HIGH (ref 26.0–34.0)
MCHC: 32.2 g/dL (ref 30.0–36.0)
MCV: 107.3 fL — ABNORMAL HIGH (ref 78.0–100.0)
Platelets: 273 10*3/uL (ref 150–400)
RBC: 3.68 MIL/uL — ABNORMAL LOW (ref 3.87–5.11)
RDW: 14.1 % (ref 11.5–15.5)
WBC: 16.3 10*3/uL — ABNORMAL HIGH (ref 4.0–10.5)

## 2014-05-08 LAB — BASIC METABOLIC PANEL
Anion gap: 23 — ABNORMAL HIGH (ref 5–15)
BUN: 69 mg/dL — ABNORMAL HIGH (ref 6–23)
CHLORIDE: 93 meq/L — AB (ref 96–112)
CO2: 21 meq/L (ref 19–32)
Calcium: 9.4 mg/dL (ref 8.4–10.5)
Creatinine, Ser: 12.44 mg/dL — ABNORMAL HIGH (ref 0.50–1.10)
GFR calc Af Amer: 3 mL/min — ABNORMAL LOW (ref >90)
GFR calc non Af Amer: 3 mL/min — ABNORMAL LOW (ref >90)
Glucose, Bld: 112 mg/dL — ABNORMAL HIGH (ref 70–99)
POTASSIUM: 6.7 meq/L — AB (ref 3.7–5.3)
SODIUM: 137 meq/L (ref 137–147)

## 2014-05-08 LAB — PRO B NATRIURETIC PEPTIDE: Pro B Natriuretic peptide (BNP): 33103 pg/mL — ABNORMAL HIGH (ref 0–125)

## 2014-05-08 LAB — MAGNESIUM: Magnesium: 2.2 mg/dL (ref 1.5–2.5)

## 2014-05-08 LAB — TSH: TSH: 0.611 u[IU]/mL (ref 0.350–4.500)

## 2014-05-08 LAB — APTT: aPTT: 31 seconds (ref 24–37)

## 2014-05-08 LAB — PROTIME-INR
INR: 1.08 (ref 0.00–1.49)
Prothrombin Time: 14 seconds (ref 11.6–15.2)

## 2014-05-08 LAB — POTASSIUM: POTASSIUM: 4.6 meq/L (ref 3.7–5.3)

## 2014-05-08 LAB — MRSA PCR SCREENING: MRSA BY PCR: NEGATIVE

## 2014-05-08 LAB — TROPONIN I: Troponin I: <0.3 ng/mL (ref <0.30)

## 2014-05-08 LAB — CBG MONITORING, ED: Glucose-Capillary: 207 mg/dL — ABNORMAL HIGH (ref 70–99)

## 2014-05-08 MED ORDER — DILTIAZEM HCL 100 MG IV SOLR: 5.0000 mg/h | INTRAVENOUS | Status: DC

## 2014-05-08 MED ORDER — POLYETHYLENE GLYCOL 3350 17 G PO PACK
17.0000 g | PACK | Freq: Every day | ORAL | Status: DC | PRN
  Administered 2014-05-10 – 2014-05-11 (×2): 17 g via ORAL
  Filled 2014-05-08 (×2): qty 1

## 2014-05-08 MED ORDER — SENNOSIDES-DOCUSATE SODIUM 8.6-50 MG PO TABS
2.0000 | ORAL_TABLET | Freq: Every day | ORAL | Status: DC
  Administered 2014-05-08 – 2014-05-12 (×5): 2 via ORAL
  Filled 2014-05-08 (×5): qty 2

## 2014-05-08 MED ORDER — CALCITRIOL 0.5 MCG PO CAPS
2.0000 ug | ORAL_CAPSULE | ORAL | Status: DC
  Administered 2014-05-09 – 2014-05-12 (×2): 2 ug via ORAL
  Filled 2014-05-08 (×2): qty 4

## 2014-05-08 MED ORDER — FENTANYL CITRATE 0.05 MG/ML IJ SOLN
50.0000 ug | Freq: Once | INTRAMUSCULAR | Status: AC
  Administered 2014-05-08: 50 ug via INTRAVENOUS
  Filled 2014-05-08: qty 2

## 2014-05-08 MED ORDER — ONDANSETRON HCL 4 MG PO TABS
4.0000 mg | ORAL_TABLET | Freq: Four times a day (QID) | ORAL | Status: DC | PRN
  Administered 2014-05-11: 4 mg via ORAL
  Filled 2014-05-08: qty 1

## 2014-05-08 MED ORDER — ALUM & MAG HYDROXIDE-SIMETH 200-200-20 MG/5ML PO SUSP
30.0000 mL | Freq: Four times a day (QID) | ORAL | Status: DC | PRN
  Administered 2014-05-11: 30 mL via ORAL
  Filled 2014-05-08 (×3): qty 30

## 2014-05-08 MED ORDER — HEPARIN SODIUM (PORCINE) 1000 UNIT/ML DIALYSIS: 9800.0000 [IU] | Freq: Once | INTRAMUSCULAR | Status: DC

## 2014-05-08 MED ORDER — ONDANSETRON HCL 4 MG/2ML IJ SOLN: 4.0000 mg | Freq: Four times a day (QID) | INTRAMUSCULAR | Status: DC | PRN

## 2014-05-08 MED ORDER — NEPRO/CARBSTEADY PO LIQD: 237.0000 mL | ORAL | Status: DC | PRN

## 2014-05-08 MED ORDER — ASPIRIN EC 81 MG PO TBEC
81.0000 mg | DELAYED_RELEASE_TABLET | Freq: Every day | ORAL | Status: DC
  Administered 2014-05-08 – 2014-05-12 (×4): 81 mg via ORAL
  Filled 2014-05-08 (×5): qty 1

## 2014-05-08 MED ORDER — CINACALCET HCL 30 MG PO TABS
90.0000 mg | ORAL_TABLET | Freq: Every day | ORAL | Status: DC
  Administered 2014-05-08 – 2014-05-12 (×5): 90 mg via ORAL
  Filled 2014-05-08 (×6): qty 3

## 2014-05-08 MED ORDER — LORAZEPAM 0.5 MG PO TABS
0.5000 mg | ORAL_TABLET | Freq: Three times a day (TID) | ORAL | Status: DC | PRN
  Administered 2014-05-08 – 2014-05-11 (×3): 0.5 mg via ORAL
  Filled 2014-05-08 (×3): qty 1

## 2014-05-08 MED ORDER — LIDOCAINE HCL (PF) 1 % IJ SOLN: 5.0000 mL | INTRAMUSCULAR | Status: DC | PRN

## 2014-05-08 MED ORDER — OXYCODONE-ACETAMINOPHEN 5-325 MG PO TABS
1.0000 | ORAL_TABLET | Freq: Four times a day (QID) | ORAL | Status: DC | PRN
  Administered 2014-05-08 – 2014-05-12 (×10): 1 via ORAL
  Filled 2014-05-08 (×10): qty 1

## 2014-05-08 MED ORDER — HEPARIN SODIUM (PORCINE) 1000 UNIT/ML DIALYSIS: 1000.0000 [IU] | INTRAMUSCULAR | Status: DC | PRN

## 2014-05-08 MED ORDER — LIDOCAINE-PRILOCAINE 2.5-2.5 % EX CREA: 1.0000 "application " | TOPICAL_CREAM | CUTANEOUS | Status: DC | PRN

## 2014-05-08 MED ORDER — FENTANYL CITRATE 0.05 MG/ML IJ SOLN
50.0000 ug | INTRAMUSCULAR | Status: DC | PRN
Start: 2014-05-08 — End: 2014-05-12
  Administered 2014-05-08 – 2014-05-09 (×2): 50 ug via INTRAVENOUS
  Filled 2014-05-08 (×2): qty 2

## 2014-05-08 MED ORDER — AMLODIPINE BESYLATE 10 MG PO TABS
10.0000 mg | ORAL_TABLET | Freq: Every day | ORAL | Status: DC
  Administered 2014-05-08 – 2014-05-11 (×4): 10 mg via ORAL
  Filled 2014-05-08 (×5): qty 1

## 2014-05-08 MED ORDER — DIPHENHYDRAMINE HCL 25 MG PO CAPS: 50.0000 mg | ORAL_CAPSULE | Freq: Every evening | ORAL | Status: DC | PRN

## 2014-05-08 MED ORDER — NITROGLYCERIN 2 % TD OINT
1.0000 [in_us] | TOPICAL_OINTMENT | Freq: Once | TRANSDERMAL | Status: AC
  Administered 2014-05-08: 1 [in_us] via TOPICAL
  Filled 2014-05-08: qty 1

## 2014-05-08 MED ORDER — SODIUM CHLORIDE 0.9 % IV SOLN: 100.0000 mL | INTRAVENOUS | Status: DC | PRN

## 2014-05-08 MED ORDER — CARVEDILOL 3.125 MG PO TABS
3.1250 mg | ORAL_TABLET | Freq: Two times a day (BID) | ORAL | Status: DC
  Administered 2014-05-08 – 2014-05-12 (×7): 3.125 mg via ORAL
  Filled 2014-05-08 (×10): qty 1

## 2014-05-08 MED ORDER — LAMIVUDINE 10 MG/ML PO SOLN
50.0000 mg | Freq: Every day | ORAL | Status: DC
  Filled 2014-05-08: qty 5

## 2014-05-08 MED ORDER — IPRATROPIUM-ALBUTEROL 0.5-2.5 (3) MG/3ML IN SOLN: 3.0000 mL | Freq: Four times a day (QID) | RESPIRATORY_TRACT | Status: DC | PRN

## 2014-05-08 MED ORDER — FLUOXETINE HCL 20 MG PO CAPS
20.0000 mg | ORAL_CAPSULE | Freq: Every day | ORAL | Status: DC
  Administered 2014-05-08 – 2014-05-12 (×5): 20 mg via ORAL
  Filled 2014-05-08 (×5): qty 1

## 2014-05-08 MED ORDER — PENTAFLUOROPROP-TETRAFLUOROETH EX AERO: 1.0000 "application " | INHALATION_SPRAY | CUTANEOUS | Status: DC | PRN

## 2014-05-08 MED ORDER — ISOSORBIDE MONONITRATE ER 30 MG PO TB24
30.0000 mg | ORAL_TABLET | Freq: Every day | ORAL | Status: DC
  Administered 2014-05-08 – 2014-05-12 (×5): 30 mg via ORAL
  Filled 2014-05-08 (×5): qty 1

## 2014-05-08 MED ORDER — SODIUM CHLORIDE 0.9 % IV SOLN: 250.0000 mL | INTRAVENOUS | Status: DC | PRN

## 2014-05-08 MED ORDER — ACETAMINOPHEN 650 MG RE SUPP: 650.0000 mg | Freq: Four times a day (QID) | RECTAL | Status: DC | PRN

## 2014-05-08 MED ORDER — ACETAMINOPHEN 325 MG PO TABS
ORAL_TABLET | ORAL | Status: AC
  Administered 2014-05-08: 650 mg
  Filled 2014-05-08: qty 2

## 2014-05-08 MED ORDER — ZOLPIDEM TARTRATE 5 MG PO TABS
5.0000 mg | ORAL_TABLET | Freq: Once | ORAL | Status: DC
  Filled 2014-05-08: qty 1

## 2014-05-08 MED ORDER — ALTEPLASE 2 MG IJ SOLR: 2.0000 mg | Freq: Once | INTRAMUSCULAR | Status: DC | PRN

## 2014-05-08 MED ORDER — RENA-VITE PO TABS
1.0000 | ORAL_TABLET | Freq: Every day | ORAL | Status: DC
  Administered 2014-05-08 – 2014-05-11 (×4): 1 via ORAL
  Filled 2014-05-08 (×5): qty 1

## 2014-05-08 MED ORDER — SODIUM CHLORIDE 0.9 % IV SOLN: 62.5000 mg | INTRAVENOUS | Status: DC

## 2014-05-08 MED ORDER — HYDRALAZINE HCL 25 MG PO TABS
25.0000 mg | ORAL_TABLET | Freq: Three times a day (TID) | ORAL | Status: DC
  Administered 2014-05-08 – 2014-05-12 (×11): 25 mg via ORAL
  Filled 2014-05-08 (×15): qty 1

## 2014-05-08 MED ORDER — SODIUM CHLORIDE 0.9 % IJ SOLN
3.0000 mL | Freq: Two times a day (BID) | INTRAMUSCULAR | Status: DC
  Administered 2014-05-08 – 2014-05-09 (×3): 3 mL via INTRAVENOUS

## 2014-05-08 MED ORDER — COLCHICINE 0.6 MG PO TABS
0.6000 mg | ORAL_TABLET | Freq: Every day | ORAL | Status: DC
  Administered 2014-05-08 – 2014-05-12 (×5): 0.6 mg via ORAL
  Filled 2014-05-08 (×5): qty 1

## 2014-05-08 MED ORDER — SEVELAMER CARBONATE 800 MG PO TABS
1600.0000 mg | ORAL_TABLET | Freq: Three times a day (TID) | ORAL | Status: DC
Start: 2014-05-08 — End: 2014-05-08

## 2014-05-08 MED ORDER — SODIUM CHLORIDE 0.9 % IJ SOLN
3.0000 mL | Freq: Two times a day (BID) | INTRAMUSCULAR | Status: DC
  Administered 2014-05-08 – 2014-05-10 (×5): 3 mL via INTRAVENOUS

## 2014-05-08 MED ORDER — LAMOTRIGINE 25 MG PO TABS
25.0000 mg | ORAL_TABLET | Freq: Every day | ORAL | Status: DC
  Administered 2014-05-08 – 2014-05-09 (×2): 25 mg via ORAL
  Filled 2014-05-08 (×3): qty 1

## 2014-05-08 MED ORDER — HEPARIN SODIUM (PORCINE) 1000 UNIT/ML DIALYSIS: 40.0000 [IU]/kg | INTRAMUSCULAR | Status: DC | PRN

## 2014-05-08 MED ORDER — SODIUM CHLORIDE 0.9 % IJ SOLN: 3.0000 mL | INTRAMUSCULAR | Status: DC | PRN

## 2014-05-08 MED ORDER — CARVEDILOL 3.125 MG PO TABS: 3.1250 mg | ORAL_TABLET | Freq: Two times a day (BID) | ORAL | Status: DC

## 2014-05-08 MED ORDER — AMLODIPINE BESYLATE 5 MG PO TABS
5.0000 mg | ORAL_TABLET | Freq: Every day | ORAL | Status: DC
  Filled 2014-05-08: qty 1

## 2014-05-08 MED ORDER — RALTEGRAVIR POTASSIUM 400 MG PO TABS
400.0000 mg | ORAL_TABLET | Freq: Two times a day (BID) | ORAL | Status: DC
  Filled 2014-05-08 (×2): qty 1

## 2014-05-08 MED ORDER — SEVELAMER CARBONATE 800 MG PO TABS
2400.0000 mg | ORAL_TABLET | Freq: Three times a day (TID) | ORAL | Status: DC
  Administered 2014-05-08 – 2014-05-12 (×11): 2400 mg via ORAL
  Filled 2014-05-08 (×15): qty 3

## 2014-05-08 MED ORDER — TENOFOVIR DISOPROXIL FUMARATE 300 MG PO TABS
300.0000 mg | ORAL_TABLET | ORAL | Status: DC
  Filled 2014-05-08: qty 1

## 2014-05-08 MED ORDER — ACETAMINOPHEN 325 MG PO TABS
650.0000 mg | ORAL_TABLET | Freq: Four times a day (QID) | ORAL | Status: DC | PRN
  Administered 2014-05-09: 650 mg via ORAL

## 2014-05-08 MED ORDER — DILTIAZEM LOAD VIA INFUSION
10.0000 mg | Freq: Once | INTRAVENOUS | Status: DC
  Filled 2014-05-08: qty 10

## 2014-05-08 MED ORDER — HEPARIN SODIUM (PORCINE) 5000 UNIT/ML IJ SOLN
5000.0000 [IU] | Freq: Three times a day (TID) | INTRAMUSCULAR | Status: DC
  Administered 2014-05-08 – 2014-05-12 (×10): 5000 [IU] via SUBCUTANEOUS
  Filled 2014-05-08 (×14): qty 1
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
## 2014-05-08 NOTE — ED Notes (Signed)
RN unable to get labs.  

## 2014-05-08 NOTE — ED Notes (Signed)
Called dialysis and made aware potassium is 6.7. Admitting MD has seen patient in the ED.

## 2014-05-08 NOTE — Consult Note (Signed)
Indication for Consultation:  Management of ESRD/hemodialysis; anemia, hypertension/volume and secondary hyperparathyroidism  HPI: [name] is a 63 y.o. female who presented to the ED yesterday with complaints of sudden onset sob. She receives HD TTS @ [facility], she missed HD yesterday and has not been running full treatments because she 'has a lot of stuff going on'. She recently moved back to [place] from [place] but states she doesn't have a place to live yet. She reports she had been feeling well until she started to experience anxiety and sob yesterday and called EMS, she also reports a cough for a few days. Xray showed development of pulm infiltrates suggesting edema or pneumonia. K+ was elevated at 6.7. She is feeling slightly better with HD, K+ improved.  Will schedule HD again tomorrow. She is hoping social work can assist in finding her placement  Past Medical History  Diagnosis Date  . Hypertension   . HIV infection   . Morbid obesity   . ESRD (end stage renal disease) on dialysis 09/30/2013    Started dialysis in [place], [place] around 2009.  ESRD due to HTN vs drug abuse according to pt.  Was on dialysis at [facility] until Feb 2015 when she was admitted to a SNF due to homelessness and drug abuse.  Then changed to [facility] on TTS schedule.     . Anginal pain   . CHF (congestive heart failure)   . Shortness of breath   . Pneumonia   . Depression   . Anxiety   . GERD (gastroesophageal reflux disease)   . Arthritis   . Anemia    Past Surgical History  Procedure Laterality Date  . Arteriovenous graft placement      left forearm  . Cardiac catheterization    . Laparotomy      states seh was cut open because seh was bleeding on the inside   Family History  Problem Relation Age of Onset  . Kidney failure Other     niece  . High blood pressure    . Lung cancer    . Breast cancer Neg Hx   . Colon cancer Neg Hx   . Stroke Mother   . HIV/AIDS Brother     died  of AIDS   Social History:  reports that she has been smoking Cigarettes.  She has been smoking about 0.50 packs per day. She has never used smokeless tobacco. She reports that she uses illicit drugs (Marijuana and "Crack" cocaine) about once per week. She reports that she does not drink alcohol. Allergies  Allergen Reactions  . Tramadol     "Gives me bad dreams"  . Morphine And Related Hives and Rash   Prior to Admission medications   Medication Sig Start Date End Date Taking? Authorizing Provider  amLODipine (NORVASC) 5 MG tablet Take 10 mg by mouth daily.    Historical Provider, MD  aspirin EC 81 MG tablet Take 81 mg by mouth daily.    Historical Provider, MD  carvedilol (COREG) 3.125 MG tablet Take 3.125 mg by mouth 2 (two) times daily with a meal.    Historical Provider, MD  cinacalcet (SENSIPAR) 90 MG tablet Take 90 mg by mouth daily.    Historical Provider, MD  colchicine 0.6 MG tablet Take 0.6 mg by mouth daily.    Historical Provider, MD  diclofenac sodium (VOLTAREN) 1 % GEL Apply 4 g topically 4 (four) times daily as needed (pain).     Historical  Provider, MD  diphenhydrAMINE (BENADRYL) 50 MG capsule Take 50 mg by mouth at bedtime as needed for sleep.    Historical Provider, MD  FLUoxetine (PROZAC) 20 MG capsule Take 20 mg by mouth daily.    Historical Provider, MD  guaiFENesin-dextromethorphan (ROBITUSSIN DM) 100-10 MG/5ML syrup Take 5 mLs by mouth every 4 (four) hours as needed for cough. 04/17/14   Kathlen ModyVijaya Akula, MD  hydrALAZINE (APRESOLINE) 25 MG tablet Take 1 tablet (25 mg total) by mouth every 8 (eight) hours. 04/17/14   Kathlen ModyVijaya Akula, MD  ipratropium-albuterol (DUONEB) 0.5-2.5 (3) MG/3ML SOLN Take 3 mLs by nebulization every 6 (six) hours as needed. 04/17/14   Kathlen ModyVijaya Akula, MD  isosorbide mononitrate (IMDUR) 30 MG 24 hr tablet Take 30 mg by mouth daily.    Historical Provider, MD  lamiVUDine (EPIVIR) 10 MG/ML solution Take 50 mg by mouth daily.    Historical Provider, MD   lamoTRIgine (LAMICTAL) 25 MG tablet Take 25 mg by mouth daily.    Historical Provider, MD  LORazepam (ATIVAN) 0.5 MG tablet Take 0.5 mg by mouth every 8 (eight) hours as needed for anxiety.    Historical Provider, MD  multivitamin (RENA-VIT) TABS tablet Take 1 tablet by mouth daily.    Historical Provider, MD  omeprazole (PRILOSEC) 20 MG capsule Take 20 mg by mouth daily.    Historical Provider, MD  polyethylene glycol (MIRALAX / GLYCOLAX) packet Take 17 g by mouth daily as needed for mild constipation.     Historical Provider, MD  raltegravir (ISENTRESS) 400 MG tablet Take 400 mg by mouth 2 (two) times daily.     Historical Provider, MD  senna-docusate (SENOKOT-S) 8.6-50 MG per tablet Take 2 tablets by mouth daily.    Historical Provider, MD  sevelamer carbonate (RENVELA) 800 MG tablet Take 1,600-2,400 mg by mouth 3 (three) times daily with meals. *takes 2400mg  with meals and 1600mg  with snacks*    Historical Provider, MD  tenofovir (VIREAD) 300 MG tablet Take 300 mg by mouth every Friday. Takes on Fridays.    Historical Provider, MD   Current Facility-Administered Medications  Medication Dose Route Frequency Provider Last Rate Last Dose  . diltiazem (CARDIZEM) 1 mg/mL load via infusion 10 mg  10 mg Intravenous Once Gerhard Munchobert Lockwood, MD       And  . diltiazem (CARDIZEM) 100 mg in dextrose 5 % 100 mL (1 mg/mL) infusion  5-15 mg/hr Intravenous Continuous Gerhard Munchobert Lockwood, MD       Current Outpatient Prescriptions  Medication Sig Dispense Refill  . amLODipine (NORVASC) 5 MG tablet Take 10 mg by mouth daily.      Marland Kitchen. aspirin EC 81 MG tablet Take 81 mg by mouth daily.      . carvedilol (COREG) 3.125 MG tablet Take 3.125 mg by mouth 2 (two) times daily with a meal.      . cinacalcet (SENSIPAR) 90 MG tablet Take 90 mg by mouth daily.      . colchicine 0.6 MG tablet Take 0.6 mg by mouth daily.      . diclofenac sodium (VOLTAREN) 1 % GEL Apply 4 g topically 4 (four) times daily as needed (pain).        Marland Kitchen. diphenhydrAMINE (BENADRYL) 50 MG capsule Take 50 mg by mouth at bedtime as needed for sleep.      Marland Kitchen. FLUoxetine (PROZAC) 20 MG capsule Take 20 mg by mouth daily.      Marland Kitchen. guaiFENesin-dextromethorphan (ROBITUSSIN DM) 100-10 MG/5ML syrup Take 5 mLs by mouth  every 4 (four) hours as needed for cough.  118 mL  0  . hydrALAZINE (APRESOLINE) 25 MG tablet Take 1 tablet (25 mg total) by mouth every 8 (eight) hours.  90 tablet  1  . ipratropium-albuterol (DUONEB) 0.5-2.5 (3) MG/3ML SOLN Take 3 mLs by nebulization every 6 (six) hours as needed.  360 mL  1  . isosorbide mononitrate (IMDUR) 30 MG 24 hr tablet Take 30 mg by mouth daily.      Marland Kitchen lamiVUDine (EPIVIR) 10 MG/ML solution Take 50 mg by mouth daily.      Marland Kitchen lamoTRIgine (LAMICTAL) 25 MG tablet Take 25 mg by mouth daily.      Marland Kitchen LORazepam (ATIVAN) 0.5 MG tablet Take 0.5 mg by mouth every 8 (eight) hours as needed for anxiety.      . multivitamin (RENA-VIT) TABS tablet Take 1 tablet by mouth daily.      Marland Kitchen omeprazole (PRILOSEC) 20 MG capsule Take 20 mg by mouth daily.      . polyethylene glycol (MIRALAX / GLYCOLAX) packet Take 17 g by mouth daily as needed for mild constipation.       . raltegravir (ISENTRESS) 400 MG tablet Take 400 mg by mouth 2 (two) times daily.       Marland Kitchen senna-docusate (SENOKOT-S) 8.6-50 MG per tablet Take 2 tablets by mouth daily.      . sevelamer carbonate (RENVELA) 800 MG tablet Take 1,600-2,400 mg by mouth 3 (three) times daily with meals. *takes 2400mg  with meals and 1600mg  with snacks*      . tenofovir (VIREAD) 300 MG tablet Take 300 mg by mouth every Friday. Takes on Fridays.       Labs: Basic Metabolic Panel:  Recent Labs Lab 05/05/14 0205 05/07/14 1234 05/08/14 0700 05/08/14 0848  NA 141 143 137  --   K 5.1 5.7* 6.7* 4.6  CL 94* 98 93*  --   CO2 25 24 21   --   GLUCOSE 180* 93 112*  --   BUN 62* 53* 69*  --   CREATININE 11.53* 11.41* 12.44*  --   CALCIUM 8.9 9.3 9.4  --    Liver Function Tests: No results found for  this basename: AST, ALT, ALKPHOS, BILITOT, PROT, ALBUMIN,  in the last 168 hours No results found for this basename: LIPASE, AMYLASE,  in the last 168 hours No results found for this basename: AMMONIA,  in the last 168 hours CBC:  Recent Labs Lab 05/01/14 2312  05/05/14 0205 05/07/14 1234 05/08/14 0205  WBC 7.7  --  7.9 7.2 16.3*  NEUTROABS  --   --  4.1 3.5  --   HGB 10.9*  < > 10.7* 10.4* 12.7  HCT 34.3*  < > 33.1* 32.9* 39.5  MCV 105.5*  --  107.8* 105.8* 107.3*  PLT 263  --  244 236 273  < > = values in this interval not displayed. Cardiac Enzymes:  Recent Labs Lab 05/02/14 0010 05/08/14 0205  TROPONINI <0.30 <0.30   CBG:  Recent Labs Lab 05/08/14 0023  GLUCAP 207*   Iron Studies: No results found for this basename: IRON, TIBC, TRANSFERRIN, FERRITIN,  in the last 72 hours Studies/Results: Dg Chest Port 1 View  05/08/2014   CLINICAL DATA:  Shortness of breath.  EXAM: PORTABLE CHEST - 1 VIEW  COMPARISON:  05/07/2014  FINDINGS: Shallow inspiration. Cardiac enlargement with pulmonary vascular congestion. Prominent bilateral pulmonary infiltrates, increasing since previous study. This could be due to edema or bilateral pneumonia.  No blunting of costophrenic angles. No pneumothorax. Old resection or resorption of the distal right clavicle.  IMPRESSION: Interval development of severe bilateral pulmonary infiltrates suggesting edema or bilateral pneumonia.   Electronically Signed   By: Burman Nieves M.D.   On: 05/08/2014 01:01   Dg Chest Portable 1 View  05/07/2014   CLINICAL DATA:  Cough; dialysis dependent renal failure  EXAM: PORTABLE CHEST - 1 VIEW  COMPARISON:  PA and lateral chest of May 02, 2014  FINDINGS: The lungs are adequately inflated. The interstitial markings are increased in the pulmonary vascularity is engorged. There is stable scarring in the mid lungs bilaterally. The cardiac silhouette is mildly enlarged. There is no pleural effusion.  IMPRESSION: Pulmonary  interstitial edema of cardiac or noncardiac cause. There is no pneumonia.   Electronically Signed   By: David  [place]   On: 05/07/2014 13:46    Review of Systems: Gen: Denies any fever, chills, sweats, anorexia, fatigue, weakness, malaise, weight loss, and sleep disorder HEENT: No visual complaints, No history of Retinopathy. Normal external appearance No Epistaxis or Sore throat. No sinusitis.   CV: Denies chest pain, angina, palpitations, syncope, orthopnea, PND, peripheral edema, and claudication. Resp: Reports  dyspnea at rest, dyspnea with exercise, now improved. reports cough with sputum. Denies wheezing, coughing up blood, and pleurisy. GI: Denies vomiting blood, jaundice, and fecal incontinence.   Denies dysphagia or odynophagia. GU : Denies urinary burning, blood in urine, urinary frequency, urinary hesitancy, nocturnal urination, and urinary incontinence.  No renal calculi. MS: Reports bilat knee pain, limitation of movement, and swelling. Denies muscle weakness, cramps, atrophy.  No use of non steroidal antiinflammatory drugs. Derm: Denies rash, itching, dry skin, hives, moles, warts, or unhealing ulcers.  Psych: Reports anxiety, improves with ativan.Denies depression, memory loss, suicidal ideation, hallucinations, paranoia, and confusion. Heme: Denies bruising, bleeding, and enlarged lymph nodes. Neuro: No headache.  No diplopia. No dysarthria.  No dysphasia.  No history of CVA.  No Seizures. No paresthesias.  No weakness. Endocrine No DM.  No Thyroid disease.  No Adrenal disease.  Physical Exam: Filed Vitals:   05/08/14 0830 05/08/14 0900 05/08/14 0930 05/08/14 1000  BP: 133/70 140/86 113/69 147/115  Pulse: 104 105 110 102  Temp:      TempSrc:      Resp: 33 26 20 20   SpO2:         General: Well developed, well nourished, in no acute distress. Head: Normocephalic, atraumatic, sclera non-icteric, mucus membranes are moist Neck: Supple. Mild JVD  Lungs: Bibasilar crackles.  Breathing is unlabored. Heart: RRR with S1 S2. No murmurs, rubs, or gallops appreciated. Abdomen: Soft, non-tender, non-distended with normoactive bowel sounds. No rebound/guarding. No obvious abdominal masses. M-S:  Strength and tone appear normal for age. Lower extremities: +1RLE edema. Trace LLE Neuro: Alert and oriented X 3. Moves all extremities spontaneously. Psych:  Responds to questions appropriately with a normal affect. Dialysis Access: L AVF patent on HD  Dialysis Orders: TTS [facility] 4hr 107.5kgs 2k/2ca  9800 Heparin L AVF 400/800   Calcitriol 2 mcg IV/HD Aranesp40 q wk Venofer 50q wk   Assessment/Plan: 1. Volume overload-s/p missed HD. pulm infiltraes on xray. Post HD wt 111.2kg on 7/28, unable to get wt here. 2. ESRD - TTS @ [facility], HD today and tomorrow hyperkalemia resolved with HD. Hx noncomplaince 3. Hypertension/volume  - 153/114- home coreg and norvasc. Cont volume removal w HD 4. Anemia  - hgb 12.7, hold ESA, cont Fe q Thursday. 5.  Metabolic bone disease -  Ca+9.4, cont renvela and calcitriol 6. Nutrition -  7. HIV- per primary 8. Dispo- social work to assist.   Jetty Duhamel, NP [employer] [phone] 05/08/2014, 10:16 AM   Pt seen, examined and agree w A/P as above. ESRD patient in 2009 and been bouncing around from town to town moving dialysis units from time to time. She started in [facility], has been on dialysis in [place], now [place].  Persistent problems with drug abuse, homelessness, support issues. No easy solution here.   Vinson Moselle MD pager [phone]    cell [phone] 05/08/2014, 12:30 PM

## 2014-05-08 NOTE — ED Notes (Signed)
Pt arrives with c/o SHOB, flash pulmonary edema. Missed dialysis yesterday, positive rales and wheezing bilaterally. Arrives on CPAP with [facility]. No IV established in the field. VS 220/140, HR tachy at 164

## 2014-05-08 NOTE — ED Provider Notes (Addendum)
CSN: [id number]     Arrival date & time    History   First MD Initiated Contact with Patient 05/08/14 0019     Chief Complaint  Patient presents with  . Shortness of Breath     (Consider location/radiation/quality/duration/timing/severity/associated sxs/prior Treatment) HPI  Patient presents to EMS in extremis. Per EMS the patient was found to be dyspneic, diaphoretic, hypertensive, tachypneic, tachycardic after they received a call for respiratory distress. Patient is unable to provide details of her illness on my initial evaluation. Secondary to distress, level V caveat. On per chart review, the patient was seen her, approximately 8 hours ago for different complaints. She has been in this facility multiple times recently.   Past Medical History  Diagnosis Date  . Hypertension   . HIV infection   . Morbid obesity   . ESRD (end stage renal disease) on dialysis 09/30/2013    Started dialysis in [place], [place] around 2009.  ESRD due to HTN vs drug abuse according to pt.  Was on dialysis at [facility] until Feb 2015 when she was admitted to a SNF due to homelessness and drug abuse.  Then changed to [facility] on TTS schedule.     . Anginal pain   . CHF (congestive heart failure)   . Shortness of breath   . Pneumonia   . Depression   . Anxiety   . GERD (gastroesophageal reflux disease)   . Arthritis   . Anemia    Past Surgical History  Procedure Laterality Date  . Arteriovenous graft placement      left forearm  . Cardiac catheterization    . Laparotomy      states seh was cut open because seh was bleeding on the inside   Family History  Problem Relation Age of Onset  . Kidney failure Other     niece  . High blood pressure    . Lung cancer    . Breast cancer Neg Hx   . Colon cancer Neg Hx   . Stroke Mother   . HIV/AIDS Brother     died of AIDS   History  Substance Use Topics  . Smoking status: Current Every Day Smoker -- 0.50 packs/day    Types: Cigarettes  .  Smokeless tobacco: Never Used  . Alcohol Use: No   OB History   Grav Para Term Preterm Abortions TAB SAB Ect Mult Living                 Review of Systems  Unable to perform ROS: Acuity of condition      Allergies  Tramadol and Morphine and related  Home Medications   Prior to Admission medications   Medication Sig Start Date End Date Taking? Authorizing Provider  amLODipine (NORVASC) 5 MG tablet Take 10 mg by mouth daily.    Historical Provider, MD  aspirin EC 81 MG tablet Take 81 mg by mouth daily.    Historical Provider, MD  carvedilol (COREG) 3.125 MG tablet Take 3.125 mg by mouth 2 (two) times daily with a meal.    Historical Provider, MD  cinacalcet (SENSIPAR) 90 MG tablet Take 90 mg by mouth daily.    Historical Provider, MD  colchicine 0.6 MG tablet Take 0.6 mg by mouth daily.    Historical Provider, MD  diclofenac sodium (VOLTAREN) 1 % GEL Apply 4 g topically 4 (four) times daily as needed (pain).     Historical Provider, MD  diphenhydrAMINE (BENADRYL) 50 MG capsule  Take 50 mg by mouth at bedtime as needed for sleep.    Historical Provider, MD  FLUoxetine (PROZAC) 20 MG capsule Take 20 mg by mouth daily.    Historical Provider, MD  guaiFENesin-dextromethorphan (ROBITUSSIN DM) 100-10 MG/5ML syrup Take 5 mLs by mouth every 4 (four) hours as needed for cough. 04/17/14   Kathlen Mody, MD  hydrALAZINE (APRESOLINE) 25 MG tablet Take 1 tablet (25 mg total) by mouth every 8 (eight) hours. 04/17/14   Kathlen Mody, MD  ipratropium-albuterol (DUONEB) 0.5-2.5 (3) MG/3ML SOLN Take 3 mLs by nebulization every 6 (six) hours as needed. 04/17/14   Kathlen Mody, MD  isosorbide mononitrate (IMDUR) 30 MG 24 hr tablet Take 30 mg by mouth daily.    Historical Provider, MD  lamiVUDine (EPIVIR) 10 MG/ML solution Take 50 mg by mouth daily.    Historical Provider, MD  lamoTRIgine (LAMICTAL) 25 MG tablet Take 25 mg by mouth daily.    Historical Provider, MD  LORazepam (ATIVAN) 0.5 MG tablet Take 0.5 mg  by mouth every 8 (eight) hours as needed for anxiety.    Historical Provider, MD  multivitamin (RENA-VIT) TABS tablet Take 1 tablet by mouth daily.    Historical Provider, MD  omeprazole (PRILOSEC) 20 MG capsule Take 20 mg by mouth daily.    Historical Provider, MD  polyethylene glycol (MIRALAX / GLYCOLAX) packet Take 17 g by mouth daily as needed for mild constipation.     Historical Provider, MD  raltegravir (ISENTRESS) 400 MG tablet Take 400 mg by mouth 2 (two) times daily.     Historical Provider, MD  senna-docusate (SENOKOT-S) 8.6-50 MG per tablet Take 2 tablets by mouth daily.    Historical Provider, MD  sevelamer carbonate (RENVELA) 800 MG tablet Take 1,600-2,400 mg by mouth 3 (three) times daily with meals. *takes 2400mg  with meals and 1600mg  with snacks*    Historical Provider, MD  tenofovir (VIREAD) 300 MG tablet Take 300 mg by mouth every Friday. Takes on Fridays.    Historical Provider, MD   BP 185/120  Pulse 154  Resp 30  SpO2 94% Physical Exam  Constitutional: She is oriented to person, place, and time. She appears well-developed and well-nourished. She has a sickly appearance. She appears distressed.  HENT:  Head: Normocephalic and atraumatic.  Right Ear: External ear normal.  Left Ear: External ear normal.  Nose: Nose normal.  Mouth/Throat: Oropharynx is clear and moist.  Eyes: Conjunctivae and EOM are normal. Pupils are equal, round, and reactive to light. Right eye exhibits no discharge. Left eye exhibits no discharge. No scleral icterus.  Neck: Normal range of motion. Neck supple. No JVD present. No thyromegaly present.  Cardiovascular: Regular rhythm, S1 normal, S2 normal, normal heart sounds and intact distal pulses.  Tachycardia present.  Exam reveals no gallop and no friction rub.   No murmur heard. Pulmonary/Chest: Accessory muscle usage present. Tachypnea noted. She is in respiratory distress. She has decreased breath sounds. She has wheezes. She has rhonchi. She has  rales. She exhibits no tenderness.  Abdominal: Soft. Bowel sounds are normal. She exhibits no distension and no mass. There is no tenderness. There is no rebound, no guarding and no CVA tenderness.  Musculoskeletal: She exhibits no edema.  No gross deformities, but additional evaluation will be required later.  Lymphadenopathy:    She has no cervical adenopathy.  Neurological: She is alert and oriented to person, place, and time. She has normal reflexes. No cranial nerve deficit or sensory deficit. GCS eye  subscore is 4. GCS verbal subscore is 5. GCS motor subscore is 6.  Strength 4/5 due to pain on both legs.  Skin: No rash noted. She is not diaphoretic. No erythema. No pallor.  Psychiatric: Her behavior is normal. Her mood appears anxious.    ED Course  Procedures (including critical care time) Labs Review Labs Reviewed  CBC - Abnormal; Notable for the following:    WBC 16.3 (*)    RBC 3.68 (*)    MCV 107.3 (*)    MCH 34.5 (*)    All other components within normal limits  PRO B NATRIURETIC PEPTIDE - Abnormal; Notable for the following:    Pro B Natriuretic peptide (BNP) 33103.0 (*)    All other components within normal limits  CBG MONITORING, ED - Abnormal; Notable for the following:    Glucose-Capillary 207 (*)    All other components within normal limits  PROTIME-INR  APTT  TROPONIN I  MAGNESIUM  TSH  BASIC METABOLIC PANEL    Imaging Review Dg Chest Port 1 View  05/08/2014   CLINICAL DATA:  Shortness of breath.  EXAM: PORTABLE CHEST - 1 VIEW  COMPARISON:  05/07/2014  FINDINGS: Shallow inspiration. Cardiac enlargement with pulmonary vascular congestion. Prominent bilateral pulmonary infiltrates, increasing since previous study. This could be due to edema or bilateral pneumonia. No blunting of costophrenic angles. No pneumothorax. Old resection or resorption of the distal right clavicle.  IMPRESSION: Interval development of severe bilateral pulmonary infiltrates suggesting  edema or bilateral pneumonia.   Electronically Signed   By: Burman Nieves M.D.   On: 05/08/2014 01:01   Dg Chest Portable 1 View  05/07/2014   CLINICAL DATA:  Cough; dialysis dependent renal failure  EXAM: PORTABLE CHEST - 1 VIEW  COMPARISON:  PA and lateral chest of May 02, 2014  FINDINGS: The lungs are adequately inflated. The interstitial markings are increased in the pulmonary vascularity is engorged. There is stable scarring in the mid lungs bilaterally. The cardiac silhouette is mildly enlarged. There is no pleural effusion.  IMPRESSION: Pulmonary interstitial edema of cardiac or noncardiac cause. There is no pneumonia.   Electronically Signed   By: David  [place]   On: 05/07/2014 13:46     EKG Interpretation   Date/Time:  Friday May 08 2014 00:14:04 EDT Ventricular Rate:  160 PR Interval:    QRS Duration: 99 QT Interval:  316 QTC Calculation: 516 R Axis:   66 Text Interpretation:  Atrial fibrillation with rapid V-rate Atrial  fibrillation with rapid ventricular response Abnormal ekg Confirmed by  Gerhard Munch  MD ([id number]) on 05/08/2014 1:08:38 AM     Cardiac monitor 160 on arrival, abnormal, tachycardia. Pulse oximetry 100% with noninvasive ventilatory support.  On arrival is this patient's case with EMS, and the patient was transitioned to our noninvasive ventilatory support.  When given profound hypertension, she received nitroglycerin paste in addition to continuous supplemental oxygen.   Obtaining IV access was difficult, requiring multiple times with ultrasound, which were unsuccessful initially.   1:32 AM HR 120- sinus -  O2- 99% w bipap  6:45 AM Patient is in no distress, breathing more easily. I had a conversation with her about the need for dialysis. Patient states that she does not feel capable of lasting until dialysis scheduled for later today, but she only to get to dialysis.   Was notably elevated BNP, gross fluid overload status, her presentation for  respiratory distress, and discussed her case with our nephrologist here.  Patient will have dialysis, be admitted by the hospitalist team under observation status. MDM   This patient presents in respiratory distress.  Patient has been seen and evaluated multiple times in this emergency department and has a history of multiple medical problems, including difficulty managing her issues. Today, the patient required BiPAP, topical nitroglycerin, and eventually the patient had conversion from atrial fibrillation to sinus rhythm, and had substantially improved respiratory function. Venous access was extremely difficult, by all practitioners including, multiple physicians, but labs are concerning for overloaded status. EKG did not demonstrate notable T wave changes. (labs were very difficult to obtain in full and the initial concern for hyperkalemia was substantiated with a potassium value of 6.7) Patient required emergent dialysis, and after discussion with our nephrology colleagues, patient was admitted by the hospitalist team for facilitation of dialysis.    Gerhard Munchobert Katrinia Straker, MD 05/08/14 [phone]  CRITICAL CARE Performed by: Gerhard MunchLOCKWOOD, Mane Consolo Total critical care time: 50 Critical care time was exclusive of separately billable procedures and treating other patients. Critical care was necessary to treat or prevent imminent or life-threatening deterioration. Critical care was time spent personally by me on the following activities: development of treatment plan with patient and/or surrogate as well as nursing, discussions with consultants, evaluation of patient's response to treatment, examination of patient, obtaining history from patient or surrogate, ordering and performing treatments and interventions, ordering and review of laboratory studies, ordering and review of radiographic studies, pulse oximetry and re-evaluation of patient's condition.   Gerhard Munchobert Press Casale, MD 05/08/14 2328

## 2014-05-08 NOTE — ED Notes (Signed)
Pt placed on BiPAP, EDP at bedside

## 2014-05-08 NOTE — Clinical Social Work Note (Signed)
Nurse case manager [employer] [name] spoke with patient about this hospitalization and discharge plans. Patient talked about the relative she is currently staying with in [place] and indicated that she cannot return there. Patient explained that they smoke cigarettes and marijuana in the home and she cannot continue to stay there. Patient talked with nurse case manager regarding her other family members, including her 3 daughters and 2 son and reported that she cannot stay with any of them. Patient stated that 2 of her daughters have drug problems and one of them is trying to get and stay clean. Patient reported that her sons are doing well, but she cannot ask either one of them to house her.  Patient is requesting assistance with SNF placement. Nurse case manager explained to patient that she would need a qualifying stay for SNF placement and CSW will have to determine how many SNF days she has used. CSW will follow-up with patient if she remain in hospital on Monday.  [name], MSW, LCSW [phone]

## 2014-05-08 NOTE — ED Notes (Signed)
BiPAP removed per pt request, placed on nasal cannula

## 2014-05-08 NOTE — Progress Notes (Signed)
Patient complained of chest pain on the left side, worsening during inspiration and during positional changes.  MD notified, vitals obtained, EKG obtained.  Will continue to monitor.

## 2014-05-08 NOTE — Progress Notes (Signed)
Pharmacy Re: HIV medications  I interviewed patient about her prior to admission meds.  She says she has been very unclear about this since she left her SNF facility and is not sure what she's taking PTA.  She says her med bottles are not in a place where someone could find them and read them to me.  It appears she was last prescribed lamivudine 10 mg/mL - 50 mg po daily, raltegravir 400 mg BID, and tenofovir 300 mg every Friday.  She says she thinks she took the lamivudine 2-3 weeks ago, and the raltegravir 2 nights ago.  She says it's been over a month since she had tenofovir.  After discussing with Dr. Vanessa BarbaraZamora, have decided to hold her HIV medications until she can be followed up outpatient with ID clinic.  Tad MooreJessica Ritaj Dullea, Pharm D, BCPS  Clinical Pharmacist Pager [phone]  05/08/2014 2:09 PM

## 2014-05-08 NOTE — ED Notes (Signed)
Unk LightningJennaya D. Charge RN attempted IV, unsuccessful X1.

## 2014-05-08 NOTE — ED Notes (Signed)
Attempted to get blood sample for chemistry x2, unsuccessful. Dr. Jeraldine LootsLockwood aware

## 2014-05-08 NOTE — Progress Notes (Signed)
  Admission note:  Arrival Method: stretcher from ED Mental Orientation: A &O x 4, agitated, yelling about lunch tray Telemetry: placed on telemetry box 23, CCMD notified Assessment: See Doc Flowsheets Skin: old surgical scar midline abdomen.  Skin grossly intact Pain: complaining of right leg pain, MD notified and PRN percocet ordered and given Tubes: n/a  Fall Prevention Safety Plan: educated patient on fall prevention safety plan.  Non-slip socks on patient.  Bed alarm on.   6700 Orientation: Patient has been oriented to the unit, staff and to the room.

## 2014-05-08 NOTE — Progress Notes (Signed)
Pt refused labs to be drawn when attempted. Will continue to monitor.

## 2014-05-08 NOTE — ED Notes (Signed)
IV team unable to get blood from IV without compromising IV.

## 2014-05-08 NOTE — ED Notes (Signed)
Winn JockJames C. RN attempted IV x2, Tammi SouKimberly K. RN attempted IV x2, MD Jeraldine LootsLockwood attempted IV x2, No success. IV team paged.

## 2014-05-09 ENCOUNTER — Observation Stay (HOSPITAL_COMMUNITY): Payer: Medicare Other

## 2014-05-09 DIAGNOSIS — K5900 Constipation, unspecified: Secondary | ICD-10-CM | POA: Diagnosis not present

## 2014-05-09 DIAGNOSIS — N039 Chronic nephritic syndrome with unspecified morphologic changes: Secondary | ICD-10-CM | POA: Diagnosis present

## 2014-05-09 DIAGNOSIS — M1990 Unspecified osteoarthritis, unspecified site: Secondary | ICD-10-CM | POA: Diagnosis present

## 2014-05-09 DIAGNOSIS — Z888 Allergy status to other drugs, medicaments and biological substances status: Secondary | ICD-10-CM | POA: Diagnosis not present

## 2014-05-09 DIAGNOSIS — F1410 Cocaine abuse, uncomplicated: Secondary | ICD-10-CM | POA: Diagnosis present

## 2014-05-09 DIAGNOSIS — Z6841 Body Mass Index (BMI) 40.0 and over, adult: Secondary | ICD-10-CM | POA: Diagnosis not present

## 2014-05-09 DIAGNOSIS — Z5900 Homelessness unspecified: Secondary | ICD-10-CM | POA: Diagnosis not present

## 2014-05-09 DIAGNOSIS — I4891 Unspecified atrial fibrillation: Secondary | ICD-10-CM | POA: Diagnosis present

## 2014-05-09 DIAGNOSIS — F17200 Nicotine dependence, unspecified, uncomplicated: Secondary | ICD-10-CM | POA: Diagnosis present

## 2014-05-09 DIAGNOSIS — F1210 Cannabis abuse, uncomplicated: Secondary | ICD-10-CM | POA: Diagnosis present

## 2014-05-09 DIAGNOSIS — I120 Hypertensive chronic kidney disease with stage 5 chronic kidney disease or end stage renal disease: Secondary | ICD-10-CM | POA: Diagnosis present

## 2014-05-09 DIAGNOSIS — N2581 Secondary hyperparathyroidism of renal origin: Secondary | ICD-10-CM | POA: Diagnosis present

## 2014-05-09 DIAGNOSIS — I5043 Acute on chronic combined systolic (congestive) and diastolic (congestive) heart failure: Secondary | ICD-10-CM | POA: Diagnosis present

## 2014-05-09 DIAGNOSIS — D631 Anemia in chronic kidney disease: Secondary | ICD-10-CM | POA: Diagnosis present

## 2014-05-09 DIAGNOSIS — I509 Heart failure, unspecified: Secondary | ICD-10-CM | POA: Diagnosis present

## 2014-05-09 DIAGNOSIS — Z7982 Long term (current) use of aspirin: Secondary | ICD-10-CM | POA: Diagnosis not present

## 2014-05-09 DIAGNOSIS — Z9119 Patient's noncompliance with other medical treatment and regimen: Secondary | ICD-10-CM | POA: Diagnosis not present

## 2014-05-09 DIAGNOSIS — Z885 Allergy status to narcotic agent status: Secondary | ICD-10-CM | POA: Diagnosis not present

## 2014-05-09 DIAGNOSIS — M25569 Pain in unspecified knee: Secondary | ICD-10-CM | POA: Diagnosis present

## 2014-05-09 DIAGNOSIS — K219 Gastro-esophageal reflux disease without esophagitis: Secondary | ICD-10-CM | POA: Diagnosis present

## 2014-05-09 DIAGNOSIS — R5381 Other malaise: Secondary | ICD-10-CM | POA: Diagnosis present

## 2014-05-09 DIAGNOSIS — Z823 Family history of stroke: Secondary | ICD-10-CM | POA: Diagnosis not present

## 2014-05-09 DIAGNOSIS — Z21 Asymptomatic human immunodeficiency virus [HIV] infection status: Secondary | ICD-10-CM | POA: Diagnosis present

## 2014-05-09 DIAGNOSIS — R0602 Shortness of breath: Secondary | ICD-10-CM | POA: Diagnosis present

## 2014-05-09 DIAGNOSIS — Z91199 Patient's noncompliance with other medical treatment and regimen due to unspecified reason: Secondary | ICD-10-CM | POA: Diagnosis not present

## 2014-05-09 DIAGNOSIS — F411 Generalized anxiety disorder: Secondary | ICD-10-CM | POA: Diagnosis not present

## 2014-05-09 DIAGNOSIS — M899 Disorder of bone, unspecified: Secondary | ICD-10-CM | POA: Diagnosis present

## 2014-05-09 DIAGNOSIS — Z9115 Patient's noncompliance with renal dialysis: Secondary | ICD-10-CM | POA: Diagnosis not present

## 2014-05-09 DIAGNOSIS — J9600 Acute respiratory failure, unspecified whether with hypoxia or hypercapnia: Secondary | ICD-10-CM | POA: Diagnosis present

## 2014-05-09 DIAGNOSIS — Z992 Dependence on renal dialysis: Secondary | ICD-10-CM | POA: Diagnosis not present

## 2014-05-09 DIAGNOSIS — E875 Hyperkalemia: Secondary | ICD-10-CM | POA: Diagnosis present

## 2014-05-09 DIAGNOSIS — N186 End stage renal disease: Secondary | ICD-10-CM | POA: Diagnosis present

## 2014-05-09 LAB — RENAL FUNCTION PANEL
ALBUMIN: 3.3 g/dL — AB (ref 3.5–5.2)
Anion gap: 16 — ABNORMAL HIGH (ref 5–15)
BUN: 44 mg/dL — AB (ref 6–23)
CALCIUM: 8 mg/dL — AB (ref 8.4–10.5)
CO2: 24 mEq/L (ref 19–32)
CREATININE: 8.15 mg/dL — AB (ref 0.50–1.10)
Chloride: 94 mEq/L — ABNORMAL LOW (ref 96–112)
GFR calc Af Amer: 5 mL/min — ABNORMAL LOW (ref >90)
GFR calc non Af Amer: 5 mL/min — ABNORMAL LOW (ref >90)
Glucose, Bld: 91 mg/dL (ref 70–99)
Phosphorus: 5.2 mg/dL — ABNORMAL HIGH (ref 2.3–4.6)
Potassium: 4.7 mEq/L (ref 3.7–5.3)
Sodium: 134 mEq/L — ABNORMAL LOW (ref 137–147)

## 2014-05-09 LAB — CBC
HCT: 30.7 % — ABNORMAL LOW (ref 36.0–46.0)
HEMATOCRIT: 31.5 % — AB (ref 36.0–46.0)
Hemoglobin: 10 g/dL — ABNORMAL LOW (ref 12.0–15.0)
Hemoglobin: 9.7 g/dL — ABNORMAL LOW (ref 12.0–15.0)
MCH: 34.5 pg — ABNORMAL HIGH (ref 26.0–34.0)
MCH: 33.6 pg (ref 26.0–34.0)
MCHC: 31.7 g/dL (ref 30.0–36.0)
MCHC: 31.6 g/dL (ref 30.0–36.0)
MCV: 108.6 fL — AB (ref 78.0–100.0)
MCV: 106.2 fL — AB (ref 78.0–100.0)
Platelets: 217 10*3/uL (ref 150–400)
Platelets: 223 10*3/uL (ref 150–400)
RBC: 2.9 MIL/uL — ABNORMAL LOW (ref 3.87–5.11)
RBC: 2.89 MIL/uL — ABNORMAL LOW (ref 3.87–5.11)
RDW: 14 % (ref 11.5–15.5)
RDW: 14 % (ref 11.5–15.5)
WBC: 6.1 10*3/uL (ref 4.0–10.5)
WBC: 6.3 10*3/uL (ref 4.0–10.5)

## 2014-05-09 LAB — BASIC METABOLIC PANEL
Anion gap: 18 — ABNORMAL HIGH (ref 5–15)
BUN: 39 mg/dL — ABNORMAL HIGH (ref 6–23)
CALCIUM: 7.8 mg/dL — AB (ref 8.4–10.5)
CO2: 24 mEq/L (ref 19–32)
CREATININE: 7.07 mg/dL — AB (ref 0.50–1.10)
Chloride: 97 mEq/L (ref 96–112)
GFR calc Af Amer: 6 mL/min — ABNORMAL LOW (ref >90)
GFR calc non Af Amer: 6 mL/min — ABNORMAL LOW (ref >90)
GLUCOSE: 124 mg/dL — AB (ref 70–99)
Potassium: 4.6 mEq/L (ref 3.7–5.3)
Sodium: 139 mEq/L (ref 137–147)

## 2014-05-09 MED ORDER — ACETAMINOPHEN 325 MG PO TABS
ORAL_TABLET | ORAL | Status: AC
  Filled 2014-05-09: qty 2

## 2014-05-09 NOTE — Progress Notes (Signed)
  [facility] Progress Note   Subjective: No complaints  Filed Vitals:   05/09/14 1045 05/09/14 1100 05/09/14 1130 05/09/14 1200  BP: 144/77 127/79 123/73 117/67  Pulse: 90 91 90 90  Temp:      TempSrc:      Resp: 20 18 19 20   Height:      Weight:      SpO2:       Exam: Alert, no distress No JVD Chest clear bilat No jvd RRR no MRG Abd obese, soft, NTND L AVF patent Neuro is nf, Ox 3  HD: [facility]   107.5kg   2/2.0 Bath    Heparin 9800    L AVF  400/800 Calcitriol 2 ug TIW,  Aranesp 40 ug weekly, Venofer 50 mg weekly        Assessment: 1 SOB / pulm edema / vol excess-  Better 2 ESRD on HD 3 HTN/vol cont coreg, norvasc 4 Anemia, Hb up holding esa 5 HPTh cont binder and vit D 6 HIV 7 Social- very difficult psychosocial situation which has been chronic problem for her with difficulty finding a place to stay, hx of drug abuse, etc.  Would consider having a family meeting, maybe that would help?   Plan- HD today    [name] Elva Mauro MD  pager [phone]    cell [phone]  05/09/2014, 12:37 PM     Recent Labs Lab 05/08/14 0700 05/08/14 0848 05/09/14 0300 05/09/14 1035  NA 137  --  139 134*  K 6.7* 4.6 4.6 4.7  CL 93*  --  97 94*  CO2 21  --  24 24  GLUCOSE 112*  --  124* 91  BUN 69*  --  39* 44*  CREATININE 12.44*  --  7.07* 8.15*  CALCIUM 9.4  --  7.8* 8.0*  PHOS  --   --   --  5.2*    Recent Labs Lab 05/09/14 1035  ALBUMIN 3.3*    Recent Labs Lab 05/05/14 0205 05/07/14 1234 05/08/14 0205 05/09/14 0300 05/09/14 1036  WBC 7.9 7.2 16.3* 6.1 6.3  NEUTROABS 4.1 3.5  --   --   --   HGB 10.7* 10.4* 12.7 10.0* 9.7*  HCT 33.1* 32.9* 39.5 31.5* 30.7*  MCV 107.8* 105.8* 107.3* 108.6* 106.2*  PLT 244 236 273 217 223   . amLODipine  10 mg Oral QHS  . aspirin EC  81 mg Oral Daily  . calcitRIOL  2 mcg Oral Q T,Th,Sa-HD  . carvedilol  3.125 mg Oral BID WC  . cinacalcet  90 mg Oral Q breakfast  . colchicine  0.6 mg Oral Daily  . [START  ON 05/14/2014] ferric gluconate (FERRLECIT/NULECIT) IV  62.5 mg Intravenous Weekly  . FLUoxetine  20 mg Oral Daily  . heparin  5,000 Units Subcutaneous 3 times per day  . hydrALAZINE  25 mg Oral 3 times per day  . isosorbide mononitrate  30 mg Oral Daily  . lamoTRIgine  25 mg Oral Daily  . multivitamin  1 tablet Oral QHS  . senna-docusate  2 tablet Oral Daily  . sevelamer carbonate  2,400 mg Oral TID WC  . sodium chloride  3 mL Intravenous Q12H  . sodium chloride  3 mL Intravenous Q12H  . zolpidem  5 mg Oral Once     sodium chloride, acetaminophen, acetaminophen, alum & mag hydroxide-simeth, diphenhydrAMINE, fentaNYL, ipratropium-albuterol, LORazepam, ondansetron (ZOFRAN) IV, ondansetron, oxyCODONE-acetaminophen, polyethylene glycol, sodium chloride

## 2014-05-09 NOTE — Progress Notes (Signed)
UR completed 
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
## 2014-05-09 NOTE — Progress Notes (Signed)
PT Cancellation Note  Patient Details Name: Jean CooleyMargaret Ann [name] MRN: [id number] DOB: July 13, 1951   Cancelled Treatment:    Reason Eval/Treat Not Completed: Patient at procedure or test/unavailable   Pt in HD.  PT to re-attempt tomorrow.   Ilda FoilGarrow, Thuan Tippett Rene 05/09/2014, 1:30 PM   Aida RaiderWendy Chirsty Armistead, PT  Office # [phone] Pager [phone]
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
## 2014-05-10 NOTE — Evaluation (Signed)
Physical Therapy Evaluation Patient Details Name: Jean CooleyMargaret Ann Kydd MRN: [id number] DOB: 1951-06-13 Today's Date: 05/10/2014   History of Present Illness  This 63 y.o. female presented to ED with SOB and mild swelling.  She missed her HD apt and was found to be hyperkalemic.  She was admitted for HD.  PMH includes HTN, CHF; HIV+; ESRD; depression/anxiety; h/o polysubstance abuse, arthritis hips and knees.  Clinical Impression  Pt presents with bilateral knee pain and weakness which limits her functional mobility. Pt was unwilling to participate in standing or ambulating, stating that she "just can't." PT feels pt may not have given her full effort. PT was able to transfer pt from bed to chair with assistance and encouragement. She states it's been about 4 months since she was last able to walk, and that she previously used a walker and wheelchair. Pt has extremely limited bilateral LE strength due to pain and edema, though bilateral UE strength was [facility]. PT recommends pt d/c to SNF to ensure management of pain and increased strength so that ambulation and ADLs are achievable. Pt has stayed at SNF before, and reports good functional outcomes    Follow Up Recommendations SNF    Equipment Recommendations       Recommendations for Other Services       Precautions / Restrictions Precautions Precautions: None Restrictions Weight Bearing Restrictions: No       Mobility  Bed Mobility Overal bed mobility: Needs Assistance Bed Mobility: Supine to Sit     Supine to sit: Min assist     General bed mobility comments: +1 for helping to move legs to EOB  Transfers Overall transfer level: Needs assistance Equipment used: None Transfers: Lateral/Scoot Transfers (Bed to Recliner)          Lateral/Scoot Transfers: +2 physical assistance; Mod assist General transfer comment: +2 assistance and encouragement required due to bilateral knee pain and weakness. Chose Lateral scoot for bed to  chair transfer as pain was limiting any and all WBing Bil LEs; Cues for technique  Ambulation/Gait                Stairs            Wheelchair Mobility    Modified Rankin (Stroke Patients Only)       Balance                                             Pertinent Vitals/Pain Pt reported pain in both knees and R hip. No pain number was given. Nursing was notified to deliver pain meds.  patient repositioned for comfort and gentle heat applied to bil knees    Home Living Family/patient expects to be discharged to:: Skilled nursing facility Living Arrangements: Other (Comment)               Additional Comments: Pt has complicated living history and may not have a home to d/c to. She is working with social work to be d/c to a SNF.    Prior Function Level of Independence: Needs assistance   Gait / Transfers Assistance Needed: Pt uses manual wheelchair in the home and ambulates short distances with help from family (she does not currently have RW).  She reports she has been having difficulty negotiating the 2 steps into the apt.            Hand Dominance  Extremity/Trunk Assessment   Upper Extremity Assessment: Overall WFL for tasks assessed           Lower Extremity Assessment: Generalized weakness         Communication   Communication: No difficulties  Cognition Arousal/Alertness: Awake/alert                          General Comments General comments (skin integrity, edema, etc.): Bilateral knee edema    Exercises General Exercises - Lower Extremity Hip Flexion/Marching: Seated;5 reps;Left (R leg limited by pain)      Assessment/Plan    PT Assessment Patient needs continued PT services  PT Diagnosis Other (comment)   PT Problem List Decreased strength;Pain;Decreased activity tolerance  PT Treatment Interventions DME instruction;Functional mobility training;Therapeutic exercise, Gait training,  Modalities  PT Goals (Current goals can be found in the Care Plan section)      Frequency Min 3X/week   Barriers to discharge        Co-evaluation               End of Session Equipment Utilized During Treatment: Gait belt Activity Tolerance: Patient limited by pain Patient left: in chair;with call bell/phone within reach Nurse Communication: Patient requests pain meds;Mobility status         Time: 1430-1500 PT Time Calculation (min): 30 min   Charges:         PT G Codes:          Barbaraann Boys 05/10/2014, 4:21 PM  Van Clines, PT  Acute Rehabilitation Services Pager [phone] Office [phone]

## 2014-05-10 NOTE — Progress Notes (Signed)
  [facility] Progress Note   Subjective: No complaints  Filed Vitals:   05/09/14 1802 05/09/14 2024 05/10/14 0506 05/10/14 0900  BP: 108/75 103/69 142/90 117/75  Pulse: 91 90 92 92  Temp: 98.1 F (36.7 C) 97.6 F (36.4 C) 98.5 F (36.9 C) 98 F (36.7 C)  TempSrc: Oral Oral Oral   Resp: 20 18 18 18   Height:      Weight:      SpO2: 96% 96% 97% 97%   Exam: Alert, no distress No JVD Chest clear bilat No jvd RRR no MRG Abd obese, soft, NTND L AVF patent Neuro is nf, Ox 3  HD: [facility] 4h   107.5kg   2/2.0 Bath    Heparin 9800    L AVF  400/800 Calcitriol 2 ug TIW,  Aranesp 40 ug weekly, Venofer 50 mg weekly        Assessment: 1 Pulm edema / vol excess-  resolved 2 ESRD on HD 3 HTN/vol cont coreg, norvasc 4 Anemia, Hb up holding esa 5 HPTh cont binder and vit D 6 HIV 7 Social- looking for SNF  Plan- HD tuesday    Vinson Moselleob Romar Woodrick MD  pager [phone]    cell [phone]  05/10/2014, 4:22 PM     Recent Labs Lab 05/08/14 0700 05/08/14 0848 05/09/14 0300 05/09/14 1035  NA 137  --  139 134*  K 6.7* 4.6 4.6 4.7  CL 93*  --  97 94*  CO2 21  --  24 24  GLUCOSE 112*  --  124* 91  BUN 69*  --  39* 44*  CREATININE 12.44*  --  7.07* 8.15*  CALCIUM 9.4  --  7.8* 8.0*  PHOS  --   --   --  5.2*    Recent Labs Lab 05/09/14 1035  ALBUMIN 3.3*    Recent Labs Lab 05/05/14 0205 05/07/14 1234 05/08/14 0205 05/09/14 0300 05/09/14 1036  WBC 7.9 7.2 16.3* 6.1 6.3  NEUTROABS 4.1 3.5  --   --   --   HGB 10.7* 10.4* 12.7 10.0* 9.7*  HCT 33.1* 32.9* 39.5 31.5* 30.7*  MCV 107.8* 105.8* 107.3* 108.6* 106.2*  PLT 244 236 273 217 223   . amLODipine  10 mg Oral QHS  . aspirin EC  81 mg Oral Daily  . calcitRIOL  2 mcg Oral Q T,Th,Sa-HD  . carvedilol  3.125 mg Oral BID WC  . cinacalcet  90 mg Oral Q breakfast  . colchicine  0.6 mg Oral Daily  . [START ON 05/14/2014] ferric gluconate (FERRLECIT/NULECIT) IV  62.5 mg Intravenous Weekly  . FLUoxetine  20 mg  Oral Daily  . heparin  5,000 Units Subcutaneous 3 times per day  . hydrALAZINE  25 mg Oral 3 times per day  . isosorbide mononitrate  30 mg Oral Daily  . multivitamin  1 tablet Oral QHS  . senna-docusate  2 tablet Oral Daily  . sevelamer carbonate  2,400 mg Oral TID WC  . sodium chloride  3 mL Intravenous Q12H  . sodium chloride  3 mL Intravenous Q12H  . zolpidem  5 mg Oral Once     sodium chloride, acetaminophen, acetaminophen, alum & mag hydroxide-simeth, diphenhydrAMINE, fentaNYL, ipratropium-albuterol, LORazepam, ondansetron (ZOFRAN) IV, ondansetron, oxyCODONE-acetaminophen, polyethylene glycol, sodium chloride

## 2014-05-10 NOTE — Clinical Social Work Placement (Addendum)
Clinical Social Work Department CLINICAL SOCIAL WORK PLACEMENT NOTE 05/10/2014  Patient:  Jeralene HuffLSTON,Kalana ANN  Account Number:  [id number] Admit date:  05/08/2014  Clinical Social Worker:  Read DriversEGINA INGLE, LCSWA  Date/time:  05/10/2014 11:55 AM  Clinical Social Work is seeking post-discharge placement for this patient at the following level of care:   SKILLED NURSING   (*CSW will update this form in Epic as items are completed)   05/10/2014  Patient/family provided with [facility] Department of Clinical Social Work's list of facilities offering this level of care within the geographic area requested by the patient (or if unable, by the patient's family).  05/10/2014  Patient/family informed of their freedom to choose among providers that offer the needed level of care, that participate in Medicare, Medicaid or managed care program needed by the patient, have an available bed and are willing to accept the patient.  05/10/2014  Patient/family informed of [facility]' ownership interest in [facility], as well as of the fact that they are under no obligation to receive care at this facility.  PASARR submitted to EDS on  PASARR number received on   FL2 transmitted to all facilities in geographic area requested by pt/family on  05/10/2014 FL2 transmitted to all facilities within larger geographic area on   Patient informed that his/her managed care company has contracts with or will negotiate with  certain facilities, including the following:     Patient/family informed of bed offers received: 05/11/14  [facility] Physician recommends and patient chooses bed at    [facility]   Patient to be transferred to facility by ambulance Patient and family notified of transfer on 05/12/14 Name of family member notified: Valentina GuLucy May (aunt) and patient's son Raynald KempJimmy Farris. CSW asked for family/friends to contact  regading discharge and patient had already notified the persons named. She was on the phone with Ms. May when CSW entered room.  The following physician request were entered in Epic:  Additional Comments:  Vickii PennaGina Ingle, Theresia MajorsLCSWA [phone]  Clinical Social Work

## 2014-05-11 DIAGNOSIS — D631 Anemia in chronic kidney disease: Secondary | ICD-10-CM

## 2014-05-11 DIAGNOSIS — N039 Chronic nephritic syndrome with unspecified morphologic changes: Secondary | ICD-10-CM

## 2014-05-11 DIAGNOSIS — Z992 Dependence on renal dialysis: Secondary | ICD-10-CM

## 2014-05-11 MED ORDER — DARBEPOETIN ALFA-POLYSORBATE 40 MCG/0.4ML IJ SOLN
40.0000 ug | INTRAMUSCULAR | Status: DC
  Administered 2014-05-12: 40 ug via INTRAVENOUS
  Filled 2014-05-11: qty 0.4
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
## 2014-05-11 NOTE — Progress Notes (Signed)
[facility] Progress Note  Assessment/Plan: 1. Pulmonary edema - 2/2 missed HD - resolved; had HD 7/31 UF 3.8 and 8/1 UF 3.4- was 4 kg above edw per measures 8/2 but likely not standing  2. ESRD - TTS HD tomorrow  3. Anemia - Hgb 9.7 weekly ferrlicit - hasn't received any ESA ; held for isolated elevated Hgb of 12.7- dose Tuesday 4. Secondary hyperparathyroidism - calcitriol on 2 Ca bath - last corrected Ca was 8.5 - use 2.25 Ca bath - also on sensipar 90, though don't know if she takes regularly as an outpt 5. HTN/volume - volume ok by exam and BP; ? Accuracy of weights. 6. Nutrition - alb  Low - good appetite - ? Diet when not here due to unstable living situation 7.  Social situation/pt with hx substance abuse/psych - working on placement; she has had a myriad of living situations in [place], [place], [place], the past several years; also in an out of SNF, ALFs and group homes; on fluoxetine [place], PA-C [place] [phone] 05/11/2014,9:38 AM  LOS: 3 days   I have seen and examined this patient and agree with plan  As above, her current outpt center is [facility], and looking for SNF close by for placement.  Plan for HD tomorrow. Lamesha Tibbits B,MD 05/11/2014 2:29 PM  Subjective:   Wants me to call [employer] for her so she can get some money out to buy herself some slippers and gowns. Ate all of breakfast. Thinks she doesn't feel too good because she hasn't gotten her HIV meds. She tells me they made an appt for her for f/u after d/c to get her meds Tells me this appt is for the 5th and wants me to call and change it for her but she is not sure of time, who it is with Says she "pushed through the pain" to do her PT Objective Filed Vitals:   05/10/14 0506 05/10/14 0900 05/10/14 2100 05/11/14 0459  BP: 142/90 117/75 116/73 136/83  Pulse: 92 92 82 94  Temp: 98.5 F (36.9 C) 98 F (36.7 C) 98 F (36.7 C) 98.6 F (37 C)  TempSrc: Oral  Oral Oral   Resp: $Remo'18 18 18 18  'zberD$ Height:      Weight:   111.5 kg (245 lb 13 oz)   SpO2: 97% 97% 92% 98%   Physical Exam General: NAD obese eating Heart: RRR Lungs: no rales or wheezes Abdomen: obese soft Extremities: no LE edema Dialysis Access: left upper AVF + bruit/buttonholes intact  Dialysis Orders: TTS [facility] (current center - has been at a variety of centers the past 1-2 years) 4h 107.5kg 2/2.0 Bath Heparin 9800 L AVF 400/800  Calcitriol 2 ug TIW, Aranesp 40 ug weekly, Venofer 50 mg weekly  Additional Objective Labs: Basic Metabolic Panel:  Recent Labs Lab 05/08/14 0700 05/08/14 0848 05/09/14 0300 05/09/14 1035  NA 137  --  139 134*  K 6.7* 4.6 4.6 4.7  CL 93*  --  97 94*  CO2 21  --  24 24  GLUCOSE 112*  --  124* 91  BUN 69*  --  39* 44*  CREATININE 12.44*  --  7.07* 8.15*  CALCIUM 9.4  --  7.8* 8.0*  PHOS  --   --   --  5.2*   Liver Function Tests:  Recent Labs Lab 05/09/14 1035  ALBUMIN 3.3*   CBC:  Recent Labs Lab 05/05/14 0205 05/07/14 1234 05/08/14  0205 05/09/14 0300 05/09/14 1036  WBC 7.9 7.2 16.3* 6.1 6.3  NEUTROABS 4.1 3.5  --   --   --   HGB 10.7* 10.4* 12.7 10.0* 9.7*  HCT 33.1* 32.9* 39.5 31.5* 30.7*  MCV 107.8* 105.8* 107.3* 108.6* 106.2*  PLT 244 236 273 217 223  Cardiac Enzymes:  Recent Labs Lab 05/08/14 0205  TROPONINI <0.30   CBG:  Recent Labs Lab 05/08/14 0023  GLUCAP 207*  Medications:   . amLODipine  10 mg Oral QHS  . aspirin EC  81 mg Oral Daily  . calcitRIOL  2 mcg Oral Q T,Th,Sa-HD  . carvedilol  3.125 mg Oral BID WC  . cinacalcet  90 mg Oral Q breakfast  . colchicine  0.6 mg Oral Daily  . [START ON 05/14/2014] ferric gluconate (FERRLECIT/NULECIT) IV  62.5 mg Intravenous Weekly  . FLUoxetine  20 mg Oral Daily  . heparin  5,000 Units Subcutaneous 3 times per day  . hydrALAZINE  25 mg Oral 3 times per day  . isosorbide mononitrate  30 mg Oral Daily  . multivitamin  1 tablet Oral QHS  . senna-docusate  2 tablet Oral Daily   . sevelamer carbonate  2,400 mg Oral TID WC  . sodium chloride  3 mL Intravenous Q12H  . sodium chloride  3 mL Intravenous Q12H  . zolpidem  5 mg Oral Once

## 2014-05-11 NOTE — Progress Notes (Signed)
Physical Therapy Treatment Patient Details Name: Jean CooleyMargaret Ann [name] MRN: [id number] DOB: 08/22/1951 Today's Date: 05/11/2014    History of Present Illness This 63 y.o. female presented to ED with SOB and mild swelling.  She missed her HD apt and was found to be hyperkalemic.  She was admitted for HD.  PMH includes HTN, CHF; HIV+; ESRD; depression/anxiety; h/o polysubstance abuse, arthritis hips and knees.    PT Comments    Pt willing to participate in treatment and work through her pain in order to increase functional mobility, stating she has been depressed due to her decreased mobility. Pt was nauseous at beginning of session but reported feeling better after completing transfers and ambulation. PT session safely completed on room air. Pt stated that she has 'quit cigarettes'. Pt seems to be making progress and will benefit from continued PT treatment in order to regain functional independence and mobility.  Follow Up Recommendations  SNF     Equipment Recommendations  Rolling walker with 5" wheels    Recommendations for Other Services       Precautions / Restrictions Precautions Precautions: Fall Restrictions Weight Bearing Restrictions: No    Mobility  Bed Mobility Overal bed mobility: Needs Assistance (Assistance needed for rolling and repo of legs) Bed Mobility: Rolling;Sidelying to Sit Rolling: Min assist Sidelying to sit: Min guard (Pt able to use arms and legs to self assist)       General bed mobility comments: +1 for helping to move legs and roll to sidelying  Transfers Overall transfer level: Needs assistance (primarily with sit to stand and supine to sidelying) Equipment used: Rolling walker (2 wheeled) (Pt required verbal cues for hand placement with sit to stand) Transfers: Sit to/from Stand (+1 mod assist)          Lateral/Scoot Transfers: Min assist (Pt able to weight shift and use arms to scoot) General transfer comment: +1 mod assist for transfers,  Pt required verbal cueing for safety (Pt used bed rail and verbal cues for sidelying to sit)  Ambulation/Gait Ambulation/Gait assistance: Min guard Ambulation Distance (Feet): 10 Feet (5'x2 with seated rest break in between) Assistive device: Rolling walker (2 wheeled) Gait Pattern/deviations: Step-to pattern;Antalgic;Trunk flexed (Posture, ROM, and endurance limited by arthritic pain)    Kept chair close behind for safety     Stairs            Wheelchair Mobility    Modified Rankin (Stroke Patients Only)       Balance Overall balance assessment: Modified Independent sitting balance  Requires UE support for standing balance                                  Cognition Arousal/Alertness: Awake/alert Behavior During Therapy: WFL for tasks assessed/performed (Pt pleasant and willing to work to achieve goals) Overall Cognitive Status: Within Functional Limits for tasks assessed                      Exercises General Exercises - Lower Extremity Heel Slides: Both;5 reps;AAROM;Supine;Limitations (Limited bilaterally by arthritic knee pain, PT doing </= 20% of work) Bilateral Gentle hip Rhythmic Rotation in hooklying    General Comments General comments (skin integrity, edema, etc.): Bilateral knee edema, required cueing for safety as Pt was anxious to stand and ambulate      Pertinent Vitals/Pain Pain 8/10 at beginning of session Pt left in chair and position for comfort. Checked  with nursing and pain meds will be delivered as scheduled.  SpO2: 98% at end of session, on Room Air    Home Living                      Prior Function            PT Goals (current goals can now be found in the care plan section) Acute Rehab PT Goals Patient Stated Goal: To walk and 'get better' Progress towards PT goals: Progressing toward goals (Pt will benefit from continued treatment)    Frequency  Min 3X/week    PT Plan Current plan remains  appropriate    Co-evaluation             End of Session Equipment Utilized During Treatment: Gait belt Activity Tolerance: Patient limited by pain (Pt willing to work through pain in order to improve) Patient left: in chair;with call bell/phone within reach (Pt's left with lunch on tray)     Time: 6962-9528 PT Time Calculation (min): 22 min  Charges:                       G CodesIris Pert, SPT 05/11/2014, 2:22 PM  Van Clines, PT  Acute Rehabilitation Services Pager [phone] Office [phone]

## 2014-05-12 LAB — RENAL FUNCTION PANEL
Albumin: 3.5 g/dL (ref 3.5–5.2)
Anion gap: 20 — ABNORMAL HIGH (ref 5–15)
BUN: 72 mg/dL — ABNORMAL HIGH (ref 6–23)
CO2: 24 mEq/L (ref 19–32)
Calcium: 8.4 mg/dL (ref 8.4–10.5)
Chloride: 89 mEq/L — ABNORMAL LOW (ref 96–112)
Creatinine, Ser: 10.68 mg/dL — ABNORMAL HIGH (ref 0.50–1.10)
GFR calc Af Amer: 4 mL/min — ABNORMAL LOW (ref >90)
GFR calc non Af Amer: 3 mL/min — ABNORMAL LOW (ref >90)
Glucose, Bld: 93 mg/dL (ref 70–99)
Phosphorus: 6 mg/dL — ABNORMAL HIGH (ref 2.3–4.6)
Potassium: 5.3 mEq/L (ref 3.7–5.3)
Sodium: 133 mEq/L — ABNORMAL LOW (ref 137–147)

## 2014-05-12 LAB — CBC
HCT: 30.8 % — ABNORMAL LOW (ref 36.0–46.0)
Hemoglobin: 10.1 g/dL — ABNORMAL LOW (ref 12.0–15.0)
MCH: 34.2 pg — ABNORMAL HIGH (ref 26.0–34.0)
MCHC: 32.8 g/dL (ref 30.0–36.0)
MCV: 104.4 fL — ABNORMAL HIGH (ref 78.0–100.0)
Platelets: 269 10*3/uL (ref 150–400)
RBC: 2.95 MIL/uL — ABNORMAL LOW (ref 3.87–5.11)
RDW: 13.7 % (ref 11.5–15.5)
WBC: 6.3 10*3/uL (ref 4.0–10.5)

## 2014-05-12 MED ORDER — DARBEPOETIN ALFA-POLYSORBATE 40 MCG/0.4ML IJ SOLN
INTRAMUSCULAR | Status: AC
  Filled 2014-05-12: qty 0.4

## 2014-05-12 MED ORDER — HEPARIN SODIUM (PORCINE) 1000 UNIT/ML DIALYSIS
20.0000 [IU]/kg | INTRAMUSCULAR | Status: DC | PRN
  Filled 2014-05-12: qty 3

## 2014-05-12 MED ORDER — AMLODIPINE BESYLATE 5 MG PO TABS
10.0000 mg | ORAL_TABLET | Freq: Every day | ORAL | Status: DC
Start: 2014-05-12 — End: 2014-06-29

## 2014-05-12 MED ORDER — LABETALOL HCL 100 MG PO TABS: 100.0000 mg | ORAL_TABLET | Freq: Two times a day (BID) | ORAL | Status: DC

## 2014-05-12 MED ORDER — ASPIRIN EC 81 MG PO TBEC: 81.0000 mg | DELAYED_RELEASE_TABLET | Freq: Every day | ORAL | Status: DC

## 2014-05-12 MED ORDER — ISOSORBIDE MONONITRATE ER 30 MG PO TB24: 30.0000 mg | ORAL_TABLET | Freq: Every day | ORAL | Status: DC

## 2014-05-12 MED ORDER — NEPRO/CARBSTEADY PO LIQD: 237.0000 mL | ORAL | Status: DC | PRN

## 2014-05-12 MED ORDER — LIDOCAINE-PRILOCAINE 2.5-2.5 % EX CREA: 1.0000 "application " | TOPICAL_CREAM | CUTANEOUS | Status: DC | PRN

## 2014-05-12 MED ORDER — ALTEPLASE 2 MG IJ SOLR
2.0000 mg | Freq: Once | INTRAMUSCULAR | Status: DC | PRN
  Filled 2014-05-12: qty 2

## 2014-05-12 MED ORDER — RENA-VITE PO TABS: 1.0000 | ORAL_TABLET | Freq: Every day | ORAL | Status: DC

## 2014-05-12 MED ORDER — IPRATROPIUM-ALBUTEROL 0.5-2.5 (3) MG/3ML IN SOLN: 3.0000 mL | Freq: Four times a day (QID) | RESPIRATORY_TRACT | Status: DC | PRN

## 2014-05-12 MED ORDER — SODIUM CHLORIDE 0.9 % IV SOLN: 100.0000 mL | INTRAVENOUS | Status: DC | PRN

## 2014-05-12 MED ORDER — LIDOCAINE HCL (PF) 1 % IJ SOLN: 5.0000 mL | INTRAMUSCULAR | Status: DC | PRN

## 2014-05-12 MED ORDER — PENTAFLUOROPROP-TETRAFLUOROETH EX AERO: 1.0000 "application " | INHALATION_SPRAY | CUTANEOUS | Status: DC | PRN

## 2014-05-12 MED ORDER — POLYETHYLENE GLYCOL 3350 17 G PO PACK: 17.0000 g | PACK | Freq: Every day | ORAL | Status: DC | PRN

## 2014-05-12 MED ORDER — SENNOSIDES-DOCUSATE SODIUM 8.6-50 MG PO TABS: 2.0000 | ORAL_TABLET | Freq: Every day | ORAL | Status: DC

## 2014-05-12 MED ORDER — FLUOXETINE HCL 20 MG PO CAPS: 20.0000 mg | ORAL_CAPSULE | Freq: Every day | ORAL | Status: DC

## 2014-05-12 MED ORDER — CALCITRIOL 0.5 MCG PO CAPS: 2.0000 ug | ORAL_CAPSULE | ORAL | Status: DC

## 2014-05-12 MED ORDER — SEVELAMER CARBONATE 800 MG PO TABS: 2400.0000 mg | ORAL_TABLET | Freq: Three times a day (TID) | ORAL | Status: DC

## 2014-05-12 MED ORDER — LORAZEPAM 0.5 MG PO TABS: 0.5000 mg | ORAL_TABLET | Freq: Every day | ORAL | Status: DC | PRN

## 2014-05-12 MED ORDER — HEPARIN SODIUM (PORCINE) 1000 UNIT/ML DIALYSIS
1000.0000 [IU] | INTRAMUSCULAR | Status: DC | PRN
  Filled 2014-05-12: qty 1

## 2014-05-12 NOTE — Progress Notes (Signed)
Call[facility] to [facility].

## 2014-05-12 NOTE — Clinical Social Work Psychosocial (Signed)
Clinical Social Work Department BRIEF PSYCHOSOCIAL ASSESSMENT 05/12/2014  Patient:  [name],[name]     Account Number:  [id number]     Admit date:  05/08/2014  Clinical Social Worker:  Delmer IslamRAWFORD,Shinichi Anguiano, LCSW  Date/Time:  05/12/2014 02:25 AM  Referred by:  Physician  Date Referred:  05/08/2014  Other Referral:   Interview type:  Patient Other interview type:    PSYCHOSOCIAL DATA Living Status:  FAMILY Admitted from facility:   Level of care:   Primary support name:   Primary support relationship to patient:   Degree of support available:   Patient has family, but only reports problems with various family members.    CURRENT CONCERNS Current Concerns  Post-Acute Placement   Other Concerns:    SOCIAL WORK ASSESSMENT / PLAN Nurse case manager talked with patient on 7/31 and CSW followed up with patient on 8/3 regarding discharge plans and SNF placement. Patient [place] living with [place] niece [place] and reported that this arrnagmeent was not working out due to the people and things that were occurring at the home. Patient requested SNF placement and CSW provided patient with [facility]. Patient agreeable to facility.    Contact made with facility regarding dialysis transportation for patient and any cost involved. CSW informed that because patient has Medicaid, she will not have to pay to be transported to HD or medical appointments.   Assessment/plan status:  No Further Intervention Required Other assessment/ plan:   Information/referral to community resources:   None needed or requested at this time.    PATIENT'S/FAMILY'S RESPONSE TO PLAN OF CARE: Patient requested SNF placement and [facility].

## 2014-05-12 NOTE — Care Management Note (Signed)
CARE MANAGEMENT NOTE 05/12/2014  Patient:  [name],[name]   Account Number:  [id number]  Date Initiated:  05/12/2014  Documentation initiated by:  Katiejo Gilroy  Subjective/Objective Assessment:   CM following for progression and d/c planning.     Action/Plan:   Pt for d/c to SNF for short term rehab.   Anticipated DC Date:  05/12/2014   Anticipated DC Plan:  SKILLED NURSING FACILITY         Choice offered to / List presented to:             Status of service:  Completed, signed off Medicare Important Message given?  YES (If response is "NO", the following Medicare IM given date fields will be blank) Date Medicare IM given:  05/12/2014 Medicare IM given by:  Niurka Benecke Date Additional Medicare IM given:   Additional Medicare IM given by:    Discharge Disposition:  SKILLED NURSING FACILITY  Per UR Regulation:    If discussed at Long Length of Stay Meetings, dates discussed:    Comments:

## 2014-05-12 NOTE — Progress Notes (Signed)
[facility] Progress Note  Assessment/Plan: . Pulmonary edema - 2/2 missed HD - resolved; had HD 7/31 UF 3.8 and 8/1 UF 3.4- was 4 kg above edw per measures 8/2 but likely not standing  2. ESRD - TTS HD today - check labs pre HD 3. Anemia - Hgb 9.7 weekly ferrlicit - hasn't received any ESA ; held for isolated elevated Hgb of 12.7- dose Tuesday  4. Secondary hyperparathyroidism - calcitriol on 2 Ca bath - last corrected Ca was 8.5 - use 2.25 Ca bath - also on sensipar 90, though don't know if she takes regularly as an outpt  5. HTN/volume - BP ok; weights variable - wt Monday < Sunday wt and didn't have HD 6. Nutrition - alb Low - good appetite - ? Diet when not here due to unstable living situation  7. Social situation/pt with hx substance abuse/psych - working on placement; she has had a myriad of living situations in [place], [place], [place], the past several years; also in an out of SNF, ALFs and group homes; on fluoxetine 20; given past history I predict she will stay wherever she is placed for a short time and then find a reason to leave.  Myriam Jacobson, PA-C [place] 05/12/2014,8:00 AM  LOS: 4 days   I have seen and examined this patient and agree with documentation of [facility], [place].  Pt will be discharged post dialysis to [facility]. She will resume HD at [facility] on [address].  Primary service will be changing her carvedilol to labetolol given her history of active cocaine use (would hope this would not occur in a SNF situation, but if/when leaves SNF as has been her pattern would rather not be using carvedilol for BP when she is at risk for using in the future)  Jakel Alphin B,MD 05/12/2014 1:59 PM  Subjective:   Didn't want to go to HD this am until she ate breakfast. Worried about missing HIV clinic appt tomorrow.  PT told her she did "excellent" yesterday  Objective Filed Vitals:   05/11/14 1801 05/11/14 2003 05/11/14  2100 05/12/14 0444  BP: 144/75 118/78  122/75  Pulse: 86 87  81  Temp: 98.5 F (36.9 C) 98.4 F (36.9 C)  98.4 F (36.9 C)  TempSrc: Oral     Resp: $Remo'17 18  19  'hipoy$ Height:      Weight:   109.317 kg (241 lb)   SpO2: 97% 92%  96%   Physical Exam General: NAD talkatative Heart: RRR Lungs: no wheezes/crackles Abdomen: obese soft Extremities: no sig edema Access: left AVF + buttonholes - patent  Dialysis Orders: TTS [facility] (current center - has been at a variety of centers the past 1-2 years)  4h 107.5kg 2/2.0 Bath Heparin 9800 L AVF 400/800  Calcitriol 2 ug TIW, Aranesp 40 ug weekly, Venofer 50 mg weekly   Additional Objective Labs: Basic Metabolic Panel:  Recent Labs Lab 05/08/14 0700 05/08/14 0848 05/09/14 0300 05/09/14 1035  NA 137  --  139 134*  K 6.7* 4.6 4.6 4.7  CL 93*  --  97 94*  CO2 21  --  24 24  GLUCOSE 112*  --  124* 91  BUN 69*  --  39* 44*  CREATININE 12.44*  --  7.07* 8.15*  CALCIUM 9.4  --  7.8* 8.0*  PHOS  --   --   --  5.2*   Liver Function Tests:  Recent Labs Lab 05/09/14 1035  ALBUMIN 3.3*   CBC:  Recent Labs Lab 05/07/14 1234 05/08/14 0205 05/09/14 0300 05/09/14 1036  WBC 7.2 16.3* 6.1 6.3  NEUTROABS 3.5  --   --   --   HGB 10.4* 12.7 10.0* 9.7*  HCT 32.9* 39.5 31.5* 30.7*  MCV 105.8* 107.3* 108.6* 106.2*  PLT 236 273 217 223    Cardiac Enzymes:  Recent Labs Lab 05/08/14 0205  TROPONINI <0.30   CBG:  Recent Labs Lab 05/08/14 0023  GLUCAP 207*  Medications:   . amLODipine  10 mg Oral QHS  . aspirin EC  81 mg Oral Daily  . calcitRIOL  2 mcg Oral Q T,Th,Sa-HD  . carvedilol  3.125 mg Oral BID WC  . cinacalcet  90 mg Oral Q breakfast  . colchicine  0.6 mg Oral Daily  . darbepoetin (ARANESP) injection - DIALYSIS  40 mcg Intravenous Q Tue-HD  . [START ON 05/14/2014] ferric gluconate (FERRLECIT/NULECIT) IV  62.5 mg Intravenous Weekly  . FLUoxetine  20 mg Oral Daily  . heparin  5,000 Units Subcutaneous 3 times per day  .  hydrALAZINE  25 mg Oral 3 times per day  . isosorbide mononitrate  30 mg Oral Daily  . multivitamin  1 tablet Oral QHS  . senna-docusate  2 tablet Oral Daily  . sevelamer carbonate  2,400 mg Oral TID WC  . sodium chloride  3 mL Intravenous Q12H  . sodium chloride  3 mL Intravenous Q12H  . zolpidem  5 mg Oral Once

## 2014-05-12 NOTE — Progress Notes (Signed)
  Patient Dis[facility]:  [facility]  Education: educated patient on discharge, discharge medications, fluid overload signs and symptoms.  Patient verbalized understanding  IV: removed.  Clean, dry and intact  Prescriptions:  Hard copy of percocet script sent in packet to SNF  Transportation:  ambulance  Belongings: gathered by patient, verified by RN

## 2014-05-12 NOTE — Progress Notes (Signed)
Telemetry box 23 removed and returned.  CCMD notified.   

## 2014-05-12 NOTE — Discharge Summary (Signed)
Physician Discharge Summary  [name] FRK:[id number] DOB: 07/05/1951 DOA: 05/08/2014  PCP: Dorrene German, MD  Admit date: 05/08/2014 Discharge date: 05/12/2014  Time spent: greater than 30 minutes  Recommendations for Outpatient Follow-up:  1. Dr. Staci Righter, Infectious Disease on 05/13/14 at 1:45 PM. 2. [facility] on [address]: Hemodialysis appointments on Tu/Thurs/Sats. 3. MD at SNF in 2 days, for post hospital discharge FU. 4. Dr. Fleet Contras, PCP upon DC from SNF.  Discharge Diagnoses:  Principal Problem:   Pulmonary edema Active Problems:   HIV infection   Hypertension   ESRD (end stage renal disease) on dialysis   Anemia secondary to renal failure   Anxiety state   Crack cocaine use   Dyspnea   Respiratory failure   Discharge Condition: Improved & Stable  Diet recommendation: Heart Healthy diet.  Filed Weights   05/10/14 2100 05/11/14 2100 05/12/14 1030  Weight: 111.5 kg (245 lb 13 oz) 109.317 kg (241 lb) 115.5 kg (254 lb 10.1 oz)    History of present illness:  [name] is a 63 y.o. female with a past medical history of end-stage renal disease on hemodialysis on Tuesdays Thursdays and Saturdays, HIV positive, chronic combined congestive heart failure, osteoarthritis, polysubstance abuse, noncompliance, presented to the ED with complaints of worsening shortness of breath.  Hospital Course:   1. Acute respiratory failure: Secondary to pulmonary edema from missed hemodialysis. Resolved after hemodialysis. 2. Acute on chronic combined systolic and diastolic CHF: Precipitated by missed hemodialysis appointments. Improved after hemodialysis. Patient has been counseled extend regarding importance of compliance with medications, keeping up hemodialysis appointments, M.D. followups and abstinence from substance abuse. She verbalized understanding. Last 2-D echo on 04/16/14 showed LVEF 35-40% and grade 1 diastolic dysfunction. Continue  hydralazine, nitrates. Discontinued carvedilol secondary to concern for cocaine abuse (UDS positive for cocaine in March 2015). Will substitute carvedilol with labetalol which will provide both alpha and beta blockade. 3. ESRD on TTS hemodialysis/hyperkalemia: Nephrology was consulted. Patient underwent multiple cycles of hemodialysis. Today's potassium of 5.3 was prior to hemodialysis. Nephrology has cleared her for discharge to SNF and patient is to present to the kidney center above for regular dialysis. Periodic CBC and BMP checks across hemodialysis. 4. HIV-positive: Undetectable viral load on 10/24/13. Patient not compliant with medications over the last 1-2 months. She exhibits poor insight and is at high risk for developing resistance. Her case was discussed with infectious disease M.D. who recommended holding all her HIV medications for now and followup with infectious disease M.D. on 05/13/14. 5. History of polysubstance abuse: Cessation counseled. 6. Hypertension: Reasonably controlled. Continue amlodipine, Imdur, hydralazine. Carvedilol discontinued secondary to concern for cocaine abuse. Labetalol will be started new 100 mg by mouth twice a day. Medications can be titrated outpatient as needed. 7. Anemia: stable. Management outpatient as per nephrology 8. Social issues: Patient reports unstable home environment, myriad of living situations in [place], [place] and [place] city in the past several years and also in and out of SNF, ALF in group homes. Currently agreeable to SNF. 9. Constipation: bowel regimen.  Consultations:   Nephrology  Procedures:   Hemodialysis    Discharge Exam:  Complaints:  "I feel much better". Denies pain in right forearm of lower extremities. Constipation.  Filed Vitals:   05/12/14 1200 05/12/14 1230 05/12/14 1300 05/12/14 1330  BP: 137/67 114/58 148/73 114/68  Pulse: 87 96 94 95  Temp:      TempSrc:      Resp: 20  $'18 20 18  'a$ Height:      Weight:       SpO2:      Temperature 98.88F and oxygen saturation 98% on room air.  General: No apparent distress  Cardiovascular: Regular rate and rhythm. Telemetry: Sinus rhythm.  Respiratory: No crackles, no wheezing, good air movement  Abdomen: Soft, nontender  Neuro: Nonfocal  Extremities: Right forearm peripheral IV access appears intact without any acute findings except mild tenderness distally- improved   Discharge Instructions      Discharge Instructions   (HEART FAILURE PATIENTS) Call MD:  Anytime you have any of the following symptoms: 1) 3 pound weight gain in 24 hours or 5 pounds in 1 week 2) shortness of breath, with or without a dry hacking cough 3) swelling in the hands, feet or stomach 4) if you have to sleep on extra pillows at night in order to breathe.    Complete by:  As directed      Call MD for:  difficulty breathing, headache or visual disturbances    Complete by:  As directed      Diet - low sodium heart healthy    Complete by:  As directed      Increase activity slowly    Complete by:  As directed             Medication List    STOP taking these medications       carvedilol 3.125 MG tablet  Commonly known as:  COREG     lamiVUDine 10 MG/ML solution  Commonly known as:  EPIVIR     raltegravir 400 MG tablet  Commonly known as:  ISENTRESS      TAKE these medications       amLODipine 5 MG tablet  Commonly known as:  NORVASC  Take 2 tablets (10 mg total) by mouth at bedtime.     aspirin EC 81 MG tablet  Take 1 tablet (81 mg total) by mouth daily.     calcitRIOL 0.5 MCG capsule  Commonly known as:  ROCALTROL  Take 4 capsules (2 mcg total) by mouth Every Tuesday,Thursday,and Saturday with dialysis.     cinacalcet 90 MG tablet  Commonly known as:  SENSIPAR  Take 90 mg by mouth daily.     colchicine 0.6 MG tablet  Take 0.6 mg by mouth daily.     diphenhydrAMINE 50 MG capsule  Commonly known as:  BENADRYL  Take 50 mg by mouth at bedtime as needed  for sleep.     feeding supplement (NEPRO CARB STEADY) Liqd  Take 237 mLs by mouth as needed (missed meal during dialysis.).     FLUoxetine 20 MG capsule  Commonly known as:  PROZAC  Take 1 capsule (20 mg total) by mouth daily.     hydrALAZINE 25 MG tablet  Commonly known as:  APRESOLINE  Take 1 tablet (25 mg total) by mouth every 8 (eight) hours.     ipratropium-albuterol 0.5-2.5 (3) MG/3ML Soln  Commonly known as:  DUONEB  Take 3 mLs by nebulization every 6 (six) hours as needed (Dyspnea or wheezing.).     isosorbide mononitrate 30 MG 24 hr tablet  Commonly known as:  IMDUR  Take 1 tablet (30 mg total) by mouth daily.     labetalol 100 MG tablet  Commonly known as:  NORMODYNE  Take 1 tablet (100 mg total) by mouth 2 (two) times daily.     LORazepam 0.5 MG tablet  Commonly known as:  ATIVAN  Take 1 tablet (0.5 mg total) by mouth daily as needed for anxiety.     multivitamin Tabs tablet  Take 1 tablet by mouth daily.     omeprazole 20 MG capsule  Commonly known as:  PRILOSEC  Take 20 mg by mouth daily.     polyethylene glycol packet  Commonly known as:  MIRALAX / GLYCOLAX  Take 17 g by mouth daily as needed for mild constipation.     senna-docusate 8.6-50 MG per tablet  Commonly known as:  Senokot-S  Take 2 tablets by mouth daily.     sevelamer carbonate 800 MG tablet  Commonly known as:  RENVELA  Take 3 tablets (2,400 mg total) by mouth 3 (three) times daily with meals.       Follow-up Information   Follow up with Scharlene Gloss, MD On 05/13/2014. (at 1:45 PM.)    Specialty:  Infectious Diseases   Contact information:   [address] [address] [phone]       Follow up with [facility] on [facility]. ([place] regular hemodialysis appointments on Tues/Thurs/Sat's)       Follow up with MD at SNF. Schedule an appointment as soon as possible for a visit in 2 days. (Post hospital discharge follow up.)       Follow up with  Philis Fendt, MD. (Post discharge from SNF.)    Specialty:  Internal Medicine   Contact information:   [address] [address] [phone]        The results of significant diagnostics from this hospitalization (including imaging, microbiology, ancillary and laboratory) are listed below for reference.    Significant Diagnostic Studies: Dg Chest 2 View  05/09/2014   CLINICAL DATA:  Short of breath  EXAM: CHEST  2 VIEW  COMPARISON:  Prior chest x-ray 05/08/2014  FINDINGS: Cardiomegaly. Minimal vascular congestion without overt edema. There is been significant interval improvement in diffuse bilateral airspace opacities since the prior radiograph. No pneumothorax, pleural effusion or focal airspace consolidation. No acute osseous abnormality.  IMPRESSION: 1. Marked improvement in pulmonary aeration consistent with nearly completely resolved pulmonary edema. There may be trace residual vascular congestion. 2. Cardiomegaly.   Electronically Signed   By: Jacqulynn Cadet M.D.   On: 05/09/2014 07:15   Dg Chest 2 View  05/02/2014   CLINICAL DATA:  Cough, congestion and shortness of breath. Chest pain.  EXAM: CHEST  2 VIEW  COMPARISON:  Chest radiograph performed 04/30/2014  FINDINGS: The lungs are well-aerated. Vascular congestion is noted. Increased interstitial markings raise concern for mild residual pulmonary edema. No pleural effusion or pneumothorax is seen.  The heart is borderline normal in size. There appears to be gradually increasing prominence of the superior mediastinum, on comparison with multiple studies over the past few years. This could reflect thyroid goiter; would consider CT of the chest for further evaluation, on an elective nonemergent basis. No acute osseous abnormalities are seen.  IMPRESSION: 1. Vascular congestion noted. Increased interstitial markings raise concern for mild residual pulmonary edema. 2. Gradually increasing prominence of the superior  mediastinum, over the past few years. This could reflect thyroid goiter. Would consider CT of the chest for further evaluation, on an elective nonemergent basis.   Electronically Signed   By: Garald Balding M.D.   On: 05/02/2014 00:50   Dg Chest 2 View  04/30/2014   CLINICAL DATA:  Chest pain.  EXAM: CHEST  2 VIEW  COMPARISON:  04/21/2014.  FINDINGS: Mediastinum and hilar structures are normal. Perihilar atelectasis present. Cardiomegaly is present. Previously identified pulmonary interstitial edema has partially cleared. No pleural effusion or pneumothorax.  IMPRESSION: 1.  Interim partial clearing of congestive heart failure.  2.  Perihilar subsegmental atelectasis.   Electronically Signed   By: Maisie Fus  Register   On: 04/30/2014 08:55   Dg Chest 2 View  04/21/2014   CLINICAL DATA:  Shortness of breath  EXAM: CHEST  2 VIEW  COMPARISON:  Prior radiograph from 04/19/2014  FINDINGS: Mild cardiomegaly is stable from prior study.  Lungs are mildly hypoinflated. There is diffuse pulmonary vascular congestion with indistinctness of the interstitial markings, compatible with mild to moderate diffuse pulmonary edema. No definite pleural effusion. No focal infiltrates identified. No pneumothorax.  No acute osseus abnormality.  IMPRESSION: Findings consistent with mild to moderate diffuse pulmonary edema.   Electronically Signed   By: Rise Mu M.D.   On: 04/21/2014 05:48   Dg Chest 2 View  04/15/2014   CLINICAL DATA:  Shortness of breath.  Smoker.  EXAM: CHEST  2 VIEW  COMPARISON:  04/11/2014  FINDINGS: Shallow inspiration. The heart size and mediastinal contours are within normal limits. Both lungs are clear. The visualized skeletal structures are unremarkable.  IMPRESSION: No active cardiopulmonary disease.   Electronically Signed   By: Burman Nieves M.D.   On: 04/15/2014 05:17   Dg Pelvis 1-2 Views  05/02/2014   CLINICAL DATA:  Right hip pain.  No known injury.  EXAM: PELVIS - 1-2 VIEW   COMPARISON:  Prior CT from 01/17/2014  FINDINGS: No acute fracture or dislocation. Femoral heads are normally aligned within the acetabula. From old head heights are preserved.  Moderate degenerative osteoarthrosis as evidenced by joint space narrowing, subchondral sclerosis, and juxta-articular osteophytosis seen about both hips, right worse than left.  Bony pelvis is intact. SI joints are approximated. No pubic dye stasis.  No soft tissue abnormality.  IMPRESSION: 1. 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 2. No acute abnormality within the pelvis.   Electronically Signed   By: Rise Mu M.D.   On: 05/02/2014 00:48   Dg Knee 2 Views Right  05/05/2014   CLINICAL DATA:  Progressive knee pain.  EXAM: RIGHT KNEE - 1-2 VIEW  COMPARISON:  07/31/2013  FINDINGS: The patient has severe arthritis of the lateral compartment of the right knee with cortical erosions and joint space narrowing and marginal osteophyte formation. There is less severe patellofemoral and medial compartment osteoarthritis. There is only a tiny joint effusion.  IMPRESSION: No acute abnormality. Progressive severe osteoarthritis the lateral compartment.   Electronically Signed   By: Geanie Cooley M.D.   On: 05/05/2014 02:29   Dg Chest Port 1 View  05/08/2014   CLINICAL DATA:  Shortness of breath.  EXAM: PORTABLE CHEST - 1 VIEW  COMPARISON:  05/07/2014  FINDINGS: Shallow inspiration. Cardiac enlargement with pulmonary vascular congestion. Prominent bilateral pulmonary infiltrates, increasing since previous study. This could be due to edema or bilateral pneumonia. No blunting of costophrenic angles. No pneumothorax. Old resection or resorption of the distal right clavicle.  IMPRESSION: Interval development of severe bilateral pulmonary infiltrates suggesting edema or bilateral pneumonia.   Electronically Signed   By: Burman Nieves M.D.   On: 05/08/2014 01:01   Dg Chest Portable 1  View  05/07/2014   CLINICAL DATA:  Cough; dialysis dependent renal failure  EXAM: PORTABLE CHEST - 1 VIEW  COMPARISON:  PA and  lateral chest of May 02, 2014  FINDINGS: The lungs are adequately inflated. The interstitial markings are increased in the pulmonary vascularity is engorged. There is stable scarring in the mid lungs bilaterally. The cardiac silhouette is mildly enlarged. There is no pleural effusion.  IMPRESSION: Pulmonary interstitial edema of cardiac or noncardiac cause. There is no pneumonia.   Electronically Signed   By: David  Martinique   On: 05/07/2014 13:46   Dg Chest Portable 1 View  04/19/2014   CLINICAL DATA:  Chest pain, shortness of breath.  EXAM: PORTABLE CHEST - 1 VIEW  COMPARISON:  Chest radiograph April 15, 2014  FINDINGS: Cardiac silhouette is unremarkable and unchanged. Mildly calcified aortic knob. No pleural effusions. No focal consolidation. Similar left midlung zone scarring. No pneumothorax.  Soft tissue planes and included osseous structures are nonsuspicious. Multiple EKG lines overlie the patient and may obscure subtle underlying pathology.  IMPRESSION: No acute cardiopulmonary process.   Electronically Signed   By: Elon Alas   On: 04/19/2014 04:14   Dg Shoulder Left  04/19/2014   CLINICAL DATA:  Painful left shoulder  EXAM: LEFT SHOULDER - 2+ VIEW  COMPARISON:  None.  FINDINGS: Glenohumeral joint is intact. No evidence of scapular fracture or humeral fracture. The acromioclavicular joint is intact.  IMPRESSION: No acute osseous abnormality.   Electronically Signed   By: Suzy Bouchard M.D.   On: 04/19/2014 07:46    Microbiology: Recent Results (from the past 240 hour(s))  MRSA PCR SCREENING     Status: None   Collection Time    05/08/14  4:32 PM      Result Value Ref Range Status   MRSA by PCR NEGATIVE  NEGATIVE Final   Comment:            The GeneXpert MRSA Assay (FDA     approved for NASAL specimens     only), is one component of a     comprehensive  MRSA colonization     surveillance program. It is not     intended to diagnose MRSA     infection nor to guide or     monitor treatment for     MRSA infections.     Labs: Basic Metabolic Panel:  Recent Labs Lab 05/07/14 1234 05/08/14 0205 05/08/14 0700 05/08/14 0848 05/09/14 0300 05/09/14 1035 05/12/14 1109  NA 143  --  137  --  139 134* 133*  K 5.7*  --  6.7* 4.6 4.6 4.7 5.3  CL 98  --  93*  --  97 94* 89*  CO2 24  --  21  --  $R'24 24 24  'oT$ GLUCOSE 93  --  112*  --  124* 91 93  BUN 53*  --  69*  --  39* 44* 72*  CREATININE 11.41*  --  12.44*  --  7.07* 8.15* 10.68*  CALCIUM 9.3  --  9.4  --  7.8* 8.0* 8.4  MG  --  2.2  --   --   --   --   --   PHOS  --   --   --   --   --  5.2* 6.0*   Liver Function Tests:  Recent Labs Lab 05/09/14 1035 05/12/14 1109  ALBUMIN 3.3* 3.5   No results found for this basename: LIPASE, AMYLASE,  in the last 168 hours No results found for this basename: AMMONIA,  in the last 168 hours CBC:  Recent Labs Lab 05/07/14 1234 05/08/14 0205  05/09/14 0300 05/09/14 1036 05/12/14 1109  WBC 7.2 16.3* 6.1 6.3 6.3  NEUTROABS 3.5  --   --   --   --   HGB 10.4* 12.7 10.0* 9.7* 10.1*  HCT 32.9* 39.5 31.5* 30.7* 30.8*  MCV 105.8* 107.3* 108.6* 106.2* 104.4*  PLT 236 273 217 223 269   Cardiac Enzymes:  Recent Labs Lab 05/08/14 0205  TROPONINI <0.30   BNP: BNP (last 3 results)  Recent Labs  04/19/14 0648 05/01/14 2312 05/08/14 0205  PROBNP 32641.0* 32817.0* 33103.0*   CBG:  Recent Labs Lab 05/08/14 0023  GLUCAP 207*    Signed:  Vernell Leep, MD, FACP, FHM. [facility] Pager [phone]  If 7PM-7AM, please contact night-coverage [email or web address] Password [email or web address] 05/12/2014, 1:54 PM

## 2014-05-13 ENCOUNTER — Ambulatory Visit (INDEPENDENT_AMBULATORY_CARE_PROVIDER_SITE_OTHER): Payer: Medicare Other | Admitting: Internal Medicine

## 2014-05-13 ENCOUNTER — Encounter: Payer: Self-pay | Admitting: *Deleted

## 2014-05-13 ENCOUNTER — Encounter: Payer: Self-pay | Admitting: Internal Medicine

## 2014-05-13 VITALS — BP 122/81 | HR 93 | Temp 98.0°F | Ht 64.0 in | Wt 249.0 lb

## 2014-05-13 DIAGNOSIS — Z79899 Other long term (current) drug therapy: Secondary | ICD-10-CM | POA: Insufficient documentation

## 2014-05-13 DIAGNOSIS — B20 Human immunodeficiency virus [HIV] disease: Secondary | ICD-10-CM

## 2014-05-13 DIAGNOSIS — Z21 Asymptomatic human immunodeficiency virus [HIV] infection status: Secondary | ICD-10-CM

## 2014-05-13 DIAGNOSIS — Z113 Encounter for screening for infections with a predominantly sexual mode of transmission: Secondary | ICD-10-CM | POA: Insufficient documentation

## 2014-05-13 LAB — HIV-1 GENOTYPR PLUS

## 2014-05-13 MED ORDER — TENOFOVIR DISOPROXIL FUMARATE 300 MG PO TABS: 300.0000 mg | ORAL_TABLET | ORAL | Status: DC

## 2014-05-13 MED ORDER — RALTEGRAVIR POTASSIUM 400 MG PO TABS: 400.0000 mg | ORAL_TABLET | Freq: Two times a day (BID) | ORAL | Status: DC

## 2014-05-13 MED ORDER — LAMIVUDINE 10 MG/ML PO SOLN: 25.0000 mg | Freq: Every day | ORAL | Status: DC

## 2014-05-13 NOTE — Progress Notes (Signed)
   Subjective:    Patient ID: [name], female    DOB: 01-14-1951, 63 y.o.   MRN: [id number]  HPI Here to establish as a new 042 patient.  Has had HIV for 17 years, [facility] and has been on meds for about 5 years.  She is not aware of what meds she has been on but most recently on lamivudine, tenofovir and Isentress, renally dosed for dialysis.  Was recently hospitalized for fluid overload after missing dialysis.  ESRD with TTS dialysis.  CHF.  Has been off ARVs since moving to the area and did not establish care.  Last CD4 450 and undetectable vl but off meds for several months.  Now living in a NH.  Just discharged yesterday.   Unknown nadir.  Does not know of ever having STIs.      Review of Systems  Constitutional: Negative for fever and fatigue.  HENT: Negative for trouble swallowing.   Eyes: Negative for visual disturbance.  Respiratory: Positive for cough.        Occasional, non productive  Cardiovascular: Negative for leg swelling.  Gastrointestinal: Negative for nausea and diarrhea.  Skin: Negative for rash.  Neurological: Negative for dizziness and light-headedness.  Hematological: Negative for adenopathy.  Psychiatric/Behavioral: Negative for sleep disturbance.       Objective:   Physical Exam  Constitutional: She appears well-developed and well-nourished. No distress.  In wheelchair  HENT:  Mouth/Throat: No oropharyngeal exudate.  Eyes: No scleral icterus.  Cardiovascular: Normal rate, regular rhythm and normal heart sounds.   No murmur heard. Pulmonary/Chest: Effort normal and breath sounds normal. No respiratory distress. She has no wheezes.  Musculoskeletal: She exhibits no edema.  Lymphadenopathy:    She has no cervical adenopathy.  Skin: No rash noted.          Assessment & Plan:

## 2014-05-13 NOTE — Assessment & Plan Note (Signed)
Will restart her previous regiemen and follow.  RTC 3 weeks for labs and I will see her after.

## 2014-05-17 NOTE — ED Provider Notes (Signed)
I saw and evaluated the patient, reviewed the resident's note and I agree with the findings and plan.   EKG Interpretation   Date/Time:  Thursday May 07 2014 11:47:51 EDT Ventricular Rate:  96 PR Interval:  180 QRS Duration: 102 QT Interval:  373 QTC Calculation: 471 R Axis:   23 Text Interpretation:  Sinus rhythm Abnormal R-wave progression, early  transition No significant change since last tracing Confirmed by Klara Stjames   MD-J, Laci Frenkel ([id number]) on 05/07/2014 11:54:34 AM      Pt with frequent visits to the ED.  Poor social situation at home.  No acute medical issues identified today.  Follow up with dialysis as instructed.  Md Linwood DibblesJon Paidyn Mcferran, MD 05/17/14 [id number]

## 2014-06-03 ENCOUNTER — Other Ambulatory Visit: Payer: Self-pay

## 2014-06-04 ENCOUNTER — Emergency Department (HOSPITAL_COMMUNITY): Payer: Medicare Other

## 2014-06-04 ENCOUNTER — Emergency Department (HOSPITAL_COMMUNITY)
Admission: EM | Admit: 2014-06-04 | Discharge: 2014-06-05 | Discharge status: Against Medical Advice | Payer: Medicare Other | Attending: Emergency Medicine | Admitting: Emergency Medicine

## 2014-06-04 ENCOUNTER — Encounter (HOSPITAL_COMMUNITY): Payer: Self-pay | Admitting: Emergency Medicine

## 2014-06-04 DIAGNOSIS — F17200 Nicotine dependence, unspecified, uncomplicated: Secondary | ICD-10-CM | POA: Diagnosis not present

## 2014-06-04 DIAGNOSIS — Z7982 Long term (current) use of aspirin: Secondary | ICD-10-CM | POA: Diagnosis not present

## 2014-06-04 DIAGNOSIS — Z9889 Other specified postprocedural states: Secondary | ICD-10-CM | POA: Diagnosis not present

## 2014-06-04 DIAGNOSIS — I120 Hypertensive chronic kidney disease with stage 5 chronic kidney disease or end stage renal disease: Secondary | ICD-10-CM | POA: Diagnosis not present

## 2014-06-04 DIAGNOSIS — Z79899 Other long term (current) drug therapy: Secondary | ICD-10-CM | POA: Insufficient documentation

## 2014-06-04 DIAGNOSIS — N186 End stage renal disease: Secondary | ICD-10-CM | POA: Insufficient documentation

## 2014-06-04 DIAGNOSIS — R072 Precordial pain: Secondary | ICD-10-CM | POA: Insufficient documentation

## 2014-06-04 DIAGNOSIS — Z992 Dependence on renal dialysis: Secondary | ICD-10-CM | POA: Insufficient documentation

## 2014-06-04 DIAGNOSIS — F411 Generalized anxiety disorder: Secondary | ICD-10-CM | POA: Insufficient documentation

## 2014-06-04 DIAGNOSIS — K219 Gastro-esophageal reflux disease without esophagitis: Secondary | ICD-10-CM | POA: Insufficient documentation

## 2014-06-04 DIAGNOSIS — Z21 Asymptomatic human immunodeficiency virus [HIV] infection status: Secondary | ICD-10-CM | POA: Diagnosis not present

## 2014-06-04 DIAGNOSIS — M129 Arthropathy, unspecified: Secondary | ICD-10-CM | POA: Diagnosis not present

## 2014-06-04 DIAGNOSIS — I509 Heart failure, unspecified: Secondary | ICD-10-CM | POA: Insufficient documentation

## 2014-06-04 DIAGNOSIS — R079 Chest pain, unspecified: Secondary | ICD-10-CM | POA: Insufficient documentation

## 2014-06-04 DIAGNOSIS — Z8701 Personal history of pneumonia (recurrent): Secondary | ICD-10-CM | POA: Diagnosis not present

## 2014-06-04 DIAGNOSIS — D649 Anemia, unspecified: Secondary | ICD-10-CM | POA: Diagnosis not present

## 2014-06-04 LAB — BASIC METABOLIC PANEL
Anion gap: 17 — ABNORMAL HIGH (ref 5–15)
BUN: 20 mg/dL (ref 6–23)
CO2: 27 mEq/L (ref 19–32)
CREATININE: 5.77 mg/dL — AB (ref 0.50–1.10)
Calcium: 9.9 mg/dL (ref 8.4–10.5)
Chloride: 93 mEq/L — ABNORMAL LOW (ref 96–112)
GFR calc non Af Amer: 7 mL/min — ABNORMAL LOW (ref >90)
GFR, EST AFRICAN AMERICAN: 8 mL/min — AB (ref >90)
GLUCOSE: 168 mg/dL — AB (ref 70–99)
Potassium: 4.6 mEq/L (ref 3.7–5.3)
Sodium: 137 mEq/L (ref 137–147)

## 2014-06-04 LAB — I-STAT TROPONIN, ED: Troponin i, poc: 0.01 ng/mL (ref 0.00–0.08)

## 2014-06-04 LAB — CBC
HCT: 40.6 % (ref 36.0–46.0)
Hemoglobin: 13 g/dL (ref 12.0–15.0)
MCH: 33.9 pg (ref 26.0–34.0)
MCHC: 32 g/dL (ref 30.0–36.0)
MCV: 106 fL — ABNORMAL HIGH (ref 78.0–100.0)
PLATELETS: 265 10*3/uL (ref 150–400)
RBC: 3.83 MIL/uL — ABNORMAL LOW (ref 3.87–5.11)
RDW: 14 % (ref 11.5–15.5)
WBC: 5 10*3/uL (ref 4.0–10.5)

## 2014-06-04 LAB — PRO B NATRIURETIC PEPTIDE: Pro B Natriuretic peptide (BNP): 4928 pg/mL — ABNORMAL HIGH (ref 0–125)

## 2014-06-04 NOTE — ED Notes (Signed)
Pt refused ISTAT troponin after unsuccessful blood draw attempts.

## 2014-06-04 NOTE — Progress Notes (Signed)
ED CM spoke with patient tonight.. Patient presented to [facility] ED with CP. Marland Kitchen ED evaluation with no significant finding. Patient reports wanting to find another ALF, states that [facility] cannot accommodate her renal diet. Explained that at this late time we are unable to contact [facility]. ED CM will contact CSW to follow up tomorrow. Pt is agreeble to this plan and is willing  to return to the Facility, Updated Dr. Gwendolyn Grant and Britta Mccreedy RN on Pod A. ED CM will follow up tomorrow 8/28

## 2014-06-04 NOTE — Discharge Instructions (Signed)

## 2014-06-04 NOTE — ED Provider Notes (Signed)
CSN: [id number]     Arrival date & time 06/04/14  1933 History   First MD Initiated Contact with Patient 06/04/14 1934     Chief Complaint  Patient presents with  . Chest Pain     (Consider location/radiation/quality/duration/timing/severity/associated sxs/prior Treatment) Patient is a 63 y.o. female presenting with chest pain. The history is provided by the patient.  Chest Pain Pain location:  Substernal area Pain quality: aching   Pain radiates to:  Neck Pain radiates to the back: no   Pain severity:  Moderate Onset quality:  Gradual Timing:  Constant Progression:  Unchanged Chronicity:  New Context comment:  During dialysis Relieved by:  Nothing Worsened by:  Nothing tried Associated symptoms: palpitations (intermittent)   Associated symptoms: no abdominal pain, no cough, no fever, no shortness of breath and not vomiting     Past Medical History  Diagnosis Date  . Hypertension   . HIV infection   . Morbid obesity   . ESRD (end stage renal disease) on dialysis 09/30/2013    Started dialysis in [place], [place] around 2009.  ESRD due to HTN vs drug abuse according to pt.  Was on dialysis at [facility] until Feb 2015 when she was admitted to a SNF due to homelessness and drug abuse.  Then changed to [facility] on TTS schedule.     . Anginal pain   . CHF (congestive heart failure)   . Shortness of breath   . Pneumonia   . Depression   . Anxiety   . GERD (gastroesophageal reflux disease)   . Arthritis   . Anemia    Past Surgical History  Procedure Laterality Date  . Arteriovenous graft placement      left forearm  . Cardiac catheterization    . Laparotomy      states seh was cut open because seh was bleeding on the inside   Family History  Problem Relation Age of Onset  . Kidney failure Other     niece  . High blood pressure    . Lung cancer    . Breast cancer Neg Hx   . Colon cancer Neg Hx   . Stroke Mother   . HIV/AIDS Brother     died of AIDS   History   Substance Use Topics  . Smoking status: Current Every Day Smoker -- 0.50 packs/day    Types: Cigarettes    Start date: 05/06/2014  . Smokeless tobacco: Never Used     Comment: recently quit  . Alcohol Use: No   OB History   Grav Para Term Preterm Abortions TAB SAB Ect Mult Living                 Review of Systems  Constitutional: Negative for fever and chills.  Respiratory: Negative for cough and shortness of breath.   Cardiovascular: Positive for chest pain and palpitations (intermittent). Negative for leg swelling.  Gastrointestinal: Negative for vomiting and abdominal pain.  All other systems reviewed and are negative.     Allergies  Tramadol and Morphine and related  Home Medications   Prior to Admission medications   Medication Sig Start Date End Date Taking? Authorizing Provider  amLODipine (NORVASC) 5 MG tablet Take 2 tablets (10 mg total) by mouth at bedtime. 05/12/14   Elease Etienne, MD  aspirin EC 81 MG tablet Take 1 tablet (81 mg total) by mouth daily. 05/12/14   Elease Etienne, MD  calcitRIOL (ROCALTROL) 0.5 MCG capsule Take  4 capsules (2 mcg total) by mouth Every Tuesday,Thursday,and Saturday with dialysis. 05/12/14   Elease Etienne, MD  cinacalcet (SENSIPAR) 90 MG tablet Take 90 mg by mouth daily.    Historical Provider, MD  colchicine 0.6 MG tablet Take 0.6 mg by mouth daily.    Historical Provider, MD  diphenhydrAMINE (BENADRYL) 50 MG capsule Take 50 mg by mouth at bedtime as needed for sleep.    Historical Provider, MD  FLUoxetine (PROZAC) 20 MG capsule Take 1 capsule (20 mg total) by mouth daily. 05/12/14   Elease Etienne, MD  hydrALAZINE (APRESOLINE) 25 MG tablet Take 1 tablet (25 mg total) by mouth every 8 (eight) hours. 04/17/14   Kathlen Mody, MD  ipratropium-albuterol (DUONEB) 0.5-2.5 (3) MG/3ML SOLN Take 3 mLs by nebulization every 6 (six) hours as needed (Dyspnea or wheezing.). 05/12/14   Elease Etienne, MD  isosorbide mononitrate (IMDUR) 30 MG 24 hr  tablet Take 1 tablet (30 mg total) by mouth daily. 05/12/14   Elease Etienne, MD  labetalol (NORMODYNE) 100 MG tablet Take 1 tablet (100 mg total) by mouth 2 (two) times daily. 05/12/14   Elease Etienne, MD  lamiVUDine (EPIVIR) 10 MG/ML solution Take 2.5 mLs (25 mg total) by mouth daily. 50 mg once then 25 mg po daily 05/13/14   Gardiner Barefoot, MD  LORazepam (ATIVAN) 0.5 MG tablet Take 1 tablet (0.5 mg total) by mouth daily as needed for anxiety. 05/12/14   Elease Etienne, MD  multivitamin (RENA-VIT) TABS tablet Take 1 tablet by mouth daily. 05/12/14   Elease Etienne, MD  Nutritional Supplements (FEEDING SUPPLEMENT, NEPRO CARB STEADY,) LIQD Take 237 mLs by mouth as needed (missed meal during dialysis.). 05/12/14   Elease Etienne, MD  omeprazole (PRILOSEC) 20 MG capsule Take 20 mg by mouth daily.    Historical Provider, MD  polyethylene glycol (MIRALAX / GLYCOLAX) packet Take 17 g by mouth daily as needed for mild constipation. 05/12/14   Elease Etienne, MD  raltegravir (ISENTRESS) 400 MG tablet Take 1 tablet (400 mg total) by mouth 2 (two) times daily. 05/13/14   Gardiner Barefoot, MD  senna-docusate (SENOKOT-S) 8.6-50 MG per tablet Take 2 tablets by mouth daily. 05/12/14   Elease Etienne, MD  sevelamer carbonate (RENVELA) 800 MG tablet Take 3 tablets (2,400 mg total) by mouth 3 (three) times daily with meals. 05/12/14   Elease Etienne, MD  tenofovir (VIREAD) 300 MG tablet Take 1 tablet (300 mg total) by mouth once a week. 05/13/14   Gardiner Barefoot, MD   Temp(Src) 98.2 F (36.8 C) (Oral)  Resp 26  Ht  (1.626 m)  Wt 240 lb (108.863 kg)  BMI 41.18 kg/m2  SpO2 95% Physical Exam  Nursing note and vitals reviewed. Constitutional: She is oriented to person, place, and time. She appears well-developed and well-nourished. No distress.  HENT:  Head: Normocephalic and atraumatic.  Mouth/Throat: Oropharynx is clear and moist.  Eyes: EOM are normal. Pupils are equal, round, and reactive to light.  Neck:  Normal range of motion. Neck supple.  Cardiovascular: Normal rate and regular rhythm.  Exam reveals no friction rub.   No murmur heard. Pulmonary/Chest: Effort normal. No respiratory distress. She has no wheezes. She has no rales. She exhibits tenderness (mild L).  Abdominal: Soft. She exhibits no distension. There is no tenderness. There is no rebound.  Musculoskeletal: Normal range of motion. She exhibits no edema.  Neurological: She is alert  and oriented to person, place, and time.  Skin: No rash noted. She is not diaphoretic.    ED Course  Procedures (including critical care time) Labs Review Labs Reviewed  BASIC METABOLIC PANEL  CBC  I-STAT TROPOININ, ED    Imaging Review Dg Chest Port 1 View  06/04/2014   CLINICAL DATA:  Difficulty breathing and chest pain  EXAM: PORTABLE CHEST - 1 VIEW  COMPARISON:  May 09, 2014  FINDINGS: Lungs are clear. Heart size and pulmonary vascularity are normal. No adenopathy. No bone lesions. No pneumothorax.  IMPRESSION: No edema or consolidation.   Electronically Signed   By: Bretta Bang M.D.   On: 06/04/2014 20:19     EKG Interpretation None       Date: 06/04/2014  Rate: 113  Rhythm: sinus tachycardia  QRS Axis: normal  Intervals: normal  ST/T Wave abnormalities: normal  Conduction Disutrbances:none  Narrative Interpretation:   Old EKG Reviewed: unchanged    MDM   Final diagnoses:  Chest pain, unspecified chest pain type    63 year old female here with neck and chest pain. Began on dialysis. Had an intermittent periods of her chest fluttering. She did all that 30 minutes of her normal dialysis session today. She also is complaining about the diet given to her at her new facility. She's recently switched facilities because she did not like all the physical therapy and chest soreness. Today she describes chest aching in her central chest it radiates up to her neck. She is up or straining cannot comment much more about her chest  pain. She said last time she had this she hasn't had fluid removed off of her because of her heart. On exam she has some mild chest tenderness, but her aching is not fully reproducible. Lungs are clear. No peripheral edema. Belly benign. We'll check labs. EKG is tachycardic, but otherwise similar to prior. Chest pain story is atypical. She reports feeling shaky and she thinks she had a panic attack. Per social work, she was homeless and now finally has a place to live. Patient refused 2nd troponin - was tired of getting stuck. AMA done. She wants to leave, stable to go home.   Elwin Mocha, MD 06/04/14 [phone]

## 2014-06-04 NOTE — ED Notes (Signed)
Pt reports pain from right neck down to pelvic area that began approx 1400 today during dialysis. Pt denies CP but states "I have congestive heart failure."  Moved yesterday from [facility] center until yesterday, moved to [facility] and does not like it there.  Appears she is unhappy with her situation and states she cannot get a renal diet there.  Asking for Social Services to help arrange a new place to stay.

## 2014-06-04 NOTE — ED Notes (Signed)
[facility] called for pt 

## 2014-06-04 NOTE — ED Notes (Signed)
Pt had dialysis today was shortened by 30 min dt co neck pain pt states she had chest pain at 1500 but did not tell dialysis staff pt went home and smoked a cigarette and pain returned so called 911 c/o dizziness sob nausea pain in chest to neck and back

## 2014-06-05 ENCOUNTER — Emergency Department (HOSPITAL_COMMUNITY): Payer: Medicare Other

## 2014-06-05 ENCOUNTER — Emergency Department (HOSPITAL_COMMUNITY)
Admission: EM | Admit: 2014-06-05 | Discharge: 2014-06-05 | Disposition: A | Discharge status: Home / Self Care | Payer: Medicare Other | Attending: Emergency Medicine | Admitting: Emergency Medicine

## 2014-06-05 ENCOUNTER — Encounter (HOSPITAL_COMMUNITY): Payer: Self-pay | Admitting: Emergency Medicine

## 2014-06-05 DIAGNOSIS — R52 Pain, unspecified: Secondary | ICD-10-CM | POA: Insufficient documentation

## 2014-06-05 DIAGNOSIS — N186 End stage renal disease: Secondary | ICD-10-CM | POA: Insufficient documentation

## 2014-06-05 DIAGNOSIS — E875 Hyperkalemia: Secondary | ICD-10-CM | POA: Insufficient documentation

## 2014-06-05 DIAGNOSIS — M129 Arthropathy, unspecified: Secondary | ICD-10-CM | POA: Insufficient documentation

## 2014-06-05 DIAGNOSIS — Z79899 Other long term (current) drug therapy: Secondary | ICD-10-CM | POA: Diagnosis not present

## 2014-06-05 DIAGNOSIS — I509 Heart failure, unspecified: Secondary | ICD-10-CM | POA: Diagnosis not present

## 2014-06-05 DIAGNOSIS — F17200 Nicotine dependence, unspecified, uncomplicated: Secondary | ICD-10-CM | POA: Insufficient documentation

## 2014-06-05 DIAGNOSIS — R079 Chest pain, unspecified: Secondary | ICD-10-CM | POA: Diagnosis not present

## 2014-06-05 DIAGNOSIS — K219 Gastro-esophageal reflux disease without esophagitis: Secondary | ICD-10-CM | POA: Insufficient documentation

## 2014-06-05 DIAGNOSIS — Z862 Personal history of diseases of the blood and blood-forming organs and certain disorders involving the immune mechanism: Secondary | ICD-10-CM | POA: Insufficient documentation

## 2014-06-05 DIAGNOSIS — I10 Essential (primary) hypertension: Secondary | ICD-10-CM | POA: Insufficient documentation

## 2014-06-05 DIAGNOSIS — I120 Hypertensive chronic kidney disease with stage 5 chronic kidney disease or end stage renal disease: Secondary | ICD-10-CM | POA: Diagnosis not present

## 2014-06-05 DIAGNOSIS — Z8701 Personal history of pneumonia (recurrent): Secondary | ICD-10-CM | POA: Diagnosis not present

## 2014-06-05 DIAGNOSIS — Z21 Asymptomatic human immunodeficiency virus [HIV] infection status: Secondary | ICD-10-CM | POA: Insufficient documentation

## 2014-06-05 DIAGNOSIS — M1219 Kaschin-Beck disease, multiple sites: Secondary | ICD-10-CM | POA: Diagnosis not present

## 2014-06-05 DIAGNOSIS — F419 Anxiety disorder, unspecified: Secondary | ICD-10-CM

## 2014-06-05 DIAGNOSIS — F411 Generalized anxiety disorder: Secondary | ICD-10-CM | POA: Diagnosis not present

## 2014-06-05 LAB — CBC WITH DIFFERENTIAL/PLATELET
Basophils Absolute: 0 10*3/uL (ref 0.0–0.1)
Basophils Relative: 0 % (ref 0–1)
EOS ABS: 0.1 10*3/uL (ref 0.0–0.7)
Eosinophils Relative: 1 % (ref 0–5)
HCT: 40.1 % (ref 36.0–46.0)
Hemoglobin: 12.9 g/dL (ref 12.0–15.0)
LYMPHS ABS: 2.6 10*3/uL (ref 0.7–4.0)
LYMPHS PCT: 46 % (ref 12–46)
MCH: 34.4 pg — AB (ref 26.0–34.0)
MCHC: 32.2 g/dL (ref 30.0–36.0)
MCV: 106.9 fL — AB (ref 78.0–100.0)
MONO ABS: 0.7 10*3/uL (ref 0.1–1.0)
Monocytes Relative: 13 % — ABNORMAL HIGH (ref 3–12)
Neutro Abs: 2.3 10*3/uL (ref 1.7–7.7)
Neutrophils Relative %: 40 % — ABNORMAL LOW (ref 43–77)
PLATELETS: 241 10*3/uL (ref 150–400)
RBC: 3.75 MIL/uL — AB (ref 3.87–5.11)
RDW: 14.1 % (ref 11.5–15.5)
WBC: 5.7 10*3/uL (ref 4.0–10.5)

## 2014-06-05 LAB — BASIC METABOLIC PANEL
Anion gap: 18 — ABNORMAL HIGH (ref 5–15)
BUN: 29 mg/dL — AB (ref 6–23)
CALCIUM: 8.7 mg/dL (ref 8.4–10.5)
CO2: 24 mEq/L (ref 19–32)
Chloride: 92 mEq/L — ABNORMAL LOW (ref 96–112)
Creatinine, Ser: 7.94 mg/dL — ABNORMAL HIGH (ref 0.50–1.10)
GFR calc Af Amer: 6 mL/min — ABNORMAL LOW (ref >90)
GFR, EST NON AFRICAN AMERICAN: 5 mL/min — AB (ref >90)
Glucose, Bld: 100 mg/dL — ABNORMAL HIGH (ref 70–99)
Potassium: 5.9 mEq/L — ABNORMAL HIGH (ref 3.7–5.3)
SODIUM: 134 meq/L — AB (ref 137–147)

## 2014-06-05 LAB — TROPONIN I

## 2014-06-05 MED ORDER — SODIUM POLYSTYRENE SULFONATE 15 GM/60ML PO SUSP
45.0000 g | Freq: Once | ORAL | Status: AC
  Administered 2014-06-05: 45 g via ORAL
  Filled 2014-06-05: qty 180

## 2014-06-05 NOTE — ED Notes (Signed)
Pt not going for xray at this time. Still attempting to obtain blood on the patient.

## 2014-06-05 NOTE — ED Notes (Signed)
Dr. Pollina at the bedside.  

## 2014-06-05 NOTE — Discharge Instructions (Signed)
You must go to dialysis tomorrow to have your potassium lowered. Do not miss dialysis.  Hyperkalemia Hyperkalemia means you have too much potassium in your blood. Potassium is a type of salt in the blood (electrolyte). Normally, your kidneys remove potassium from the body. Too much potassium can be life-threatening. HOME CARE  Only take medicine as told by your doctor.  Do not take vitamins or natural products unless your doctor says they are okay.  Keep all doctor visits as told.  Follow diet instructions as told by your doctor. GET HELP RIGHT AWAY IF:  Your heartbeat is not regular or very slow.  You feel dizzy (lightheaded).  You feel weak.  You are short of breath.  You have chest pain.  You pass out (faint). MAKE SURE YOU:   Understand these instructions.  Will watch your condition.  Will get help right away if you are not doing well or get worse. Document Released: 09/25/2005 Document Revised: 12/18/2011 Document Reviewed: 12/31/2013 [facility] Patient Information 2015 [place], [place]. This information is not intended to replace advice given to you by your health care provider. Make sure you discuss any questions you have with your health care provider.  Panic Attacks Panic attacks are sudden, short feelings of great fear or discomfort. You may have them for no reason when you are relaxed, when you are uneasy (anxious), or when you are sleeping.  HOME CARE  Take all your medicines as told.  Check with your doctor before starting new medicines.  Keep all doctor visits. GET HELP IF:  You are not able to take your medicines as told.  Your symptoms do not get better.  Your symptoms get worse. GET HELP RIGHT AWAY IF:  Your attacks seem different than your normal attacks.  You have thoughts about hurting yourself or others.  You take panic attack medicine and you have a side effect. MAKE SURE YOU:  Understand these instructions.  Will watch your  condition.  Will get help right away if you are not doing well or get worse. Document Released: 10/28/2010 Document Revised: 07/16/2013 Document Reviewed: 05/09/2013 [facility] Patient Information 2015 [place], [place]. This information is not intended to replace advice given to you by your health care provider. Make sure you discuss any questions you have with your health care provider.

## 2014-06-05 NOTE — ED Notes (Signed)
Stick x 2 for blood work, unsuccessful. Phlebotomy called

## 2014-06-05 NOTE — ED Notes (Signed)
Notified [facility] for transportation 

## 2014-06-05 NOTE — ED Provider Notes (Signed)
CSN: [id number]     Arrival date & time 06/05/14  1252 History   First MD Initiated Contact with Patient 06/05/14 1455     Chief Complaint  Patient presents with  . Generalized Body Aches     (Consider location/radiation/quality/duration/timing/severity/associated sxs/prior Treatment) HPI Comments: Patient presents to the ER for evaluation of multiple complaints. Patient reports that she is aching all over. This is somewhat chronic for her. She also has chest discomfort. Patient reports that she was sitting on the front porch of her assisted living building and saw a lizard, got scared. She was told by someone there that was at my job on her which made her even more frightened and she began to develop chest discomfort.  Patient is complaining of her living arrangements. She recently left more assisted living to go to another. She reports that she was being mistreated at the first one, now is alleging that she has not been treated fairly of the new assisted living and would like consideration for moving once again.   Past Medical History  Diagnosis Date  . Hypertension   . HIV infection   . Morbid obesity   . ESRD (end stage renal disease) on dialysis 09/30/2013    Started dialysis in [place], [place] around 2009.  ESRD due to HTN vs drug abuse according to pt.  Was on dialysis at [facility] until Feb 2015 when she was admitted to a SNF due to homelessness and drug abuse.  Then changed to [facility] on TTS schedule.     . Anginal pain   . CHF (congestive heart failure)   . Shortness of breath   . Pneumonia   . Depression   . Anxiety   . GERD (gastroesophageal reflux disease)   . Arthritis   . Anemia    Past Surgical History  Procedure Laterality Date  . Arteriovenous graft placement      left forearm  . Cardiac catheterization    . Laparotomy      states seh was cut open because seh was bleeding on the inside   Family History  Problem Relation Age of Onset  . Kidney failure  Other     niece  . High blood pressure    . Lung cancer    . Breast cancer Neg Hx   . Colon cancer Neg Hx   . Stroke Mother   . HIV/AIDS Brother     died of AIDS   History  Substance Use Topics  . Smoking status: Current Every Day Smoker -- 0.50 packs/day    Types: Cigarettes    Start date: 05/06/2014  . Smokeless tobacco: Never Used     Comment: recently quit  . Alcohol Use: No   OB History   Grav Para Term Preterm Abortions TAB SAB Ect Mult Living                 Review of Systems  Cardiovascular: Positive for chest pain.  Musculoskeletal: Positive for back pain and myalgias.  All other systems reviewed and are negative.     Allergies  Tramadol and Morphine and related  Home Medications   Prior to Admission medications   Medication Sig Start Date End Date Taking? Authorizing Provider  amLODipine (NORVASC) 5 MG tablet Take 2 tablets (10 mg total) by mouth at bedtime. 05/12/14  Yes Elease Etienne, MD  calcitRIOL (ROCALTROL) 0.5 MCG capsule Take 4 capsules (2 mcg total) by mouth Every Tuesday,Thursday,and Saturday with dialysis. 05/12/14  Yes Elease Etienne, MD  cinacalcet (SENSIPAR) 90 MG tablet Take 90 mg by mouth daily.   Yes Historical Provider, MD  colchicine 0.6 MG tablet Take 0.6 mg by mouth daily.   Yes Historical Provider, MD  diphenhydrAMINE (BENADRYL) 50 MG capsule Take 50 mg by mouth at bedtime as needed for sleep.   Yes Historical Provider, MD  FLUoxetine (PROZAC) 20 MG capsule Take 1 capsule (20 mg total) by mouth daily. 05/12/14  Yes Elease Etienne, MD  hydrALAZINE (APRESOLINE) 25 MG tablet Take 1 tablet (25 mg total) by mouth every 8 (eight) hours. 04/17/14  Yes Kathlen Mody, MD  isosorbide mononitrate (IMDUR) 30 MG 24 hr tablet Take 1 tablet (30 mg total) by mouth daily. 05/12/14  Yes Elease Etienne, MD  lamiVUDine (EPIVIR) 10 MG/ML solution Take 25 mg by mouth daily.   Yes Historical Provider, MD  LORazepam (ATIVAN) 0.5 MG tablet Take 1 tablet (0.5 mg  total) by mouth daily as needed for anxiety. 05/12/14  Yes Elease Etienne, MD  multivitamin (RENA-VIT) TABS tablet Take 1 tablet by mouth daily. 05/12/14  Yes Elease Etienne, MD  omeprazole (PRILOSEC) 20 MG capsule Take 20 mg by mouth daily.   Yes Historical Provider, MD  oxyCODONE-acetaminophen (PERCOCET/ROXICET) 5-325 MG per tablet Take 1 tablet by mouth every 4 (four) hours as needed for severe pain.   Yes Historical Provider, MD  polyethylene glycol (MIRALAX / GLYCOLAX) packet Take 17 g by mouth daily as needed for mild constipation. 05/12/14  Yes Elease Etienne, MD  raltegravir (ISENTRESS) 400 MG tablet Take 1 tablet (400 mg total) by mouth 2 (two) times daily. 05/13/14  Yes Gardiner Barefoot, MD  sevelamer carbonate (RENVELA) 800 MG tablet Take 3 tablets (2,400 mg total) by mouth 3 (three) times daily with meals. 05/12/14  Yes Elease Etienne, MD  tenofovir (VIREAD) 300 MG tablet Take 1 tablet (300 mg total) by mouth once a week. 05/13/14  Yes Gardiner Barefoot, MD   BP 136/74  Pulse 93  Temp(Src) 98 F (36.7 C) (Oral)  Resp 20  SpO2 93% Physical Exam  Constitutional: She is oriented to person, place, and time. She appears well-developed and well-nourished. No distress.  HENT:  Head: Normocephalic and atraumatic.  Right Ear: Hearing normal.  Left Ear: Hearing normal.  Nose: Nose normal.  Mouth/Throat: Oropharynx is clear and moist and mucous membranes are normal.  Eyes: Conjunctivae and EOM are normal. Pupils are equal, round, and reactive to light.  Neck: Normal range of motion. Neck supple.  Cardiovascular: Regular rhythm, S1 normal and S2 normal.  Exam reveals no gallop and no friction rub.   No murmur heard. Pulmonary/Chest: Effort normal and breath sounds normal. No respiratory distress. She exhibits no tenderness.  Abdominal: Soft. Normal appearance and bowel sounds are normal. There is no hepatosplenomegaly. There is no tenderness. There is no rebound, no guarding, no tenderness at  McBurney's point and negative Murphy's sign. No hernia.  Musculoskeletal: Normal range of motion.  Neurological: She is alert and oriented to person, place, and time. She has normal strength. No cranial nerve deficit or sensory deficit. Coordination normal. GCS eye subscore is 4. GCS verbal subscore is 5. GCS motor subscore is 6.  Skin: Skin is warm, dry and intact. No rash noted. No cyanosis.  Psychiatric: She has a normal mood and affect. Her speech is normal and behavior is normal. Thought content normal.    ED Course  Procedures (including critical  care time) Labs Review Labs Reviewed  CBC WITH DIFFERENTIAL  BASIC METABOLIC PANEL  TROPONIN I    Imaging Review Dg Chest Port 1 View  06/04/2014   CLINICAL DATA:  Difficulty breathing and chest pain  EXAM: PORTABLE CHEST - 1 VIEW  COMPARISON:  May 09, 2014  FINDINGS: Lungs are clear. Heart size and pulmonary vascularity are normal. No adenopathy. No bone lesions. No pneumothorax.  IMPRESSION: No edema or consolidation.   Electronically Signed   By: Bretta Bang M.D.   On: 06/04/2014 20:19     EKG Interpretation None      Date: 06/05/2014  Rate: 112  Rhythm: sinus tachycardia  QRS Axis: normal  Intervals: QT prolonged  ST/T Wave abnormalities: nonspecific ST/T changes  Conduction Disutrbances:none  Narrative Interpretation:   Old EKG Reviewed: unchanged    MDM   Final diagnoses:  None   anxiety  Hyperkalemia  Presents to the ER for evaluation of generalized body aches and increased anxiety. Patient was frightened by it was earlier and developed some chest discomfort. She became very anxious and had what appears to be a panic attack which she has had before. Her workup was negative here except for mildly elevated potassium. Patient has end-stage renal disease, for dialysis tomorrow. Case discussed briefly with Doctor Allena Katz, on call for nephrology. He did feel that the patient could be administered Kayexalate and safely  discharged to followup in dialysis tomorrow. I do not feel she needs any further cardiac workup is in her pain, as it was atypical and consistent with her anxiety.    Gilda Crease, MD 06/05/14 [phone]

## 2014-06-05 NOTE — Progress Notes (Addendum)
ED CM spoke with Lerry Paterson at [facility] concerning patient's c/o not being able to provide a renal at the facility. Latoya confirmed they prepare and provide renal diets.  Latoya stated that she will make sure patient received a renal diet for each meal . Discussed this with patient, she is agreeable return to [facility] via [facility] once patient is medically cleared. Patient verbalized that she wants to go to another ALF informed of the process of contacting the SW at the facility to work on that process, Informed patient that it can not be done in the ED. Pt verbalized understanding teach back done.  No further ED CM needs identified.

## 2014-06-05 NOTE — ED Notes (Signed)
Per EMS: pt c/o generalized body aches and anxiety since moving into [facility]; pt sts told by the people there that there were lizards that would "jump on her"; she recently moved there from "[employer]" because she wanted to continue to smoke; pt requesting social work and to be moved to new facility

## 2014-06-05 NOTE — ED Notes (Addendum)
Spoke with social work concerning pt. The pt feels like she isn't being taken care of there and they can't provide her with a renal diet. She says they give her a heart healthy diet.

## 2014-06-05 NOTE — Progress Notes (Signed)
CSW received a call from the team regarding [name]. Per nursing, [name] was not ready for release as they have just drawn blood. A subsequent call was made to [facility] [phone]). They can take [name] back when ready.  Gretta Cool, LCSW Assistant Director Clinical Social Work [phone]

## 2014-06-05 NOTE — ED Notes (Signed)
Pt states she saw a lizard today and was told by staff at [facility] that they can jump on you. She become frightened and started having cp. Also reports pain to her right hip. No known injury. Pt wants to talk to Social Work about placing her back in skilled living vs assisted living because she can't take care of herself like she thought she could. Is a dialysis pt and goes T,Th, and Sat. Did have dialysis yesterday.

## 2014-06-14 ENCOUNTER — Emergency Department (HOSPITAL_COMMUNITY)
Admission: EM | Admit: 2014-06-14 | Discharge: 2014-06-14 | Disposition: A | Discharge status: Home / Self Care | Payer: Medicare Other | Attending: Emergency Medicine | Admitting: Emergency Medicine

## 2014-06-14 ENCOUNTER — Encounter (HOSPITAL_COMMUNITY): Payer: Self-pay | Admitting: Emergency Medicine

## 2014-06-14 DIAGNOSIS — F329 Major depressive disorder, single episode, unspecified: Secondary | ICD-10-CM | POA: Insufficient documentation

## 2014-06-14 DIAGNOSIS — Z8701 Personal history of pneumonia (recurrent): Secondary | ICD-10-CM | POA: Diagnosis not present

## 2014-06-14 DIAGNOSIS — R002 Palpitations: Secondary | ICD-10-CM

## 2014-06-14 DIAGNOSIS — Z79899 Other long term (current) drug therapy: Secondary | ICD-10-CM | POA: Diagnosis not present

## 2014-06-14 DIAGNOSIS — I120 Hypertensive chronic kidney disease with stage 5 chronic kidney disease or end stage renal disease: Secondary | ICD-10-CM | POA: Diagnosis not present

## 2014-06-14 DIAGNOSIS — Z862 Personal history of diseases of the blood and blood-forming organs and certain disorders involving the immune mechanism: Secondary | ICD-10-CM | POA: Diagnosis not present

## 2014-06-14 DIAGNOSIS — F411 Generalized anxiety disorder: Secondary | ICD-10-CM | POA: Diagnosis not present

## 2014-06-14 DIAGNOSIS — I509 Heart failure, unspecified: Secondary | ICD-10-CM | POA: Diagnosis not present

## 2014-06-14 DIAGNOSIS — I209 Angina pectoris, unspecified: Secondary | ICD-10-CM | POA: Insufficient documentation

## 2014-06-14 DIAGNOSIS — F3289 Other specified depressive episodes: Secondary | ICD-10-CM | POA: Insufficient documentation

## 2014-06-14 DIAGNOSIS — K219 Gastro-esophageal reflux disease without esophagitis: Secondary | ICD-10-CM | POA: Insufficient documentation

## 2014-06-14 DIAGNOSIS — M129 Arthropathy, unspecified: Secondary | ICD-10-CM | POA: Insufficient documentation

## 2014-06-14 DIAGNOSIS — Z992 Dependence on renal dialysis: Secondary | ICD-10-CM | POA: Diagnosis not present

## 2014-06-14 DIAGNOSIS — Z21 Asymptomatic human immunodeficiency virus [HIV] infection status: Secondary | ICD-10-CM | POA: Insufficient documentation

## 2014-06-14 DIAGNOSIS — N186 End stage renal disease: Secondary | ICD-10-CM | POA: Insufficient documentation

## 2014-06-14 LAB — I-STAT CHEM 8, ED
BUN: 26 mg/dL — AB (ref 6–23)
CALCIUM ION: 0.93 mmol/L — AB (ref 1.13–1.30)
Chloride: 102 mEq/L (ref 96–112)
Creatinine, Ser: 8.1 mg/dL — ABNORMAL HIGH (ref 0.50–1.10)
Glucose, Bld: 91 mg/dL (ref 70–99)
HCT: 47 % — ABNORMAL HIGH (ref 36.0–46.0)
HEMOGLOBIN: 16 g/dL — AB (ref 12.0–15.0)
Potassium: 4.8 mEq/L (ref 3.7–5.3)
SODIUM: 135 meq/L — AB (ref 137–147)
TCO2: 25 mmol/L (ref 0–100)

## 2014-06-14 LAB — TSH: TSH: 0.632 u[IU]/mL (ref 0.350–4.500)

## 2014-06-14 NOTE — ED Notes (Signed)
Pt from [facility] via [facility] with c/o heart fluttering and is feeling anxious.  EMS states they saw her last week for chest pain but had her family take her to the doctor.  EMS reports pt takes her ativan Rx and gets ativan from another source.  Also takes extra tylenol regularly.  Pt in NAD, A&O.

## 2014-06-14 NOTE — ED Provider Notes (Signed)
CSN: [id number]     Arrival date & time 06/14/14  1108 History   First MD Initiated Contact with Patient 06/14/14 1116     Chief Complaint  Patient presents with  . Palpitations     HPI Patient presents emergency Department intermittent palpitations.  She also states that she felt anxious.  She does have a history of congestive heart here as well as end-stage renal disease.  She dialyzes Tuesday Thursday Saturday.  Normal dialysis yesterday.  History of MI before in the past.  No active chest pain shortness of breath.  Denies unilateral leg swelling.  No history of DVT or pulmonary embolism.  No recent change in medications.   Past Medical History  Diagnosis Date  . Hypertension   . HIV infection   . Morbid obesity   . ESRD (end stage renal disease) on dialysis 09/30/2013    Started dialysis in [place], [place] around 2009.  ESRD due to HTN vs drug abuse according to pt.  Was on dialysis at [facility] until Feb 2015 when she was admitted to a SNF due to homelessness and drug abuse.  Then changed to [facility] on TTS schedule.     . Anginal pain   . CHF (congestive heart failure)   . Shortness of breath   . Pneumonia   . Depression   . Anxiety   . GERD (gastroesophageal reflux disease)   . Arthritis   . Anemia    Past Surgical History  Procedure Laterality Date  . Arteriovenous graft placement      left forearm  . Cardiac catheterization    . Laparotomy      states seh was cut open because seh was bleeding on the inside   Family History  Problem Relation Age of Onset  . Kidney failure Other     niece  . High blood pressure    . Lung cancer    . Breast cancer Neg Hx   . Colon cancer Neg Hx   . Stroke Mother   . HIV/AIDS Brother     died of AIDS   History  Substance Use Topics  . Smoking status: Current Every Day Smoker -- 0.50 packs/day    Types: Cigarettes    Start date: 05/06/2014  . Smokeless tobacco: Never Used     Comment: recently quit  . Alcohol Use: No   OB  History   Grav Para Term Preterm Abortions TAB SAB Ect Mult Living                 Review of Systems  All other systems reviewed and are negative.     Allergies  Tramadol and Morphine and related  Home Medications   Prior to Admission medications   Medication Sig Start Date End Date Taking? Authorizing Provider  amLODipine (NORVASC) 5 MG tablet Take 2 tablets (10 mg total) by mouth at bedtime. 05/12/14   Elease Etienne, MD  calcitRIOL (ROCALTROL) 0.5 MCG capsule Take 4 capsules (2 mcg total) by mouth Every Tuesday,Thursday,and Saturday with dialysis. 05/12/14   Elease Etienne, MD  cinacalcet (SENSIPAR) 90 MG tablet Take 90 mg by mouth daily.    Historical Provider, MD  colchicine 0.6 MG tablet Take 0.6 mg by mouth daily.    Historical Provider, MD  diphenhydrAMINE (BENADRYL) 50 MG capsule Take 50 mg by mouth at bedtime as needed for sleep.    Historical Provider, MD  FLUoxetine (PROZAC) 20 MG capsule Take 1 capsule (20  mg total) by mouth daily. 05/12/14   Elease Etienne, MD  hydrALAZINE (APRESOLINE) 25 MG tablet Take 1 tablet (25 mg total) by mouth every 8 (eight) hours. 04/17/14   Kathlen Mody, MD  isosorbide mononitrate (IMDUR) 30 MG 24 hr tablet Take 1 tablet (30 mg total) by mouth daily. 05/12/14   Elease Etienne, MD  lamiVUDine (EPIVIR) 10 MG/ML solution Take 25 mg by mouth daily.    Historical Provider, MD  LORazepam (ATIVAN) 0.5 MG tablet Take 1 tablet (0.5 mg total) by mouth daily as needed for anxiety. 05/12/14   Elease Etienne, MD  multivitamin (RENA-VIT) TABS tablet Take 1 tablet by mouth daily. 05/12/14   Elease Etienne, MD  omeprazole (PRILOSEC) 20 MG capsule Take 20 mg by mouth daily.    Historical Provider, MD  oxyCODONE-acetaminophen (PERCOCET/ROXICET) 5-325 MG per tablet Take 1 tablet by mouth every 4 (four) hours as needed for severe pain.    Historical Provider, MD  polyethylene glycol (MIRALAX / GLYCOLAX) packet Take 17 g by mouth daily as needed for mild  constipation. 05/12/14   Elease Etienne, MD  raltegravir (ISENTRESS) 400 MG tablet Take 1 tablet (400 mg total) by mouth 2 (two) times daily. 05/13/14   Gardiner Barefoot, MD  sevelamer carbonate (RENVELA) 800 MG tablet Take 3 tablets (2,400 mg total) by mouth 3 (three) times daily with meals. 05/12/14   Elease Etienne, MD  tenofovir (VIREAD) 300 MG tablet Take 1 tablet (300 mg total) by mouth once a week. 05/13/14   Gardiner Barefoot, MD   BP 167/98  Pulse 83  Temp(Src) 98.1 F (36.7 C) (Oral)  Resp 28  SpO2 100% Physical Exam  Nursing note and vitals reviewed. Constitutional: She is oriented to person, place, and time. She appears well-developed and well-nourished. No distress.  HENT:  Head: Normocephalic and atraumatic.  Eyes: EOM are normal.  Neck: Normal range of motion.  Cardiovascular: Normal rate, regular rhythm and normal heart sounds.   Pulmonary/Chest: Effort normal and breath sounds normal.  Abdominal: Soft. She exhibits no distension. There is no tenderness.  Musculoskeletal: Normal range of motion.  Neurological: She is alert and oriented to person, place, and time.  Skin: Skin is warm and dry.  Psychiatric: She has a normal mood and affect. Judgment normal.    ED Course  Procedures (including critical care time) Labs Review Labs Reviewed  I-STAT CHEM 8, ED - Abnormal; Notable for the following:    Sodium 135 (*)    BUN 26 (*)    Creatinine, Ser 8.10 (*)    Calcium, Ion 0.93 (*)    Hemoglobin 16.0 (*)    HCT 47.0 (*)    All other components within normal limits  TSH    Imaging Review No results found.   EKG Interpretation   Date/Time:  Sunday June 14 2014 11:19:34 EDT Ventricular Rate:  93 PR Interval:  182 QRS Duration: 113 QT Interval:  387 QTC Calculation: 481 R Axis:   56 Text Interpretation:   Sinus rhythm Incomplete left bundle branch block  Borderline prolonged QT interval No significant change was found Confirmed  by Shaheem Pichon  MD, Harshil Cavallaro ([id number])  on 06/14/2014 12:36:35 PM      MDM   Final diagnoses:  Palpitations    Overall well appearing.  Discharge home in good condition.  No lateral abnormalities.  Baseline renal insufficiency.  DC home in good condition.  PCP followup.  While I was in the  room with the patient there are multiple PVCs became across.  I suspect as her her intermittent palpitations    Lyanne Co, MD 06/14/14 1358

## 2014-06-14 NOTE — ED Notes (Signed)
Provider at the bedside.  

## 2014-06-17 ENCOUNTER — Ambulatory Visit: Payer: Self-pay | Admitting: Internal Medicine

## 2014-06-29 ENCOUNTER — Emergency Department (HOSPITAL_COMMUNITY): Payer: Medicare Other

## 2014-06-29 ENCOUNTER — Encounter (HOSPITAL_COMMUNITY): Payer: Self-pay | Admitting: Emergency Medicine

## 2014-06-29 ENCOUNTER — Other Ambulatory Visit: Payer: Medicare Other

## 2014-06-29 ENCOUNTER — Emergency Department (HOSPITAL_COMMUNITY)
Admission: EM | Admit: 2014-06-29 | Discharge: 2014-06-30 | Disposition: A | Discharge status: Home / Self Care | Payer: Medicare Other | Attending: Emergency Medicine | Admitting: Emergency Medicine

## 2014-06-29 DIAGNOSIS — Z992 Dependence on renal dialysis: Secondary | ICD-10-CM | POA: Insufficient documentation

## 2014-06-29 DIAGNOSIS — Z21 Asymptomatic human immunodeficiency virus [HIV] infection status: Secondary | ICD-10-CM | POA: Insufficient documentation

## 2014-06-29 DIAGNOSIS — F17200 Nicotine dependence, unspecified, uncomplicated: Secondary | ICD-10-CM | POA: Diagnosis not present

## 2014-06-29 DIAGNOSIS — M1610 Unilateral primary osteoarthritis, unspecified hip: Secondary | ICD-10-CM | POA: Insufficient documentation

## 2014-06-29 DIAGNOSIS — Z8701 Personal history of pneumonia (recurrent): Secondary | ICD-10-CM | POA: Insufficient documentation

## 2014-06-29 DIAGNOSIS — M25559 Pain in unspecified hip: Secondary | ICD-10-CM | POA: Insufficient documentation

## 2014-06-29 DIAGNOSIS — Z9889 Other specified postprocedural states: Secondary | ICD-10-CM | POA: Diagnosis not present

## 2014-06-29 DIAGNOSIS — F411 Generalized anxiety disorder: Secondary | ICD-10-CM | POA: Diagnosis not present

## 2014-06-29 DIAGNOSIS — M160 Bilateral primary osteoarthritis of hip: Secondary | ICD-10-CM

## 2014-06-29 DIAGNOSIS — F3289 Other specified depressive episodes: Secondary | ICD-10-CM | POA: Insufficient documentation

## 2014-06-29 DIAGNOSIS — N186 End stage renal disease: Secondary | ICD-10-CM | POA: Insufficient documentation

## 2014-06-29 DIAGNOSIS — D649 Anemia, unspecified: Secondary | ICD-10-CM | POA: Diagnosis not present

## 2014-06-29 DIAGNOSIS — K219 Gastro-esophageal reflux disease without esophagitis: Secondary | ICD-10-CM | POA: Insufficient documentation

## 2014-06-29 DIAGNOSIS — I509 Heart failure, unspecified: Secondary | ICD-10-CM | POA: Diagnosis not present

## 2014-06-29 DIAGNOSIS — M25551 Pain in right hip: Secondary | ICD-10-CM

## 2014-06-29 DIAGNOSIS — I120 Hypertensive chronic kidney disease with stage 5 chronic kidney disease or end stage renal disease: Secondary | ICD-10-CM | POA: Diagnosis not present

## 2014-06-29 DIAGNOSIS — F329 Major depressive disorder, single episode, unspecified: Secondary | ICD-10-CM | POA: Diagnosis not present

## 2014-06-29 DIAGNOSIS — Z113 Encounter for screening for infections with a predominantly sexual mode of transmission: Secondary | ICD-10-CM

## 2014-06-29 DIAGNOSIS — Z79899 Other long term (current) drug therapy: Secondary | ICD-10-CM | POA: Diagnosis not present

## 2014-06-29 DIAGNOSIS — M169 Osteoarthritis of hip, unspecified: Secondary | ICD-10-CM | POA: Diagnosis not present

## 2014-06-29 DIAGNOSIS — I209 Angina pectoris, unspecified: Secondary | ICD-10-CM | POA: Insufficient documentation

## 2014-06-29 DIAGNOSIS — B20 Human immunodeficiency virus [HIV] disease: Secondary | ICD-10-CM

## 2014-06-29 MED ORDER — OXYCODONE-ACETAMINOPHEN 5-325 MG PO TABS
1.0000 | ORAL_TABLET | Freq: Once | ORAL | Status: AC
  Administered 2014-06-29: 1 via ORAL
  Filled 2014-06-29: qty 1

## 2014-06-29 NOTE — ED Provider Notes (Signed)
CSN: [id number]     Arrival date & time 06/29/14  2237 History   First MD Initiated Contact with Patient 06/29/14 2258     Chief Complaint  Patient presents with  . Hip Pain     (Consider location/radiation/quality/duration/timing/severity/associated sxs/prior Treatment) Patient is a 63 y.o. female presenting with hip pain. The history is provided by the patient.  Hip Pain  She states that she has been having pain in her right hip for about the last year. Problem started with her right knee with which she said developed arthritis and has no cartilage in it. She has been in a wheelchair for the last 2 years because of inability to weight on her right knee. Severe pain in her right hip she rates it 10/10. It is worse with movement and she is unable to bear weight. She does state that she fell about one month ago and pain has been worse since then. She took a dose of oxycodone and acetaminophen today with no relief of pain. She has not than anything else for pain.  Past Medical History  Diagnosis Date  . Hypertension   . HIV infection   . Morbid obesity   . ESRD (end stage renal disease) on dialysis 09/30/2013    Started dialysis in [place], [place] around 2009.  ESRD due to HTN vs drug abuse according to pt.  Was on dialysis at [facility] until Feb 2015 when she was admitted to a SNF due to homelessness and drug abuse.  Then changed to [facility] on TTS schedule.     . Anginal pain   . CHF (congestive heart failure)   . Shortness of breath   . Pneumonia   . Depression   . Anxiety   . GERD (gastroesophageal reflux disease)   . Arthritis   . Anemia    Past Surgical History  Procedure Laterality Date  . Arteriovenous graft placement      left forearm  . Cardiac catheterization    . Laparotomy      states seh was cut open because seh was bleeding on the inside   Family History  Problem Relation Age of Onset  . Kidney failure Other     niece  . High blood pressure    . Lung cancer     . Breast cancer Neg Hx   . Colon cancer Neg Hx   . Stroke Mother   . HIV/AIDS Brother     died of AIDS   History  Substance Use Topics  . Smoking status: Current Every Day Smoker -- 0.50 packs/day    Types: Cigarettes    Start date: 05/06/2014  . Smokeless tobacco: Never Used     Comment: recently quit  . Alcohol Use: No   OB History   Grav Para Term Preterm Abortions TAB SAB Ect Mult Living                 Review of Systems  All other systems reviewed and are negative.     Allergies  Tramadol and Morphine and related  Home Medications   Prior to Admission medications   Medication Sig Start Date End Date Taking? Authorizing Provider  amLODipine (NORVASC) 5 MG tablet Take 2 tablets (10 mg total) by mouth at bedtime. 05/12/14   Elease Etienne, MD  calcitRIOL (ROCALTROL) 0.5 MCG capsule Take 4 capsules (2 mcg total) by mouth Every Tuesday,Thursday,and Saturday with dialysis. 05/12/14   Elease Etienne, MD  cinacalcet [facility])  90 MG tablet Take 90 mg by mouth daily.    Historical Provider, MD  colchicine 0.6 MG tablet Take 0.6 mg by mouth daily.    Historical Provider, MD  diphenhydrAMINE (BENADRYL) 50 MG capsule Take 50 mg by mouth at bedtime as needed for sleep.    Historical Provider, MD  FLUoxetine (PROZAC) 20 MG capsule Take 1 capsule (20 mg total) by mouth daily. 05/12/14   Elease Etienne, MD  hydrALAZINE (APRESOLINE) 25 MG tablet Take 1 tablet (25 mg total) by mouth every 8 (eight) hours. 04/17/14   Kathlen Mody, MD  isosorbide mononitrate (IMDUR) 30 MG 24 hr tablet Take 1 tablet (30 mg total) by mouth daily. 05/12/14   Elease Etienne, MD  lamiVUDine (EPIVIR) 10 MG/ML solution Take 25 mg by mouth daily.    Historical Provider, MD  LORazepam (ATIVAN) 0.5 MG tablet Take 1 tablet (0.5 mg total) by mouth daily as needed for anxiety. 05/12/14   Elease Etienne, MD  multivitamin (RENA-VIT) TABS tablet Take 1 tablet by mouth daily. 05/12/14   Elease Etienne, MD  omeprazole  (PRILOSEC) 20 MG capsule Take 20 mg by mouth daily.    Historical Provider, MD  oxyCODONE-acetaminophen (PERCOCET/ROXICET) 5-325 MG per tablet Take 1 tablet by mouth every 4 (four) hours as needed for severe pain.    Historical Provider, MD  polyethylene glycol (MIRALAX / GLYCOLAX) packet Take 17 g by mouth daily as needed for mild constipation. 05/12/14   Elease Etienne, MD  raltegravir (ISENTRESS) 400 MG tablet Take 1 tablet (400 mg total) by mouth 2 (two) times daily. 05/13/14   Gardiner Barefoot, MD  sevelamer carbonate (RENVELA) 800 MG tablet Take 3 tablets (2,400 mg total) by mouth 3 (three) times daily with meals. 05/12/14   Elease Etienne, MD  tenofovir (VIREAD) 300 MG tablet Take 1 tablet (300 mg total) by mouth once a week. 05/13/14   Gardiner Barefoot, MD   BP 164/83  Pulse 93  Temp(Src) 98.1 F (36.7 C)  Resp 18  Ht  (1.626 m)  Wt 240 lb (108.863 kg)  BMI 41.18 kg/m2  SpO2 100% Physical Exam  Nursing note and vitals reviewed.  63 year old female, resting comfortably and in no acute distress. Vital signs are significant for hypertension. Oxygen saturation is 100%, which is normal. Head is normocephalic and atraumatic. PERRLA, EOMI. Oropharynx is clear. Neck is nontender and supple without adenopathy or JVD. Back is nontender and there is no CVA tenderness. Lungs are clear without rales, wheezes, or rhonchi. Chest is nontender. Heart has regular rate and rhythm without murmur. Abdomen is soft, flat, nontender without masses or hepatosplenomegaly and peristalsis is normoactive. Extremities have no cyanosis or edema. There is pain rather diffusely throughout the right hip and right hemipelvis with pain with any passive range of motion. There is also pain with passive range of motion of the right knee distal neurovascular exam is intact with strong pulses, capillary refill, and normal sensation. Skin is warm and dry without rash. Neurologic: Mental status is normal, cranial nerves are  intact, there are no motor or sensory deficits.  ED Course  Procedures (including critical care time)  Imaging Review Dg Hip Complete Right  06/29/2014   CLINICAL DATA:  Righthip pain with no known injury  EXAM: RIGHT HIP - COMPLETE 2+ VIEW  COMPARISON:  None.  FINDINGS: Moderate pubic symphysitis. There is severe right hip joint space narrowing, sclerosis, and subchondral cystic change with  osteophyte formation. There is irregularity of the femoral head with flattening of contour.  On the left side there is moderate hip joint space narrowing.  IMPRESSION: Moderate left and severe right hip arthritis. Possibility of avascular necrosis right femoral head not excluded.   Electronically Signed   By: Esperanza Heir M.D.   On: 06/29/2014 23:54    MDM   Final diagnoses:  Pain in right hip  Osteoarthritis of both hips, unspecified osteoarthritis type  ESRD (end stage renal disease) on dialysis   Right hip pain. This likely is just severe osteoarthritis. Her records are reviewed and she has several visits for knee pain and hip pain and prior x-rays have shown arthritic change of her, she did have a fall x-rays to rule out fracture.  X-rays show osteoarthritis with possible avascular necrosis. She will would benefit from evaluation by an orthopedist but this is not emergent. She got good pain relief with an additional dose of oxycodone and acetaminophen and she is discharged with a prescription for oxycodone and acetaminophen 7.5-325 and is to discuss possible orthopedic referral with her PCP.  Dione Booze, MD 06/30/14 [phone]

## 2014-06-29 NOTE — ED Notes (Signed)
Per EMS- Pt requested to come in for chronic right sided hip/pelvic bone pain for one year, has gotten worse. Pt took a Microbiologist at [facility].

## 2014-06-29 NOTE — ED Notes (Signed)
Pt here from [facility]

## 2014-06-30 ENCOUNTER — Telehealth: Payer: Self-pay | Admitting: Internal Medicine

## 2014-06-30 LAB — CBC WITH DIFFERENTIAL/PLATELET
BASOS ABS: 0.1 10*3/uL (ref 0.0–0.1)
Basophils Relative: 1 % (ref 0–1)
Eosinophils Absolute: 0.1 10*3/uL (ref 0.0–0.7)
Eosinophils Relative: 1 % (ref 0–5)
HCT: 39.6 % (ref 36.0–46.0)
Hemoglobin: 13.3 g/dL (ref 12.0–15.0)
LYMPHS ABS: 2.9 10*3/uL (ref 0.7–4.0)
LYMPHS PCT: 49 % — AB (ref 12–46)
MCH: 33.5 pg (ref 26.0–34.0)
MCHC: 33.6 g/dL (ref 30.0–36.0)
MCV: 99.7 fL (ref 78.0–100.0)
Monocytes Absolute: 0.4 10*3/uL (ref 0.1–1.0)
Monocytes Relative: 7 % (ref 3–12)
NEUTROS ABS: 2.5 10*3/uL (ref 1.7–7.7)
Neutrophils Relative %: 42 % — ABNORMAL LOW (ref 43–77)
PLATELETS: 275 10*3/uL (ref 150–400)
RBC: 3.97 MIL/uL (ref 3.87–5.11)
RDW: 14.4 % (ref 11.5–15.5)
WBC: 5.9 10*3/uL (ref 4.0–10.5)

## 2014-06-30 LAB — COMPLETE METABOLIC PANEL WITH GFR
ALBUMIN: 4.3 g/dL (ref 3.5–5.2)
ALT: 12 U/L (ref 0–35)
AST: 19 U/L (ref 0–37)
Alkaline Phosphatase: 235 U/L — ABNORMAL HIGH (ref 39–117)
BUN: 37 mg/dL — AB (ref 6–23)
CALCIUM: 8.9 mg/dL (ref 8.4–10.5)
CO2: 28 mEq/L (ref 19–32)
CREATININE: 10.27 mg/dL — AB (ref 0.50–1.10)
Chloride: 93 mEq/L — ABNORMAL LOW (ref 96–112)
GFR, EST NON AFRICAN AMERICAN: 4 mL/min — AB
GFR, Est African American: 4 mL/min — ABNORMAL LOW
GLUCOSE: 120 mg/dL — AB (ref 70–99)
POTASSIUM: 6.1 meq/L — AB (ref 3.5–5.3)
Sodium: 134 mEq/L — ABNORMAL LOW (ref 135–145)
Total Bilirubin: 0.5 mg/dL (ref 0.2–1.2)
Total Protein: 7.9 g/dL (ref 6.0–8.3)

## 2014-06-30 LAB — T-HELPER CELL (CD4) - (RCID CLINIC ONLY)
CD4 T CELL HELPER: 37 % (ref 33–55)
CD4 T Cell Abs: 1070 /uL (ref 400–2700)

## 2014-06-30 LAB — LIPID PANEL
Cholesterol: 151 mg/dL (ref 0–200)
HDL: 33 mg/dL — AB (ref >39)
LDL Cholesterol: 68 mg/dL (ref 0–99)
Total CHOL/HDL Ratio: 4.6 Ratio
Triglycerides: 251 mg/dL — ABNORMAL HIGH (ref <150)
VLDL: 50 mg/dL — ABNORMAL HIGH (ref 0–40)

## 2014-06-30 LAB — HIV-1 RNA QUANT-NO REFLEX-BLD: HIV-1 RNA Quant, Log: <1.3 {Log} (ref <1.30)

## 2014-06-30 LAB — RPR

## 2014-06-30 MED ORDER — OXYCODONE-ACETAMINOPHEN 7.5-325 MG PO TABS: 1.0000 | ORAL_TABLET | ORAL | Status: DC | PRN

## 2014-06-30 NOTE — Discharge Instructions (Signed)
Talk with your doctor about possible referral to an orthopedic doctor.   Arthritis, Nonspecific Arthritis is inflammation of a joint. This usually means pain, redness, warmth or swelling are present. One or more joints may be involved. There are a number of types of arthritis. Your caregiver may not be able to tell what type of arthritis you have right away. CAUSES  The most common cause of arthritis is the wear and tear on the joint (osteoarthritis). This causes damage to the cartilage, which can break down over time. The knees, hips, back and neck are most often affected by this type of arthritis. Other types of arthritis and common causes of joint pain include:  Sprains and other injuries near the joint. Sometimes minor sprains and injuries cause pain and swelling that develop hours later.  Rheumatoid arthritis. This affects hands, feet and knees. It usually affects both sides of your body at the same time. It is often associated with chronic ailments, fever, weight loss and general weakness.  Crystal arthritis. Gout and pseudo gout can cause occasional acute severe pain, redness and swelling in the foot, ankle, or knee.  Infectious arthritis. Bacteria can get into a joint through a break in overlying skin. This can cause infection of the joint. Bacteria and viruses can also spread through the blood and affect your joints.  Drug, infectious and allergy reactions. Sometimes joints can become mildly painful and slightly swollen with these types of illnesses. SYMPTOMS   Pain is the main symptom.  Your joint or joints can also be red, swollen and warm or hot to the touch.  You may have a fever with certain types of arthritis, or even feel overall ill.  The joint with arthritis will hurt with movement. Stiffness is present with some types of arthritis. DIAGNOSIS  Your caregiver will suspect arthritis based on your description of your symptoms and on your exam. Testing may be needed to find  the type of arthritis:  Blood and sometimes urine tests.  X-ray tests and sometimes CT or MRI scans.  Removal of fluid from the joint (arthrocentesis) is done to check for bacteria, crystals or other causes. Your caregiver (or a specialist) will numb the area over the joint with a local anesthetic, and use a needle to remove joint fluid for examination. This procedure is only minimally uncomfortable.  Even with these tests, your caregiver may not be able to tell what kind of arthritis you have. Consultation with a specialist (rheumatologist) may be helpful. TREATMENT  Your caregiver will discuss with you treatment specific to your type of arthritis. If the specific type cannot be determined, then the following general recommendations may apply. Treatment of severe joint pain includes:  Rest.  Elevation.  Anti-inflammatory medication (for example, ibuprofen) may be prescribed. Avoiding activities that cause increased pain.  Only take over-the-counter or prescription medicines for pain and discomfort as recommended by your caregiver.  Cold packs over an inflamed joint may be used for 10 to 15 minutes every hour. Hot packs sometimes feel better, but do not use overnight. Do not use hot packs if you are diabetic without your caregiver's permission.  A cortisone shot into arthritic joints may help reduce pain and swelling.  Any acute arthritis that gets worse over the next 1 to 2 days needs to be looked at to be sure there is no joint infection. Long-term arthritis treatment involves modifying activities and lifestyle to reduce joint stress jarring. This can include weight loss. Also, exercise is needed  to nourish the joint cartilage and remove waste. This helps keep the muscles around the joint strong. HOME CARE INSTRUCTIONS   Do not take aspirin to relieve pain if gout is suspected. This elevates uric acid levels.  Only take over-the-counter or prescription medicines for pain, discomfort  or fever as directed by your caregiver.  Rest the joint as much as possible.  If your joint is swollen, keep it elevated.  Use crutches if the painful joint is in your leg.  Drinking plenty of fluids may help for certain types of arthritis.  Follow your caregiver's dietary instructions.  Try low-impact exercise such as:  Swimming.  Water aerobics.  Biking.  Walking.  Morning stiffness is often relieved by a warm shower.  Put your joints through regular range-of-motion. SEEK MEDICAL CARE IF:   You do not feel better in 24 hours or are getting worse.  You have side effects to medications, or are not getting better with treatment. SEEK IMMEDIATE MEDICAL CARE IF:   You have a fever.  You develop severe joint pain, swelling or redness.  Many joints are involved and become painful and swollen.  There is severe back pain and/or leg weakness.  You have loss of bowel or bladder control. Document Released: 11/02/2004 Document Revised: 12/18/2011 Document Reviewed: 11/18/2008 [facility] Patient Information 2015 [place], [place]. This information is not intended to replace advice given to you by your health care provider. Make sure you discuss any questions you have with your health care provider.  Acetaminophen; Oxycodone tablets What is this medicine? ACETAMINOPHEN; OXYCODONE (a set a MEE noe fen; ox i KOE done) is a pain reliever. It is used to treat mild to moderate pain. This medicine may be used for other purposes; ask your health care provider or pharmacist if you have questions. COMMON BRAND NAME(S): Endocet, Magnacet, Narvox, Percocet, Perloxx, Primalev, Primlev, Roxicet, Xolox What should I tell my health care provider before I take this medicine? They need to know if you have any of these conditions: -brain tumor -Crohn's disease, inflammatory bowel disease, or ulcerative colitis -drug abuse or addiction -head injury -heart or circulation problems -if you often  drink alcohol -kidney disease or problems going to the bathroom -liver disease -lung disease, asthma, or breathing problems -an unusual or allergic reaction to acetaminophen, oxycodone, other opioid analgesics, other medicines, foods, dyes, or preservatives -pregnant or trying to get pregnant -breast-feeding How should I use this medicine? Take this medicine by mouth with a full glass of water. Follow the directions on the prescription label. Take your medicine at regular intervals. Do not take your medicine more often than directed. Talk to your pediatrician regarding the use of this medicine in children. Special care may be needed. Patients over 27 years old may have a stronger reaction and need a smaller dose. Overdosage: If you think you have taken too much of this medicine contact a poison control center or emergency room at once. NOTE: This medicine is only for you. Do not share this medicine with others. What if I miss a dose? If you miss a dose, take it as soon as you can. If it is almost time for your next dose, take only that dose. Do not take double or extra doses. What may interact with this medicine? -alcohol -antihistamines -barbiturates like amobarbital, butalbital, butabarbital, methohexital, pentobarbital, phenobarbital, thiopental, and secobarbital -benztropine -drugs for bladder problems like solifenacin, trospium, oxybutynin, tolterodine, hyoscyamine, and methscopolamine -drugs for breathing problems like ipratropium and tiotropium -drugs for certain  stomach or intestine problems like propantheline, homatropine methylbromide, glycopyrrolate, atropine, belladonna, and dicyclomine -general anesthetics like etomidate, ketamine, nitrous oxide, propofol, desflurane, enflurane, halothane, isoflurane, and sevoflurane -medicines for depression, anxiety, or psychotic disturbances -medicines for sleep -muscle relaxants -naltrexone -narcotic medicines (opiates) for  pain -phenothiazines like perphenazine, thioridazine, chlorpromazine, mesoridazine, fluphenazine, prochlorperazine, promazine, and trifluoperazine -scopolamine -tramadol -trihexyphenidyl This list may not describe all possible interactions. Give your health care provider a list of all the medicines, herbs, non-prescription drugs, or dietary supplements you use. Also tell them if you smoke, drink alcohol, or use illegal drugs. Some items may interact with your medicine. What should I watch for while using this medicine? Tell your doctor or health care professional if your pain does not go away, if it gets worse, or if you have new or a different type of pain. You may develop tolerance to the medicine. Tolerance means that you will need a higher dose of the medication for pain relief. Tolerance is normal and is expected if you take this medicine for a long time. Do not suddenly stop taking your medicine because you may develop a severe reaction. Your body becomes used to the medicine. This does NOT mean you are addicted. Addiction is a behavior related to getting and using a drug for a non-medical reason. If you have pain, you have a medical reason to take pain medicine. Your doctor will tell you how much medicine to take. If your doctor wants you to stop the medicine, the dose will be slowly lowered over time to avoid any side effects. You may get drowsy or dizzy. Do not drive, use machinery, or do anything that needs mental alertness until you know how this medicine affects you. Do not stand or sit up quickly, especially if you are an older patient. This reduces the risk of dizzy or fainting spells. Alcohol may interfere with the effect of this medicine. Avoid alcoholic drinks. There are different types of narcotic medicines (opiates) for pain. If you take more than one type at the same time, you may have more side effects. Give your health care provider a list of all medicines you use. Your doctor will  tell you how much medicine to take. Do not take more medicine than directed. Call emergency for help if you have problems breathing. The medicine will cause constipation. Try to have a bowel movement at least every 2 to 3 days. If you do not have a bowel movement for 3 days, call your doctor or health care professional. Do not take Tylenol (acetaminophen) or medicines that have acetaminophen with this medicine. Too much acetaminophen can be very dangerous. Many nonprescription medicines contain acetaminophen. Always read the labels carefully to avoid taking more acetaminophen. What side effects may I notice from receiving this medicine? Side effects that you should report to your doctor or health care professional as soon as possible: -allergic reactions like skin rash, itching or hives, swelling of the face, lips, or tongue -breathing difficulties, wheezing -confusion -light headedness or fainting spells -severe stomach pain -unusually weak or tired -yellowing of the skin or the whites of the eyes Side effects that usually do not require medical attention (report to your doctor or health care professional if they continue or are bothersome): -dizziness -drowsiness -nausea -vomiting This list may not describe all possible side effects. Call your doctor for medical advice about side effects. You may report side effects to FDA at 1-800-FDA-1088. Where should I keep my medicine? Keep out of the reach  of children. This medicine can be abused. Keep your medicine in a safe place to protect it from theft. Do not share this medicine with anyone. Selling or giving away this medicine is dangerous and against the law. Store at room temperature between 20 and 25 degrees C (68 and 77 degrees F). Keep container tightly closed. Protect from light. This medicine may cause accidental overdose and death if it is taken by other adults, children, or pets. Flush any unused medicine down the toilet to reduce the  chance of harm. Do not use the medicine after the expiration date. NOTE: This sheet is a summary. It may not cover all possible information. If you have questions about this medicine, talk to your doctor, pharmacist, or health care provider.  2015, [employer] Standard. (2013-05-19 13:17:35)

## 2014-06-30 NOTE — ED Notes (Signed)
[facility] paged. 

## 2014-06-30 NOTE — Telephone Encounter (Addendum)
   Reason for call:   I received a call from [facility] labs at 5:40 AM indicating [name]'s SCr (that was drawn by Dr. Luciana Axe was a critical value of 10.27.   Pertinent Data:   SCr 10.27, K+ 6.1, patient is ESRD on HD [facility]  Patient was also seen in [facility] tonight for right hip pain   Assessment / Plan / Recommendations:   Patient is ESRD with dialysis schedule to [facility].  No acute need to contact patient or [facility] urgently.  Will forward this note to Dr. Luciana Axe to review, Loney Loh will fax lab results to [facility]    Gust Rung, DO   06/30/2014, 6:32 AM

## 2014-07-04 ENCOUNTER — Emergency Department (HOSPITAL_COMMUNITY): Payer: Medicare Other

## 2014-07-04 ENCOUNTER — Encounter (HOSPITAL_COMMUNITY): Payer: Self-pay | Admitting: Emergency Medicine

## 2014-07-04 ENCOUNTER — Emergency Department (HOSPITAL_COMMUNITY)
Admission: EM | Admit: 2014-07-04 | Discharge: 2014-07-04 | Disposition: A | Discharge status: Home / Self Care | Payer: Medicare Other | Attending: Emergency Medicine | Admitting: Emergency Medicine

## 2014-07-04 DIAGNOSIS — F3289 Other specified depressive episodes: Secondary | ICD-10-CM | POA: Insufficient documentation

## 2014-07-04 DIAGNOSIS — M87051 Idiopathic aseptic necrosis of right femur: Secondary | ICD-10-CM

## 2014-07-04 DIAGNOSIS — Z8701 Personal history of pneumonia (recurrent): Secondary | ICD-10-CM | POA: Diagnosis not present

## 2014-07-04 DIAGNOSIS — I509 Heart failure, unspecified: Secondary | ICD-10-CM | POA: Insufficient documentation

## 2014-07-04 DIAGNOSIS — I120 Hypertensive chronic kidney disease with stage 5 chronic kidney disease or end stage renal disease: Secondary | ICD-10-CM | POA: Insufficient documentation

## 2014-07-04 DIAGNOSIS — K219 Gastro-esophageal reflux disease without esophagitis: Secondary | ICD-10-CM | POA: Diagnosis not present

## 2014-07-04 DIAGNOSIS — M129 Arthropathy, unspecified: Secondary | ICD-10-CM | POA: Insufficient documentation

## 2014-07-04 DIAGNOSIS — I209 Angina pectoris, unspecified: Secondary | ICD-10-CM | POA: Diagnosis not present

## 2014-07-04 DIAGNOSIS — F329 Major depressive disorder, single episode, unspecified: Secondary | ICD-10-CM | POA: Insufficient documentation

## 2014-07-04 DIAGNOSIS — Z9889 Other specified postprocedural states: Secondary | ICD-10-CM | POA: Diagnosis not present

## 2014-07-04 DIAGNOSIS — Z21 Asymptomatic human immunodeficiency virus [HIV] infection status: Secondary | ICD-10-CM | POA: Diagnosis not present

## 2014-07-04 DIAGNOSIS — Z79899 Other long term (current) drug therapy: Secondary | ICD-10-CM | POA: Insufficient documentation

## 2014-07-04 DIAGNOSIS — N186 End stage renal disease: Secondary | ICD-10-CM | POA: Diagnosis not present

## 2014-07-04 DIAGNOSIS — M87059 Idiopathic aseptic necrosis of unspecified femur: Secondary | ICD-10-CM | POA: Diagnosis not present

## 2014-07-04 DIAGNOSIS — R609 Edema, unspecified: Secondary | ICD-10-CM | POA: Diagnosis not present

## 2014-07-04 DIAGNOSIS — D649 Anemia, unspecified: Secondary | ICD-10-CM | POA: Insufficient documentation

## 2014-07-04 DIAGNOSIS — F411 Generalized anxiety disorder: Secondary | ICD-10-CM | POA: Insufficient documentation

## 2014-07-04 DIAGNOSIS — Z992 Dependence on renal dialysis: Secondary | ICD-10-CM | POA: Diagnosis not present

## 2014-07-04 DIAGNOSIS — M25559 Pain in unspecified hip: Secondary | ICD-10-CM | POA: Insufficient documentation

## 2014-07-04 DIAGNOSIS — F17200 Nicotine dependence, unspecified, uncomplicated: Secondary | ICD-10-CM | POA: Insufficient documentation

## 2014-07-04 LAB — CBC WITH DIFFERENTIAL/PLATELET
Basophils Absolute: 0 10*3/uL (ref 0.0–0.1)
Basophils Relative: 0 % (ref 0–1)
EOS ABS: 0.1 10*3/uL (ref 0.0–0.7)
EOS PCT: 1 % (ref 0–5)
HEMATOCRIT: 35.9 % — AB (ref 36.0–46.0)
HEMOGLOBIN: 11.7 g/dL — AB (ref 12.0–15.0)
LYMPHS PCT: 44 % (ref 12–46)
Lymphs Abs: 3.1 10*3/uL (ref 0.7–4.0)
MCH: 33.6 pg (ref 26.0–34.0)
MCHC: 32.6 g/dL (ref 30.0–36.0)
MCV: 103.2 fL — AB (ref 78.0–100.0)
MONO ABS: 0.9 10*3/uL (ref 0.1–1.0)
MONOS PCT: 12 % (ref 3–12)
Neutro Abs: 3.1 10*3/uL (ref 1.7–7.7)
Neutrophils Relative %: 43 % (ref 43–77)
PLATELETS: 250 10*3/uL (ref 150–400)
RBC: 3.48 MIL/uL — AB (ref 3.87–5.11)
RDW: 13.8 % (ref 11.5–15.5)
WBC: 7.2 10*3/uL (ref 4.0–10.5)

## 2014-07-04 LAB — LACTIC ACID, PLASMA: Lactic Acid, Venous: 0.9 mmol/L (ref 0.5–2.2)

## 2014-07-04 LAB — BASIC METABOLIC PANEL
Anion gap: 16 — ABNORMAL HIGH (ref 5–15)
BUN: 44 mg/dL — AB (ref 6–23)
CALCIUM: 9 mg/dL (ref 8.4–10.5)
CO2: 26 meq/L (ref 19–32)
CREATININE: 10.23 mg/dL — AB (ref 0.50–1.10)
Chloride: 96 mEq/L (ref 96–112)
GFR calc Af Amer: 4 mL/min — ABNORMAL LOW (ref >90)
GFR, EST NON AFRICAN AMERICAN: 4 mL/min — AB (ref >90)
GLUCOSE: 132 mg/dL — AB (ref 70–99)
Potassium: 5.5 mEq/L — ABNORMAL HIGH (ref 3.7–5.3)
Sodium: 138 mEq/L (ref 137–147)

## 2014-07-04 LAB — HLA B*5701: HLA-B*5701 w/rflx HLA-B High: NEGATIVE

## 2014-07-04 LAB — CK: Total CK: 54 U/L (ref 7–177)

## 2014-07-04 MED ORDER — HYDROMORPHONE HCL 4 MG PO TABS: 2.0000 mg | ORAL_TABLET | Freq: Two times a day (BID) | ORAL | Status: DC | PRN

## 2014-07-04 MED ORDER — HYDROMORPHONE HCL 1 MG/ML IJ SOLN
2.0000 mg | Freq: Once | INTRAMUSCULAR | Status: AC
  Administered 2014-07-04: 2 mg via INTRAMUSCULAR
  Filled 2014-07-04: qty 2

## 2014-07-04 MED ORDER — HYDROMORPHONE HCL 1 MG/ML IJ SOLN
1.0000 mg | Freq: Once | INTRAMUSCULAR | Status: DC
  Filled 2014-07-04: qty 1

## 2014-07-04 MED ORDER — KETOROLAC TROMETHAMINE 30 MG/ML IJ SOLN
30.0000 mg | Freq: Once | INTRAMUSCULAR | Status: AC
  Administered 2014-07-04: 30 mg via INTRAVENOUS
  Filled 2014-07-04: qty 1

## 2014-07-04 MED ORDER — HYDROMORPHONE HCL 1 MG/ML IJ SOLN
1.0000 mg | Freq: Once | INTRAMUSCULAR | Status: AC
  Administered 2014-07-04: 1 mg via INTRAMUSCULAR

## 2014-07-04 MED ORDER — HYDROMORPHONE HCL 1 MG/ML IJ SOLN
1.0000 mg | Freq: Once | INTRAMUSCULAR | Status: DC
Start: 2014-07-04 — End: 2014-07-04
  Filled 2014-07-04: qty 1

## 2014-07-04 NOTE — ED Notes (Signed)
IV team paged for IV placement for admission

## 2014-07-04 NOTE — ED Notes (Signed)
IV team aware of need for IV 

## 2014-07-04 NOTE — ED Notes (Signed)
Report called to [place] at [facility] [phone])

## 2014-07-04 NOTE — ED Provider Notes (Signed)
CSN: [id number]     Arrival date & time 07/04/14  0051 History   First MD Initiated Contact with Patient 07/04/14 0127     Chief Complaint  Patient presents with  . Hip Pain     (Consider location/radiation/quality/duration/timing/severity/associated sxs/prior Treatment) HPI [name] is a 63 y.o. female with past medical history of HIV, end-stage renal disease, arthritis coming in with right hip pain. Patient states that her pain over the past 3 years after falling. She states that the pain got severely worse over the last hour. She describes as an ache and it radiates down to her knee at times. It is made worse with movement. Patient was recently seen emergency department and sent home with Percocet. She states she's been taking Percocet without any relief. She denies any fevers chills or recent infections. There's been no repeat injury to her hip. She denies any history of abdominal aneurysms. She has no chest pain shortness of breath. Patient has no further complaints.  10 Systems reviewed and are negative for acute change except as noted in the HPI.     Past Medical History  Diagnosis Date  . Hypertension   . HIV infection   . Morbid obesity   . ESRD (end stage renal disease) on dialysis 09/30/2013    Started dialysis in [place], [place] around 2009.  ESRD due to HTN vs drug abuse according to pt.  Was on dialysis at [facility] until Feb 2015 when she was admitted to a SNF due to homelessness and drug abuse.  Then changed to [facility] on TTS schedule.     . Anginal pain   . CHF (congestive heart failure)   . Shortness of breath   . Pneumonia   . Depression   . Anxiety   . GERD (gastroesophageal reflux disease)   . Arthritis   . Anemia    Past Surgical History  Procedure Laterality Date  . Arteriovenous graft placement      left forearm  . Cardiac catheterization    . Laparotomy      states seh was cut open because seh was bleeding on the inside   Family History   Problem Relation Age of Onset  . Kidney failure Other     niece  . High blood pressure    . Lung cancer    . Breast cancer Neg Hx   . Colon cancer Neg Hx   . Stroke Mother   . HIV/AIDS Brother     died of AIDS   History  Substance Use Topics  . Smoking status: Current Every Day Smoker -- 0.50 packs/day    Types: Cigarettes    Start date: 05/06/2014  . Smokeless tobacco: Never Used     Comment: recently quit  . Alcohol Use: No   OB History   Grav Para Term Preterm Abortions TAB SAB Ect Mult Living                 Review of Systems    Allergies  Tramadol and Morphine and related  Home Medications   Prior to Admission medications   Medication Sig Start Date End Date Taking? Authorizing Provider  amLODipine (NORVASC) 5 MG tablet Take 10 mg by mouth at bedtime.   Yes Historical Provider, MD  calcitRIOL (ROCALTROL) 0.5 MCG capsule Take 2 mcg by mouth 3 (three) times a week. Takes on Tuesday, Thursday, and Sat with Dialysis   Yes Historical Provider, MD  cinacalcet (SENSIPAR) 90 MG  tablet Take 90 mg by mouth daily.   Yes Historical Provider, MD  colchicine 0.6 MG tablet Take 0.6 mg by mouth daily.   Yes Historical Provider, MD  diphenhydrAMINE (BENADRYL) 25 mg capsule Take 50 mg by mouth at bedtime as needed for sleep.   Yes Historical Provider, MD  FLUoxetine (PROZAC) 20 MG capsule Take 20 mg by mouth daily.   Yes Historical Provider, MD  folic acid-vitamin b complex-vitamin c-selenium-zinc (DIALYVITE) 3 MG TABS tablet Take 1 tablet by mouth daily.   Yes Historical Provider, MD  hydrALAZINE (APRESOLINE) 25 MG tablet Take 25 mg by mouth every 8 (eight) hours.   Yes Historical Provider, MD  ipratropium-albuterol (DUONEB) 0.5-2.5 (3) MG/3ML SOLN Take 3 mLs by nebulization every 6 (six) hours as needed (shortness of breath and wheezing).   Yes Historical Provider, MD  isosorbide mononitrate (IMDUR) 30 MG 24 hr tablet Take 30 mg by mouth daily.   Yes Historical Provider, MD   lamivudine (EPIVIR-HBV) 5 MG/ML solution Take 25 mg by mouth daily.   Yes Historical Provider, MD  LORazepam (ATIVAN) 0.5 MG tablet Take 0.5 mg by mouth Every Tuesday,Thursday,and Saturday with dialysis.    Yes Historical Provider, MD  LORazepam (ATIVAN) 0.5 MG tablet Take 0.5 mg by mouth every 12 (twelve) hours as needed for anxiety.   Yes Historical Provider, MD  omeprazole (PRILOSEC) 20 MG capsule Take 20 mg by mouth daily.   Yes Historical Provider, MD  oxyCODONE-acetaminophen (PERCOCET) 7.5-325 MG per tablet Take 1 tablet by mouth every 4 (four) hours as needed for pain. 06/30/14  Yes Dione Booze, MD  oxyCODONE-acetaminophen (PERCOCET/ROXICET) 5-325 MG per tablet Take 1 tablet by mouth every 8 (eight) hours as needed for severe pain.   Yes Historical Provider, MD  polyethylene glycol (MIRALAX / GLYCOLAX) packet Take 17 g by mouth daily as needed for mild constipation.   Yes Historical Provider, MD  raltegravir (ISENTRESS) 400 MG tablet Take 400 mg by mouth 2 (two) times daily.   Yes Historical Provider, MD  sevelamer carbonate (RENVELA) 800 MG tablet Take 2,400 mg by mouth 3 (three) times daily with meals.   Yes Historical Provider, MD  tenofovir (VIREAD) 300 MG tablet Take 300 mg by mouth once a week. Give on Friday   Yes Historical Provider, MD   BP 136/77  Pulse 97  Temp(Src) 98 F (36.7 C) (Oral)  Resp 24  SpO2 95% Physical Exam  Nursing note and vitals reviewed. Constitutional: She is oriented to person, place, and time. She appears well-developed and well-nourished. She appears distressed.  Patient appears to be in pain complaining about her right hip.  HENT:  Head: Normocephalic and atraumatic.  Eyes: Conjunctivae and EOM are normal. Pupils are equal, round, and reactive to light. No scleral icterus.  Neck: Normal range of motion. Neck supple. No JVD present. No tracheal deviation present. No thyromegaly present.  Cardiovascular: Normal rate, regular rhythm and normal heart  sounds.  Exam reveals no gallop and no friction rub.   No murmur heard. Pulmonary/Chest: Effort normal and breath sounds normal. No respiratory distress. She has no wheezes. She exhibits no tenderness.  Abdominal: Soft. Bowel sounds are normal. She exhibits no distension and no mass. There is no tenderness. There is no rebound and no guarding.  Musculoskeletal: Normal range of motion. She exhibits edema. She exhibits no tenderness.  Left forearm fistula with palpable thrill. 1+ bilateral extremity edema.  Lymphadenopathy:    She has no cervical adenopathy.  Neurological: She  is alert and oriented to person, place, and time. No cranial nerve deficit. She exhibits normal muscle tone.  Skin: Skin is warm and dry. No rash noted. She is not diaphoretic. No erythema. No pallor.    ED Course  Procedures (including critical care time) Labs Review Labs Reviewed  CBC WITH DIFFERENTIAL - Abnormal; Notable for the following:    RBC 3.48 (*)    Hemoglobin 11.7 (*)    HCT 35.9 (*)    MCV 103.2 (*)    All other components within normal limits  BASIC METABOLIC PANEL - Abnormal; Notable for the following:    Potassium 5.5 (*)    Glucose, Bld 132 (*)    BUN 44 (*)    Creatinine, Ser 10.23 (*)    GFR calc non Af Amer 4 (*)    GFR calc Af Amer 4 (*)    Anion gap 16 (*)    All other components within normal limits  LACTIC ACID, PLASMA  CK    Imaging Review Dg Hip Complete Right  07/04/2014   CLINICAL DATA:  Increased pain in the hip.  Possible AVN.  EXAM: RIGHT HIP - COMPLETE 2+ VIEW  COMPARISON:  06/29/2014  FINDINGS: Degenerative changes in both hips, greater on the right. The right hip demonstrates evidence of bone destruction and sclerosis involving the acetabular and femoral sides. This may be related to degenerative changes and avascular necrosis. Appearance is unchanged since previous study dated 07/31/2013, making acute infectious process unlikely. No acute fracture or dislocation. Pelvis  appears intact.  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 No significant change.   Electronically Signed   By: Burman Nieves M.D.   On: 07/04/2014 02:36     EKG Interpretation None      MDM   Final diagnoses:  None    She does emergency department out of concern for worsening right hip pain. She states it is worse than it was 4 days ago. At that time her x-ray as read as possible avascular necrosis. Will obtain repeat x-ray to assess her joints. She was given Dilaudid for pain control.  She required an additional dose of dilaudid and toradol in the ED for pain control.  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  Dr. Roda Shutters was consulted with ortho and he does not find an emergent need to admit the patient for surgical repair as the patient needs several pre-op testing done.  Her pain was adequately controlled in the ED and she was found resting comfortably.  She states she feels comfortable going home with dilaudid medication as needed for pain.  She will follow up with Dr. Roda Shutters in clinic within the next week.  Her vital signs remain within her normal limits, O2 sat dropped while sleeping in the room and rise when she is awaken, patient is safe for discharge.    Tomasita Crumble, MD 07/04/14 [phone]

## 2014-07-04 NOTE — ED Notes (Addendum)
IV team here to attempt IV

## 2014-07-04 NOTE — ED Notes (Signed)
[place] IV attempted x 2 by two different RNs; pt requesting removal of IV after placement and requesting IM pain medication, MD aware

## 2014-07-04 NOTE — ED Notes (Signed)
Attempted IV, good veins, poorly tolerated, unable to proceed, pt immediately asked IV to be taken out, pt requesting "shot", "does not want IV".

## 2014-07-04 NOTE — ED Notes (Signed)
Pt given apple juice per Dr.Oni

## 2014-07-04 NOTE — ED Notes (Signed)
Pt to ED from [facility] [place] via [facility] c/o R hip and r knee  pain. Pt reports chronic R hip pain that has not been relieved with Percocet, Tylenol, or Goody powders. Denies injury to area

## 2014-07-04 NOTE — Discharge Instructions (Signed)
Avascular Necrosis [name], you were seen today for hip pain.  This is likely due to your worsening arthritis and decreased blood flow to your hip.  You need to follow up with orthopaedic surgery within 3 days for continued care and possible surgery.  If your symptoms worsen, come back to the ED for repeat evaluation.  Thank you. Avascular necrosis is a disease resulting from the temporary or permanent loss of the blood supply to the bones. Without blood, the bone tissue dies and causes the bone to become soft. If the process involves the bone near a joint, it may lead to collapse of the joint surface. This disease is also known as:  Osteonecrosis.  Aseptic necrosis.  Ischemic bone necrosis. Avascular necrosis most commonly affects the ends (epiphysis) of long bones. The femur, the bone extending from the knee joint to the hip joint, is the bone most commonly involved. The disease may affect 1 bone, more than 1 bone at the same time, more than 1 bone at different times. It affects men and women equally. Avascular necrosis occurs at any age. But it is more common between the ages of 45 and 50 years. SYMPTOMS  In early stages patients may not have any symptoms. But as the disease progresses, joint pain generally develops. At first there is pain when putting weight on the affected joint, and then when resting. Pain usually develops gradually. It may be mild or severe. As the disease progresses and the bone and surrounding joint surface collapses, pain may develop or increase dramatically. Pain may be severe enough to limit range of motion in the affected joint. The period of time between the first symptoms and loss of joint function is different for each patient. This can range from several months to more than a year. Disability depends on:  What part of the bone is affected.  How large an area is involved.  How effectively the bone repairs itself.  If other illnesses are present.  If you are  being treated for cancer with medications (chemotherapy).  Radiation.  The cause of the avascular necrosis. DIAGNOSIS  The diagnosis of aseptic necrosis is usually made by:  Taking a history.  Doing an exam.  Taking X-rays. (If X-rays are normal, an MRI may be required.)  Sometimes further blood work and specialized studies may be necessary. TREATMENT  Treatment for this disease is necessary to maintain joint function. If untreated, most patients will suffer severe pain and limitation in movement within 2 years. Several treatments are available that help prevent further bone and joint damage. They can also reduce pain. To determine the most appropriate treatment, the caregiver considers the following aspects of a patient's disease:  The age of the patient.  The stage of the disease (early or late).  The location and amount of bone affected. It may be a small or large area.  The underlying cause of avascular necrosis. The goals in treatment are to:  Improve the patient's use of the affected joint.  Stop further damage to the bone.  Improve bone and joint survival. Your caregiver may use one or more of the following treatments:  Reduced weight bearing. If avascular necrosis is diagnosed early, the caregiver may begin treatment by having the patient limit weight on the affected joint. The caregiver may recommend limiting activities or using crutches. In some cases, reduced weight bearing can slow the damage caused by the disease and permit natural healing. When combined with medication to reduce pain, reduced  weight bearing can be an effective way to avoid or delay surgery for some patients. Most patients eventually will need surgery to reconstruct the joint.  Core decompression. Core decompression works best in people who are in the earliest stages of avascular necrosis, before the collapse of the joint. This procedure often can reduce pain and slow the progression of bone and joint  destruction in these patients. This surgical procedure removes the inner layer of bone, which:  Reduces pressure within the bone.  Increases blood flow to the bone.  Allows more blood vessels to form.  Reduces pain.  Osteotomy. This surgical procedure re-shapes the bone to reduce stress on the affected area of the joint. There is a lengthy recovery period. The patient's activities are very limited for 3 to 12 months after an osteotomy. This procedure is most effective for younger patients with advanced avascular necrosis, and those with a large area of affected bone.  Bone Graft. A bone graft may be used to support a joint after core decompression. Bone grafting is surgery that transplants healthy bone from one part of the patient, such as the leg, to the diseased area. Sometimes the bone is taken with it's blood vessels which are attached to local blood vessels near the area of bone collapse. This is called a vascularized bone graft. There is a lengthy recovery period after a bone graft, usually from 6 to 12 months. This procedure is technically complex.  Arthroplasty. Arthroplasty is also known as total joint replacement. Total joint replacement is used in late-stage avascular necrosis, and when the joint is deformed. In this surgery, the diseased joint is replaced with artificial parts. It may be recommended for people who are not good candidates for other treatments, such as patients who may not do well with repeated attempts to preserve the joint. Various types of replacements are available, and patients should discuss specific needs with their caregiver. New treatments being tried include:  The use of medications.  Electrical stimulation.  Combination therapies to increase the growth of new bone and blood vessels. Document Released: 03/17/2002 Document Revised: 12/18/2011 Document Reviewed: 12/03/2013 [facility] Patient Information 2015 [place], [place]. This information is not intended to  replace advice given to you by your health care provider. Make sure you discuss any questions you have with your health care provider.

## 2014-07-04 NOTE — ED Notes (Signed)
Dr. Oni at bedside. 

## 2014-07-12 ENCOUNTER — Encounter (HOSPITAL_COMMUNITY): Payer: Self-pay | Admitting: Emergency Medicine

## 2014-07-12 ENCOUNTER — Emergency Department (HOSPITAL_COMMUNITY)
Admission: EM | Admit: 2014-07-12 | Discharge: 2014-07-12 | Disposition: A | Discharge status: Home / Self Care | Payer: Medicare Other | Attending: Emergency Medicine | Admitting: Emergency Medicine

## 2014-07-12 ENCOUNTER — Non-Acute Institutional Stay (HOSPITAL_COMMUNITY)
Admission: EM | Admit: 2014-07-12 | Discharge: 2014-07-12 | Disposition: A | Discharge status: Home / Self Care | Payer: Medicare Other | Attending: Emergency Medicine | Admitting: Emergency Medicine

## 2014-07-12 ENCOUNTER — Emergency Department (HOSPITAL_COMMUNITY): Payer: Medicare Other

## 2014-07-12 DIAGNOSIS — F17210 Nicotine dependence, cigarettes, uncomplicated: Secondary | ICD-10-CM | POA: Diagnosis not present

## 2014-07-12 DIAGNOSIS — M79609 Pain in unspecified limb: Secondary | ICD-10-CM | POA: Insufficient documentation

## 2014-07-12 DIAGNOSIS — B20 Human immunodeficiency virus [HIV] disease: Secondary | ICD-10-CM | POA: Diagnosis not present

## 2014-07-12 DIAGNOSIS — M1990 Unspecified osteoarthritis, unspecified site: Secondary | ICD-10-CM | POA: Diagnosis not present

## 2014-07-12 DIAGNOSIS — R6889 Other general symptoms and signs: Secondary | ICD-10-CM

## 2014-07-12 DIAGNOSIS — E875 Hyperkalemia: Secondary | ICD-10-CM | POA: Diagnosis present

## 2014-07-12 DIAGNOSIS — Z21 Asymptomatic human immunodeficiency virus [HIV] infection status: Secondary | ICD-10-CM | POA: Insufficient documentation

## 2014-07-12 DIAGNOSIS — I509 Heart failure, unspecified: Secondary | ICD-10-CM | POA: Insufficient documentation

## 2014-07-12 DIAGNOSIS — I120 Hypertensive chronic kidney disease with stage 5 chronic kidney disease or end stage renal disease: Secondary | ICD-10-CM | POA: Diagnosis not present

## 2014-07-12 DIAGNOSIS — Z6841 Body Mass Index (BMI) 40.0 and over, adult: Secondary | ICD-10-CM | POA: Insufficient documentation

## 2014-07-12 DIAGNOSIS — Z79899 Other long term (current) drug therapy: Secondary | ICD-10-CM | POA: Diagnosis not present

## 2014-07-12 DIAGNOSIS — F329 Major depressive disorder, single episode, unspecified: Secondary | ICD-10-CM | POA: Diagnosis not present

## 2014-07-12 DIAGNOSIS — D649 Anemia, unspecified: Secondary | ICD-10-CM | POA: Insufficient documentation

## 2014-07-12 DIAGNOSIS — Z720 Tobacco use: Secondary | ICD-10-CM | POA: Diagnosis not present

## 2014-07-12 DIAGNOSIS — Z8701 Personal history of pneumonia (recurrent): Secondary | ICD-10-CM | POA: Diagnosis not present

## 2014-07-12 DIAGNOSIS — Z992 Dependence on renal dialysis: Secondary | ICD-10-CM | POA: Diagnosis not present

## 2014-07-12 DIAGNOSIS — Z9889 Other specified postprocedural states: Secondary | ICD-10-CM | POA: Insufficient documentation

## 2014-07-12 DIAGNOSIS — F419 Anxiety disorder, unspecified: Secondary | ICD-10-CM | POA: Insufficient documentation

## 2014-07-12 DIAGNOSIS — N186 End stage renal disease: Secondary | ICD-10-CM | POA: Insufficient documentation

## 2014-07-12 DIAGNOSIS — K219 Gastro-esophageal reflux disease without esophagitis: Secondary | ICD-10-CM | POA: Insufficient documentation

## 2014-07-12 DIAGNOSIS — I209 Angina pectoris, unspecified: Secondary | ICD-10-CM | POA: Diagnosis not present

## 2014-07-12 DIAGNOSIS — E8779 Other fluid overload: Secondary | ICD-10-CM

## 2014-07-12 DIAGNOSIS — M79603 Pain in arm, unspecified: Secondary | ICD-10-CM | POA: Diagnosis not present

## 2014-07-12 LAB — BASIC METABOLIC PANEL WITH GFR
Anion gap: 17 — ABNORMAL HIGH (ref 5–15)
BUN: 66 mg/dL — ABNORMAL HIGH (ref 6–23)
CO2: 26 meq/L (ref 19–32)
Calcium: 9.3 mg/dL (ref 8.4–10.5)
Chloride: 93 meq/L — ABNORMAL LOW (ref 96–112)
Creatinine, Ser: 12.41 mg/dL — ABNORMAL HIGH (ref 0.50–1.10)
GFR calc Af Amer: 3 mL/min — ABNORMAL LOW
GFR calc non Af Amer: 3 mL/min — ABNORMAL LOW
Glucose, Bld: 95 mg/dL (ref 70–99)
Potassium: 7.3 meq/L (ref 3.7–5.3)
Sodium: 136 meq/L — ABNORMAL LOW (ref 137–147)

## 2014-07-12 LAB — BASIC METABOLIC PANEL
Anion gap: 18 — ABNORMAL HIGH (ref 5–15)
BUN: 69 mg/dL — ABNORMAL HIGH (ref 6–23)
CO2: 23 mEq/L (ref 19–32)
Calcium: 9.1 mg/dL (ref 8.4–10.5)
Chloride: 93 mEq/L — ABNORMAL LOW (ref 96–112)
Creatinine, Ser: 12.46 mg/dL — ABNORMAL HIGH (ref 0.50–1.10)
GFR calc Af Amer: 3 mL/min — ABNORMAL LOW (ref >90)
GFR calc non Af Amer: 3 mL/min — ABNORMAL LOW (ref >90)
Glucose, Bld: 59 mg/dL — ABNORMAL LOW (ref 70–99)
Potassium: 6.4 mEq/L — ABNORMAL HIGH (ref 3.7–5.3)
Sodium: 134 mEq/L — ABNORMAL LOW (ref 137–147)

## 2014-07-12 LAB — I-STAT TROPONIN, ED: Troponin i, poc: 0.02 ng/mL (ref 0.00–0.08)

## 2014-07-12 LAB — CBC
HEMATOCRIT: 35.8 % — AB (ref 36.0–46.0)
Hemoglobin: 11.8 g/dL — ABNORMAL LOW (ref 12.0–15.0)
MCH: 35.1 pg — AB (ref 26.0–34.0)
MCHC: 33 g/dL (ref 30.0–36.0)
MCV: 106.5 fL — ABNORMAL HIGH (ref 78.0–100.0)
Platelets: 276 10*3/uL (ref 150–400)
RBC: 3.36 MIL/uL — AB (ref 3.87–5.11)
RDW: 14 % (ref 11.5–15.5)
WBC: 7.7 10*3/uL (ref 4.0–10.5)

## 2014-07-12 LAB — I-STAT CHEM 8, ED
BUN: 29 mg/dL — ABNORMAL HIGH (ref 6–23)
CALCIUM ION: 1.05 mmol/L — AB (ref 1.13–1.30)
CHLORIDE: 100 meq/L (ref 96–112)
CREATININE: 6 mg/dL — AB (ref 0.50–1.10)
Glucose, Bld: 98 mg/dL (ref 70–99)
HCT: 36 % (ref 36.0–46.0)
Hemoglobin: 12.2 g/dL (ref 12.0–15.0)
POTASSIUM: 4.1 meq/L (ref 3.7–5.3)
Sodium: 139 mEq/L (ref 137–147)
TCO2: 28 mmol/L (ref 0–100)

## 2014-07-12 LAB — HEPATITIS B SURFACE ANTIGEN: Hepatitis B Surface Ag: NEGATIVE

## 2014-07-12 LAB — PRO B NATRIURETIC PEPTIDE: PRO B NATRI PEPTIDE: 9434 pg/mL — AB (ref 0–125)

## 2014-07-12 MED ORDER — LIDOCAINE-PRILOCAINE 2.5-2.5 % EX CREA: 1.0000 "application " | TOPICAL_CREAM | CUTANEOUS | Status: DC | PRN

## 2014-07-12 MED ORDER — HEPARIN SODIUM (PORCINE) 1000 UNIT/ML DIALYSIS: 1000.0000 [IU] | INTRAMUSCULAR | Status: DC | PRN

## 2014-07-12 MED ORDER — DEXTROSE 50 % IV SOLN
1.0000 | Freq: Once | INTRAVENOUS | Status: AC
  Administered 2014-07-12: 50 mL via INTRAVENOUS
  Filled 2014-07-12: qty 50

## 2014-07-12 MED ORDER — ALTEPLASE 2 MG IJ SOLR
2.0000 mg | Freq: Once | INTRAMUSCULAR | Status: DC | PRN
Start: 2014-07-12 — End: 2014-07-12
  Filled 2014-07-12: qty 2

## 2014-07-12 MED ORDER — HEPARIN SODIUM (PORCINE) 1000 UNIT/ML DIALYSIS: 20.0000 [IU]/kg | INTRAMUSCULAR | Status: DC | PRN

## 2014-07-12 MED ORDER — INSULIN ASPART 100 UNIT/ML ~~LOC~~ SOLN
10.0000 [IU] | Freq: Once | SUBCUTANEOUS | Status: AC
  Administered 2014-07-12: 10 [IU] via INTRAVENOUS
  Filled 2014-07-12: qty 1

## 2014-07-12 MED ORDER — LIDOCAINE HCL (PF) 1 % IJ SOLN
5.0000 mL | INTRAMUSCULAR | Status: DC | PRN
Start: 2014-07-12 — End: 2014-07-12

## 2014-07-12 MED ORDER — SODIUM CHLORIDE 0.9 % IV SOLN: 100.0000 mL | INTRAVENOUS | Status: DC | PRN

## 2014-07-12 MED ORDER — NEPRO/CARBSTEADY PO LIQD: 237.0000 mL | ORAL | Status: DC | PRN

## 2014-07-12 MED ORDER — OXYCODONE-ACETAMINOPHEN 5-325 MG PO TABS
2.0000 | ORAL_TABLET | Freq: Once | ORAL | Status: AC
  Administered 2014-07-12: 2 via ORAL
  Filled 2014-07-12: qty 2

## 2014-07-12 MED ORDER — PENTAFLUOROPROP-TETRAFLUOROETH EX AERO: 1.0000 "application " | INHALATION_SPRAY | CUTANEOUS | Status: DC | PRN

## 2014-07-12 NOTE — ED Notes (Signed)
Pt returned from xray

## 2014-07-12 NOTE — Progress Notes (Signed)
Pt getting hemodialysis. I am not able to pull much fluid from her because her BP drops frequently. Pt is upset about not being admitted to the hospital and states that if she goes home with this extra fluid on her she will. Die. I attempted to reach the phycisian that saw her but they have left for the day. I called back to the ER to see if they had any recommendations as the pt is up here crying stating that she needs to be admitted. I [facility] and he informed me that if the pt felt that she needed to be admitted then  She would have to revisit the ER and be seen by an ER doctor again. ER charge nurse called and informed that I will be bringing the pt back to the ER to be worked up at the pt request when hemodialysis is complete. Pt states she has recently lost her home and has no transportation to her dialysis unit and fears that she will dye if she goes home.

## 2014-07-12 NOTE — ED Notes (Signed)
Pt discharged via [facility] home.

## 2014-07-12 NOTE — ED Notes (Signed)
Spoke with Jodie from Social work, will attempt to coordinate transport from patients daughters house to dialysis.

## 2014-07-12 NOTE — Progress Notes (Signed)
Pt refusing transportation home. States she can not walk and the transportation does not have a wheelchair for her. States she continues to feel poor and feels she wiill dye if she goes home. Have called  Dr. Lowell GuitarPowell and the ER nulrse about this. Now that her HD Tx is complete, I am taking her back to the ER to go through triage to be re evaluated for admission as pt is requesting. IV remains in the pts R arm and the ER charge nurse Clydie BraunKaren is aware of this. Pts daughter Hardie LoraLavern has also been informed of her going back to the ER at pts request.

## 2014-07-12 NOTE — ED Provider Notes (Addendum)
CSN: [id number]     Arrival date & time 07/12/14  1204 History   First MD Initiated Contact with Patient 07/12/14 1237     Chief Complaint  Patient presents with  . Chest Pain     (Consider location/radiation/quality/duration/timing/severity/associated sxs/prior Treatment) Patient is a 63 y.o. female presenting with chest pain. The history is provided by the patient.  Chest Pain Pain location:  L chest Pain quality: sharp and shooting   Pain radiates to:  Does not radiate Pain radiates to the back: no   Pain severity:  Moderate Onset quality:  Sudden Duration:  1 minute Timing:  Intermittent Progression:  Unchanged Chronicity:  Recurrent Context: stress   Context comment:  Started this morning.  states that she has been stressed because she was not allowed to return to her apt and having to stay with her daughter and missed dialysis yesterday Relieved by:  None tried Worsened by:  Coughing and deep breathing Ineffective treatments:  None tried Associated symptoms: cough, lower extremity edema, shortness of breath and vomiting   Associated symptoms: no abdominal pain, no fever and no headache   Associated symptoms comment:  2 weeks of cough.  1 episode of vomiting yesterday Risk factors: hypertension and smoking   Risk factors: no immobilization and no surgery     Past Medical History  Diagnosis Date  . Hypertension   . HIV infection   . Morbid obesity   . ESRD (end stage renal disease) on dialysis 09/30/2013    Started dialysis in [place], [place] around 2009.  ESRD due to HTN vs drug abuse according to pt.  Was on dialysis at [facility] until Feb 2015 when she was admitted to a SNF due to homelessness and drug abuse.  Then changed to [facility] on TTS schedule.     . Anginal pain   . CHF (congestive heart failure)   . Shortness of breath   . Pneumonia   . Depression   . Anxiety   . GERD (gastroesophageal reflux disease)   . Arthritis   . Anemia    Past Surgical  History  Procedure Laterality Date  . Arteriovenous graft placement      left forearm  . Cardiac catheterization    . Laparotomy      states seh was cut open because seh was bleeding on the inside   Family History  Problem Relation Age of Onset  . Kidney failure Other     niece  . High blood pressure    . Lung cancer    . Breast cancer Neg Hx   . Colon cancer Neg Hx   . Stroke Mother   . HIV/AIDS Brother     died of AIDS   History  Substance Use Topics  . Smoking status: Current Every Day Smoker -- 0.50 packs/day    Types: Cigarettes    Start date: 05/06/2014  . Smokeless tobacco: Never Used     Comment: recently quit  . Alcohol Use: No   OB History   Grav Para Term Preterm Abortions TAB SAB Ect Mult Living                 Review of Systems  Constitutional: Negative for fever.  Respiratory: Positive for cough and shortness of breath.   Cardiovascular: Positive for chest pain.  Gastrointestinal: Positive for vomiting. Negative for abdominal pain.  Neurological: Negative for headaches.  All other systems reviewed and are negative.     Allergies  Tramadol and Morphine and related  Home Medications   Prior to Admission medications   Medication Sig Start Date End Date Taking? Authorizing Provider  amLODipine (NORVASC) 5 MG tablet Take 10 mg by mouth at bedtime.     Historical Provider, MD  calcitRIOL (ROCALTROL) 0.5 MCG capsule Take 2 mcg by mouth 3 (three) times a week. Takes on Tuesday, Thursday, and Sat with Dialysis    Historical Provider, MD  cinacalcet (SENSIPAR) 90 MG tablet Take 90 mg by mouth daily.    Historical Provider, MD  colchicine 0.6 MG tablet Take 0.6 mg by mouth daily.    Historical Provider, MD  diphenhydrAMINE (BENADRYL) 25 mg capsule Take 50 mg by mouth at bedtime as needed for sleep.    Historical Provider, MD  FLUoxetine (PROZAC) 20 MG capsule Take 20 mg by mouth daily.    Historical Provider, MD  folic acid-vitamin b complex-vitamin  c-selenium-zinc (DIALYVITE) 3 MG TABS tablet Take 1 tablet by mouth daily.    Historical Provider, MD  hydrALAZINE (APRESOLINE) 25 MG tablet Take 25 mg by mouth every 8 (eight) hours.    Historical Provider, MD  HYDROmorphone (DILAUDID) 4 MG tablet Take 0.5 tablets (2 mg total) by mouth 2 (two) times daily as needed for severe pain. 07/04/14   Tomasita CrumbleAdeleke Oni, MD  ipratropium-albuterol (DUONEB) 0.5-2.5 (3) MG/3ML SOLN Take 3 mLs by nebulization every 6 (six) hours as needed (shortness of breath and wheezing).    Historical Provider, MD  isosorbide mononitrate (IMDUR) 30 MG 24 hr tablet Take 30 mg by mouth daily.    Historical Provider, MD  lamivudine (EPIVIR-HBV) 5 MG/ML solution Take 25 mg by mouth daily.    Historical Provider, MD  LORazepam (ATIVAN) 0.5 MG tablet Take 0.5 mg by mouth Every Tuesday,Thursday,and Saturday with dialysis.     Historical Provider, MD  LORazepam (ATIVAN) 0.5 MG tablet Take 0.5 mg by mouth every 12 (twelve) hours as needed for anxiety.    Historical Provider, MD  omeprazole (PRILOSEC) 20 MG capsule Take 20 mg by mouth daily.    Historical Provider, MD  oxyCODONE-acetaminophen (PERCOCET) 7.5-325 MG per tablet Take 1 tablet by mouth every 4 (four) hours as needed for pain. 06/30/14   Dione Boozeavid Glick, MD  oxyCODONE-acetaminophen (PERCOCET/ROXICET) 5-325 MG per tablet Take 1 tablet by mouth every 8 (eight) hours as needed for severe pain.    Historical Provider, MD  polyethylene glycol (MIRALAX / GLYCOLAX) packet Take 17 g by mouth daily as needed for mild constipation.    Historical Provider, MD  raltegravir (ISENTRESS) 400 MG tablet Take 400 mg by mouth 2 (two) times daily.    Historical Provider, MD  sevelamer carbonate (RENVELA) 800 MG tablet Take 2,400 mg by mouth 3 (three) times daily with meals.    Historical Provider, MD  tenofovir (VIREAD) 300 MG tablet Take 300 mg by mouth once a week. Give on Friday    Historical Provider, MD   BP 156/83  Pulse 102  Temp(Src) 98.4 F (36.9  C) (Oral)  Resp 20  Ht 5\' 4"  (1.626 m)  Wt 250 lb (113.399 kg)  BMI 42.89 kg/m2  SpO2 92% Physical Exam  Nursing note and vitals reviewed. Constitutional: She is oriented to person, place, and time. She appears well-developed and well-nourished. No distress.  HENT:  Head: Normocephalic and atraumatic.  Mouth/Throat: Oropharynx is clear and moist.  Eyes: Conjunctivae and EOM are normal. Pupils are equal, round, and reactive to light.  Neck: Normal range of motion.  Neck supple.  Cardiovascular: Regular rhythm and intact distal pulses.  Tachycardia present.   No murmur heard. Pulmonary/Chest: Effort normal and breath sounds normal. No respiratory distress. She has no wheezes. She has no rales.  Abdominal: Soft. She exhibits no distension. There is no tenderness. There is no rebound and no guarding.  Musculoskeletal: Normal range of motion. She exhibits edema and tenderness.  Mature fistula in the left [facility] with palpable thrill.  Mild swelling over the dorsum of the left foot with mild erythema,  Trace lower ext edema.  Neurological: She is alert and oriented to person, place, and time.  Skin: Skin is warm and dry. No rash noted. No erythema.  Psychiatric: She has a normal mood and affect. Her behavior is normal.    ED Course  Procedures (including critical care time) Labs Review Labs Reviewed  CBC - Abnormal; Notable for the following:    RBC 3.36 (*)    Hemoglobin 11.8 (*)    HCT 35.8 (*)    MCV 106.5 (*)    MCH 35.1 (*)    All other components within normal limits  BASIC METABOLIC PANEL - Abnormal; Notable for the following:    Sodium 136 (*)    Potassium 7.3 (*)    Chloride 93 (*)    BUN 66 (*)    Creatinine, Ser 12.41 (*)    GFR calc non Af Amer 3 (*)    GFR calc Af Amer 3 (*)    Anion gap 17 (*)    All other components within normal limits  PRO B NATRIURETIC PEPTIDE - Abnormal; Notable for the following:    Pro B Natriuretic peptide (BNP) 9434.0 (*)    All other  components within normal limits  I-STAT TROPOININ, ED    Imaging Review Dg Chest 2 View  07/12/2014   CLINICAL DATA:  Chest pain and non rib productive cough with shortness breath of uncertain duration ; history of end-stage renal disease on dialysis ; age IV, CHF rule.  EXAM: CHEST  2 VIEW  COMPARISON:  PA and lateral chest x-ray of June 05, 2014  FINDINGS: The lungs are adequately inflated. The interstitial markings are increased. There is subsegmental atelectasis in the mid lungs bilaterally. There is mild thickening of the minor fissure on the right. There is no pleural effusion. The cardiac silhouette is enlarged. The pulmonary vascularity is engorged. There is tortuosity of the descending thoracic aorta. There is generalized sclerosis of the thoracic vertebral bodies.  IMPRESSION: Congestive heart failure with mild pulmonary interstitial and early alveolar edema. There is no alveolar pneumonia.   Electronically Signed   By: David  [place]   On: 07/12/2014 13:35     EKG Interpretation   Date/Time:  Sunday July 12 2014 12:15:33 EDT Ventricular Rate:  102 PR Interval:  176 QRS Duration: 117 QT Interval:  378 QTC Calculation: 492 R Axis:   23 Text Interpretation:  Sinus tachycardia LVH with IVCD and secondary repol  abnrm Borderline ST elevation, inferior leads Borderline prolonged QT  interval Incomplete left bundle branch block Confirmed by Anitra Lauth  MD,  Alphonzo Lemmings ([id number]) on 07/12/2014 12:25:08 PM      MDM   Final diagnoses:  ESRD (end stage renal disease)  Hyperkalemia  Other hypervolemia    Patient here with multiple medical problems including end-stage renal disease on dialysis, HIV, CHF who presents today after missing dialysis yesterday with a cough, shortness of breath and intermittent sharp chest pain. Patient has also multiple complaints  about pain in her extremities. She did note pain in the dorsum of her left foot starting this morning without trauma.  The chest pain  is brief and intermittent that she describes as a sharp shooting pain.  Patient states she missed dialysis because she is having to stay with her daughter because she was not allowed to go back to [facility] because her check did not come on Friday. She also complains of ongoing pain in her right hip due to avascular necrosis and has an appointment with orthopedics tomorrow.  At this time vital signs are stable the patient is able to speak in full sentences and is in no acute distress. She is slightly tachycardic in the low 100s. EKG is unchanged from prior.  Hyperkalemia due to missing dialysis, fluid overload or infectious etiology as patient does complain of a cough.  Chest x-ray, CBC, BMP, troponin pending  1:54 PM Chest x-ray consistent with fluid overload and BNP with a potassium of greater than 7. Nephrology contacted for dialysis. Patient given D50 and insulin as she cannot be immediately dialyzed do to another patient currently getting dialysis.  Gwyneth Sprout, MD 07/12/14 1355  Gwyneth Sprout, MD 07/12/14 1356  Gwyneth Sprout, MD 07/12/14 1521

## 2014-07-12 NOTE — ED Provider Notes (Addendum)
CSN: [id number]     Arrival date & time 07/12/14  2049 History   First MD Initiated Contact with Patient 07/12/14 2055     Chief Complaint  Patient presents with  . Leg Pain    Patient is a 63 y.o. female presenting with general illness. The history is provided by the patient.  Illness Location:  Multiple somatic complaints Severity:  Moderate Onset quality:  Unable to specify Duration:  1 day Timing:  Intermittent Progression:  Unchanged Chronicity:  New Context:  Patient got dialysis today and was ready for d/c home but refused to go home. Relieved by:  Nothing Worsened by:  Nothing Ineffective treatments:  Nothing tried Associated symptoms: no abdominal pain, no headaches, no nausea, no rash, no rhinorrhea, no shortness of breath, no sore throat and no vomiting   Risk factors:  ESRD, drug use  63 year old [place] American female with end-stage renal disease on dialysis who re-presents to the ED after receiving dialysis. The patient presented ED earlier today and was found to be hyperkalemic. Her hyperkalemia was treated and she went to dialysis. Patient did not received a full session of dialysis as the patient became intermittently hypotensive. Patient is now complaining of multiple somatic complaints including leg and arm pain. She did not want to go home by ambulance and insisted on being seen in the emergency department again. The patient has a history of cocaine abuse and she has recently been dismissed from her dialysis facility. Social worker spoke with the patient about her options for dialysis and this appears to be her last option. Patient now states that she is hungry and desires to go home if she can find a ride.  Past Medical History  Diagnosis Date  . Hypertension   . HIV infection   . Morbid obesity   . ESRD (end stage renal disease) on dialysis 09/30/2013    Started dialysis in [place], [place] around 2009.  ESRD due to HTN vs drug abuse according to pt.  Was on  dialysis at [facility] until Feb 2015 when she was admitted to a SNF due to homelessness and drug abuse.  Then changed to [facility] on TTS schedule.     . Anginal pain   . CHF (congestive heart failure)   . Shortness of breath   . Pneumonia   . Depression   . Anxiety   . GERD (gastroesophageal reflux disease)   . Arthritis   . Anemia    Past Surgical History  Procedure Laterality Date  . Arteriovenous graft placement      left forearm  . Cardiac catheterization    . Laparotomy      states seh was cut open because seh was bleeding on the inside   Family History  Problem Relation Age of Onset  . Kidney failure Other     niece  . High blood pressure    . Lung cancer    . Breast cancer Neg Hx   . Colon cancer Neg Hx   . Stroke Mother   . HIV/AIDS Brother     died of AIDS   History  Substance Use Topics  . Smoking status: Current Every Day Smoker -- 0.50 packs/day    Types: Cigarettes    Start date: 05/06/2014  . Smokeless tobacco: Never Used     Comment: recently quit  . Alcohol Use: No   OB History   Grav Para Term Preterm Abortions TAB SAB Ect Mult Living  Review of Systems  HENT: Negative for rhinorrhea and sore throat.   Eyes: Negative for visual disturbance.  Respiratory: Negative for chest tightness and shortness of breath.   Cardiovascular: Negative for palpitations and leg swelling.  Gastrointestinal: Negative for nausea, vomiting, abdominal pain and constipation.  Genitourinary: Negative for dysuria and hematuria.  Musculoskeletal: Positive for arthralgias.  Skin: Negative for rash.  Neurological: Negative for dizziness and headaches.  Psychiatric/Behavioral: Negative for confusion.  All other systems reviewed and are negative.  Allergies  Tramadol and Morphine and related  Home Medications   Prior to Admission medications   Medication Sig Start Date End Date Taking? Authorizing Provider  amLODipine (NORVASC) 5 MG tablet Take 10 mg  by mouth at bedtime.     Historical Provider, MD  calcitRIOL (ROCALTROL) 0.5 MCG capsule Take 2 mcg by mouth 3 (three) times a week. Takes on Tuesday, Thursday, and Sat with Dialysis    Historical Provider, MD  cinacalcet (SENSIPAR) 90 MG tablet Take 90 mg by mouth daily.    Historical Provider, MD  colchicine 0.6 MG tablet Take 0.6 mg by mouth daily.    Historical Provider, MD  diphenhydrAMINE (BENADRYL) 25 mg capsule Take 50 mg by mouth at bedtime as needed for sleep.    Historical Provider, MD  FLUoxetine (PROZAC) 20 MG capsule Take 20 mg by mouth daily.    Historical Provider, MD  folic acid-vitamin b complex-vitamin c-selenium-zinc (DIALYVITE) 3 MG TABS tablet Take 1 tablet by mouth daily.    Historical Provider, MD  hydrALAZINE (APRESOLINE) 25 MG tablet Take 25 mg by mouth every 8 (eight) hours.    Historical Provider, MD  HYDROmorphone (DILAUDID) 4 MG tablet Take 0.5 tablets (2 mg total) by mouth 2 (two) times daily as needed for severe pain. 07/04/14   Tomasita Crumble, MD  ipratropium-albuterol (DUONEB) 0.5-2.5 (3) MG/3ML SOLN Take 3 mLs by nebulization every 6 (six) hours as needed (shortness of breath and wheezing).    Historical Provider, MD  isosorbide mononitrate (IMDUR) 30 MG 24 hr tablet Take 30 mg by mouth daily.    Historical Provider, MD  lamivudine (EPIVIR-HBV) 5 MG/ML solution Take 25 mg by mouth daily.    Historical Provider, MD  LORazepam (ATIVAN) 0.5 MG tablet Take 0.5 mg by mouth Every Tuesday,Thursday,and Saturday with dialysis.     Historical Provider, MD  LORazepam (ATIVAN) 0.5 MG tablet Take 0.5 mg by mouth every 12 (twelve) hours as needed for anxiety.    Historical Provider, MD  omeprazole (PRILOSEC) 20 MG capsule Take 20 mg by mouth daily.    Historical Provider, MD  oxyCODONE-acetaminophen (PERCOCET/ROXICET) 5-325 MG per tablet Take 1 tablet by mouth every 8 (eight) hours as needed for severe pain.    Historical Provider, MD  polyethylene glycol (MIRALAX / GLYCOLAX) packet  Take 17 g by mouth daily as needed for mild constipation.    Historical Provider, MD  raltegravir (ISENTRESS) 400 MG tablet Take 400 mg by mouth 2 (two) times daily.    Historical Provider, MD  sevelamer carbonate (RENVELA) 800 MG tablet Take 2,400 mg by mouth 3 (three) times daily with meals.    Historical Provider, MD  tenofovir (VIREAD) 300 MG tablet Take 300 mg by mouth once a week. Give on Friday    Historical Provider, MD   BP 132/70  Pulse 88  Temp(Src) 98.7 F (37.1 C) (Oral)  Resp 18  SpO2 100% Physical Exam  Constitutional: She is oriented to person, place, and time. She appears  well-developed and well-nourished. No distress.  HENT:  Head: Normocephalic and atraumatic.  Mouth/Throat: Oropharynx is clear and moist.  Eyes: EOM are normal. Pupils are equal, round, and reactive to light.  Neck: Neck supple. No JVD present.  Cardiovascular: Normal rate, regular rhythm, normal heart sounds and intact distal pulses.  Exam reveals no gallop.   No murmur heard. Pulmonary/Chest: Effort normal and breath sounds normal. She has no wheezes. She has no rales.  Abdominal: Soft. She exhibits no distension. There is no tenderness.  Musculoskeletal: Normal range of motion. She exhibits no tenderness.  Neurological: She is alert and oriented to person, place, and time. No cranial nerve deficit. She exhibits normal muscle tone.  Skin: Skin is warm and dry. No rash noted.  Psychiatric: Her behavior is normal.   ED Course  Procedures  None   MDM   Final diagnoses:  Multiple somatic complaints   Patient is a chronically ill 63 year old [place] American female with ESRD on dialysis who presents to the ED for a second time today. Patient had dialysis earlier and was transferred back down to the emergency department by her request because she did not want to go home. Patient has recently been dismissed from her dialysis facility due to drug abuse. She states she wants to go home but is unsure how  she is going to get home. She also states she is hungry. Patient was given a sandwich and ambulance called to transport the patient home. Medical screening exam is negative for emergent condition. Feel patient is stable for transport home at this time.  Case discussed with Dr. Hyacinth MeekerMiller.     Maris BergerJonah Terrace Chiem, MD 07/13/14 [phone]  Maris BergerJonah Naida Escalante, MD 07/13/14 [phone]

## 2014-07-12 NOTE — ED Notes (Signed)
Pt st's she does not want to go home due to not being able to walk.  St's she can't walk because she does not have any shoes with her.    Pt st's she was not told that she would be transported home by [facility].  Pt requesting something to eat and drink

## 2014-07-12 NOTE — Progress Notes (Signed)
Pt with ESRD and complex PMHx and social history who dialy[name]. Last HD 10.1 - only 2.5 hours. Left 0.6 above EDW.  Pt was d/c from [facility] due to lack of payment (didn't get Oct check) so now living [place] and now doesn't have transportation to [place] for dialysis.   Presented today with CP ("always get CP with missed HD").  K 7.3 CXR pul edema. Troponin 0.2 NAD Breathing easily. Hungry and wants help with transportation.  Plan HD asap. D50 and Insulin given in ED due to delay due to another pt getting plasmapheresis.  Plan d/c post HD  Bard HerbertMarty Nyana Haren, PA-C

## 2014-07-12 NOTE — Discharge Instructions (Signed)
Dialysis Diet °A dialysis diet is a special diet when you start peritoneal dialysis or hemodialysis. Foods must be chosen carefully because different foods produce different wastes in your blood. If you are on dialysis treatment, that means your kidneys have stopped working properly on their own. This means the kidneys are removing very little or no wastes from your blood. Dialysis can perform the function of a healthy kidney and filter wastes from your body. However, between dialysis sessions, wastes build up in your blood and can make you sick. You can reduce the amount of wastes that build up in your blood by watching what you eat and drink. A good meal plan can improve your dialysis and your health. Your dietitian or renal dietitian can help you plan meals.  °FLUIDS °In between dialysis sessions, your kidneys may be able to remove some fluid or none at all. Fluid can build up and cause swelling and weight gain. The extra fluid affects your blood pressure and can make your heart work harder. Overloading your body with fluid could lead to serious heart trouble. Every person on dialysis has a different amount of fluid that they can have each day. Talk to your dietitian about how much fluid you can have each day and write that down.  °Foods that add to your fluid intake include: °· Foods that contain liquid at room temperature, such as soup, gelatin dessert, and ice cream. °· Fruits and vegetables, such as melons, grapes, apples, oranges, tomatoes, lettuce, and celery. °Your dietitian will be able to give you other tips for managing your thirst. °POTASSIUM °Potassium is a mineral found in many foods, especially milk, fruits, and vegetables. It affects how steadily your heart beats. Potassium levels can rise between dialysis sessions and can affect your heartbeat and may even cause death.  °Avoid foods high in potassium. If you do eat high-potassium foods, eat smaller portions. For example, eat half a pear instead of  a whole pear. Eat only very small portions of oranges and melons. You can remove some of the potassium from potatoes and other vegetables by peeling them and then soaking them in a large amount of water for several hours. Drain and rinse them before cooking. Talk to a dietitian about foods you can eat instead of high-potassium foods. °High-potassium foods and drinks include: °· Apricots. °· Brussels sprouts. °· Dates. °· Lima beans. °· Oranges. °· Prune juice. °· Spinach. °· Avocados. °· Milk. °· Figs. °· Melons. °· Peanuts. °· Prunes. °· Tomatoes. °· Bananas. °· Cantaloupe. °· Kiwi fruit. °· Nectarines. °· Asparagus spears. °· Raisins. °· Winter squash. °· Beets. °· Clams. °· Orange juice. °· Potatoes. °· Sardines. °· Yogurt. °PHOSPHORUS °Phosphorus is a mineral found in many foods. If you have too much phosphorus in your blood, your bones lose calcium. Losing calcium will make your bones weak and more likely to break. Also, too much phosphorus may make your skin itch. Food high in phosphorus include milk, cheese, dried beans, peas, colas, nuts, and peanut butter. Avoid eating too much of these foods. Usually, people on dialysis are limited to ½ cup of milk per day. Talk to a dietitian about foods you can eat instead of high-phosphorus foods. °PROTEIN °You may be encouraged to eat as much "high-quality protein" as you can. High-quality proteins come from meat, fish, poultry, and eggs. Choose low-fat (lean) meats that are also low in phosphorus. If you are a vegetarian, ask your dietitian about other ways to get your protein. Low-fat milk   is a good source of protein, but milk is high in phosphorus and potassium and adds to your fluid intake. Talk to a dietitian to see if milk fits into your food plan. SODIUM Sodium makes you thirsty, and if you are on dialysis, you must restrict how much fluid you drink. Therefore, try to eat fresh foods that are naturally low in sodium. Stay away from canned foods and frozen  dinners. Look for products labeled "low sodium." Do not use salt substitutes because they contain potassium. Talk to your dietitian about foods and spice blends without sodium or potassium.  CALORIES Calories come from food and provide energy for your body. Your dietitian may recommend adding or cutting down on the calories you eat depending on if you need to lose or gain weight. Talk to a dietitian about foods you can eat to either gain or lose weight. VITAMINS AND MINERALS Your caregiver may prescribe a vitamin and mineral supplement. Vitamins and minerals may be missing from your diet because you have to avoid so many foods. Talk to your dietitian or kidney doctor before choosing a supplement. Many vitamin or mineral supplements sold on the store shelf may be harmful to you. Document Released: 06/22/2004 Document Revised: 03/26/2012 Document Reviewed: 12/05/2011 [facility] Patient Information 2015 [place], [place]. This information is not intended to replace advice given to you by your health care provider. Make sure you discuss any questions you have with your health care provider.  Sodium and Fluid Restriction Some health conditions may require you to restrict your sodium and fluid intake. Sodium is part of the salt in the blood. Sodium may be restricted because when you take in a lot of salt, you become thirsty. Limiting salt with help you become less thirsty and may make it easier to restrict fluid. Talk to your caregiver or dietician about how many cups of fluid and how many milligrams of sodium you are allowed each day. If your caregiver has restricted your sodium and fluids, usually the amount you can drink depends on several things, such as:  Your urine output.  How much fluid you are retaining.  Your blood pressure. Every 2 cups (500 mL) of fluid retained in the body becomes an extra 1 pound (0.5 kg) of body weight. The following are examples of some fluids you will have to restrict:  Tea,  coffee, soda, lemonade, milk, water, and juice.  Alcoholic beverages.  Cream.  Gravy.  Ice cubes.  Soup and broth. The following are foods that become liquid at room temperature. These foods will count towards your fluid intake.  Ice cream and ice milk.  Frozen yogurt and sherbet.  Frozen ice pops.  Flavored gelatin. YOU MAY BE TAKING IN TOO MUCH FLUID IF:  Your weight increases.  Your face, hands, legs, feet, and abdomen start to swell.  You have trouble breathing. HOME CARE INSTRUCTIONS If you follow a low sodium diet closely, you will eat approximately 1,500 mg of sodium a day.   Avoid salty foods. This increases your thirst and makes fluid control more difficult. Foods high in sodium include:  Most canned foods, including most meats.  Most processed foods, including most meats.  Cheese.  Dried pasta and rice mixes.  Snack foods (chips, popcorn, pretzels, cheese puffs, salted nuts).  Dips, sauces, and salad dressings.  Do not use salt in cooking or add salt to your meal. Adriana Simas with herbs and spices, but not those that have salt in the name. Ask your caregiver if  it is okay to use salt substitutes.  Eat home-prepared meals. Use fresh ingredients. Avoid canned, frozen, or packaged meals.  Read food labels to see how much sodium is in the food. Know how much sodium you are allowed each day.  When eating out, ask for dressings and sauces on the side.  Weigh yourself every morning with an empty bladder before you eat or drink. If your weight is going up, you are retaining fluid.  Freeze fruit juice or water in an ice cube tray. Use this as part of your fluid allowance.  Brush your teeth often or rinse your mouth with mouthwash to help your dry mouth. Lemon wedges, hard sour candies, chewing gum, or breath spray may help to moisten your mouth too.  Add a slice of fresh lemon or lemon juice to water or ice. This helps satisfy your thirst.  Try frozen fruits  between meals, such as grapes or strawberries.  Swallow your pills along with meals or soft foods. This helps you save your fluid for something you enjoy.  Use small cups and glasses and learn to sip fluids slowly.  Keep your home cooler. Keep the air in your home as humid as possible. Dry air increases thirst.  Avoid being out in the hot sun. Each morning, fill a jug with the amount of water you are allowed for the day. You can use this water as a guideline for fluid allowance. Each time you take in fluid, pour an equal amount of water out of the container. This helps you to see how much fluid you are taking in. It also helps plan your fluid intake for the rest of the day. CONVERSIONS TO HELP MEASURE FLUID INTAKE  1 cup equals 8 oz (240 mL).   cup equals 6 oz (180 mL).   cup equals 5  oz (160 mL).   cup equals 4 oz (120 mL).   cup equals 2  oz (80 mL).   cup equals 2 oz (60 mL).  2 tbs equals 1 oz (30 mL). Document Released: 07/23/2007 Document Revised: 03/26/2012 Document Reviewed: 03/08/2012 [facility] Patient Information 2015 [place]. This information is not intended to replace advice given to you by your health care provider. Make sure you discuss any questions you have with your health care provider.

## 2014-07-12 NOTE — ED Notes (Signed)
Pt reports chest pain beginning last night.  Also reports missing dialysis Saturday, last dialysized Thursday.  Pt reports normal schedule Tues, Thur, Sat.  Pt also reports general body pains and a pain in her left foot.  Dorsal aspect of left foot red and warm upon palpation.

## 2014-07-12 NOTE — ED Notes (Signed)
Dr. Anitra LauthPlunkett notified of patients Potassium. No new orders.

## 2014-07-12 NOTE — ED Notes (Addendum)
Pt seen in ED earlier, sent to HD, discharged from HD and was to go home. Pt wanting to check back in to ED. Mentions "not feeling well after HD with multiple complaints: R arm, leg and chest pain". NSL left in place from recent ED visit prior to HD. Dialysis RN was not able to pull off full amount of fluid d/t low BP.   Recent background: pt left AMA from HD on Thursday. Did not go to scheduled HD on Saturday. Sent to HD tonight from ED. Pt was not to be admitted and was to be discharged. Pt recently discharged (kicked out) from living facility, now living [place]. [facility] transport home was recently cancelled d/t pt not wanting to leave and wanting to check back in to ED.

## 2014-07-13 ENCOUNTER — Telehealth: Payer: Self-pay | Admitting: *Deleted

## 2014-07-13 NOTE — ED Provider Notes (Signed)
PT with multiple complaints, seen earlier in the day and found to be stable to go to dialysis - she presents b/c she has ongoing leg pain as well as other pains, she has no focal findings on exam to suggest acute MI, DVT, or other problems.  She has normal lungs sounds, heart sounds and no peripheral edema. She is requesting d/c home - she refuses any other work up and at this time, no specific w/u would be warranted.  I saw and evaluated the patient, reviewed the resident's note and I agree with the findings and plan.   Final diagnoses:  Multiple somatic complaints      Vida RollerBrian D Corneilus Heggie, MD 07/13/14 [phone]

## 2014-07-13 NOTE — Telephone Encounter (Signed)
Patient was discharged from SNF without prescriptions.  Please advise 1) what her regimen should be (was to follow up last month) and 2) if you are prescribing all of her medications.  Patient is completely out of all medications. Please advise. Andree CossHowell, Semaya Vida M, RN

## 2014-07-13 NOTE — Telephone Encounter (Signed)
Received a call from [name]'s daughter.  [name] is in need of her medications.  She left the nursing facility she was staying at on Thursday.  All her medications were sent back to [facility] in [place], [place].  I called the nursing facility [phone]) and was told this is the normal procedure.  I was also told [name] needs to contact the pharmacy she was using before going to the nursing facility and get her scripts there.  I called and left a voicemail concerning this on [name]'s phone.

## 2014-07-14 ENCOUNTER — Other Ambulatory Visit: Payer: Self-pay | Admitting: *Deleted

## 2014-07-14 MED ORDER — LAMIVUDINE 5 MG/ML PO SOLN: 25.0000 mg | Freq: Every day | ORAL | Status: DC

## 2014-07-14 MED ORDER — RALTEGRAVIR POTASSIUM 400 MG PO TABS: 400.0000 mg | ORAL_TABLET | Freq: Two times a day (BID) | ORAL | Status: DC

## 2014-07-14 MED ORDER — TENOFOVIR DISOPROXIL FUMARATE 300 MG PO TABS: 300.0000 mg | ORAL_TABLET | ORAL | Status: DC

## 2014-07-14 NOTE — Telephone Encounter (Signed)
She should continue lamivudine 10mg /ml, 25 mg daily, isentress 400 mg po twice a day and tenoforvir 1 tab weekly.  Her labs 9/21 show it is good.  I do not prescribe her other meds and she should talk to her nephrologist at dialysis which I think would be today.

## 2014-07-14 NOTE — Telephone Encounter (Signed)
Sent to pharmacy.  Lamivudine came in 5mg /ml concentration, wrote rx for 535ml/day = 25mg  daily.

## 2014-07-18 ENCOUNTER — Emergency Department (HOSPITAL_COMMUNITY): Payer: Medicare Other

## 2014-07-18 ENCOUNTER — Inpatient Hospital Stay (HOSPITAL_COMMUNITY)
Admission: EM | Admit: 2014-07-18 | Discharge: 2014-07-21 | DRG: 640 | Disposition: A | Discharge status: Home Health | Payer: Medicare Other | Attending: Family Medicine | Admitting: Family Medicine

## 2014-07-18 ENCOUNTER — Encounter (HOSPITAL_COMMUNITY): Payer: Self-pay | Admitting: Emergency Medicine

## 2014-07-18 DIAGNOSIS — I120 Hypertensive chronic kidney disease with stage 5 chronic kidney disease or end stage renal disease: Secondary | ICD-10-CM | POA: Diagnosis present

## 2014-07-18 DIAGNOSIS — F1410 Cocaine abuse, uncomplicated: Secondary | ICD-10-CM | POA: Diagnosis present

## 2014-07-18 DIAGNOSIS — D649 Anemia, unspecified: Secondary | ICD-10-CM | POA: Diagnosis present

## 2014-07-18 DIAGNOSIS — M1611 Unilateral primary osteoarthritis, right hip: Secondary | ICD-10-CM | POA: Diagnosis present

## 2014-07-18 DIAGNOSIS — D631 Anemia in chronic kidney disease: Secondary | ICD-10-CM

## 2014-07-18 DIAGNOSIS — D72829 Elevated white blood cell count, unspecified: Secondary | ICD-10-CM | POA: Diagnosis present

## 2014-07-18 DIAGNOSIS — R0600 Dyspnea, unspecified: Secondary | ICD-10-CM

## 2014-07-18 DIAGNOSIS — E875 Hyperkalemia: Secondary | ICD-10-CM | POA: Diagnosis not present

## 2014-07-18 DIAGNOSIS — N186 End stage renal disease: Secondary | ICD-10-CM | POA: Diagnosis present

## 2014-07-18 DIAGNOSIS — G8929 Other chronic pain: Secondary | ICD-10-CM | POA: Diagnosis present

## 2014-07-18 DIAGNOSIS — Z79899 Other long term (current) drug therapy: Secondary | ICD-10-CM

## 2014-07-18 DIAGNOSIS — Z6841 Body Mass Index (BMI) 40.0 and over, adult: Secondary | ICD-10-CM

## 2014-07-18 DIAGNOSIS — E119 Type 2 diabetes mellitus without complications: Secondary | ICD-10-CM | POA: Diagnosis present

## 2014-07-18 DIAGNOSIS — N189 Chronic kidney disease, unspecified: Secondary | ICD-10-CM

## 2014-07-18 DIAGNOSIS — F329 Major depressive disorder, single episode, unspecified: Secondary | ICD-10-CM | POA: Diagnosis present

## 2014-07-18 DIAGNOSIS — B20 Human immunodeficiency virus [HIV] disease: Secondary | ICD-10-CM | POA: Diagnosis present

## 2014-07-18 DIAGNOSIS — F17210 Nicotine dependence, cigarettes, uncomplicated: Secondary | ICD-10-CM | POA: Diagnosis present

## 2014-07-18 DIAGNOSIS — F411 Generalized anxiety disorder: Secondary | ICD-10-CM | POA: Diagnosis present

## 2014-07-18 DIAGNOSIS — I10 Essential (primary) hypertension: Secondary | ICD-10-CM | POA: Diagnosis present

## 2014-07-18 DIAGNOSIS — M25559 Pain in unspecified hip: Secondary | ICD-10-CM | POA: Diagnosis present

## 2014-07-18 DIAGNOSIS — Z21 Asymptomatic human immunodeficiency virus [HIV] infection status: Secondary | ICD-10-CM | POA: Diagnosis present

## 2014-07-18 DIAGNOSIS — Z590 Homelessness: Secondary | ICD-10-CM

## 2014-07-18 DIAGNOSIS — Z992 Dependence on renal dialysis: Secondary | ICD-10-CM

## 2014-07-18 DIAGNOSIS — K219 Gastro-esophageal reflux disease without esophagitis: Secondary | ICD-10-CM | POA: Diagnosis present

## 2014-07-18 DIAGNOSIS — M879 Osteonecrosis, unspecified: Secondary | ICD-10-CM | POA: Diagnosis present

## 2014-07-18 DIAGNOSIS — I509 Heart failure, unspecified: Secondary | ICD-10-CM | POA: Diagnosis present

## 2014-07-18 DIAGNOSIS — I472 Ventricular tachycardia: Secondary | ICD-10-CM | POA: Diagnosis not present

## 2014-07-18 DIAGNOSIS — F1910 Other psychoactive substance abuse, uncomplicated: Secondary | ICD-10-CM | POA: Diagnosis present

## 2014-07-18 DIAGNOSIS — Z9119 Patient's noncompliance with other medical treatment and regimen: Secondary | ICD-10-CM | POA: Diagnosis present

## 2014-07-18 DIAGNOSIS — N2581 Secondary hyperparathyroidism of renal origin: Secondary | ICD-10-CM | POA: Diagnosis present

## 2014-07-18 LAB — CBC WITH DIFFERENTIAL/PLATELET
Basophils Absolute: 0 10*3/uL (ref 0.0–0.1)
Basophils Relative: 0 % (ref 0–1)
EOS ABS: 0.1 10*3/uL (ref 0.0–0.7)
EOS PCT: 1 % (ref 0–5)
HCT: 38.1 % (ref 36.0–46.0)
HEMOGLOBIN: 12.1 g/dL (ref 12.0–15.0)
Lymphocytes Relative: 15 % (ref 12–46)
Lymphs Abs: 1.9 10*3/uL (ref 0.7–4.0)
MCH: 33.3 pg (ref 26.0–34.0)
MCHC: 31.8 g/dL (ref 30.0–36.0)
MCV: 105 fL — ABNORMAL HIGH (ref 78.0–100.0)
MONOS PCT: 7 % (ref 3–12)
Monocytes Absolute: 0.8 10*3/uL (ref 0.1–1.0)
Neutro Abs: 9.5 10*3/uL — ABNORMAL HIGH (ref 1.7–7.7)
Neutrophils Relative %: 77 % (ref 43–77)
Platelets: 256 10*3/uL (ref 150–400)
RBC: 3.63 MIL/uL — ABNORMAL LOW (ref 3.87–5.11)
RDW: 14.1 % (ref 11.5–15.5)
WBC: 12.3 10*3/uL — ABNORMAL HIGH (ref 4.0–10.5)

## 2014-07-18 LAB — BASIC METABOLIC PANEL
Anion gap: 18 — ABNORMAL HIGH (ref 5–15)
BUN: 76 mg/dL — AB (ref 6–23)
CALCIUM: 8.5 mg/dL (ref 8.4–10.5)
CO2: 24 mEq/L (ref 19–32)
CREATININE: 11.02 mg/dL — AB (ref 0.50–1.10)
Chloride: 93 mEq/L — ABNORMAL LOW (ref 96–112)
GFR calc non Af Amer: 3 mL/min — ABNORMAL LOW (ref >90)
GFR, EST AFRICAN AMERICAN: 4 mL/min — AB (ref >90)
Glucose, Bld: 96 mg/dL (ref 70–99)
Potassium: >7.7 mEq/L (ref 3.7–5.3)
Sodium: 135 mEq/L — ABNORMAL LOW (ref 137–147)

## 2014-07-18 LAB — TROPONIN I: Troponin I: <0.3 ng/mL (ref <0.30)

## 2014-07-18 MED ORDER — ENOXAPARIN SODIUM 30 MG/0.3ML ~~LOC~~ SOLN
30.0000 mg | SUBCUTANEOUS | Status: DC
  Administered 2014-07-18: 30 mg via SUBCUTANEOUS
  Filled 2014-07-18 (×2): qty 0.3

## 2014-07-18 MED ORDER — LAMIVUDINE 10 MG/ML PO SOLN
25.0000 mg | Freq: Every day | ORAL | Status: DC
Start: 2014-07-18 — End: 2014-07-21
  Administered 2014-07-19 – 2014-07-20 (×2): 25 mg via ORAL
  Filled 2014-07-18 (×4): qty 5

## 2014-07-18 MED ORDER — SODIUM CHLORIDE 0.9 % IV SOLN: 100.0000 mL | INTRAVENOUS | Status: DC | PRN

## 2014-07-18 MED ORDER — ALBUTEROL SULFATE (2.5 MG/3ML) 0.083% IN NEBU
10.0000 mg | INHALATION_SOLUTION | Freq: Once | RESPIRATORY_TRACT | Status: AC
  Administered 2014-07-18: 10 mg via RESPIRATORY_TRACT
  Filled 2014-07-18: qty 12

## 2014-07-18 MED ORDER — LIDOCAINE HCL (PF) 1 % IJ SOLN: 5.0000 mL | INTRAMUSCULAR | Status: DC | PRN

## 2014-07-18 MED ORDER — TENOFOVIR DISOPROXIL FUMARATE 300 MG PO TABS: 300.0000 mg | ORAL_TABLET | ORAL | Status: DC

## 2014-07-18 MED ORDER — HEPARIN SODIUM (PORCINE) 1000 UNIT/ML DIALYSIS
1000.0000 [IU] | INTRAMUSCULAR | Status: DC | PRN
  Filled 2014-07-18: qty 1

## 2014-07-18 MED ORDER — SEVELAMER CARBONATE 800 MG PO TABS
2400.0000 mg | ORAL_TABLET | Freq: Three times a day (TID) | ORAL | Status: DC
  Administered 2014-07-19 – 2014-07-21 (×6): 2400 mg via ORAL
  Filled 2014-07-18 (×11): qty 3

## 2014-07-18 MED ORDER — CINACALCET HCL 30 MG PO TABS
90.0000 mg | ORAL_TABLET | Freq: Every day | ORAL | Status: DC
  Administered 2014-07-19 – 2014-07-20 (×2): 90 mg via ORAL
  Filled 2014-07-18 (×5): qty 3

## 2014-07-18 MED ORDER — PENTAFLUOROPROP-TETRAFLUOROETH EX AERO: 1.0000 "application " | INHALATION_SPRAY | CUTANEOUS | Status: DC | PRN

## 2014-07-18 MED ORDER — OXYCODONE-ACETAMINOPHEN 5-325 MG PO TABS
1.0000 | ORAL_TABLET | Freq: Three times a day (TID) | ORAL | Status: DC | PRN
  Administered 2014-07-19 – 2014-07-21 (×5): 1 via ORAL
  Filled 2014-07-18 (×5): qty 1

## 2014-07-18 MED ORDER — SODIUM CHLORIDE 0.9 % IJ SOLN
3.0000 mL | Freq: Two times a day (BID) | INTRAMUSCULAR | Status: DC
  Administered 2014-07-18: 3 mL via INTRAVENOUS

## 2014-07-18 MED ORDER — POLYETHYLENE GLYCOL 3350 17 G PO PACK
17.0000 g | PACK | Freq: Every day | ORAL | Status: DC | PRN
  Administered 2014-07-20: 17 g via ORAL
  Filled 2014-07-18 (×2): qty 1

## 2014-07-18 MED ORDER — INSULIN ASPART 100 UNIT/ML IV SOLN
10.0000 [IU] | Freq: Once | INTRAVENOUS | Status: DC
  Filled 2014-07-18: qty 1

## 2014-07-18 MED ORDER — RALTEGRAVIR POTASSIUM 400 MG PO TABS
400.0000 mg | ORAL_TABLET | Freq: Two times a day (BID) | ORAL | Status: DC
  Administered 2014-07-19 – 2014-07-21 (×5): 400 mg via ORAL
  Filled 2014-07-18 (×8): qty 1

## 2014-07-18 MED ORDER — ISOSORBIDE MONONITRATE ER 30 MG PO TB24
30.0000 mg | ORAL_TABLET | Freq: Every day | ORAL | Status: DC
  Administered 2014-07-18 – 2014-07-20 (×3): 30 mg via ORAL
  Filled 2014-07-18 (×4): qty 1

## 2014-07-18 MED ORDER — PANTOPRAZOLE SODIUM 40 MG PO TBEC
40.0000 mg | DELAYED_RELEASE_TABLET | Freq: Every day | ORAL | Status: DC
  Administered 2014-07-19 – 2014-07-20 (×2): 40 mg via ORAL
  Filled 2014-07-18 (×2): qty 1

## 2014-07-18 MED ORDER — HEPARIN SODIUM (PORCINE) 1000 UNIT/ML DIALYSIS
2000.0000 [IU] | INTRAMUSCULAR | Status: DC | PRN
  Filled 2014-07-18: qty 2

## 2014-07-18 MED ORDER — NEPRO/CARBSTEADY PO LIQD: 237.0000 mL | ORAL | Status: DC | PRN

## 2014-07-18 MED ORDER — FLUOXETINE HCL 20 MG PO CAPS
20.0000 mg | ORAL_CAPSULE | Freq: Every day | ORAL | Status: DC
  Administered 2014-07-18 – 2014-07-20 (×3): 20 mg via ORAL
  Filled 2014-07-18 (×4): qty 1

## 2014-07-18 MED ORDER — CALCITRIOL 0.5 MCG PO CAPS
2.0000 ug | ORAL_CAPSULE | ORAL | Status: DC
  Filled 2014-07-18 (×2): qty 4

## 2014-07-18 MED ORDER — LIDOCAINE-PRILOCAINE 2.5-2.5 % EX CREA: 1.0000 "application " | TOPICAL_CREAM | CUTANEOUS | Status: DC | PRN

## 2014-07-18 MED ORDER — HYDRALAZINE HCL 25 MG PO TABS
25.0000 mg | ORAL_TABLET | Freq: Three times a day (TID) | ORAL | Status: DC
  Administered 2014-07-18 – 2014-07-20 (×6): 25 mg via ORAL
  Filled 2014-07-18 (×13): qty 1

## 2014-07-18 MED ORDER — DEXTROSE 50 % IV SOLN
1.0000 | Freq: Once | INTRAVENOUS | Status: DC
  Filled 2014-07-18: qty 50

## 2014-07-18 MED ORDER — LORAZEPAM 0.5 MG PO TABS
0.5000 mg | ORAL_TABLET | Freq: Two times a day (BID) | ORAL | Status: DC | PRN
  Administered 2014-07-19 – 2014-07-20 (×3): 0.5 mg via ORAL
  Filled 2014-07-18 (×3): qty 1

## 2014-07-18 MED ORDER — ALTEPLASE 2 MG IJ SOLR
2.0000 mg | Freq: Once | INTRAMUSCULAR | Status: AC | PRN
  Filled 2014-07-18: qty 2

## 2014-07-18 MED ORDER — SODIUM CHLORIDE 0.9 % IV SOLN
1.0000 g | Freq: Once | INTRAVENOUS | Status: AC
  Administered 2014-07-18: 1 g via INTRAVENOUS
  Filled 2014-07-18: qty 10

## 2014-07-18 MED ORDER — LAMIVUDINE 5 MG/ML PO SOLN: 25.0000 mg | Freq: Every day | ORAL | Status: DC

## 2014-07-18 MED ORDER — AMLODIPINE BESYLATE 10 MG PO TABS
10.0000 mg | ORAL_TABLET | Freq: Every day | ORAL | Status: DC
  Administered 2014-07-19 – 2014-07-20 (×2): 10 mg via ORAL
  Filled 2014-07-18 (×3): qty 1

## 2014-07-18 NOTE — Procedures (Signed)
I was present at this dialysis session, have reviewed the session itself and made  appropriate changes  Vinson Moselleob Jaylean Buenaventura MD (pgr) [phone]    [phone]) [phone] 07/18/2014, 8:15 PM

## 2014-07-18 NOTE — H&P (Signed)
Family Medicine Teaching [facility] Admission History and Physical Service Pager: [phone]  Patient name: [name] Medical record number: [id number] Date of birth: Feb 09, 1951 Age: 63 y.o. Gender: female  Primary Care Provider: No PCP Per Patient Consultants: Nephrology Code Status: full  Chief Complaint: Chest and hip pain  Assessment and Plan: Cyril Railey is a 63 y.o. female presenting with chest and hip pain and hyperkalemia. PMH is significant for ESRD, HIV infection, morbid obesity, CHF, Depression/Anxiety, GERD, Anemia, Arthritis.  # Chest Pain: will work up to rule out. EKG obtained in ED showed peaked T waves w/o evidence of arrhythmia. Pain is sharp in nature, not complaining of pressure. No complaints of shortness of breath. Troponin in the ED was negative. Patient with significant history of GERD. Tenderness to palpation of thoracic wall--likely musculoskeletal etiology. Must consider cardiac cause (unlikely with atypical reproducible chest pain in setting of negative troponin), PNA (unlikely with no fever and negative CXR), and pneumothorax (unlikely with negative CXR and good air movement bilaterally). - admit to floor for observation. - Repeat EKG in a.m. - Cycle troponins. - Lipid panel performed on 06/29/2014. LDL 68, HDL 33, triglycerides 251, cholesterol 151. - No A1c on file; will order  # Hip Pain: Significant pain with any loading force right hip. X-rays appeared stable since 07/04/14, in that read of the XR notes it is stable since 07/31/13. Differential: Osteoarthritis versus septic arthritis (with or without necrosis) versus bursitis versus AVN. Likely related to osteoarthritis given chronicity of lesion. Doubt infectious etiology given chronicity of lesion. - Continue home oxycodone for pain management, hold home dilaudid at this time - Consider consult to orthopedics in a.m. - would likely benefit from hip replacement in the future - will  consult PT to evaluate patient, suspect she would benefit from SNF as she was previously at a SNF  # Hyperkalemia: ESRD, on hemodialysis (T/Th/Sa); K+ greater than 7.7; likely due to inadequate hemodialysis this week. - Patient received nebulized bronchodilators, calcium while in the ED to bring down this value. - Will receive hemodialysis as inpatient immediately. - Followup morning bmet  # HIV infection:  - Continue home medications as prescribed. Tenofovir, Raltegravir, Lamivudine   # CHF: Last echocardiogram 04/16/2014. Left ventricle showed increased wall thickness suggestive of moderate LVH. Focal basal hypertrophy. Moderately reduced systolic function. EF 35-40% akinesis of mid anterior septal myocardium. Abnormal left ventricular relaxation (grade 1 diastolic dysfunction). Left atrium moderately dilated. Right atrium mildly dilated. - Continue home medications as prescribed. - Hemodialysis tonight - Repeat echocardiogram unnecessary at this time.  # HTN:  - Continue home medications as prescribed. Norvasc, hydralazine, Imdur  # Anxiety/Depression - Continue home medications as scheduled. Prozac, Ativan  # Hyperparathyroidism secondary to end-stage renal disease - Continue on home medications as scheduled. Calcitriol, Sensipar, Renvela  # Leukocytosis:  WBC to 12.3. Previously normal. Patient with no signs of infection on exam and no signs of infection on CXR.  - will continue to monitor  FEN/GI: Renal diet with 1200 mL fluid restriction; MiraLAX Prophylaxis: Lovenox  Disposition: Will be admitted to inpatient service under observation.  History of Present Illness: [name] is a 63 y.o. female presenting with chest and hip pain. Patient presents today with chest and hip pain. Patient states that her chest pain is not uncommon for her. It is localized under her left breast. It is sharp in nature. 8/10. This pain is exacerbated with tactile pressure. Pain does not  radiate. No pain  is appreciated in her neck or left arm. She has not experienced any shortness of breath with this pain. She has not have any exacerbation of pain with movement.  Patient is also complaining of right hip pain. She states pain is extreme, sharp, greater than 10/10. It is waxing and waning in nature. It does not radiate. Pain has been going on for approximately one year. It began in her knee and progressed to her hip. Currently she states that pain is exacerbated with any loading of that joint and with movement of her hip. Strength appears to be preserved bilaterally. No inciting event was reported by patient. She reports that she has attempted to get around with a walker, though has not been able to do so today relating to pain. She also notes she recently left [place] SNF [place].   In the ED patient was noted to have significant hyperkalemia potassium greater than 7.7. Patient undergoes hemodialysis every Tuesday/Thursday/Saturday. According to patient she had been cutting her hemodialysis appointments short by half hour each episode due to increased pain in her hip. EKG showed peaked T waves with no arrhythmia. Initial troponins were negative. Chest x-ray showed slight atelectasis on the left side. No other acute pathology was noted. X-ray of the right hip was also obtained, which showed stable advanced degenerative disease of the right hip consistent with osteonecrosis of the femoral head. No recent history of steroid use.   Review Of Systems: Per HPI Otherwise 12 point review of systems was performed and was unremarkable.  Patient Active Problem List   Diagnosis Date Noted  . Hyperkalemia 07/12/2014  . Screening examination for venereal disease 05/13/2014  . Encounter for long-term (current) use of other medications 05/13/2014  . Respiratory failure 05/08/2014  . Dyspnea 04/21/2014  . Pulmonary edema 04/21/2014  . Crack cocaine use 01/17/2014   . Osteoarthritis of right hip 12/17/2013  . Extreme obesity 10/23/2013  . ESRD (end stage renal disease) on dialysis 09/30/2013  . Anemia secondary to renal failure 02/04/2013  . Compulsive tobacco user syndrome 01/30/2013  . Hyperparathyroidism due to renal insufficiency 01/30/2013  . Anxiety state 01/30/2013  . HIV infection   . Hypertension   . Mixed, or nondependent drug abuse, in remission 08/02/2010   Past Medical History: Past Medical History  Diagnosis Date  . Hypertension   . HIV infection   . Morbid obesity   . ESRD (end stage renal disease) on dialysis 09/30/2013    [place] dialysis in [place] around 2009.  ESRD due to HTN vs drug abuse according to pt.  [facility] until Feb 2015 when she was admitted to a SNF due to homelessness and drug abuse.  Then [facility] TTS schedule.     . Anginal pain   . CHF (congestive heart failure)   . Shortness of breath   . Pneumonia   . Depression   . Anxiety   . GERD (gastroesophageal reflux disease)   . Arthritis   . Anemia    Past Surgical History: Past Surgical History  Procedure Laterality Date  . Arteriovenous graft placement      left forearm  . Cardiac catheterization    . Laparotomy      states seh was cut open because seh was bleeding on the inside   Social History: History  Substance Use Topics  . Smoking status: Current Every Day Smoker -- 0.50  packs/day    Types: Cigarettes    Start date: 05/06/2014  . Smokeless tobacco: Never Used     Comment: recently quit  . Alcohol Use: No   Additional social history: Chronic smoker  Please also refer to relevant sections of EMR.  Family History: Family History  Problem Relation Age of Onset  . Kidney failure Other     niece  . High blood pressure    . Lung cancer    . Breast cancer Neg Hx   . Colon cancer Neg Hx   . Stroke Mother   . HIV/AIDS Brother     died of AIDS   Allergies and Medications: Allergies  Allergen  Reactions  . Tramadol Other (See Comments)    "Gives me bad dreams"  . Morphine And Related Hives and Rash   No current facility-administered medications on file prior to encounter.   Current Outpatient Prescriptions on File Prior to Encounter  Medication Sig Dispense Refill  . amLODipine (NORVASC) 5 MG tablet Take 10 mg by mouth at bedtime.       . calcitRIOL (ROCALTROL) 0.5 MCG capsule Take 2 mcg by mouth daily. Takes on Tuesday, Thursday, and Sat with Dialysis      . cinacalcet (SENSIPAR) 90 MG tablet Take 90 mg by mouth daily.      . colchicine 0.6 MG tablet Take 0.6 mg by mouth daily.      . diphenhydrAMINE (BENADRYL) 25 mg capsule Take 50 mg by mouth at bedtime as needed for sleep.      Marland Kitchen. FLUoxetine (PROZAC) 20 MG capsule Take 20 mg by mouth daily.      . folic acid-vitamin b complex-vitamin c-selenium-zinc (DIALYVITE) 3 MG TABS tablet Take 1 tablet by mouth daily.      . hydrALAZINE (APRESOLINE) 25 MG tablet Take 25 mg by mouth every 8 (eight) hours.      Marland Kitchen. HYDROmorphone (DILAUDID) 4 MG tablet Take 0.5 tablets (2 mg total) by mouth 2 (two) times daily as needed for severe pain.  10 tablet  0  . isosorbide mononitrate (IMDUR) 30 MG 24 hr tablet Take 30 mg by mouth daily.      Marland Kitchen. lamivudine (EPIVIR-HBV) 5 MG/ML solution Take 5 mLs (25 mg total) by mouth daily.  240 mL  5  . LORazepam (ATIVAN) 0.5 MG tablet Take 0.5 mg by mouth every 12 (twelve) hours as needed for anxiety.      Marland Kitchen. omeprazole (PRILOSEC) 20 MG capsule Take 20 mg by mouth daily.      Marland Kitchen. oxyCODONE-acetaminophen (PERCOCET/ROXICET) 5-325 MG per tablet Take 1 tablet by mouth every 8 (eight) hours as needed for severe pain.      . polyethylene glycol (MIRALAX / GLYCOLAX) packet Take 17 g by mouth daily as needed for mild constipation.      . raltegravir (ISENTRESS) 400 MG tablet Take 1 tablet (400 mg total) by mouth 2 (two) times daily.  60 tablet  5  . sevelamer carbonate (RENVELA) 800 MG tablet Take 2,400 mg by mouth 3 (three)  times daily with meals.      Marland Kitchen. tenofovir (VIREAD) 300 MG tablet Take 1 tablet (300 mg total) by mouth once a week. Give on Friday  5 tablet  5    Objective: BP 113/75  Pulse 110  Temp(Src) 97.2 F (36.2 C) (Oral)  Resp 27  Ht 5\' 4"  (1.626 m)  Wt 249 lb (112.946 kg)  BMI 42.72 kg/m2  SpO2 98% Exam: General --  oriented x3, pleasant and cooperative. HEENT -- Head is normocephalic. Eyes, pupils equal and round and react to light and accommodation. Extraocular motions are intact. MMM. Neck -- supple; no bruits. Integument -- intact. No rash or erythema Chest -- good expansion. Lungs clear to auscultation. Cardiac -- RRR. No murmurs noted. Slight TTP Rt chest wall Abdomen -- soft, nontender. No masses palpable. Normal bowel sounds present. CNS -- no focal deficits, 2+ patellar reflexes bilaterally. Musculoskeletal - Significant TTP Rt hip. TTP of bilateral knee joint lines. Strength exam of right hip flexors limited due to discomfort with movement of hip, though patient is able to lift right leg off the bed, 5/5 strength in left hip flexors and extensors, bilateral plantar and dorsiflexion, body habitus makes it difficult to appreciate any joint swelling though none is obvious on exam, no warmth of the right hip or bilateral knees   Labs and Imaging: CBC BMET   Recent Labs Lab 07/18/14 1315  WBC 12.3*  HGB 12.1  HCT 38.1  PLT 256    Recent Labs Lab 07/18/14 1315  NA 135*  K >7.7*  CL 93*  CO2 24  BUN 76*  CREATININE 11.02*  GLUCOSE 96  CALCIUM 8.5     Troponin - neg x1   XR Rt Hip 10/10 IMPRESSION:  Stable advanced degenerative disease of the right hip with findings  consistent with osteonecrosis of the femoral head.  CXR 10/10 IMPRESSION:  Slight atelectasis on the left. No edema or consolidation.  EKG peaked T waves V2-4, rate 100, sinus tach  Kathee Delton, MD 07/18/2014, 6:45 PM PGY-1, [facility] Family Medicine FPTS Intern pager: [phone], text pages  welcome  Upper Level Addendum:  I have seen and evaluated this patient along with Dr. Wende Mott and reviewed the above note, making necessary revisions in red.   Marikay Alar, MD Family Medicine PGY-3

## 2014-07-18 NOTE — ED Notes (Signed)
Attempted IV start. Unsuccessful. IV team contacted

## 2014-07-18 NOTE — Progress Notes (Signed)
Patient admitted to room 6E21 transfer from Hemodialysis direct from ED. Alert and oriented x 4. Skin warm and  Dry. Denies pain or discomfort. Refused lab draw this evening.

## 2014-07-18 NOTE — Consult Note (Signed)
Renal Service Consult Note [facility]  Zettie CooleyMargaret Ann Fana 07/18/2014 Delano MetzSCHERTZ,Thayer Inabinet D Requesting Physician:  Dr Deirdre Priesthambliss  Reason for Consult:  ESRD pt missed HD today with hyperkalemia HPI: The patient is a 63 y.o. year-old with hx of HTN, ESRD on HD, substance abuse presented to ED today with CP, and leg pain.  Missed today's HD to come to ED. K > 7.5. EKG with mild peaked T's.      [place] 2009 [place], ESRD was due to HTN and drug abuse acc to patient. In Feb 2015 she was admitted to a SNF for homelessness and drug abuse with HD needs. In July [place] [facility] [facility] on [address] on a TTS schedule as she had moved to [place] to live with family. She has been in and out of SNF and ALF facilities and been on HD in [place] and now [place].    She says today that she left her nursing facility and [place] live with her family about 2 wks ago. She has arranged transportation for HD on weekdays but says that they don't run on Saturday. Also she has been eating a lot of tomatoes and spaghetti.    Chart review: 10/01 - voluntary admit to [facility] for alcohol and cocaine abuse, SI. Hx of etoh and crack cocaine x 10 yrs, also DM, HTN and DJD.  7/03 - 2nd admit to [facility] for SI and substance abuse 8/13 - ESRD, pt presented from outside HD unit [place] wanting to change HD unit. Pt admitted and CL[facility] to [facility]. Also HIV on meds, HTN and diet controlled DM 3/14 - Hyperkalemia, ESRD, fall, passing, knee pain. Not fully compliant with HD sessions (misses or s/o early). K > 7.5, rx w HD 12/14 - SOB and pulm edema, K 6.2 > rx hemodialysis. She had missed HD for one week. Was 6kg above dry wt.  1/15 - acute pulm edema, rx with HD 1/15 - SOB and chest pain, missed HD, homeless > refused CP w/u, CP resolved. HD , HIV, cont meds 2/15 - chest pain, high K, homelessness > rx with HD, CP d/t anxiety.  DC"d to SNF.  4/15 - DC'd from SNF on feb 9th > homeless and [place]. Starting doing crack cocaine. Presented with abd pain , n/v/d. Resolved. CT abd negative. DC'd to SNF per SW.  7/15 - SOB, swelling > was getting HD [facility] but abruptly moved to [place] and couldn't get back [place] for [place]. Rx with HD. Set up for HD at [facility] in [place] TTS. No longer to get HD in [place]. DC to live with family 7/31 - 05/12/14 > pulm edema d/t missed HD > resumed TTS HD. [facility] with HD [facility] TTS.   ROS  no chest pain  no cough or SOB  no leg edema  no abd pain, n/v/d  Past Medical History  Past Medical History  Diagnosis Date  . Hypertension   . HIV infection   . Morbid obesity   . ESRD (end stage renal disease) on dialysis 09/30/2013    [place] in [place] around 2009.  ESRD due to HTN vs drug abuse according to pt.  [facility] dialysis [facility] until Feb 2015 when she was admitted to a SNF due  to homelessness and drug abuse.  Then [facility].     . Anginal pain   . CHF (congestive heart failure)   . Shortness of breath   . Pneumonia   . Depression   . Anxiety   . GERD (gastroesophageal reflux disease)   . Arthritis   . Anemia    Past Surgical History  Past Surgical History  Procedure Laterality Date  . Arteriovenous graft placement      left forearm  . Cardiac catheterization    . Laparotomy      states seh was cut open because seh was bleeding on the inside   Family History  Family History  Problem Relation Age of Onset  . Kidney failure Other     niece  . High blood pressure    . Lung cancer    . Breast cancer Neg Hx   . Colon cancer Neg Hx   . Stroke Mother   . HIV/AIDS Brother     died of AIDS   Social History  reports that she has been smoking Cigarettes.  She started smoking about 2 months ago. She has been smoking about 0.50 packs per day.  She has never used smokeless tobacco. She reports that she does not drink alcohol or use illicit drugs. Allergies  Allergies  Allergen Reactions  . Tramadol Other (See Comments)    "Gives me bad dreams"  . Morphine And Related Hives and Rash   Home medications Prior to Admission medications   Medication Sig Start Date End Date Taking? Authorizing Provider  amLODipine (NORVASC) 5 MG tablet Take 10 mg by mouth at bedtime.    Yes Historical Provider, MD  calcitRIOL (ROCALTROL) 0.5 MCG capsule Take 2 mcg by mouth daily. Takes on Tuesday, Thursday, and Sat with Dialysis   Yes Historical Provider, MD  cinacalcet (SENSIPAR) 90 MG tablet Take 90 mg by mouth daily.   Yes Historical Provider, MD  colchicine 0.6 MG tablet Take 0.6 mg by mouth daily.   Yes Historical Provider, MD  diphenhydrAMINE (BENADRYL) 25 mg capsule Take 50 mg by mouth at bedtime as needed for sleep.   Yes Historical Provider, MD  FLUoxetine (PROZAC) 20 MG capsule Take 20 mg by mouth daily.   Yes Historical Provider, MD  folic acid-vitamin b complex-vitamin c-selenium-zinc (DIALYVITE) 3 MG TABS tablet Take 1 tablet by mouth daily.   Yes Historical Provider, MD  hydrALAZINE (APRESOLINE) 25 MG tablet Take 25 mg by mouth every 8 (eight) hours.   Yes Historical Provider, MD  HYDROmorphone (DILAUDID) 4 MG tablet Take 0.5 tablets (2 mg total) by mouth 2 (two) times daily as needed for severe pain. 07/04/14  Yes Tomasita CrumbleAdeleke Oni, MD  isosorbide mononitrate (IMDUR) 30 MG 24 hr tablet Take 30 mg by mouth daily.   Yes Historical Provider, MD  lamivudine (EPIVIR-HBV) 5 MG/ML solution Take 5 mLs (25 mg total) by mouth daily. 07/14/14  Yes Gardiner Barefootobert W Comer, MD  LORazepam (ATIVAN) 0.5 MG tablet Take 0.5 mg by mouth every 12 (twelve) hours as needed for anxiety.   Yes Historical Provider, MD  omeprazole (PRILOSEC) 20 MG capsule Take 20 mg by mouth daily.   Yes Historical Provider, MD  oxyCODONE-acetaminophen (PERCOCET/ROXICET) 5-325 MG per tablet Take 1  tablet by mouth every 8 (eight) hours as needed for severe pain.   Yes Historical Provider, MD  polyethylene glycol (MIRALAX / GLYCOLAX) packet Take 17 g by mouth daily as needed for mild constipation.  Yes Historical Provider, MD  raltegravir (ISENTRESS) 400 MG tablet Take 1 tablet (400 mg total) by mouth 2 (two) times daily. 07/14/14  Yes Gardiner Barefoot, MD  sevelamer carbonate (RENVELA) 800 MG tablet Take 2,400 mg by mouth 3 (three) times daily with meals.   Yes Historical Provider, MD  tenofovir (VIREAD) 300 MG tablet Take 1 tablet (300 mg total) by mouth once a week. Give on Friday 07/14/14  Yes Gardiner Barefoot, MD   Liver Function Tests No results found for this basename: AST, ALT, ALKPHOS, BILITOT, PROT, ALBUMIN,  in the last 168 hours No results found for this basename: LIPASE, AMYLASE,  in the last 168 hours CBC  Recent Labs Lab 07/12/14 1249 07/12/14 1814 07/18/14 1315  WBC 7.7  --  12.3*  NEUTROABS  --   --  9.5*  HGB 11.8* 12.2 12.1  HCT 35.8* 36.0 38.1  MCV 106.5*  --  105.0*  PLT 276  --  256   Basic Metabolic Panel  Recent Labs Lab 07/12/14 1249 07/12/14 1651 07/12/14 1814 07/18/14 1315  NA 136* 134* 139 135*  K 7.3* 6.4* 4.1 >7.7*  CL 93* 93* 100 93*  CO2 26 23  --  24  GLUCOSE 95 59* 98 96  BUN 66* 69* 29* 76*  CREATININE 12.41* 12.46* 6.00* 11.02*  CALCIUM 9.3 9.1  --  8.5    Filed Vitals:   07/18/14 1430 07/18/14 1500 07/18/14 1646 07/18/14 1700  BP: 128/72 140/69  156/83  Pulse: 94 92 96 94  Temp:      TempSrc:      Resp: 16 17 24 22   Height:      Weight:      SpO2: 94% 98% 98% 100%   Exam Alert, adult female, no distress No rash, cyanosis or gangrene Sclera anicteric, throat clear No JVD Chest faint crackle L base, R clear RRR no MRG Abd obese, soft, NTND No LE or UE edema L arm AVF patent Neuro is alert, nf, Ox 3  HD: TTS [place] Get records in am   Assessment: 1 Hyperkalemia - severe d/t missed HD today and noncompliance with low  K diet 2 ESRD on HD  3 HIV on ART 4 Anemia Hb 12 5 Vol - no sig excess, BP's good   Plan- urgent HD tonight for severe hyperkalemia. Recheck in am. UF 3-4 kg.   Vinson Moselle MD (pgr) [phone]    [phone] 07/18/2014, 5:12 PM

## 2014-07-18 NOTE — ED Provider Notes (Signed)
I saw and evaluated the patient, reviewed the resident's note and I agree with the findings and plan.  Please see my separate note regarding my evaluation of the patient.   Vida RollerBrian D Damali Broadfoot, MD 07/18/14 [phone]

## 2014-07-18 NOTE — ED Provider Notes (Addendum)
CSN: [id number]     Arrival date & time 07/18/14  1246 History   First MD Initiated Contact with Patient 07/18/14 1255     Chief Complaint  Patient presents with  . Chest Pain     (Consider location/radiation/quality/duration/timing/severity/associated sxs/prior Treatment) HPI Comments: Presents to the ER with multiple complaints. Patient complaining of chest pain and leg pain. Patient reports a sharp pain in the left chest. Pain worsens when she touches the area or moves. She feels slightly short of breath. She is also complaining of pain in the right hip. This is apparently chronic but worse today. She denies injury.  Patient is a 63 y.o. female presenting with chest pain.  Chest Pain   Past Medical History  Diagnosis Date  . Hypertension   . HIV infection   . Morbid obesity   . ESRD (end stage renal disease) on dialysis 09/30/2013    [place] in [place] around 2009.  ESRD due to HTN vs drug abuse according to pt.  [facility] Feb 2015 when she was admitted to a SNF due to homelessness and drug abuse.  Then changed to [facility] on TTS schedule.     . Anginal pain   . CHF (congestive heart failure)   . Shortness of breath   . Pneumonia   . Depression   . Anxiety   . GERD (gastroesophageal reflux disease)   . Arthritis   . Anemia    Past Surgical History  Procedure Laterality Date  . Arteriovenous graft placement      left forearm  . Cardiac catheterization    . Laparotomy      states seh was cut open because seh was bleeding on the inside   Family History  Problem Relation Age of Onset  . Kidney failure Other     niece  . High blood pressure    . Lung cancer    . Breast cancer Neg Hx   . Colon cancer Neg Hx   . Stroke Mother   . HIV/AIDS Brother     died of AIDS   History  Substance Use Topics  . Smoking status: Current Every Day Smoker -- 0.50 packs/day    Types: Cigarettes    Start date: 05/06/2014  . Smokeless  tobacco: Never Used     Comment: recently quit  . Alcohol Use: No   OB History   Grav Para Term Preterm Abortions TAB SAB Ect Mult Living                 Review of Systems  Cardiovascular: Positive for chest pain.  Musculoskeletal: Positive for arthralgias.  All other systems reviewed and are negative.     Allergies  Tramadol and Morphine and related  Home Medications   Prior to Admission medications   Medication Sig Start Date End Date Taking? Authorizing Provider  amLODipine (NORVASC) 5 MG tablet Take 10 mg by mouth at bedtime.    Yes Historical Provider, MD  calcitRIOL (ROCALTROL) 0.5 MCG capsule Take 2 mcg by mouth daily. Takes on Tuesday, Thursday, and Sat with Dialysis   Yes Historical Provider, MD  cinacalcet (SENSIPAR) 90 MG tablet Take 90 mg by mouth daily.   Yes Historical Provider, MD  colchicine 0.6 MG tablet Take 0.6 mg by mouth daily.   Yes Historical Provider, MD  diphenhydrAMINE (BENADRYL) 25 mg capsule Take 50 mg by mouth at bedtime as needed for sleep.   Yes Historical  Provider, MD  FLUoxetine (PROZAC) 20 MG capsule Take 20 mg by mouth daily.   Yes Historical Provider, MD  folic acid-vitamin b complex-vitamin c-selenium-zinc (DIALYVITE) 3 MG TABS tablet Take 1 tablet by mouth daily.   Yes Historical Provider, MD  hydrALAZINE (APRESOLINE) 25 MG tablet Take 25 mg by mouth every 8 (eight) hours.   Yes Historical Provider, MD  HYDROmorphone (DILAUDID) 4 MG tablet Take 0.5 tablets (2 mg total) by mouth 2 (two) times daily as needed for severe pain. 07/04/14  Yes Tomasita Crumble, MD  isosorbide mononitrate (IMDUR) 30 MG 24 hr tablet Take 30 mg by mouth daily.   Yes Historical Provider, MD  lamivudine (EPIVIR-HBV) 5 MG/ML solution Take 5 mLs (25 mg total) by mouth daily. 07/14/14  Yes Gardiner Barefoot, MD  LORazepam (ATIVAN) 0.5 MG tablet Take 0.5 mg by mouth every 12 (twelve) hours as needed for anxiety.   Yes Historical Provider, MD  omeprazole (PRILOSEC) 20 MG capsule Take  20 mg by mouth daily.   Yes Historical Provider, MD  oxyCODONE-acetaminophen (PERCOCET/ROXICET) 5-325 MG per tablet Take 1 tablet by mouth every 8 (eight) hours as needed for severe pain.   Yes Historical Provider, MD  polyethylene glycol (MIRALAX / GLYCOLAX) packet Take 17 g by mouth daily as needed for mild constipation.   Yes Historical Provider, MD  raltegravir (ISENTRESS) 400 MG tablet Take 1 tablet (400 mg total) by mouth 2 (two) times daily. 07/14/14  Yes Gardiner Barefoot, MD  sevelamer carbonate (RENVELA) 800 MG tablet Take 2,400 mg by mouth 3 (three) times daily with meals.   Yes Historical Provider, MD  tenofovir (VIREAD) 300 MG tablet Take 1 tablet (300 mg total) by mouth once a week. Give on Friday 07/14/14  Yes Gardiner Barefoot, MD   BP 143/80  Temp(Src) 98.5 F (36.9 C) (Oral)  Resp 20  Ht 5\' 4"  (1.626 m)  Wt 249 lb (112.946 kg)  BMI 42.72 kg/m2  SpO2 97% Physical Exam  Constitutional: She is oriented to person, place, and time. She appears well-developed and well-nourished. No distress.  HENT:  Head: Normocephalic and atraumatic.  Right Ear: Hearing normal.  Left Ear: Hearing normal.  Nose: Nose normal.  Mouth/Throat: Oropharynx is clear and moist and mucous membranes are normal.  Eyes: Conjunctivae and EOM are normal. Pupils are equal, round, and reactive to light.  Neck: Normal range of motion. Neck supple.  Cardiovascular: Regular rhythm, S1 normal and S2 normal.  Exam reveals no gallop and no friction rub.   No murmur heard. Pulmonary/Chest: Effort normal and breath sounds normal. No respiratory distress. She exhibits tenderness.    Abdominal: Soft. Normal appearance and bowel sounds are normal. There is no hepatosplenomegaly. There is no tenderness. There is no rebound, no guarding, no tenderness at McBurney's point and negative Murphy's sign. No hernia.  Musculoskeletal: Normal range of motion.       Right hip: She exhibits tenderness.  Neurological: She is alert and  oriented to person, place, and time. She has normal strength. No cranial nerve deficit or sensory deficit. Coordination normal. GCS eye subscore is 4. GCS verbal subscore is 5. GCS motor subscore is 6.  Skin: Skin is warm, dry and intact. No rash noted. No cyanosis.  Psychiatric: She has a normal mood and affect. Her speech is normal and behavior is normal. Thought content normal.    ED Course  Procedures (including critical care time)  Angiocath insertion Performed by: Federico Maiorino J.  Consent:  Verbal consent obtained. Risks and benefits: risks, benefits and alternatives were discussed Time out: Immediately prior to procedure a "time out" was called to verify the correct patient, procedure, equipment, support staff and site/side marked as required.  Preparation: Patient was prepped and draped in the usual sterile fashion.  Vein Location: right antecub  Ultrasound Guided  Gauge: 20G  Normal blood return and flush without difficulty Patient tolerance: Patient tolerated the procedure well with no immediate complications.    Labs Review Labs Reviewed  CBC WITH DIFFERENTIAL - Abnormal; Notable for the following:    WBC 12.3 (*)    RBC 3.63 (*)    MCV 105.0 (*)    Neutro Abs 9.5 (*)    All other components within normal limits  BASIC METABOLIC PANEL - Abnormal; Notable for the following:    Sodium 135 (*)    Potassium >7.7 (*)    Chloride 93 (*)    BUN 76 (*)    Creatinine, Ser 11.02 (*)    GFR calc non Af Amer 3 (*)    GFR calc Af Amer 4 (*)    Anion gap 18 (*)    All other components within normal limits  TROPONIN I    Imaging Review Dg Chest 2 View  07/18/2014   CLINICAL DATA:  Renal failure ; hypertension  EXAM: CHEST  2 VIEW  COMPARISON:  July 12, 2014  FINDINGS: There is minimal left mid lung atelectasis. Elsewhere, the lungs are clear. The heart size and pulmonary vascularity are normal. No adenopathy. No bone lesions.  IMPRESSION: Slight atelectasis on  the left.  No edema or consolidation.   Electronically Signed   By: Bretta Bang M.D.   On: 07/18/2014 15:53   Dg Hip Complete Right  07/18/2014   CLINICAL DATA:  Right hip pain.  EXAM: RIGHT HIP - COMPLETE 2+ VIEW  COMPARISON:  07/04/2014 and multiple prior radiographs.  FINDINGS: No acute fracture or dislocation. Stable advanced degenerative changes of the right hip joint including sclerosis of the femoral head with markedly irregular articular surfaces of the femoral head and acetabulum. 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 No bony lesions are identified.  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   Electronically Signed   By: Irish Lack M.D.   On: 07/18/2014 15:56     EKG Interpretation   Date/Time:  Saturday July 18 2014 13:01:05 EDT Ventricular Rate:  100 PR Interval:  209 QRS Duration: 133 QT Interval:  365 QTC Calculation: 471 R Axis:   11 Text Interpretation:  Sinus tachycardia Borderline prolonged PR interval  lvh with repol abn No significant change since last tracing Confirmed by  Izabellah Dadisman  MD, Gwendy Boeder [phone]) on 07/18/2014 1:07:57 PM      MDM   Final diagnoses:  None   end-stage renal disease   Hyperkalemia  Chest wall pain  Chronic hip pain  Patient presents to the ER for evaluation of chest pain. She is also complaining of right hip pain. Both of these complaints are somewhat chronic for her. She does have a history of end-stage renal disease, on dialysis. Her workup reveals significant hyperkalemia. EKG shows slightly peaked T waves, no arrhythmia.  As the chest pain appears to be musculoskeletal in nature. She is expecting a sharp pain which significantly worsens with palpation over the area. No ischemia or infarct on EKG, troponin negative. X-ray of hip is also  unchanged from previous, shows degenerative changes.  Nephrology consult  for urgent dialysis secondary to hyperkalemia. Will dialyze the patient. Doctor Arlean HoppingSchertz asked for medicine admission due to significant hyperkalemia.    Gilda Creasehristopher J. Abby Tucholski, MD 07/18/14 1606  Gilda Creasehristopher J. Buffey Zabinski, MD 07/18/14 1640

## 2014-07-18 NOTE — ED Notes (Signed)
Per EMS, patient c/o chest pain and leg pain which is chronic but is worse since 0700 this AM. Pt. Seen multiple times for same. Dialysis patient (t,th,s) but missed today. Refused treatment by EMS.

## 2014-07-19 DIAGNOSIS — M1611 Unilateral primary osteoarthritis, right hip: Secondary | ICD-10-CM | POA: Diagnosis present

## 2014-07-19 DIAGNOSIS — D649 Anemia, unspecified: Secondary | ICD-10-CM | POA: Diagnosis present

## 2014-07-19 DIAGNOSIS — E875 Hyperkalemia: Secondary | ICD-10-CM | POA: Diagnosis present

## 2014-07-19 DIAGNOSIS — I509 Heart failure, unspecified: Secondary | ICD-10-CM | POA: Diagnosis present

## 2014-07-19 DIAGNOSIS — I120 Hypertensive chronic kidney disease with stage 5 chronic kidney disease or end stage renal disease: Secondary | ICD-10-CM | POA: Diagnosis present

## 2014-07-19 DIAGNOSIS — G8929 Other chronic pain: Secondary | ICD-10-CM | POA: Diagnosis present

## 2014-07-19 DIAGNOSIS — Z9119 Patient's noncompliance with other medical treatment and regimen: Secondary | ICD-10-CM | POA: Diagnosis present

## 2014-07-19 DIAGNOSIS — F1410 Cocaine abuse, uncomplicated: Secondary | ICD-10-CM | POA: Diagnosis present

## 2014-07-19 DIAGNOSIS — K219 Gastro-esophageal reflux disease without esophagitis: Secondary | ICD-10-CM | POA: Diagnosis present

## 2014-07-19 DIAGNOSIS — N2581 Secondary hyperparathyroidism of renal origin: Secondary | ICD-10-CM | POA: Diagnosis present

## 2014-07-19 DIAGNOSIS — E119 Type 2 diabetes mellitus without complications: Secondary | ICD-10-CM | POA: Diagnosis present

## 2014-07-19 DIAGNOSIS — F329 Major depressive disorder, single episode, unspecified: Secondary | ICD-10-CM | POA: Diagnosis present

## 2014-07-19 DIAGNOSIS — N186 End stage renal disease: Secondary | ICD-10-CM | POA: Diagnosis present

## 2014-07-19 DIAGNOSIS — Z590 Homelessness: Secondary | ICD-10-CM | POA: Diagnosis not present

## 2014-07-19 DIAGNOSIS — F17210 Nicotine dependence, cigarettes, uncomplicated: Secondary | ICD-10-CM | POA: Diagnosis present

## 2014-07-19 DIAGNOSIS — M879 Osteonecrosis, unspecified: Secondary | ICD-10-CM | POA: Diagnosis present

## 2014-07-19 DIAGNOSIS — D72829 Elevated white blood cell count, unspecified: Secondary | ICD-10-CM | POA: Diagnosis present

## 2014-07-19 DIAGNOSIS — Z992 Dependence on renal dialysis: Secondary | ICD-10-CM | POA: Diagnosis not present

## 2014-07-19 DIAGNOSIS — Z79899 Other long term (current) drug therapy: Secondary | ICD-10-CM | POA: Diagnosis not present

## 2014-07-19 DIAGNOSIS — F1910 Other psychoactive substance abuse, uncomplicated: Secondary | ICD-10-CM | POA: Diagnosis present

## 2014-07-19 DIAGNOSIS — F411 Generalized anxiety disorder: Secondary | ICD-10-CM | POA: Diagnosis present

## 2014-07-19 DIAGNOSIS — M25559 Pain in unspecified hip: Secondary | ICD-10-CM | POA: Diagnosis present

## 2014-07-19 DIAGNOSIS — Z6841 Body Mass Index (BMI) 40.0 and over, adult: Secondary | ICD-10-CM | POA: Diagnosis not present

## 2014-07-19 DIAGNOSIS — I472 Ventricular tachycardia: Secondary | ICD-10-CM | POA: Diagnosis not present

## 2014-07-19 DIAGNOSIS — Z21 Asymptomatic human immunodeficiency virus [HIV] infection status: Secondary | ICD-10-CM | POA: Diagnosis present

## 2014-07-19 LAB — BASIC METABOLIC PANEL
Anion gap: 14 (ref 5–15)
BUN: 36 mg/dL — ABNORMAL HIGH (ref 6–23)
CO2: 27 mEq/L (ref 19–32)
Calcium: 8.6 mg/dL (ref 8.4–10.5)
Chloride: 95 mEq/L — ABNORMAL LOW (ref 96–112)
Creatinine, Ser: 6.8 mg/dL — ABNORMAL HIGH (ref 0.50–1.10)
GFR, EST AFRICAN AMERICAN: 7 mL/min — AB (ref >90)
GFR, EST NON AFRICAN AMERICAN: 6 mL/min — AB (ref >90)
GLUCOSE: 116 mg/dL — AB (ref 70–99)
Potassium: 5.1 mEq/L (ref 3.7–5.3)
SODIUM: 136 meq/L — AB (ref 137–147)

## 2014-07-19 LAB — TROPONIN I

## 2014-07-19 LAB — MRSA PCR SCREENING: MRSA by PCR: NEGATIVE

## 2014-07-19 LAB — HEPATITIS B SURFACE ANTIGEN: HEP B S AG: NEGATIVE

## 2014-07-19 MED ORDER — ENOXAPARIN SODIUM 40 MG/0.4ML ~~LOC~~ SOLN
40.0000 mg | SUBCUTANEOUS | Status: DC
  Administered 2014-07-19: 40 mg via SUBCUTANEOUS
  Filled 2014-07-19 (×3): qty 0.4

## 2014-07-19 MED ORDER — DIPHENHYDRAMINE HCL 25 MG PO CAPS
25.0000 mg | ORAL_CAPSULE | Freq: Once | ORAL | Status: AC
  Administered 2014-07-19: 25 mg via ORAL
  Filled 2014-07-19: qty 1

## 2014-07-19 NOTE — Progress Notes (Addendum)
07/19/2014 1:58 PM  Pt would only allow nursing staff to stick for peripheral IV access if the IV nurse would be the one to attempt and while using ultrasound.  IV team has stuck patient twice utilizing ultrasound without success.  Pt at this point refusing for IV access to be placed in the [facility], and IV nurse also reports that if access is needed, the MD will have to be the one to place it.  Dr. Caleb PoppNettey notified and okay at this point to leave PIV out per him.  Will monitor. Theadora RamaKIRKMAN, Meeghan Skipper Brooke

## 2014-07-19 NOTE — Progress Notes (Signed)
PT Cancellation Note  Patient Details Name: [name] MRN: [id number] DOB: 07/01/1951   Cancelled Treatment:    Reason Eval/Treat Not Completed: Other (comment)  Pt politely declining PT due to pain;   Will follow up for PT tomorrow;  Thank you,  Van ClinesHolly Trenell Concannon, PT  Acute Rehabilitation Services Pager [phone] Office [phone]     Van ClinesGarrigan, [name] [name] 07/19/2014, 5:06 PM

## 2014-07-19 NOTE — Progress Notes (Signed)
Addendum to prior note   OP  HD = [facility] TTS 4 hrs, edw =  108.5 kg,2k, 2.25ca  LFA AVF 9800 Heparin, Venofer 50mg  weekly hd, No ESA, LAst op hgb 11.1(07-10-14) Calcitriol 2.480mcg po qhd   Noted She has little insight into her ESRD with noncompliance attending HD attended only 2 op HD tx in October last on 07/16/14 , 0/01 and  missed 10/06 and 10/03 .   Lenny Pastelavid Zeyfang, PA-C [facility] Beeper [phone] 07/19/2014,2:57 PM  LOS: 1 day   Pt seen, examined and agree w A/P as above.  Vinson Moselleob Alejandra Hunt MD pager [phone]    cell [phone] 07/27/2014, 1:42 PM

## 2014-07-19 NOTE — Progress Notes (Signed)
Pt has now agreed to have am labs drawn. RN called lab tech.

## 2014-07-19 NOTE — Progress Notes (Addendum)
ANTICOAGULATION CONSULT NOTE - Initial Consult  Pharmacy Consult for Lovenox for VTE prophylaxis (obese patient with renal impairment) Indication: VTE prophylaxis   Allergies  Allergen Reactions  . Tramadol Other (See Comments)    "Gives me bad dreams"  . Morphine And Related Hives and Rash    Patient Measurements: Height: 5\' 4"  (162.6 cm) Weight: 248 lb 6.4 oz (112.674 kg) IBW/kg (Calculated) : 54.7   Vital Signs: Temp: 98.6 F (37 C) (10/11 1035) Temp Source: Oral (10/11 1035) BP: 115/63 mmHg (10/11 1035) Pulse Rate: 105 (10/11 1035)  Labs:  Recent Labs  07/18/14 1315  HGB 12.1  HCT 38.1  PLT 256  CREATININE 11.02*  TROPONINI <0.30    Estimated Creatinine Clearance: 6.6 ml/min (by C-G formula based on Cr of 11.02).   Medical History: Past Medical History  Diagnosis Date  . Hypertension   . HIV infection   . Morbid obesity   . ESRD (end stage renal disease) on dialysis 09/30/2013    [place].  ESRD due to HTN vs drug abuse according to pt.  [facility] until Feb 2015 when she was admitted to a SNF due to homelessness and drug abuse.  Then changed to [facility] on TTS schedule.     . Anginal pain   . CHF (congestive heart failure)   . Shortness of breath   . Pneumonia   . Depression   . Anxiety   . GERD (gastroesophageal reflux disease)   . Arthritis   . Anemia     Medications:  Prescriptions prior to admission  Medication Sig Dispense Refill  . amLODipine (NORVASC) 5 MG tablet Take 10 mg by mouth at bedtime.       . calcitRIOL (ROCALTROL) 0.5 MCG capsule Take 2 mcg by mouth daily. Takes on Tuesday, Thursday, and Sat with Dialysis      . cinacalcet (SENSIPAR) 90 MG tablet Take 90 mg by mouth daily.      . colchicine 0.6 MG tablet Take 0.6 mg by mouth daily.      . diphenhydrAMINE (BENADRYL) 25 mg capsule Take 50 mg by mouth at bedtime as needed for sleep.      Marland Kitchen. FLUoxetine (PROZAC) 20 MG capsule  Take 20 mg by mouth daily.      . folic acid-vitamin b complex-vitamin c-selenium-zinc (DIALYVITE) 3 MG TABS tablet Take 1 tablet by mouth daily.      . hydrALAZINE (APRESOLINE) 25 MG tablet Take 25 mg by mouth every 8 (eight) hours.      Marland Kitchen. HYDROmorphone (DILAUDID) 4 MG tablet Take 0.5 tablets (2 mg total) by mouth 2 (two) times daily as needed for severe pain.  10 tablet  0  . isosorbide mononitrate (IMDUR) 30 MG 24 hr tablet Take 30 mg by mouth daily.      Marland Kitchen. lamivudine (EPIVIR-HBV) 5 MG/ML solution Take 5 mLs (25 mg total) by mouth daily.  240 mL  5  . LORazepam (ATIVAN) 0.5 MG tablet Take 0.5 mg by mouth every 12 (twelve) hours as needed for anxiety.      Marland Kitchen. omeprazole (PRILOSEC) 20 MG capsule Take 20 mg by mouth daily.      Marland Kitchen. oxyCODONE-acetaminophen (PERCOCET/ROXICET) 5-325 MG per tablet Take 1 tablet by mouth every 8 (eight) hours as needed for severe pain.      . polyethylene glycol (MIRALAX / GLYCOLAX) packet Take 17 g by mouth daily as needed for mild constipation.      .Marland Kitchen  raltegravir (ISENTRESS) 400 MG tablet Take 1 tablet (400 mg total) by mouth 2 (two) times daily.  60 tablet  5  . sevelamer carbonate (RENVELA) 800 MG tablet Take 2,400 mg by mouth 3 (three) times daily with meals.      Marland Kitchen. tenofovir (VIREAD) 300 MG tablet Take 1 tablet (300 mg total) by mouth once a week. Give on Friday  5 tablet  5   Scheduled:  . amLODipine  10 mg Oral QHS  . calcitRIOL  2 mcg Oral Q T,Th,Sa-HD  . cinacalcet  90 mg Oral Q breakfast  . enoxaparin (LOVENOX) injection  30 mg Subcutaneous Q24H  . enoxaparin (LOVENOX) injection  40 mg Subcutaneous Q24H  . FLUoxetine  20 mg Oral Daily  . hydrALAZINE  25 mg Oral 3 times per day  . isosorbide mononitrate  30 mg Oral Daily  . lamiVUDine  25 mg Oral Daily  . pantoprazole  40 mg Oral Daily  . raltegravir  400 mg Oral BID  . sevelamer carbonate  2,400 mg Oral TID WC  . sodium chloride  3 mL Intravenous Q12H  . [START ON 07/24/2014] tenofovir  300 mg Oral  Weekly    Assessment: 63 y.o. female presented 10/10 with chest and hip pain and hyperkalemia. PMH is significant for ESRD, HIV infection, morbid obesity, CHF, Depression/Anxiety, GERD, Anemia, Arthritis.   Lovenox 30mg  SQ q24h ordered for VTE prophylaxis and noted that pharmacy may adjust dose.  Pt's weight is 112.7 kg, Ht 64 inches,  IBW = 54.7 kg, BMI = 42. Anemia Hb 12.1, PLTC 256K. No bleeding noted.   VTE prophylaxis dose of lovenox in this patient should start at 40 mg SQ q24 hour (for BMI >30 and CrCL < 30 ml/min).  May need increase to 0.5 mg/kg q24h.  Will check steady state 4 hour heparin level and adjust as needed.   Goal of Therapy:  Heparin level 0.3-0.6 units/ml 4 hours after dose given Monitor platelets by anticoagulation protocol: Yes   Plan:  Increase lovenox dose to 40mg  SQ q24h Monitor PLTC, for s/sx of bleeding and 4 hour LMWH level per protocol  Noah Delaineuth Kaleen Rochette, RPh Clinical Pharmacist Pager: [phone] 07/19/2014,11:52 AM

## 2014-07-19 NOTE — Progress Notes (Signed)
Subjective:  No cos tolerated hd last pm  Objective Vital signs in last 24 hours: Filed Vitals:   07/18/14 2143 07/18/14 2257 07/19/14 0508 07/19/14 0857  BP: 111/64 145/86 113/62 118/64  Pulse: 107 107 99   Temp:  98.3 F (36.8 C) 98.7 F (37.1 C)   TempSrc:  Oral Oral   Resp: 16 17 15    Height:  5\' 4"  (1.626 m)    Weight:  112.674 kg (248 lb 6.4 oz)    SpO2:  98% 98%    Weight change:   Physical Exam: General: Alert, obese bf nad Heart: RRR no mur, rub or gallopp  Lungs: CTA bilat Abdomen: obese , soft NT, ND Extremities: no pedal edema  Dialysis Access: L FA AVF pos Bruit     OP HD= [facility] TTS  4 hrs,  Problem/Plan: 1 Hyperkalemia - missed several HD txs/noncompliance with low K diet- HD last pm/ reck k pending 2 ESRD on HD [facility]( But she did not arrange/notify  op  schedule  [facility]) FU in am Clipping system to there when  Kidney center opens 3 HIV on ART  4 Anemia Hb 12  No esa 5 Vol -  CXR without excess volume and on exam / BP's okay 6 R hip Pain - plain film - DJD/Osteonecrosis fem head- admit team rx  Lenny Pastelavid Zeyfang, PA-C [facility] Beeper [phone] 07/19/2014,9:17 AM  LOS: 1 day   Pt seen, examined and agree w A/P as above.  Vinson Moselleob Seldon Barrell MD pager [phone]    cell [phone] 07/19/2014, 1:52 PM    Labs: Basic Metabolic Panel:  Recent Labs Lab 07/12/14 1249 07/12/14 1651 07/12/14 1814 07/18/14 1315  NA 136* 134* 139 135*  K 7.3* 6.4* 4.1 >7.7*  CL 93* 93* 100 93*  CO2 26 23  --  24  GLUCOSE 95 59* 98 96  BUN 66* 69* 29* 76*  CREATININE 12.41* 12.46* 6.00* 11.02*  CALCIUM 9.3 9.1  --  8.5  CBC:  Recent Labs Lab 07/12/14 1249 07/12/14 1814 07/18/14 1315  WBC 7.7  --  12.3*  NEUTROABS  --   --  9.5*  HGB 11.8* 12.2 12.1  HCT 35.8* 36.0 38.1  MCV 106.5*  --  105.0*  PLT 276  --  256   Cardiac Enzymes:  Recent Labs Lab 07/18/14 1315  TROPONINI <0.30   CBG: No results  found for this basename: GLUCAP,  in the last 168 hours  Studies/Results: Dg Chest 2 View  07/18/2014   CLINICAL DATA:  Renal failure ; hypertension  EXAM: CHEST  2 VIEW  COMPARISON:  July 12, 2014  FINDINGS: There is minimal left mid lung atelectasis. Elsewhere, the lungs are clear. The heart size and pulmonary vascularity are normal. No adenopathy. No bone lesions.  IMPRESSION: Slight atelectasis on the left.  No edema or consolidation.   Electronically Signed   By: Bretta BangWilliam  Woodruff M.D.   On: 07/18/2014 15:53   Dg Hip Complete Right  07/18/2014   CLINICAL DATA:  Right hip pain.  EXAM: RIGHT HIP - COMPLETE 2+ VIEW  COMPARISON:  07/04/2014 and multiple prior radiographs.  FINDINGS: No acute fracture or dislocation. Stable advanced degenerative changes of the right hip joint including sclerosis of the femoral head with markedly irregular articular surfaces of the femoral head and acetabulum. 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 No bony lesions are identified.  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   Electronically Signed   By: Irish LackGlenn  Yamagata M.D.   On: 07/18/2014 15:56   Medications:   . amLODipine  10 mg Oral QHS  . calcitRIOL  2 mcg Oral Q T,Th,Sa-HD  . cinacalcet  90 mg Oral Q breakfast  . enoxaparin (LOVENOX) injection  30 mg Subcutaneous Q24H  . FLUoxetine  20 mg Oral Daily  . hydrALAZINE  25 mg Oral 3 times per day  . isosorbide mononitrate  30 mg Oral Daily  . lamiVUDine  25 mg Oral Daily  . pantoprazole  40 mg Oral Daily  . raltegravir  400 mg Oral BID  . sevelamer carbonate  2,400 mg Oral TID WC  . sodium chloride  3 mL Intravenous Q12H  . [START ON 07/24/2014] tenofovir  300 mg Oral Weekly

## 2014-07-19 NOTE — Progress Notes (Signed)
Utilization Review Completed.   Brentwood Cohick, RN, BSN Nurse Case Manager  

## 2014-07-19 NOTE — Progress Notes (Signed)
Paged Dr. Magnus IvanBlackman from [facility] and received prompt reply. Discussed case and he agreed patient would likely benefit from total hip replacement. Will have patient set up follow-up as an outpatient after discharge. Greatly appreciate help with this case.

## 2014-07-19 NOTE — H&P (Signed)
Family Medicine Teaching Service Attending Note  I interviewed and examined patient [name] and reviewed their tests and x-rays.  I discussed with Dr. Birdie SonsSonnenberg and reviewed their note for today.  I agree with their assessment and plan.     Additionally  Feeling better this AM no chest pain just usual hip pain Has not walked at all for months due to hip pain - using WC   HD for hyperkalemia Ro MI - doubt Ortho consult for severe hip degeneration

## 2014-07-19 NOTE — Progress Notes (Signed)
Family Medicine Teaching Service Daily Progress Note Intern Pager: [phone]  Patient name: [name] Medical record number: [id number] Date of birth: 08-23-1951 Age: 63 y.o. Gender: female  Primary Care Provider: No PCP Per Patient Consultants: Nephrology Code Status: Full code  Pt Overview and Major Events to Date:  10/10: Admitted and received dialysis  Assessment and Plan:  # Hyperkalemia: Patient is s/p dialysis last night. She has been refusing lab draws so unsure if improved. Received page that patient had short episode of v-tach and was asymptomatic and responsive. EKG this morning with sinus tachycardia and improved t-wave appearance.  - follow-up Bmet - continue to monitor on telemetry  # Right hip pain: no trauma. 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 - continue home oxycodone - consult orthopedic surgery today. May need follow-up outpatient for hip replacement - PT/OT  # Chest pain: resolved today. Leading etiology is musculoskeletal. Patient has been refusing lab draws, so troponin has not been trended. EKG reassuring. Symptoms are atypical.Initial troponin negative - follow-up A1C  # HIV infection - continue home medications (Tenofovir, Raltegravir, Lamivudine)  # HFrEF with diastolic dysfunction: last EF 56-38%35-40% on 04/16/2014. Not in exacerbation - Hemodialysis  # Anxiety/Depression - Continue home Prozac and Ativan  # Hyperparathyroidism - Continue home calcitriol, Sensipar and Renvela  # Leukocytosis: mild elevation to 12.3. Patient appears well and non-toxic or ill. Patient refusing lab draw. - follow-up CBC  FEN/GI: Renal diet with 1200mL fluid restriction, Miralax PPx: Lovenox  Disposition: discharge pending improvement of hyperkalemia and PT/OT recommendations  Subjective:  Patient states she is having pain in her right hip. She wants orthopedic surgery to see while in the hospital. This has been a problem for almost 2 years.  She has not been ambulatory for over one year  Objective: Temp:  [97.2 F (36.2 C)-98.8 F (37.1 C)] 98.6 F (37 C) (10/11 1035) Pulse Rate:  [92-110] 105 (10/11 1035) Resp:  [15-27] 20 (10/11 1035) BP: (111-181)/(56-93) 115/63 mmHg (10/11 1035) SpO2:  [94 %-100 %] 98 % (10/11 1035) Weight:  [248 lb 6.4 oz (112.674 kg)-249 lb (112.946 kg)] 248 lb 6.4 oz (112.674 kg) (10/10 2257)  Physical Exam: General: Obese, well appearing, laying in bed in no distress eating breakfast Cardiovascular: Regular rate and rhythm without murmur Respiratory: Clear to auscultation bilaterally Abdomen: Obese, soft, non-tender, non-distended Extremities: Right hip is tender. 2/5 strength on right compared to 3/5 on left. No calf tenderness. Trace edema bilaterally. Pulses intact bilaterally  Laboratory:  Recent Labs Lab 07/12/14 1249 07/12/14 1814 07/18/14 1315  WBC 7.7  --  12.3*  HGB 11.8* 12.2 12.1  HCT 35.8* 36.0 38.1  PLT 276  --  256    Recent Labs Lab 07/12/14 1249 07/12/14 1651 07/12/14 1814 07/18/14 1315  NA 136* 134* 139 135*  K 7.3* 6.4* 4.1 >7.7*  CL 93* 93* 100 93*  CO2 26 23  --  24  BUN 66* 69* 29* 76*  CREATININE 12.41* 12.46* 6.00* 11.02*  CALCIUM 9.3 9.1  --  8.5  GLUCOSE 95 59* 98 96   Cardiac Panel (last 3 results)  Recent Labs  07/18/14 1315  TROPONINI <0.30    Imaging/Diagnostic Tests: Dg Chest 2 View  07/18/2014   CLINICAL DATA:  Renal failure ; hypertension  EXAM: CHEST  2 VIEW  COMPARISON:  July 12, 2014  FINDINGS: There is minimal left mid lung atelectasis. Elsewhere, the lungs are clear. The heart size and pulmonary vascularity are normal. No  adenopathy. No bone lesions.  IMPRESSION: Slight atelectasis on the left.  No edema or consolidation.   Electronically Signed   By: Bretta BangWilliam  Woodruff M.D.   On: 07/18/2014 15:53   Dg Hip Complete Right  07/18/2014   CLINICAL DATA:  Right hip pain.  EXAM: RIGHT HIP - COMPLETE 2+ VIEW  COMPARISON:  07/04/2014 and  multiple prior radiographs.  FINDINGS: No acute fracture or dislocation. Stable advanced degenerative changes of the right hip joint including sclerosis of the femoral head with markedly irregular articular surfaces of the femoral head and acetabulum. 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 No bony lesions are identified.  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   Electronically Signed   By: Irish LackGlenn  Yamagata M.D.   On: 07/18/2014 15:56    Jacquelin Hawkingalph Jacqulyne Gladue, MD 07/19/2014, 11:54 AM PGY-2, [facility] FPTS Intern pager: [phone], text pages welcome

## 2014-07-19 NOTE — Progress Notes (Signed)
Per phlebotomy patient refused blood draw. " No. They used US to stick me." Will reschedule.

## 2014-07-20 DIAGNOSIS — M1611 Unilateral primary osteoarthritis, right hip: Secondary | ICD-10-CM

## 2014-07-20 DIAGNOSIS — N186 End stage renal disease: Secondary | ICD-10-CM

## 2014-07-20 DIAGNOSIS — D631 Anemia in chronic kidney disease: Secondary | ICD-10-CM

## 2014-07-20 DIAGNOSIS — Z992 Dependence on renal dialysis: Secondary | ICD-10-CM

## 2014-07-20 LAB — BASIC METABOLIC PANEL
Anion gap: 17 — ABNORMAL HIGH (ref 5–15)
BUN: 55 mg/dL — ABNORMAL HIGH (ref 6–23)
CHLORIDE: 94 meq/L — AB (ref 96–112)
CO2: 25 mEq/L (ref 19–32)
Calcium: 8.7 mg/dL (ref 8.4–10.5)
Creatinine, Ser: 8.22 mg/dL — ABNORMAL HIGH (ref 0.50–1.10)
GFR calc Af Amer: 5 mL/min — ABNORMAL LOW (ref >90)
GFR, EST NON AFRICAN AMERICAN: 5 mL/min — AB (ref >90)
Glucose, Bld: 95 mg/dL (ref 70–99)
Potassium: 6.4 mEq/L — ABNORMAL HIGH (ref 3.7–5.3)
SODIUM: 136 meq/L — AB (ref 137–147)

## 2014-07-20 MED ORDER — SODIUM CHLORIDE 0.9 % IV SOLN: 62.5000 mg | INTRAVENOUS | Status: DC

## 2014-07-20 MED ORDER — TENOFOVIR DISOPROXIL FUMARATE 300 MG PO TABS: 300.0000 mg | ORAL_TABLET | ORAL | Status: DC

## 2014-07-20 MED ORDER — DIPHENHYDRAMINE HCL 25 MG PO CAPS
50.0000 mg | ORAL_CAPSULE | Freq: Once | ORAL | Status: AC | PRN
  Administered 2014-07-20: 50 mg via ORAL
  Filled 2014-07-20: qty 2

## 2014-07-20 MED ORDER — LIDOCAINE-PRILOCAINE 2.5-2.5 % EX CREA: 1.0000 "application " | TOPICAL_CREAM | CUTANEOUS | Status: DC | PRN

## 2014-07-20 MED ORDER — HEPARIN SODIUM (PORCINE) 1000 UNIT/ML DIALYSIS
9800.0000 [IU] | Freq: Once | INTRAMUSCULAR | Status: AC
  Administered 2014-07-20: 9800 [IU] via INTRAVENOUS_CENTRAL
  Filled 2014-07-20: qty 10

## 2014-07-20 MED ORDER — SODIUM CHLORIDE 0.9 % IV SOLN: 100.0000 mL | INTRAVENOUS | Status: DC | PRN

## 2014-07-20 MED ORDER — BISACODYL 10 MG RE SUPP
10.0000 mg | Freq: Once | RECTAL | Status: AC
  Administered 2014-07-20: 10 mg via RECTAL
  Filled 2014-07-20: qty 1

## 2014-07-20 MED ORDER — NEPRO/CARBSTEADY PO LIQD: 237.0000 mL | ORAL | Status: DC | PRN

## 2014-07-20 MED ORDER — PENTAFLUOROPROP-TETRAFLUOROETH EX AERO: 1.0000 "application " | INHALATION_SPRAY | CUTANEOUS | Status: DC | PRN

## 2014-07-20 MED ORDER — ALTEPLASE 2 MG IJ SOLR
2.0000 mg | Freq: Once | INTRAMUSCULAR | Status: DC | PRN
  Filled 2014-07-20: qty 2

## 2014-07-20 MED ORDER — LIDOCAINE HCL (PF) 1 % IJ SOLN: 5.0000 mL | INTRAMUSCULAR | Status: DC | PRN

## 2014-07-20 MED ORDER — HEPARIN SODIUM (PORCINE) 1000 UNIT/ML DIALYSIS
1000.0000 [IU] | INTRAMUSCULAR | Status: DC | PRN
  Filled 2014-07-20: qty 1

## 2014-07-20 NOTE — Progress Notes (Signed)
OT Cancellation Note  Patient Details Name: Zettie CooleyMargaret Ann Mcneel MRN: [id number] DOB: 01-16-1951   Cancelled Treatment:     Pt currently off the floor at HD. OT will follow up as available to complete evaluation.   Nena JordanMiller, Ariba Lehnen M 07/20/2014, 2:35 PM  Carney LivingLeeAnn Marie Merrit Waugh, OTR/L Occupational Therapist [phone] (pager)

## 2014-07-20 NOTE — Progress Notes (Addendum)
Subjective:  Right hip pain (chronic), no other complaints  Objective: Vital signs in last 24 hours: Temp:  [98.1 F (36.7 C)-98.8 F (37.1 C)] 98.6 F (37 C) (10/12 0500) Pulse Rate:  [88-105] 88 (10/12 0500) Resp:  [16-20] 18 (10/12 0500) BP: (113-136)/(63-72) 136/72 mmHg (10/12 0500) SpO2:  [98 %-99 %] 99 % (10/12 0500) Weight:  [116.03 kg (255 lb 12.8 oz)] 116.03 kg (255 lb 12.8 oz) (10/11 2100) Weight change: 3.084 kg (6 lb 12.8 oz)  Intake/Output from previous day: 10/11 0701 - 10/12 0700 In: 1320 [P.O.:1320] Out: 0  Intake/Output this shift: Total I/O In: 240 [P.O.:240] Out: 0   Lab Results:  Recent Labs  07/18/14 1315  WBC 12.3*  HGB 12.1  HCT 38.1  PLT 256   BMET:  Recent Labs  07/19/14 1115 07/20/14 0500  NA 136* 136*  K 5.1 6.4*  CL 95* 94*  CO2 27 25  GLUCOSE 116* 95  BUN 36* 55*  CREATININE 6.80* 8.22*  CALCIUM 8.6 8.7   No results found for this basename: PTH,  in the last 72 hours Iron Studies: No results found for this basename: IRON, TIBC, TRANSFERRIN, FERRITIN,  in the last 72 hours  Studies/Results: No results found.  EXAM: General appearance:  Alert, in no apparent distress Resp:  CTA without rales, rhonchi, or wheezes Cardio:  RRR without murmur or rub GI:  + BS, soft and nontender Extremities:  No edema Access:  AVF @ LFA with + bruit  Dialysis Orders:  TTS @ [facility]  4 hrs    108.5 kg     400/800      2K/2.25Ca      Heparin 9800 U      AVF @ LFA Hectorol 2 mcg        No Aranesp        Venofer 50 mg qwk  Assessment/Plan: 1. Hyperkalemia - sec to missed HD (only outpatient HD last wk was 10/8 for 2:43), K 5.1 yesterday post-HD, 6.4 today.plan for hemodialysis today with K of 6.4.   2. ESRD - HD on TTS @ [facility], but [place] [facility] [place]. [facility]urs[facility] but not on Fridays...and only got a partial treatment last week.   3. HTN/Volume - BP 136/72 on Amlodipine 10 mg qhs,  Hydralazine 25 mg q8h; wt 116 kg, although CXR negative for edema. 4. Anemia - Hgb 12.1, no Aranesp, weekly Fe. 5. Sec HPT - Ca 8.7; Calcitriol 2 mcg, Sensipar 90 mg qd, Renvela 4 with meals. 6. R hip pain - DJD/Osteonecrosis fem head per x-ray, per primary.     LOS: 2 days   [name],CHARLES 07/20/2014,9:15 AM  I have seen and examined this patient and agree with plan as above with highlighted additions. HD today for hyperkalemia and likely again tomorrow.   Pt has less than ideal situation, says can get to HD on Tuesdays and Thursdays, but not Saturdays (but cuts treatments short and doesn't even go on Tues and Thurs consistently).  [place] [facility] [place] [facility] [place]. Has dialyzed in a variety of HD units past year due to not staying in one place for any length of time, d/t  intermittent homelessness ([facility], [place] [facility], [place].  Will see if the [place] [facility] [place] [facility] [place] would accept her back as a patient... A call has been left with [place] [facility] [place] Azzan Butler  B,MD 07/20/2014 12:47 PM  Addendum: Medical Director Dr. Tammi Klippelerebail at [facility] has indicated that he will never take patient back at [facility].

## 2014-07-20 NOTE — Evaluation (Signed)
Physical Therapy Evaluation Patient Details Name: [name] MRN: [id number] DOB: [birth year] Today's Date: 07/20/2014   History of Present Illness  [name] is a 63 y.o. female presenting with chest and hip pain and hyperkalemia. PMH is significant for ESRD, HIV infection, morbid obesity, CHF, Depression/Anxiety, GERD, Anemia, Arthritis.  Clinical Impression   Pt admitted with above. Pt currently with functional limitations due to the deficits listed below (see PT Problem List).  Pt will benefit from skilled PT to increase their independence and safety with mobility to allow discharge to the venue listed below.   Somewhat complicated DC planning situation:  Her current functional level warrants continued therapies at SNF;  Unfortunately, her R hip OA pain limits her ability to participate in therapies (which is why pt left SNF most recently)  I'm not sure how many SNF days covered by insurance are available to her this year;   Will it be possible for pt to see her Orthopedist/prep for and schedule for THA as an outpt from SNF? And if she does, will she have enough SNF days available to her for rehab postop?  If SNF is not an option, would recommend 24 hour assist at home, and [facility] therapies; Must also address barriers to her being compliant with HD as well.     Follow Up Recommendations SNF;Supervision/Assistance - 24 hour (See above comments)    Equipment Recommendations  Rolling walker with 5" wheels;3in1 (PT) (likely already has)    Recommendations for Other Services OT consult SW consult Continuing follow-up with Orthopedics    Precautions / Restrictions Precautions Precautions: Fall Restrictions Weight Bearing Restrictions: No Other Position/Activity Restrictions: But very painful with Weight Bearing R hip      Mobility  Bed Mobility Overal bed mobility: Needs Assistance Bed Mobility: Supine to Sit     Supine to sit: Supervision     General  bed mobility comments: noted use of bedrails and slow moving  Transfers Overall transfer level: Needs assistance Equipment used: Rolling walker (2 wheeled) Transfers: Sit to/from Stand Sit to Stand: +2 safety/equipment;Mod assist         General transfer comment: Cues for hand placement and technique, and safety; noted significantly decr control of descent with stand to sit  Ambulation/Gait Ambulation/Gait assistance: +2 safety/equipment;Mod assist Ambulation Distance (Feet): 8 Feet Assistive device: Rolling walker (2 wheeled) Gait Pattern/deviations: Step-to pattern     General Gait Details: Step-by-step cues for sequence, unweighing painful RLE in stance; mod assist for RW use/management  Stairs            Wheelchair Mobility    Modified Rankin (Stroke Patients Only)       Balance Overall balance assessment: Needs assistance   Sitting balance-Leahy Scale: Fair     Standing balance support: Bilateral upper extremity supported;During functional activity Standing balance-Leahy Scale: Poor                               Pertinent Vitals/Pain Pain Assessment: Faces Faces Pain Scale: Hurts even more Pain Location: R hip Pain Descriptors / Indicators: Aching;Discomfort Pain Intervention(s): Limited activity within patient's tolerance;Other (comment) (Eduated pt to use RW to unweight painful R hip)    Home Living Family/patient expects to be discharged to:: Private residence Living Arrangements: Children Available Help at Discharge: Family;Available PRN/intermittently Type of Home: Apartment Home Access: Stairs to enter   Entrance Stairs-Number of Steps: 2 Home Layout: One level Home Equipment:  Wheelchair - manual      Prior Function Level of Independence: Needs assistance   Gait / Transfers Assistance Needed: Pt uses manual wheelchair in the home and ambulates short distances with help from family (she does not currently have RW).  She reports  she has been having difficulty negotiating the 2 steps into the apt.   ADL's / Homemaking Assistance Needed: Pt reports she has been able to perform sponge bath at bed level.  She requires assist for dressing and IADLs.  She reports she parks her wheelchair in front of bathroom door an ambulates into BR holding onto walls, door, and sink (as noted in july)  Comments: Reports she went to a Rehab stay after last admission, and that her hip pain limited her ability to participate in therapy, so she dc'd home; At this point, pt is reporting difficulty managing at home, and difficulty with stairs, effecting her ability to get to/from HD     Hand Dominance   Dominant Hand: Right    Extremity/Trunk Assessment   Upper Extremity Assessment: Overall WFL for tasks assessed           Lower Extremity Assessment: RLE deficits/detail RLE Deficits / Details: Decr AROM and strength, limited by OA pain       Communication   Communication: No difficulties  Cognition Arousal/Alertness: Awake/alert Behavior During Therapy: WFL for tasks assessed/performed Overall Cognitive Status: Within Functional Limits for tasks assessed                      General Comments      Exercises        Assessment/Plan    PT Assessment Patient needs continued PT services  PT Diagnosis Difficulty walking;Acute pain   PT Problem List Decreased strength;Decreased range of motion;Decreased activity tolerance;Decreased balance;Decreased mobility;Decreased coordination;Decreased knowledge of use of DME;Decreased safety awareness;Pain  PT Treatment Interventions DME instruction;Gait training;Stair training;Functional mobility training;Therapeutic activities;Therapeutic exercise;Patient/family education   PT Goals (Current goals can be found in the Care Plan section) Acute Rehab PT Goals Patient Stated Goal: get R hip pain taken care of PT Goal Formulation: With patient Time For Goal Achievement:  08/03/14 Potential to Achieve Goals: Good    Frequency Min 3X/week   Barriers to discharge Decreased caregiver support;Inaccessible home environment 2 steps to enter; pt reports increasing difficulty lately -- affecting ability to get to/from HD    Co-evaluation               End of Session Equipment Utilized During Treatment: Gait belt Activity Tolerance: Patient limited by pain Patient left: in chair;with call bell/phone within reach Nurse Communication: Mobility status         Time: [id number] PT Time Calculation (min): 20 min   Charges:   PT Evaluation $Initial PT Evaluation Tier I: 1 Procedure PT Treatments $Gait Training: 8-22 mins   PT G Codes:          Olen PelGarrigan, Carzell Saldivar Hamff 07/20/2014, 10:51 AM  Van ClinesHolly Yoshino Broccoli, PT  Acute Rehabilitation Services Pager [phone] Office [phone]

## 2014-07-20 NOTE — Progress Notes (Signed)
Family Medicine Teaching Service Attending Note  I interviewed and examined patient [name] and reviewed their tests and x-rays.  I discussed with Dr. Mal MistyNetty and reviewed their note for today.  I agree with their assessment and plan.     Additionally  Feelign well except for pain in hip [facility] Dialysis Ortho consult

## 2014-07-20 NOTE — Discharge Summary (Signed)
Family Medicine Teaching Service [facility]  Patient name: Jean CooleyMargaret Ann Darrow Medical record number: [id number] Date of birth: 1951/04/10 Age: 63 y.o. Gender: female Date of Admission: 07/18/2014  Date of Discharge: 07/21/14 Admitting Physician: Carney LivingMarshall L Chambliss, MD  Primary Care Provider: No PCP Per Patient Consultants: Nephrology  Indication for Hospitalization: Chest pain and hyperkalemia  Discharge Diagnoses/Problem List:  Hyperkalemia in setting of ESRD on HD Right femoral head osteonecrosis Chest pain HIV infection HFrEF with diastolic dysfunction Anxiety/depression Hyperparathyroidism Leukocytosis  [facility]  Discharge Condition: Stable  Discharge Exam: See progress note from day of discharge  Brief Hospital Course:   [name] is a 63 y.o. female presenting with chest and hip pain and hyperkalemia. PMH is significant for ESRD, HIV infection, morbid obesity, CHF, Depression/Anxiety, GERD, Anemia, Arthritis.   # Hyperkalemia in setting of ESRD on HD: Per nephrology, patient has only been to outpatient dialysis twice during October.  Patient denies missing dialysis, but does report stopping treatment early often 2/2 cramping abdominal pain.  Nephrology followed patient throughout admission.  Potassium was 7.7 on admission, so patient underwent HD on 10/10.  Initial EKG showed peaked T waves, but after HD, repeat EKGs showed improvement in T wave appearance. Hyperkalemia resolved with this treatment to 5.1, but elevated again to 6.4 on 10/12.  Patient underwent HD again on 10/12 and 10/13, and hyperkalemia again resolved to 5.3.   # Chest pain: Patient reported sharp, left-sided CP that was exacerbated by palpation of chest wall on admission.  Though CP was atypical in presentation, patient underwent ACS rule out.  Initial and subsequent EKGs non-ischemic.  Troponins negative x2 with large interval between them (Patient refused lab  draws, so troponin not trended accordingly to usual protocol).  Chest pain resolved on 10/11 and did not recur.  Etiology was likely MSK.  Hgb A1c pending at time of discharge.  # Right hip pain: Patient reported severe R hip pain on admission.  X-ray showed osteonecrosis of femoral head.  Spoke to Orthopedics via telephone on 10/11, who said that patient is likely a candidate for total hip replacement, but this is done as an outpatient.  Home oxycodone was continued for pain control throughout admission.  PT/OT followed patient throughout admission and recommended SNF/24 hour supervision and ongoing therapy services.  # Leukocytosis: Mild elevation in WBC to 12.3 noted on admission labs. Patient appeared well and non-toxic, was afebrile with no infectious symptoms throughout admission. Patient initially refused repeat lab draw, but repeat WBC resolved to 6.9.  All other chronic medical conditions were stable and managed with home regimens throughout admission.    Issues for Follow Up:  1. F/u compliance with HD - [facility] (previously TTS, but difficulty getting transportation on Saturdays) 2. Patient will need outpatient f/u with Orthopedics for possible total hip replacement 2/2 R femoral head necrosis.  3. Patient needs ongoing home PT services 4. F/u Potassium  Significant Procedures: HD on 10/10 and 10/12 and 10/13  Significant Labs and Imaging:   Recent Labs Lab 07/18/14 1315 07/21/14 0329 07/21/14 1300  WBC 12.3* 6.9 6.8  HGB 12.1 11.5* 11.0*  HCT 38.1 36.3 33.4*  PLT 256 236 234    Recent Labs Lab 07/18/14 1315 07/19/14 1115 07/20/14 0500 07/21/14 0329 07/21/14 1300  NA 135* 136* 136* 136* 137  K >7.7* 5.1 6.4* 5.3 5.3  CL 93* 95* 94* 93* 96  CO2 24 27 25 25  27  GLUCOSE 96 116* 95 130* 101*  BUN 76* 36* 55* 30* 41*  CREATININE 11.02* 6.80* 8.22* 5.69* 7.01*  CALCIUM 8.5 8.6 8.7 9.2 8.9  PHOS  --   --   --  3.5 3.6  ALBUMIN  --   --   --   3.4* 3.3*   Cardiac Panel (last 3 results)  Recent Labs  07/19/14 1115  TROPONINI <0.30    Dg Chest 2 View (10/10):  Slight atelectasis on the left. No edema or consolidation.   Dg Hip Complete Right (10/10): Stable advanced degenerative disease of the right hip with findings consistent with osteonecrosis of the femoral head.    EKG (10/10): Peaked T waves, Repeat EKGs: Improved T wave appearance  Results/Tests Pending at Time of Discharge: Hgb A1c  Discharge Medications:    Medication List         amLODipine 5 MG tablet  Commonly known as:  NORVASC  Take 10 mg by mouth at bedtime.     calcitRIOL 0.5 MCG capsule  Commonly known as:  ROCALTROL  Take 2 mcg by mouth daily. Takes on Tuesday, Thursday, and Sat with Dialysis     cinacalcet 90 MG tablet  Commonly known as:  SENSIPAR  Take 90 mg by mouth daily.     colchicine 0.6 MG tablet  Take 0.6 mg by mouth daily.     diphenhydrAMINE 25 mg capsule  Commonly known as:  BENADRYL  Take 50 mg by mouth at bedtime as needed for sleep.     FLUoxetine 20 MG capsule  Commonly known as:  PROZAC  Take 20 mg by mouth daily.     folic acid-vitamin b complex-vitamin c-selenium-zinc 3 MG Tabs tablet  Take 1 tablet by mouth daily.     hydrALAZINE 25 MG tablet  Commonly known as:  APRESOLINE  Take 25 mg by mouth every 8 (eight) hours.     HYDROmorphone 4 MG tablet  Commonly known as:  DILAUDID  Take 0.5 tablets (2 mg total) by mouth 2 (two) times daily as needed for severe pain.     isosorbide mononitrate 30 MG 24 hr tablet  Commonly known as:  IMDUR  Take 30 mg by mouth daily.     lamivudine 5 MG/ML solution  Commonly known as:  EPIVIR-HBV  Take 5 mLs (25 mg total) by mouth daily.     LORazepam 0.5 MG tablet  Commonly known as:  ATIVAN  Take 0.5 mg by mouth every 12 (twelve) hours as needed for anxiety.     omeprazole 20 MG capsule  Commonly known as:  PRILOSEC  Take 20 mg by mouth daily.      oxyCODONE-acetaminophen 5-325 MG per tablet  Commonly known as:  PERCOCET/ROXICET  Take 1 tablet by mouth every 8 (eight) hours as needed for severe pain.     polyethylene glycol packet  Commonly known as:  MIRALAX / GLYCOLAX  Take 17 g by mouth daily as needed for mild constipation.     raltegravir 400 MG tablet  Commonly known as:  ISENTRESS  Take 1 tablet (400 mg total) by mouth 2 (two) times daily.     sevelamer carbonate 800 MG tablet  Commonly known as:  RENVELA  Take 2,400 mg by mouth 3 (three) times daily with meals.     tenofovir 300 MG tablet  Commonly known as:  VIREAD  Take 1 tablet (300 mg total) by mouth once a week. Give on Friday  Discharge Instructions: Please refer to Patient Instructions section of EMR for full details.  Patient was counseled important signs and symptoms that should prompt return to medical care, changes in medications, dietary instructions, activity restrictions, and follow up appointments.   Follow-Up Appointments: Follow-up Information   Follow up with Kathryne Hitch, MD. Schedule an appointment as soon as possible for a visit in 2 weeks. (Discuss hip replacement)    Specialty:  Orthopedic Surgery   Contact information:   [address] [place] [address] [phone]       Shirlee Latch, MD 07/21/2014, 10:22 PM PGY-1, [facility]

## 2014-07-20 NOTE — Procedures (Addendum)
I have personally attended this patient's dialysis session.   Dialysis today off schedule for K of 6.4 [facility]  [name], MD [facility] [phone] Pager 07/20/2014, 1:00 PM   Add: [facility] medical director has indicated in writing that he will NOT accept patient [facility]

## 2014-07-20 NOTE — Progress Notes (Signed)
FMTS Attending Daily Note:  Jean DonJeff Sabrinna Yearwood MD  [phone] pager  Family Practice pager:  [phone] I have seen and examined this patient and have reviewed their chart. I have discussed this patient with the resident. I agree with the resident's findings, assessment and care plan.  Additionally:  Seen during HD today. No complaints except Right hip pain.  Asking about surgery prior to DC. Ortho consulted today for what seems to be avascular necrosis of the hip Await labs s/p HD and finding HD outpt.   Jean GrimJeffrey H Tenise Stetler, MD 07/20/2014 5:28 PM

## 2014-07-20 NOTE — Progress Notes (Signed)
Family Medicine Teaching Service Daily Progress Note Intern Pager: [phone]  Patient name: [name] Medical record number: [id number] Date of birth: Oct 19, 1950 Age: 63 y.o. Gender: female  Primary Care Provider: No PCP Per Patient Consultants: Nephrology Code Status: Full code  Pt Overview and Major Events to Date:  10/10: Admitted and received dialysis 10/11: HyperK resolved s/p HD 10/12: K trending back up  Assessment and Plan: Jean CooleyMargaret Ann Weathers is a 63 y.o. female presenting with chest and hip pain and hyperkalemia. PMH is significant for ESRD, HIV infection, morbid obesity, CHF, Depression/Anxiety, GERD, Anemia, Arthritis.  # Hyperkalemia in setting of ESRD on HD: Patient is s/p dialysis 10/10. K 7.7 > 5.1 (s/p HD) > 6.4. EKG (10/11) with sinus tachycardia and improved t-wave appearance. - continue to monitor on telemetry - F/u repeat EKG for this AM - Will f/u Renal recs - may need HD again today.  # Right hip pain: no trauma. 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 - continue home oxycodone - Per Ortho - patient is likely a candidate for total hip replacement.  Will need to f/u as outpatient. - f/u PT/OT recs  # Chest pain: resolved. Leading etiology is musculoskeletal.  EKG reassuring. Symptoms are atypical. Troponins neg x2 with large interval between them (Patient refused lab draws, so troponin not trended) - follow-up A1C  # HIV infection - continue home medications (Tenofovir, Raltegravir, Lamivudine)  # HFrEF with diastolic dysfunction: last EF 14-78%35-40% on 04/16/2014. Not in exacerbation - Diuresis with Hemodialysis  # Anxiety/Depression - Continue home Prozac and Ativan  # Hyperparathyroidism - Continue home calcitriol, Sensipar and Renvela  # Leukocytosis: mild elevation to 12.3. Patient appears well and non-toxic or ill. Patient refusing lab draw. - follow-up CBC  FEN/GI: Renal diet with 1200mL fluid restriction, Miralax PPx:  Lovenox  Disposition: discharge pending improvement of hyperkalemia and PT/OT recommendations  Subjective:  Patient states she is having pain in her right hip. She is interested in going to a SNF, but not [facility].  She is interested in getting in to see the Orthopedic surgeon as soon as possible as an outpatient.  She is not interested in quitting smoking.  Objective: Temp:  [98.1 F (36.7 C)-98.8 F (37.1 C)] 98.6 F (37 C) (10/12 0500) Pulse Rate:  [88-105] 88 (10/12 0500) Resp:  [16-20] 18 (10/12 0500) BP: (113-136)/(63-72) 136/72 mmHg (10/12 0500) SpO2:  [98 %-99 %] 99 % (10/12 0500) Weight:  [255 lb 12.8 oz (116.03 kg)] 255 lb 12.8 oz (116.03 kg) (10/11 2100)  Physical Exam: General: Obese, well appearing, laying in bed in no distress Cardiovascular: RRR without murmur Respiratory: Coarse breath sounds b/l.  No w/c. Abdomen: Obese, soft, NTND, +BS Extremities: Right hip is tender. No calf tenderness or edema. Pulses intact bilaterally  Laboratory:  Recent Labs Lab 07/18/14 1315  WBC 12.3*  HGB 12.1  HCT 38.1  PLT 256    Recent Labs Lab 07/18/14 1315 07/19/14 1115 07/20/14 0500  NA 135* 136* 136*  K >7.7* 5.1 6.4*  CL 93* 95* 94*  CO2 24 27 25   BUN 76* 36* 55*  CREATININE 11.02* 6.80* 8.22*  CALCIUM 8.5 8.6 8.7  GLUCOSE 96 116* 95   Cardiac Panel (last 3 results)  Recent Labs  07/18/14 1315 07/19/14 1115  TROPONINI <0.30 <0.30    Imaging/Diagnostic Tests: Dg Chest 2 View  07/18/2014   CLINICAL DATA:  Renal failure ; hypertension  EXAM: CHEST  2 VIEW  COMPARISON:  July 12, 2014  FINDINGS: There is minimal left mid lung atelectasis. Elsewhere, the lungs are clear. The heart size and pulmonary vascularity are normal. No adenopathy. No bone lesions.  IMPRESSION: Slight atelectasis on the left.  No edema or consolidation.   Electronically Signed   By: Bretta BangWilliam  Woodruff M.D.   On: 07/18/2014 15:53   Dg Hip Complete Right  07/18/2014   CLINICAL  DATA:  Right hip pain.  EXAM: RIGHT HIP - COMPLETE 2+ VIEW  COMPARISON:  07/04/2014 and multiple prior radiographs.  FINDINGS: No acute fracture or dislocation. Stable advanced degenerative changes of the right hip joint including sclerosis of the femoral head with markedly irregular articular surfaces of the femoral head and acetabulum. 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 No bony lesions are identified.  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   Electronically Signed   By: Irish LackGlenn  Yamagata M.D.   On: 07/18/2014 15:56    Shirlee LatchAngela Tinika Bucknam, MD 07/20/2014, 9:30 AM PGY-1, [facility] Family Medicine FPTS Intern pager: [phone], text pages welcome

## 2014-07-21 DIAGNOSIS — R0600 Dyspnea, unspecified: Secondary | ICD-10-CM

## 2014-07-21 LAB — CBC
HCT: 36.3 % (ref 36.0–46.0)
HCT: 33.4 % — ABNORMAL LOW (ref 36.0–46.0)
HEMOGLOBIN: 11.5 g/dL — AB (ref 12.0–15.0)
Hemoglobin: 11 g/dL — ABNORMAL LOW (ref 12.0–15.0)
MCH: 33.4 pg (ref 26.0–34.0)
MCH: 34.2 pg — AB (ref 26.0–34.0)
MCHC: 31.7 g/dL (ref 30.0–36.0)
MCHC: 32.9 g/dL (ref 30.0–36.0)
MCV: 105.5 fL — ABNORMAL HIGH (ref 78.0–100.0)
MCV: 103.7 fL — ABNORMAL HIGH (ref 78.0–100.0)
PLATELETS: 236 10*3/uL (ref 150–400)
Platelets: 234 10*3/uL (ref 150–400)
RBC: 3.44 MIL/uL — AB (ref 3.87–5.11)
RBC: 3.22 MIL/uL — AB (ref 3.87–5.11)
RDW: 14.2 % (ref 11.5–15.5)
RDW: 14.1 % (ref 11.5–15.5)
WBC: 6.9 10*3/uL (ref 4.0–10.5)
WBC: 6.8 10*3/uL (ref 4.0–10.5)

## 2014-07-21 LAB — RENAL FUNCTION PANEL
ANION GAP: 18 — AB (ref 5–15)
Albumin: 3.4 g/dL — ABNORMAL LOW (ref 3.5–5.2)
Albumin: 3.3 g/dL — ABNORMAL LOW (ref 3.5–5.2)
Anion gap: 14 (ref 5–15)
BUN: 30 mg/dL — ABNORMAL HIGH (ref 6–23)
BUN: 41 mg/dL — ABNORMAL HIGH (ref 6–23)
CALCIUM: 8.9 mg/dL (ref 8.4–10.5)
CO2: 25 meq/L (ref 19–32)
CO2: 27 mEq/L (ref 19–32)
Calcium: 9.2 mg/dL (ref 8.4–10.5)
Chloride: 93 mEq/L — ABNORMAL LOW (ref 96–112)
Chloride: 96 mEq/L (ref 96–112)
Creatinine, Ser: 5.69 mg/dL — ABNORMAL HIGH (ref 0.50–1.10)
Creatinine, Ser: 7.01 mg/dL — ABNORMAL HIGH (ref 0.50–1.10)
GFR calc Af Amer: 7 mL/min — ABNORMAL LOW (ref >90)
GFR calc non Af Amer: 7 mL/min — ABNORMAL LOW (ref >90)
GFR, EST AFRICAN AMERICAN: 8 mL/min — AB (ref >90)
GFR, EST NON AFRICAN AMERICAN: 6 mL/min — AB (ref >90)
GLUCOSE: 130 mg/dL — AB (ref 70–99)
Glucose, Bld: 101 mg/dL — ABNORMAL HIGH (ref 70–99)
PHOSPHORUS: 3.5 mg/dL (ref 2.3–4.6)
PHOSPHORUS: 3.6 mg/dL (ref 2.3–4.6)
POTASSIUM: 5.3 meq/L (ref 3.7–5.3)
Potassium: 5.3 mEq/L (ref 3.7–5.3)
SODIUM: 136 meq/L — AB (ref 137–147)
SODIUM: 137 meq/L (ref 137–147)

## 2014-07-21 MED ORDER — HEPARIN SODIUM (PORCINE) 1000 UNIT/ML DIALYSIS
1000.0000 [IU] | INTRAMUSCULAR | Status: DC | PRN
  Filled 2014-07-21: qty 1

## 2014-07-21 MED ORDER — ALTEPLASE 2 MG IJ SOLR
2.0000 mg | Freq: Once | INTRAMUSCULAR | Status: DC | PRN
  Filled 2014-07-21: qty 2

## 2014-07-21 MED ORDER — LIDOCAINE HCL (PF) 1 % IJ SOLN: 5.0000 mL | INTRAMUSCULAR | Status: DC | PRN

## 2014-07-21 MED ORDER — HEPARIN SODIUM (PORCINE) 1000 UNIT/ML DIALYSIS
7800.0000 [IU] | Freq: Once | INTRAMUSCULAR | Status: AC
  Administered 2014-07-21: 7800 [IU] via INTRAVENOUS_CENTRAL
  Filled 2014-07-21: qty 8

## 2014-07-21 MED ORDER — LIDOCAINE-PRILOCAINE 2.5-2.5 % EX CREA: 1.0000 "application " | TOPICAL_CREAM | CUTANEOUS | Status: DC | PRN

## 2014-07-21 MED ORDER — SODIUM CHLORIDE 0.9 % IV SOLN: 100.0000 mL | INTRAVENOUS | Status: DC | PRN

## 2014-07-21 MED ORDER — PENTAFLUOROPROP-TETRAFLUOROETH EX AERO
1.0000 | INHALATION_SPRAY | CUTANEOUS | Status: DC | PRN
Start: 2014-07-21 — End: 2014-07-21

## 2014-07-21 MED ORDER — NEPRO/CARBSTEADY PO LIQD: 237.0000 mL | ORAL | Status: DC | PRN

## 2014-07-21 NOTE — Progress Notes (Signed)
07/21/2014 6:49 PM  Pt discharged home via EMS with bag of belongings.   Theadora RamaKIRKMAN, Alexanderjames Berg Brooke

## 2014-07-21 NOTE — Progress Notes (Signed)
07/21/2014 5:24 PM  Reviewed discharge instructions with patient.  Instructed patient on when her next dose of medications is due, when to be at dialysis for her treatment tomorrow, as well as the importance of adhering to her dialysis schedule and going to her appointments.  She stated she understood.  I went over follow up appointments as well as stressed the importance of following up with ortho regarding her hip replacement, to which she verbalized understanding.  Ambulance has been called.  No PIV to remove, tele off.  Awaiting transport.  Ordered dinner for patient per her request. Theadora RamaKIRKMAN, Arthi Mcdonald Brooke

## 2014-07-21 NOTE — Progress Notes (Signed)
Family Medicine Teaching Service Daily Progress Note Intern Pager: [phone]  Patient name: [name] Medical record number: [id number] Date of birth: Feb 11, 1951 Age: 63 y.o. Gender: female  Primary Care Provider: No PCP Per Patient Consultants: Nephrology Code Status: Full code  Pt Overview and Major Events to Date:  10/10: Admitted and received dialysis 10/11: HyperK resolved s/p HD 10/12: K trending back up  Assessment and Plan: Jean CooleyMargaret Ann [name] is a 63 y.o. female presenting with chest and hip pain and hyperkalemia. PMH is significant for ESRD, HIV infection, morbid obesity, CHF, Depression/Anxiety, GERD, Anemia, Arthritis.  # Hyperkalemia in setting of ESRD on HD: Patient is s/p dialysis 10/10 for K 7.7 > 5.1 (s/p HD) > 6.4 >5.3. EKG (10/11) with sinus tachycardia and improved t-wave appearance. - continue to monitor on telemetry - F/u repeat EKG today shows sinus tachycardia, with tall T-waves - Will f/u Renal recs - will get HD today and recheck K -Patient requesting to change HD days to MWF instead of TTS  # Right hip pain: no trauma. 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 - continue home oxycodone - Per Ortho - patient is likely a candidate for total hip replacement.  Will need to f/u as outpatient. - f/u PT/OT recommends SNF and 24/hr supervision however patient is not wanting to go [facility] SNF anymore [facility]  # Chest pain: resolved. Leading etiology is musculoskeletal.  EKG reassuring. Symptoms are atypical. Troponins neg x2 with large interval between them (Patient refused lab draws, so troponin not trended)  # HIV infection - continue home medications (Tenofovir, Raltegravir, Lamivudine)  # HFrEF with diastolic dysfunction: last EF 14-78%35-40% on 04/16/2014. Not in exacerbation - Diuresis with Hemodialysis  # Anxiety/Depression - Continue home Prozac and Ativan  # Hyperparathyroidism - Continue home calcitriol, Sensipar and Renvela  #  Leukocytosis: mild elevation to 12.3. Patient appears well and non-toxic or ill. Patient refusing lab draw. Resolved - follow-up CBC WBC 6.9  FEN/GI: Renal diet with 1200mL fluid restriction, Miralax PPx: Lovenox  Disposition: Discharge pending setup of outpatient HD.  Subjective:  Patient states she is having pain in her right hip. She is now not iinterested in going to a SNF, but instead is requesting HH PT. Patient states that her daughter lives with her and can help take care of her. She also states she wants to change her HD days to MWF because she is unable to get transportation on Saturday.  Patient also states that she took an enema early this morning and her stomach is still upset.  Objective: Temp:  [98 F (36.7 C)-98.5 F (36.9 C)] 98.2 F (36.8 C) (10/13 0925) Pulse Rate:  [79-100] 98 (10/13 0925) Resp:  [14-22] 20 (10/13 0925) BP: (108-139)/(55-82) 126/74 mmHg (10/13 0925) SpO2:  [95 %-99 %] 98 % (10/13 0925) Weight:  [250 lb (113.4 kg)-259 lb 14.8 oz (117.9 kg)] 250 lb (113.4 kg) (10/12 1953)  Physical Exam: General: Obese, well appearing, laying in bed in no distress Cardiovascular: RRR without murmur Respiratory: Coarse breath sounds b/l.  No wheezes or crackles appreciated. Abdomen: Obese, soft, NTND, +BS Extremities: Right hip is tender. No calf tenderness or edema.  Laboratory:  Recent Labs Lab 07/18/14 1315 07/21/14 0329  WBC 12.3* 6.9  HGB 12.1 11.5*  HCT 38.1 36.3  PLT 256 236    Recent Labs Lab 07/19/14 1115 07/20/14 0500 07/21/14 0329  NA 136* 136* 136*  K 5.1 6.4* 5.3  CL 95* 94* 93*  CO2 27 25 25   BUN 36* 55* 30*  CREATININE 6.80* 8.22* 5.69*  CALCIUM 8.6 8.7 9.2  GLUCOSE 116* 95 130*   Cardiac Panel (last 3 results)  Recent Labs  07/18/14 1315 07/19/14 1115  TROPONINI <0.30 <0.30    Imaging/Diagnostic Tests: Dg Chest 2 View 07/18/2014   IMPRESSION: Slight atelectasis on the left.  No edema or consolidation.     Dg Hip  Complete Right 07/18/2014   IMPRESSION: Stable advanced degenerative disease of the right hip with findings consistent with osteonecrosis of the femoral head.     Pincus LargeJazma Y Laketta Soderberg, DO 07/21/2014, 9:35 AM PGY-1, [place] Family Medicine FPTS Intern pager: [phone], text pages welcome

## 2014-07-21 NOTE — Progress Notes (Signed)
Pt refused to wear tele last night pt stated"My heart don"t hurt no more I don"t need this tele"explain the importance of why to wear tele, pt verbalized understanding still refused to wear tele.DR Kallie Lockshekkekandan aware no orders given,Central Telemetry notified pt put on stand by will continue to monitor Artemio Alyheryl Rennie Rouch RN

## 2014-07-21 NOTE — Procedures (Addendum)
Extra treatment today due to volume overload, then will need treatment tomorrow [facility]30 [facility] Pt indicates can get transportation any weekday (just not on Saturday) and she will arrange. HD first round tomorrow  Camille Balynthia Yaqub Arney, MD [facility] [phone] Pager 07/21/2014, 3:19 PM

## 2014-07-21 NOTE — Progress Notes (Signed)
Pt wanted to be taken off her extra hemodialysis Tx now, which is 1 hr before Tx is complete. Spoke to pt about being non compliant with Tx and informed her that she was at risk for losing her OP dialysis center if she did not go to her Tx. States she does not care because she can always call an ambulance to come and pick her up and take her to the hospital for her hemodialysis Tx. Dr. Eliott Nineunham called and informed of pt being non compliant and signing off AMA. Pt shortly after started yelling at the nurse and was going to pull her dialysis needles out of her arm herself

## 2014-07-21 NOTE — Progress Notes (Signed)
FMTS Attending Daily Note:  Renold DonJeff Amiley Shishido MD  [phone] pager  Family Practice pager:  [phone] I have seen and examined this patient and have reviewed their chart. I have discussed this patient with the resident. I agree with the resident's findings, assessment and care plan.  Additionally:  - Appreciate renal help with patient. - main issue now is finding outpt dialysis. - deferring ortho wu for hip to outpt.    Tobey GrimJeffrey H Rosielee Corporan, MD 07/21/2014 4:26 PM

## 2014-07-21 NOTE — Progress Notes (Addendum)
Subjective:  No complaints, R hip pain much better, [facility]  Objective: Vital signs in last 24 hours: Temp:  [98 F (36.7 C)-98.5 F (36.9 C)] 98 F (36.7 C) (10/13 0527) Pulse Rate:  [79-100] 96 (10/13 0527) Resp:  [14-22] 20 (10/13 0527) BP: (108-139)/(55-82) 127/72 mmHg (10/13 0527) SpO2:  [95 %-99 %] 96 % (10/13 0527) Weight:  [113.4 kg (250 lb)-117.9 kg (259 lb 14.8 oz)] 113.4 kg (250 lb) (10/12 1953) Weight change: 1.87 kg (4 lb 2 oz)  Intake/Output from previous day: 10/12 0701 - 10/13 0700 In: 900 [P.O.:900] Out: 4000  Intake/Output this shift:    EXAM:  General appearance: Alert, in no apparent distress  Resp: CTA without rales, rhonchi, or wheezes  Cardio: RRR without murmur or rub  GI: + BS, soft and nontender  Extremities: No edema  Access: AVF @ LFA with + bruit    Lab Results:  Recent Labs  07/18/14 1315 07/21/14 0329  WBC 12.3* 6.9  HGB 12.1 11.5*  HCT 38.1 36.3  PLT 256 236   BMET:  Recent Labs  07/20/14 0500 07/21/14 0329  NA 136* 136*  K 6.4* 5.3  CL 94* 93*  CO2 25 25  GLUCOSE 95 130*  BUN 55* 30*  CREATININE 8.22* 5.69*  CALCIUM 8.7 9.2  ALBUMIN  --  3.4*    Studies/Results: No results found. Marland Kitchen. amLODipine  10 mg Oral QHS  . calcitRIOL  2 mcg Oral Q T,Th,Sa-HD  . cinacalcet  90 mg Oral Q breakfast  . enoxaparin (LOVENOX) injection  40 mg Subcutaneous Q24H  . [START ON 07/23/2014] ferric gluconate (FERRLECIT/NULECIT) IV  62.5 mg Intravenous Weekly  . FLUoxetine  20 mg Oral Daily  . heparin  7,800 Units Dialysis Once in dialysis  . hydrALAZINE  25 mg Oral 3 times per day  . isosorbide mononitrate  30 mg Oral Daily  . lamiVUDine  25 mg Oral Daily  . pantoprazole  40 mg Oral Daily  . raltegravir  400 mg Oral BID  . sevelamer carbonate  2,400 mg Oral TID WC  . sodium chloride  3 mL Intravenous Q12H  . [START ON 07/24/2014] tenofovir  300 mg Oral Q Fri   Dialysis Orders: TTS @ [facility]  4 hrs  108.5 kg 400/800 2K/2.25Ca Heparin 9800 U AVF @ LFA  Hectorol 2 mcg No Aranesp Venofer 50 mg qwk  Assessment/Plan: 1. Hyperkalemia - sec to missed HD (only outpatient HD last wk was 10/8 for 2:43), inpatient HD on 10/11 & 12, K 5.3 this AM. 2. ESRD - HD on TTS @ [facility], but [place] [facility] [place]; [facility], prefers MWF, but has [place] [address] & Thu, not on Fri. I have arranged for her to [facility] - needs to be there by 11:45 AM. Not clear that she can get transportation set up by tomorrow.  Will write for first round HD in the AM. Patient is aware that she needs to arrange transportation. 3. HTN/Volume - BP 127/72 on Amlodipine 10 mg qhs, Hydralazine 25 mg q8h; wt 113.4 kg s/p net UF 4 L yesterday, CXR negative for edema.  4. Anemia - Hgb 11.5, no Aranesp, weekly Fe.  5. Sec HPT - Ca 9.2 (9.7 corrected), P 3.5; Calcitriol 2 mcg, Sensipar 90 mg qd, Renvela 4 with meals.  6. R hip pain - DJD/Osteonecrosis fem head per x-ray, better  today, per primary.    LOS: 3 days   [name],CHARLES 07/21/2014,7:18 AM  I have seen and examined this patient and agree with plan as above with highlighted additions.  Getting HD today to catch up with volume (still 5 kg over EDW).  As luck would have it, [facility] has a second shift MWF opening - pt [facility] [place] - .her chair time is 11:45 AM 2nd shift MWF.  Doubt she can get transportation arranged by tomorrow.  Written for first shift HD tomorrow  Jensyn Shave B,MD 07/21/2014 12:40 PM

## 2014-07-21 NOTE — Progress Notes (Signed)
OT Cancellation Note  Patient Details Name: [name] MRN: [id number] DOB: [birth year]   Cancelled Treatment:    Pt refused OT as she needed to make phone calls to arrange transportation for HD and was going to HD again as her schedule was changing. Will re attempt next day. Alba CoryREDDING, Kerrick Miler D 07/21/2014, 12:05 PM

## 2014-07-21 NOTE — Progress Notes (Signed)
07/21/2014 4:56 PM  Pt came back from HD beligerent, cussing at the HD nursing staff as well as myself, pt demanding to leave. States she will leave AMA if she doesn't get a discharge order.  I have clarified with Dr. Eliott Nineunham that a spot is available for [facility] to [facility] tomorrow [facility] a MWF schedule.  Dr. Eliott Nineunham informed me that she is to be there at 1130 on the dot for her treatment.  This information was relayed to the patient.  Pt remains beligerent at the nursing staff.  Dr. Doroteo GlassmanPhelps notified of patient behavior as well as her requests, orders pending for discharge.  Set up transportation via [place] with social work to get an ambulance to take the patient home.  Awaiting discharge order and paperwork. Theadora RamaKIRKMAN, Adryan Shin Brooke

## 2014-07-21 NOTE — Clinical Social Work Note (Signed)
Patient discharging home today and requested ambulance transport. EMS contacted to transport patient.  Genelle BalVanessa Brennyn Haisley, [place], [place] [phone]

## 2014-07-21 NOTE — Progress Notes (Signed)
07/21/2014 9:14 AM  Pt very cooperative and pleasant this am, however, still refusing to wear telemetry despite my attempts at educating her regarding the reasoning behind why she needs to wear it.  She is also refusing my attempt to get her OOB this am, stating that she "doesn't feel like it right now" and that her "right hip was hurting too bad".  I gave her some pain medication and told her that if she changes her mind to let me know and that I would continue to offer throughout the day to get her up.  She verbalized understanding.  Pt is also requesting to be changed from a TTS schedule at outpt HD to a MWF schedule to better facilitate her transportation issues.  She states she does not have transportation to get to dialysis on Saturdays, so MWF would be better for her.  Renal notified.  Will monitor. [name], Jaskaran Dauzat Brooke

## 2014-07-21 NOTE — Progress Notes (Signed)
07/21/2014 1:06 PM  Pt still does not want to get OOB at this time. Theadora RamaKIRKMAN, [place] Brooke

## 2014-07-21 NOTE — Care Management Note (Signed)
CARE MANAGEMENT NOTE 07/21/2014  Patient:  [name], [name]   Account Number:  [id number]  Date Initiated:  07/21/2014  Documentation initiated by:  Ivey Nembhard  Subjective/Objective Assessment:   CM following for progression and d/c planning.     Action/Plan:   Order for HHPT , met with pt and [facility] reuested to provide HHPT services.   Anticipated DC Date:  07/21/2014   Anticipated DC Plan:  [place]         Choice offered to / List presented to:          [facility] arranged  [place]   Status of service:  Completed, signed off Medicare Important Message given?  YES (If response is "NO", the following Medicare IM given date fields will be blank) Date Medicare IM given:  07/21/2014 Medicare IM given by:  Hrithik Boschee Date Additional Medicare IM given:   Additional Medicare IM given by:    Discharge Disposition:  [place]  Per UR Regulation:    If discussed at Long Length of Stay Meetings, dates discussed:    Comments:

## 2014-07-21 NOTE — Discharge Instructions (Signed)
You were admitted for chest pain and elevated potassium.  Your chest pain was likely related to chest wall tenderness.  We don't think it was related to your heart.  Your potassium was likely elevated because of missing dialysis.  It is important to go to dialysis now every Monday, Wednesday, and Friday! You have an appointment for HD tomorrow.   Hyperkalemia Hyperkalemia is when you have too much potassium in your blood. This can be a life-threatening condition. Potassium is normally removed (excreted) from the body by the kidneys. CAUSES  The potassium level in your body can become too high for the following reasons:  You take in too much potassium. You can do this by:  Using salt substitutes. They contain large amounts of potassium.  Taking potassium supplements from your caregiver. The dose may be too high for you.  Eating foods or taking nutritional products with potassium.  You excrete too little potassium. This can happen if:  Your kidneys are not functioning properly. Kidney (renal) disease is a very common cause of hyperkalemia.  You are taking medicines that lower your excretion of potassium, such as certain diuretic medicines.  You have an adrenal gland disease called Addison's disease.  You have a urinary tract obstruction, such as kidney stones.  You are on treatment to mechanically clean your blood (dialysis) and you skip a treatment.  You release a high amount of potassium from your cells into your blood. You may have a condition that causes potassium to move from your cells to your bloodstream. This can happen with:  Injury to muscles or other tissues. Most potassium is stored in the muscles.  Severe burns or infections.  Acidic blood plasma (acidosis). Acidosis can result from many diseases, such as uncontrolled diabetes. SYMPTOMS  Usually, there are no symptoms unless the potassium is dangerously high or has risen very quickly. Symptoms may include:  Irregular  or very slow heartbeat.  Feeling sick to your stomach (nauseous).  Tiredness (fatigue).  Nerve problems such as tingling of the skin, numbness of the hands or feet, weakness, or paralysis. DIAGNOSIS  A simple blood test can measure the amount of potassium in your body. An electrocardiogram test of the heart can also help make the diagnosis. The heart may beat dangerously fast or slow down and stop beating with severe hyperkalemia.  TREATMENT  Treatment depends on how bad the condition is and on the underlying cause.  If the hyperkalemia is an emergency (causing heart problems or paralysis), many different medicines can be used alone or together to lower the potassium level briefly. This may include an insulin injection even if you are not diabetic. Emergency dialysis may be needed to remove potassium from the body.  If the hyperkalemia is less severe or dangerous, the underlying cause is treated. This can include taking medicines if needed. Your prescription medicines may be changed. You may also need to take a medicine to help your body get rid of potassium. You may need to eat a diet low in potassium. HOME CARE INSTRUCTIONS   Take medicines and supplements as directed by your caregiver.  Do not take any over-the-counter medicines, supplements, natural products, herbs, or vitamins without reviewing them with your caregiver. Certain supplements and natural food products can have high amounts of potassium. Other products (such as ibuprofen) can damage weak kidneys and raise your potassium.  You may be asked to do repeat lab tests. Be sure to follow these directions.  If you have kidney disease, you  may need to follow a low potassium diet. SEEK MEDICAL CARE IF:   You notice an irregular or very slow heartbeat.  You feel lightheaded.  You develop weakness that is unusual for you. SEEK IMMEDIATE MEDICAL CARE IF:   You have shortness of breath.  You have chest discomfort.  You pass  out (faint). MAKE SURE YOU:   Understand these instructions.  Will watch your condition.  Will get help right away if you are not doing well or get worse. Document Released: 09/15/2002 Document Revised: 12/18/2011 Document Reviewed: 12/31/2013 [facility] Patient Information 2015 [place], [place]. This information is not intended to replace advice given to you by your health care provider. Make sure you discuss any questions you have with your health care provider.   Chest Pain (Nonspecific) It is often hard to give a specific diagnosis for the cause of chest pain. There is always a chance that your pain could be related to something serious, such as a heart attack or a blood clot in the lungs. You need to follow up with your health care provider for further evaluation. CAUSES   Heartburn.  Pneumonia or bronchitis.  Anxiety or stress.  Inflammation around your heart (pericarditis) or lung (pleuritis or pleurisy).  A blood clot in the lung.  A collapsed lung (pneumothorax). It can develop suddenly on its own (spontaneous pneumothorax) or from trauma to the chest.  Shingles infection (herpes zoster virus). The chest wall is composed of bones, muscles, and cartilage. Any of these can be the source of the pain.  The bones can be bruised by injury.  The muscles or cartilage can be strained by coughing or overwork.  The cartilage can be affected by inflammation and become sore (costochondritis). DIAGNOSIS  Lab tests or other studies may be needed to find the cause of your pain. Your health care provider may have you take a test called an ambulatory electrocardiogram (ECG). An ECG records your heartbeat patterns over a 24-hour period. You may also have other tests, such as:  Transthoracic echocardiogram (TTE). During echocardiography, sound waves are used to evaluate how blood flows through your heart.  Transesophageal echocardiogram (TEE).  Cardiac monitoring. This allows your health  care provider to monitor your heart rate and rhythm in real time.  Holter monitor. This is a portable device that records your heartbeat and can help diagnose heart arrhythmias. It allows your health care provider to track your heart activity for several days, if needed.  Stress tests by exercise or by giving medicine that makes the heart beat faster. TREATMENT   Treatment depends on what may be causing your chest pain. Treatment may include:  Acid blockers for heartburn.  Anti-inflammatory medicine.  Pain medicine for inflammatory conditions.  Antibiotics if an infection is present.  You may be advised to change lifestyle habits. This includes stopping smoking and avoiding alcohol, caffeine, and chocolate.  You may be advised to keep your head raised (elevated) when sleeping. This reduces the chance of acid going backward from your stomach into your esophagus. Most of the time, nonspecific chest pain will improve within 2-3 days with rest and mild pain medicine.  HOME CARE INSTRUCTIONS   If antibiotics were prescribed, take them as directed. Finish them even if you start to feel better.  For the next few days, avoid physical activities that bring on chest pain. Continue physical activities as directed.  Do not use any tobacco products, including cigarettes, chewing tobacco, or electronic cigarettes.  Avoid drinking alcohol.  Only take medicine as directed by your health care provider.  Follow your health care provider's suggestions for further testing if your chest pain does not go away.  Keep any follow-up appointments you made. If you do not go to an appointment, you could develop lasting (chronic) problems with pain. If there is any problem keeping an appointment, call to reschedule. SEEK MEDICAL CARE IF:   Your chest pain does not go away, even after treatment.  You have a rash with blisters on your chest.  You have a fever. SEEK IMMEDIATE MEDICAL CARE IF:   You have  increased chest pain or pain that spreads to your arm, neck, jaw, back, or abdomen.  You have shortness of breath.  You have an increasing cough, or you cough up blood.  You have severe back or abdominal pain.  You feel nauseous or vomit.  You have severe weakness.  You faint.  You have chills. This is an emergency. Do not wait to see if the pain will go away. Get medical help at once. Call your local emergency services (911 in [place] Do not drive yourself to the hospital. MAKE SURE YOU:   Understand these instructions.  Will watch your condition.  Will get help right away if you are not doing well or get worse. Document Released: 07/05/2005 Document Revised: 09/30/2013 Document Reviewed: 04/30/2008 [facility] Patient Information 2015 [place], [place]. This information is not intended to replace advice given to you by your health care provider. Make sure you discuss any questions you have with your health care provider.

## 2014-07-22 NOTE — Discharge Summary (Signed)
Family Medicine Teaching Service  Discharge Note : Attending Jeff Walden MD Pager [phone] Inpatient Team Pager:  [phone]  I have reviewed this patient and the patient's chart and have discussed discharge planning with the resident at the time of discharge. I agree with the discharge plan as above.    

## 2014-07-23 ENCOUNTER — Telehealth: Payer: Self-pay | Admitting: General Practice

## 2014-07-23 NOTE — Telephone Encounter (Signed)
Will forward to MD to see about these orders.  Janeane Cozart,CMA

## 2014-07-23 NOTE — Telephone Encounter (Signed)
Daughter called for her mother who was seen in the hospital by Dr. Deirdre Priesthambliss.  Her mother is not a patient of ours but she would like Dr. Deirdre Priesthambliss to send in orders to the [facility] in [place] for home health care. The fax number is [phone]. The patient does not have a PCP at this time [place]. Please call daughter at [phone]. Also if Dr. Deirdre Priesthambliss would like to take the patient on please let me. jw

## 2014-07-23 NOTE — Telephone Encounter (Signed)
Unfortunately since patient is no longer under our care I can not order home care.  Patient can apply to our practice but Dr Deirdre Priesthambliss' practice is full  Thanks

## 2014-07-24 NOTE — Telephone Encounter (Signed)
Daughter Jovita GammaLevern is aware of message.  She would like to be mailed NP paperwork.  Will send message to coordinator.  Jazmin Hartsell,CMA

## 2014-07-29 ENCOUNTER — Ambulatory Visit: Payer: Self-pay | Admitting: Internal Medicine

## 2014-08-01 ENCOUNTER — Encounter (HOSPITAL_COMMUNITY): Payer: Self-pay | Admitting: Emergency Medicine

## 2014-08-01 ENCOUNTER — Emergency Department (HOSPITAL_COMMUNITY): Payer: Medicare Other

## 2014-08-01 ENCOUNTER — Emergency Department (HOSPITAL_COMMUNITY)
Admission: EM | Admit: 2014-08-01 | Discharge: 2014-08-02 | Disposition: A | Discharge status: Home / Self Care | Payer: Medicare Other | Attending: Emergency Medicine | Admitting: Emergency Medicine

## 2014-08-01 DIAGNOSIS — I509 Heart failure, unspecified: Secondary | ICD-10-CM | POA: Diagnosis not present

## 2014-08-01 DIAGNOSIS — F419 Anxiety disorder, unspecified: Secondary | ICD-10-CM | POA: Insufficient documentation

## 2014-08-01 DIAGNOSIS — Z720 Tobacco use: Secondary | ICD-10-CM | POA: Insufficient documentation

## 2014-08-01 DIAGNOSIS — D649 Anemia, unspecified: Secondary | ICD-10-CM | POA: Insufficient documentation

## 2014-08-01 DIAGNOSIS — N186 End stage renal disease: Secondary | ICD-10-CM | POA: Diagnosis not present

## 2014-08-01 DIAGNOSIS — M25451 Effusion, right hip: Secondary | ICD-10-CM | POA: Diagnosis not present

## 2014-08-01 DIAGNOSIS — Z992 Dependence on renal dialysis: Secondary | ICD-10-CM | POA: Diagnosis not present

## 2014-08-01 DIAGNOSIS — Z79899 Other long term (current) drug therapy: Secondary | ICD-10-CM | POA: Diagnosis not present

## 2014-08-01 DIAGNOSIS — Z21 Asymptomatic human immunodeficiency virus [HIV] infection status: Secondary | ICD-10-CM | POA: Insufficient documentation

## 2014-08-01 DIAGNOSIS — I120 Hypertensive chronic kidney disease with stage 5 chronic kidney disease or end stage renal disease: Secondary | ICD-10-CM | POA: Insufficient documentation

## 2014-08-01 DIAGNOSIS — R140 Abdominal distension (gaseous): Secondary | ICD-10-CM | POA: Insufficient documentation

## 2014-08-01 DIAGNOSIS — K219 Gastro-esophageal reflux disease without esophagitis: Secondary | ICD-10-CM | POA: Diagnosis not present

## 2014-08-01 DIAGNOSIS — Z8701 Personal history of pneumonia (recurrent): Secondary | ICD-10-CM | POA: Diagnosis not present

## 2014-08-01 DIAGNOSIS — M1990 Unspecified osteoarthritis, unspecified site: Secondary | ICD-10-CM | POA: Insufficient documentation

## 2014-08-01 DIAGNOSIS — R109 Unspecified abdominal pain: Secondary | ICD-10-CM

## 2014-08-01 DIAGNOSIS — Z9889 Other specified postprocedural states: Secondary | ICD-10-CM | POA: Insufficient documentation

## 2014-08-01 LAB — CBG MONITORING, ED: Glucose-Capillary: 244 mg/dL — ABNORMAL HIGH (ref 70–99)

## 2014-08-01 LAB — ETHANOL: Alcohol, Ethyl (B): <11 mg/dL (ref 0–11)

## 2014-08-01 LAB — COMPREHENSIVE METABOLIC PANEL
ALBUMIN: 3.3 g/dL — AB (ref 3.5–5.2)
ALK PHOS: 223 U/L — AB (ref 39–117)
ALT: <5 U/L (ref 0–35)
AST: <5 U/L (ref 0–37)
Anion gap: 17 — ABNORMAL HIGH (ref 5–15)
BILIRUBIN TOTAL: 0.3 mg/dL (ref 0.3–1.2)
BUN: 38 mg/dL — AB (ref 6–23)
CHLORIDE: 94 meq/L — AB (ref 96–112)
CO2: 25 mEq/L (ref 19–32)
Calcium: 7.9 mg/dL — ABNORMAL LOW (ref 8.4–10.5)
Creatinine, Ser: 7.86 mg/dL — ABNORMAL HIGH (ref 0.50–1.10)
GFR calc non Af Amer: 5 mL/min — ABNORMAL LOW (ref >90)
GFR, EST AFRICAN AMERICAN: 6 mL/min — AB (ref >90)
GLUCOSE: 147 mg/dL — AB (ref 70–99)
POTASSIUM: 5.3 meq/L (ref 3.7–5.3)
SODIUM: 136 meq/L — AB (ref 137–147)
Total Protein: 7.7 g/dL (ref 6.0–8.3)

## 2014-08-01 LAB — CBC WITH DIFFERENTIAL/PLATELET
Basophils Absolute: 0 10*3/uL (ref 0.0–0.1)
Basophils Relative: 0 % (ref 0–1)
Eosinophils Absolute: 0.1 10*3/uL (ref 0.0–0.7)
Eosinophils Relative: 1 % (ref 0–5)
HEMATOCRIT: 33.9 % — AB (ref 36.0–46.0)
HEMOGLOBIN: 11.2 g/dL — AB (ref 12.0–15.0)
LYMPHS PCT: 39 % (ref 12–46)
Lymphs Abs: 3.2 10*3/uL (ref 0.7–4.0)
MCH: 35.3 pg — ABNORMAL HIGH (ref 26.0–34.0)
MCHC: 33 g/dL (ref 30.0–36.0)
MCV: 106.9 fL — ABNORMAL HIGH (ref 78.0–100.0)
MONO ABS: 0.7 10*3/uL (ref 0.1–1.0)
MONOS PCT: 9 % (ref 3–12)
NEUTROS ABS: 4.1 10*3/uL (ref 1.7–7.7)
NEUTROS PCT: 51 % (ref 43–77)
Platelets: 320 10*3/uL (ref 150–400)
RBC: 3.17 MIL/uL — AB (ref 3.87–5.11)
RDW: 14.3 % (ref 11.5–15.5)
WBC: 8.2 10*3/uL (ref 4.0–10.5)

## 2014-08-01 LAB — LIPASE, BLOOD: Lipase: 73 U/L — ABNORMAL HIGH (ref 11–59)

## 2014-08-01 MED ORDER — FENTANYL CITRATE 0.05 MG/ML IJ SOLN: 50.0000 ug | Freq: Once | INTRAMUSCULAR | Status: DC

## 2014-08-01 MED ORDER — SODIUM CHLORIDE 0.9 % IV BOLUS (SEPSIS): 500.0000 mL | Freq: Once | INTRAVENOUS | Status: DC

## 2014-08-01 MED ORDER — IOHEXOL 300 MG/ML  SOLN
25.0000 mL | Freq: Once | INTRAMUSCULAR | Status: AC | PRN
  Administered 2014-08-01: 25 mL via ORAL

## 2014-08-01 MED ORDER — OXYCODONE-ACETAMINOPHEN 5-325 MG PO TABS
1.0000 | ORAL_TABLET | Freq: Once | ORAL | Status: AC
  Administered 2014-08-01: 1 via ORAL
  Filled 2014-08-01: qty 1

## 2014-08-01 MED ORDER — ONDANSETRON HCL 4 MG/2ML IJ SOLN
4.0000 mg | Freq: Once | INTRAMUSCULAR | Status: DC
  Filled 2014-08-01: qty 2

## 2014-08-01 NOTE — ED Notes (Signed)
Pt refuses IV.   

## 2014-08-01 NOTE — ED Provider Notes (Signed)
CSN: [id number]     Arrival date & time 08/01/14  1845 History   First MD Initiated Contact with Patient 08/01/14 1900     Chief Complaint  Patient presents with  . Abdominal Pain     (Consider location/radiation/quality/duration/timing/severity/associated sxs/prior Treatment) HPI  Patient to the ER with complaints of [facility] for abdominal distention and discomfort. She is a hemodialysis pt due to uncontrolled hypertension. Today is her birthday and she admits to eating cake, drinking pineapple juice and drinking approx 50 + ml of rum. After this she developed nausea, vomiting and abdominal distention. She is also complaining of her chronic right hip pain that is exacerbated by movement or pressure. She denies having CP, SOB, syncope, lower extremity swelling. Pt dialysis on Monday/Wednesaday/Friday.  Past Medical History  Diagnosis Date  . Hypertension   . HIV infection   . Morbid obesity   . ESRD (end stage renal disease) on dialysis 09/30/2013    Started dialysis in [place], [place] around 2009.  ESRD due to HTN vs drug abuse according to pt.  Was on dialysis at [facility] until Feb 2015 when she was admitted to a SNF due to homelessness and drug abuse.  Then changed to [facility] on TTS schedule.     . Anginal pain   . CHF (congestive heart failure)   . Shortness of breath   . Pneumonia   . Depression   . Anxiety   . GERD (gastroesophageal reflux disease)   . Arthritis   . Anemia    Past Surgical History  Procedure Laterality Date  . Arteriovenous graft placement      left forearm  . Cardiac catheterization    . Laparotomy      states seh was cut open because seh was bleeding on the inside   Family History  Problem Relation Age of Onset  . Kidney failure Other     niece  . High blood pressure    . Lung cancer    . Breast cancer Neg Hx   . Colon cancer Neg Hx   . Stroke Mother   . HIV/AIDS Brother     died of AIDS   History  Substance Use Topics  . Smoking  status: Current Every Day Smoker -- 0.50 packs/day    Types: Cigarettes    Start date: 05/06/2014  . Smokeless tobacco: Never Used     Comment: recently quit  . Alcohol Use: No   OB History   Grav Para Term Preterm Abortions TAB SAB Ect Mult Living                 Review of Systems  10 Systems reviewed and are negative for acute change except as noted in the HPI.   Allergies  Tramadol and Morphine and related  Home Medications   Prior to Admission medications   Medication Sig Start Date End Date Taking? Authorizing Provider  amLODipine (NORVASC) 5 MG tablet Take 10 mg by mouth at bedtime.    Yes Historical Provider, MD  calcitRIOL (ROCALTROL) 0.5 MCG capsule Take 2 mcg by mouth daily. Takes on Tuesday, Thursday, and Sat with Dialysis   Yes Historical Provider, MD  cinacalcet (SENSIPAR) 90 MG tablet Take 90 mg by mouth daily.   Yes Historical Provider, MD  colchicine 0.6 MG tablet Take 0.6 mg by mouth daily.   Yes Historical Provider, MD  diphenhydrAMINE (BENADRYL) 25 mg capsule Take 50 mg by mouth at bedtime as needed for sleep.  Yes Historical Provider, MD  FLUoxetine (PROZAC) 20 MG capsule Take 20 mg by mouth daily.   Yes Historical Provider, MD  folic acid-vitamin b complex-vitamin c-selenium-zinc (DIALYVITE) 3 MG TABS tablet Take 1 tablet by mouth daily.   Yes Historical Provider, MD  hydrALAZINE (APRESOLINE) 25 MG tablet Take 25 mg by mouth every 8 (eight) hours.   Yes Historical Provider, MD  HYDROmorphone (DILAUDID) 4 MG tablet Take 0.5 tablets (2 mg total) by mouth 2 (two) times daily as needed for severe pain. 07/04/14  Yes Tomasita CrumbleAdeleke Oni, MD  isosorbide mononitrate (IMDUR) 30 MG 24 hr tablet Take 30 mg by mouth daily.   Yes Historical Provider, MD  lamivudine (EPIVIR-HBV) 5 MG/ML solution Take 5 mLs (25 mg total) by mouth daily. 07/14/14  Yes Gardiner Barefootobert W Comer, MD  LORazepam (ATIVAN) 0.5 MG tablet Take 0.5 mg by mouth every 12 (twelve) hours as needed for anxiety.   Yes  Historical Provider, MD  omeprazole (PRILOSEC) 20 MG capsule Take 20 mg by mouth daily.   Yes Historical Provider, MD  oxyCODONE-acetaminophen (PERCOCET/ROXICET) 5-325 MG per tablet Take 1 tablet by mouth every 8 (eight) hours as needed for severe pain.   Yes Historical Provider, MD  polyethylene glycol (MIRALAX / GLYCOLAX) packet Take 17 g by mouth daily as needed for mild constipation.   Yes Historical Provider, MD  raltegravir (ISENTRESS) 400 MG tablet Take 1 tablet (400 mg total) by mouth 2 (two) times daily. 07/14/14  Yes Gardiner Barefootobert W Comer, MD  sevelamer carbonate (RENVELA) 800 MG tablet Take 2,400 mg by mouth 3 (three) times daily with meals.   Yes Historical Provider, MD  tenofovir (VIREAD) 300 MG tablet Take 1 tablet (300 mg total) by mouth once a week. Give on Friday 07/14/14  Yes Gardiner Barefootobert W Comer, MD  HYDROcodone-acetaminophen (NORCO/VICODIN) 5-325 MG per tablet Take 1-2 tablets by mouth every 4 (four) hours as needed. 08/02/14   Shakeira Rhee Irine SealG Armas Mcbee, PA-C  predniSONE (DELTASONE) 5 MG tablet Take 1 tablet (5 mg total) by mouth daily. Prednisone dose pack: 30 mg on Day one (6 tabs) and two (6 tabs), 25 mg on day three (5 tabs) and four, 20 (5 tabs) mg on day five (4 tabs) and six (4 tabs), 15 mg on day seven (3 tabs) and eight (3 tabs), 10 mg on day nine (2 tabs) and ten (2 tabs), 5 mg on day eleven ( 1 tab) and twelve ( 1 tab). 08/02/14   Yandell Mcjunkins Irine SealG Hagen Tidd, PA-C   BP 138/78  Pulse 102  Temp(Src) 98.2 F (36.8 C) (Oral)  Resp 15  SpO2 97% Physical Exam  Nursing note and vitals reviewed. Constitutional: She appears well-developed and well-nourished. No distress.  HENT:  Head: Normocephalic and atraumatic.  Eyes: Pupils are equal, round, and reactive to light.  Neck: Normal range of motion. Neck supple.  Cardiovascular: Normal rate and regular rhythm.   Pulmonary/Chest: Effort normal.  Abdominal: Soft. Bowel sounds are normal. She exhibits distension. There is tenderness (diffuse). There is no  rigidity, no rebound, no guarding and no CVA tenderness.  Musculoskeletal:       Left hip: She exhibits decreased range of motion, decreased strength, tenderness and bony tenderness. She exhibits no swelling, no crepitus, no deformity and no laceration.       Legs: Exam limited by body habitus.  Neurological: She is alert.  Skin: Skin is warm and dry.    ED Course  Procedures (including critical care time) Labs Review Labs Reviewed  CBC WITH DIFFERENTIAL - Abnormal; Notable for the following:    RBC 3.17 (*)    Hemoglobin 11.2 (*)    HCT 33.9 (*)    MCV 106.9 (*)    MCH 35.3 (*)    All other components within normal limits  COMPREHENSIVE METABOLIC PANEL - Abnormal; Notable for the following:    Sodium 136 (*)    Chloride 94 (*)    Glucose, Bld 147 (*)    BUN 38 (*)    Creatinine, Ser 7.86 (*)    Calcium 7.9 (*)    Albumin 3.3 (*)    Alkaline Phosphatase 223 (*)    GFR calc non Af Amer 5 (*)    GFR calc Af Amer 6 (*)    Anion gap 17 (*)    All other components within normal limits  LIPASE, BLOOD - Abnormal; Notable for the following:    Lipase 73 (*)    All other components within normal limits  CBG MONITORING, ED - Abnormal; Notable for the following:    Glucose-Capillary 244 (*)    All other components within normal limits  ETHANOL  URINE RAPID DRUG SCREEN (HOSP PERFORMED)    Imaging Review Ct Abdomen Pelvis Wo Contrast  08/01/2014   CLINICAL DATA:  Abdominal pain and distention. Alcohol intoxication. Right hip pain.  EXAM: CT ABDOMEN AND PELVIS WITHOUT CONTRAST  TECHNIQUE: Multidetector CT imaging of the abdomen and pelvis was performed following the standard protocol without IV contrast.  COMPARISON:  01/17/2014  FINDINGS: Dependent atelectasis in the lung bases. There is a 4 mm nodule in the right lung base. If the patient is at high risk for bronchogenic carcinoma, follow-up chest CT at 1 year is recommended. If the patient is at low risk, no follow-up is needed.  This recommendation follows the consensus statement: Guidelines for Management of Small Pulmonary Nodules Detected on CT Scans: A Statement from the Fleischner Society as published in Radiology 2005; 237:395-400.  Calcified granulomas in the liver. Both kidneys are significantly atrophic. No hydronephrosis. Duodenum diverticulum. Vascular calcifications in the abdominal aorta without aneurysm. The unenhanced appearance of the gallbladder, pancreas, spleen, adrenal glands, inferior vena cava, retroperitoneal lymph nodes, stomach, small bowel, and colon is unremarkable. Contrast material flows through to the colon without evidence of small bowel obstruction. No bowel distention. There is a midline ventral anterior abdominal wall hernia containing fat. No free air or free fluid in the abdomen.  Pelvis: Appendix is normal. Calcification in the uterus consistent with fibroids. No abnormal adnexal masses. No pelvic mass or lymphadenopathy. Bladder is decompressed. No evidence of diverticulitis. No free or loculated pelvic fluid collections. Degenerative changes in the lumbar spine and hips. Bones are diffusely increased in density. Given the small kidneys, this probably represents renal osteodystrophy although sickle cell changes or diffuse metastasis could have this appearance. Multiple Schmorl's nodes. The right hip demonstrates diffuse bone erosion of the acetabular and femoral surfaces with prominent right hip effusion. Changes suggest possible septic arthritis.  IMPRESSION: No evidence of bowel obstruction. Diffuse renal atrophy. 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 Cortical erosion in the right hip with large right hip effusion suggest possible septic arthritis.   Electronically Signed   By: Burman Nieves M.D.   On: 08/01/2014 23:38     EKG Interpretation None      MDM    Final diagnoses:  Abdominal pain  Right  hip joint effusion    Care delayed by inability to obtain IV access and patient being uncooperative in drinking contrast.  Her vitals have remained stable throughout her stay.  Filed Vitals:   08/02/14 0045  BP: 138/78  Pulse: 102  Temp:   Resp:     Pain controlled with PO Percocet. Will consult Orthopedic Surgery to consult for joint aspiration and culture due to concern on CT scan of joint possibly being infected. Dr. Romeo AppleHarrison made aware of plan and findings.  I spoke with Dr. Charlann Boxerlin (ortho)- he reviewed case and does not feel that the patients symptoms, lab work and CT scan are consistent with septic arthritis. He does not feel that emergent aspiration of the joint is needed. He does not feel that she need to be admitted to have joint aspiration done by US guided procedure tomorrow morning as well. He recommends giving a shot of Toradol in the ED, pain medication and Prednisone dose pack; 5mg  tabs, start with 30mg  and decreased by 5mg  every two days for 12 days.  Pt made aware of plan and is agreeable. Understands plan and that she needs to call ortho. She is afebrile, has no white count and does not appear ill. Her abd CT scan is otherwise unremarkable.  Dr. Romeo AppleHarrison updated that pt wil be transferred back home.  63 y.o.[name]'s evaluation in the Emergency Department is complete. It has been determined that no acute conditions requiring further emergency intervention are present at this time. The patient/guardian have been advised of the diagnosis and plan. We have discussed signs and symptoms that warrant return to the ED, such as changes or worsening in symptoms.  Vital signs are stable at discharge. Filed Vitals:   08/02/14 0045  BP: 138/78  Pulse: 102  Temp:   Resp:     Patient/guardian has voiced understanding and agreed to follow-up with the PCP or specialist.     Dorthula Matasiffany G Mayukha Symmonds, PA-C 08/02/14 0125

## 2014-08-01 NOTE — ED Notes (Signed)
Pt transported to CT ?

## 2014-08-01 NOTE — ED Notes (Signed)
[facility] for [facility] of abdominal distention and discomfort. States she is hemodialysis patient, treatments on Monday, Wednesday and Friday. Pt admits to drinking rum today, now states abdominal distention. Also states chronic R hip pain. Pt is alert and oriented x4.

## 2014-08-01 NOTE — ED Notes (Signed)
Pt drank 1 cup of contrast. PA okay with this. CT notified.

## 2014-08-02 DIAGNOSIS — R140 Abdominal distension (gaseous): Secondary | ICD-10-CM | POA: Diagnosis not present

## 2014-08-02 MED ORDER — PREDNISONE 5 MG PO TABS: 5.0000 mg | ORAL_TABLET | Freq: Every day | ORAL | Status: DC

## 2014-08-02 MED ORDER — KETOROLAC TROMETHAMINE 30 MG/ML IJ SOLN
30.0000 mg | Freq: Once | INTRAMUSCULAR | Status: AC
  Administered 2014-08-02: 30 mg via INTRAVENOUS
  Filled 2014-08-02: qty 1

## 2014-08-02 MED ORDER — HYDROCODONE-ACETAMINOPHEN 5-325 MG PO TABS: 1.0000 | ORAL_TABLET | ORAL | Status: DC | PRN

## 2014-08-02 MED ORDER — ACETAMINOPHEN 500 MG PO TABS: 500.0000 mg | ORAL_TABLET | Freq: Four times a day (QID) | ORAL | Status: DC | PRN

## 2014-08-02 NOTE — ED Provider Notes (Signed)
Medical screening examination/treatment/procedure(s) were conducted as a shared visit with non-physician practitioner(s) and myself.  I personally evaluated the patient during the encounter.   EKG Interpretation None      I interviewed and examined the patient. Lungs are CTAB. Cardiac exam wnl. Abdomen soft, mildly distended.  Pt here w/ multiple complaints. CT showing right hip effusion. Pt notes chronic right hip pain. Case discussed w/ Dr. Charlann Boxerlin who does not think this is a septic hip. Her pain is chronic, she has had no fevers, no elev in wbc, normal appearance of hip. Will tx as outpt and rec close f/u w/ Dr. Charlann Boxerlin.   Purvis SheffieldForrest Camila Norville, MD 08/02/14 1059

## 2014-08-02 NOTE — Discharge Instructions (Signed)
Hip Bursitis Bursitis is a swelling and soreness (inflammation) of a fluid-filled sac (bursa). This sac overlies and protects the joints.  CAUSES   Injury.  Overuse of the muscles surrounding the joint.  Arthritis.  Gout.  Infection.  Cold weather.  Inadequate warm-up and conditioning prior to activities. The cause may not be known.  SYMPTOMS   Mild to severe irritation.  Tenderness and swelling over the outside of the hip.  Pain with motion of the hip.  If the bursa becomes infected, a fever may be present. Redness, tenderness, and warmth will develop over the hip. Symptoms usually lessen in 3 to 4 weeks with treatment, but can come back. TREATMENT If conservative treatment does not work, your caregiver may advise draining the bursa and injecting cortisone into the area. This may speed up the healing process. This may also be used as an initial treatment of choice. HOME CARE INSTRUCTIONS   Apply ice to the affected area for 15-20 minutes every 3 to 4 hours while awake for the first 2 days. Put the ice in a plastic bag and place a towel between the bag of ice and your skin.  Rest the painful joint as much as possible, but continue to put the joint through a normal range of motion at least 4 times per day. When the pain lessens, begin normal, slow movements and usual activities to help prevent stiffness of the hip.  Only take over-the-counter or prescription medicines for pain, discomfort, or fever as directed by your caregiver.  Use crutches to limit weight bearing on the hip joint, if advised.  Elevate your painful hip to reduce swelling. Use pillows for propping and cushioning your legs and hips.  Gentle massage may provide comfort and decrease swelling. SEEK IMMEDIATE MEDICAL CARE IF:   Your pain increases even during treatment, or you are not improving.  You have a fever.  You have heat and inflammation over the involved bursa.  You have any other questions or  concerns. MAKE SURE YOU:   Understand these instructions.  Will watch your condition.  Will get help right away if you are not doing well or get worse. Document Released: 03/17/2002 Document Revised: 12/18/2011 Document Reviewed: 10/14/2008 ExitCare Patient Information 2015 [employer]. This information is not intended to replace advice given to you by your health care provider. Make sure you discuss any questions you have with your health care provider.  

## 2014-08-02 NOTE — ED Notes (Signed)
[facility] called for transport.  

## 2014-08-10 ENCOUNTER — Encounter (HOSPITAL_COMMUNITY): Payer: Self-pay | Admitting: Emergency Medicine

## 2014-08-10 ENCOUNTER — Emergency Department (HOSPITAL_COMMUNITY): Payer: Medicare Other

## 2014-08-10 ENCOUNTER — Inpatient Hospital Stay (HOSPITAL_COMMUNITY)
Admission: EM | Admit: 2014-08-10 | Discharge: 2014-08-25 | DRG: 640 | Disposition: A | Discharge status: Home / Self Care | Payer: Medicare Other | Attending: Family Medicine | Admitting: Family Medicine

## 2014-08-10 DIAGNOSIS — K5900 Constipation, unspecified: Secondary | ICD-10-CM | POA: Diagnosis not present

## 2014-08-10 DIAGNOSIS — E875 Hyperkalemia: Secondary | ICD-10-CM | POA: Diagnosis not present

## 2014-08-10 DIAGNOSIS — M879 Osteonecrosis, unspecified: Secondary | ICD-10-CM | POA: Diagnosis present

## 2014-08-10 DIAGNOSIS — F419 Anxiety disorder, unspecified: Secondary | ICD-10-CM | POA: Diagnosis present

## 2014-08-10 DIAGNOSIS — I120 Hypertensive chronic kidney disease with stage 5 chronic kidney disease or end stage renal disease: Secondary | ICD-10-CM | POA: Diagnosis present

## 2014-08-10 DIAGNOSIS — B20 Human immunodeficiency virus [HIV] disease: Secondary | ICD-10-CM | POA: Insufficient documentation

## 2014-08-10 DIAGNOSIS — Z992 Dependence on renal dialysis: Secondary | ICD-10-CM

## 2014-08-10 DIAGNOSIS — N2581 Secondary hyperparathyroidism of renal origin: Secondary | ICD-10-CM | POA: Diagnosis present

## 2014-08-10 DIAGNOSIS — Z7952 Long term (current) use of systemic steroids: Secondary | ICD-10-CM

## 2014-08-10 DIAGNOSIS — F329 Major depressive disorder, single episode, unspecified: Secondary | ICD-10-CM | POA: Diagnosis present

## 2014-08-10 DIAGNOSIS — R079 Chest pain, unspecified: Secondary | ICD-10-CM | POA: Insufficient documentation

## 2014-08-10 DIAGNOSIS — M1611 Unilateral primary osteoarthritis, right hip: Secondary | ICD-10-CM | POA: Diagnosis present

## 2014-08-10 DIAGNOSIS — K219 Gastro-esophageal reflux disease without esophagitis: Secondary | ICD-10-CM | POA: Diagnosis present

## 2014-08-10 DIAGNOSIS — I959 Hypotension, unspecified: Secondary | ICD-10-CM | POA: Diagnosis not present

## 2014-08-10 DIAGNOSIS — F1410 Cocaine abuse, uncomplicated: Secondary | ICD-10-CM | POA: Diagnosis present

## 2014-08-10 DIAGNOSIS — N898 Other specified noninflammatory disorders of vagina: Secondary | ICD-10-CM | POA: Insufficient documentation

## 2014-08-10 DIAGNOSIS — F411 Generalized anxiety disorder: Secondary | ICD-10-CM | POA: Diagnosis present

## 2014-08-10 DIAGNOSIS — I5043 Acute on chronic combined systolic (congestive) and diastolic (congestive) heart failure: Secondary | ICD-10-CM | POA: Diagnosis present

## 2014-08-10 DIAGNOSIS — I5042 Chronic combined systolic (congestive) and diastolic (congestive) heart failure: Secondary | ICD-10-CM | POA: Insufficient documentation

## 2014-08-10 DIAGNOSIS — I10 Essential (primary) hypertension: Secondary | ICD-10-CM | POA: Insufficient documentation

## 2014-08-10 DIAGNOSIS — F17210 Nicotine dependence, cigarettes, uncomplicated: Secondary | ICD-10-CM | POA: Diagnosis present

## 2014-08-10 DIAGNOSIS — Z6841 Body Mass Index (BMI) 40.0 and over, adult: Secondary | ICD-10-CM

## 2014-08-10 DIAGNOSIS — N186 End stage renal disease: Secondary | ICD-10-CM | POA: Diagnosis present

## 2014-08-10 DIAGNOSIS — Z830 Family history of human immunodeficiency virus [HIV] disease: Secondary | ICD-10-CM

## 2014-08-10 DIAGNOSIS — D631 Anemia in chronic kidney disease: Secondary | ICD-10-CM | POA: Diagnosis present

## 2014-08-10 DIAGNOSIS — Z823 Family history of stroke: Secondary | ICD-10-CM

## 2014-08-10 DIAGNOSIS — Z21 Asymptomatic human immunodeficiency virus [HIV] infection status: Secondary | ICD-10-CM | POA: Diagnosis present

## 2014-08-10 DIAGNOSIS — D72829 Elevated white blood cell count, unspecified: Secondary | ICD-10-CM | POA: Diagnosis present

## 2014-08-10 DIAGNOSIS — D539 Nutritional anemia, unspecified: Secondary | ICD-10-CM | POA: Insufficient documentation

## 2014-08-10 HISTORY — DX: Hyperkalemia: E87.5

## 2014-08-10 LAB — CBC
HCT: 35 % — ABNORMAL LOW (ref 36.0–46.0)
Hemoglobin: 11.1 g/dL — ABNORMAL LOW (ref 12.0–15.0)
MCH: 34.4 pg — ABNORMAL HIGH (ref 26.0–34.0)
MCHC: 31.7 g/dL (ref 30.0–36.0)
MCV: 108.4 fL — AB (ref 78.0–100.0)
PLATELETS: 224 10*3/uL (ref 150–400)
RBC: 3.23 MIL/uL — ABNORMAL LOW (ref 3.87–5.11)
RDW: 14.9 % (ref 11.5–15.5)
WBC: 10.7 10*3/uL — ABNORMAL HIGH (ref 4.0–10.5)

## 2014-08-10 LAB — CBG MONITORING, ED
GLUCOSE-CAPILLARY: 47 mg/dL — AB (ref 70–99)
Glucose-Capillary: 132 mg/dL — ABNORMAL HIGH (ref 70–99)
Glucose-Capillary: 104 mg/dL — ABNORMAL HIGH (ref 70–99)

## 2014-08-10 LAB — BASIC METABOLIC PANEL
Anion gap: 21 — ABNORMAL HIGH (ref 5–15)
BUN: 75 mg/dL — ABNORMAL HIGH (ref 6–23)
CHLORIDE: 93 meq/L — AB (ref 96–112)
CO2: 24 mEq/L (ref 19–32)
CREATININE: 12.19 mg/dL — AB (ref 0.50–1.10)
Calcium: 8.3 mg/dL — ABNORMAL LOW (ref 8.4–10.5)
GFR, EST AFRICAN AMERICAN: 3 mL/min — AB (ref >90)
GFR, EST NON AFRICAN AMERICAN: 3 mL/min — AB (ref >90)
Glucose, Bld: 164 mg/dL — ABNORMAL HIGH (ref 70–99)
Potassium: 6.8 mEq/L (ref 3.7–5.3)
Sodium: 138 mEq/L (ref 137–147)

## 2014-08-10 LAB — TROPONIN I

## 2014-08-10 LAB — I-STAT TROPONIN, ED: Troponin i, poc: 0.03 ng/mL (ref 0.00–0.08)

## 2014-08-10 LAB — GLUCOSE, CAPILLARY: GLUCOSE-CAPILLARY: 200 mg/dL — AB (ref 70–99)

## 2014-08-10 LAB — PRO B NATRIURETIC PEPTIDE: Pro B Natriuretic peptide (BNP): 16099 pg/mL — ABNORMAL HIGH (ref 0–125)

## 2014-08-10 MED ORDER — COLCHICINE 0.6 MG PO TABS
0.6000 mg | ORAL_TABLET | Freq: Every day | ORAL | Status: DC
  Administered 2014-08-11: 0.6 mg via ORAL
  Filled 2014-08-10: qty 1

## 2014-08-10 MED ORDER — LAMIVUDINE 5 MG/ML PO SOLN: 25.0000 mg | Freq: Every day | ORAL | Status: DC

## 2014-08-10 MED ORDER — RENA-VITE PO TABS: 1.0000 | ORAL_TABLET | Freq: Every day | ORAL | Status: DC

## 2014-08-10 MED ORDER — DIALYVITE 3000 3 MG PO TABS: 1.0000 | ORAL_TABLET | Freq: Every day | ORAL | Status: DC

## 2014-08-10 MED ORDER — ISOSORBIDE MONONITRATE ER 30 MG PO TB24
30.0000 mg | ORAL_TABLET | Freq: Every day | ORAL | Status: DC
  Administered 2014-08-11 – 2014-08-25 (×15): 30 mg via ORAL
  Filled 2014-08-10 (×16): qty 1

## 2014-08-10 MED ORDER — RENA-VITE PO TABS
1.0000 | ORAL_TABLET | Freq: Every day | ORAL | Status: DC
  Administered 2014-08-10 – 2014-08-24 (×13): 1 via ORAL
  Filled 2014-08-10 (×15): qty 1

## 2014-08-10 MED ORDER — LORAZEPAM 0.5 MG PO TABS
0.5000 mg | ORAL_TABLET | Freq: Two times a day (BID) | ORAL | Status: DC | PRN
  Administered 2014-08-11 – 2014-08-24 (×15): 0.5 mg via ORAL
  Filled 2014-08-10 (×15): qty 1

## 2014-08-10 MED ORDER — AMLODIPINE BESYLATE 10 MG PO TABS
10.0000 mg | ORAL_TABLET | Freq: Every day | ORAL | Status: DC
  Administered 2014-08-11 – 2014-08-23 (×11): 10 mg via ORAL
  Filled 2014-08-10 (×15): qty 1

## 2014-08-10 MED ORDER — SODIUM CHLORIDE 0.9 % IV SOLN
1.0000 g | Freq: Once | INTRAVENOUS | Status: AC
  Administered 2014-08-10: 1 g via INTRAVENOUS
  Filled 2014-08-10: qty 10

## 2014-08-10 MED ORDER — TENOFOVIR DISOPROXIL FUMARATE 300 MG PO TABS: 300.0000 mg | ORAL_TABLET | ORAL | Status: DC

## 2014-08-10 MED ORDER — PANTOPRAZOLE SODIUM 40 MG PO TBEC
40.0000 mg | DELAYED_RELEASE_TABLET | Freq: Every day | ORAL | Status: DC
  Administered 2014-08-10 – 2014-08-25 (×16): 40 mg via ORAL
  Filled 2014-08-10 (×14): qty 1

## 2014-08-10 MED ORDER — POLYETHYLENE GLYCOL 3350 17 G PO PACK
17.0000 g | PACK | Freq: Every day | ORAL | Status: DC | PRN
  Filled 2014-08-10: qty 1

## 2014-08-10 MED ORDER — SEVELAMER CARBONATE 800 MG PO TABS
2400.0000 mg | ORAL_TABLET | Freq: Three times a day (TID) | ORAL | Status: DC
  Administered 2014-08-11 – 2014-08-25 (×24): 2400 mg via ORAL
  Filled 2014-08-10 (×46): qty 3

## 2014-08-10 MED ORDER — CALCITRIOL 0.5 MCG PO CAPS
2.2500 ug | ORAL_CAPSULE | ORAL | Status: DC
  Filled 2014-08-10: qty 1

## 2014-08-10 MED ORDER — HYDROCODONE-ACETAMINOPHEN 5-325 MG PO TABS
1.0000 | ORAL_TABLET | ORAL | Status: DC | PRN
  Administered 2014-08-11: 2 via ORAL

## 2014-08-10 MED ORDER — SODIUM CHLORIDE 0.9 % IV SOLN: 250.0000 mL | INTRAVENOUS | Status: DC | PRN

## 2014-08-10 MED ORDER — RALTEGRAVIR POTASSIUM 400 MG PO TABS
400.0000 mg | ORAL_TABLET | Freq: Two times a day (BID) | ORAL | Status: DC
  Administered 2014-08-10 – 2014-08-11 (×2): 400 mg via ORAL
  Filled 2014-08-10 (×3): qty 1

## 2014-08-10 MED ORDER — SODIUM CHLORIDE 0.9 % IV SOLN
62.5000 mg | INTRAVENOUS | Status: DC
  Administered 2014-08-12 – 2014-08-19 (×2): 62.5 mg via INTRAVENOUS
  Filled 2014-08-10 (×4): qty 5

## 2014-08-10 MED ORDER — CINACALCET HCL 30 MG PO TABS
90.0000 mg | ORAL_TABLET | Freq: Every day | ORAL | Status: DC
  Administered 2014-08-11 – 2014-08-25 (×14): 90 mg via ORAL
  Filled 2014-08-10 (×18): qty 3

## 2014-08-10 MED ORDER — SODIUM CHLORIDE 0.9 % IJ SOLN: 3.0000 mL | INTRAMUSCULAR | Status: DC | PRN

## 2014-08-10 MED ORDER — HEPARIN SODIUM (PORCINE) 5000 UNIT/ML IJ SOLN
5000.0000 [IU] | Freq: Three times a day (TID) | INTRAMUSCULAR | Status: DC
  Administered 2014-08-10 – 2014-08-21 (×8): 5000 [IU] via SUBCUTANEOUS
  Filled 2014-08-10 (×47): qty 1

## 2014-08-10 MED ORDER — DIPHENHYDRAMINE HCL 25 MG PO CAPS
50.0000 mg | ORAL_CAPSULE | Freq: Every evening | ORAL | Status: DC | PRN
  Administered 2014-08-11 – 2014-08-23 (×13): 50 mg via ORAL
  Filled 2014-08-10 (×13): qty 2

## 2014-08-10 MED ORDER — DARBEPOETIN ALFA 25 MCG/0.42ML IJ SOSY
25.0000 ug | PREFILLED_SYRINGE | INTRAMUSCULAR | Status: DC
  Filled 2014-08-10: qty 0.42

## 2014-08-10 MED ORDER — HYDROMORPHONE HCL 2 MG PO TABS
2.0000 mg | ORAL_TABLET | Freq: Two times a day (BID) | ORAL | Status: DC | PRN
  Administered 2014-08-11 – 2014-08-14 (×4): 2 mg via ORAL
  Filled 2014-08-10 (×4): qty 1

## 2014-08-10 MED ORDER — CALCITRIOL 0.5 MCG PO CAPS
2.0000 ug | ORAL_CAPSULE | ORAL | Status: DC
  Administered 2014-08-11 – 2014-08-21 (×6): 2 ug via ORAL
  Filled 2014-08-10 (×9): qty 4

## 2014-08-10 MED ORDER — HYDRALAZINE HCL 25 MG PO TABS
25.0000 mg | ORAL_TABLET | Freq: Three times a day (TID) | ORAL | Status: DC
  Administered 2014-08-11 – 2014-08-13 (×5): 25 mg via ORAL
  Filled 2014-08-10 (×10): qty 1

## 2014-08-10 MED ORDER — DEXTROSE 10 % IV SOLN
Freq: Once | INTRAVENOUS | Status: AC
  Administered 2014-08-10: 18:00:00 via INTRAVENOUS

## 2014-08-10 MED ORDER — LAMIVUDINE 10 MG/ML PO SOLN
25.0000 mg | Freq: Every day | ORAL | Status: DC
  Administered 2014-08-10 – 2014-08-11 (×2): 25 mg via ORAL
  Filled 2014-08-10 (×2): qty 5

## 2014-08-10 MED ORDER — SEVELAMER CARBONATE 800 MG PO TABS
3200.0000 mg | ORAL_TABLET | Freq: Three times a day (TID) | ORAL | Status: DC
  Filled 2014-08-10: qty 4

## 2014-08-10 MED ORDER — CINACALCET HCL 30 MG PO TABS
60.0000 mg | ORAL_TABLET | Freq: Two times a day (BID) | ORAL | Status: DC
  Filled 2014-08-10: qty 2

## 2014-08-10 MED ORDER — INSULIN ASPART 100 UNIT/ML IV SOLN
10.0000 [IU] | Freq: Once | INTRAVENOUS | Status: AC
  Administered 2014-08-10: 10 [IU] via INTRAVENOUS
  Filled 2014-08-10: qty 1

## 2014-08-10 MED ORDER — SODIUM CHLORIDE 0.9 % IJ SOLN
3.0000 mL | Freq: Two times a day (BID) | INTRAMUSCULAR | Status: DC
  Administered 2014-08-11 – 2014-08-14 (×3): 3 mL via INTRAVENOUS

## 2014-08-10 MED ORDER — SODIUM CHLORIDE 0.9 % IJ SOLN
3.0000 mL | Freq: Two times a day (BID) | INTRAMUSCULAR | Status: DC
  Administered 2014-08-10 – 2014-08-16 (×12): 3 mL via INTRAVENOUS

## 2014-08-10 MED ORDER — ALBUTEROL SULFATE (2.5 MG/3ML) 0.083% IN NEBU
2.5000 mg | INHALATION_SOLUTION | RESPIRATORY_TRACT | Status: DC | PRN
  Administered 2014-08-10: 2.5 mg via RESPIRATORY_TRACT
  Filled 2014-08-10 (×2): qty 3

## 2014-08-10 MED ORDER — FLUOXETINE HCL 20 MG PO CAPS
20.0000 mg | ORAL_CAPSULE | Freq: Every day | ORAL | Status: DC
  Administered 2014-08-11 – 2014-08-25 (×15): 20 mg via ORAL
  Filled 2014-08-10 (×16): qty 1

## 2014-08-10 NOTE — ED Notes (Addendum)
Pt now refusing to have IV and refusing for IV team to start IV. Admitting MD paged.

## 2014-08-10 NOTE — ED Notes (Signed)
Dr Bradshaw at bedside 

## 2014-08-10 NOTE — Progress Notes (Signed)
Patient refused lab draw. [name], [name], RCharity fundraiser

## 2014-08-10 NOTE — ED Notes (Signed)
Per [facility] Patient came from home and has complaints of Chest pain started last night. Pain is in the L breast radiate to the R breast . Patient was given 1 Nitro and 324 Asprin per EMS patient refused any other medication. Patient also missed her dyalisis  this morning due to her ride.

## 2014-08-10 NOTE — ED Notes (Signed)
Admitting MD returned page. Pt refused to talk to the MD over the phone. MD will come see pt. Dialysis called to see when dialysis would be performed and dialysis stated it would be around 2000.

## 2014-08-10 NOTE — ED Notes (Signed)
Pt stating that she is having CP that comes and goes. Denies CP at this time.

## 2014-08-10 NOTE — ED Provider Notes (Signed)
Emergency Ultrasound Study:   Angiocath insertion Performed by: Blake DivineWOFFORD, Maxyne Derocher DAVID III  Consent: Verbal consent obtained. Risks and benefits: risks, benefits and alternatives were discussed Immediately prior to procedure the correct patient, procedure, equipment, support staff and site/side marked as needed.  Indication: difficult IV access Preparation: Patient was prepped and draped in the usual sterile fashion. Vein Location: right brachial vein was visualized during assessment for potential access sites and was found to be patent/ easily compressed with linear ultrasound.  The needle was visualized with real-time ultrasound and guided into the vein. Gauge: 20  Image not saved due to computer malfunction.  Normal blood return.  Patient tolerance: Patient tolerated the procedure well with no immediate complications.      Warnell Foresterrey Tyge Somers, MD 08/10/14 [phone]

## 2014-08-10 NOTE — ED Notes (Signed)
Attempted report 

## 2014-08-10 NOTE — H&P (Signed)
Family Medicine Teaching [facility] Admission History and Physical Service Pager: [phone]  Patient name: [name] Medical record number: [id number] Date of birth: [birth year] Age: 63 y.o. Gender: female  Primary Care Provider: No PCP Per Patient  Chief Complaint: Missed dialysis, chest pain  Assessment and Plan: [name] is a 63 y.o. year old female presenting with Hyperkalemia after missing dialysis and chest pain. Her past medical history significant for HIV, ESRD, HTN,tobacco and cocaineabuse, obesity,right hip osteonecrosis   ESRD- hyperkalemia - admit to observation for HD tonight - hyperkalemia due to missing dialysis today.on the previous admission it's clear that she was missing dialysis sessions however she states that her compliance has been good since discharge then. - Access difficult but IV insulin, glucose, and calcium gluconate ordered and accesses being reattempted currently - EKG with only slightly peaked T waves, continue telemetry - Nephrology has already consulted-appreciate recommendations.  - HD tonight - Continue renal vitamins, Aranesp, sevelamer, calcitriol - BMP in the a.m. - BNP elevated showing evidence of volume overload, however clinically is not severe and she denies dyspnea.  Chest pain - Likely musculoskeletal - Troponin X 1 as it has onset about 24 hours ago - EKG unchanged from previous - K pad, lidoderm patch  Right hip pain - Known right hip osteonecrosis, she is to follow-up with orthopedic surgery outpatient for this. - When necessary pain medications ordered per home regimen, Norco for moderate pain and Dilaudid by mouth for severe pain  Leukocytosis - mild elevation to 10.7 - Unclear etiology, monitor for infectious process  HIV infection - Continue antiretrovirals  Hypertension - continue amlodipine, hydralazine, Imdur - Reasonable control currently with blood pressure 157/71  Anemia, macrocytic -  hemoglobin stable at 11.1 - Likely anemia of chronic renal disease, however with macrocytosis would consider folate and B-12 deficiency  FEN/GI: renal diet Prophylaxis: subcutaneous heparin Disposition: admit to nephrology telemetry for hemodialysis and monitoring Code Status: full  History of Present Illness: [name] is a 63 y.o. year old female presenting with hyperkalemia after missing hemodialysis today. Patient states that she has to go to an HD center in another county due to limited resources locally and for some reason today her transportation did not show up. It was mentioned in her previous admission that she had not been compliant with dialysis, however she states that she has been going to dialysis as prescribed since her last discharge from the hospital. She states that she is more tired feeling than usual. She denies any palpitations.   She also states that he has left chest wall and shoulder pain. She states that this started after moving some furniture last night and has not changed since then. She states that she does have intermittent hot flashes and sweating but she does not attribute this or associated with the pain. She denies any radiation of the pain and denies any nausea or vomiting associated with the pain.  She denies dysuria and states that she urinates a very small amount once daily.   Patient Active Problem List   Diagnosis Date Noted  . Hyperkalemia 07/12/2014  . Screening examination for venereal disease 05/13/2014  . Encounter for long-term (current) use of other medications 05/13/2014  . Respiratory failure 05/08/2014  . Dyspnea 04/21/2014  . Pulmonary edema 04/21/2014  . Crack cocaine use 01/17/2014  . Osteoarthritis of right hip 12/17/2013  . Extreme obesity 10/23/2013  . ESRD (end stage renal disease) on dialysis 09/30/2013  . Anemia secondary  to renal failure 02/04/2013  . Compulsive tobacco user syndrome 01/30/2013  . Hyperparathyroidism  due to renal insufficiency 01/30/2013  . Anxiety state 01/30/2013  . HIV infection   . Hypertension   . Mixed, or nondependent drug abuse, in remission 08/02/2010   Past Medical History: Past Medical History  Diagnosis Date  . Hypertension   . HIV infection   . Morbid obesity   . ESRD (end stage renal disease) on dialysis 09/30/2013    Started dialysis in [place], [place] around 2009.  ESRD due to HTN vs drug abuse according to pt.  Was on dialysis at [facility] until Feb 2015 when she was admitted to a SNF due to homelessness and drug abuse.  Then changed to [facility] on TTS schedule.     . Anginal pain   . CHF (congestive heart failure)   . Shortness of breath   . Pneumonia   . Depression   . Anxiety   . GERD (gastroesophageal reflux disease)   . Arthritis   . Anemia    Past Surgical History: Past Surgical History  Procedure Laterality Date  . Arteriovenous graft placement      left forearm  . Cardiac catheterization    . Laparotomy      states seh was cut open because seh was bleeding on the inside   Social History: History  Substance Use Topics  . Smoking status: Current Every Day Smoker -- 0.50 packs/day    Types: Cigarettes    Start date: 05/06/2014  . Smokeless tobacco: Never Used     Comment: recently quit  . Alcohol Use: No   For any additional social history documentation, please refer to relevant sections of EMR.  Family History: Family History  Problem Relation Age of Onset  . Kidney failure Other     niece  . High blood pressure    . Lung cancer    . Breast cancer Neg Hx   . Colon cancer Neg Hx   . Stroke Mother   . HIV/AIDS Brother     died of AIDS   Allergies: Allergies  Allergen Reactions  . Tramadol Other (See Comments)    "Gives me bad dreams"  . Morphine And Related Hives and Rash   No current facility-administered medications on file prior to encounter.   Current Outpatient Prescriptions on File Prior to Encounter  Medication Sig  Dispense Refill  . hydrALAZINE (APRESOLINE) 25 MG tablet Take 25 mg by mouth every 8 (eight) hours.    Marland Kitchen HYDROcodone-acetaminophen (NORCO/VICODIN) 5-325 MG per tablet Take 1-2 tablets by mouth every 4 (four) hours as needed. 10 tablet 0  . HYDROmorphone (DILAUDID) 4 MG tablet Take 0.5 tablets (2 mg total) by mouth 2 (two) times daily as needed for severe pain. 10 tablet 0  . isosorbide mononitrate (IMDUR) 30 MG 24 hr tablet Take 30 mg by mouth daily.    Marland Kitchen lamivudine (EPIVIR-HBV) 5 MG/ML solution Take 5 mLs (25 mg total) by mouth daily. 240 mL 5  . LORazepam (ATIVAN) 0.5 MG tablet Take 0.5 mg by mouth every 12 (twelve) hours as needed for anxiety.    Marland Kitchen omeprazole (PRILOSEC) 20 MG capsule Take 20 mg by mouth daily.    Marland Kitchen oxyCODONE-acetaminophen (PERCOCET/ROXICET) 5-325 MG per tablet Take 1 tablet by mouth every 8 (eight) hours as needed for severe pain.    . polyethylene glycol (MIRALAX / GLYCOLAX) packet Take 17 g by mouth daily as needed for mild constipation.    Marland Kitchen  predniSONE (DELTASONE) 5 MG tablet Take 1 tablet (5 mg total) by mouth daily. Prednisone dose pack: 30 mg on Day one (6 tabs) and two (6 tabs), 25 mg on day three (5 tabs) and four, 20 (5 tabs) mg on day five (4 tabs) and six (4 tabs), 15 mg on day seven (3 tabs) and eight (3 tabs), 10 mg on day nine (2 tabs) and ten (2 tabs), 5 mg on day eleven ( 1 tab) and twelve ( 1 tab). 42 tablet 0  . raltegravir (ISENTRESS) 400 MG tablet Take 1 tablet (400 mg total) by mouth 2 (two) times daily. 60 tablet 5  . sevelamer carbonate (RENVELA) 800 MG tablet Take 2,400 mg by mouth 3 (three) times daily with meals.    Marland Kitchen. tenofovir (VIREAD) 300 MG tablet Take 1 tablet (300 mg total) by mouth once a week. Give on Friday 5 tablet 5  . amLODipine (NORVASC) 5 MG tablet Take 10 mg by mouth at bedtime.     . calcitRIOL (ROCALTROL) 0.5 MCG capsule Take 2 mcg by mouth daily. Takes on Tuesday, Thursday, and Sat with Dialysis    . cinacalcet (SENSIPAR) 90 MG tablet  Take 90 mg by mouth daily.    . colchicine 0.6 MG tablet Take 0.6 mg by mouth daily.    . diphenhydrAMINE (BENADRYL) 25 mg capsule Take 50 mg by mouth at bedtime as needed for sleep.    Marland Kitchen. FLUoxetine (PROZAC) 20 MG capsule Take 20 mg by mouth daily.    . folic acid-vitamin b complex-vitamin c-selenium-zinc (DIALYVITE) 3 MG TABS tablet Take 1 tablet by mouth daily.     Review Of Systems: Per HPI, Otherwise 12 point review of systems was performed and was unremarkable.  Physical Exam: BP 157/71 mmHg  Pulse 99  Temp(Src) 98.7 F (37.1 C) (Oral)  Resp 22  Ht 5\' 4"  (1.626 m)  Wt 246 lb (111.585 kg)  BMI 42.21 kg/m2  SpO2 96% Exam: Gen: NAD, alert, cooperative with exam, lying in bed on her L side HEENT: NCAT, EOMI, MMM CV: RRR, good S1/S2, no murmur Resp: CTABL, no wheezes, non-labored Abd: obese Ext: No edema, warm Neuro: Alert and oriented, Strength 5/5 in bilateral lower extremities, EOMI, normal speech Psych: Normal mood and affect   Labs and Imaging: CBC BMET   Recent Labs Lab 08/10/14 1422  WBC 10.7*  HGB 11.1*  HCT 35.0*  PLT 224    Recent Labs Lab 08/10/14 1422  NA 138  K 6.8*  CL 93*  CO2 24  BUN 75*  CREATININE 12.19*  GLUCOSE 164*  CALCIUM 8.3*     Chest x-ray 08/10/2014 FINDINGS: There is borderline increased cardiac size. Tortuous aorta. Mild vascular congestion. Slight platelike atelectasis LEFT midlung zone, versus scarring. No edema or consolidation. No osseous findings. Similar appearance to priors.  IMPRESSION: Stable chest.  EKG 08/10/2014: Normal sinus rhythm, slightly more prominent T waves but not overtly peaked  Kevin FentonBradshaw, Clifford Coudriet, MD 08/10/2014, 5:32 PM

## 2014-08-10 NOTE — ED Notes (Signed)
Pt IV to right forearm infiltrated and pt is refusing to have IV placed by staff in ED. New order for IV team placed.

## 2014-08-10 NOTE — Consult Note (Signed)
Indication for Consultation:  Management of ESRD/hemodialysis; anemia, hypertension/volume and secondary hyperparathyroidism  HPI: [name] is a 63 y.o. female who presented to the ED this AM with complaints of chest pain which started last night, no other associated symptoms. She receives HD MWF @ [facility]. Missed HD this AM because [place] [facility], she was aware of this previously but has not attempted to get anything set up. She reports feeling well until about 11p, last night she began to have chest pain- but she thinks it was actually muscular because it was more of an ache in her upper chest, neck and L shoulder and was worse with movement. She did not take anything for pain. This morning she had some nausea which resolved without treatment. She was given nitro but did not notice much improvement in pain. She states she has been out of all her medications for at least 2 weeks- BP, HIV and anxiety meds, she only asked for lorazepam (which she was not given) last week at HD and did not tell anyone she was out of all other mediations. K+ 6.8 in the ED, will arrange HD today.   Past Medical History  Diagnosis Date  . Hypertension   . HIV infection   . Morbid obesity   . ESRD (end stage renal disease) on dialysis 09/30/2013    [place].  ESRD due to HTN vs drug abuse according to pt.  [facility] until Feb 2015 when she was admitted to a SNF due to homelessness and drug abuse.  Then [facility] schedule.     . Anginal pain   . CHF (congestive heart failure)   . Shortness of breath   . Pneumonia   . Depression   . Anxiety   . GERD (gastroesophageal reflux disease)   . Arthritis   . Anemia    Past Surgical History  Procedure Laterality Date  . Arteriovenous graft placement      left forearm  . Cardiac catheterization    . Laparotomy      states seh was cut open  because seh was bleeding on the inside   Family History  Problem Relation Age of Onset  . Kidney failure Other     niece  . High blood pressure    . Lung cancer    . Breast cancer Neg Hx   . Colon cancer Neg Hx   . Stroke Mother   . HIV/AIDS Brother     died of AIDS   Social History:  reports that she has been smoking Cigarettes.  She started smoking about 3 months ago. She has been smoking about 0.50 packs per day. She has never used smokeless tobacco. She reports that she does not drink alcohol or use illicit drugs. Allergies  Allergen Reactions  . Tramadol Other (See Comments)    "Gives me bad dreams"  . Morphine And Related Hives and Rash   Prior to Admission medications   Medication Sig Start Date End Date Taking? Authorizing Provider  hydrALAZINE (APRESOLINE) 25 MG tablet Take 25 mg by mouth every 8 (eight) hours.   Yes Historical Provider, MD  HYDROcodone-acetaminophen (NORCO/VICODIN) 5-325 MG per tablet Take 1-2 tablets by mouth every 4 (four) hours as needed. 08/02/14  Yes Tiffany Irine SealG Greene, PA-C  HYDROmorphone (DILAUDID) 4 MG tablet Take 0.5 tablets (2 mg total) by  mouth 2 (two) times daily as needed for severe pain. 07/04/14  Yes Tomasita CrumbleAdeleke Oni, MD  isosorbide mononitrate (IMDUR) 30 MG 24 hr tablet Take 30 mg by mouth daily.   Yes Historical Provider, MD  lamivudine (EPIVIR-HBV) 5 MG/ML solution Take 5 mLs (25 mg total) by mouth daily. 07/14/14  Yes Gardiner Barefootobert W Comer, MD  LORazepam (ATIVAN) 0.5 MG tablet Take 0.5 mg by mouth every 12 (twelve) hours as needed for anxiety.   Yes Historical Provider, MD  omeprazole (PRILOSEC) 20 MG capsule Take 20 mg by mouth daily.   Yes Historical Provider, MD  oxyCODONE-acetaminophen (PERCOCET/ROXICET) 5-325 MG per tablet Take 1 tablet by mouth every 8 (eight) hours as needed for severe pain.   Yes Historical Provider, MD  polyethylene glycol (MIRALAX / GLYCOLAX) packet Take 17 g by mouth daily as needed for mild constipation.   Yes Historical  Provider, MD  predniSONE (DELTASONE) 5 MG tablet Take 1 tablet (5 mg total) by mouth daily. Prednisone dose pack: 30 mg on Day one (6 tabs) and two (6 tabs), 25 mg on day three (5 tabs) and four, 20 (5 tabs) mg on day five (4 tabs) and six (4 tabs), 15 mg on day seven (3 tabs) and eight (3 tabs), 10 mg on day nine (2 tabs) and ten (2 tabs), 5 mg on day eleven ( 1 tab) and twelve ( 1 tab). 08/02/14  Yes Tiffany Irine SealG Greene, PA-C  raltegravir (ISENTRESS) 400 MG tablet Take 1 tablet (400 mg total) by mouth 2 (two) times daily. 07/14/14  Yes Gardiner Barefootobert W Comer, MD  sevelamer carbonate (RENVELA) 800 MG tablet Take 2,400 mg by mouth 3 (three) times daily with meals.   Yes Historical Provider, MD  tenofovir (VIREAD) 300 MG tablet Take 1 tablet (300 mg total) by mouth once a week. Give on Friday 07/14/14  Yes Gardiner Barefootobert W Comer, MD  amLODipine (NORVASC) 5 MG tablet Take 10 mg by mouth at bedtime.     Historical Provider, MD  calcitRIOL (ROCALTROL) 0.5 MCG capsule Take 2 mcg by mouth daily. Takes on Tuesday, Thursday, and Sat with Dialysis    Historical Provider, MD  cinacalcet (SENSIPAR) 90 MG tablet Take 90 mg by mouth daily.    Historical Provider, MD  colchicine 0.6 MG tablet Take 0.6 mg by mouth daily.    Historical Provider, MD  diphenhydrAMINE (BENADRYL) 25 mg capsule Take 50 mg by mouth at bedtime as needed for sleep.    Historical Provider, MD  FLUoxetine (PROZAC) 20 MG capsule Take 20 mg by mouth daily.    Historical Provider, MD  folic acid-vitamin b complex-vitamin c-selenium-zinc (DIALYVITE) 3 MG TABS tablet Take 1 tablet by mouth daily.    Historical Provider, MD   Current Facility-Administered Medications  Medication Dose Route Frequency Provider Last Rate Last Dose  . calcium chloride 1 g in sodium chloride 0.9 % 100 mL IVPB  1 g Intravenous Once [employer]Kaitlyn Szekalski, PA-C      . dextrose 10 % infusion   Intravenous Once [employer]Kaitlyn Szekalski, PA-C      . insulin aspart (novoLOG) injection 10 Units  10 Units  Intravenous Once Emilia BeckKaitlyn Szekalski, PA-C       Current Outpatient Prescriptions  Medication Sig Dispense Refill  . hydrALAZINE (APRESOLINE) 25 MG tablet Take 25 mg by mouth every 8 (eight) hours.    Marland Kitchen. HYDROcodone-acetaminophen (NORCO/VICODIN) 5-325 MG per tablet Take 1-2 tablets by mouth every 4 (four) hours as needed. 10 tablet 0  . HYDROmorphone (  DILAUDID) 4 MG tablet Take 0.5 tablets (2 mg total) by mouth 2 (two) times daily as needed for severe pain. 10 tablet 0  . isosorbide mononitrate (IMDUR) 30 MG 24 hr tablet Take 30 mg by mouth daily.    Marland Kitchen lamivudine (EPIVIR-HBV) 5 MG/ML solution Take 5 mLs (25 mg total) by mouth daily. 240 mL 5  . LORazepam (ATIVAN) 0.5 MG tablet Take 0.5 mg by mouth every 12 (twelve) hours as needed for anxiety.    Marland Kitchen omeprazole (PRILOSEC) 20 MG capsule Take 20 mg by mouth daily.    Marland Kitchen oxyCODONE-acetaminophen (PERCOCET/ROXICET) 5-325 MG per tablet Take 1 tablet by mouth every 8 (eight) hours as needed for severe pain.    . polyethylene glycol (MIRALAX / GLYCOLAX) packet Take 17 g by mouth daily as needed for mild constipation.    . predniSONE (DELTASONE) 5 MG tablet Take 1 tablet (5 mg total) by mouth daily. Prednisone dose pack: 30 mg on Day one (6 tabs) and two (6 tabs), 25 mg on day three (5 tabs) and four, 20 (5 tabs) mg on day five (4 tabs) and six (4 tabs), 15 mg on day seven (3 tabs) and eight (3 tabs), 10 mg on day nine (2 tabs) and ten (2 tabs), 5 mg on day eleven ( 1 tab) and twelve ( 1 tab). 42 tablet 0  . raltegravir (ISENTRESS) 400 MG tablet Take 1 tablet (400 mg total) by mouth 2 (two) times daily. 60 tablet 5  . sevelamer carbonate (RENVELA) 800 MG tablet Take 2,400 mg by mouth 3 (three) times daily with meals.    Marland Kitchen tenofovir (VIREAD) 300 MG tablet Take 1 tablet (300 mg total) by mouth once a week. Give on Friday 5 tablet 5  . amLODipine (NORVASC) 5 MG tablet Take 10 mg by mouth at bedtime.     . calcitRIOL (ROCALTROL) 0.5 MCG capsule Take 2 mcg by mouth  daily. Takes on Tuesday, Thursday, and Sat with Dialysis    . cinacalcet (SENSIPAR) 90 MG tablet Take 90 mg by mouth daily.    . colchicine 0.6 MG tablet Take 0.6 mg by mouth daily.    . diphenhydrAMINE (BENADRYL) 25 mg capsule Take 50 mg by mouth at bedtime as needed for sleep.    Marland Kitchen FLUoxetine (PROZAC) 20 MG capsule Take 20 mg by mouth daily.    . folic acid-vitamin b complex-vitamin c-selenium-zinc (DIALYVITE) 3 MG TABS tablet Take 1 tablet by mouth daily.     Labs: Basic Metabolic Panel:  Recent Labs Lab 08/10/14 1422  NA 138  K 6.8*  CL 93*  CO2 24  GLUCOSE 164*  BUN 75*  CREATININE 12.19*  CALCIUM 8.3*   Liver Function Tests: No results for input(s): AST, ALT, ALKPHOS, BILITOT, PROT, ALBUMIN in the last 168 hours. No results for input(s): LIPASE, AMYLASE in the last 168 hours. No results for input(s): AMMONIA in the last 168 hours. CBC:  Recent Labs Lab 08/10/14 1422  WBC 10.7*  HGB 11.1*  HCT 35.0*  MCV 108.4*  PLT 224   Cardiac Enzymes: No results for input(s): CKTOTAL, CKMB, CKMBINDEX, TROPONINI in the last 168 hours. CBG: No results for input(s): GLUCAP in the last 168 hours. Iron Studies: No results for input(s): IRON, TIBC, TRANSFERRIN, FERRITIN in the last 72 hours. Studies/Results: Dg Chest 2 View  08/10/2014   CLINICAL DATA:  Left-sided chest pain and shortness of breath.  EXAM: CHEST  2 VIEW  COMPARISON:  07/2009 15.  FINDINGS: There  is borderline increased cardiac size. Tortuous aorta. Mild vascular congestion. Slight platelike atelectasis LEFT midlung zone, versus scarring. No edema or consolidation. No osseous findings. Similar appearance to priors.  IMPRESSION: Stable chest.   Electronically Signed   By: Davonna Belling M.D.   On: 08/10/2014 14:12   Review of Systems: Gen: Reports difficulty sleeping. Denies any fever, chills, sweats, anorexia, fatigue, weakness, malaise, weight loss.  HEENT: No visual complaints, No history of Retinopathy. Normal  external appearance No Epistaxis or Sore throat. No sinusitis .   CV: Reports chest pain, described as ache and chronic trace LE edema. Denies palpitations, syncope, orthopnea, PND, and claudication. Resp: Denies dyspnea at rest, dyspnea with exercise, cough, sputum, wheezing, coughing up blood, and pleurisy. GI: Reports nausea this am, no resolved. Denies vomiting blood, jaundice, and fecal incontinence.   Denies dysphagia or odynophagia. GU : dysuria  MS: Reports chronic R hip and leg pain Derm: Denies rash, itching, dry skin, hives, moles, warts, or unhealing ulcers.  Psych: Reports anxiety.  Heme: Denies bruising, bleeding, and enlarged lymph nodes. Neuro: No headache.  No diplopia. No dysarthria.  No dysphasia.  No history of CVA.  No Seizures. No paresthesias.  No weakness. Endocrine  No Thyroid disease.  No Adrenal disease.  Physical Exam: Filed Vitals:   08/10/14 1205 08/10/14 1230 08/10/14 1330 08/10/14 1500  BP: 188/107 171/97 173/94 157/71  Pulse: 94 97 99   Temp: 98.7 F (37.1 C)     TempSrc: Oral     Resp: 17 19 18 22   Height: 5\' 4"  (1.626 m)     Weight: 111.585 kg (246 lb)     SpO2: 100% 99% 96%      General: Well developed, well nourished, in no acute distress. Head: Normocephalic, atraumatic, sclera non-icteric, mucus membranes are moist Neck: Supple. JVD not elevated. No carotid bruits Lungs: Clear, dim bases bilaterally. without wheezes, rales, or rhonchi. Breathing is unlabored. Heart: RRR with S1 S2. No murmurs, rubs, or gallops appreciated. Abdomen: Soft, obese, non-tender, non-distended with normoactive bowel sounds. No rebound/guarding. No obvious abdominal masses. M-S:  Strength and tone appear normal for age. Lower extremities: mild tenderness, trace LE edema or ischemic changes, no open wounds  Neuro: Alert and oriented X 3. Moves all extremities spontaneously. Psych:  Responds to questions appropriately with a normal affect. Dialysis Access: L AVF  +b/t  Dialysis Orders:  MWF @ [facility]  4 hr  109kgs  2K/2.25ca 9800 Heparin  L AVF.   400/800 Calcitriol 2.25 mcg Aranesp 25q qweek Venofe 50 q week   Assessment/Plan: 1.  Chest pain- per primary. EKG SR.  2. Dysuria- check UA 3.  ESRD -  MWF [facility]. Is going to need a new outpt center as she can no longer transportation to [place] from [place]. K+ 6.8- HD pending for hyperkalemia and full tx tomorrow. History of noncomplaince.  4.  Hypertension/volume  -188/107  2.5 Kgs over edw. Is not taking any BP meds- norvasc. Mild vascular congestion on chest xray.  5.  Anemia  - hgb 11.1- cont aranesp 25 and Fe q week.  6.  Metabolic bone disease -  Ca+ 8.3, cont calcitriol. Has not been taking sensipar but last PTH 788, phos 5.9-continue sensipar and renvela 7.  Nutrition - renavit. Last alb 4. Will need renal diet. 8. Anxiety- per primary, has been on lorazepam in the past.  9. HIV- per primary, not currently on meds 10. dispo- per notes medical director in [place]  said he will never take pt back to that unit. She cannot get transportation to [place].   Jetty Duhamel, NP [facility] [phone] 08/10/2014, 4:09 PM   Pt seen, examined and agree w A/P as above. Vinson Moselle MD pager [phone]    cell [phone] 08/10/2014, 6:01 PM

## 2014-08-10 NOTE — Progress Notes (Signed)
Called ED RN for report. Vanda Waskey Joselita, RN 

## 2014-08-10 NOTE — Progress Notes (Signed)
New Admission Note:   Arrival Method: via stretcher from ED Mental Orientation: alert and oriented x4 Telemetry: box #1,1st deg AVblock Assessment: Completed Skin: intact IV: rt UA Pain:  denies Tubes: N/A Safety Measures: Safety Fall Prevention Plan has been given, discusssed.                                                                      [address] [place] Orientation: Patient has been orientated to the room, unit and staff.    Orders have been reviewed and implemented. Will continue to monitor the patient. Call light has been placed within reach and bed alarm has been activated.   Toll BrothersCharito Laycie Schriner BSN, RN-BC Phone number: [phone]

## 2014-08-10 NOTE — ED Provider Notes (Signed)
CSN: [id number]     Arrival date & time 08/10/14  1147 History   First MD Initiated Contact with Patient 08/10/14 1203     Chief Complaint  Patient presents with  . Chest Pain     (Consider location/radiation/quality/duration/timing/severity/associated sxs/prior Treatment) HPI Comments: Patient is a 63 year old female with a past medical history of HIV infection, hypertension, ESRD on dialysis MWF, obesity, and anxiety who presents with chest pain that started last night. Patient reports sudden onset of chest pain that is described as aching and severe in her left shoulder that radiates to her right breast. The pain lasts a few seconds and then spontaneously resolves. No associated symptoms. No aggravating/alleviating factors. Patient reports she was supposed to go to dialysis today but her ride never came to pick her up.    Past Medical History  Diagnosis Date  . Hypertension   . HIV infection   . Morbid obesity   . ESRD (end stage renal disease) on dialysis 09/30/2013    Started dialysis in [place], [place] around 2009.  ESRD due to HTN vs drug abuse according to pt.  Was on dialysis at [facility] until Feb 2015 when she was admitted to a SNF due to homelessness and drug abuse.  Then changed to [facility] on TTS schedule.     . Anginal pain   . CHF (congestive heart failure)   . Shortness of breath   . Pneumonia   . Depression   . Anxiety   . GERD (gastroesophageal reflux disease)   . Arthritis   . Anemia    Past Surgical History  Procedure Laterality Date  . Arteriovenous graft placement      left forearm  . Cardiac catheterization    . Laparotomy      states seh was cut open because seh was bleeding on the inside   Family History  Problem Relation Age of Onset  . Kidney failure Other     niece  . High blood pressure    . Lung cancer    . Breast cancer Neg Hx   . Colon cancer Neg Hx   . Stroke Mother   . HIV/AIDS Brother     died of AIDS   History  Substance Use  Topics  . Smoking status: Current Every Day Smoker -- 0.50 packs/day    Types: Cigarettes    Start date: 05/06/2014  . Smokeless tobacco: Never Used     Comment: recently quit  . Alcohol Use: No   OB History    No data available     Review of Systems  Constitutional: Negative for fever, chills and fatigue.  HENT: Negative for trouble swallowing.   Eyes: Negative for visual disturbance.  Respiratory: Negative for shortness of breath.   Cardiovascular: Positive for chest pain. Negative for palpitations.  Gastrointestinal: Negative for nausea, vomiting, abdominal pain and diarrhea.  Genitourinary: Negative for dysuria and difficulty urinating.  Musculoskeletal: Negative for arthralgias and neck pain.  Skin: Negative for color change.  Neurological: Negative for dizziness and weakness.  Psychiatric/Behavioral: Negative for dysphoric mood.      Allergies  Tramadol and Morphine and related  Home Medications   Prior to Admission medications   Medication Sig Start Date End Date Taking? Authorizing Provider  amLODipine (NORVASC) 5 MG tablet Take 10 mg by mouth at bedtime.     Historical Provider, MD  calcitRIOL (ROCALTROL) 0.5 MCG capsule Take 2 mcg by mouth daily. Takes on Tuesday, Thursday,  and Sat with Dialysis    Historical Provider, MD  cinacalcet (SENSIPAR) 90 MG tablet Take 90 mg by mouth daily.    Historical Provider, MD  colchicine 0.6 MG tablet Take 0.6 mg by mouth daily.    Historical Provider, MD  diphenhydrAMINE (BENADRYL) 25 mg capsule Take 50 mg by mouth at bedtime as needed for sleep.    Historical Provider, MD  FLUoxetine (PROZAC) 20 MG capsule Take 20 mg by mouth daily.    Historical Provider, MD  folic acid-vitamin b complex-vitamin c-selenium-zinc (DIALYVITE) 3 MG TABS tablet Take 1 tablet by mouth daily.    Historical Provider, MD  hydrALAZINE (APRESOLINE) 25 MG tablet Take 25 mg by mouth every 8 (eight) hours.    Historical Provider, MD   HYDROcodone-acetaminophen (NORCO/VICODIN) 5-325 MG per tablet Take 1-2 tablets by mouth every 4 (four) hours as needed. 08/02/14   Tiffany Irine SealG Greene, PA-C  HYDROmorphone (DILAUDID) 4 MG tablet Take 0.5 tablets (2 mg total) by mouth 2 (two) times daily as needed for severe pain. 07/04/14   Tomasita CrumbleAdeleke Oni, MD  isosorbide mononitrate (IMDUR) 30 MG 24 hr tablet Take 30 mg by mouth daily.    Historical Provider, MD  lamivudine (EPIVIR-HBV) 5 MG/ML solution Take 5 mLs (25 mg total) by mouth daily. 07/14/14   Gardiner Barefootobert W Comer, MD  LORazepam (ATIVAN) 0.5 MG tablet Take 0.5 mg by mouth every 12 (twelve) hours as needed for anxiety.    Historical Provider, MD  omeprazole (PRILOSEC) 20 MG capsule Take 20 mg by mouth daily.    Historical Provider, MD  oxyCODONE-acetaminophen (PERCOCET/ROXICET) 5-325 MG per tablet Take 1 tablet by mouth every 8 (eight) hours as needed for severe pain.    Historical Provider, MD  polyethylene glycol (MIRALAX / GLYCOLAX) packet Take 17 g by mouth daily as needed for mild constipation.    Historical Provider, MD  predniSONE (DELTASONE) 5 MG tablet Take 1 tablet (5 mg total) by mouth daily. Prednisone dose pack: 30 mg on Day one (6 tabs) and two (6 tabs), 25 mg on day three (5 tabs) and four, 20 (5 tabs) mg on day five (4 tabs) and six (4 tabs), 15 mg on day seven (3 tabs) and eight (3 tabs), 10 mg on day nine (2 tabs) and ten (2 tabs), 5 mg on day eleven ( 1 tab) and twelve ( 1 tab). 08/02/14   Tiffany Irine SealG Greene, PA-C  raltegravir (ISENTRESS) 400 MG tablet Take 1 tablet (400 mg total) by mouth 2 (two) times daily. 07/14/14   Gardiner Barefootobert W Comer, MD  sevelamer carbonate (RENVELA) 800 MG tablet Take 2,400 mg by mouth 3 (three) times daily with meals.    Historical Provider, MD  tenofovir (VIREAD) 300 MG tablet Take 1 tablet (300 mg total) by mouth once a week. Give on Friday 07/14/14   Gardiner Barefootobert W Comer, MD   BP 188/107 mmHg  Pulse 94  Temp(Src) 98.7 F (37.1 C) (Oral)  Resp 17  Ht 5\' 4"  (1.626 m)   Wt 246 lb (111.585 kg)  BMI 42.21 kg/m2  SpO2 100% Physical Exam  Constitutional: She is oriented to person, place, and time. She appears well-developed and well-nourished. No distress.  HENT:  Head: Normocephalic and atraumatic.  Eyes: Conjunctivae and EOM are normal.  Neck: Normal range of motion.  Cardiovascular: Normal rate and regular rhythm.  Exam reveals no gallop and no friction rub.   No murmur heard. Pulmonary/Chest: Effort normal and breath sounds normal. She has no wheezes.  She has no rales. She exhibits no tenderness.  Abdominal: Soft. She exhibits no distension. There is no tenderness. There is no rebound.  Musculoskeletal: Normal range of motion.  Neurological: She is alert and oriented to person, place, and time. Coordination normal.  Speech is goal-oriented. Moves limbs without ataxia.   Skin: Skin is warm and dry.  Psychiatric: She has a normal mood and affect. Her behavior is normal.  Nursing note and vitals reviewed.   ED Course  Procedures (including critical care time) Labs Review Labs Reviewed  CBC - Abnormal; Notable for the following:    WBC 10.7 (*)    RBC 3.23 (*)    Hemoglobin 11.1 (*)    HCT 35.0 (*)    MCV 108.4 (*)    MCH 34.4 (*)    All other components within normal limits  BASIC METABOLIC PANEL - Abnormal; Notable for the following:    Potassium 6.8 (*)    Chloride 93 (*)    Glucose, Bld 164 (*)    BUN 75 (*)    Creatinine, Ser 12.19 (*)    Calcium 8.3 (*)    GFR calc non Af Amer 3 (*)    GFR calc Af Amer 3 (*)    Anion gap 21 (*)    All other components within normal limits  PRO B NATRIURETIC PEPTIDE - Abnormal; Notable for the following:    Pro B Natriuretic peptide (BNP) 16099.0 (*)    All other components within normal limits  I-STAT TROPOININ, ED   CRITICAL CARE Performed by: Emilia BeckKaitlyn Jabaree Mercado   Total critical care time: 30 minutes  Critical care time was exclusive of separately billable procedures and treating other  patients.  Critical care was necessary to treat or prevent imminent or life-threatening deterioration.  Critical care was time spent personally by me on the following activities: development of treatment plan with patient and/or surrogate as well as nursing, discussions with consultants, evaluation of patient's response to treatment, examination of patient, obtaining history from patient or surrogate, ordering and performing treatments and interventions, ordering and review of laboratory studies, ordering and review of radiographic studies, pulse oximetry and re-evaluation of patient's condition.   Imaging Review Dg Chest 2 View  08/10/2014   CLINICAL DATA:  Left-sided chest pain and shortness of breath.  EXAM: CHEST  2 VIEW  COMPARISON:  07/2009 15.  FINDINGS: There is borderline increased cardiac size. Tortuous aorta. Mild vascular congestion. Slight platelike atelectasis LEFT midlung zone, versus scarring. No edema or consolidation. No osseous findings. Similar appearance to priors.  IMPRESSION: Stable chest.   Electronically Signed   By: Davonna BellingJohn  Curnes M.D.   On: 08/10/2014 14:12     EKG Interpretation   Date/Time:  Monday August 10 2014 12:01:25 EST Ventricular Rate:  93 PR Interval:  194 QRS Duration: 107 QT Interval:  373 QTC Calculation: 464 R Axis:   36 Text Interpretation:  Sinus rhythm No significant change since last  tracing Confirmed by Rubin PayorPICKERING  MD, Harrold DonathNATHAN [phone] on 08/10/2014 3:35:31 PM      MDM   Final diagnoses:  Chest pain  Hyperkalemia    12:26 PM Labs and chest xray pending.   3:51 PM Labs shows hyperkalemia at 6.8 with mildly peaked T waves on EKG. I spoke with Dr. Arlean HoppingSchertz who recommends IV calcium and insulin. Patient will be a medicine admission.   Emilia BeckKaitlyn Waver Dibiasio, [place] JerseyPA-C 08/11/14 [phone]

## 2014-08-10 NOTE — ED Notes (Signed)
RN attempt with ultrasound for IV. PT unable to tolerate IV, telling RN to stop. IV removed. [facility]

## 2014-08-11 ENCOUNTER — Encounter (HOSPITAL_COMMUNITY): Payer: Self-pay | Admitting: General Practice

## 2014-08-11 DIAGNOSIS — N186 End stage renal disease: Secondary | ICD-10-CM | POA: Diagnosis present

## 2014-08-11 DIAGNOSIS — I5043 Acute on chronic combined systolic (congestive) and diastolic (congestive) heart failure: Secondary | ICD-10-CM | POA: Diagnosis present

## 2014-08-11 DIAGNOSIS — Z830 Family history of human immunodeficiency virus [HIV] disease: Secondary | ICD-10-CM | POA: Diagnosis not present

## 2014-08-11 DIAGNOSIS — B20 Human immunodeficiency virus [HIV] disease: Secondary | ICD-10-CM

## 2014-08-11 DIAGNOSIS — F17210 Nicotine dependence, cigarettes, uncomplicated: Secondary | ICD-10-CM | POA: Diagnosis present

## 2014-08-11 DIAGNOSIS — F411 Generalized anxiety disorder: Secondary | ICD-10-CM | POA: Diagnosis present

## 2014-08-11 DIAGNOSIS — E875 Hyperkalemia: Principal | ICD-10-CM

## 2014-08-11 DIAGNOSIS — K5900 Constipation, unspecified: Secondary | ICD-10-CM | POA: Diagnosis not present

## 2014-08-11 DIAGNOSIS — Z7952 Long term (current) use of systemic steroids: Secondary | ICD-10-CM | POA: Diagnosis not present

## 2014-08-11 DIAGNOSIS — M1611 Unilateral primary osteoarthritis, right hip: Secondary | ICD-10-CM | POA: Diagnosis present

## 2014-08-11 DIAGNOSIS — F1410 Cocaine abuse, uncomplicated: Secondary | ICD-10-CM | POA: Diagnosis present

## 2014-08-11 DIAGNOSIS — I120 Hypertensive chronic kidney disease with stage 5 chronic kidney disease or end stage renal disease: Secondary | ICD-10-CM | POA: Diagnosis present

## 2014-08-11 DIAGNOSIS — Z992 Dependence on renal dialysis: Secondary | ICD-10-CM | POA: Diagnosis not present

## 2014-08-11 DIAGNOSIS — K219 Gastro-esophageal reflux disease without esophagitis: Secondary | ICD-10-CM | POA: Diagnosis present

## 2014-08-11 DIAGNOSIS — Z823 Family history of stroke: Secondary | ICD-10-CM | POA: Diagnosis not present

## 2014-08-11 DIAGNOSIS — Z6841 Body Mass Index (BMI) 40.0 and over, adult: Secondary | ICD-10-CM | POA: Diagnosis not present

## 2014-08-11 DIAGNOSIS — I959 Hypotension, unspecified: Secondary | ICD-10-CM | POA: Diagnosis not present

## 2014-08-11 DIAGNOSIS — D631 Anemia in chronic kidney disease: Secondary | ICD-10-CM | POA: Diagnosis present

## 2014-08-11 DIAGNOSIS — M879 Osteonecrosis, unspecified: Secondary | ICD-10-CM | POA: Diagnosis present

## 2014-08-11 DIAGNOSIS — D72829 Elevated white blood cell count, unspecified: Secondary | ICD-10-CM | POA: Diagnosis present

## 2014-08-11 DIAGNOSIS — Z21 Asymptomatic human immunodeficiency virus [HIV] infection status: Secondary | ICD-10-CM | POA: Diagnosis present

## 2014-08-11 DIAGNOSIS — F329 Major depressive disorder, single episode, unspecified: Secondary | ICD-10-CM | POA: Diagnosis present

## 2014-08-11 DIAGNOSIS — N2581 Secondary hyperparathyroidism of renal origin: Secondary | ICD-10-CM | POA: Diagnosis present

## 2014-08-11 DIAGNOSIS — F419 Anxiety disorder, unspecified: Secondary | ICD-10-CM | POA: Diagnosis present

## 2014-08-11 HISTORY — DX: Hyperkalemia: E87.5

## 2014-08-11 LAB — BASIC METABOLIC PANEL
Anion gap: 22 — ABNORMAL HIGH (ref 5–15)
Anion gap: 18 — ABNORMAL HIGH (ref 5–15)
BUN: 82 mg/dL — ABNORMAL HIGH (ref 6–23)
BUN: 35 mg/dL — AB (ref 6–23)
CALCIUM: 8.2 mg/dL — AB (ref 8.4–10.5)
CHLORIDE: 94 meq/L — AB (ref 96–112)
CO2: 23 mEq/L (ref 19–32)
CO2: 26 meq/L (ref 19–32)
CREATININE: 7.33 mg/dL — AB (ref 0.50–1.10)
Calcium: 8.2 mg/dL — ABNORMAL LOW (ref 8.4–10.5)
Chloride: 93 mEq/L — ABNORMAL LOW (ref 96–112)
Creatinine, Ser: 12.51 mg/dL — ABNORMAL HIGH (ref 0.50–1.10)
GFR calc Af Amer: 6 mL/min — ABNORMAL LOW (ref >90)
GFR calc non Af Amer: 5 mL/min — ABNORMAL LOW (ref >90)
GFR, EST AFRICAN AMERICAN: 3 mL/min — AB (ref >90)
GFR, EST NON AFRICAN AMERICAN: 3 mL/min — AB (ref >90)
Glucose, Bld: 80 mg/dL (ref 70–99)
Glucose, Bld: 107 mg/dL — ABNORMAL HIGH (ref 70–99)
POTASSIUM: 6.7 meq/L — AB (ref 3.7–5.3)
Potassium: 5.3 mEq/L (ref 3.7–5.3)
Sodium: 138 mEq/L (ref 137–147)
Sodium: 138 mEq/L (ref 137–147)

## 2014-08-11 LAB — TROPONIN I: Troponin I: <0.3 ng/mL (ref <0.30)

## 2014-08-11 LAB — CBC
HCT: 34 % — ABNORMAL LOW (ref 36.0–46.0)
Hemoglobin: 11 g/dL — ABNORMAL LOW (ref 12.0–15.0)
MCH: 34.1 pg — ABNORMAL HIGH (ref 26.0–34.0)
MCHC: 32.4 g/dL (ref 30.0–36.0)
MCV: 105.3 fL — AB (ref 78.0–100.0)
PLATELETS: 233 10*3/uL (ref 150–400)
RBC: 3.23 MIL/uL — ABNORMAL LOW (ref 3.87–5.11)
RDW: 14.9 % (ref 11.5–15.5)
WBC: 13.8 10*3/uL — ABNORMAL HIGH (ref 4.0–10.5)

## 2014-08-11 MED ORDER — HEPARIN SODIUM (PORCINE) 1000 UNIT/ML DIALYSIS
9800.0000 [IU] | Freq: Once | INTRAMUSCULAR | Status: DC
  Filled 2014-08-11: qty 10

## 2014-08-11 MED ORDER — LIDOCAINE-PRILOCAINE 2.5-2.5 % EX CREA: 1.0000 "application " | TOPICAL_CREAM | CUTANEOUS | Status: DC | PRN

## 2014-08-11 MED ORDER — SODIUM CHLORIDE 0.9 % IV SOLN: 100.0000 mL | INTRAVENOUS | Status: DC | PRN

## 2014-08-11 MED ORDER — COLCHICINE 0.6 MG PO TABS
0.3000 mg | ORAL_TABLET | ORAL | Status: DC
  Administered 2014-08-13 – 2014-08-20 (×3): 0.3 mg via ORAL
  Filled 2014-08-11 (×5): qty 0.5

## 2014-08-11 MED ORDER — PENTAFLUOROPROP-TETRAFLUOROETH EX AERO: 1.0000 "application " | INHALATION_SPRAY | CUTANEOUS | Status: DC | PRN

## 2014-08-11 MED ORDER — HYDROCODONE-ACETAMINOPHEN 5-325 MG PO TABS
ORAL_TABLET | ORAL | Status: AC
  Filled 2014-08-11: qty 2

## 2014-08-11 MED ORDER — COLCHICINE 0.6 MG PO TABS: 0.3000 mg | ORAL_TABLET | Freq: Every day | ORAL | Status: DC

## 2014-08-11 MED ORDER — LIDOCAINE HCL (PF) 1 % IJ SOLN: 5.0000 mL | INTRAMUSCULAR | Status: DC | PRN

## 2014-08-11 MED ORDER — HEPARIN SODIUM (PORCINE) 1000 UNIT/ML DIALYSIS
9800.0000 [IU] | Freq: Once | INTRAMUSCULAR | Status: AC
  Administered 2014-08-11: 9800 [IU] via INTRAVENOUS_CENTRAL
  Filled 2014-08-11: qty 10

## 2014-08-11 MED ORDER — NEPRO/CARBSTEADY PO LIQD: 237.0000 mL | ORAL | Status: DC | PRN

## 2014-08-11 MED ORDER — LAMIVUDINE 10 MG/ML PO SOLN
25.0000 mg | Freq: Every day | ORAL | Status: DC
  Administered 2014-08-13 – 2014-08-25 (×12): 25 mg via ORAL
  Filled 2014-08-11 (×14): qty 5

## 2014-08-11 MED ORDER — HEPARIN SODIUM (PORCINE) 1000 UNIT/ML DIALYSIS
1000.0000 [IU] | INTRAMUSCULAR | Status: DC | PRN
  Filled 2014-08-11: qty 1

## 2014-08-11 MED ORDER — ALTEPLASE 2 MG IJ SOLR: 2.0000 mg | Freq: Once | INTRAMUSCULAR | Status: DC | PRN

## 2014-08-11 MED ORDER — HEPARIN SODIUM (PORCINE) 1000 UNIT/ML DIALYSIS: 9800.0000 [IU] | Freq: Once | INTRAMUSCULAR | Status: DC

## 2014-08-11 MED ORDER — HEPARIN SODIUM (PORCINE) 1000 UNIT/ML DIALYSIS: 1000.0000 [IU] | INTRAMUSCULAR | Status: DC | PRN

## 2014-08-11 MED ORDER — TENOFOVIR DISOPROXIL FUMARATE 300 MG PO TABS
300.0000 mg | ORAL_TABLET | ORAL | Status: DC
  Administered 2014-08-14 – 2014-08-21 (×2): 300 mg via ORAL
  Filled 2014-08-11 (×2): qty 1

## 2014-08-11 MED ORDER — ALTEPLASE 2 MG IJ SOLR
2.0000 mg | Freq: Once | INTRAMUSCULAR | Status: DC | PRN
  Filled 2014-08-11: qty 2

## 2014-08-11 MED ORDER — OXYCODONE-ACETAMINOPHEN 5-325 MG PO TABS
1.0000 | ORAL_TABLET | ORAL | Status: DC | PRN
  Administered 2014-08-11 – 2014-08-25 (×23): 1 via ORAL
  Filled 2014-08-11 (×21): qty 1

## 2014-08-11 MED ORDER — NEPRO/CARBSTEADY PO LIQD
237.0000 mL | ORAL | Status: DC | PRN
  Filled 2014-08-11: qty 237

## 2014-08-11 MED ORDER — OXYCODONE-ACETAMINOPHEN 5-325 MG PO TABS
ORAL_TABLET | ORAL | Status: AC
  Filled 2014-08-11: qty 1

## 2014-08-11 MED ORDER — RALTEGRAVIR POTASSIUM 400 MG PO TABS
400.0000 mg | ORAL_TABLET | Freq: Two times a day (BID) | ORAL | Status: DC
  Administered 2014-08-11 – 2014-08-25 (×25): 400 mg via ORAL
  Filled 2014-08-11 (×30): qty 1

## 2014-08-11 NOTE — Discharge Summary (Signed)
Family Medicine Teaching Service [facility] Discharge Summary  Patient name: [name] Medical record number: [id number] Date of birth: 01/10/1951 Age: 63 y.o. Gender: female Date of Admission: 08/10/2014  Date of Discharge:08/25/2014  Admitting Physician: Nestor Ramp, MD  Primary Care Provider: No PCP Per Patient Consultants: Nephrology  Indication for Hospitalization: Hyperkalemia  Discharge Diagnoses/Problem List:  Hyperkalemia - resolved ESRD Chest pain, likely MSK- resolved Right hip pain Leukocytosis - resolved HIV HTN Macrocytic anemia Acute on chronic CHF combined diastolic and systolic 2/2 HTN/ESRD/intermittent substance abuse Constipation  Disposition: SNF  Discharge Condition: Stable  Discharge Exam:  Filed Vitals:   08/25/14 1031  BP: 138/59  Pulse: 108  Temp: 98.4 F (36.9 C)  Resp: 18    Gen: NAD, alert, cooperative with exam, lying in bed HEENT: NCAT CV: RRR, good S1/S2, no murmur, 1+ B DP pulses Resp: CTABL, no wheezes, non-labored Abd: obese, NT, no organomegaly Ext: No edema, warm, no calf tenderness Neuro: Alert and oriented, EOMI, normal speech Psych: Normal mood and affect  Brief Hospital Course:   [name] is a 63 y.o. year old female presenting with Hyperkalemia after missing dialysis and chest pain. Her past medical history significant for HIV, ESRD, HTN,tobacco and cocaineabuse, obesity,right hip osteonecrosis  Hyperkalemia (resolved) in setting of ESRD and missed HD: K 6.8 on admission.  EKG on admission with only slightly peaked T waves.  On the previous admission it's clear that she was missing dialysis sessions however she states that her compliance has been good since discharge.  Unable to make it to HD on day of admission 2/2 [facility].  Nephrology consulted on admission, and took patient for HD O/N on 11/2.  Patient monitored on telemetry throughout admission.  Hyperkalemia resovled s/p HD.   HD continued on regular HD days (M/W/F) throughout remainder of admission.  Home renal vitamins, Aranesp, sevelamer, and calcitriol continued throughout admission.  Patient stable with normal potassium at time of discharge.   Patient had significant disposition issues that delayed her discharge, including difficulty finding dialysis centers willing to accept the patient, and finding transportation to the dialysis centers. Please see social work notes for further details. On the day of discharge patient had seat at [facility] for hemodialysis, and was discharged to live with her daughter in [place]. Patient will be responsible for securing her own transportation to these hemodialysis sessions.   Chest pain: Patient reporting L chest and shoulder pain on admission. Onset after moving furniture nearly 24 hours prior to admission.Troponin neg X 2. EKG unchanged from previous and non-ischemic.  Thought to be likely MSK in origin, as reproducible on exam.  Pain resolved with K pad and lidoderm patch.  Right hip pain: Patient with known right hip osteonecrosis, and intended to follow-up with orthopedic surgery outpatient for this.  Pain control with home Percocet and PO Dilaudid.  Home Norco held, as it seems to be duplicate therapy.  Pain well controlled throughout admission.  Leukocytosis: WBC 10.7 on admission and elevated to 13.8 on repeat labs.  No signs of infection, afebrile, VSS. Unclear etiology, but resolved without intervention to prior to discharge.  HIV infection: Patient has been without antiretrovirals for ~2-3 weeks prior to admission, per patient report. ID consulted and recommended continuing home antiretrovirals, sent Ultra HIV quant and genotype. Patient to follow-up as outpatient in [facility] in 3-4 wks for repeat VL and CD4.  Acute on chronic CHF combined systolic and diastolic 2/2 HTN/ESRD/intermittent  substance abuse: BNP 16099 on admission.  No signs of volume overload on  exam, no dyspnea.  Management of fluid status with HD.  All other chronic medical conditions stable throughout admission and managed with home regimens.  Issues for Follow Up:  1) Adherence to HD  Significant Procedures: none  Significant Labs and Imaging:   Recent Labs Lab 08/10/14 1422 08/11/14 0230 08/12/14 0500  WBC 10.7* 13.8* 8.3  HGB 11.1* 11.0* 12.1  HCT 35.0* 34.0* 36.7  PLT 224 233 240    Recent Labs Lab 08/10/14 1422 08/11/14 0230 08/11/14 1507 08/12/14 0743  NA 138 138 138 136*  K 6.8* 6.7* 5.3 4.5  CL 93* 93* 94* 94*  CO2 24 23 26 26   GLUCOSE 164* 80 107* 102*  BUN 75* 82* 35* 19  CREATININE 12.19* 12.51* 7.33* 5.10*  CALCIUM 8.3* 8.2* 8.2* 8.7    BNP (last 3 results)  Recent Labs  06/04/14 2008 07/12/14 1249 08/10/14 1422  PROBNP 4928.0* 9434.0* 16099.0*    Troponins neg x2  CD4 1360, CD4 %Helper T cells 43  RPR nonreactive  CXR (11/2): Stable chest.  EKG 08/10/2014: Normal sinus rhythm, slightly more prominent T waves but not overtly peaked  Results/Tests Pending at Time of Discharge: Ultra HIV quant and genotype  Discharge Medications:    Medication List    ASK your doctor about these medications        amLODipine 5 MG tablet  Commonly known as:  NORVASC  Take 10 mg by mouth at bedtime.     calcitRIOL 0.5 MCG capsule  Commonly known as:  ROCALTROL  Take 2 mcg by mouth daily. Takes on Tuesday, Thursday, and Sat with Dialysis     cinacalcet 90 MG tablet  Commonly known as:  SENSIPAR  Take 90 mg by mouth daily.     colchicine 0.6 MG tablet  Take 0.6 mg by mouth daily.     diphenhydrAMINE 25 mg capsule  Commonly known as:  BENADRYL  Take 50 mg by mouth at bedtime as needed for sleep.     FLUoxetine 20 MG capsule  Commonly known as:  PROZAC  Take 20 mg by mouth daily.     folic acid-vitamin b complex-vitamin c-selenium-zinc 3 MG Tabs tablet  Take 1 tablet by mouth daily.     hydrALAZINE 25 MG tablet  Commonly known  as:  APRESOLINE  Take 25 mg by mouth every 8 (eight) hours.     HYDROcodone-acetaminophen 5-325 MG per tablet  Commonly known as:  NORCO/VICODIN  Take 1-2 tablets by mouth every 4 (four) hours as needed.     HYDROmorphone 4 MG tablet  Commonly known as:  DILAUDID  Take 0.5 tablets (2 mg total) by mouth 2 (two) times daily as needed for severe pain.     isosorbide mononitrate 30 MG 24 hr tablet  Commonly known as:  IMDUR  Take 30 mg by mouth daily.     lamivudine 5 MG/ML solution  Commonly known as:  EPIVIR-HBV  Take 5 mLs (25 mg total) by mouth daily.     LORazepam 0.5 MG tablet  Commonly known as:  ATIVAN  Take 0.5 mg by mouth every 12 (twelve) hours as needed for anxiety.     omeprazole 20 MG capsule  Commonly known as:  PRILOSEC  Take 20 mg by mouth daily.     oxyCODONE-acetaminophen 5-325 MG per tablet  Commonly known as:  PERCOCET/ROXICET  Take 1 tablet by mouth every 8 (eight)  hours as needed for severe pain.     polyethylene glycol packet  Commonly known as:  MIRALAX / GLYCOLAX  Take 17 g by mouth daily as needed for mild constipation.     predniSONE 5 MG tablet  Commonly known as:  DELTASONE  Take 1 tablet (5 mg total) by mouth daily. Prednisone dose pack: 30 mg on Day one (6 tabs) and two (6 tabs), 25 mg on day three (5 tabs) and four, 20 (5 tabs) mg on day five (4 tabs) and six (4 tabs), 15 mg on day seven (3 tabs) and eight (3 tabs), 10 mg on day nine (2 tabs) and ten (2 tabs), 5 mg on day eleven ( 1 tab) and twelve ( 1 tab).     raltegravir 400 MG tablet  Commonly known as:  ISENTRESS  Take 1 tablet (400 mg total) by mouth 2 (two) times daily.     sevelamer carbonate 800 MG tablet  Commonly known as:  RENVELA  Take 2,400 mg by mouth 3 (three) times daily with meals.     tenofovir 300 MG tablet  Commonly known as:  VIREAD  Take 1 tablet (300 mg total) by mouth once a week. Give on Friday        Discharge Instructions: Please refer to Patient  Instructions section of EMR for full details.  Patient was counseled important signs and symptoms that should prompt return to medical care, changes in medications, dietary instructions, activity restrictions, and follow up appointments.   Follow-Up Appointments:   Shirlee LatchAngela Bacigalupo, MD 08/13/2014, 7:27 PM PGY-1, [name]

## 2014-08-11 NOTE — Plan of Care (Signed)
Problem: Phase I Progression Outcomes Goal: Dyspnea controlled at rest Outcome: Progressing     

## 2014-08-11 NOTE — Progress Notes (Signed)
Patient refused telemetry,patient took it off and threw it on the floor with her gown.Dr. Ermalinda MemosBradshaw made awreLeft upper arm AVF bleeding,dressing reinforced.Advised patient to limit movement on left arm to prevent it from bleeding. Baltazar Pekala, [name], RCharity fundraiser

## 2014-08-11 NOTE — Care Management Note (Signed)
CARE MANAGEMENT NOTE 08/11/2014  Patient:  [name],[name]   Account Number:  [id number]  Date Initiated:  08/11/2014  Documentation initiated by:  Damon Baisch  Subjective/Objective Assessment:   CM following for progession and d/c planning.     Action/Plan:   Per pt Renal MD, this pt needs to be [place] [facility] [place], per pt wishes. Call placed to that facility and await response.   Anticipated DC Date:     Anticipated DC Plan:  HOME/SELF CARE         Choice offered to / List presented to:             Status of service:  In process, will continue to follow Medicare Important Message given?   (If response is "NO", the following Medicare IM given date fields will be blank) Date Medicare IM given:   Medicare IM given by:   Date Additional Medicare IM given:   Additional Medicare IM given by:    Discharge Disposition:    Per UR Regulation:    If discussed at Long Length of Stay Meetings, dates discussed:    Comments:  10/11/2013 Call placed to nearest HD center in [place], [place] , central intake , [name] @ [phone] and message left re pt and HD needs. Will fax info as soon as call is returned. Fax # [phone].  Will continue to follow and provide info to Central Intake for [facility] central intake.    CRoyal RN MPH, case manager, [phone]

## 2014-08-11 NOTE — Progress Notes (Signed)
UR completed 

## 2014-08-11 NOTE — Progress Notes (Signed)
Family Medicine Teaching Service Daily Progress Note Intern Pager: [phone]  Patient name: [name] Medical record number: [id number] Date of birth: 1951/07/27 Age: 63 y.o. Gender: female  Primary Care Provider: No PCP Per Patient Consultants: Nephrology Code Status: Partial - DNI  Pt Overview and Major Events to Date:  11/2 - Admit to FPTS for hyperkalemia and emergent HD  Assessment and Plan: [name] is a 63 y.o. year old female presenting with Hyperkalemia after missing dialysis and chest pain. Her past medical history significant for HIV, ESRD, HTN,tobacco and cocaineabuse, obesity,right hip osteonecrosis  Hyperkalemia in setting of ESRD and missed HD: K 6.8 on admission. S/p HD O/N. K 6.7 this AM while still in HD. On the previous admission it's clear that she was missing dialysis sessions however she states that her compliance has been good since discharge then. - EKG on admission with only slightly peaked T waves, continue telemetry - Nephrology consulted-appreciate recommendations.  - Continue renal vitamins, Aranesp, sevelamer, calcitriol - BMP post-HD pending  - BNP elevated showing evidence of volume overload, however clinically is not severe and she denies dyspnea.  Chest pain:  Likely musculoskeletal - Troponin neg X 2 - EKG unchanged from previous - K pad, lidoderm patch  Right hip pain:  Known right hip osteonecrosis, she is to follow-up with orthopedic surgery outpatient for this. - Prn pain medications ordered per home regimen, Norco for moderate pain and Dilaudid PO for severe pain  Leukocytosis: Unclear etiology, monitor for infectious process - mild elevation to 10.7 > 13.8  HIV infection - Continue antiretrovirals  Hypertension: Currently elevated, s/p HD - continue amlodipine, hydralazine, Imdur  Anemia, macrocytic: hemoglobin stable at 11 - Likely anemia of chronic renal disease, however with macrocytosis would consider folate  and B-12 deficiency  FEN/GI: renal diet Prophylaxis: subcutaneous heparin  Disposition: Pending improvement in K  Subjective:  Denies missing dialysis at all.  Denies any CP, SOB.  Objective: Temp:  [97.5 F (36.4 C)-98.7 F (37.1 C)] 98.2 F (36.8 C) (11/03 0644) Pulse Rate:  [91-109] 101 (11/03 0644) Resp:  [12-22] 18 (11/03 0644) BP: (143-189)/(71-124) 143/124 mmHg (11/03 0644) SpO2:  [93 %-100 %] 98 % (11/03 0644) Weight:  [246 lb (111.585 kg)-263 lb 7.2 oz (119.5 kg)] 258 lb 13.1 oz (117.4 kg) (11/03 14780629) Physical Exam: Gen: NAD, alert, cooperative with exam, lying in bed HEENT: NCAT, EOMI, MMM CV: RRR, good S1/S2, no murmur Resp: CTABL, no wheezes, non-labored Abd: obese Ext: No edema, warm Neuro: Alert and oriented, Strength 5/5 in bilateral lower extremities, EOMI, normal speech Psych: Normal mood and affect  Laboratory:  Recent Labs Lab 08/10/14 1422 08/11/14 0230  WBC 10.7* 13.8*  HGB 11.1* 11.0*  HCT 35.0* 34.0*  PLT 224 233    Recent Labs Lab 08/10/14 1422 08/11/14 0230  NA 138 138  K 6.8* 6.7*  CL 93* 93*  CO2 24 23  BUN 75* 82*  CREATININE 12.19* 12.51*  CALCIUM 8.3* 8.2*  GLUCOSE 164* 80    BNP (last 3 results)  Recent Labs  06/04/14 2008 07/12/14 1249 08/10/14 1422  PROBNP 4928.0* 9434.0* 16099.0*   Troponins neg x2  Imaging/Diagnostic Tests: CXR (11/2): Stable chest.  EKG 08/10/2014: Normal sinus rhythm, slightly more prominent T waves but not overtly peaked   Shirlee LatchAngela Bacigalupo, MD 08/11/2014, 9:49 AM PGY-1, [facility] FPTS Intern pager: [phone], text pages welcome

## 2014-08-11 NOTE — Clinical Social Work Note (Addendum)
CSW spoke with patient at bedside.  Patient stated that [place] lives in [place] and [place] [facility] [phone]).  Transportation [place] because of distance.    Patient stated that [place] transportation is willing to take [place] to [place], [place], [place], and [place] dialysis centers.  Patient needs to be matched with a dialysis center closer to her home, patient was cliped with [place] because patient was in SNF facilities in the area. Patient stated that she is only willing to go to [place] or [place] centers.  RN Case manager aware.  Jean LotJenna [name], LCSWA Clinical Social Worker [phone]

## 2014-08-11 NOTE — Progress Notes (Signed)
Report called to Charito C. RN 6E. Pt alert, hypertensive, returned safely to room on 6E.

## 2014-08-11 NOTE — Progress Notes (Signed)
CRITICAL VALUE ALERT  Critical value received:  K=6.7  Date of notification:  04/10/14  Time of notification:  348  Critical value read back:yes Nurse who received alert:  Johnell ComingsAngelito Marlane Hirschmann,RN  MD notified (1st page):  yes  Time of first page:  0349  MD notified (2nd page):  Time of second page:  Responding MD:  Briant CedarMattingly  Time MD responded:  [phone]

## 2014-08-11 NOTE — Progress Notes (Addendum)
Subjective:   AVF bleeding post HD- keeps taking dressing off  Objective Filed Vitals:   08/11/14 0530 08/11/14 0600 08/11/14 0629 08/11/14 0644  BP: 165/123 166/92 184/106 143/124  Pulse: 91 97 103 101  Temp:   98.4 F (36.9 C) 98.2 F (36.8 C)  TempSrc:   Oral   Resp: 18 12 17 18   Height:      Weight:   117.4 kg (258 lb 13.1 oz)   SpO2: 100%  99% 98%   Physical Exam General: alert and oriented. No acute distress.  Heart: RRR Lungs: CTA. Unlabored.  Abdomen: soft, obese. nontender +BS Extremities: no edema  Dialysis Access: L AVF +thrill. Bleeding post HD- dressing changed  Dialysis Orders: MWF @ [facility]  4 hr 109kgs 2K/2.25ca 9800 Heparin L AVF. 400/800 Calcitriol 2.25 mcg Aranesp 25q qweek Venofe 50 q week  Assessment/Plan: 1. Chest pain- per primary. EKG SR. troponin negative.  2. Dysuria- check UA 3. ESRD - MWF [facility]. DH last night- pending this afternoon again. Is going to need a new outpt center as [place] no longer transportation [place]. History of noncomplaince.  4. Hypertension/volume - 6 kgs over edw per wts- 4L removed in HD. Was not taking any BP meds- norvasc. Mild vascular congestion on chest xray.  5. Anemia - hgb 11- cont aranesp 25 and Fe q week.  6. Metabolic bone disease - Ca+ 8.2, cont calcitriol. Has not been taking sensipar but last PTH 788, phos 5.9-continue sensipar and renvela 7. Nutrition - renavit. Last alb 4. Will need renal diet. 8. Anxiety- per primary, has been on lorazepam in the past.  9. HIV- per primary, not currently on meds at home 10. dispo- per notes medical director in [place] said he will never take pt back to that unit. She [place] get [place].  Jetty DuhamelBridget Whelan, NP [facility] Beeper [phone] 08/11/2014,9:58 AM  LOS: 1 day   Pt seen, examined and agree w A/P as above. Still vol overloaded after HD early this am. Plan HD tomorrow. She  tells me [place] transportation people in [place] said they would take [place] to dialysis in [place] or [place], but will no longer drive [place] to [place].  [place] and has asked that we try and get her into the dialysis unit there. I have asked our 6E CM to investigate this.  Vinson Moselleob Torence Palmeri MD pager [phone]    cell [phone] 08/11/2014, 11:42 AM    Additional Objective Labs: Basic Metabolic Panel:  Recent Labs Lab 08/10/14 1422 08/11/14 0230  NA 138 138  K 6.8* 6.7*  CL 93* 93*  CO2 24 23  GLUCOSE 164* 80  BUN 75* 82*  CREATININE 12.19* 12.51*  CALCIUM 8.3* 8.2*   Liver Function Tests: No results for input(s): AST, ALT, ALKPHOS, BILITOT, PROT, ALBUMIN in the last 168 hours. No results for input(s): LIPASE, AMYLASE in the last 168 hours. CBC:  Recent Labs Lab 08/10/14 1422 08/11/14 0230  WBC 10.7* 13.8*  HGB 11.1* 11.0*  HCT 35.0* 34.0*  MCV 108.4* 105.3*  PLT 224 233   Blood Culture No results found for: SDES, SPECREQUEST, CULT, REPTSTATUS  Cardiac Enzymes:  Recent Labs Lab 08/10/14 1843 08/11/14 0100  TROPONINI <0.30 <0.30   CBG:  Recent Labs Lab 08/10/14 1759 08/10/14 1923 08/10/14 1947 08/10/14 2149  GLUCAP 132* 47* 104* 200*   Iron Studies: No results for input(s): IRON, TIBC, TRANSFERRIN, FERRITIN  in the last 72 hours. @lablastinr3 @ Studies/Results: Dg Chest 2 View  08/10/2014   CLINICAL DATA:  Left-sided chest pain and shortness of breath.  EXAM: CHEST  2 VIEW  COMPARISON:  07/2009 15.  FINDINGS: There is borderline increased cardiac size. Tortuous aorta. Mild vascular congestion. Slight platelike atelectasis LEFT midlung zone, versus scarring. No edema or consolidation. No osseous findings. Similar appearance to priors.  IMPRESSION: Stable chest.   Electronically Signed   By: Davonna BellingJohn  Curnes M.D.   On: 08/10/2014 14:12   Medications:   . amLODipine  10 mg Oral QHS  . calcitRIOL  2 mcg Oral Q M,W,F-HD  . cinacalcet  90 mg Oral Q  breakfast  . colchicine  0.6 mg Oral Daily  . [START ON 08/12/2014] darbepoetin (ARANESP) injection - DIALYSIS  25 mcg Intravenous Q Wed-HD  . [START ON 08/12/2014] ferric gluconate (FERRLECIT/NULECIT) IV  62.5 mg Intravenous Q Wed-HD  . FLUoxetine  20 mg Oral Daily  . heparin  5,000 Units Subcutaneous 3 times per day  . hydrALAZINE  25 mg Oral 3 times per day  . HYDROcodone-acetaminophen      . isosorbide mononitrate  30 mg Oral Daily  . lamiVUDine  25 mg Oral Daily  . multivitamin  1 tablet Oral QHS  . pantoprazole  40 mg Oral Daily  . raltegravir  400 mg Oral BID  . sevelamer carbonate  2,400 mg Oral TID WC  . sodium chloride  3 mL Intravenous Q12H  . sodium chloride  3 mL Intravenous Q12H  . [START ON 08/14/2014] tenofovir  300 mg Oral Q Fri

## 2014-08-11 NOTE — Consult Note (Signed)
[facility] for Infectious Disease    Date of Admission:  08/10/2014  Date of Consult:  08/11/2014  Reason for Consult: HIV + and without meds for 3 weeks Referring Physician: Dr. Nori Riis   HPI: [name] is an 63 y.o. female who was admitted to [facility] to [place] with Hyperkalemia post missing HD due to transportation issues. She states that she has been out of her HIV meds for 3 weeks due to pharmacy not filling meds and also believes that SNF has not been dosing her meds correctly. I have reviewed her records from [facility] via Care everywhere and every time she has come in for labs there in recent hx she has had VL <20 and healthy cd4 typically over 1k. When last seen here in ID clinic with labs VL also <20 and  CD4 1070.   Past Medical History  Diagnosis Date  . Hypertension   . HIV infection   . Morbid obesity   . ESRD (end stage renal disease) on dialysis 09/30/2013    Started dialysis in [place], [place] around 2009.  ESRD due to HTN vs drug abuse according to pt.  Was on dialysis at [facility] until Feb 2015 when she was admitted to a SNF due to homelessness and drug abuse.  Then changed to [facility] on TTS schedule.     . Anginal pain   . CHF (congestive heart failure)   . Shortness of breath   . Pneumonia   . Depression   . Anxiety   . GERD (gastroesophageal reflux disease)   . Arthritis   . Anemia   . Hyperkalemia 08/11/2014    Past Surgical History  Procedure Laterality Date  . Arteriovenous graft placement      left forearm  . Cardiac catheterization    . Laparotomy      states seh was cut open because seh was bleeding on the inside  ergies:   Allergies  Allergen Reactions  . Tramadol Other (See Comments)    "Gives me bad dreams"  . Morphine And Related Hives and Rash     Medications: I have reviewed patients current medications as documented in Epic Anti-infectives    Start     Dose/Rate Route Frequency Ordered Stop   08/14/14 1000  tenofovir  (VIREAD) tablet 300 mg  Status:  Discontinued     300 mg Oral Every Fri 08/10/14 2109 08/11/14 1746   08/11/14 2200  raltegravir (ISENTRESS) tablet 400 mg     400 mg Oral 2 times daily 08/11/14 1752     08/11/14 1800  lamiVUDine (EPIVIR) 10 MG/ML solution 25 mg     25 mg Oral Daily 08/11/14 1752     08/11/14 1800  tenofovir (VIREAD) tablet 300 mg     300 mg Oral Weekly 08/11/14 1752     08/10/14 2200  raltegravir (ISENTRESS) tablet 400 mg  Status:  Discontinued     400 mg Oral 2 times daily 08/10/14 2109 08/11/14 1746   08/10/14 2200  lamiVUDine (EPIVIR) 10 MG/ML solution 25 mg  Status:  Discontinued     25 mg Oral Daily 08/10/14 2123 08/11/14 1746   08/10/14 2115  lamivudine (EPIVIR-HBV) 5 MG/ML solution 25 mg  Status:  Discontinued     25 mg Oral Daily 08/10/14 2109 08/10/14 2121      Social History:  reports that she has been smoking Cigarettes.  She started smoking about 3 months ago. She has been smoking  about 0.50 packs per day. She has never used smokeless tobacco. She reports that she does not drink alcohol or use illicit drugs.  Family History  Problem Relation Age of Onset  . Kidney failure Other     niece  . High blood pressure    . Lung cancer    . Breast cancer Neg Hx   . Colon cancer Neg Hx   . Stroke Mother   . HIV/AIDS Brother     died of AIDS    As in HPI and primary teams notes otherwise 12 point review of systems is negative  Blood pressure 143/124, pulse 101, temperature 98.2 F (36.8 C), temperature source Oral, resp. rate 18, height 5\' 4"  (1.626 m), weight 258 lb 13.1 oz (117.4 kg), SpO2 98 %. General: Alert and awake, oriented x3, not in any acute distress. HEENT: EOMI, oropharynx clear and without exudate CVS regular rate, normal r,  no murmur rubs or gallops Chest:  no wheezing, Abdomen: soft  nondistended,  Neuro: nonfocal, strength and sensation intact   Results for orders placed or performed during the hospital encounter of 08/10/14 (from the  past 48 hour(s))  I-stat troponin, ED (not at [facility])     Status: None   Collection Time: 08/10/14  1:31 PM  Result Value Ref Range   Troponin i, poc 0.03 0.00 - 0.08 ng/mL   Comment 3            Comment: Due to the release kinetics of cTnI, a negative result within the first hours of the onset of symptoms does not rule out myocardial infarction with certainty. If myocardial infarction is still suspected, repeat the test at appropriate intervals.   CBC     Status: Abnormal   Collection Time: 08/10/14  2:22 PM  Result Value Ref Range   WBC 10.7 (H) 4.0 - 10.5 K/uL   RBC 3.23 (L) 3.87 - 5.11 MIL/uL   Hemoglobin 11.1 (L) 12.0 - 15.0 g/dL   HCT 13/02/15 (L) 58.4 - 83.5 %   MCV 108.4 (H) 78.0 - 100.0 fL   MCH 34.4 (H) 26.0 - 34.0 pg   MCHC 31.7 30.0 - 36.0 g/dL   RDW 07.5 73.2 - 25.6 %   Platelets 224 150 - 400 K/uL  Basic metabolic panel     Status: Abnormal   Collection Time: 08/10/14  2:22 PM  Result Value Ref Range   Sodium 138 137 - 147 mEq/L   Potassium 6.8 (HH) 3.7 - 5.3 mEq/L    Comment: NO VISIBLE HEMOLYSIS CRITICAL RESULT CALLED TO, READ BACK BY AND VERIFIED WITH: E BRADDICK,RN [id number] 08/10/14 WBOND    Chloride 93 (L) 96 - 112 mEq/L   CO2 24 19 - 32 mEq/L   Glucose, Bld 164 (H) 70 - 99 mg/dL   BUN 75 (H) 6 - 23 mg/dL   Creatinine, Ser 13/02/15 (H) 0.50 - 1.10 mg/dL   Calcium 8.3 (L) 8.4 - 10.5 mg/dL   GFR calc non Af Amer 3 (L) >90 mL/min   GFR calc Af Amer 3 (L) >90 mL/min    Comment: (NOTE) The eGFR has been calculated using the CKD EPI equation. This calculation has not been validated in all clinical situations. eGFR's persistently <90 mL/min signify possible Chronic Kidney Disease.    Anion gap 21 (H) 5 - 15  BNP (order ONLY if patient complains of dyspnea/SOB AND you have documented it for THIS visit)     Status: Abnormal   Collection Time:  08/10/14  2:22 PM  Result Value Ref Range   Pro B Natriuretic peptide (BNP) 16099.0 (H) 0 - 125 pg/mL  CBG monitoring, ED      Status: Abnormal   Collection Time: 08/10/14  5:59 PM  Result Value Ref Range   Glucose-Capillary 132 (H) 70 - 99 mg/dL  Troponin I     Status: None   Collection Time: 08/10/14  6:43 PM  Result Value Ref Range   Troponin I <0.30 <0.30 ng/mL    Comment:        Due to the release kinetics of cTnI, a negative result within the first hours of the onset of symptoms does not rule out myocardial infarction with certainty. If myocardial infarction is still suspected, repeat the test at appropriate intervals.   CBG monitoring, ED     Status: Abnormal   Collection Time: 08/10/14  7:23 PM  Result Value Ref Range   Glucose-Capillary 47 (L) 70 - 99 mg/dL  CBG monitoring, ED     Status: Abnormal   Collection Time: 08/10/14  7:47 PM  Result Value Ref Range   Glucose-Capillary 104 (H) 70 - 99 mg/dL  Glucose, capillary     Status: Abnormal   Collection Time: 08/10/14  9:49 PM  Result Value Ref Range   Glucose-Capillary 200 (H) 70 - 99 mg/dL  Troponin I     Status: None   Collection Time: 08/11/14  1:00 AM  Result Value Ref Range   Troponin I <0.30 <0.30 ng/mL    Comment:        Due to the release kinetics of cTnI, a negative result within the first hours of the onset of symptoms does not rule out myocardial infarction with certainty. If myocardial infarction is still suspected, repeat the test at appropriate intervals.   Basic metabolic panel     Status: Abnormal   Collection Time: 08/11/14  2:30 AM  Result Value Ref Range   Sodium 138 137 - 147 mEq/L   Potassium 6.7 (HH) 3.7 - 5.3 mEq/L    Comment: CRITICAL RESULT CALLED TO, READ BACK BY AND VERIFIED WITH: A. [facility] RN [id number] [phone] GREEN R    Chloride 93 (L) 96 - 112 mEq/L   CO2 23 19 - 32 mEq/L   Glucose, Bld 80 70 - 99 mg/dL   BUN 82 (H) 6 - 23 mg/dL   Creatinine, Ser 12.51 (H) 0.50 - 1.10 mg/dL   Calcium 8.2 (L) 8.4 - 10.5 mg/dL   GFR calc non Af Amer 3 (L) >90 mL/min   GFR calc Af Amer 3 (L) >90 mL/min    Comment:  (NOTE) The eGFR has been calculated using the CKD EPI equation. This calculation has not been validated in all clinical situations. eGFR's persistently <90 mL/min signify possible Chronic Kidney Disease.    Anion gap 22 (H) 5 - 15  CBC     Status: Abnormal   Collection Time: 08/11/14  2:30 AM  Result Value Ref Range   WBC 13.8 (H) 4.0 - 10.5 K/uL   RBC 3.23 (L) 3.87 - 5.11 MIL/uL   Hemoglobin 11.0 (L) 12.0 - 15.0 g/dL   HCT 34.0 (L) 36.0 - 46.0 %   MCV 105.3 (H) 78.0 - 100.0 fL   MCH 34.1 (H) 26.0 - 34.0 pg   MCHC 32.4 30.0 - 36.0 g/dL   RDW 14.9 11.5 - 15.5 %   Platelets 233 150 - 400 K/uL  Basic metabolic panel     Status:  Abnormal   Collection Time: 08/11/14  3:07 PM  Result Value Ref Range   Sodium 138 137 - 147 mEq/L   Potassium 5.3 3.7 - 5.3 mEq/L    Comment: DELTA CHECK NOTED   Chloride 94 (L) 96 - 112 mEq/L   CO2 26 19 - 32 mEq/L   Glucose, Bld 107 (H) 70 - 99 mg/dL   BUN 35 (H) 6 - 23 mg/dL    Comment: DELTA CHECK NOTED   Creatinine, Ser 7.33 (H) 0.50 - 1.10 mg/dL    Comment: DELTA CHECK NOTED   Calcium 8.2 (L) 8.4 - 10.5 mg/dL   GFR calc non Af Amer 5 (L) >90 mL/min   GFR calc Af Amer 6 (L) >90 mL/min    Comment: (NOTE) The eGFR has been calculated using the CKD EPI equation. This calculation has not been validated in all clinical situations. eGFR's persistently <90 mL/min signify possible Chronic Kidney Disease.    Anion gap 18 (H) 5 - 15   '@BRIEFLABTABLE'$ (sdes,specrequest,cult,reptstatus)   ) Recent Results (from the past 720 hour(s))  MRSA PCR Screening     Status: None   Collection Time: 07/18/14 11:00 PM  Result Value Ref Range Status   MRSA by PCR NEGATIVE NEGATIVE Final    Comment:        The GeneXpert MRSA Assay (FDA approved for NASAL specimens only), is one component of a comprehensive MRSA colonization surveillance program. It is not intended to diagnose MRSA infection nor to guide or monitor treatment for MRSA infections.      Impression/Recommendation  Active Problems:   Hyperkalemia   [name] is a 63 y.o. female with  HIV, ESRD on HD  #1  HIV:  I have some concerns if she truly was receiving ONLY one isentress tablet daily at SNF, but I do not have access to those records.   --I will check another Ultra HIV quant with reflex genotype and HIV integrase genotype. I am worried if she was receiving wrong dose of isentress she will now have INI resistance  Interestingly per Dr. Henreitta Leber most recent note patient had VL <20 after being off meds for several months? I wonder if she may have a very small HIV reservoir size  I will continue her current regimen until we get more data back.  She will need to be seen in the next 3 weeks in ID clinic for recheck VL and CD4     08/11/2014, 5:53 PM   Thank you so much for this interesting consult  [place] [facility] [place] [phone] (pager) [phone] (office) 08/11/2014, 5:53 PM  [place] 08/11/2014, 5:53 PM

## 2014-08-11 NOTE — Plan of Care (Signed)
Problem: Phase I Progression Outcomes Goal: Pain controlled with appropriate interventions Outcome: Progressing Goal: Dyspnea controlled at rest Outcome: Progressing Goal: Skin without signs of pressure Outcome: Progressing Goal: Child psychotherapistocial Worker consult (if SNF or HD pt.) Outcome: Progressing

## 2014-08-12 DIAGNOSIS — R079 Chest pain, unspecified: Secondary | ICD-10-CM

## 2014-08-12 LAB — BASIC METABOLIC PANEL
Anion gap: 16 — ABNORMAL HIGH (ref 5–15)
BUN: 19 mg/dL (ref 6–23)
CHLORIDE: 94 meq/L — AB (ref 96–112)
CO2: 26 meq/L (ref 19–32)
CREATININE: 5.1 mg/dL — AB (ref 0.50–1.10)
Calcium: 8.7 mg/dL (ref 8.4–10.5)
GFR calc non Af Amer: 8 mL/min — ABNORMAL LOW (ref >90)
GFR, EST AFRICAN AMERICAN: 10 mL/min — AB (ref >90)
Glucose, Bld: 102 mg/dL — ABNORMAL HIGH (ref 70–99)
POTASSIUM: 4.5 meq/L (ref 3.7–5.3)
SODIUM: 136 meq/L — AB (ref 137–147)

## 2014-08-12 LAB — CBC
HCT: 36.7 % (ref 36.0–46.0)
HEMOGLOBIN: 12.1 g/dL (ref 12.0–15.0)
MCH: 34.6 pg — ABNORMAL HIGH (ref 26.0–34.0)
MCHC: 33 g/dL (ref 30.0–36.0)
MCV: 104.9 fL — ABNORMAL HIGH (ref 78.0–100.0)
Platelets: 240 10*3/uL (ref 150–400)
RBC: 3.5 MIL/uL — ABNORMAL LOW (ref 3.87–5.11)
RDW: 14.7 % (ref 11.5–15.5)
WBC: 8.3 10*3/uL (ref 4.0–10.5)

## 2014-08-12 LAB — RPR

## 2014-08-12 MED ORDER — ONDANSETRON 4 MG PO TBDP
4.0000 mg | ORAL_TABLET | Freq: Once | ORAL | Status: AC
  Administered 2014-08-12: 4 mg via ORAL
  Filled 2014-08-12: qty 1

## 2014-08-12 MED ORDER — DARBEPOETIN ALFA 25 MCG/0.42ML IJ SOSY
PREFILLED_SYRINGE | INTRAMUSCULAR | Status: AC
  Administered 2014-08-12: 25 ug
  Filled 2014-08-12: qty 0.42

## 2014-08-12 MED ORDER — LIDOCAINE 5 % EX PTCH
1.0000 | MEDICATED_PATCH | CUTANEOUS | Status: DC
  Administered 2014-08-13: 1 via TRANSDERMAL
  Filled 2014-08-12 (×14): qty 1

## 2014-08-12 NOTE — Clinical Documentation Improvement (Signed)
Risk Factors: CHF noted per 11/03 progress notes.  Labs: 11/02: proBNP: 16,099.0.   . Document acuity --Acute --Chronic --Acute on Chronic  . Document type --Diastolic --Systolic --Combined systolic and diastolic  . Due to or associated with --Cardiac or other surgery --Hypertension --Valvular disease --Rheumatic heart disease Endocarditis (valvitis) Pericarditis Myocarditis --Other (specify)   Thank Gabriel CirriYou,   Azazel Franze Mathews-Bethea,RN,BSN,Clinical Documentation Specialist [phone] [email or web address]

## 2014-08-12 NOTE — Progress Notes (Signed)
Family Medicine Teaching Service Daily Progress Note Intern Pager: [phone]  Patient name: [name] Medical record number: [id number] Date of birth: 11-Jul-1951 Age: 63 y.o. Gender: female  Primary Care Provider: No PCP Per Patient Consultants: Nephrology Code Status: Partial - DNI  Pt Overview and Major Events to Date:  11/2 - Admit to FPTS for hyperkalemia and emergent HD  Assessment and Plan: [name] is a 63 y.o. year old female presenting with Hyperkalemia after missing dialysis and chest pain. Her past medical history significant for HIV, ESRD, HTN,tobacco and cocaineabuse, obesity,right hip osteonecrosis  Hyperkalemia in setting of ESRD and missed HD: K 6.8 > S/p HD 11/2> K 6.7 while still in HD> 5.3. On the previous admission it's clear that she was missing dialysis sessions however she states that her compliance has been good since discharge then.  Unable to find a HD center to take the patient after discharge. In HD currently. - EKG on admission with only slightly peaked T waves, continue telemetry - Nephrology consulted-appreciate recommendations.  - Continue renal vitamins, Aranesp, sevelamer, calcitriol - BMP post-HD pending  - BNP elevated showing evidence of volume overload, however clinically is not severe and she denies dyspnea.  Chest pain:  Onset after moving furniture.  Likely musculoskeletal. Troponin neg X 2.  EKG unchanged from previous - Continue K pad, lidoderm patch  Right hip pain:  Known right hip osteonecrosis, she is to follow-up with orthopedic surgery outpatient for this. - Prn pain medications ordered per home regimen, Percocet for moderate pain and Dilaudid PO for severe pain  Leukocytosis: Resolved. Unclear etiology, WBC 10.7>13.8>8.3  HIV infection: Patient has been without antiretrovirals for ~2-3 weeks.  Followed by ID in [place]. - ID following, appreciate recs - Continue antiretrovirals - f/u HIV quant and  genotype  Hypertension: Currently controlled. Hypertensive to sbps of 150-160 O/N. - continue amlodipine, hydralazine, Imdur  Anemia, macrocytic: hemoglobin 11>12.1. Likely anemia of chronic renal disease, however with macrocytosis would consider folate and B-12 deficiency - Continue to monitor - Continue home Aranesp  FEN/GI: renal diet, KVO Prophylaxis: subcutaneous heparin  Disposition: Pending improvement in K and finding HD center [facility] [facility] not [facility] and [facility] - Nephrology and SW working on this.  Subjective:  Feels well today. No CP, SOB, Pain in L shoulder continues.  Feels "too light" in chest. Cannot elicit what she means by this.  Objective: Temp:  [97.6 F (36.4 C)-98.8 F (37.1 C)] 98.2 F (36.8 C) (11/04 0715) Pulse Rate:  [87-113] 108 (11/04 0935) Resp:  [16-18] 18 (11/04 0715) BP: (97-176)/(38-122) 97/69 mmHg (11/04 0935) SpO2:  [92 %-100 %] 93 % (11/04 0715) Weight:  [237 lb 14 oz (107.9 kg)] 237 lb 14 oz (107.9 kg) (11/03 2235) Physical Exam: Gen: NAD, alert, cooperative with exam, lying in bed on side HEENT: NCAT, EOMI, MMM CV: RRR, good S1/S2, no murmur Resp: CTABL, no wheezes, non-labored Abd: obese, NT Ext: No edema, warm Neuro: Alert and oriented, Strength 5/5 in bilateral lower extremities, EOMI, normal speech Psych: Normal mood and affect  Laboratory:  Recent Labs Lab 08/10/14 1422 08/11/14 0230 08/12/14 0500  WBC 10.7* 13.8* 8.3  HGB 11.1* 11.0* 12.1  HCT 35.0* 34.0* 36.7  PLT 224 233 240    Recent Labs Lab 08/11/14 0230 08/11/14 1507 08/12/14 0743  NA 138 138 136*  K 6.7* 5.3 4.5  CL 93* 94* 94*  CO2 23 26 26   BUN 82*  35* 19  CREATININE 12.51* 7.33* 5.10*  CALCIUM 8.2* 8.2* 8.7  GLUCOSE 80 107* 102*    BNP (last 3 results)  Recent Labs  06/04/14 2008 07/12/14 1249 08/10/14 1422  PROBNP 4928.0* 9434.0* 16099.0*    Troponins neg x2  Imaging/Diagnostic  Tests: CXR (11/2): Stable chest.  EKG 08/10/2014: Normal sinus rhythm, slightly more prominent T waves but not overtly peaked   Shirlee LatchAngela Bacigalupo, MD 08/12/2014, 9:43 AM PGY-1, [facility] FPTS Intern pager: [phone], text pages welcome

## 2014-08-12 NOTE — Progress Notes (Signed)
[facility]    Subjective: No new complaints   Antibiotics:  Anti-infectives    Start     Dose/Rate Route Frequency Ordered Stop   08/14/14 1000  tenofovir (VIREAD) tablet 300 mg  Status:  Discontinued     300 mg Oral Every Fri 08/10/14 2109 08/11/14 1746   08/14/14 1000  tenofovir (VIREAD) tablet 300 mg     300 mg Oral Every Fri 08/11/14 1752     08/12/14 1000  lamiVUDine (EPIVIR) 10 MG/ML solution 25 mg     25 mg Oral Daily 08/11/14 1752     08/11/14 2200  raltegravir (ISENTRESS) tablet 400 mg     400 mg Oral 2 times daily 08/11/14 1752     08/10/14 2200  raltegravir (ISENTRESS) tablet 400 mg  Status:  Discontinued     400 mg Oral 2 times daily 08/10/14 2109 08/11/14 1746   08/10/14 2200  lamiVUDine (EPIVIR) 10 MG/ML solution 25 mg  Status:  Discontinued     25 mg Oral Daily 08/10/14 2123 08/11/14 1746   08/10/14 2115  lamivudine (EPIVIR-HBV) 5 MG/ML solution 25 mg  Status:  Discontinued     25 mg Oral Daily 08/10/14 2109 08/10/14 2121      Medications: Scheduled Meds: . amLODipine  10 mg Oral QHS  . calcitRIOL  2 mcg Oral Q M,W,F-HD  . cinacalcet  90 mg Oral Q breakfast  . [START ON 08/13/2014] colchicine  0.3 mg Oral Once per day on Mon Thu  . ferric gluconate (FERRLECIT/NULECIT) IV  62.5 mg Intravenous Q Wed-HD  . FLUoxetine  20 mg Oral Daily  . heparin  5,000 Units Subcutaneous 3 times per day  . hydrALAZINE  25 mg Oral 3 times per day  . isosorbide mononitrate  30 mg Oral Daily  . lamiVUDine  25 mg Oral Daily  . lidocaine  1 patch Transdermal Q24H  . multivitamin  1 tablet Oral QHS  . pantoprazole  40 mg Oral Daily  . raltegravir  400 mg Oral BID  . sevelamer carbonate  2,400 mg Oral TID WC  . sodium chloride  3 mL Intravenous Q12H  . sodium chloride  3 mL Intravenous Q12H  . [START ON 08/14/2014] tenofovir  300 mg Oral Q Fri   Continuous Infusions:  PRN Meds:.sodium chloride, albuterol, diphenhydrAMINE, HYDROmorphone, LORazepam,  oxyCODONE-acetaminophen, polyethylene glycol, sodium chloride    Objective: Weight change: -8 lb 2 oz (-3.685 kg)  Intake/Output Summary (Last 24 hours) at 08/12/14 2049 Last data filed at 08/12/14 1826  Gross per 24 hour  Intake    840 ml  Output   5232 ml  Net  -4392 ml   Blood pressure 99/76, pulse 100, temperature 98.7 F (37.1 C), temperature source Oral, resp. rate 20, height 5\' 4"  (1.626 m), weight 244 lb 0.8 oz (110.7 kg), SpO2 96 %. Temp:  [98.2 F (36.8 C)-98.8 F (37.1 C)] 98.7 F (37.1 C) (11/04 1819) Pulse Rate:  [90-113] 100 (11/04 1819) Resp:  [16-20] 20 (11/04 1819) BP: (97-164)/(38-108) 99/76 mmHg (11/04 1819) SpO2:  [92 %-100 %] 96 % (11/04 1819) Weight:  [237 lb 14 oz (107.9 kg)-249 lb 12.5 oz (113.3 kg)] 244 lb 0.8 oz (110.7 kg) (11/04 1135)  Physical Exam: General: Alert and awake, oriented x3, not in any acute distress. HEENT: anicteric sclera,]EOMI CVS regular rate, normal r,  no murmur rubs or gallops Chest: clear to auscultation bilaterally, no wheezing, rales or rhonchi Abdomen: soft nontender,  nondistended, normal bowel sounds, Skin: no rashes  Neuro: nonfocal  CBC:  Recent Labs Lab 08/10/14 1422 08/11/14 0230 08/12/14 0500  HGB 11.1* 11.0* 12.1  HCT 35.0* 34.0* 36.7  PLT 224 233 240     BMET  Recent Labs  08/11/14 1507 08/12/14 0743  NA 138 136*  K 5.3 4.5  CL 94* 94*  CO2 26 26  GLUCOSE 107* 102*  BUN 35* 19  CREATININE 7.33* 5.10*  CALCIUM 8.2* 8.7     Liver Panel  No results for input(s): PROT, ALBUMIN, AST, ALT, ALKPHOS, BILITOT, BILIDIR, IBILI in the last 72 hours.     Sedimentation Rate No results for input(s): ESRSEDRATE in the last 72 hours. C-Reactive Protein No results for input(s): CRP in the last 72 hours.  Micro Results: Recent Results (from the past 720 hour(s))  MRSA PCR Screening     Status: None   Collection Time: 07/18/14 11:00 PM  Result Value Ref Range Status   MRSA by PCR NEGATIVE  NEGATIVE Final    Comment:        The GeneXpert MRSA Assay (FDA approved for NASAL specimens only), is one component of a comprehensive MRSA colonization surveillance program. It is not intended to diagnose MRSA infection nor to guide or monitor treatment for MRSA infections.    Studies/Results: No results found.    Assessment/Plan:  Active Problems:   Hyperkalemia   HIV disease   ESRD on dialysis   Chest pain    [name] is a 63 y.o. female with  HIV, ESRD on HD  #1 HIV:  I DID have  some concerns if she truly was receiving ONLY one isentress tablet daily at SNF, but after talking to the patient it appears that she was really referring to some of her NON HIV MEDS and it sounds like she was receivign FULL DOSE Isentress with perhaps higher dose of 3tc and weekly TNF  --I have ordered   another Ultra HIV quant with reflex genotype and HIV integrase genotype.  Continue her current regimen  She needs to be seen in [facility] in the next 3-4 weeks for repeat VL and CD4  I will sign off for now and arrange HSFU    LOS: 2 days   [name] 08/12/2014, 8:49 PM

## 2014-08-12 NOTE — Plan of Care (Signed)
Problem: Phase I Progression Outcomes Goal: Skin without signs of pressure Outcome: Completed/Met Date Met:  08/12/14

## 2014-08-12 NOTE — Plan of Care (Signed)
Problem: Phase I Progression Outcomes Goal: Dyspnea controlled at rest Outcome: Completed/Met Date Met:  08/12/14     

## 2014-08-12 NOTE — Progress Notes (Signed)
Subjective:   Nausea on HD. No other complaints.   Objective Filed Vitals:   08/12/14 0800 08/12/14 0830 08/12/14 0900 08/12/14 0935  BP: 135/93 129/75 114/68 97/69  Pulse: 103 105 110 108  Temp:      TempSrc:      Resp:      Height:      Weight:      SpO2:       Physical Exam General: alert and oriented. No acute distress  Heart: RRR Lungs: CTA, unlabored  Abdomen: soft, obese. nontender +BS Extremities: no edema Dialysis Access: L AVF patent on HD  Dialysis Orders: MWF @ [facility]  4 hr 109kgs 2K/2.25ca 9800 Heparin L AVF. 400/800 Calcitriol 2.25 mcg Aranesp 25q qweek Venofe 50 q week  Assessment/Plan: 1. Chest pain- denies today. per primary. EKG SR. Troponin negative. likely musculoskeletal 2. Dysuria- check UA 3. ESRD - MWF [facility]. HD now, uf goal 5600. Is going to need a new outpt center as she can no longer get transportation to [place] from [place]. History of noncomplaince. K+4.5. Needs new HD unit, CM contacting [name] unit. Pt now lives in [place]. 4. Hypertension/volume - 97/69. wts variable- cannot get standing wts. Was not taking any BP meds- norvasc. Mild vascular congestion on chest xray.  5. Anemia - hgb 12.1- hold aranesp 25 and watch CBC, cont Fe q week.  6. Metabolic bone disease - Ca+ 8.7, cont calcitriol. Has not been taking sensipar but last PTH 788, phos 5.9-continue sensipar and renvela 7. Nutrition - renavit. Last alb 4. Will need renal diet. 8. Anxiety- per primary, has been on lorazepam in the past.  9. HIV- per primary, not currently on meds at home 10. Dispo- per notes medical director in [place] said he will never take pt back to that unit. She [place] get transportation to [place].  Jetty DuhamelBridget Whelan, NP [facility] [phone] 08/12/2014,9:49 AM  LOS: 2 days   Pt seen, examined, agree w assess/plan as above with additions as indicated.  Vinson Moselleob Kourtlynn Trevor MD pager [phone]     cell [phone] 08/12/2014, 10:45 AM     Additional Objective Labs: Basic Metabolic Panel:  Recent Labs Lab 08/11/14 0230 08/11/14 1507 08/12/14 0743  NA 138 138 136*  K 6.7* 5.3 4.5  CL 93* 94* 94*  CO2 23 26 26   GLUCOSE 80 107* 102*  BUN 82* 35* 19  CREATININE 12.51* 7.33* 5.10*  CALCIUM 8.2* 8.2* 8.7   Liver Function Tests: No results for input(s): AST, ALT, ALKPHOS, BILITOT, PROT, ALBUMIN in the last 168 hours. No results for input(s): LIPASE, AMYLASE in the last 168 hours. CBC:  Recent Labs Lab 08/10/14 1422 08/11/14 0230 08/12/14 0500  WBC 10.7* 13.8* 8.3  HGB 11.1* 11.0* 12.1  HCT 35.0* 34.0* 36.7  MCV 108.4* 105.3* 104.9*  PLT 224 233 240   Blood Culture No results found for: SDES, SPECREQUEST, CULT, REPTSTATUS  Cardiac Enzymes:  Recent Labs Lab 08/10/14 1843 08/11/14 0100  TROPONINI <0.30 <0.30   CBG:  Recent Labs Lab 08/10/14 1759 08/10/14 1923 08/10/14 1947 08/10/14 2149  GLUCAP 132* 47* 104* 200*   Iron Studies: No results for input(s): IRON, TIBC, TRANSFERRIN, FERRITIN in the last 72 hours. @lablastinr3 @ Studies/Results: Dg Chest 2 View  08/10/2014   CLINICAL DATA:  Left-sided chest pain and shortness of breath.  EXAM: CHEST  2 VIEW  COMPARISON:  07/2009 15.  FINDINGS: There is borderline increased cardiac size.  Tortuous aorta. Mild vascular congestion. Slight platelike atelectasis LEFT midlung zone, versus scarring. No edema or consolidation. No osseous findings. Similar appearance to priors.  IMPRESSION: Stable chest.   Electronically Signed   By: Davonna BellingJohn  Curnes M.D.   On: 08/10/2014 14:12   Medications:   . amLODipine  10 mg Oral QHS  . calcitRIOL  2 mcg Oral Q M,W,F-HD  . cinacalcet  90 mg Oral Q breakfast  . [START ON 08/13/2014] colchicine  0.3 mg Oral Once per day on Mon Thu  . darbepoetin (ARANESP) injection - DIALYSIS  25 mcg Intravenous Q Wed-HD  . ferric gluconate (FERRLECIT/NULECIT) IV  62.5 mg Intravenous Q Wed-HD  .  FLUoxetine  20 mg Oral Daily  . heparin  5,000 Units Subcutaneous 3 times per day  . hydrALAZINE  25 mg Oral 3 times per day  . isosorbide mononitrate  30 mg Oral Daily  . lamiVUDine  25 mg Oral Daily  . multivitamin  1 tablet Oral QHS  . pantoprazole  40 mg Oral Daily  . raltegravir  400 mg Oral BID  . sevelamer carbonate  2,400 mg Oral TID WC  . sodium chloride  3 mL Intravenous Q12H  . sodium chloride  3 mL Intravenous Q12H  . [START ON 08/14/2014] tenofovir  300 mg Oral Q Fri

## 2014-08-12 NOTE — Plan of Care (Signed)
Problem: Phase II Progression Outcomes Goal: Pain controlled Outcome: Completed/Met Date Met:  08/12/14 Goal: Tolerating diet Outcome: Completed/Met Date Met:  08/12/14 Goal: Dyspnea controlled with activity Outcome: Completed/Met Date Met:  08/12/14  Problem: Phase III Progression Outcomes Goal: Pain controlled on oral analgesia Outcome: Completed/Met Date Met:  08/12/14 Goal: Tolerating diet Outcome: Completed/Met Date Met:  08/12/14 Goal: Hemodynamically stable Outcome: Completed/Met Date Met:  08/12/14 Goal: Dyspnea controlled with activity Outcome: Completed/Met Date Met:  08/12/14

## 2014-08-13 LAB — T-HELPER CELLS (CD4) COUNT (NOT AT ARMC)
CD4 % Helper T Cell: 43 % (ref 33–55)
CD4 T Cell Abs: 1360 /uL (ref 400–2700)

## 2014-08-13 MED ORDER — TUBERCULIN PPD 5 UNIT/0.1ML ID SOLN
5.0000 [IU] | Freq: Once | INTRADERMAL | Status: AC
  Administered 2014-08-13: 5 [IU] via INTRADERMAL
  Filled 2014-08-13: qty 0.1

## 2014-08-13 MED ORDER — PHENOL 1.4 % MT LIQD
1.0000 | OROMUCOSAL | Status: DC | PRN
  Administered 2014-08-13: 1 via OROMUCOSAL
  Filled 2014-08-13: qty 177

## 2014-08-13 NOTE — Clinical Social Work Psychosocial (Signed)
Clinical Social Work Department BRIEF PSYCHOSOCIAL ASSESSMENT 08/13/2014  Patient:  Jeralene HuffLSTON,Lonni ANN     Account Number:  [id number]     Admit date:  08/10/2014  Clinical Social Worker:  Merlyn LotHOLOMAN,Kasiyah Platter, CLINICAL SOCIAL WORKER  Date/Time:  08/13/2014 10:00 AM  Referred by:  Physician  Date Referred:  08/13/2014 Referred for  SNF Placement   Other Referral:   Interview type:  Patient Other interview type:    PSYCHOSOCIAL DATA Living Status:  FAMILY Admitted from facility:   Level of care:   Primary support name:  Levern Primary support relationship to patient:  CHILD, ADULT Degree of support available:   Patient has been living with [place] in [place]    CURRENT CONCERNS Current Concerns  Post-Acute Placement   Other Concerns:    SOCIAL WORK ASSESSMENT / PLAN CSW spoke with patient concerning temporary SNF placement while waiting for her dialysis center to be changed. Patient is agreeable to SNF placement for a few weeks but does not want to be in a facility during the holidays.   Assessment/plan status:  Psychosocial Support/Ongoing Assessment of Needs Other assessment/ plan:   fl2 update   Information/referral to community resources[place] [facility]    PATIENT'S/FAMILY'S RESPONSE TO PLAN OF CARE: Patient is agreeable to SNF placement on short term basis       Merlyn LotJenna Holoman, [facility] Clinical Social Worker [phone]

## 2014-08-13 NOTE — Progress Notes (Signed)
Tuberculin injection administered on the right forearm and circled with the marker.  Needs to be read on 08/15/14.  Patient tolerated good.  Yellow administration record sticker is in the bedside drawer.

## 2014-08-13 NOTE — Progress Notes (Addendum)
Subjective:   NO complaints  Objective Filed Vitals:   08/12/14 1223 08/12/14 1819 08/12/14 2350 08/13/14 0525  BP: 106/64 99/76 139/81 116/77  Pulse: 90 100 103 109  Temp: 98.4 F (36.9 C) 98.7 F (37.1 C) 98.9 F (37.2 C) 98.6 F (37 C)  TempSrc: Oral Oral Oral Oral  Resp: 20 20 18 18   Height:      Weight:      SpO2: 96% 96% 100% 99%   Physical Exam General: alert and oriented. No acute distress  Heart: RRR Lungs: CTA, unlabored  Abdomen: soft, obese. nontender +BS Extremities: no edema Dialysis Access: L AVF patent on HD  Dialysis Orders: MWF @ [facility]  4 hr 109kgs 2K/2.25ca 9800 Heparin L AVF. 400/800 Calcitriol 2.25 mcg Aranesp 25q qweek Venofe 50 q week  Assessment: 1. Chest pain- resolved. 2. Dysuria- check UA 3. ESRD - MWF HD. Needs a way to get to HD in [place] or transfer to a new unit.  CM is working on finding a new unit.  4. Hypertension/volume - BP down as vol has come down, d/c hydralazine, cont norvasc for now. Getting close to dry wt.  5. Anemia - hgb 12.1- hold aranesp 25 and watch CBC, cont Fe q week.  6. Metabolic bone disease - Ca+ 8.7, cont calcitriol. Has not been taking sensipar but last PTH 788, phos 5.9-continue sensipar and renvela 7. Nutrition - renavit. Last alb 4. Will need renal diet. 8. Anxiety- per primary, has been on lorazepam in the past.  9. HIV- per primary, not currently on meds at home. Is getting HIV meds here 10. Dispo- [place] and [place] get [place] to [place] from [place] for dialysis reliably. See above.   Plan - HD tomorrow, cont to search for suitable OP HD solution  Vinson Moselleob Matilyn Fehrman MD (pgr) [phone]    [phone] 08/13/2014, 12:02 PM        Additional Objective Labs: Basic Metabolic Panel:  Recent Labs Lab 08/11/14 0230 08/11/14 1507 08/12/14 0743  NA 138 138 136*  K 6.7* 5.3 4.5  CL 93* 94* 94*  CO2 23 26 26   GLUCOSE 80 107* 102*  BUN 82* 35* 19  CREATININE 12.51*  7.33* 5.10*  CALCIUM 8.2* 8.2* 8.7   Liver Function Tests: No results for input(s): AST, ALT, ALKPHOS, BILITOT, PROT, ALBUMIN in the last 168 hours. No results for input(s): LIPASE, AMYLASE in the last 168 hours. CBC:  Recent Labs Lab 08/10/14 1422 08/11/14 0230 08/12/14 0500  WBC 10.7* 13.8* 8.3  HGB 11.1* 11.0* 12.1  HCT 35.0* 34.0* 36.7  MCV 108.4* 105.3* 104.9*  PLT 224 233 240   Blood Culture No results found for: SDES, SPECREQUEST, CULT, REPTSTATUS  Cardiac Enzymes:  Recent Labs Lab 08/10/14 1843 08/11/14 0100  TROPONINI <0.30 <0.30   CBG:  Recent Labs Lab 08/10/14 1759 08/10/14 1923 08/10/14 1947 08/10/14 2149  GLUCAP 132* 47* 104* 200*   Iron Studies: No results for input(s): IRON, TIBC, TRANSFERRIN, FERRITIN in the last 72 hours. @lablastinr3 @ Studies/Results: No results found. Medications:   . amLODipine  10 mg Oral QHS  . calcitRIOL  2 mcg Oral Q M,W,F-HD  . cinacalcet  90 mg Oral Q breakfast  . colchicine  0.3 mg Oral Once per day on Mon Thu  . ferric gluconate (FERRLECIT/NULECIT) IV  62.5 mg Intravenous Q Wed-HD  . FLUoxetine  20 mg Oral Daily  . heparin  5,000 Units Subcutaneous 3 times per day  .  hydrALAZINE  25 mg Oral 3 times per day  . isosorbide mononitrate  30 mg Oral Daily  . lamiVUDine  25 mg Oral Daily  . lidocaine  1 patch Transdermal Q24H  . multivitamin  1 tablet Oral QHS  . pantoprazole  40 mg Oral Daily  . raltegravir  400 mg Oral BID  . sevelamer carbonate  2,400 mg Oral TID WC  . sodium chloride  3 mL Intravenous Q12H  . sodium chloride  3 mL Intravenous Q12H  . [START ON 08/14/2014] tenofovir  300 mg Oral Q Fri

## 2014-08-13 NOTE — Plan of Care (Signed)
Problem: Phase I Progression Outcomes Goal: Initial discharge plan identified Outcome: Completed/Met Date Met:  08/13/14

## 2014-08-13 NOTE — Progress Notes (Signed)
Family Medicine Teaching Service Daily Progress Note Intern Pager: [phone]  Patient name: [name] Medical record number: [id number] Date of birth: 20-Feb-1951 Age: 63 y.o. Gender: female  Primary Care Provider: No PCP Per Patient Consultants: Nephrology Code Status: Partial - DNI  Pt Overview and Major Events to Date:  11/2 - Admit to FPTS for hyperkalemia and emergent HD 11/4 - HD  Assessment and Plan: Jean CooleyMargaret Ann Mcguffin is a 63 y.o. year old female presenting with Hyperkalemia after missing dialysis and chest pain. Her past medical history significant for HIV, ESRD, HTN,tobacco and cocaineabuse, obesity,right hip osteonecrosis  Hyperkalemia (resolved) in setting of ESRD and missed HD: K 6.8 > S/p HD 11/2> K 6.7 while still in HD> 5.3>HD 11/4>4.5. On the previous admission it's clear that she was missing dialysis sessions however she states that her compliance has been good since discharge then.  Unable to find a HD center to take the patient after discharge. - EKG on admission with only slightly peaked T waves, continue telemetry - Nephrology consulted-appreciate recommendations.  - Continue renal vitamins, Aranesp, sevelamer, calcitriol  Chest pain:  Improved. Onset after moving furniture.  Likely musculoskeletal. Troponin neg X 2.  EKG unchanged from previous - Continue K pad, lidoderm patch  Right hip pain:  Known right hip osteonecrosis, she is to follow-up with orthopedic surgery outpatient for this. - Prn pain medications ordered per home regimen, Percocet for moderate pain and Dilaudid PO for severe pain  Leukocytosis: Resolved. Unclear etiology, WBC 10.7>13.8>8.3  HIV infection: Patient has been without antiretrovirals for ~2-3 weeks.  Followed by ID in [place]. - ID signed off - outpt f/u in [facility] in 3-4 wks for repeat VL and CD4 - Continue antiretrovirals - f/u HIV quant and genotype  Hypertension: Hypotensive to 99/76 after HD yesterday. - Continue  to monitor closely, may need to hold an antihypertensive - continue amlodipine, hydralazine, Imdur  Anemia, macrocytic: hemoglobin 11>12.1. 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  Subjective:  Feels well today. No CP, SOB, Pain in L shoulder improved with lidoderm patch.  Feels as though a cold is starting with rare cough and slight sore throat.  Objective: Temp:  [98.2 F (36.8 C)-98.9 F (37.2 C)] 98.6 F (37 C) (11/05 0525) Pulse Rate:  [90-113] 109 (11/05 0525) Resp:  [18-20] 18 (11/05 0525) BP: (99-139)/(63-104) 116/77 mmHg (11/05 0525) SpO2:  [95 %-100 %] 99 % (11/05 0525) Weight:  [244 lb 0.8 oz (110.7 kg)] 244 lb 0.8 oz (110.7 kg) (11/04 1135) Physical Exam: Gen: NAD, alert, cooperative with exam, lying in bed on side HEENT: NCAT, EOMI, MMM CV: RRR, good S1/S2, no murmur Resp: CTABL, no wheezes, non-labored Abd: obese, NT Ext: No edema, warm Neuro: Alert and oriented, EOMI, normal speech Psych: Normal mood and affect  Laboratory:  Recent Labs Lab 08/10/14 1422 08/11/14 0230 08/12/14 0500  WBC 10.7* 13.8* 8.3  HGB 11.1* 11.0* 12.1  HCT 35.0* 34.0* 36.7  PLT  224 233 240    Recent Labs Lab 08/11/14 0230 08/11/14 1507 08/12/14 0743  NA 138 138 136*  K 6.7* 5.3 4.5  CL 93* 94* 94*  CO2 23 26 26   BUN 82* 35* 19  CREATININE 12.51* 7.33* 5.10*  CALCIUM 8.2* 8.2* 8.7  GLUCOSE 80 107* 102*     BNP (last 3 results)  Recent Labs  06/04/14 2008 07/12/14 1249 08/10/14 1422  PROBNP 4928.0* 9434.0* 16099.0*    Troponins neg x2  Imaging/Diagnostic Tests: CXR (11/2): Stable chest.  EKG 08/10/2014: Normal sinus rhythm, slightly more prominent T waves but not overtly peaked   Shirlee LatchAngela Bacigalupo, MD 08/13/2014, 9:42 AM PGY-1, [facility] FPTS Intern pager: [phone], text pages welcome

## 2014-08-13 NOTE — Clinical Social Work Placement (Signed)
Clinical Social Work Department CLINICAL SOCIAL WORK PLACEMENT NOTE 08/13/2014  Patient:  [name],[name]  Account Number:  [id number] Admit date:  08/10/2014  Clinical Social Worker:  Merlyn LotJENNA HOLOMAN, CLINICAL SOCIAL WORKER  Date/time:  08/13/2014 03:42 PM  Clinical Social Work is seeking post-discharge placement for this patient at the following level of care:   SKILLED NURSING   (*CSW will update this form in Epic as items are completed)   08/13/2014  Patient/family provided with [facility] Department of Clinical Social Work's list of facilities offering this level of care within the geographic area requested by the patient (or if unable, by the patient's family).  08/13/2014  Patient/family informed of their freedom to choose among providers that offer the needed level of care, that participate in Medicare, Medicaid or managed care program needed by the patient, have an available bed and are willing to accept the patient.  08/13/2014  Patient/family informed of [facility]' ownership interest in [facility], as well as of the fact that they are under no obligation to receive care at this facility.  PASARR submitted to EDS on 08/13/2014 PASARR number received on 08/13/2014  FL2 transmitted to all facilities in geographic area requested by pt/family on  08/13/2014 FL2 transmitted to all facilities within larger geographic area on   Patient informed that his/her managed care company has contracts with or will negotiate with  certain facilities, including the following:     Patient/family informed of bed offers received:   Patient chooses bed at  Physician recommends and patient chooses bed at    Patient to be transferred to  on   Patient to be transferred to facility by  Patient and family notified of transfer on  Name of family member notified:    The following physician request were entered in Epic:   Additional Comments:   Merlyn LotJenna Holoman, [facility] Clinical  Social Worker [phone]

## 2014-08-14 LAB — GLUCOSE, CAPILLARY: Glucose-Capillary: 207 mg/dL — ABNORMAL HIGH (ref 70–99)

## 2014-08-14 LAB — RENAL FUNCTION PANEL
ALBUMIN: 3.9 g/dL (ref 3.5–5.2)
Anion gap: 23 — ABNORMAL HIGH (ref 5–15)
BUN: 44 mg/dL — AB (ref 6–23)
CO2: 22 mEq/L (ref 19–32)
Calcium: 8.8 mg/dL (ref 8.4–10.5)
Chloride: 92 mEq/L — ABNORMAL LOW (ref 96–112)
Creatinine, Ser: 8.24 mg/dL — ABNORMAL HIGH (ref 0.50–1.10)
GFR calc Af Amer: 5 mL/min — ABNORMAL LOW (ref >90)
GFR calc non Af Amer: 5 mL/min — ABNORMAL LOW (ref >90)
Glucose, Bld: 91 mg/dL (ref 70–99)
PHOSPHORUS: 5.6 mg/dL — AB (ref 2.3–4.6)
Potassium: 5.2 mEq/L (ref 3.7–5.3)
SODIUM: 137 meq/L (ref 137–147)

## 2014-08-14 LAB — CBC
HCT: 37.5 % (ref 36.0–46.0)
Hemoglobin: 12.3 g/dL (ref 12.0–15.0)
MCH: 34.5 pg — ABNORMAL HIGH (ref 26.0–34.0)
MCHC: 32.8 g/dL (ref 30.0–36.0)
MCV: 105 fL — ABNORMAL HIGH (ref 78.0–100.0)
Platelets: 247 10*3/uL (ref 150–400)
RBC: 3.57 MIL/uL — ABNORMAL LOW (ref 3.87–5.11)
RDW: 14.6 % (ref 11.5–15.5)
WBC: 9.5 10*3/uL (ref 4.0–10.5)

## 2014-08-14 LAB — BASIC METABOLIC PANEL
Anion gap: 21 — ABNORMAL HIGH (ref 5–15)
BUN: 43 mg/dL — ABNORMAL HIGH (ref 6–23)
CO2: 23 mEq/L (ref 19–32)
CREATININE: 8.18 mg/dL — AB (ref 0.50–1.10)
Calcium: 8.6 mg/dL (ref 8.4–10.5)
Chloride: 92 mEq/L — ABNORMAL LOW (ref 96–112)
GFR calc Af Amer: 5 mL/min — ABNORMAL LOW (ref >90)
GFR, EST NON AFRICAN AMERICAN: 5 mL/min — AB (ref >90)
Glucose, Bld: 94 mg/dL (ref 70–99)
Potassium: 5.1 mEq/L (ref 3.7–5.3)
Sodium: 136 mEq/L — ABNORMAL LOW (ref 137–147)

## 2014-08-14 LAB — HIV-1 RNA ULTRAQUANT REFLEX TO GENTYP+: HIV 1 RNA Quant: <20 copies/mL (ref <20)

## 2014-08-14 MED ORDER — PENTAFLUOROPROP-TETRAFLUOROETH EX AERO: 1.0000 "application " | INHALATION_SPRAY | CUTANEOUS | Status: DC | PRN

## 2014-08-14 MED ORDER — HEPARIN SODIUM (PORCINE) 1000 UNIT/ML DIALYSIS
1000.0000 [IU] | INTRAMUSCULAR | Status: DC | PRN
  Filled 2014-08-14: qty 1

## 2014-08-14 MED ORDER — LIDOCAINE HCL (PF) 1 % IJ SOLN: 5.0000 mL | INTRAMUSCULAR | Status: DC | PRN

## 2014-08-14 MED ORDER — TENOFOVIR DISOPROXIL FUMARATE 300 MG PO TABS: 300.0000 mg | ORAL_TABLET | ORAL | Status: DC

## 2014-08-14 MED ORDER — SODIUM CHLORIDE 0.9 % IV SOLN: 100.0000 mL | INTRAVENOUS | Status: DC | PRN

## 2014-08-14 MED ORDER — ONDANSETRON HCL 4 MG/2ML IJ SOLN
4.0000 mg | Freq: Once | INTRAMUSCULAR | Status: AC
Start: 2014-08-14 — End: 2014-08-14
  Administered 2014-08-14: 4 mg via INTRAMUSCULAR
  Filled 2014-08-14: qty 2

## 2014-08-14 MED ORDER — LIDOCAINE-PRILOCAINE 2.5-2.5 % EX CREA: 1.0000 "application " | TOPICAL_CREAM | CUTANEOUS | Status: DC | PRN

## 2014-08-14 MED ORDER — LAMIVUDINE 10 MG/ML PO SOLN: 25.0000 mg | Freq: Every day | ORAL | Status: DC

## 2014-08-14 MED ORDER — HEPARIN SODIUM (PORCINE) 1000 UNIT/ML DIALYSIS
4500.0000 [IU] | INTRAMUSCULAR | Status: DC | PRN
  Filled 2014-08-14: qty 5

## 2014-08-14 MED ORDER — LIDOCAINE 5 % EX PTCH: 1.0000 | MEDICATED_PATCH | CUTANEOUS | Status: DC

## 2014-08-14 MED ORDER — RALTEGRAVIR POTASSIUM 400 MG PO TABS: 400.0000 mg | ORAL_TABLET | Freq: Two times a day (BID) | ORAL | Status: DC

## 2014-08-14 MED ORDER — NEPRO/CARBSTEADY PO LIQD: 237.0000 mL | ORAL | Status: DC | PRN

## 2014-08-14 MED ORDER — ALTEPLASE 2 MG IJ SOLR
2.0000 mg | Freq: Once | INTRAMUSCULAR | Status: DC | PRN
  Filled 2014-08-14: qty 2

## 2014-08-14 MED ORDER — SEVELAMER CARBONATE 800 MG PO TABS: 2400.0000 mg | ORAL_TABLET | Freq: Three times a day (TID) | ORAL | Status: DC

## 2014-08-14 NOTE — Progress Notes (Signed)
Family Medicine Teaching Service Daily Progress Note Intern Pager: [phone]  Patient name: [name] Medical record number: [id number] Date of birth: October 24, 1950 Age: 63 y.o. Gender: female  Primary Care Provider: No PCP Per Patient Consultants: Nephrology Code Status: Partial - DNI  Pt Overview and Major Events to Date:  11/2 - Admit to FPTS for hyperkalemia and emergent HD 11/4 - HD 11/6 - HD  Assessment and Plan:   Jean CooleyMargaret Ann [name] is a 63 y.o. year old female presenting with Hyperkalemia after missing dialysis and chest pain. Her past medical history significant for HIV, ESRD, HTN,tobacco and cocaineabuse, obesity,right hip osteonecrosis  Hyperkalemia (resolved) in setting of ESRD and missed HD: K 6.8 > S/p HD 11/2> K 6.7 while still in HD> 5.3>HD 11/4>4.5. On the previous admission it's clear that she was missing dialysis sessions however she states that her compliance has been good since discharge then.  Unable to find a HD center to take the patient after discharge. - EKG on admission with only slightly peaked T waves, continue telemetry - Nephrology consulted-appreciate recommendations.  - Continue renal vitamins, Aranesp, sevelamer, calcitriol  Chest pain:  Improved. Onset after moving furniture.  Likely musculoskeletal. Troponin neg X 2.  EKG unchanged from previous - Continue K pad, lidoderm patch  Right hip pain:  Known right hip osteonecrosis, she is to follow-up with orthopedic surgery outpatient for this. - Prn pain medications ordered per home regimen, Percocet for moderate pain and Dilaudid PO for severe pain  Leukocytosis: Resolved. Unclear etiology, WBC 10.7>13.8>8.3  HIV infection: Patient has been without antiretrovirals for ~2-3 weeks.  Followed by ID in [place]. - ID signed off - outpt f/u in [facility] clinic in 3-4 wks for repeat VL and CD4 - Continue antiretrovirals - f/u HIV quant and genotype  Hypertension: BP currently well controlled. - Continue  to monitor - continue home amlodipine, hydralazine, Imdur  Anemia, macrocytic: hemoglobin 11>12.1. 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  Patient now reporting that she can live with her niece in [place] and continue to attend [facility] - will need ramp though.  Subjective:  Feels well today. No CP, SOB, Pain in L shoulder improved with lidoderm patch.  Chloraseptic spray helped sore throat.  Objective: Temp:  [98.1 F (36.7 C)-99.1 F (37.3 C)] 98.4 F (36.9 C) (11/06 0755) Pulse Rate:  [96-114] 114 (11/06 0930) Resp:  [16-23] 23 (11/06 0930) BP: (111-142)/(65-99) 119/77 mmHg (11/06 0930) SpO2:  [98 %-100 %] 100 % (11/06 0802) Weight:  [244 lb 11.4 oz (111 kg)] 244 lb 11.4 oz (111 kg) (11/06 0755) Physical Exam: Gen: NAD, alert, cooperative with exam, lying in HD HEENT: NCAT, EOMI, MMM CV: RRR, good S1/S2, no murmur Resp: CTABL, no wheezes, non-labored Abd: obese, NT Ext: No edema, warm Neuro: Alert and oriented, EOMI, normal speech Psych: Normal mood and affect  Laboratory:  Recent Labs Lab 08/11/14  0230 08/12/14 0500 08/14/14 0757  WBC 13.8* 8.3 9.5  HGB 11.0* 12.1 12.3  HCT 34.0* 36.7 37.5  PLT 233 240 247    Recent Labs Lab 08/11/14 0230 08/11/14 1507 08/12/14 0743  NA 138 138 136*  K 6.7* 5.3 4.5  CL  93* 94* 94*  CO2 23 26 26   BUN 82* 35* 19  CREATININE 12.51* 7.33* 5.10*  CALCIUM 8.2* 8.2* 8.7  GLUCOSE 80 107* 102*    BNP (last 3 results)  Recent Labs  06/04/14 2008 07/12/14 1249 08/10/14 1422  PROBNP 4928.0* 9434.0* 16099.0*    Troponins neg x2  Imaging/Diagnostic Tests: CXR (11/2): Stable chest.  EKG 08/10/2014: Normal sinus rhythm, slightly more prominent T waves but not overtly peaked   Shirlee LatchAngela Bacigalupo, MD 08/14/2014, 9:40 AM PGY-1, [facility] FPTS Intern pager: [phone], text pages welcome

## 2014-08-14 NOTE — Care Management Note (Signed)
CARE MANAGEMENT NOTE 08/14/2014  Patient:  [name],[name]   Account Number:  [id number]  Date Initiated:  08/11/2014  Documentation initiated by:  Ladarious Kresse  Subjective/Objective Assessment:   CM following for progession and d/c planning.     Action/Plan:   Per pt Renal MD, this pt needs to be placed in Out[name] [place], per pt wishes. Call placed to that facility and await response.   Anticipated DC Date:     Anticipated DC Plan:  HOME/SELF CARE         Choice offered to / List presented to:             Status of service:  In process, will continue to follow Medicare Important Message given?   (If response is "NO", the following Medicare IM given date fields will be blank) Date Medicare IM given:   Medicare IM given by:   Date Additional Medicare IM given:   Additional Medicare IM given by:    Discharge Disposition:    Per UR Regulation:    If discussed at Long Length of Stay Meetings, dates discussed:    Comments:  08/14/2014 Ongoing efforts to place this pt at [name]. After multiple calls and messages left this CM was finally able to speak with [name] of Central Intake for the [name]. She agreed to fax the list of information needed for [name] to be reviewed for possible admission. When the list had not arrived by midday, this CM contacted Ms [name] [name] and she was able to email the information. With the assistance of the HD [name] secretary , [name], change nurse [name] and the collection of data the infor was assembled and labs ordered that are required by this [name]. This CM also contacted pts current HD [name] on [address], [place], [place] and requested the pt plan of care, and other info. [name] , of that [name] asked for the list to be faxed and has agreed to send every thing that the [name] has that may be helpful. Currently awaiting info from the HD [name] ( spoke with [name])  and labs drawn  [name] and will be able to fax all info hopefully at the beginning of the week. In the mean time, the CSW is working with this pt on short term placement her in [name], so that she may continue to get HD as an outpatient here in [name] until the [name] is able to give us an answer. Will fax all info to Central Intake for consideration when info is received [facility] [address] [name]. [name], case manager, [phone]  08/11/2014 Call placed to nearest HD [name] in [name], [place] , central intake , [name] @ [phone] and message left re pt and HD needs. Will fax info as soon as call is returned. Fax # [phone].  Will continue to follow and provide info to Central Intake for [name] central intake.    [name], case manager, [phone]

## 2014-08-14 NOTE — Clinical Social Work Note (Signed)
CSW spoke with patient concerning SNF placement- patient is no longer agreeable to SNF and would like to stay with her niece in [place] and continue getting dialysis in [place] until [place] [facility] [facility] [place].  Patient stated that she feels comfortable staying at her nieces house and reported that there are only 3 steps leading up to her nieces apartment.  CSW informed Dr of patient disposition.  CSW signing off.  Merlyn LotJenna Holoman, LCSWA Clinical Social Worker [phone]

## 2014-08-14 NOTE — Progress Notes (Signed)
Subjective:   NO complaints  Objective Filed Vitals:   08/14/14 0809 08/14/14 0834 08/14/14 0900 08/14/14 0930  BP: 135/71 136/74 111/89 119/77  Pulse: 96 109 106 114  Temp:      TempSrc:      Resp: 20 18 19 23   Height:      Weight:      SpO2:       Physical Exam General: alert and oriented. No acute distress  Heart: RRR Lungs: CTA, unlabored  Abdomen: soft, obese. nontender +BS Extremities: no edema Dialysis Access: L AVF patent on HD  Dialysis Orders: MWF @ [facility]  4 hr 109kgs 2K/2.25ca 9800 Heparin L AVF. 400/800 Calcitriol 2.25 mcg Aranesp 25q qweek Venofe 50 q week  Assessment: 1. Chest pain- resolved 2. ESRD - MWF HD. Needs a way to get to HD in [place] or transfer to a new unit.  CM is working on finding a new unit.  3. Hypertension/volume - BP down as vol has come down, d/c hydralazine, cont norvasc for now. Getting close to dry wt.  4. Anemia - hgb 12.1- hold aranesp 25 and watch CBC, cont Fe q week.  5. Metabolic bone disease - Ca+ 8.7, cont calcitriol. Has not been taking sensipar but last PTH 788, phos 5.9-continue sensipar and renvela 6. Nutrition - renavit. Last alb 4. Will need renal diet. 7. Anxiety- per primary, has been on lorazepam in the past.  8. HIV- per primary, not currently on meds at home. Is getting HIV meds here 9. Dispo- [place] back [place] and cannot get transportation to [place] from [place] for dialysis reliably. See above.   Plan - HD today.  Cont to search for suitable OP HD solution  Vinson Moselleob Daphanie Oquendo MD (pgr) [phone]    ([phone]) [phone] 08/14/2014, 9:54 AM        Additional Objective Labs: Basic Metabolic Panel:  Recent Labs Lab 08/11/14 0230 08/11/14 1507 08/12/14 0743  NA 138 138 136*  K 6.7* 5.3 4.5  CL 93* 94* 94*  CO2 23 26 26   GLUCOSE 80 107* 102*  BUN 82* 35* 19  CREATININE 12.51* 7.33* 5.10*  CALCIUM 8.2* 8.2* 8.7   Liver Function Tests: No results for input(s): AST, ALT, ALKPHOS, BILITOT, PROT,  ALBUMIN in the last 168 hours. No results for input(s): LIPASE, AMYLASE in the last 168 hours. CBC:  Recent Labs Lab 08/10/14 1422 08/11/14 0230 08/12/14 0500 08/14/14 0757  WBC 10.7* 13.8* 8.3 9.5  HGB 11.1* 11.0* 12.1 12.3  HCT 35.0* 34.0* 36.7 37.5  MCV 108.4* 105.3* 104.9* 105.0*  PLT 224 233 240 247   Blood Culture No results found for: SDES, SPECREQUEST, CULT, REPTSTATUS  Cardiac Enzymes:  Recent Labs Lab 08/10/14 1843 08/11/14 0100  TROPONINI <0.30 <0.30   CBG:  Recent Labs Lab 08/10/14 1759 08/10/14 1923 08/10/14 1947 08/10/14 2149  GLUCAP 132* 47* 104* 200*   Iron Studies: No results for input(s): IRON, TIBC, TRANSFERRIN, FERRITIN in the last 72 hours. @lablastinr3 @ Studies/Results: No results found. Medications:   . amLODipine  10 mg Oral QHS  . calcitRIOL  2 mcg Oral Q M,W,F-HD  . cinacalcet  90 mg Oral Q breakfast  . colchicine  0.3 mg Oral Once per day on Mon Thu  . ferric gluconate (FERRLECIT/NULECIT) IV  62.5 mg Intravenous Q Wed-HD  . FLUoxetine  20 mg Oral Daily  . heparin  5,000 Units Subcutaneous 3 times per day  . isosorbide mononitrate  30 mg Oral Daily  .  lamiVUDine  25 mg Oral Daily  . lidocaine  1 patch Transdermal Q24H  . multivitamin  1 tablet Oral QHS  . pantoprazole  40 mg Oral Daily  . raltegravir  400 mg Oral BID  . sevelamer carbonate  2,400 mg Oral TID WC  . sodium chloride  3 mL Intravenous Q12H  . sodium chloride  3 mL Intravenous Q12H  . tenofovir  300 mg Oral Q Fri  . tuberculin  5 Units Intradermal Once

## 2014-08-14 NOTE — Procedures (Signed)
I was present at this dialysis session, have reviewed the session itself and made  appropriate changes  Rob Laveyah Oriol MD (pgr) [phone]    (c) [phone] 08/14/2014, 9:56 AM   

## 2014-08-14 NOTE — Discharge Instructions (Signed)
You were admitted for high potassium after a missed dialysis.  This resolved after dialysis.  Hyperkalemia Hyperkalemia is when you have too much potassium in your blood. This can be a life-threatening condition. Potassium is normally removed (excreted) from the body by the kidneys. CAUSES  The potassium level in your body can become too high for the following reasons:  You take in too much potassium. You can do this by:  Using salt substitutes. They contain large amounts of potassium.  Taking potassium supplements from your caregiver. The dose may be too high for you.  Eating foods or taking nutritional products with potassium.  You excrete too little potassium. This can happen if:  Your kidneys are not functioning properly. Kidney (renal) disease is a very common cause of hyperkalemia.  You are taking medicines that lower your excretion of potassium, such as certain diuretic medicines.  You have an adrenal gland disease called Addison's disease.  You have a urinary tract obstruction, such as kidney stones.  You are on treatment to mechanically clean your blood (dialysis) and you skip a treatment.  You release a high amount of potassium from your cells into your blood. You may have a condition that causes potassium to move from your cells to your bloodstream. This can happen with:  Injury to muscles or other tissues. Most potassium is stored in the muscles.  Severe burns or infections.  Acidic blood plasma (acidosis). Acidosis can result from many diseases, such as uncontrolled diabetes. SYMPTOMS  Usually, there are no symptoms unless the potassium is dangerously high or has risen very quickly. Symptoms may include:  Irregular or very slow heartbeat.  Feeling sick to your stomach (nauseous).  Tiredness (fatigue).  Nerve problems such as tingling of the skin, numbness of the hands or feet, weakness, or paralysis. DIAGNOSIS  A simple blood test can measure the amount of  potassium in your body. An electrocardiogram test of the heart can also help make the diagnosis. The heart may beat dangerously fast or slow down and stop beating with severe hyperkalemia.  TREATMENT  Treatment depends on how bad the condition is and on the underlying cause.  If the hyperkalemia is an emergency (causing heart problems or paralysis), many different medicines can be used alone or together to lower the potassium level briefly. This may include an insulin injection even if you are not diabetic. Emergency dialysis may be needed to remove potassium from the body.  If the hyperkalemia is less severe or dangerous, the underlying cause is treated. This can include taking medicines if needed. Your prescription medicines may be changed. You may also need to take a medicine to help your body get rid of potassium. You may need to eat a diet low in potassium. HOME CARE INSTRUCTIONS   Take medicines and supplements as directed by your caregiver.  Do not take any over-the-counter medicines, supplements, natural products, herbs, or vitamins without reviewing them with your caregiver. Certain supplements and natural food products can have high amounts of potassium. Other products (such as ibuprofen) can damage weak kidneys and raise your potassium.  You may be asked to do repeat lab tests. Be sure to follow these directions.  If you have kidney disease, you may need to follow a low potassium diet. SEEK MEDICAL CARE IF:   You notice an irregular or very slow heartbeat.  You feel lightheaded.  You develop weakness that is unusual for you. SEEK IMMEDIATE MEDICAL CARE IF:   You have shortness of breath.  You have chest discomfort.  You pass out (faint). MAKE SURE YOU:   Understand these instructions.  Will watch your condition.  Will get help right away if you are not doing well or get worse. Document Released: 09/15/2002 Document Revised: 12/18/2011 Document Reviewed:  12/31/2013 [facility] Patient Information 2015 [employer] [place]. This information is not intended to replace advice given to you by your health care provider. Make sure you discuss any questions you have with your health care provider.

## 2014-08-14 NOTE — Plan of Care (Signed)
Problem: Phase I Progression Outcomes Goal: Pain controlled with appropriate interventions Outcome: Completed/Met Date Met:  08/14/14 Pt's pain controlled with repositioning and PRN medications.

## 2014-08-14 NOTE — Progress Notes (Signed)
Patient was sweaty and complaining of hot in the room and informed the nurse that she was weak and couldn't see anything for a second, given Ativan.  Her VS all were normal and also checked her CBG per patient request which was 207.  She was also c/o nausea called the physician for the order.  Given zofran, when asked the patient what happened, she said she was getting ready to dress up for discharge and said "I guess I am too weak to leave". Patient told this nurse that she was not ready to get discharged.  Informed the physician about the patient's decision.  D/c the discharge order.  Oncoming nurse made aware of the situation.

## 2014-08-15 LAB — GLUCOSE, CAPILLARY: Glucose-Capillary: 148 mg/dL — ABNORMAL HIGH (ref 70–99)

## 2014-08-15 NOTE — Progress Notes (Signed)
Subjective:   NO complaints  Objective Filed Vitals:   08/14/14 1753 08/14/14 1900 08/14/14 2020 08/15/14 0520  BP: 89/56 96/66 108/51 121/72  Pulse: 90 72 95 98  Temp: 98.9 F (37.2 C)   98.6 F (37 C)  TempSrc: Oral     Resp: 18  18 18   Height:      Weight:   108.4 kg (238 lb 15.7 oz)   SpO2: 96%  99% 94%   Physical Exam General: alert and oriented. No acute distress  Heart: RRR Lungs: CTA, unlabored  Abdomen: soft, obese. nontender +BS Extremities: no edema Dialysis Access: L AVF patent on HD  Dialysis Orders: MWF @ [facility]  4 hr 109kgs 2K/2.25ca 9800 Heparin L AVF. 400/800 Calcitriol 2.25 mcg Aranesp 25q qweek Venofe 50 q week  Assessment: 1. Chest pain- resolved 2. ESRD - MWF HD. Needs a way to get to HD in [place] or transfer to a new unit.  CM is working on finding a new unit. No word yet.  3. Hypertension/volume - BP's soft, slightly below dry wt, stop norvasc  4. Anemia - hgb 12's- holding aranesp 25 and watch CBC, cont Fe q week.  5. Metabolic bone disease - Ca+ 8.7, cont calcitriol. Has not been taking sensipar but last PTH 788, phos 5.9-continue sensipar and renvela 6. Nutrition - renavit. Last alb 4 7. HIV- per primary, not currently on meds at home. Is getting HIV meds here 8. Dispo- [place] and [place] to [place] from [place] for dialysis reliably. See above.   Plan - HD Monday  Vinson Moselleob Kristel Durkee MD (pgr) [phone]    ([phone] 08/15/2014, 12:22 PM        Additional Objective Labs: Basic Metabolic Panel:  Recent Labs Lab 08/12/14 0743 08/14/14 0757 08/14/14 0804  NA 136* 136* 137  K 4.5 5.1 5.2  CL 94* 92* 92*  CO2 26 23 22   GLUCOSE 102* 94 91  BUN 19 43* 44*  CREATININE 5.10* 8.18* 8.24*  CALCIUM 8.7 8.6 8.8  PHOS  --   --  5.6*   Liver Function Tests:  Recent Labs Lab 08/14/14 0804  ALBUMIN 3.9   No results for input(s): LIPASE, AMYLASE in the last 168 hours. CBC:  Recent  Labs Lab 08/10/14 1422 08/11/14 0230 08/12/14 0500 08/14/14 0757  WBC 10.7* 13.8* 8.3 9.5  HGB 11.1* 11.0* 12.1 12.3  HCT 35.0* 34.0* 36.7 37.5  MCV 108.4* 105.3* 104.9* 105.0*  PLT 224 233 240 247   Blood Culture No results found for: SDES, SPECREQUEST, CULT, REPTSTATUS  Cardiac Enzymes:  Recent Labs Lab 08/10/14 1843 08/11/14 0100  TROPONINI <0.30 <0.30   CBG:  Recent Labs Lab 08/10/14 1759 08/10/14 1923 08/10/14 1947 08/10/14 2149 08/14/14 1500  GLUCAP 132* 47* 104* 200* 207*   Iron Studies: No results for input(s): IRON, TIBC, TRANSFERRIN, FERRITIN in the last 72 hours. @lablastinr3 @ Studies/Results: No results found. Medications:   . amLODipine  10 mg Oral QHS  . calcitRIOL  2 mcg Oral Q M,W,F-HD  . cinacalcet  90 mg Oral Q breakfast  . colchicine  0.3 mg Oral Once per day on Mon Thu  . ferric gluconate (FERRLECIT/NULECIT) IV  62.5 mg Intravenous Q Wed-HD  . FLUoxetine  20 mg Oral Daily  . heparin  5,000 Units Subcutaneous 3 times per day  . isosorbide mononitrate  30 mg Oral Daily  . lamiVUDine  25 mg Oral Daily  . lidocaine  1  patch Transdermal Q24H  . multivitamin  1 tablet Oral QHS  . pantoprazole  40 mg Oral Daily  . raltegravir  400 mg Oral BID  . sevelamer carbonate  2,400 mg Oral TID WC  . sodium chloride  3 mL Intravenous Q12H  . sodium chloride  3 mL Intravenous Q12H  . tenofovir  300 mg Oral Q Fri  . tuberculin  5 Units Intradermal Once

## 2014-08-15 NOTE — Progress Notes (Signed)
Family Medicine Teaching Service Daily Progress Note Intern Pager: [phone]  Patient name: [name] Medical record number: [id number] Date of birth: [birth year] Age: 63 y.o. Gender: female  Primary Care Provider: No PCP Per Patient Consultants: Nephrology Code Status: Partial - DNI  Pt Overview and Major Events to Date:  11/2 - Admit to FPTS for hyperkalemia and emergent HD 11/4 - HD 11/6 - HD  Assessment and Plan:   [name] is a 63 y.o. year old female presenting with Hyperkalemia after missing dialysis and chest pain. Her past medical history significant for HIV, ESRD, HTN,tobacco and cocaineabuse, obesity,right hip osteonecrosis  Hyperkalemia (resolved) in setting of ESRD and missed HD: K 6.8 > S/p HD 11/2> K 6.7 while still in HD> 5.3>HD 11/4>4.5. On the previous admission it was clear that she was missing dialysis sessions however she states that her compliance has been good since discharge then.   - EKG on admission with only slightly peaked T waves, continue telemetry - Nephrology consulted, pt has been getting HD inpatient - Continue renal vitamins, Aranesp, sevelamer, calcitriol - Has major dispo issues as not able to locate new HD center for patient [facility]([place] refusing her, but she lives there. Possibility of [name]). Pt previously had said she could live with niece in [place], but now stating she cannot do this. Thus no safe dispo option at this time as she [place].  Chest pain:  Improved. Onset after moving furniture.  Likely musculoskeletal. Troponin neg X 2.  EKG unchanged from previous - Continue K pad, lidoderm patch  Right hip pain:  Known right hip osteonecrosis, she is to follow-up with orthopedic surgery outpatient for this. - Prn pain medications ordered per home regimen, Percocet for moderate pain and Dilaudid PO for severe pain  Leukocytosis: Resolved. Unclear etiology, WBC  10.7>13.8>8.3  HIV infection: Patient has been without antiretrovirals for ~2-3 weeks.  Followed by ID in [place]. - ID signed off - outpt f/u in [facility] in 3-4 wks for repeat VL and CD4 - Continue antiretrovirals - CD4 1360, viral load undetectable - had PPD placed on 11/5 at 1753 - needs to be read today or tomorrow  Hypertension: BP currently well controlled. - Continue to monitor - continue home amlodipine, hydralazine, Imdur  Anemia, macrocytic: hemoglobin 11>12.1. 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 No valid dispo options at this point that will ensure ability to get HD once discharged. Will re-consult CSW.  Subjective:  Feels well today. No complaints. States going to live with her niece is no longer an option because the niece has her brother living there and he is a drug user, and pt has hx of drug use and states it wouldn't be good for her to be around that.  Objective: Temp:  [97.5 F (36.4 C)-98.9 F (37.2 C)] 98.6 F (37 C) (11/07 0520) Pulse Rate:  [72-108] 98 (11/07 0520) Resp:  [17-20] 18 (11/07 0520) BP: (89-128)/(51-76) 121/72 mmHg (11/07 0520) SpO2:  [92 %-99 %] 94 % (11/07 0520) Weight:  [238 lb 15.7 oz (108.4 kg)-240  lb 15.4 oz (109.3 kg)] 238 lb 15.7 oz (108.4 kg) (11/06 2020) Physical Exam: Gen: NAD, alert, cooperative with exam, lying in bed HEENT: NCAT CV: RRR, good S1/S2, no murmur Resp: CTABL, no wheezes, non-labored Abd: obese, NT Ext: No edema, warm Neuro: Alert and oriented, EOMI, normal speech Psych: Normal mood and  affect  Laboratory:  Recent Labs Lab 08/11/14 0230 08/12/14 0500 08/14/14 0757  WBC 13.8* 8.3 9.5  HGB 11.0* 12.1 12.3  HCT 34.0* 36.7 37.5  PLT 233 240 247    Recent Labs Lab 08/12/14 0743 08/14/14 0757 08/14/14 0804  NA 136* 136* 137  K 4.5 5.1 5.2  CL 94* 92* 92*  CO2 26 23 22   BUN 19 43* 44*  CREATININE 5.10* 8.18* 8.24*  CALCIUM 8.7 8.6 8.8  GLUCOSE 102* 94 91    BNP (last 3 results)  Recent Labs  06/04/14 2008 07/12/14 1249 08/10/14 1422  PROBNP 4928.0* 9434.0* 16099.0*    Troponins neg x2  Imaging/Diagnostic Tests: CXR (11/2): Stable chest.  EKG 08/10/2014: Normal sinus rhythm, slightly more prominent T waves but not overtly peaked   Latrelle DodrillBrittany J McIntyre, MD 08/15/2014, 10:32 AM PGY-3, [facility] FPTS Intern pager: [phone], text pages welcome

## 2014-08-16 MED ORDER — FLUCONAZOLE 150 MG PO TABS
150.0000 mg | ORAL_TABLET | Freq: Once | ORAL | Status: AC
Start: 2014-08-16 — End: 2014-08-16
  Administered 2014-08-16: 150 mg via ORAL
  Filled 2014-08-16 (×2): qty 1

## 2014-08-16 NOTE — Progress Notes (Signed)
Pt c/o foul odor and discharge in her genital area. Stating that she knows she has an infection, because of the odor. Unable to get urine sample as ordered d/t pt anuric. MD informed of pt concerns. Order received for diflucan x 1. Pt declined to take at this time d/t her stomach hurting, denied nausea. Advised of moving medication to bedtime. Pt agreed. Will continue to monitor. Hortencia ConradiWendi Qunisha Bryk, RN

## 2014-08-16 NOTE — Plan of Care (Signed)
Problem: Phase I Progression Outcomes Goal: Hemodynamically stable Outcome: Completed/Met Date Met:  08/16/14

## 2014-08-16 NOTE — Progress Notes (Signed)
Family Medicine Teaching Service Daily Progress Note Intern Pager: [phone]  Patient name: [name] Medical record number: [id number] Date of birth: December 26, 1950 Age: 63 y.o. Gender: female  Primary Care Provider: No PCP Per Patient Consultants: Nephrology Code Status: Partial - DNI  Pt Overview and Major Events to Date:  11/2 - Admit to FPTS for hyperkalemia and emergent HD 11/4 - HD 11/6 - HD  Assessment and Plan:   [name] is a 63 y.o. year old female presenting with Hyperkalemia after missing dialysis and chest pain. Her past medical history significant for HIV, ESRD, HTN,tobacco and cocaineabuse, obesity,right hip osteonecrosis  Hyperkalemia (resolved) in setting of ESRD and missed HD: Main issue is social - Continue renal vitamins, Aranesp, sevelamer, calcitriol - Has major dispo issues as not able to locate new HD center for patient [facility]([place] refusing her, but she lives there. Possibility of [name]). Pt previously had said she could live with niece in [place], but now stating she cannot do this. Thus no safe dispo option at this time as [place].  Chest pain:  Improved. Onset after moving furniture.  Likely musculoskeletal. Troponin neg X 2.  EKG unchanged from previous - Continue K pad, lidoderm patch  Right hip pain:  Known right hip osteonecrosis, she is to follow-up with orthopedic surgery outpatient for this. - Prn pain medications ordered per home regimen, Percocet for moderate pain and Dilaudid PO for severe pain -Hip XR reviewed on 07/18/14- stable, will attempt to assist outpt f/up   Leukocytosis: Resolved. Unclear etiology, WBC 10.7>13.8>8.3  HIV infection: Patient has been without antiretrovirals for ~2-3 weeks.  Followed by ID in [place]. - ID signed off - outpt f/u in [facility] in 3-4 wks for repeat VL and CD4 - Continue antiretrovirals - CD4 1360, viral load undetectable - had PPD  placed on 11/5 at 1753 - needs to be read  Hypertension: BP currently well controlled. - Continue to monitor - continue home amlodipine, hydralazine, Imdur  Anemia, macrocytic: hemoglobin stable. 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 Attempting to work with SW regarding dispo  Subjective: c/o right hip discomfort; unable to lift off bed   Objective: Temp:  [98 F (36.7 C)-98.6 F (37 C)] 98 F (36.7 C) (11/08 0431) Pulse Rate:  [86-93] 86 (11/08 0431) Resp:  [18] 18 (11/08 0431) BP: (121-142)/(65-72) 142/72 mmHg (11/08 0431) SpO2:  [97 %-99 %] 99 % (11/08 0431) Weight:  [238 lb 15.7 oz (108.401 kg)] 238 lb 15.7 oz (108.401 kg) (11/07 2242) Physical Exam: Gen: NAD, alert, cooperative with exam, lying in bed HEENT: NCAT CV: RRR, good S1/S2, no murmur Resp: CTABL, no wheezes, non-labored Abd: obese, NT Ext: No edema, warm Neuro: Alert and oriented, EOMI, normal speech Psych: Normal mood and affect  Laboratory:  Recent Labs Lab 08/11/14 0230 08/12/14 0500 08/14/14 0757  WBC 13.8* 8.3 9.5  HGB 11.0* 12.1 12.3  HCT 34.0* 36.7 37.5  PLT 233 240 247    Recent Labs Lab 08/12/14 0743 08/14/14 0757 08/14/14 0804  NA 136* 136* 137  K 4.5 5.1 5.2  CL 94* 92* 92*  CO2 26 23 22  rttWciktoGo$ BUN 19 43* 44*  CREATININE 5.10* 8.18* 8.24*  CALCIUM 8.7 8.6 8.8  GLUCOSE 102* 94 91    BNP (last 3 results)  Recent Labs  06/04/14 2008 07/12/14 1249 08/10/14 1422  PROBNP 4928.0* 9434.0* 16099.0*    Troponins neg x2  Imaging/Diagnostic Tests: CXR (11/2): Stable  chest.  EKG 08/10/2014: Normal sinus rhythm, slightly more prominent T waves but not overtly peaked   Charlane FerrettiMelanie C Shandell Giovanni, MD 08/16/2014, 8:55 AM PGY-2, [facility] Family Medicine FPTS Intern pager: [phone], text pages welcome

## 2014-08-16 NOTE — Progress Notes (Signed)
Subjective: co some Hip pain/ "maybe Orthopedic can see me while Im waiting for  kidney center placement"  Objective Vital signs in last 24 hours: Filed Vitals:   08/14/14 2020 08/15/14 0520 08/15/14 2242 08/16/14 0431  BP: 108/51 121/72 121/65 142/72  Pulse: 95 98 93 86  Temp:  98.6 F (37 C) 98.6 F (37 C) 98 F (36.7 C)  TempSrc:   Oral Oral  Resp: 18 18 18 18   Height:      Weight: 108.4 kg (238 lb 15.7 oz)  108.401 kg (238 lb 15.7 oz)   SpO2: 99% 94% 97% 99%   Weight change: -2.599 kg (-5 lb 11.7 oz) Physical Exam General: NAD , OX3 Heart: RRR no rub,mur, gallop Lungs: CTA bilat   Abdomen: +BS,soft, obese. nontender  Extremities: no pedal  edema Dialysis Access: L AVF pos. bruit  Dialysis Orders: MWF @ [facility]  4 hr 109kgs 2K/2.25ca 9800 Heparin L AVF. 400/800 Calcitriol 2.25 mcg Aranesp 25q qweek Venofe 50 q week  Problem/Plan: 1. ESRD - MWF HD. Needs a way to get to HD in [place] or transfer to a new unit or find a place to live in [place]. CM is working on finding a new unit. No word yet.  2. Chest pain- resolved 3. Hyperkalemia- resolved (sec to missed HD) 4. Hypertension/volume - BP 142/72 , monitor  with HD  possible drop in hosp. On Norvasc 10mg  hs and  slightly below dry wt 5. Anemia - hgb 12's- holding aranesp 25 and watch CBC, cont Fe q week.  6. Metabolic bone disease - Ca+ 8.8 phos 5.6 on  PO calcitriol on hd. Had not been taking sensipar as op but last PTH 788, -continue sensipar  90 mg q day and renvela 7. Nutrition - renavit. Last alb 3.9 8. HIV- per primary, not currently on meds at home. Is getting HIV meds here 9. Right Hip Pain -Prior in [facility] showed R Hip Osteonecrosis/ Plan  Per admit /  she was to FU with OP Orthoped. She did not 10. Dispo- [place] and cannot get transportation [place] from [place] for dialysis reliably. See above.  Lenny Pastelavid Zeyfang, PA-C [facility] Beeper [phone] 08/16/2014,7:55 AM  LOS: 6 days   Pt seen, examined and agree w A/P as above.  Vinson Moselleob Wilbern Pennypacker MD pager [phone]    cell [phone] 08/16/2014, 12:52 PM    Labs: Basic Metabolic Panel:  Recent Labs Lab 08/12/14 0743 08/14/14 0757 08/14/14 0804  NA 136* 136* 137  K 4.5 5.1 5.2  CL 94* 92* 92*  CO2 26 23 22   GLUCOSE 102* 94 91  BUN 19 43* 44*  CREATININE 5.10* 8.18* 8.24*  CALCIUM 8.7 8.6 8.8  PHOS  --   --  5.6*   Liver Function Tests:  Recent Labs Lab 08/14/14 0804  ALBUMIN 3.9   CBC:  Recent Labs Lab 08/10/14 1422 08/11/14 0230 08/12/14 0500 08/14/14 0757  WBC 10.7* 13.8* 8.3 9.5  HGB 11.1* 11.0* 12.1 12.3  HCT 35.0* 34.0* 36.7 37.5  MCV 108.4* 105.3* 104.9* 105.0*  PLT 224 233 240 247   Cardiac Enzymes:  Recent Labs Lab 08/10/14 1843 08/11/14 0100  TROPONINI <0.30 <0.30   CBG:  Recent Labs Lab 08/10/14 1923 08/10/14 1947 08/10/14 2149 08/14/14 1500 08/15/14 2241  GLUCAP 47* 104* 200* 207* 148*    Medications:   . amLODipine  10 mg Oral QHS  . calcitRIOL  2 mcg Oral Q M,W,F-HD  .  cinacalcet  90 mg Oral Q breakfast  . colchicine  0.3 mg Oral Once per day on Mon Thu  . ferric gluconate (FERRLECIT/NULECIT) IV  62.5 mg Intravenous Q Wed-HD  . FLUoxetine  20 mg Oral Daily  . heparin  5,000 Units Subcutaneous 3 times per day  . isosorbide mononitrate  30 mg Oral Daily  . lamiVUDine  25 mg Oral Daily  . lidocaine  1 patch Transdermal Q24H  . multivitamin  1 tablet Oral QHS  . pantoprazole  40 mg Oral Daily  . raltegravir  400 mg Oral BID  . sevelamer carbonate  2,400 mg Oral TID WC  . sodium chloride  3 mL Intravenous Q12H  . sodium chloride  3 mL Intravenous Q12H  . tenofovir  300 mg Oral Q Fri

## 2014-08-17 DIAGNOSIS — D539 Nutritional anemia, unspecified: Secondary | ICD-10-CM

## 2014-08-17 DIAGNOSIS — I5042 Chronic combined systolic (congestive) and diastolic (congestive) heart failure: Secondary | ICD-10-CM

## 2014-08-17 DIAGNOSIS — I10 Essential (primary) hypertension: Secondary | ICD-10-CM

## 2014-08-17 LAB — BASIC METABOLIC PANEL
Anion gap: 8 (ref 5–15)
BUN: 71 mg/dL — AB (ref 6–23)
CO2: 23 meq/L (ref 19–32)
Calcium: 7.8 mg/dL — ABNORMAL LOW (ref 8.4–10.5)
Chloride: 94 mEq/L — ABNORMAL LOW (ref 96–112)
Creatinine, Ser: 12.3 mg/dL — ABNORMAL HIGH (ref 0.50–1.10)
GFR calc Af Amer: 3 mL/min — ABNORMAL LOW (ref >90)
GFR, EST NON AFRICAN AMERICAN: 3 mL/min — AB (ref >90)
GLUCOSE: 102 mg/dL — AB (ref 70–99)
POTASSIUM: 5.5 meq/L — AB (ref 3.7–5.3)
Sodium: 125 mEq/L — ABNORMAL LOW (ref 137–147)

## 2014-08-17 LAB — HLA B*5701: HLA B 5701: NEGATIVE

## 2014-08-17 LAB — CBC
HEMATOCRIT: 31.9 % — AB (ref 36.0–46.0)
HEMOGLOBIN: 10.6 g/dL — AB (ref 12.0–15.0)
MCH: 34.1 pg — ABNORMAL HIGH (ref 26.0–34.0)
MCHC: 33.2 g/dL (ref 30.0–36.0)
MCV: 102.6 fL — ABNORMAL HIGH (ref 78.0–100.0)
Platelets: 246 10*3/uL (ref 150–400)
RBC: 3.11 MIL/uL — ABNORMAL LOW (ref 3.87–5.11)
RDW: 14.8 % (ref 11.5–15.5)
WBC: 8.9 10*3/uL (ref 4.0–10.5)

## 2014-08-17 MED ORDER — OXYCODONE-ACETAMINOPHEN 5-325 MG PO TABS
ORAL_TABLET | ORAL | Status: AC
  Administered 2014-08-17: 1
  Filled 2014-08-17: qty 1

## 2014-08-17 MED ORDER — NICOTINE 7 MG/24HR TD PT24
7.0000 mg | MEDICATED_PATCH | Freq: Every day | TRANSDERMAL | Status: DC
  Administered 2014-08-18 – 2014-08-22 (×2): 7 mg via TRANSDERMAL
  Filled 2014-08-17 (×9): qty 1

## 2014-08-17 MED ORDER — HEPARIN SODIUM (PORCINE) 1000 UNIT/ML IJ SOLN
5000.0000 [IU] | Freq: Once | INTRAMUSCULAR | Status: AC
  Administered 2014-08-17: 5000 [IU] via INTRAVENOUS

## 2014-08-17 NOTE — Clinical Social Work Note (Addendum)
Patient has changed discharge disposition from living with niece to going to SNF.  Patient stated that she doesn't want to live with her niece because her niece has a drug user staying with her and the patient is concerned that she will relapse.  [facility] retracted bed offer due problems with the patients past admission there and [facility]  is unwilling to accept patient for the same reason.  CSW contacted [facility] to see if any female beds are now available- no female beds available at this time.  CSW also contacted [facility] who had made a tentative offer on the patient- left message.  No other bed offers.  CSW will continue to follow.  Merlyn LotJenna Holoman, LCSWA Clinical Social Worker [phone]

## 2014-08-17 NOTE — Progress Notes (Signed)
Patient refusing to have PIV restarted.  Bedside RN aware. Dezmen Alcock, Lajean ManesKerry Loraine, RN VAST

## 2014-08-17 NOTE — Care Management Note (Signed)
CARE MANAGEMENT NOTE 08/17/2014  Patient:  [name],[name]   Account Number:  [id number]  Date Initiated:  08/11/2014  Documentation initiated by:  Chason Mciver  Subjective/Objective Assessment:   CM following for progession and d/c planning.     Action/Plan:   Per pt Renal MD, this pt needs to be placed in [place] [place] [facility] [place], per pt wishes. Call placed to that facility and await response.  08/17/14 CSW attempting to find SNF.   Anticipated DC Date:     Anticipated DC Plan:  SKILLED NURSING FACILITY         Choice offered to / List presented to:             Status of service:  In process, will continue to follow Medicare Important Message given?  YES (If response is "NO", the following Medicare IM given date fields will be blank) Date Medicare IM given:  08/17/2014 Medicare IM given by:  [name] Date Additional Medicare IM given:   Additional Medicare IM given by:    Discharge Disposition:    Per UR Regulation:    If discussed at Long Length of Stay Meetings, dates discussed:    Comments:  08/14/2014 Ongoing efforts to place this pt at [facility]. After multiple calls and messages left this CM was finally able to speak with [name] of Central Intake for the [facility]. She agreed to fax the list of information needed for [name] to be reviewed for possible admission. When the list had not arrived by midday, this CM contacted Ms [name] again and she was able to email the information. With the assistance of the HD center secretary , [name], change nurse [name] and the collection of data the infor was assembled and labs ordered that are required by this center. This CM also contacted pts current HD center on [name], [place] and requested the pt plan of care, and other info. [name] , of that center asked for the list to be faxed and has agreed to send every thing that the center has that may be helpful. Currently awaiting  info from the HD center ( spoke with [name] again today)  and labs drawn today and will be able to fax all info hopefully at the beginning of the week. In the mean time, the CSW is working with this pt on short term placement her in [place], so that she may continue to get HD as an outpatient here in [place] until the [facility] is able to give us an answer. Will fax all info to Central Intake for consideration when info is received from [facility] on [address]. Jean Shockheryl Zaleigh Bermingham RN MPH, case manager, [phone]  08/11/2014 Call placed to nearest HD center in [place] , central intake , [name] @ [phone] and message left re pt and HD needs. Will fax info as soon as call is returned. Fax # [phone].  Will continue to follow and provide info to Central Intake for [facility] at [facility] central intake.    CRoyal RN MPH, case manager, [phone]

## 2014-08-17 NOTE — Progress Notes (Signed)
Family Medicine Teaching Service Daily Progress Note Intern Pager: [phone]  Patient name: [name] Medical record number: [id number] Date of birth: [birth year] Age: 63 y.o. Gender: female  Primary Care Provider: No PCP Per Patient Consultants: Nephrology Code Status: Partial - DNI  Pt Overview and Major Events to Date:  11/2 - Admit to FPTS for hyperkalemia and emergent HD 11/4 - HD 11/6 - HD  Assessment and Plan:   [name] is a 63 y.o. year old female presenting with Hyperkalemia after missing dialysis and chest pain. Her past medical history significant for HIV, ESRD, HTN, tobacco and cocaineabuse, obesity,right hip osteonecrosis  Hyperkalemia (resolved) in setting of ESRD and missed HD: Main issue is social - Continue renal vitamins, Aranesp, sevelamer, calcitriol - Has major dispo issues as not able to locate new HD center for patient [facility]([place] refusing her, but she lives there. Possibility of [name]). Pt previously had said she could live with niece in [place], but now stating she cannot do this. Thus no safe dispo option at this time as [place].  Chest pain:  Improved. Onset after moving furniture.  Likely musculoskeletal. Troponin neg X 2.  EKG unchanged from previous - Continue K pad, lidoderm patch  Right hip pain:  Known right hip osteonecrosis, she is to follow-up with orthopedic surgery outpatient for this. - Prn pain medications ordered per home regimen, Percocet for moderate pain and Dilaudid PO for severe pain -Hip XR reviewed on 07/18/14- stable, will attempt to assist outpt f/up   Leukocytosis: Resolved. Unclear etiology, WBC 10.7>13.8>8.3  HIV infection: Patient has been without antiretrovirals for ~2-3 weeks.  Followed by ID in [place]. - ID signed off - outpt f/u in [facility] in 3-4 wks for repeat VL and CD4 - Continue antiretrovirals - CD4 1360, viral load undetectable - had PPD  placed on 11/5 at 1753 - needs to be read  Hypertension: BP currently well controlled. - Continue to monitor - continue home amlodipine, hydralazine, Imdur  Anemia, macrocytic: hemoglobin stable. Likely anemia of chronic renal disease, however with macrocytosis would consider folate and B-12 deficiency - Continue to monitor - Continue home Aranesp  Acute on chronic CHF combined systolic and diastolic 2/2 HTN/ESRD/intermittent substance abuse: BNP 16099 on admission - No signs of volume overload on exam, no dyspnea - management of fluid status with HD  FEN/GI: renal diet, KVO Prophylaxis: subcutaneous heparin  Disposition: Pt refused SNF, now agreeable again. Attempting to work with SW regarding dispo - no valid dispo options to ensure ability to get HD  Subjective: feeling well this AM. agreeable to SNF  Objective: Temp:  [97.4 F (36.3 C)-98.8 F (37.1 C)] 97.6 F (36.4 C) (11/09 0425) Pulse Rate:  [78-90] 78 (11/09 0425) Resp:  [17-18] 18 (11/09 0425) BP: (134-140)/(63-75) 134/63 mmHg (11/09 0425) SpO2:  [97 %-98 %] 97 % (11/09 0425) Weight:  [238 lb 15.7 oz (108.401 kg)] 238 lb 15.7 oz (108.401 kg) (11/08 2036) Physical Exam: Gen: NAD, alert, cooperative with exam, lying in bed HEENT: NCAT CV: RRR, good S1/S2, no murmur Resp: CTABL, no wheezes, non-labored Abd: obese, NT Ext: No edema, warm Neuro: Alert and oriented, EOMI, normal speech Psych: Normal mood and affect  Laboratory:  Recent Labs Lab 08/11/14 0230 08/12/14 0500 08/14/14 0757  WBC 13.8* 8.3 9.5  HGB 11.0* 12.1 12.3  HCT 34.0* 36.7 37.5  PLT 233 240 247    Recent Labs Lab  08/12/14 0743 08/14/14 0757 08/14/14 0804  NA 136* 136* 137  K 4.5 5.1 5.2  CL 94* 92* 92*  CO2 26 23 22   BUN 19 43* 44*  CREATININE 5.10* 8.18* 8.24*  CALCIUM 8.7 8.6 8.8  GLUCOSE 102* 94 91    BNP (last 3 results)  Recent Labs  06/04/14 2008 07/12/14 1249 08/10/14 1422  PROBNP 4928.0* 9434.0* 16099.0*     Troponins neg x2  Imaging/Diagnostic Tests: CXR (11/2): Stable chest.  EKG 08/10/2014: Normal sinus rhythm, slightly more prominent T waves but not overtly peaked   Shirlee LatchAngela Yarethzi Branan, MD 08/17/2014, 9:52 AM PGY-1, [facility] Family Medicine FPTS Intern pager: [phone], text pages welcome

## 2014-08-17 NOTE — Progress Notes (Signed)
IV team came for IV placement but patient refused.

## 2014-08-17 NOTE — Progress Notes (Deleted)
IV team present in patient's room. Patient refused IV access. Will continue to monitor. [name], Sanya Kobrin J

## 2014-08-17 NOTE — Evaluation (Signed)
Physical Therapy Evaluation Patient Details Name: [name] MRN: [id number] DOB: Aug 23, 1951 Today's Date: 08/17/2014   History of Present Illness  [name] is a 63 y.o. female presenting with chest and hip pain and hyperkalemia. PMH is significant for ESRD, HIV infection, morbid obesity, CHF, Depression/Anxiety, GERD, Anemia, Arthritis.  Clinical Impression  Pt admitted with the above. Pt currently with functional limitations due to the deficits listed below (see PT Problem List). At the time of PT eval pt was limited overall by R hip pain. Pt required close guard for safety as well as RW for support during transfer chair>bed. Pt will benefit from skilled PT to increase their independence and safety with mobility to allow discharge to the venue listed below.      Follow Up Recommendations SNF;Supervision/Assistance - 24 hour    Equipment Recommendations  3in1 (PT)    Recommendations for Other Services       Precautions / Restrictions Precautions Precautions: Fall Restrictions Weight Bearing Restrictions: No      Mobility  Bed Mobility Overal bed mobility: Needs Assistance Bed Mobility: Sit to Supine       Sit to supine: Min guard   General bed mobility comments: Pt was able to perform transfer to supine with min guard for RLE elevation onto bed. Pt able to scoot to [facility] without assistance.   Transfers Overall transfer level: Needs assistance Equipment used: Rolling walker (2 wheeled) Transfers: Sit to/from Stand Sit to Stand: Min assist         General transfer comment: VC's for hand placement on seated surface for safety. Pt required increased time to gain balance and achieve full standing position.   Ambulation/Gait Ambulation/Gait assistance: Min guard Ambulation Distance (Feet): 5 Feet Assistive device: Rolling walker (2 wheeled) Gait Pattern/deviations: Step-to pattern;Decreased stride length;Trendelenburg;Trunk flexed Gait velocity:  Decreased Gait velocity interpretation: Below normal speed for age/gender General Gait Details: Pt only willing to ambulate chair to bed. Close guard for safety, and pt reports increased pain with RLE weight bearing.   Stairs            Wheelchair Mobility    Modified Rankin (Stroke Patients Only)       Balance Overall balance assessment: Needs assistance Sitting-balance support: Feet supported;No upper extremity supported Sitting balance-Leahy Scale: Fair     Standing balance support: Bilateral upper extremity supported;During functional activity Standing balance-Leahy Scale: Poor Standing balance comment: Pt requires UE assist to maintain standing balance.                              Pertinent Vitals/Pain Pain Assessment: 0-10 Pain Score: 9  Pain Location: R hip with hip flexion. At rest 0/10.  Pain Intervention(s): Monitored during session;Premedicated before session    Home Living Family/patient expects to be discharged to:: Skilled nursing facility Living Arrangements: Children               Additional Comments: Pt with a complicated living history. She has a daughter and niece that she could go home with, however per pt family there use illegal drugs and pt does not want to be around that lifestyle as she is now clean. Concerned with falling back into using drugs if she stays there.     Prior Function Level of Independence: Needs assistance   Gait / Transfers Assistance Needed: Used wheelchair mostly for mobility due to R hip pain. Has a walker that she used for short  distances in the house.   ADL's / Homemaking Assistance Needed: Sponge bath only - could not stand up in the shower. When daughter was available she was assisting pt.         Hand Dominance   Dominant Hand: Right    Extremity/Trunk Assessment   Upper Extremity Assessment: Defer to OT evaluation           Lower Extremity Assessment: RLE deficits/detail RLE Deficits  / Details: Decr AROM and strength, limited by OA pain    Cervical / Trunk Assessment: Normal  Communication   Communication: No difficulties  Cognition Arousal/Alertness: Awake/alert Behavior During Therapy: WFL for tasks assessed/performed Overall Cognitive Status: Within Functional Limits for tasks assessed                      General Comments      Exercises        Assessment/Plan    PT Assessment Patient needs continued PT services  PT Diagnosis Difficulty walking;Acute pain   PT Problem List Decreased strength;Decreased range of motion;Decreased activity tolerance;Decreased balance;Decreased mobility;Decreased knowledge of use of DME;Decreased knowledge of precautions;Decreased safety awareness;Pain  PT Treatment Interventions Gait training;DME instruction;Stair training;Functional mobility training;Therapeutic activities;Therapeutic exercise;Neuromuscular re-education;Patient/family education   PT Goals (Current goals can be found in the Care Plan section) Acute Rehab PT Goals Patient Stated Goal: get R hip pain taken care of PT Goal Formulation: With patient Time For Goal Achievement: 08/31/14 Potential to Achieve Goals: Good    Frequency Min 2X/week   Barriers to discharge Decreased caregiver support Pt reports it is not in her best interest to go home with family members due to drug use in the home. States she will not have the support she needs there.     Co-evaluation               End of Session Equipment Utilized During Treatment: Gait belt Activity Tolerance: Patient limited by pain Patient left: in bed;with call bell/phone within reach Nurse Communication: Mobility status         Time: [id number] PT Time Calculation (min): 22 min   Charges:   PT Evaluation $Initial PT Evaluation Tier I: 1 Procedure PT Treatments $Gait Training: 8-22 mins   PT G Codes:          Conni SlipperKirkman, Klaire Court 08/17/2014, 12:18 PM   Conni SlipperLaura Adamari Frede, PT,  DPT Acute Rehabilitation Services Pager: [phone]

## 2014-08-17 NOTE — Progress Notes (Signed)
Subjective:  No cos/ for HD today  Objective Vital signs in last 24 hours: Filed Vitals:   08/16/14 0930 08/16/14 1732 08/16/14 2036 08/17/14 0425  BP: 137/78 140/69 138/75 134/63  Pulse: 88 90 90 78  Temp: 99 F (37.2 C) 98.8 F (37.1 C) 97.4 F (36.3 C) 97.6 F (36.4 C)  TempSrc: Oral Oral Oral Oral  Resp: 17 17 17 18   Height:      Weight:   108.401 kg (238 lb 15.7 oz)   SpO2: 98% 98% 98% 97%   Physical Exam  General: NAD , OX3  Heart: RRR no rub,mur, gallop  Lungs: CTA bilat  Abdomen: +BS,soft, obese. nontender  Extremities: no pedal edema  Dialysis Access: L AVF pos. bruit   Dialysis Orders: MWF @ [facility]  4 hr 109kgs 2K/2.25ca 9800 Heparin L AVF. 400/800  Calcitriol 2.25 mcg Aranesp 25q qweek Venofe 50 q week   Problem/Plan:  1. ESRD - MWF HD. Needs a way to get to HD in [place] or transfer to a new unit or find a place to live in [place]. CM is working on finding a new unit. No word yet.  2. Chest pain- resolved 3. Hyperkalemia-  (sec to missed HD)resolved with normal HD 4. Hypertension/volume - BP 134/63 ,  On Norvasc 10mg  hs and slightly below HD/monitor with HD possible drop in hosp 5. Anemia - hgb 12's- holding aranesp 25 and watch CBC, cont Fe q week.  6. Metabolic bone disease - Ca+ 8.8 phos 5.6 /on PO calcitriol on hd /renvela binder/.last PTH 788, -continue sensipar 90 mg q day a 7. Nutrition - renavit. Last alb 3.9 8. HIV- per primary, not currently on meds at home. Is getting HIV meds here 9. Right Hip Pain -[facility] showed [facility] Osteone[facility]rosis/ Plan Per admit / she was to FU with OP Orthoped. She was not able to go sec to living condition /transportation 10. Dispo- [place] and [place] get [place] from [place] for dialysis reliably.Needing SW involved   Lenny Pastelavid Zeyfang, PA-C [facility] [phone] 08/17/2014,8:08 AM  LOS: 7 days  I have seen and examined this patient and agree with plan per Lenny Pastelavid  Zeyfang.  Discussed with pt and SW that she is agreeable to NHP.  For HD today . [name],MD 08/17/2014 11:00 AM Labs: Basic Metabolic Panel:  Recent Labs Lab 08/12/14 0743 08/14/14 0757 08/14/14 0804  NA 136* 136* 137  K 4.5 5.1 5.2  CL 94* 92* 92*  CO2 26 23 22   GLUCOSE 102* 94 91  BUN 19 43* 44*  CREATININE 5.10* 8.18* 8.24*  CALCIUM 8.7 8.6 8.8  PHOS  --   --  5.6*   Liver Function Tests:  Recent Labs Lab 08/14/14 0804  ALBUMIN 3.9   CBC:  Recent Labs Lab 08/10/14 1422 08/11/14 0230 08/12/14 0500 08/14/14 0757  WBC 10.7* 13.8* 8.3 9.5  HGB 11.1* 11.0* 12.1 12.3  HCT 35.0* 34.0* 36.7 37.5  MCV 108.4* 105.3* 104.9* 105.0*  PLT 224 233 240 247   Cardiac Enzymes:  Recent Labs Lab 08/10/14 1843 08/11/14 0100  TROPONINI <0.30 <0.30   CBG:  Recent Labs Lab 08/10/14 1923 08/10/14 1947 08/10/14 2149 08/14/14 1500 08/15/14 2241  GLUCAP 47* 104* 200* 207* 148*  Medications:   . amLODipine  10 mg Oral QHS  . calcitRIOL  2 mcg Oral Q M,W,F-HD  . cinacalcet  90 mg Oral Q breakfast  .  colchicine  0.3 mg Oral Once per day on Mon Thu  . ferric gluconate (FERRLECIT/NULECIT) IV  62.5 mg Intravenous Q Wed-HD  . FLUoxetine  20 mg Oral Daily  . heparin  5,000 Units Subcutaneous 3 times per day  . isosorbide mononitrate  30 mg Oral Daily  . lamiVUDine  25 mg Oral Daily  . lidocaine  1 patch Transdermal Q24H  . multivitamin  1 tablet Oral QHS  . pantoprazole  40 mg Oral Daily  . raltegravir  400 mg Oral BID  . sevelamer carbonate  2,400 mg Oral TID WC  . sodium chloride  3 mL Intravenous Q12H  . sodium chloride  3 mL Intravenous Q12H  . tenofovir  300 mg Oral Q Fri

## 2014-08-18 DIAGNOSIS — N898 Other specified noninflammatory disorders of vagina: Secondary | ICD-10-CM

## 2014-08-18 LAB — WET PREP, GENITAL
TRICH WET PREP: NONE SEEN
WBC, Wet Prep HPF POC: NONE SEEN
Yeast Wet Prep HPF POC: NONE SEEN

## 2014-08-18 MED ORDER — ONDANSETRON HCL 4 MG PO TABS
4.0000 mg | ORAL_TABLET | Freq: Three times a day (TID) | ORAL | Status: DC | PRN
  Administered 2014-08-20: 4 mg via ORAL
  Filled 2014-08-18: qty 1

## 2014-08-18 NOTE — Progress Notes (Signed)
Subjective:  No cos', tolerated HD yest.  Objective Vital signs in last 24 hours: Filed Vitals:   08/17/14 1830 08/17/14 1845 08/17/14 1952 08/18/14 0454  BP: 119/82 148/85 116/80 142/70  Pulse: 102 98 101 88  Temp:  98.2 F (36.8 C) 98.9 F (37.2 C)   TempSrc:  Oral    Resp:  18 17 18   Height:      Weight:  110.5 kg (243 lb 9.7 oz)    SpO2:  98% 98% 97%   Weight change: 5.499 kg (12 lb 2 oz) Physical Exam  General: Alert, NAD , OX3  Heart: RRR no rub,mur, gallop  Lungs: CTA bilat  Abdomen: +BS,soft, obese. nontender  Extremities: no pedal edema  Dialysis Access: L AVF pos. bruit   Dialysis Orders: MWF @ [facility]  4 hr 109kgs 2K/2.25ca 9800 Heparin L AVF. 400/800  Calcitriol 2.25 mcg Aranesp 25q qweek Venofe 50 q week   Problem/Plan:  1. ESRD - MWF HD.Continue current HD schedule and  CM is working on finding a new unit/NH/Assisted Living. No word yet.  2. Chest pain- resolved 3. Hyperkalemia- (sec to missed HD)resolved with normal HD 4. Hypertension/volume - BP 142/70 , On Norvasc 10mg  hs and slightly below HD/monitor with HD possible drop in hosp 5. Anemia - hgb stable- holding aranesp 25 and watch CBC, cont Fe q week.  6. Metabolic bone disease -on PO calcitriol on hd /renvela binder/.last PTH 788, -continue sensipar 90 mg q day a 7. Nutrition - renavit. Last alb 3.9 8. HIV- per primary, not currently on meds at home. Is getting HIV meds here 9. Right [facility] Pain -[facility]one[facility]/ Plan Per admit / she is FU with OP Orthoped. She was not able to gopreviously sec to living condition /transportation 10. Dispo- [place] and [place] for dialysis reliably.CM/ SW involved  Lenny Pastelavid Zeyfang, PA-C [facility] [phone] 08/18/2014,8:06 AM  LOS: 8 days  I have seen and examined this patient and agree with plan [facility].  SW working on SNF.  Plan HD  tomorrow. Anagha Loseke T,MD 08/18/2014 9:32 AM Labs: Basic Metabolic Panel:  Recent Labs Lab 08/14/14 0757 08/14/14 0804 08/17/14 1445  NA 136* 137 125*  K 5.1 5.2 5.5*  CL 92* 92* 94*  CO2 23 22 23   GLUCOSE 94 91 102*  BUN 43* 44* 71*  CREATININE 8.18* 8.24* 12.30*  CALCIUM 8.6 8.8 7.8*  PHOS  --  5.6*  --    Liver Function Tests:  Recent Labs Lab 08/14/14 0804  ALBUMIN 3.9   No results for input(s): LIPASE, AMYLASE in the last 168 hours. No results for input(s): AMMONIA in the last 168 hours. CBC:  Recent Labs Lab 08/12/14 0500 08/14/14 0757 08/17/14 1445  WBC 8.3 9.5 8.9  HGB 12.1 12.3 10.6*  HCT 36.7 37.5 31.9*  MCV 104.9* 105.0* 102.6*  PLT 240 247 246   Cardiac Enzymes: No results for input(s): CKTOTAL, CKMB, CKMBINDEX, TROPONINI in the last 168 hours. CBG:  Recent Labs Lab 08/14/14 1500 08/15/14 2241  GLUCAP 207* 148*    Studies/Results: No results found. Medications:   . amLODipine  10 mg Oral QHS  . calcitRIOL  2 mcg Oral Q M,W,F-HD  . cinacalcet  90 mg Oral Q breakfast  . colchicine  0.3 mg Oral Once per day on Mon Thu  . ferric gluconate (FERRLECIT/NULECIT) IV  62.5 mg  Intravenous Q Wed-HD  . FLUoxetine  20 mg Oral Daily  . heparin  5,000 Units Subcutaneous 3 times per day  . isosorbide mononitrate  30 mg Oral Daily  . lamiVUDine  25 mg Oral Daily  . lidocaine  1 patch Transdermal Q24H  . multivitamin  1 tablet Oral QHS  . nicotine  7 mg Transdermal Daily  . pantoprazole  40 mg Oral Daily  . raltegravir  400 mg Oral BID  . sevelamer carbonate  2,400 mg Oral TID WC  . sodium chloride  3 mL Intravenous Q12H  . sodium chloride  3 mL Intravenous Q12H  . tenofovir  300 mg Oral Q Fri

## 2014-08-18 NOTE — Progress Notes (Signed)
Family Medicine Teaching Service Daily Progress Note Intern Pager: [phone]  Patient name: Jean [name] [name] Medical record number: [id number] Date of birth: 06/27/1951 Age: 63 y.o. Gender: female  Primary Care Provider: No PCP Per Patient Consultants: Nephrology Code Status: Partial - DNI  Pt Overview and Major Events to Date:  11/2 - Admit to FPTS for hyperkalemia and emergent HD 11/4 - HD 11/6 - HD  Assessment and Plan:   Jean [name] is a 63 y.o. year old female presenting with Hyperkalemia after missing dialysis and chest pain. Her past medical history significant for HIV, ESRD, HTN, tobacco and cocaineabuse, obesity,right hip osteonecrosis  Hyperkalemia (resolved) in setting of ESRD and missed HD: Main issue is social - Continue renal vitamins, Aranesp, sevelamer, calcitriol - Has major dispo issues as not able to locate new HD center for patient [facility]([place] refusing her, but she lives there. Possibility of [name]). Pt previously had said she could live with niece in [place], but now stating she cannot do this. Thus no safe dispo option at this time as [place].  Chest pain:  Improved. Onset after moving furniture.  Likely musculoskeletal. Troponin neg X 2.  EKG unchanged from previous - Continue K pad, lidoderm patch  Right hip pain:  Known right hip osteonecrosis, she is to follow-up with orthopedic surgery outpatient for this. - Prn pain medications ordered per home regimen, Percocet for moderate pain and Dilaudid PO for severe pain -Hip XR reviewed on 07/18/14- stable, will attempt to assist outpt f/up   Leukocytosis: Resolved. Unclear etiology, WBC 10.7>13.8>8.3  HIV infection: Patient has been without antiretrovirals for ~2-3 weeks.  Followed by ID in [place]. - ID signed off - outpt f/u in [facility] in 3-4 wks for repeat VL and CD4 - Continue antiretrovirals - CD4 1360, viral load undetectable - had PPD  placed on 11/5 at 1753 - needs to be read  Hypertension: BP elevated O/N but improving. - Continue to monitor - continue home amlodipine, hydralazine, Imdur  Anemia, macrocytic: hemoglobin stable. 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 Attempting to work with SW regarding dispo - no valid dispo options to ensure ability to get HD  Subjective: feeling well this AM. agreeable to SNF and awaiting information about bed offers  Objective: Temp:  [98 F (36.7 C)-98.9 F (37.2 C)] 98.9 F (37.2 C) (11/09 1952) Pulse Rate:  [86-102] 88 (11/10 0454) Resp:  [17-18] 18 (11/10 0454) BP: (116-185)/(56-85) 142/70 mmHg (11/10 0454) SpO2:  [97 %-98 %] 97 % (11/10 0454) Weight:  [243 lb 9.7 oz (110.5 kg)-251 lb 1.7 oz (113.9 kg)] 243 lb 9.7 oz (110.5 kg) (11/09 1845) Physical Exam: Gen: NAD, alert, cooperative with exam, lying in bed HEENT: NCAT CV: RRR, good S1/S2, no murmur Resp: CTABL, no wheezes, non-labored Abd: obese, NT Ext: No edema, warm Neuro: Alert and oriented, EOMI, normal speech Psych: Normal mood and affect  Laboratory:  Recent Labs Lab 08/12/14 0500 08/14/14 0757 08/17/14 1445  WBC 8.3 9.5 8.9  HGB 12.1 12.3  10.6*  HCT 36.7 37.5 31.9*  PLT 240 247 246    Recent Labs Lab 08/14/14 0757 08/14/14 0804 08/17/14 1445  NA 136* 137 125*  K 5.1 5.2 5.5*  CL 92* 92* 94*  CO2 23 22 23   BUN 43* 44* 71*  CREATININE 8.18* 8.24* 12.30*  CALCIUM 8.6 8.8 7.8*  GLUCOSE 94 91 102*    BNP (last 3 results)  Recent  Labs  06/04/14 2008 07/12/14 1249 08/10/14 1422  PROBNP 4928.0* 9434.0* 16099.0*    Troponins neg x2  Imaging/Diagnostic Tests: CXR (11/2): Stable chest.  EKG 08/10/2014: Normal sinus rhythm, slightly more prominent T waves but not overtly peaked   Shirlee LatchAngela Aeriana Speece, MD 08/18/2014, 9:29 AM PGY-1, [facility] Family Medicine FPTS Intern pager: [phone], text pages welcome

## 2014-08-18 NOTE — Plan of Care (Signed)
Problem: Discharge Progression Outcomes Goal: Pain controlled with appropriate interventions Outcome: Completed/Met Date Met:  08/18/14 PRN medications provide relief for the patient.

## 2014-08-18 NOTE — Plan of Care (Signed)
Problem: Phase I Progression Outcomes Goal: Other Phase I Outcomes/Goals Outcome: Not Applicable Date Met:  08/18/14     

## 2014-08-19 LAB — CBC
HEMATOCRIT: 32.9 % — AB (ref 36.0–46.0)
Hemoglobin: 11 g/dL — ABNORMAL LOW (ref 12.0–15.0)
MCH: 34.9 pg — AB (ref 26.0–34.0)
MCHC: 33.4 g/dL (ref 30.0–36.0)
MCV: 104.4 fL — AB (ref 78.0–100.0)
Platelets: 252 10*3/uL (ref 150–400)
RBC: 3.15 MIL/uL — ABNORMAL LOW (ref 3.87–5.11)
RDW: 14.8 % (ref 11.5–15.5)
WBC: 7.5 10*3/uL (ref 4.0–10.5)

## 2014-08-19 LAB — HEPATITIS B SURFACE ANTIGEN: HEP B S AG: NEGATIVE

## 2014-08-19 LAB — BASIC METABOLIC PANEL
ANION GAP: 20 — AB (ref 5–15)
BUN: 39 mg/dL — ABNORMAL HIGH (ref 6–23)
CO2: 23 meq/L (ref 19–32)
Calcium: 8.5 mg/dL (ref 8.4–10.5)
Chloride: 94 mEq/L — ABNORMAL LOW (ref 96–112)
Creatinine, Ser: 9.99 mg/dL — ABNORMAL HIGH (ref 0.50–1.10)
GFR calc Af Amer: 4 mL/min — ABNORMAL LOW (ref >90)
GFR, EST NON AFRICAN AMERICAN: 4 mL/min — AB (ref >90)
Glucose, Bld: 114 mg/dL — ABNORMAL HIGH (ref 70–99)
POTASSIUM: 5.7 meq/L — AB (ref 3.7–5.3)
SODIUM: 137 meq/L (ref 137–147)

## 2014-08-19 MED ORDER — OXYCODONE-ACETAMINOPHEN 5-325 MG PO TABS
ORAL_TABLET | ORAL | Status: AC
  Filled 2014-08-19: qty 1

## 2014-08-19 NOTE — Progress Notes (Signed)
PT Cancellation Note  Patient Details Name: [name] MRN: [id number] DOB: Jan 23, 1951   Cancelled Treatment:    Reason Eval/Treat Not Completed: Patient declined, no reason specified. Pt states she is currently having stomach cramps and her medication made her nauseated. Pt reports RN is aware. Will continue to follow.    Conni SlipperKirkman, Bobby Barton 08/19/2014, 2:35 PM   Conni SlipperLaura Alder Murri, PT, DPT Acute Rehabilitation Services Pager: [phone]

## 2014-08-19 NOTE — Progress Notes (Signed)
Family Medicine Teaching Service Daily Progress Note Intern Pager: [phone]  Patient name: [name] Medical record number: [id number] Date of birth: 09-15-1951 Age: 63 y.o. Gender: female  Primary Care Provider: No PCP Per Patient Consultants: Nephrology Code Status: Partial - DNI  Pt Overview and Major Events to Date:  11/2 - Admit to FPTS for hyperkalemia and emergent HD 11/4 - HD 11/6 - HD  Assessment and Plan:   Jean CooleyMargaret Ann Luiz is a 63 y.o. year old female presenting with Hyperkalemia after missing dialysis and chest pain. Her past medical history significant for HIV, ESRD, HTN, tobacco and cocaineabuse, obesity,right hip osteonecrosis  Hyperkalemia (resolved) in setting of ESRD and missed HD: Main issue is social - Continue renal vitamins, Aranesp, sevelamer, calcitriol - Has major dispo issues as not able to locate new HD center for patient [facility]([place] refusing her, but she lives there. Possibility of [name]). Pt previously had said she could live with niece in [place], but now stating she cannot do this. Thus no safe dispo option at this time as [place].  Vaginal discharge: Improved. Patient with report of brownish malodorous vaginal discharge x~3 days yesterday. - Wet prep with few clue cells, no yeast, no trich, no WBCs - No indication for treatment currently  Chest pain:  Improved. Onset after moving furniture.  Likely musculoskeletal. Troponin neg X 2.  EKG unchanged from previous - Continue K pad, lidoderm patch  Right hip pain:  Known right hip osteonecrosis, she is to follow-up with orthopedic surgery outpatient for this. - Prn pain medications ordered per home regimen, Percocet for moderate pain and Dilaudid PO for severe pain -Hip XR reviewed on 07/18/14- stable, will attempt to assist outpt f/up   Leukocytosis: Resolved. Unclear etiology, WBC 10.7>13.8>8.3  HIV infection: Patient has been  without antiretrovirals for ~2-3 weeks.  Followed by ID in [place]. - ID signed off - outpt f/u in [facility] in 3-4 wks for repeat VL and CD4 - Continue antiretrovirals - CD4 1360, viral load undetectable - had PPD placed on 11/5 at 1753 - needs to be read  Hypertension: BP elevated O/N but improving. - Continue to monitor - continue home amlodipine, hydralazine, Imdur  Anemia, macrocytic: hemoglobin stable. 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 Attempting to work with SW regarding dispo - no valid dispo options to ensure ability to get HD  Subjective: feeling well this AM. agreeable to SNF and awaiting information about bed offers vaginal discharge improved without any treatment  Objective: Temp:  [97.8 F (36.6 C)-98.4 F (36.9 C)] 98.2 F (36.8 C) (11/11 0714) Pulse Rate:  [81-93] 92 (11/11 0900) Resp:  [18-19] 18 (11/11 0714) BP: (119-161)/(60-86) 131/81 mmHg (11/11 0900) SpO2:  [94 %-98 %] 98 % (11/11 0714) Weight:  [242 lb 8.1 oz (110 kg)-246 lb 11.1 oz (111.9 kg)] 246 lb 11.1 oz (111.9 kg) (11/11 14780714) Physical Exam: Gen: NAD, alert, cooperative with exam, lying in bed HEENT: NCAT CV: RRR, good S1/S2, no murmur Resp: CTABL, no wheezes, non-labored  Abd: obese, NT Ext: No edema, warm Neuro: Alert and oriented, EOMI, normal speech Psych: Normal mood and affect  Laboratory:  Recent Labs Lab 08/14/14 0757 08/17/14 1445 08/19/14 0500  WBC 9.5 8.9 7.5  HGB 12.3 10.6* 11.0*  HCT 37.5 31.9* 32.9*  PLT 247 246 252     Recent Labs Lab 08/14/14 0804 08/17/14 1445 08/19/14 0500  NA 137 125* 137  K 5.2 5.5* 5.7*  CL 92* 94* 94*  CO2 22 23 23   BUN 44* 71* 39*  CREATININE 8.24* 12.30* 9.99*  CALCIUM 8.8 7.8* 8.5  GLUCOSE 91 102* 114*    BNP (last 3 results)  Recent Labs  06/04/14 2008 07/12/14 1249 08/10/14 1422  PROBNP 4928.0* 9434.0* 16099.0*    Troponins neg x2  Imaging/Diagnostic Tests: CXR (11/2): Stable chest.  EKG 08/10/2014: Normal sinus rhythm, slightly more prominent T waves but not overtly peaked   Shirlee LatchAngela Bacigalupo, MD 08/19/2014, 9:32 AM PGY-1, [facility] Family Medicine FPTS Intern pager: [phone], text pages welcome

## 2014-08-19 NOTE — Procedures (Signed)
Pt seen on HD.  BFR 350.  AP 190  Vp 190. Unable to get to BFR 400 due to high AP.  She says she can live with daughter [place] but if so [place] in [place].  Sending her to a NH in [place] without a long term plan does not solve the BIG problem.

## 2014-08-19 NOTE — Clinical Social Work Note (Signed)
CSW visited with patient to discuss discharge plans. [name] was visibly upset and is adamantly refusing going to a nursing home. Patient wants dialysis set-up in [facility] [facility] and she is aware that she cannot return [place] dialysis center [place]. [name] was upset that dialysis had not been set-up in [place] [facility] and CSW offered support and explained that efforts were underway to get dialysis approved [place] [facility] [place]. CSW will continue to follow through discharge and assist as needed.  Genelle BalVanessa Dreshawn Hendershott, MSW, LCSW [phone]

## 2014-08-20 NOTE — Progress Notes (Signed)
Family Medicine Teaching Service Daily Progress Note Intern Pager: [phone]  Patient name: [name] Medical record number: [id number] Date of birth: [birth year] Age: [age] y.o. Gender: female  Primary Care Provider: No PCP Per Patient Consultants: Nephrology Code Status: Partial - DNI  Pt Overview and Major Events to Date:  11/2 - Admit to FPTS for hyperkalemia and emergent HD 11/4 - HD 11/6 - HD  Assessment and Plan:   Jean CooleyMargaret Ann Harbach is a 63 y.o. year old female presenting with Hyperkalemia after missing dialysis and chest pain. Her past medical history significant for HIV, ESRD, HTN, tobacco and cocaineabuse, obesity,right hip osteonecrosis  Hyperkalemia (resolved) in setting of ESRD and missed HD: Main issue is social - Continue renal vitamins, Aranesp, sevelamer, calcitriol - Has major dispo issues as not able to locate new HD center for patient [facility]([place] refusing her, but she lives there. Possibility of [name]). Pt previously had said she could live with niece in [place], but now stating she cannot do this. Thus no safe dispo option at this time as [place].  Vaginal discharge: Resolved. Patient with report of brownish malodorous vaginal discharge x~3 days yesterday. - Wet prep with few clue cells, no yeast, no trich, no WBCs - No indication for treatment currently  Tachycardia: Tachycardic to 112, HR consistently low 100s this AM. No respiratory compromise. - Monitor closely, consider further work-up if persists    Chest pain:  Improved. Onset after moving furniture.  Likely musculoskeletal. Troponin neg X 2.  EKG unchanged from previous - Continue K pad, lidoderm patch  Right hip pain:  Known right hip osteonecrosis, she is to follow-up with orthopedic surgery outpatient for this. - Prn pain medications ordered per home regimen, Percocet for moderate pain and Dilaudid PO for severe pain -Hip XR reviewed  on 07/18/14- stable, will attempt to assist outpt f/up   Leukocytosis: Resolved. Unclear etiology, WBC 10.7>13.8>8.3  HIV infection: Patient has been without antiretrovirals for ~2-3 weeks.  Followed by ID in [place]. - ID signed off - outpt f/u in [facility] in 3-4 wks for repeat VL and CD4 - Continue antiretrovirals - CD4 1360, viral load undetectable - had PPD placed on 11/5 at 1753 - needs to be read  Hypertension: BP elevated O/N but improving. - Continue to monitor - continue home amlodipine, hydralazine, Imdur  Anemia, macrocytic: hemoglobin stable. 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 Attempting to work with SW regarding dispo - no valid dispo options to ensure ability to get HD  Subjective: feeling well this AM. Thinks [place] [facility] may have a spot for her. Is willing to go to SNF if it will take a few weeks to get her set up in new HD center.  Objective: Temp:  [98.2 F (36.8 C)-98.7 F (37.1 C)] 98.5 F (36.9 C) (11/12 0943) Pulse Rate:  [95-112] 105 (11/12 0943) Resp:  [18] 18 (11/12 0943) BP: (116-130)/(67-83) 128/67 mmHg (11/12 0943) SpO2:  [91 %-97 %] 95 % (11/12 0943) Weight:  [240 lb 1.3  oz (108.9 kg)] 240 lb 1.3 oz (108.9 kg) (11/11 1130) Physical Exam: Gen: NAD, alert, cooperative with exam, lying in bed HEENT: NCAT CV: RRR, good S1/S2, no murmur Resp: CTABL, no wheezes, non-labored Abd: obese, NT Ext: No edema, warm, no calf tenderness Neuro: Alert and oriented, EOMI, normal speech Psych: Normal mood  and affect  Laboratory:  Recent Labs Lab 08/14/14 0757 08/17/14 1445 08/19/14 0500  WBC 9.5 8.9 7.5  HGB 12.3 10.6* 11.0*  HCT 37.5 31.9* 32.9*  PLT 247 246 252    Recent Labs Lab 08/14/14 0804 08/17/14 1445 08/19/14 0500  NA 137 125* 137  K 5.2 5.5* 5.7*  CL 92* 94* 94*  CO2 22 23 23   BUN 44* 71* 39*  CREATININE 8.24* 12.30* 9.99*  CALCIUM 8.8 7.8* 8.5  GLUCOSE 91 102* 114*    BNP (last 3 results)  Recent Labs  06/04/14 2008 07/12/14 1249 08/10/14 1422  PROBNP 4928.0* 9434.0* 16099.0*    Troponins neg x2  Imaging/Diagnostic Tests: CXR (11/2): Stable chest.  EKG 08/10/2014: Normal sinus rhythm, slightly more prominent T waves but not overtly peaked   Shirlee LatchAngela Bacigalupo, MD 08/20/2014, 9:53 AM PGY-1, [facility] FPTS Intern pager: [phone], text pages welcome

## 2014-08-20 NOTE — Progress Notes (Signed)
Subjective:  No cos , Tolerated HD yesterday/"Im ready to go"  Objective Vital signs in last 24 hours: Filed Vitals:   08/19/14 1130 08/19/14 1219 08/19/14 1723 08/20/14 0500  BP: 116/76 129/78 122/77 129/74  Pulse: 97 104 112 102  Temp: 98.2 F (36.8 C) 98.3 F (36.8 C) 98.7 F (37.1 C) 98.3 F (36.8 C)  TempSrc: Oral Oral Oral Oral  Resp: 18 18 18 18   Height:      Weight: 108.9 kg (240 lb 1.3 oz)     SpO2: 97% 91% 95% 95%   Weight change: 1.9 kg (4 lb 3 oz) Physical Exam  General: Alert, NAD , OX3  Heart: RRR no rub,mur, gallop  Lungs: CTA bilat  Abdomen: +BS,soft, obese. nontender  Extremities: no pedal edema  Dialysis Access: L AVF pos. bruit   Dialysis Orders: MWF @ [facility]  4 hr 109kgs 2K/2.25ca 9800 Heparin L AVF. 400/800  Calcitriol 2.25 mcg Aranesp 25q qweek Venofe 50 q week   Problem/Plan:  1. ESRD - MWF HD.Continue current HD schedule and CM is working on finding a new unit/NH/Assisted Living. No word yet.  2. Chest pain- resolved 3. Hyperkalemia- (sec to missed HD)resolved with normal HD 4. Hypertension/volume - BP 129/74, On Norvasc 10mg  hs and at edw /monitor with HD possible drop in hosp 5. Anemia - hgb 11/  holding aranesp 25 and watch CBC, cont Fe q week.  6. Metabolic bone disease -on PO calcitriol on hd /renvela binder/.last PTH 788, -continue sensipar 90 mg q day a 7. Nutrition - renavit. Last alb 3.9 8. HIV- per primary, not currently on meds at home. Is getting HIV meds here 9. Right Hip Pain -[facility] showed R Hip Osteonecrosis/ Plan Per admit / she is FU with OP Orthoped. She was not able to gopreviously sec to living condition /transportation 10. Dispo- [place]  WITHOUT HD ARRANGED/ not accepted by [facility] HD then or now so far and cannot get [place] from there for dialysis reliably.CM/ SW involved  Lenny Pastelavid Zeyfang, PA-C [facility]  [phone] 08/20/2014,8:07 AM  LOS: 10 days  I have seen and examined this patient and agree with plan per Lenny Pastelavid Zeyfang.  Pt has once again changed her mind.  SW [place].  Plan HD in AM  Aris Even T,MD 08/20/2014 9:31 AM Labs: Basic Metabolic Panel:  Recent Labs Lab 08/14/14 0804 08/17/14 1445 08/19/14 0500  NA 137 125* 137  K 5.2 5.5* 5.7*  CL 92* 94* 94*  CO2 22 23 23   GLUCOSE 91 102* 114*  BUN 44* 71* 39*  CREATININE 8.24* 12.30* 9.99*  CALCIUM 8.8 7.8* 8.5  PHOS 5.6*  --   --    Liver Function Tests:  Recent Labs Lab 08/14/14 0804  ALBUMIN 3.9   CBC:  Recent Labs Lab 08/14/14 0757 08/17/14 1445 08/19/14 0500  WBC 9.5 8.9 7.5  HGB 12.3 10.6* 11.0*  HCT 37.5 31.9* 32.9*  MCV 105.0* 102.6* 104.4*  PLT 247 246 252   Cardiac Enzymes: No results for input(s): CKTOTAL, CKMB, CKMBINDEX, TROPONINI in the last 168 hours. CBG:  Recent Labs Lab 08/14/14 1500 08/15/14 2241  GLUCAP 207* 148*    Studies/Results: No results found. Medications:   . amLODipine  10 mg Oral QHS  . calcitRIOL  2 mcg Oral Q M,W,F-HD  . cinacalcet  90 mg Oral Q breakfast  .  colchicine  0.3 mg Oral Once per day on Mon Thu  . ferric gluconate (FERRLECIT/NULECIT) IV  62.5 mg Intravenous Q Wed-HD  . FLUoxetine  20 mg Oral Daily  . heparin  5,000 Units Subcutaneous 3 times per day  . isosorbide mononitrate  30 mg Oral Daily  . lamiVUDine  25 mg Oral Daily  . lidocaine  1 patch Transdermal Q24H  . multivitamin  1 tablet Oral QHS  . nicotine  7 mg Transdermal Daily  . pantoprazole  40 mg Oral Daily  . raltegravir  400 mg Oral BID  . sevelamer carbonate  2,400 mg Oral TID WC  . sodium chloride  3 mL Intravenous Q12H  . sodium chloride  3 mL Intravenous Q12H  . tenofovir  300 mg Oral Q Fri

## 2014-08-20 NOTE — Progress Notes (Signed)
PT Cancellation Note  Patient Details Name: Jean CooleyMargaret Ann [name] MRN: [id number] DOB: 1951-06-24   Cancelled Treatment:    Reason Eval/Treat Not Completed: Patient declined, no reason specified.   Conni SlipperKirkman, Jamisen Hawes 08/20/2014, 12:52 PM   Conni SlipperLaura Aariona Momon, PT, DPT Acute Rehabilitation Services Pager: [phone]

## 2014-08-21 LAB — RENAL FUNCTION PANEL
ALBUMIN: 3.6 g/dL (ref 3.5–5.2)
Anion gap: 20 — ABNORMAL HIGH (ref 5–15)
BUN: 48 mg/dL — ABNORMAL HIGH (ref 6–23)
CALCIUM: 7.8 mg/dL — AB (ref 8.4–10.5)
CO2: 24 meq/L (ref 19–32)
Chloride: 89 mEq/L — ABNORMAL LOW (ref 96–112)
Creatinine, Ser: 10.71 mg/dL — ABNORMAL HIGH (ref 0.50–1.10)
GFR, EST AFRICAN AMERICAN: 4 mL/min — AB (ref >90)
GFR, EST NON AFRICAN AMERICAN: 3 mL/min — AB (ref >90)
Glucose, Bld: 170 mg/dL — ABNORMAL HIGH (ref 70–99)
PHOSPHORUS: 4.6 mg/dL (ref 2.3–4.6)
Potassium: 5.3 mEq/L (ref 3.7–5.3)
SODIUM: 133 meq/L — AB (ref 137–147)

## 2014-08-21 MED ORDER — POLYETHYLENE GLYCOL 3350 17 G PO PACK
17.0000 g | PACK | Freq: Every day | ORAL | Status: DC
  Administered 2014-08-22 – 2014-08-23 (×2): 17 g via ORAL
  Filled 2014-08-21 (×6): qty 1

## 2014-08-21 MED ORDER — SENNA 8.6 MG PO TABS
1.0000 | ORAL_TABLET | Freq: Every day | ORAL | Status: DC | PRN
  Filled 2014-08-21: qty 1

## 2014-08-21 NOTE — Progress Notes (Signed)
Patient complains of lower abdominal pain and constipation. MD notified. Will continue to monitor patient.

## 2014-08-21 NOTE — Clinical Social Work Note (Signed)
CSW attempting to locate a skilled facility for patient. Bed search in [place] has yielded no bed offers. Calls made and messages left for admissions staff at [facility]. Patient has informed CSW and nurse case manager that she will not go to a skilled facility, however case manager unable to find a dialysis center that will accept patient - six centers have refused patient.  In conversations with patient on 11/11 and 11/12, [name] is adamantly refusing SNF placement. Her plan is to return to her daughter's home in [place]. CSW will continue to follow and will f/u [facility] on Monday.  Genelle BalVanessa Hiawatha Merriott, MSW, LCSW [phone]

## 2014-08-21 NOTE — Progress Notes (Signed)
PT Cancellation Note  Patient Details Name: Jean CooleyMargaret Ann Mittal MRN: [id number] DOB: 1951/01/13   Cancelled Treatment:    Reason Eval/Treat Not Completed: Patient at procedure or test/unavailable, at HD.   Fabio AsaWerner, Yamil Dougher J 08/21/2014, 2:17 PM Charlotte Crumbevon Hajer Dwyer, PT DPT  [phone]

## 2014-08-21 NOTE — Procedures (Signed)
Pt seen on HD.  Ap 180 Vp 170.  BFR 400.  Pt says she understands that she has to go to a SNF and stay in [place] and says she is OK with this.

## 2014-08-21 NOTE — Progress Notes (Signed)
Family Medicine Teaching Service Daily Progress Note Intern Pager: [phone]  Patient name: Jean CooleyMargaret Ann Swider Medical record number: [id number] Date of birth: 11-22-1950 Age: 63 y.o. Gender: female  Primary Care Provider: No PCP Per Patient Consultants: Nephrology Code Status: Partial - DNI  Pt Overview and Major Events to Date:  11/2 - Admit to FPTS for hyperkalemia and emergent HD 11/4 - HD 11/6 - HD  Assessment and Plan:   [name] is a 63 y.o. year old female presenting with Hyperkalemia after missing dialysis and chest pain. Her past medical history significant for HIV, ESRD, HTN, tobacco and cocaineabuse, obesity,right hip osteonecrosis  Hyperkalemia (resolved) in setting of ESRD and missed HD: Main issue is social - Continue renal vitamins, Aranesp, sevelamer, calcitriol - Has major dispo issues as not able to locate new HD center for patient [facility] [place] [facility], but she lives there. Possibility of [name]). Pt previously had said she could live with niece in [place], but now stating she cannot do this. Thus no safe dispo option at this time as [place].  Vaginal discharge: Resolved. Patient with report of brownish malodorous vaginal discharge x~3 days yesterday. - Wet prep with few clue cells, no yeast, no trich, no WBCs - No indication for treatment currently  Tachycardia: Resolved. Could be related to volume depletion 2/2 HD. - Monitor closely, consider further work-up if persists    Chest pain:  Improved. Onset after moving furniture.  Likely musculoskeletal. Troponin neg X 2.  EKG unchanged from previous - Continue K pad, lidoderm patch  Right hip pain:  Known right hip osteonecrosis, she is to follow-up with orthopedic surgery outpatient for this. - Prn pain medications ordered per home regimen, Percocet for moderate pain and Dilaudid PO for severe pain -Hip XR reviewed on 07/18/14- stable, will  attempt to assist outpt f/up   Leukocytosis: Resolved. Unclear etiology, WBC 10.7>13.8>8.3  HIV infection: Patient has been without antiretrovirals for ~2-3 weeks.  Followed by ID in [place]. - ID signed off - outpt f/u in [facility] in 3-4 wks for repeat VL and CD4 - Continue antiretrovirals - CD4 1360, viral load undetectable - had PPD placed on 11/5 at 1753 - needs to be read  Hypertension: BP elevated O/N but improving. - Continue to monitor - continue home amlodipine, hydralazine, Imdur  Anemia, macrocytic: hemoglobin stable. 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 Attempting to work with SW regarding dispo - no valid dispo options to ensure ability to get HD  Subjective: feeling well this AM. Apologetic about yesterday  Objective: Temp:  [98 F (36.7 C)-98.9 F (37.2 C)] 98 F (36.7 C) (11/13 0450) Pulse Rate:  [90-105] 90 (11/13 0450) Resp:  [17-20] 17 (11/13 0450) BP: (114-147)/(67-87) 147/87 mmHg (11/13 0450) SpO2:  [93 %-98 %] 98 % (11/13 0450) Physical Exam: Gen: NAD, alert, cooperative with exam, lying in bed HEENT: NCAT CV: RRR, good S1/S2, no murmur Resp: CTABL, no wheezes, non-labored Abd: obese, NT Ext: No edema, warm, no calf tenderness Neuro: Alert  and oriented, EOMI, normal speech Psych: Normal mood and affect  Laboratory:  Recent Labs Lab 08/14/14 0757 08/17/14 1445 08/19/14 0500  WBC 9.5 8.9 7.5  HGB 12.3 10.6* 11.0*  HCT 37.5 31.9* 32.9*  PLT 247 246 252    Recent Labs Lab 08/14/14 0804  08/17/14 1445 08/19/14 0500  NA 137 125* 137  K 5.2 5.5* 5.7*  CL 92* 94* 94*  CO2 22 23 23   BUN 44* 71* 39*  CREATININE 8.24* 12.30* 9.99*  CALCIUM 8.8 7.8* 8.5  GLUCOSE 91 102* 114*    BNP (last 3 results)  Recent Labs  06/04/14 2008 07/12/14 1249 08/10/14 1422  PROBNP 4928.0* 9434.0* 16099.0*    Troponins neg x2  Imaging/Diagnostic Tests: CXR (11/2): Stable chest.  EKG 08/10/2014: Normal sinus rhythm, slightly more prominent T waves but not overtly peaked   Shirlee LatchAngela Bacigalupo, MD 08/21/2014, 7:52 AM PGY-1, [facility] Family Medicine FPTS Intern pager: [phone], text pages welcome

## 2014-08-21 NOTE — Progress Notes (Signed)
Subjective:  Apologetic for yesterday's behavior, no complaints, pleasant and cooperative  Objective: Vital signs in last 24 hours: Temp:  [98 F (36.7 C)-98.9 F (37.2 C)] 98 F (36.7 C) (11/13 0450) Pulse Rate:  [90-105] 90 (11/13 0450) Resp:  [17-20] 17 (11/13 0450) BP: (114-147)/(67-87) 147/87 mmHg (11/13 0450) SpO2:  [93 %-98 %] 98 % (11/13 0450) Weight change:   Intake/Output from previous day: 11/12 0701 - 11/13 0700 In: 1724 [P.O.:1724] Out: -  Intake/Output this shift:   EXAM: General appearance:  Alert, in no apparent distress Resp:  CTA without rales, rhonchi, or wheezes Cardio:  RRR without murmur or rub GI:  + BS, soft and nontender Extremities:  No edema Access:  AVF @ LFA with + bruit  Lab Results:  Recent Labs  08/19/14 0500  WBC 7.5  HGB 11.0*  HCT 32.9*  PLT 252   BMET:  Recent Labs  08/19/14 0500  NA 137  K 5.7*  CL 94*  CO2 23  GLUCOSE 114*  BUN 39*  CREATININE 9.99*  CALCIUM 8.5   No results for input(s): PTH in the last 72 hours. Iron Studies: No results for input(s): IRON, TIBC, TRANSFERRIN, FERRITIN in the last 72 hours.  Studies/Results: No results found.   Dialysis Orders: MWF @ [facility]  4 hr 109kgs 2K/2.25ca 9800 Heparin L AVF. 400/800  Calcitriol 2.25 mcg Aranesp 25q qweek Venofe 50 q week   Assessment/Plan: 1. Chest pain - resolved.  2. Hyperkalemia - sec to missed HD, resolved. 3. ESRD - HD on MWF @ [facility].  HD pending today. 4. HTN/Volume - BP 147/87 on Amlodipine 10 mg qd; @ EDW with HD pending. 5. Anemia - Hgb 11; Aranesp on hold, weekly Fe. 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 7. Nutrition - Alb 3.9, renal diet, vitamin. 8. HIV - on antivirals since admission. 9. R hip pain - sec to osteonecrosis, follow-up with ortho as outpatient. 10. Dispo - Assisted Living placement pending.   LOS: 11 days   [name],CHARLES 08/21/2014,7:38 AM I have seen and examined this  patient and agree with plan per Gerome Apleyharles [name].  Plan for HD today.  Still awaiting placement in a HD unit in [place] or [place]. Mckinzee Spirito T,MD 08/21/2014 9:08 AM

## 2014-08-21 NOTE — Care Management Note (Signed)
CARE MANAGEMENT NOTE 08/21/2014  Patient:  Jeralene HuffLSTON,Geana ANN   Account Number:  [id number]  Date Initiated:  08/11/2014  Documentation initiated by:  Kamyia Thomason  Subjective/Objective Assessment:   CM following for progession and d/c planning.     Action/Plan:   Per pt Renal MD, this pt needs to be placed in [facility] [facility] [place], per pt wishes. Call placed to that facility and await response.  08/17/14 CSW attempting to find SNF.   Anticipated DC Date:     Anticipated DC Plan:  SKILLED NURSING FACILITY         Choice offered to / List presented to:             Status of service:  In process, will continue to follow Medicare Important Message given?  YES (If response is "NO", the following Medicare IM given date fields will be blank) Date Medicare IM given:  08/17/2014 Medicare IM given by:  Deondria Puryear Date Additional Medicare IM given:   Additional Medicare IM given by:    Discharge Disposition:    Per UR Regulation:    If discussed at Long Length of Stay Meetings, dates discussed:    Comments:  08/21/2014 Onogoing efforts to place this pt in an outpatient HD center near her daughter's [place]. All info faxed to [facility] for placement. Six HD centers within a 20-40 mile radius of pt current home were explored and none of the facilities are able to provide services to this pt . Pt has been most upset with this CM as she mistakenly understood that her daughter had been able to find a center for her in [place] , however the pt daughter apparently misunderstood the information she recieved from that facility when she placed a call there. As there are no accepting facilities near the daughters home and the pt has a chair at [facility] in [place] her only option at this time is to remain in the [place] area and continue dialyisis in [place]. This CM also spoke with [facility] and they are unable to continue to transport this [place]  [place], they were very willing to transport to any of the centers in the [place]. This information has been given to the attending physicains. Johny Shockheryl Manjit Bufano RN MPH, case manager, [phone]     08/19/2014 All info obtained and packet of information faxed to [facility] [place] , for this pt to place in [place]. Multiple calls placed to [facility] service to confirm that they will transport this pt to either center if we are able to get a spot. I also spoke with [facility] in [place] to try to arrange HD at that location , however they confirmed that the pt may not return to that center. Of concern to this CM is that all of these centers are considered "sister" centers and word of this pt and her behaviors has spread to other centers. Hopefully this will not impact her abillity to be placed at [place] or [place]. Pt today has refused to be adm to a SNF. The plan was to adm to a local SNF so that she could continue to [facility] [address]. Given complexity of trying to place this pt for outpatient HD , I will discuss with hospital administration as  I am unable to justify to pt insurance her ongoing need for acute hospitalization  because she refused short term SNF placement while we continue to search for outpatient HD sites that will accept her near her home. Please call for any questions. CRoyal RN MPH, case manager, [phone]  08/14/2014 Ongoing efforts to place this pt at [facility]. After multiple calls and messages left this CM was finally able to speak with [name] of Central Intake for the [facility]. She agreed to fax the list of information needed for [name] to be reviewed for possible admission. When the list had not arrived by midday, this CM contacted Ms Para MarchDuncan again and she was able to email the information. With the assistance of the HD center secretary , [name],  change nurse Bella KennedyAllyson K and the collection of data the infor was assembled and labs ordered that are required by this center. This CM also contacted pts current HD center on [name], [place], [place] and requested the pt plan of care, and other info. [name] , of that center asked for the list to be faxed and has agreed to send every thing that the center has that may be helpful. Currently awaiting info from the HD center ( spoke with [name] again today)  and labs drawn today and will be able to fax all info hopefully at the beginning of the week. In the mean time, the CSW is working with this pt on short term placement her in [place], so that she may continue to get HD as an outpatient [place] in [place] until the [facility] is able to give us an answer. Will fax all info to Central Intake for consideration when info is received from [facility] [address]. Johny Shockheryl Asaf Elmquist RN MPH, case manager, [phone]  08/11/2014 Call placed to nearest HD center in [place], [place] , central intake , [name] @ [phone] and message left re pt and HD needs. Will fax info as soon as call is returned. Fax # [phone].  Will continue to follow and provide info to Central Intake for [facility] central intake.    CRoyal RN MPH, case manager, [phone]

## 2014-08-22 NOTE — Progress Notes (Signed)
Subjective:   No complaints, cooperative, awaiting SNF placement  Objective: Vital signs in last 24 hours: Temp:  [97.3 F (36.3 C)-99 F (37.2 C)] 98.1 F (36.7 C) (11/14 0542) Pulse Rate:  [99-116] 99 (11/14 0542) Resp:  [18] 18 (11/14 0542) BP: (101-155)/(60-89) 118/74 mmHg (11/14 0542) SpO2:  [94 %-99 %] 98 % (11/14 0542) Weight:  [108.2 kg (238 lb 8.6 oz)-111.3 kg (245 lb 6 oz)] 108.2 kg (238 lb 8.6 oz) (11/13 1804) Weight change:   Intake/Output from previous day: 11/13 0701 - 11/14 0700 In: 480 [P.O.:480] Out: 3000  Intake/Output this shift:    EXAM: General appearance: Alert, in no apparent distress Resp: CTA without rales, rhonchi, or wheezes Cardio: RRR without murmur or rub GI: + BS, soft and nontender Extremities: No edema Access: AVF @ LFA with + bruit  Lab Results: No results for input(s): WBC, HGB, HCT, PLT in the last 72 hours. BMET:  Recent Labs  08/21/14 1416  NA 133*  K 5.3  CL 89*  CO2 24  GLUCOSE 170*  BUN 48*  CREATININE 10.71*  CALCIUM 7.8*  ALBUMIN 3.6   No results for input(s): PTH in the last 72 hours. Iron Studies: No results for input(s): IRON, TIBC, TRANSFERRIN, FERRITIN in the last 72 hours.  Studies/Results: No results found.   Dialysis Orders: MWF @ [facility]  4 hr 109kgs 2K/2.25ca 9800 Heparin L AVF. 400/800  Calcitriol 2.25 mcg Aranesp 25q qweek Venofer 50 q week  Assessment/Plan: 1. Chest pain - resolved.  2. Hyperkalemia - sec to missed HD, resolved. 3. ESRD - HD on MWF @ [facility].  Next HD 11/16. 4. HTN/Volume - BP 118/74 on Amlodipine 10 mg qd; below EDW s/p UF goal 3 L yesterday. 5. Anemia - Hgb 11; Aranesp on hold, weekly Fe. 6. Sec HPT - Ca 7.8 (8.1 corrected), P 4.6; Calcitriol 2 mcg, Sensipar 90 mg qd, Renvela 3 with meals. 7. Nutrition - Alb 3.6, renal diet, vitamin. 8. HIV - on antivirals since admission. 9. R hip pain - sec to osteonecrosis, follow-up with ortho as outpatient. 10. Dispo - Assisted Living  placement pending.    LOS: 12 days   [name],CHARLES 08/22/2014,7:07 AM  I have seen and examined this patient and agree with plan per Gerome Apleyharles [name].  Pt told me yest that she would go to SN[place] though SW note says she is refusing?  Danahi Reddish T,MD 08/22/2014 8:32 AM

## 2014-08-22 NOTE — Progress Notes (Signed)
PT Cancellation Note  Patient Details Name: Jean CooleyMargaret Ann Klipfel MRN: [id number] DOB: [birth year]   Cancelled Treatment:    Reason Eval/Treat Not Completed: Patient declined, no reason specified.  Encouraged OOB with therapy, and when pt declined, offered bed exercise instead. Pt continues to refuse stating she "feels too sick and weak" to participate. Will check back tomorrow.   Conni SlipperKirkman, Tiombe Tomeo 08/22/2014, 1:54 PM   Conni SlipperLaura Christia Coaxum, PT, DPT Acute Rehabilitation Services Pager: [phone]

## 2014-08-22 NOTE — Progress Notes (Signed)
Family Medicine Teaching Service Daily Progress Note Intern Pager: [phone]  Patient name: Jean CooleyMargaret Ann Gladue Medical record number: [id number] Date of birth: [birth year] Age: 63 y.o. Gender: female  Primary Care Provider: No PCP Per Patient Consultants: Nephrology Code Status: Partial - DNI  Pt Overview and Major Events to Date:  11/2 - Admit to FPTS for hyperkalemia and emergent HD 11/4 - HD 11/6 - HD  Assessment and Plan:   [name] is a 63 y.o. year old female presenting with Hyperkalemia after missing dialysis and chest pain. Her past medical history significant for HIV, ESRD, HTN, tobacco and cocaineabuse, obesity,right hip osteonecrosis  Hyperkalemia (resolved) in setting of ESRD and missed HD: Main issue is social - Continue renal vitamins, Aranesp, sevelamer, calcitriol - Has major dispo issues as not able to locate new HD center for patient [facility]([place] refusing her, but she lives there. Possibility of [name]). Pt previously had said she could live with niece in [place], but now stating she cannot do this. Thus no safe dispo option at this time as she [place].  Constipation: Reporting constipation O/N, now resolved. - Continue Miralax daily and senna prn  Vaginal discharge: Resolved. Patient with report of brownish malodorous vaginal discharge.  Wet prep with few clue cells, no yeast, no trich, no WBCs - No indication for treatment currently  Tachycardia: Resolved. Could be related to volume depletion 2/2 HD.    Chest pain:  Improved. Onset after moving furniture.  Likely musculoskeletal. Troponin neg X 2.  EKG unchanged from previous - Continue K pad, lidoderm patch  Right hip pain:  Known right hip osteonecrosis, she is to follow-up with orthopedic surgery outpatient for this. - Prn pain medications ordered per home regimen, Percocet for moderate pain and Dilaudid PO for severe pain -Hip XR reviewed on  07/18/14- stable, will attempt to assist outpt f/up   Leukocytosis: Resolved. Unclear etiology, WBC 10.7>13.8>8.3  HIV infection: Patient has been without antiretrovirals for ~2-3 weeks.  Followed by ID in [place]. - ID signed off - outpt f/u in [facility] in 3-4 wks for repeat VL and CD4 - Continue antiretrovirals - CD4 1360, viral load undetectable - had PPD placed on 11/5 at 1753 - needs to be read  Hypertension: Stable, well controlled.  Continue home amlodipine, hydralazine, Imdur  Anemia, macrocytic: hemoglobin stable. 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 Attempting to work with SW regarding dispo - no valid dispo options to ensure ability to get HD  Subjective: feeling well this AM. Constipation improved with BM this AM.  Objective: Temp:  [97.3 F (36.3 C)-99 F (37.2 C)] 98.1 F (36.7 C) (11/14 0542) Pulse Rate:  [99-116] 99 (11/14 0542) Resp:  [18] 18 (11/14 0542) BP: (101-155)/(60-89) 118/74 mmHg (11/14 0542) SpO2:  [94 %-99 %] 98 % (11/14 0542) Weight:  [238 lb 8.6 oz (108.2 kg)-245 lb 6 oz (111.3 kg)] 238 lb 8.6 oz (108.2 kg) (11/13 1804) Physical Exam: Gen: NAD, alert, cooperative with exam, lying in bed HEENT: NCAT CV: RRR,  good S1/S2, no murmur Resp: CTABL, no wheezes, non-labored Abd: obese, NT Ext: No edema, warm, no calf tenderness Neuro: Alert and oriented, EOMI, normal speech Psych: Normal mood and affect  Laboratory:  Recent Labs Lab 08/17/14 1445 08/19/14 0500  WBC 8.9 7.5  HGB 10.6* 11.0*   HCT 31.9* 32.9*  PLT 246 252    Recent Labs Lab 08/17/14 1445 08/19/14 0500 08/21/14 1416  NA 125* 137 133*  K 5.5* 5.7* 5.3  CL 94* 94* 89*  CO2 23 23 24   BUN 71* 39* 48*  CREATININE 12.30* 9.99* 10.71*  CALCIUM 7.8* 8.5 7.8*  GLUCOSE 102* 114* 170*    BNP (last 3 results)  Recent Labs  06/04/14 2008 07/12/14 1249 08/10/14 1422  PROBNP 4928.0* 9434.0* 16099.0*    Troponins neg x2  Imaging/Diagnostic Tests: CXR (11/2): Stable chest.  EKG 08/10/2014: Normal sinus rhythm, slightly more prominent T waves but not overtly peaked   Shirlee LatchAngela Manvi Guilliams, MD 08/22/2014, 9:11 AM PGY-1, [facility] Medicine FPTS Intern pager: [phone], text pages welcome

## 2014-08-23 NOTE — Progress Notes (Signed)
Family Medicine Teaching Service Daily Progress Note Intern Pager: [phone]  Patient name: [name] Medical record number: [id number] Date of birth: 02/07/1951 Age: 63 y.o. Gender: female  Primary Care Provider: No PCP Per Patient Consultants: Nephrology Code Status: Partial - DNI  Pt Overview and Major Events to Date:  11/2 - Admit to FPTS for hyperkalemia and emergent HD 11/4 - HD (continued MWF) 11/15 - Had been agreeable to SNF, now declining, stating [place] [facility] [place].  Assessment and Plan:   Jean CooleyMargaret Ann Gaccione is a 63 y.o. year old female presenting with Hyperkalemia after missing dialysis and chest pain. Her past medical history significant for HIV, ESRD, HTN, tobacco and cocaine abuse, obesity, right hip osteonecrosis.  Hyperkalemia (resolved) in setting of ESRD and missed HD: Main issue is social - Continue renal vitamins, Aranesp, sevelamer, calcitriol - Has major dispo issues as not able to locate new HD center for patient [facility]([place] refusing her, but she lives there. Possibility of [name]). Pt previously had said she could live with niece in [place], but now stating she cannot do this. Thus no safe dispo option at this time as she [place] not have transportation [place] for [place]. Now stating she wants to live with daughter in [place].  Constipation: Reporting constipation O/N 11/13, now resolved. - Continue Miralax daily and senna prn  Vaginal discharge: Resolved. Patient with report of brownish malodorous vaginal discharge.  Wet prep with few clue cells, no yeast, no trich, no WBCs - No indication for treatment currently  Tachycardia: Resolved. Could be related to volume depletion 2/2 HD.    Chest pain:  Improved. Onset after moving furniture.  Likely musculoskeletal. Troponin neg X 2.  EKG unchanged from previous - Continue K pad, lidoderm patch  Right hip pain:  Known right hip osteonecrosis, she is to follow-up with  orthopedic surgery outpatient for this. - Prn pain medications ordered per home regimen, Percocet for moderate pain and Dilaudid PO for severe pain - Hip XR reviewed on 07/18/14- stable, will attempt to assist outpt f/up   Leukocytosis: Resolved. Unclear etiology, WBC 10.7>13.8>8.3  HIV infection: Patient has been without antiretrovirals for ~2-3 weeks.  Followed by ID in [place]. - ID signed off - outpt f/u in [facility] in 3-4 wks for repeat VL and CD4 - Continue antiretrovirals - CD4 1360, viral load undetectable - had PPD placed on 11/5 at 1753 - needs to be read - found yellow card in bedside drawer, appears it was never read. Will re-place.  Hypertension: Stable, well controlled.  Continue home amlodipine, hydralazine, Imdur  Anemia, macrocytic: hemoglobin stable. 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 Attempting to work with SW regarding dispo - no valid dispo options to ensure ability to get HD  Subjective: Still feeling well this AM. Denies chest pain, dyspnea, or other pain. Amenable to SNF at this point despite prior saying wanted to live with  dtr, because understands she needs HD and states there is not one available for [place] in [place].  Objective: Temp:  [97.9 F (36.6 C)] 97.9 F (36.6 C) (11/14 2230) Pulse Rate:  [98-99] 99 (11/14 2230) Resp:  [18-19] 18 (11/14 2230) BP: (100-141)/(73-92) 100/73 mmHg (11/14  2230) SpO2:  [97 %-98 %] 97 % (11/14 2230) Weight:  [242 lb 1 oz (109.8 kg)] 242 lb 1 oz (109.8 kg) (11/14 2230) Physical Exam: Gen: NAD, alert, cooperative with exam, lying in bed HEENT: NCAT CV: RRR, good S1/S2, no murmur, 1+ B DP pulses Resp: CTABL, no wheezes, non-labored Abd: obese, NT, no organomegaly Ext: No edema, warm, no calf tenderness Neuro: Alert and oriented, EOMI, normal speech Psych: Normal mood and affect  Laboratory:  Recent Labs Lab 08/17/14 1445 08/19/14 0500  WBC 8.9 7.5  HGB 10.6* 11.0*  HCT 31.9* 32.9*  PLT 246 252    Recent Labs Lab 08/17/14 1445 08/19/14 0500 08/21/14 1416  NA 125* 137 133*  K 5.5* 5.7* 5.3  CL 94* 94* 89*  CO2 23 23 24   BUN 71* 39* 48*  CREATININE 12.30* 9.99* 10.71*  CALCIUM 7.8* 8.5 7.8*  GLUCOSE 102* 114* 170*    BNP (last 3 results)  Recent Labs  06/04/14 2008 07/12/14 1249 08/10/14 1422  PROBNP 4928.0* 9434.0* 16099.0*    Troponins neg x2  Imaging/Diagnostic Tests: CXR (11/2): Stable chest.  EKG 08/10/2014: Normal sinus rhythm, slightly more prominent T waves but not overtly peaked   Leona SingletonMaria T Keller Bounds, MD 08/23/2014, 9:17 AM PGY-3, [facility] FPTS Intern pager: [phone], text pages welcome

## 2014-08-23 NOTE — Progress Notes (Signed)
Subjective:  Irritation at right eyelid, but no other complaints  Objective: Vital signs in last 24 hours: Temp:  [97.9 F (36.6 C)] 97.9 F (36.6 C) (11/14 2230) Pulse Rate:  [98-99] 99 (11/14 2230) Resp:  [18-19] 18 (11/14 2230) BP: (100-141)/(73-92) 100/73 mmHg (11/14 2230) SpO2:  [97 %-98 %] 97 % (11/14 2230) Weight:  [109.8 kg (242 lb 1 oz)] 109.8 kg (242 lb 1 oz) (11/14 2230) Weight change: -1.5 kg (-3 lb 4.9 oz)  Intake/Output from previous day: 11/14 0701 - 11/15 0700 In: 840 [P.O.:840] Out: -  Intake/Output this shift:   Lab Results: No results for input(s): WBC, HGB, HCT, PLT in the last 72 hours. BMET:  Recent Labs  08/21/14 1416  NA 133*  K 5.3  CL 89*  CO2 24  GLUCOSE 170*  BUN 48*  CREATININE 10.71*  CALCIUM 7.8*  ALBUMIN 3.6   No results for input(s): PTH in the last 72 hours. Iron Studies: No results for input(s): IRON, TIBC, TRANSFERRIN, FERRITIN in the last 72 hours.  Studies/Results: No results found.   EXAM: General appearance: Alert, in no apparent distress Resp: CTA without rales, rhonchi, or wheezes Cardio: RRR without murmur or rub GI: + BS, soft and nontender Extremities: No edema Access: AVF @ LFA with + bruit  Dialysis Orders: MWF @ [facility]  4 hr 109kgs 2K/2.25ca 9800 Heparin L AVF. 400/800  Calcitriol 2.25 mcg Aranesp 25q qweek Venofer 50 q week  Assessment/Plan: 1. Chest pain - resolved.  2. Hyperkalemia - sec to missed HD, resolved. 3. ESRD - HD on MWF @ [facility]. Next HD 11/16. 4. HTN/Volume - BP stable on Amlodipine 10 mg qd; @ EDW. 5. Anemia - Hgb 11; Aranesp on hold, weekly Fe. 6. Sec HPT - Ca 7.8 (8.1 corrected), P 4.6; Calcitriol 2 mcg, Sensipar 90 mg qd, Renvela 3 with meals. 7. Nutrition - Alb 3.6, renal diet, vitamin. 8. HIV - on antivirals only since admission. 9. R hip pain - sec to osteonecrosis, follow-up with ortho as outpatient. 10. Dispo - now refusing SNF placement, want to return to daughter's home [place].    LOS: 13 days   [name],CHARLES 08/23/2014,7:39 AM I have seen and examined this patient and agree with plan per Gerome Apleyharles [name].  Her mind changes daily on what she is agreeable to.  This makes it difficult to get a plan.  Living in [place] and dialyzing in [place] is not a good option as this is what landed her in the hospital in the first place.  Plan HD in AM  Jumaane Weatherford T,MD 08/23/2014 8:55 AM

## 2014-08-24 LAB — RENAL FUNCTION PANEL
Albumin: 3.7 g/dL (ref 3.5–5.2)
Anion gap: 21 — ABNORMAL HIGH (ref 5–15)
BUN: 67 mg/dL — AB (ref 6–23)
CO2: 24 mEq/L (ref 19–32)
CREATININE: 12.35 mg/dL — AB (ref 0.50–1.10)
Calcium: 8.1 mg/dL — ABNORMAL LOW (ref 8.4–10.5)
Chloride: 89 mEq/L — ABNORMAL LOW (ref 96–112)
GFR calc Af Amer: 3 mL/min — ABNORMAL LOW (ref >90)
GFR calc non Af Amer: 3 mL/min — ABNORMAL LOW (ref >90)
Glucose, Bld: 110 mg/dL — ABNORMAL HIGH (ref 70–99)
PHOSPHORUS: 4.7 mg/dL — AB (ref 2.3–4.6)
POTASSIUM: 5.9 meq/L — AB (ref 3.7–5.3)
Sodium: 134 mEq/L — ABNORMAL LOW (ref 137–147)

## 2014-08-24 LAB — CBC
HEMATOCRIT: 33.7 % — AB (ref 36.0–46.0)
HEMOGLOBIN: 11 g/dL — AB (ref 12.0–15.0)
MCH: 34.6 pg — ABNORMAL HIGH (ref 26.0–34.0)
MCHC: 32.6 g/dL (ref 30.0–36.0)
MCV: 106 fL — AB (ref 78.0–100.0)
Platelets: 247 10*3/uL (ref 150–400)
RBC: 3.18 MIL/uL — ABNORMAL LOW (ref 3.87–5.11)
RDW: 14.4 % (ref 11.5–15.5)
WBC: 7.9 10*3/uL (ref 4.0–10.5)

## 2014-08-24 MED ORDER — HEPARIN SODIUM (PORCINE) 1000 UNIT/ML DIALYSIS
9800.0000 [IU] | INTRAMUSCULAR | Status: DC | PRN
  Filled 2014-08-24: qty 10

## 2014-08-24 MED ORDER — LIDOCAINE HCL (PF) 1 % IJ SOLN
5.0000 mL | INTRAMUSCULAR | Status: DC | PRN
Start: 2014-08-24 — End: 2014-08-24

## 2014-08-24 MED ORDER — ALTEPLASE 2 MG IJ SOLR
2.0000 mg | Freq: Once | INTRAMUSCULAR | Status: DC | PRN
  Filled 2014-08-24: qty 2

## 2014-08-24 MED ORDER — NEPRO/CARBSTEADY PO LIQD
237.0000 mL | ORAL | Status: DC | PRN
  Filled 2014-08-24: qty 237

## 2014-08-24 MED ORDER — LIDOCAINE-PRILOCAINE 2.5-2.5 % EX CREA
1.0000 "application " | TOPICAL_CREAM | CUTANEOUS | Status: DC | PRN
  Filled 2014-08-24: qty 5

## 2014-08-24 MED ORDER — SODIUM CHLORIDE 0.9 % IV SOLN: 100.0000 mL | INTRAVENOUS | Status: DC | PRN

## 2014-08-24 MED ORDER — PENTAFLUOROPROP-TETRAFLUOROETH EX AERO: 1.0000 "application " | INHALATION_SPRAY | CUTANEOUS | Status: DC | PRN

## 2014-08-24 MED ORDER — HEPARIN SODIUM (PORCINE) 1000 UNIT/ML DIALYSIS: 1000.0000 [IU] | INTRAMUSCULAR | Status: DC | PRN

## 2014-08-24 NOTE — Progress Notes (Signed)
PT Cancellation Note  Patient Details Name: Jean CooleyMargaret Ann [name] MRN: [id number] DOB: [birth year]   Cancelled Treatment:    Reason Eval/Treat Not Completed: Patient at procedure or test/unavailable. Checked on pt once prior to lunch and twice after lunch and pt not in room on all 3 occasions. Will continue to follow and check back as schedule allows.    Conni SlipperKirkman, [place] Wank 08/24/2014, 2:16 PM  Conni SlipperLaura Xan Ingraham, PT, DPT Acute Rehabilitation Services Pager: [phone]

## 2014-08-24 NOTE — Clinical Social Work Note (Addendum)
CSW talked with admissions staff person [facility] patient and they will not be able to accept patient. CSW contacted resident and advised her of no SNF bed for patient.   Genelle BalVanessa Thersea Manfredonia, MSW, LCSW [phone]

## 2014-08-24 NOTE — Progress Notes (Signed)
Patient discharged home per MD order.  Spoke with [name], SW concerning transportation home. Patient [name] and will be staying with her daughter.  Patient upset over discharge and stated she doesn't have a ride home.  I explained that an ambulance has been called to take her to her daughter's house.  Patient stated that she can not go to her daughters house that she has no where to go.  MD, CN, and Vanessa with SW notified.  Ambulance cancelled per [name].  Patient notified.  Patient complained of CP.  Vitals stable and MD notified.  EKG complete per MD order.  Ativan given for anxiety.

## 2014-08-24 NOTE — Progress Notes (Signed)
Family Medicine Teaching Service Daily Progress Note Intern Pager: [phone]  Patient name: [name] Medical record number: [id number] Date of birth: [birth year] Age: 63 y.o. Gender: female  Primary Care Provider: No PCP Per Patient Consultants: Nephrology Code Status: Partial - DNI  Pt Overview and Major Events to Date:  11/2 - Admit to FPTS for hyperkalemia and emergent HD 11/4 - HD (continued MWF) 11/15 - Had been agreeable to SNF, now declining, stating [place].  Assessment and Plan:   [name] is a 63 y.o. year old female presenting with Hyperkalemia after missing dialysis and chest pain. Her past medical history significant for HIV, ESRD, HTN, tobacco and cocaine abuse, obesity, right hip osteonecrosis.  Hyperkalemia (resolved) in setting of ESRD and missed HD: Main issue is social - Continue renal vitamins, Aranesp, sevelamer, calcitriol - Has major dispo issues as not able to locate new HD center for patient [facility]([place] refusing her, but she lives there. Possibility of [name]). Patient indecisive about living with family members and getting HD in [place]. Wants to live with daughter in [place], but no HD options there.  - Renal following, HD MWF  Constipation: Reporting constipation O/N 11/13, now resolved. - Continue Miralax daily and senna prn  Vaginal discharge: Resolved. Patient with report of brownish malodorous vaginal discharge.  Wet prep with few clue cells, no yeast, no trich, no WBCs - No indication for treatment currently  Tachycardia: Resolved. Could be related to volume depletion 2/2 HD.    Chest pain:  Improved. Onset after moving furniture.  Likely musculoskeletal. Troponin neg X 2.  EKG unchanged from previous - Continue K pad, lidoderm patch  Right hip pain:  Known right hip osteonecrosis, she is to follow-up with orthopedic surgery outpatient for this. - Prn pain medications ordered per home regimen,  Percocet for moderate pain and Dilaudid PO for severe pain - Hip XR reviewed on 07/18/14- stable, will attempt to assist outpt f/up   Leukocytosis: Resolved. Unclear etiology, WBC 10.7>13.8>8.3  HIV infection: Patient has been without antiretrovirals for ~2-3 weeks.  Followed by ID in [place]. - ID signed off - outpt f/u in [facility] in 3-4 wks for repeat VL and CD4 - Continue antiretrovirals - CD4 1360, viral load undetectable - had PPD placed on 11/5 at 1753 - needs to be read - found yellow card in bedside drawer, appears it was never read. Will re-place.  Hypertension: Stable, well controlled.  Continue home amlodipine, hydralazine, Imdur  Anemia, macrocytic: hemoglobin stable. 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 Now agreeable to SNF. She [place], she can't get HD there - no bed offers currently. Attempting to work with SW regarding dispo - no valid dispo options to ensure ability to get HD  Subjective: No complaints this morning. Would like to go outside. No chest pain or shortness of breath.   Objective: Temp:  [97.2 F (36.2 C)-98.8 F (37.1 C)] 98.2 F (36.8 C) (11/16 0600) Pulse Rate:  [89-102] 89 (11/16 0600) Resp:  [  19-20] 20 (11/16 0600) BP: (126-138)/(43-71) 127/71 mmHg (11/16 0600) SpO2:  [94 %-99 %] 95 % (11/16 0600) Weight:  [242 lb 3.5 oz (109.87 kg)] 242 lb 3.5 oz (109.87 kg) (11/15 2113) Physical Exam: Gen: NAD, alert, cooperative with exam, lying in bed HEENT: NCAT CV: RRR, good S1/S2, no  murmur, 1+ B DP pulses Resp: CTABL, no wheezes, non-labored Abd: obese, NT, no organomegaly Ext: No edema, warm, no calf tenderness Neuro: Alert and oriented, EOMI, normal speech Psych: Normal mood and affect  Laboratory:  Recent Labs Lab 08/17/14 1445 08/19/14 0500  WBC 8.9 7.5  HGB 10.6* 11.0*  HCT 31.9* 32.9*  PLT 246 252    Recent Labs Lab 08/17/14 1445 08/19/14 0500 08/21/14 1416  NA 125* 137 133*  K 5.5* 5.7* 5.3  CL 94* 94* 89*  CO2 23 23 24   BUN 71* 39* 48*  CREATININE 12.30* 9.99* 10.71*  CALCIUM 7.8* 8.5 7.8*  GLUCOSE 102* 114* 170*    BNP (last 3 results)  Recent Labs  06/04/14 2008 07/12/14 1249 08/10/14 1422  PROBNP 4928.0* 9434.0* 16099.0*    Troponins neg x2  Imaging/Diagnostic Tests: CXR (11/2): Stable chest.  EKG 08/10/2014: Normal sinus rhythm, slightly more prominent T waves but not overtly peaked   [name] Jean Woodlief, MD 08/24/2014, 8:43 AM PGY-1, [facility] Family Medicine FPTS Intern pager: [phone], text pages welcome

## 2014-08-24 NOTE — Progress Notes (Addendum)
Subjective:   No complaints. Agrees to SNF placement  Objective Filed Vitals:   08/23/14 0900 08/23/14 1850 08/23/14 2113 08/24/14 0600  BP: 138/43 136/55 126/69 127/71  Pulse: 102 99 91 89  Temp: 98.8 F (37.1 C) 97.2 F (36.2 C) 97.8 F (36.6 C) 98.2 F (36.8 C)  TempSrc: Oral Oral Oral Oral  Resp: 19 20 20 20   Height:   5\' 4"  (1.626 m)   Weight:   109.87 kg (242 lb 3.5 oz)   SpO2: 98% 99% 94% 95%   Physical Exam General: Alert and oriented, no acute distress.  Heart: RRR Lungs: CTA, unlabored Abdomen: soft, nontender +BS  Extremities: no edema Dialysis Access: L AVF +bruit  Dialysis Orders: MWF @ [facility]  4 hr 109kgs 2K/2.25ca 9800 Heparin L AVF. 400/800  Calcitriol 2.25 mcg Aranesp 25q qweek Venofer 50 q week  Assessment/Plan: 1. Chest pain / hyperkalemia - resolved 2. ESRD - HD on MWF @ [facility]. HD pending today 3. HTN/Volume - BP 127/71 on Amlodipine 10 mg qd; below edw, lower at DC 4. Anemia - Hgb 11; Aranesp on hold, weekly Fe. 5. Sec HPT - Ca 7.8 (8.1 corrected), P 4.6; Calcitriol 2 mcg, Sensipar 90 mg qd, Renvela 3 with meals. 6. Nutrition - Alb 3.6, renal diet, vitamin. 7. HIV - on antivirals since admission. 8. R hip pain - sec to osteonecrosis, follow-up with ortho as outpatient. 9. Dispo - Assisted Living placement pending.  Jetty DuhamelBridget Whelan, NP [facility] [phone] 08/24/2014,8:54 AM  LOS: 14 days   Pt seen, examined and agree w A/P as above. Resuming the search for a SNF, pt is now agreeable to this option again.  I spoke with patient's son to let him know that patient seems to be "burning her bridges" with regards to nursing facilities and dialysis units, because of her past behavior issues, and that our hands are tied to some extent and that we were doing the best we can considering the circumstances.  He acknowledged frustration as well and also that she has a lot of issues that she is dealing with including addiction, but that she  would have to make the choice to change. I agreed.  Vinson Moselleob Arthelia Callicott MD pager [phone]    cell [phone] 08/24/2014, 11:29 AM    Additional Objective Labs: Basic Metabolic Panel:  Recent Labs Lab 08/17/14 1445 08/19/14 0500 08/21/14 1416  NA 125* 137 133*  K 5.5* 5.7* 5.3  CL 94* 94* 89*  CO2 23 23 24   GLUCOSE 102* 114* 170*  BUN 71* 39* 48*  CREATININE 12.30* 9.99* 10.71*  CALCIUM 7.8* 8.5 7.8*  PHOS  --   --  4.6   Liver Function Tests:  Recent Labs Lab 08/21/14 1416  ALBUMIN 3.6   No results for input(s): LIPASE, AMYLASE in the last 168 hours. CBC:  Recent Labs Lab 08/17/14 1445 08/19/14 0500  WBC 8.9 7.5  HGB 10.6* 11.0*  HCT 31.9* 32.9*  MCV 102.6* 104.4*  PLT 246 252   Blood Culture No results found for: SDES, SPECREQUEST, CULT, REPTSTATUS  Cardiac Enzymes: No results for input(s): CKTOTAL, CKMB, CKMBINDEX, TROPONINI in the last 168 hours. CBG: No results for input(s): GLUCAP in the last 168 hours. Iron Studies: No results for input(s): IRON, TIBC, TRANSFERRIN, FERRITIN in the last 72 hours. @lablastinr3 @ Studies/Results: No results found. Medications:   . amLODipine  10 mg Oral QHS  . calcitRIOL  2 mcg Oral Q M,W,F-HD  . cinacalcet  90 mg  Oral Q breakfast  . colchicine  0.3 mg Oral Once per day on Mon Thu  . ferric gluconate (FERRLECIT/NULECIT) IV  62.5 mg Intravenous Q Wed-HD  . FLUoxetine  20 mg Oral Daily  . heparin  5,000 Units Subcutaneous 3 times per day  . isosorbide mononitrate  30 mg Oral Daily  . lamiVUDine  25 mg Oral Daily  . lidocaine  1 patch Transdermal Q24H  . multivitamin  1 tablet Oral QHS  . nicotine  7 mg Transdermal Daily  . pantoprazole  40 mg Oral Daily  . polyethylene glycol  17 g Oral Daily  . raltegravir  400 mg Oral BID  . sevelamer carbonate  2,400 mg Oral TID WC  . sodium chloride  3 mL Intravenous Q12H  . tenofovir  300 mg Oral Q Fri

## 2014-08-25 NOTE — Progress Notes (Signed)
Subjective:   Upset about pending discharge. Still does not have transportation to HD   Objective Filed Vitals:   08/24/14 1801 08/24/14 1815 08/24/14 2041 08/25/14 0433  BP: 125/65 140/20 96/57 113/59  Pulse:   110 95  Temp: 97.9 F (36.6 C) 97.7 F (36.5 C) 98.8 F (37.1 C) 98.4 F (36.9 C)  TempSrc: Oral Oral Oral Oral  Resp: 20 22 19 18   Height:      Weight:      SpO2: 98% 97% 98% 98%   Physical Exam General: alert and oriented. No acute distress.  Heart: RRR Lungs: CTA, unlabored Abdomen: soft, nontender +BS  Extremities: no edema Dialysis Access:  L AVF +bruit  Dialysis Orders: MWF @ [facility]  4 hr 109kgs 2K/2.25ca 9800 Heparin L AVF. 400/800  Calcitriol 2.25 mcg Aranesp 25q qweek Venofer 50 q week  Assessment/Plan: 1. Chest pain / hyperkalemia - resolved 2. ESRD - HD on MWF @ [facility]. HD tomorrow.  3. HTN/Volume - BP 113/59 on Amlodipine 10 mg qd; below edw, lower at DC 4. Anemia - Hgb 11; Aranesp on hold, weekly Fe. 5. Sec HPT - Ca 8.1 (8.3 corrected), P 4.7; Calcitriol 2 mcg, Sensipar 90 mg qd, Renvela 3 with meals. 6. Nutrition - Alb 3.7, renal diet, vitamin. 7. HIV - on antivirals since admission. 8. R hip pain - sec to osteonecrosis, follow-up with ortho as outpatient. 9. Dispo - Family unable to provide any assistance with transportation   Jetty DuhamelBridget Whelan, NP [facility] Beeper [phone] 08/25/2014,9:02 AM  LOS: 15 days   Pt seen, examined and agree w A/P as above.  Have d/w CM, patient was turned down for HD by [facility], a [facility] of [facility], for dialysis at any of it's units [place] [address] [place], [place], [place], [place], [place], [place] and possibly one more. She was turned down because of prior behavior issues. She has OP HD set up as previous [place].   Vinson Moselleob Hanzel Pizzo MD pager [phone]    cell [phone] 08/25/2014, 11:34 AM    Additional Objective Labs: Basic  Metabolic Panel:  Recent Labs Lab 08/19/14 0500 08/21/14 1416 08/24/14 0800  NA 137 133* 134*  K 5.7* 5.3 5.9*  CL 94* 89* 89*  CO2 23 24 24   GLUCOSE 114* 170* 110*  BUN 39* 48* 67*  CREATININE 9.99* 10.71* 12.35*  CALCIUM 8.5 7.8* 8.1*  PHOS  --  4.6 4.7*   Liver Function Tests:  Recent Labs Lab 08/21/14 1416 08/24/14 0800  ALBUMIN 3.6 3.7   No results for input(s): LIPASE, AMYLASE in the last 168 hours. CBC:  Recent Labs Lab 08/19/14 0500 08/24/14 0800  WBC 7.5 7.9  HGB 11.0* 11.0*  HCT 32.9* 33.7*  MCV 104.4* 106.0*  PLT 252 247   Blood Culture No results found for: SDES, SPECREQUEST, CULT, REPTSTATUS  Cardiac Enzymes: No results for input(s): CKTOTAL, CKMB, CKMBINDEX, TROPONINI in the last 168 hours. CBG: No results for input(s): GLUCAP in the last 168 hours. Iron Studies: No results for input(s): IRON, TIBC, TRANSFERRIN, FERRITIN in the last 72 hours. @lablastinr3 @ Studies/Results: No results found. Medications:   . amLODipine  10 mg Oral QHS  . calcitRIOL  2 mcg Oral Q M,W,F-HD  . cinacalcet  90 mg Oral Q breakfast  . colchicine  0.3 mg Oral Once per day on Mon Thu  . ferric gluconate (FERRLECIT/NULECIT) IV  62.5 mg Intravenous Q Wed-HD  . FLUoxetine  20 mg Oral Daily  . heparin  5,000 Units Subcutaneous 3 times per day  . isosorbide mononitrate  30 mg Oral Daily  . lamiVUDine  25 mg Oral Daily  . lidocaine  1 patch Transdermal Q24H  . multivitamin  1 tablet Oral QHS  . nicotine  7 mg Transdermal Daily  . pantoprazole  40 mg Oral Daily  . polyethylene glycol  17 g Oral Daily  . raltegravir  400 mg Oral BID  . sevelamer carbonate  2,400 mg Oral TID WC  . sodium chloride  3 mL Intravenous Q12H  . tenofovir  300 mg Oral Q Fri

## 2014-08-25 NOTE — Progress Notes (Signed)
Patient Discharge:  Disposition: home Surveyor, quantity([place])  Education: Educated patient on discharge instructions.  Patient verbalized understanding but refused to sign AVS.    IV: none  Telemetry: n/a  Transportation:  EMS provided transportation to patient home  Belongings: gathered by patient.

## 2014-08-25 NOTE — Progress Notes (Signed)
Physical Therapy Discharge Patient Details Name: Zettie CooleyMargaret Ann Aamodt MRN: [id number] DOB: 10-May-1951 Today's Date: 08/25/2014 Time:  -     Patient discharged from PT services secondary to patient has refused 3 (three) consecutive times without medical reason.  Please see latest therapy progress note for current level of functioning and progress toward goals.    Progress and discharge plan discussed with patient and/or caregiver: Pt became agitated when asked about participating with physical therapy. Pt upset about pending discharge plans and states she "will start to think about therapy when her home situation is worked out." PT signing off.   GP     Conni SlipperKirkman, Avontae Burkhead 08/25/2014, 10:36 AM   Conni SlipperLaura Raegan Sipp, PT, DPT Acute Rehabilitation Services Pager: [phone]

## 2014-08-25 NOTE — Care Management Note (Signed)
CARE MANAGEMENT NOTE 08/25/2014  Patient:  [name],[name]   Account Number:  [id number]  Date Initiated:  08/11/2014  Documentation initiated by:  Tacy Chavis  Subjective/Objective Assessment:   CM following for progession and d/c planning.     Action/Plan:   Per pt Renal MD, this pt needs to be placed in Outpatient HD center [facility] [place], per pt wishes. Call placed to that facility and await response.  08/17/14 CSW attempting to find SNF.   Anticipated DC Date:     Anticipated DC Plan:  HOME/SELF CARE         Choice offered to / List presented to:             Status of service:  In process, will continue to follow Medicare Important Message given?  YES (If response is "NO", the following Medicare IM given date fields will be blank) Date Medicare IM given:  08/17/2014 Medicare IM given by:  Medora Roorda Date Additional Medicare IM given:   Additional Medicare IM given by:    Discharge Disposition:    Per UR Regulation:    If discussed at Long Length of Stay Meetings, dates discussed:    Comments:   08/25/2014 Pt to be d/c to her daughters [place] in [place] , [place] .All efforts to arrange HD in her area have failed, this includes the center in [facility], [place], [place], [place] and [place]. While she would have transportation to any of these centers , they have declined services due to this pt behaviors and noncompliance. All efforts by CSW, Genelle BalVanessa Crawford to find SNF placement in this area have also failed again due to pt history with local facilitys and noncompliance.So while the pt has a seat at [address] for hemodilay[facility]is, we are unable to provide a SNF and she may live with her daughter [place] but has no local HD center. The pt is responsible for her own transportation [place] for ongoing HD at [address]. While the pt has some family in [place], they are no longer able to provide housing for this patient due to family conflict. TS notified of need  to d/c this pt today , CSW will arrange ambulance transportation to [facility].    [name], case manager, [phone]  08/21/2014 Onogoing efforts to place this pt in an outpatient HD center near her daughter's [place]. All info faxed to [facility] for placement. Six HD centers within a 20-40 mile radius of pt current home were explored and none of the facilities are able to provide services to this pt . Pt has been most upset with this CM as she mistakenly understood that her daughter had been able to find a center for her in [place] , however the pt daughter apparently misunderstood the information she recieved from that facility when she placed a call there. As there are no accepting facilities near the daughters home and the pt has a chair at [facility] [place] her only option at this time is to remain in the [place] area and continue dialyisis in [place]. This CM also spoke with [facility] and they are unable to continue to transport this pt [place], they were very willing to transport to any of the centers in the [place] and [place] area. This information has been given to the attending physicains. Jean Shockheryl Shelina Luo RN [name], case manager, [phone]     08/19/2014 All info obtained and packet  of information faxed to [facility] [place] , for this pt to place in [place] or [place], [place]. Multiple calls placed to [facility] service to confirm that they will transport this pt to either center if we are able to get a spot. I also spoke with [facility] in [place] to try to arrange HD at that location , however they confirmed that the pt may not return to that center. Of concern to this CM is that all of these centers are considered "sister" centers and word of this pt and her behaviors has spread to other centers. Hopefully this will not impact her abillity to be placed at [place] or [place]. Pt today has  refused to be adm to a SNF. The plan was to adm to a local SNF so that she could continue to receive HD [facility] on [address]. Given complexity of trying to place this pt for outpatient HD , I will discuss with hospital administration as  I am unable to justify to pt insurance her ongoing need for acute hospitalization because she refused short term SNF placement while we continue to search for outpatient HD sites that will accept her near her home. Please call for any questions. [name], case manager, [phone]  08/14/2014 Ongoing efforts to place this pt at [facility]. After multiple calls and messages left this CM was finally able to speak with [name] of Central Intake for the [facility]. She agreed to fax the list of information needed for [name] to be reviewed for possible admission. When the list had not arrived by midday, this CM contacted Ms [name] again and she was able to email the information. With the assistance of the HD center secretary , [name], change nurse [name] and the collection of data the infor was assembled and labs ordered that are required by this center. This CM also contacted pts current HD center on [address], [place], [place] and requested the pt plan of care, and other info. [name] , of that center asked for the list to be faxed and has agreed to send every thing that the center has that may be helpful. Currently awaiting info from the HD center ( spoke with [name] again today)  and labs drawn today and will be able to fax all info hopefully at the beginning of the week. In the mean time, the CSW is working with this pt on short term placement her in [place], so that she may continue to get HD as an outpatient here in [place] until the [facility] is able to give us an answer. Will fax all info to Central Intake for consideration when info is received from [facility] on [address]. Jean Shockheryl Moxon Messler RN [name], case manager, [phone]  08/11/2014 Call  placed to nearest HD center in [place], [place] , central intake , [name] @ [phone] and message left re pt and HD needs. Will fax info as soon as call is returned. Fax # [phone].  Will continue to follow and provide info to Central Intake for [facility] central intake.    [name], case manager, [phone]

## 2014-09-01 ENCOUNTER — Ambulatory Visit: Payer: Self-pay | Admitting: Internal Medicine

## 2015-01-26 NOTE — Discharge Summary (Signed)
PATIENT NAME:  [name], [name] MR#:  [id number] DATE OF BIRTH:  07-30-1951  DATE OF ADMISSION:  05/06/2012 DATE OF DISCHARGE:  05/07/2012  PRIMARY CARE PHYSICIAN:  [facility] NEPHR[facility]IST: Mosetta PigeonHarmeet Singh, MD   DISCHARGE DIAGNOSES:  1. Acute pulmonary edema secondary to noncompliance with dialysis.  2. Malignant hypertension.  3. History of HIV.  4. Polysubstance abuse.   CONSULTS:  Dr. Thedore MinsSingh, nephrology.   PROCEDURES: Dialysis times two.   ADMITTING HISTORY AND PHYSICAL: Please see detailed History and Physical dictated previously on 05/06/2012. In brief, 64 year old female patient with history of endstage renal disease on hemodialysis and HIV presented with acute shortness of breath and was found to be in acute pulmonary edema secondary to missing her dialysis with fluid overload and was admitted to the hospitalist service for further management and treatment.   HOSPITAL COURSE:  1. Acute respiratory failure. The patient initially was on a significant amount of oxygen to keep her saturations in the normal range. She had dialysis done during the hospital stay which resolved her acute pulmonary edema. The patient at the time of discharge looks much more comfortable and wants to go home.  2. Endstage renal disease on hemodialysis. The patient has been noncompliant with her dialysis causing the fluid overload. The patient has been counseled to be compliant with her medications and also to stay away from tobacco, alcohol, and polysubstance abuse she indulges in.  The patient has also been noncompliant with her hypertension medications and today her blood pressure is in the normal range after restarting her home medications.  3. On the day of discharge, the patient is doing well. No shortness of breath. No crackles on examination. Blood pressure is in the normal range and she is being discharged home after her dialysis early this morning.   DISCHARGE MEDICATIONS:  1. Lasix 40 mg oral once a day.   2. Clonidine 0.3 mg every week topical patch.  3. Norvasc 10 mg daily.  4. Losartan 100 mg daily.  5. Aspirin 81 mg daily.  6. Citalopram 20 mg oral daily.  7. Xanax 1 mg oral daily as needed.  8. Viread 300 mg oral 1 tablet every Friday.  9. Lamivudine 150 mg oral once a day.  10. Glyburide 5 mg oral 2 times a day.  11. Uloric 40 mg oral once a day.  12. Omeprazole 40 mg oral 2 times a day.  13. Atorvastatin 10 mg oral once a day.  14. Hydroxyurea 500 mg oral 3 times a week on Monday, Wednesday, Friday.   DISCHARGE INSTRUCTIONS: The patient is to follow up with her primary care physician in a week. Follow up with nephrology for dialysis needs. The patient will be on a renal diet with activity as tolerated. This plan was discussed with the patient who verbalized understanding and is okay with the plan.   TIME SPENT: Time spent today on this discharge dictation along with coordinating care and counseling of the patient was 40 minutes.  ____________________________ Molinda BailiffSrikar R. Orson Rho, MD srs:bjt D: 05/07/2012 13:28:10 ET T: 05/08/2012 11:09:45 ET JOB#: [id number]  cc: Wardell HeathSrikar R. Worley Radermacher, MD, <Dictator> [facility] Orie FishermanSRIKAR R Adamae Ricklefs MD ELECTRONICALLY SIGNED 05/08/2012 14:01

## 2015-01-26 NOTE — H&P (Signed)
PATIENT NAME:  [name], [name] MR#:  [id number] DATE OF BIRTH:  September 11, 1951  DATE OF ADMISSION:  05/06/2012  PRIMARY CARE PHYSICIAN: [facility]   NEPHROLOGIST: Mosetta Pigeon, MD    CHIEF COMPLAINT: Shortness of breath.   HISTORY OF PRESENT ILLNESS: The patient is a 64 year old female with a history of end-stage renal disease, on hemodialysis, and HIV who came in because the patient was feeling poorly and missed dialysis on Tuesday, Thursday, and Saturday of the last week; and the patient was found to have shortness of breath and had acute pulmonary edema with hyperkalemia. The patient is referred for urgent hemodialysis. Dr. Thedore Mins has already put in orders. I asked to admit the patient for acute pulmonary edema. The patient missed dialysis last week because her husband passed away and was buried last Saturday. The patient denies any fever but is having diarrhea since this morning. He feels chills but no nausea or vomiting.   PAST MEDICAL HISTORY: Significant for: 1. History of diastolic heart failure and hypertension.  2. Polysubstance abuse.  3. Endstage renal disease on hemodialysis. The patient is on dialysis Tuesday, Thursday, and Saturday, missed sessions last week.  4. HIV.  5. Diabetes, which is diet controlled.   6. History  of anxiety.   ALLERGIES: Morphine.   PAST SURGICAL HISTORY:  1. Left upper extremity AV fistula. 2. Appendectomy.  3. Cesarean section.   SOCIAL HISTORY: She smokes 3 packs per day. Occasional alcohol. Past history of marijuana and  cocaine use.  FAMILY HISTORY: Mother and father died of stroke and also family history of end-stage renal disease in the family.   MEDICATIONS: The patient is unable to tell me the medication list because she is slightly lethargic and having signs of uremic encephalopathy. The patient's medications from the last discharge shows: 1. Lasix 40 mg daily. 2. Clonidine 0.3 mg every week, that is a patch. 3. Norvasc  10 mg daily. 4. Losartan 100 mg daily. 5. Aspirin 81 mg daily.  6. Regarding her HIV, the patient is not on any medication. [facility], so we have no medications for HIV. The patient's medications are to be reviewed after she gets dialysis and her mental status gets clear.   REVIEW OF SYSTEMS: CONSTITUTIONAL: Feels chills, no weakness and feels fatigued. EYES: No blurred vision. ENT: No tinnitus. No epistaxis. No difficulty swallowing. RESPIRATORY: Shortness of breath  present. No cough. CARDIOVASCULAR: No chest pain. The patient has pedal edema. No orthopnea. GASTROINTESTINAL: Has diarrhea since this morning and also some nausea. No gastroesophageal reflux disease. GENITOURINARY: No dysuria. ENDOCRINE: No polyuria or nocturia. INTEGUMENTARY: No skin rashes. MUSCULOSKELETAL: No joint pains. PSYCHOLOGICAL: Anxiety present. NEUROLOGIC: No numbness or weakness.   PHYSICAL EXAMINATION:  GENERAL: This is a 64 year old African American female in slight distress with shortness of breath and says he is having chills. The patient is awake and able to answer questions appropriately but going back to sleep immediately.   VITAL SIGNS: Temperature 96, pulse 86, respirations 32, blood pressure initially 178/87,  repeat 226/113. During my visit, blood pressure was 190/127, pulse 100. The patient's saturations are around 95% on room air.   HEENT: Head atraumatic, normocephalic. Pupils are equally reacting to light. Extraocular movements are intact. ENT: No tympanic membrane congestion. No turbinate hypertrophy. No oropharyngeal erythema.   NECK: Normal range of motion. No JVD. No carotid bruit.   CARDIOVASCULAR: S1, S2 regular. No murmurs.   LUNGS: Decreased breath sounds bilaterally with basilar crepitations.  ABDOMEN: Soft. Bowel sounds present. No organomegaly.   MUSCULOSKELETAL: Strength five out of five upper and lower extremities.   SKIN: No skin rashes.   NEUROLOGIC: The patient is with  cranial nerves II through XII intact. No sensory or motor deficit.   PSYCHIATRIC: The patient is oriented to time, place, and person.   LABORATORY DATA: Troponin less than 0.02.   ELECTROLYTES: Sodium 136, potassium 6.6, chloride 105, bicarbonate 15, BUN 134, creatinine 13.27, glucose 86, bicarbonate is 15. WBC 9.4, hemoglobin 9.9, hematocrit 29.8, platelets 235. CK total 127, CPK-MB 1.6. Chest x-ray showed no active disease. No infiltrates. The patient's EKG showed normal sinus rhythm. No ST-T changes. The patient's rate is 85 beats per minute.   ASSESSMENT AND PLAN: The patient is a 64 year old female with: 1. End-stage renal disease: She is now in acute pulmonary edema secondary to missed hemodialysis sessions due to family reasons. The patient also has uremic encephalopathy. She is going to have urgent dialysis, and Dr. Thedore MinsSingh has already arranged for dialysis. She received Kayexalate along with insulin, and dextrose, and calcium gluconate in the Emergency Room. Repeat the potassium levels after dialysis.  2. Malignant hypertension secondary to missed hemodialysis sessions: Restart the clonidine patch and Norvasc and recheck the blood pressure and monitor her on telemetry. Continue the losartan and Lasix.  3. History of HIV: The patient is not on medications, and [facility], so I will request the records from them.  4. GI prophylaxis and deep vein thrombosis prophylaxis.  5. Polysubstance abuse: The patient was counseled about smoking cessation and also crack use. We will get urine toxicology again. The patient was here in June, and at that time she was positive for cocaine.  6. Diarrhea and chills: We will check the stool for Clostridium difficile and also follow the blood cultures.   ____________________________ Katha HammingSnehalatha Shatavia Santor, MD sk:cbb D: 05/06/2012 14:43:37 ET T: 05/06/2012 16:23:19 ET JOB#: [id number]  cc: Katha HammingSnehalatha Alieah Brinton, MD, <Dictator> [facility]  Internal Medicine, Infectious Disease  Katha HammingSNEHALATHA Noland Pizano MD ELECTRONICALLY SIGNED 05/15/2012 14:10
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
## 2015-01-30 NOTE — Discharge Summary (Signed)
PATIENT NAME:  [name], [name] MR#:  [id number] DATE OF BIRTH:  12-03-1950  DATE OF ADMISSION:  01/31/2014 DATE OF DISCHARGE:  02/04/2014  PRIMARY CARE PHYSICIAN: Nonlocal.  DISCHARGE DIAGNOSES: 1. Pneumonia.  2. Right lower lobe pneumonia with right infrahilar mass.  3. Hyperkalemia.  4. End-stage renal disease.  5. Chronic obstructive pulmonary disease exacerbation.  6. Diastolic congestive heart failure.  7. Hypertension.  8. Diabetes.  9. Tobacco abuse.   CONDITION: Stable.   CODE STATUS: Full code.   MEDICATIONS: Please refer to the medication reconciliation list.   DIET: Low-sodium, low-fat, low-cholesterol, ADA renal diet.   ACTIVITY: As tolerated.   FOLLOW-UP CARE: Follow with PCP within 1 to 2 weeks. Follow up Dr. Sampson GoonFitzgerald within 1 to 2 weeks. Follow up Dr. Cherylann RatelLateef within 1 to 2 weeks. In addition, the patient then needs to follow up with Dr. Welton FlakesKhan, pulmonary specialist to repeat a chest x-ray or CAT scan of chest for possible lung mass.   The patient's losartan was discontinued due to hyperglycemia. We started hydralazine 25 mg p.o. every eight hours and Imdur 30 mg p.o. daily.   REASON FOR ADMISSION: Right-sided chest pain, cough, and shortness of breath.   HOSPITAL COURSE: The patient is a 64 year old African American female with a history of hypertension, diastolic CHF, ESRD, was sent from skilled nursing facility due to right side chest pain, cough with shortness of breath for two days. The patient's chest x-ray in ED showed right lower lobe mass versus cavitary lesion. A CAT scan showed right lower lobe pneumonia with right infrahilar mass. The patient's WBC 29,000. In addition, the patient had tachycardia. For detailed history and physical examination, please refer to the admission note dictated by Dr. Elpidio AnisSudini.   Laboratory data on admission date showed WBC 29.6, hemoglobin 10.5, platelets 392, BUN 56, creatinine 10.78, sodium 126, potassium 6.6, bicarbonate 28. EKG  showed normal sinus rhythm  with first degree AV block. Chest x-ray and the CAT scan of chest as mentioned above.   1. Right lower lobe pneumonia with right inferior hilar mass. After admission, the patient was treated with vancomycin, Zosyn and Zithromax. Since the patient has a history of HIV, we requested infectious disease consult. Dr. Sampson GoonFitzgerald evaluated the patient and suggested to continue above mentioned antibiotics. The patient's blood culture showed streptococcus pneumonia sensitive to Levaquin. Dr. Sampson GoonFitzgerald suggested changing the antibiotics to Levaquin q.48 hours for a total of 14 days. The patient will continue p.o. Levaquin for additional 10 days.   The patient's WBC decreased to normal range after antibiotic treatment. The last WBC was 11.6. The patient was scheduled to have a bronchoscopy in the hospital due to suspected lung cancer and obstructive pneumonia. Since the patient's white count decreased to normal range after antibiotic treatment, Dr. Welton FlakesKhan canceled bronchoscopy and the patient may follow up with Dr. Welton FlakesKhan as outpatient.  2. Hyperkalemia. The patient's potassium was high after treatment with hemodialysis. Potassium is normal.  3. Chronic obstructive pulmonary disease exacerbation. The patient was treated with IV Solu-Medrol on admission date, and second day the patient's shortness of breath was much improved, so IV steroid was discontinued. The patient has been treated with nebulizer.  4. Anemia has been stable.  5. Diastolic congestive heart failure has been stable.  6. Human immunodeficiency virus. The patient has been treated with HIV medication. The patient needs to follow up with Dr. Sampson GoonFitzgerald as outpatient.  7. Hypertension. Since the patient has hyperkalemia, the patient's losartan was discontinued. The patient  has been treated with the Norvasc, hydralazine, and the imdur.  8. Diabetes. Has been treated with sliding scale. Hemoglobin A1c is normal.  6. Tobacco abuse.  The patient was counseled for smoking cessation. Nicotine patch was given.  The patient has no complaints today. Physical examination is unremarkable. Vital signs are stable. The patient is clinically stable and will be discharged to a skilled nursing facility today. I discussed the patient's discharge plan with the patient, nurse and social worker.   TIME SPENT: About 38 minutes.   ____________________________ Shaune Pollack, MD qc:sg D: 02/04/2014 12:11:00 ET T: 02/04/2014 12:41:15 ET JOB#: [id number]  cc: Shaune Pollack, MD, <Dictator> Shaune Pollack MD ELECTRONICALLY SIGNED 02/04/2014 15:23

## 2015-01-30 NOTE — Op Note (Signed)
PATIENT NAME:  [name], Shatonya A MR#:  [id number] DATE OF BIRTH:  10/28/1950  DATE OF PROCEDURE:  04/06/2014  PREOPERATIVE DIAGNOSES: 1.  End-stage renal disease with diminished function of left arm arteriovenous fistula.  2.  Hypertension.  3.  Human immunodeficiency virus infection.  4.  Congestive heart failure.   POSTOPERATIVE DIAGNOSES:  1.  End-stage renal disease with diminished function of left arm arteriovenous fistula.  2.  Hypertension.  3.  Human immunodeficiency virus infection.  4.  Congestive heart failure.   PROCEDURES PERFORMED: 1.  Ultrasound guidance for vascular access to left radiocephalic arteriovenous fistula.  2.  Left upper extremity fistulogram and central venogram.  3.  Percutaneous transluminal angioplasty of perianastomotic stenosis with 6 mm diameter angioplasty balloon.   SURGEON: Annice NeedyJason S. Dew, M.D.   ANESTHESIA: Local with moderate conscious sedation.   ESTIMATED BLOOD LOSS: Minimal.   FLUOROSCOPY TIME: Approximately 2 minutes.  CONTRAST USED: 27 mL.  INDICATION FOR PROCEDURE: This is a 64 year old female with multiple medical issues. She has end-stage renal disease. Her dialysis access center has noticed diminished flows with her AV fistula and we are asked to assess this. Risks and benefits were discussed for fistulogram and informed consent was obtained.   DESCRIPTION OF PROCEDURE: The patient is brought to the vascular suite. The left upper extremity is sterilely prepped and draped and a sterile surgical field was created. The fistula was accessed just below the antecubital fossa in retrograde fashion under direct ultrasound guidance, and a permanent image was recorded. I then placed a micropuncture wire and micropuncture sheath and upsized to a 6-French sheath. The patient was given 3000 units of intravenous heparin. A Kumpe catheter was placed into the radial artery for evaluation of the perianastomotic region. A moderate stenosis in the 60 to 65%  range was seen in the cephalic vein just beyond the anastomosis, in the first 1 to 2 cm, from intimal hyperplasia. The remainder of the forearm fistula appeared patent with some mild irregularity from the buttonhole sites. There was dual outflow in the upper arm through the cephalic vein and the basilic vein, and the central venous circulation was patent. I replaced the Magic torque wire. I used a 6 mm diameter x 6 cm in length angioplasty balloon taken to the anastomosis with the distal end just at the radial artery anastomosis and this encompassed the lesion. A waste was taken which resolved at about 12 atmospheres and completion angiogram showed improved flow with about a 25 to 30% residual stenosis, which was not flow-limiting. I did not want to inflate larger than this size at the perianastomotic region. At this point, I elected to terminate the procedure. The sheath was removed, 4-0 Monocryl pursestring suture was placed, pressure was held, and sterile dressing was placed. The patient tolerated the procedure well and was taken to the recovery room in stable condition.   ____________________________ Annice NeedyJason S. Dew, MD jsd:sb D: 04/06/2014 09:42:38 ET T: 04/06/2014 10:10:47 ET JOB#: [id number]  cc: Annice NeedyJason S. Dew, MD, <Dictator> Annice NeedyJASON S DEW MD ELECTRONICALLY SIGNED 04/13/2014 15:10

## 2015-01-30 NOTE — Consult Note (Signed)
PATIENT NAME:  [name], Brinly MR#:  [id number] DATE OF BIRTH:  October 05, 1951  DATE OF CONSULTATION:  02/02/2014  REFERRING PHYSICIAN:  Dr. Imogene Burnhen.  CONSULTING PHYSICIAN:  Stann MainlandDavid P. Sampson GoonFitzgerald, MD  REASON FOR CONSULTATION: HIV, pneumonia and bacteremia.   HISTORY OF PRESENT ILLNESS: This is a pleasant 64 year old female with history of end-stage renal disease on dialysis Tuesday, Thursday, Saturday as well as HIV, previously relatively well controlled. She was admitted April 25th with right-sided chest pain, cough and shortness of breath. She had a white count 29,000 at that time. She had evidence of a pneumonia on her chest CT with possible mass as well causing obstruction. She has been seen by pulmonary. Blood cultures so far are growing 1 to 2 gram-positive cocci. There is also sputum cultures that are pending.   Per the patient, her HIV is well controlled although she has not [facility] almost a [facility]. She says she is living at an assisted living facility and they give her her medications and she reports 100% compliance. She reports she thinks her last CD4 was  over 1000. Since admission, she does report feeling much better and that her cough is decreasing. She is still having some sweats and feels a chill.    PAST MEDICAL HISTORY: 1.  [facility]  2.  End-stage renal disease, on dialysis Tuesday, Thursday, Saturday.  3.  Polysubstance abuse including cocaine and marijuana.  4.  Ongoing tobacco abuse.  5.  Hypertension.  6.  Diastolic congestive heart failure.  7.  Diabetes, diet controlled.  8.  Anxiety. 9.  Chronic pain.  10.  Osteoarthritis.  11.  Morbid obesity.   PAST SURGICAL HISTORY: C-section x 2, dialysis fistula creation.   SOCIAL HISTORY: The patient continues to smoke 5 to 10 cigarettes a day. Past history of marijuana and cocaine use. No alcohol. She resides at [facility].   ALLERGIES: MORPHINE AND TRAMADOL WHICH CAUSED DROWSINESS.    FAMILY HISTORY: Mother and father both had strokes.   MEDICATIONS AS AN OUTPATIENT: She is on Epivir and Isentress and Viread for her HIV. These are renally dosed. Since admission, the patient has been on broad-spectrum antibiotics for her pneumonia and including vancomycin given at dialysis as well as azithromycin and Zosyn.   REVIEW OF SYSTEMS: Eleven systems reviewed and negative except as per history of present illness.   PHYSICAL EXAMINATION: VITAL SIGNS: Temperature 97.9, pulse 111, blood pressure 145/85, respirations 18, sat 95% on room air.  GENERAL: She is morbidly obese, lying in bed, interactive, in no acute distress.  HEENT: Pupils equal, round, reactive to light and accommodation. Extraocular movements are intact. Sclerae anicteric. Oropharynx clear.  NECK: Supple.  HEART: Tachy but regular.  LUNGS: Have decreased breath sounds right base, otherwise relatively clear.  ABDOMEN: Obese, soft, nontender.  EXTREMITIES: No clubbing, cyanosis or edema. Vascular access in her left upper extremity. She has an AV access site that has decent thrill, no tenderness.  NEUROLOGIC: She is alert and oriented x 3. Grossly nonfocal neuro exam.   LABORATORY, DIAGNOSTIC AND RADIOLOGICAL DATA: White blood count on admission was 29.6, currently it is 23.6; hemoglobin 9.2, platelets 350. Renal function is consistent with end-stage renal disease. LFTs show an elevated alkaline phosphatase of 224, otherwise AST and ALT are normal. Albumin is low at 2.9. Blood cultures growing 1 of 2 gram-positive cocci, ID to follow. This is in of 1 of 2 blood cultures within both aerobic and anaerobic  bottles. This is gram-positive cocci in pairs and chains. Sputum cultures holding for possible pathogen. Imaging: CT of her chest done April 25th showed consolidation in the superior segment of the right liver lobe and a 3 mm focus in the consolidation that could represent a tiny foreign body. There is adenopathy which is  likely reactive. There is a 17 mm right infrahilar mass, likely an enlarged lymph node. There is distention of the gallbladder.   IMPRESSION: A 64 year old with human immunodeficiency virus, previously well controlled on medications, as well as end-stage renal disease and polysubstance abuse admitted with fever and a cough and increased white count. She has pneumonia on her CT scan with possible mass as well.   RECOMMENDATIONS: 1.  Continue Vanco and Zosyn and azithromycin. She is at risk of healthcare-associated pneumonia given her frequent dialysis visits.  2.  Would do a bronchoscopy to further evaluate for any mass or lesions.  3.  Continue her HIV meds which are renally dosed. I will confirm with her outpatient HIV doctors at [facility] what her last CD4 is.  I have also ordered a CD4 and viral load today.   Thank you for the consult. I will be glad to follow with you.   ____________________________ Stann Mainland. Sampson Goon, MD dpf:cs D: 02/02/2014 16:27:00 ET T: 02/02/2014 18:07:52 ET JOB#: [id number]  cc: Stann Mainland. Sampson Goon, MD, <Dictator> Trenyce Loera Sampson Goon MD ELECTRONICALLY SIGNED 02/08/2014 04:54

## 2015-01-31 NOTE — Discharge Summary (Signed)
PATIENT NAME:  [name], [name] MR#:  [id number] DATE OF BIRTH:  11-Nov-1950  DATE OF ADMISSION:  03/13/2012 DATE OF DISCHARGE:  03/15/2012  PRIMARY CARE PHYSICIAN: [facility]  NEPHROLOGIST: Dr. Thedore MinsSingh   FINAL DIAGNOSES:  1. Acute respiratory failure.  2. Acute diastolic congestive heart failure.  3. Supraventricular tachycardia.  4. Malignant hypertension.  5. Polysubstance abuse and tobacco abuse.  6. End-stage renal disease on hemodialysis.  7. Human immunodeficiency virus.   MEDICATIONS: I have advised the patient to bring in all of her medications [facility] her next dialysis appointment to confirm medications. The patient did not bring in her medications nor did any family members. We tried obtaining records from her prior dialysis facility and they did not send over her medications. I [facility] but nothing came prior to the time of discharge. Further reconciliation of medications must be done at dialysis on Monday. The medications that I discharged her on were the medications that we controlled blood pressure with here in the hospital. 1. Lasix 40 mg p.o. daily. 2. Clonidine patch 0.3 mg chest wall weekly. 3. Norvasc 10 mg p.o. daily.  4. Losartan 100 mg p.o. daily.  5. Aspirin 81 mg p.o. daily.   NOTE: Do not take cocaine. Do not smoke cigarettes.   DIET: Low sodium diet.   ACTIVITY: Activity as tolerated.   FOLLOW UP: Follow up with [facility] dialysis Monday, Wednesday, Friday. [facility]    REASON FOR ADMISSION: Patient was admitted 03/13/2012, discharged 03/15/2012. Patient came in with palpitations and shortness of breath.   HISTORY OF PRESENT ILLNESS: This is a 64 year old woman with hypertension, HIV, diabetes, end-stage renal disease, palpitations preceded the shortness of breath. She had chest pain. She used crack cocaine. Initial evaluation was tachycardic in the 160s, received  adenosine and broke her tachycardia and then sinus rhythm in the 90s and low 100s. She was admitted for respiratory distress, volume overload, congestive heart failure, supraventricular tachycardia and malignant hypertension and polysubstance abuse.   LABORATORY, DIAGNOSTIC AND RADIOLOGICAL DATA: EKG showed supraventricular tachycardia, 167 beats per minute. TSH 0.41. Troponin negative. Magnesium 2.1. White blood cell count 8.4, hemoglobin and hematocrit 11.0 and 33.7, platelet count 226, glucose 132, BUN 80, creatinine 12.5, sodium 140, potassium 4.7, chloride 107, CO2 18, calcium 8.9. Liver function tests: Alkaline phosphatase elevated at 419, ALT 60, AST 121. BNP 35,626. Chest x-ray showed bilateral diffuse interstitial thickening related to interstitial edema. Urine toxicology positive for cocaine. Urinalysis 100 mg/dL protein, 841150 mg/dL of glucose. Echocardiogram showed an ejection fraction of greater than 55%, impaired left ventricular relaxation. Right ventricular systolic pressure elevated consistent with mild pulmonary hypertension. Cardiac enzymes remain negative. Parathyroid hormone 869, folic acid 16.1, free thyroxine 0.88, LDL 68, HDL 36, triglycerides 159. Hemoglobin A1c 4.6. Hepatitis B surface antigen negative, B12 291, free T3 2.6.   HOSPITAL COURSE PER PROBLEM LIST:  1. For the patient's acute respiratory failure, patient went home off oxygen. This had improved during the hospital stay.  2. For acute diastolic congestive heart failure, most likely with noncompliance with the dialysis, dialysis should help out with fluid management if she goes. Beta blocker contraindicated secondary to cocaine abuse. Until she proves that she can stay off cocaine I will not prescribe beta blocker. Losartan given for congestive heart failure but I think the main thing here is following up with dialysis to maintain fluid status.  3. Supraventricular tachycardia, broke with adenosine. Will not give beta blocker  secondary to cocaine abuse. Clonidine does also help out with heart rate. Can consider Cardizem if this returns.  4. Malignant hypertension, probably worsened with the heart failure. Blood pressure upon discharge was 134/88 and that was on the current medications that I discharged her on.  5. Polysubstance abuse and tobacco abuse. I advised against cocaine. Cocaine can cause fatal arrhythmia and death and crack cocaine lung. For her tobacco abuse, patient did not want a nicotine patch. Smoking cessation also advised.  6. For her end-stage renal disease she will follow up with Dr. Thedore Mins and [facility] dialysis. Medication reconciliation must be done at that time.  7. HIV. Follows at the [facility] clinic. Unknown what medications she is on for this. This needs to be clarified at the next follow-up appointment.   TIME SPENT ON DISCHARGE: 35 minutes.  ____________________________ Herschell Dimes. Renae Gloss, MD rjw:cms D: 03/16/2012 15:32:25 ET T: 03/18/2012 12:09:25 ET JOB#: [id number]  cc: Herschell Dimes. Renae Gloss, MD, <Dictator> [facility] Mosetta Pigeon, MD  Salley Scarlet MD ELECTRONICALLY SIGNED 03/23/2012 13:06
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
## 2015-02-08 ENCOUNTER — Encounter (HOSPITAL_COMMUNITY): Payer: Self-pay | Admitting: *Deleted

## 2015-02-08 ENCOUNTER — Observation Stay (HOSPITAL_COMMUNITY)
Admission: EM | Admit: 2015-02-08 | Discharge: 2015-02-09 | Disposition: A | Discharge status: Home / Self Care | Payer: Medicare Other | Attending: Internal Medicine | Admitting: Internal Medicine

## 2015-02-08 ENCOUNTER — Emergency Department (HOSPITAL_COMMUNITY): Payer: Medicare Other

## 2015-02-08 DIAGNOSIS — I120 Hypertensive chronic kidney disease with stage 5 chronic kidney disease or end stage renal disease: Secondary | ICD-10-CM | POA: Diagnosis not present

## 2015-02-08 DIAGNOSIS — N2581 Secondary hyperparathyroidism of renal origin: Secondary | ICD-10-CM | POA: Diagnosis not present

## 2015-02-08 DIAGNOSIS — R0789 Other chest pain: Secondary | ICD-10-CM | POA: Insufficient documentation

## 2015-02-08 DIAGNOSIS — G4700 Insomnia, unspecified: Secondary | ICD-10-CM | POA: Insufficient documentation

## 2015-02-08 DIAGNOSIS — Z21 Asymptomatic human immunodeficiency virus [HIV] infection status: Secondary | ICD-10-CM | POA: Diagnosis not present

## 2015-02-08 DIAGNOSIS — Z992 Dependence on renal dialysis: Secondary | ICD-10-CM | POA: Insufficient documentation

## 2015-02-08 DIAGNOSIS — R109 Unspecified abdominal pain: Secondary | ICD-10-CM | POA: Diagnosis present

## 2015-02-08 DIAGNOSIS — F17210 Nicotine dependence, cigarettes, uncomplicated: Secondary | ICD-10-CM

## 2015-02-08 DIAGNOSIS — Z590 Homelessness: Secondary | ICD-10-CM | POA: Insufficient documentation

## 2015-02-08 DIAGNOSIS — R739 Hyperglycemia, unspecified: Secondary | ICD-10-CM | POA: Diagnosis not present

## 2015-02-08 DIAGNOSIS — E875 Hyperkalemia: Secondary | ICD-10-CM | POA: Diagnosis present

## 2015-02-08 DIAGNOSIS — Z87898 Personal history of other specified conditions: Secondary | ICD-10-CM

## 2015-02-08 DIAGNOSIS — R0602 Shortness of breath: Secondary | ICD-10-CM

## 2015-02-08 DIAGNOSIS — F419 Anxiety disorder, unspecified: Secondary | ICD-10-CM | POA: Diagnosis not present

## 2015-02-08 DIAGNOSIS — R1012 Left upper quadrant pain: Secondary | ICD-10-CM | POA: Diagnosis not present

## 2015-02-08 DIAGNOSIS — N186 End stage renal disease: Secondary | ICD-10-CM | POA: Insufficient documentation

## 2015-02-08 DIAGNOSIS — R197 Diarrhea, unspecified: Secondary | ICD-10-CM | POA: Diagnosis present

## 2015-02-08 DIAGNOSIS — D539 Nutritional anemia, unspecified: Secondary | ICD-10-CM | POA: Diagnosis present

## 2015-02-08 DIAGNOSIS — R112 Nausea with vomiting, unspecified: Secondary | ICD-10-CM | POA: Insufficient documentation

## 2015-02-08 DIAGNOSIS — R9431 Abnormal electrocardiogram [ECG] [EKG]: Secondary | ICD-10-CM | POA: Diagnosis present

## 2015-02-08 DIAGNOSIS — F1410 Cocaine abuse, uncomplicated: Secondary | ICD-10-CM

## 2015-02-08 DIAGNOSIS — Z8659 Personal history of other mental and behavioral disorders: Secondary | ICD-10-CM

## 2015-02-08 DIAGNOSIS — R079 Chest pain, unspecified: Secondary | ICD-10-CM | POA: Diagnosis present

## 2015-02-08 DIAGNOSIS — F1911 Other psychoactive substance abuse, in remission: Secondary | ICD-10-CM

## 2015-02-08 HISTORY — DX: Disorder of kidney and ureter, unspecified: N28.9

## 2015-02-08 HISTORY — DX: Other psychoactive substance abuse, uncomplicated: F19.10

## 2015-02-08 HISTORY — DX: Asymptomatic human immunodeficiency virus (hiv) infection status: Z21

## 2015-02-08 HISTORY — DX: Human immunodeficiency virus (HIV) disease: B20

## 2015-02-08 LAB — CBG MONITORING, ED
Glucose-Capillary: 109 mg/dL — ABNORMAL HIGH (ref 70–99)
Glucose-Capillary: 60 mg/dL — ABNORMAL LOW (ref 70–99)

## 2015-02-08 LAB — COMPREHENSIVE METABOLIC PANEL
ALK PHOS: 415 U/L — AB (ref 38–126)
ALT: 19 U/L (ref 14–54)
ANION GAP: 17 — AB (ref 5–15)
AST: 26 U/L (ref 15–41)
Albumin: 3.5 g/dL (ref 3.5–5.0)
BILIRUBIN TOTAL: 0.6 mg/dL (ref 0.3–1.2)
BUN: 92 mg/dL — ABNORMAL HIGH (ref 6–20)
CHLORIDE: 102 mmol/L (ref 101–111)
CO2: 17 mmol/L — AB (ref 22–32)
Calcium: 6.2 mg/dL — CL (ref 8.9–10.3)
Creatinine, Ser: 12.31 mg/dL — ABNORMAL HIGH (ref 0.44–1.00)
GFR, EST AFRICAN AMERICAN: 3 mL/min — AB (ref >60)
GFR, EST NON AFRICAN AMERICAN: 3 mL/min — AB (ref >60)
GLUCOSE: 133 mg/dL — AB (ref 70–99)
SODIUM: 136 mmol/L (ref 135–145)
TOTAL PROTEIN: 7.1 g/dL (ref 6.5–8.1)

## 2015-02-08 LAB — CBC WITH DIFFERENTIAL/PLATELET
Basophils Absolute: 0 10*3/uL (ref 0.0–0.1)
Basophils Relative: 0 % (ref 0–1)
Eosinophils Absolute: 0 10*3/uL (ref 0.0–0.7)
Eosinophils Relative: 1 % (ref 0–5)
HCT: 36.2 % (ref 36.0–46.0)
HEMOGLOBIN: 11.8 g/dL — AB (ref 12.0–15.0)
LYMPHS PCT: 23 % (ref 12–46)
Lymphs Abs: 1.9 10*3/uL (ref 0.7–4.0)
MCH: 34.4 pg — AB (ref 26.0–34.0)
MCHC: 32.6 g/dL (ref 30.0–36.0)
MCV: 105.5 fL — ABNORMAL HIGH (ref 78.0–100.0)
Monocytes Absolute: 0.6 10*3/uL (ref 0.1–1.0)
Monocytes Relative: 7 % (ref 3–12)
Neutro Abs: 5.7 10*3/uL (ref 1.7–7.7)
Neutrophils Relative %: 69 % (ref 43–77)
PLATELETS: 263 10*3/uL (ref 150–400)
RBC: 3.43 MIL/uL — ABNORMAL LOW (ref 3.87–5.11)
RDW: 13.4 % (ref 11.5–15.5)
WBC: 8.1 10*3/uL (ref 4.0–10.5)

## 2015-02-08 LAB — LIPASE, BLOOD: Lipase: 34 U/L (ref 22–51)

## 2015-02-08 LAB — TROPONIN I: Troponin I: <0.03 ng/mL (ref <0.031)

## 2015-02-08 MED ORDER — SODIUM BICARBONATE 8.4 % IV SOLN
50.0000 meq | Freq: Once | INTRAVENOUS | Status: AC
  Administered 2015-02-08: 50 meq via INTRAVENOUS
  Filled 2015-02-08: qty 50

## 2015-02-08 MED ORDER — PENTAFLUOROPROP-TETRAFLUOROETH EX AERO
1.0000 "application " | INHALATION_SPRAY | CUTANEOUS | Status: DC | PRN
  Filled 2015-02-08: qty 30

## 2015-02-08 MED ORDER — LIDOCAINE-PRILOCAINE 2.5-2.5 % EX CREA
1.0000 "application " | TOPICAL_CREAM | CUTANEOUS | Status: DC | PRN
  Filled 2015-02-08: qty 5

## 2015-02-08 MED ORDER — DEXTROSE 50 % IV SOLN
50.0000 mL | Freq: Once | INTRAVENOUS | Status: AC
  Administered 2015-02-08: 50 mL via INTRAVENOUS
  Filled 2015-02-08: qty 50

## 2015-02-08 MED ORDER — DARBEPOETIN ALFA 100 MCG/0.5ML IJ SOSY
PREFILLED_SYRINGE | INTRAMUSCULAR | Status: AC
  Administered 2015-02-08: 100 ug
  Filled 2015-02-08: qty 0.5

## 2015-02-08 MED ORDER — ALTEPLASE 2 MG IJ SOLR
2.0000 mg | Freq: Once | INTRAMUSCULAR | Status: AC | PRN
  Filled 2015-02-08: qty 2

## 2015-02-08 MED ORDER — CAMPHOR-MENTHOL 0.5-0.5 % EX LOTN
1.0000 "application " | TOPICAL_LOTION | Freq: Three times a day (TID) | CUTANEOUS | Status: DC | PRN
  Filled 2015-02-08: qty 222

## 2015-02-08 MED ORDER — HEPARIN SODIUM (PORCINE) 1000 UNIT/ML DIALYSIS
4000.0000 [IU] | INTRAMUSCULAR | Status: DC | PRN
  Filled 2015-02-08: qty 4

## 2015-02-08 MED ORDER — CALCIUM GLUCONATE 10 % IV SOLN
1.0000 g | Freq: Once | INTRAVENOUS | Status: DC
  Filled 2015-02-08: qty 10

## 2015-02-08 MED ORDER — LIDOCAINE HCL (PF) 1 % IJ SOLN: 5.0000 mL | INTRAMUSCULAR | Status: DC | PRN

## 2015-02-08 MED ORDER — DARBEPOETIN ALFA 100 MCG/0.5ML IJ SOSY
PREFILLED_SYRINGE | INTRAMUSCULAR | Status: AC
Start: 2015-02-08 — End: 2015-02-09
  Filled 2015-02-08: qty 0.5

## 2015-02-08 MED ORDER — DEXTROSE 50 % IV SOLN
1.0000 | Freq: Once | INTRAVENOUS | Status: AC
  Administered 2015-02-08: 50 mL via INTRAVENOUS
  Filled 2015-02-08: qty 50

## 2015-02-08 MED ORDER — HYDROXYZINE HCL 25 MG PO TABS: 25.0000 mg | ORAL_TABLET | Freq: Three times a day (TID) | ORAL | Status: DC | PRN

## 2015-02-08 MED ORDER — PROMETHAZINE HCL 25 MG/ML IJ SOLN
25.0000 mg | Freq: Once | INTRAMUSCULAR | Status: AC
  Administered 2015-02-08: 25 mg via INTRAVENOUS
  Filled 2015-02-08: qty 1

## 2015-02-08 MED ORDER — HEPARIN SODIUM (PORCINE) 1000 UNIT/ML DIALYSIS
1000.0000 [IU] | INTRAMUSCULAR | Status: DC | PRN
  Filled 2015-02-08: qty 1

## 2015-02-08 MED ORDER — SODIUM POLYSTYRENE SULFONATE 15 GM/60ML PO SUSP: 15.0000 g | Freq: Once | ORAL | Status: DC

## 2015-02-08 MED ORDER — SODIUM CHLORIDE 0.9 % IV SOLN: 100.0000 mL | INTRAVENOUS | Status: DC | PRN

## 2015-02-08 MED ORDER — NEPRO/CARBSTEADY PO LIQD
237.0000 mL | ORAL | Status: DC | PRN
  Filled 2015-02-08: qty 237

## 2015-02-08 MED ORDER — INSULIN ASPART 100 UNIT/ML IV SOLN
10.0000 [IU] | Freq: Once | INTRAVENOUS | Status: AC
  Administered 2015-02-08: 10 [IU] via INTRAVENOUS
  Filled 2015-02-08: qty 1

## 2015-02-08 NOTE — ED Notes (Signed)
Pt placed in a gown and hooked up to monitor with 5 lead, BP cuff and pulse ox 

## 2015-02-08 NOTE — H&P (Signed)
Date: 02/08/2015               Patient Name:  [name] MRN: [id number]  DOB: 28-Jun-1951 Age / Sex: 64 y.o., female   PCP: No primary care provider on file.         Medical Service: Internal Medicine Teaching Service         Attending Physician: Dr. Criselda PeachesMullen, MD    First Contact: Dr. Valentino Saxonothman Pager: [phone]  Second Contact: Dr. Yetta BarreJones Pager: [phone]       After Hours (After 5p/  First Contact Pager: [phone]  weekends / holidays): Second Contact Pager: [phone]   Chief Complaint: Hyperkalemia   History of Present Illness:  64 y.o pmh HIV x 18 years (not sure how contracted), ESRD on HD MWF (fired from several HD centers; on HD x 7 years), anxiety, HTN, substance abuse, social issues (i.e homelessness).  Patient is poor historian.  She presents for "panic attack".  She states this happened last night after taking Trazadone which caused her to hallucinate and see a women walking with red stripes and flowers who looked and smiled at her and walked away. Of note she [facility] for suicidal ideation due to being kicked out of her daughters house in [place].  She states she is not longer suicidal as her niece in [place] offered she can stay with her.  It was stressful for her not to have a place to live.  When labs were checked in the ED K >7.5 with EKG changes so renal wanted patient to be admitted.  In the ED she mentioned she was having chest pain, abdominal pain, nausea/vomiting/diarrhea in the ED which resolved by the time of the exam.    Meds: Current Facility-Administered Medications  Medication Dose Route Frequency Provider Last Rate Last Dose  . 0.9 %  sodium chloride infusion  100 mL Intravenous PRN Delano Metzobert Schertz, MD      . 0.9 %  sodium chloride infusion  100 mL Intravenous PRN Delano Metzobert Schertz, MD      . alteplase (CATHFLO ACTIVASE) injection 2 mg  2 mg Intracatheter Once PRN Delano Metzobert Schertz, MD      . calcium gluconate inj 10% (1 g) URGENT USE ONLY!  1 g  Intravenous Once Glynn OctaveStephen Rancour, MD      . feeding supplement (NEPRO CARB STEADY) liquid 237 mL  237 mL Oral PRN Delano Metzobert Schertz, MD      . heparin injection 1,000 Units  1,000 Units Dialysis PRN Delano Metzobert Schertz, MD      . heparin injection 4,000 Units  4,000 Units Dialysis PRN Delano Metzobert Schertz, MD      . lidocaine (PF) (XYLOCAINE) 1 % injection 5 mL  5 mL Intradermal PRN Delano Metzobert Schertz, MD      . lidocaine-prilocaine (EMLA) cream 1 application  1 application Topical PRN Delano Metzobert Schertz, MD      . pentafluoroprop-tetrafluoroeth Peggye Pitt(GEBAUERS) aerosol 1 application  1 application Topical PRN Delano Metzobert Schertz, MD       Current Outpatient Prescriptions  Medication Sig Dispense Refill  . FLUoxetine (PROZAC) 20 MG capsule Take 20 mg by mouth daily.    . ISENTRESS 400 MG tablet Take 400 mg by mouth 2 (two) times daily.    Marland Kitchen. PROVENTIL HFA 108 (90 BASE) MCG/ACT inhaler Inhale 2 puffs into the lungs every 6 (six) hours as needed for wheezing or shortness of breath.     Sherrye Payor. SPIRIVA HANDIHALER 18 MCG inhalation capsule Place  18 mcg into inhaler and inhale daily.     . traZODone (DESYREL) 50 MG tablet Take 50 mg by mouth daily.      Allergies: Allergies as of 02/08/2015 - Review Complete 02/08/2015  Allergen Reaction Noted  . Morphine and related Itching 02/08/2015  . Tramadol Other (See Comments) 02/08/2015   Past Medical History  Diagnosis Date  . Hypertension   . Anxiety   . Renal disorder   . HIV (human immunodeficiency virus infection)   . Substance abuse    Past Surgical History  Procedure Laterality Date  . Av fistula placement    . Cesarean section     History reviewed. No pertinent family history. History   Social History  . Marital Status: Single    Spouse Name: N/A  . Number of Children: N/A  . Years of Education: N/A   Occupational History  . Not on file.   Social History Main Topics  . Smoking status: Current Every Day Smoker -- 0.50 packs/day for 40 years    Types: Cigarettes    . Smokeless tobacco: Never Used  . Alcohol Use: Yes     Comment: a couple times a month   . Drug Use: Yes    Special: Cocaine  . Sexual Activity: Not Currently   Other Topics Concern  . Not on file   Social History Narrative  . No narrative on file   5 kids Was homeless b/c kicked out of daughters house in [place] but niece in [place] agreed she come live with her  Smoker 4 cigs/day, denies EtOH or drugs currently   Review of Systems: General: denies fever, chills HEENT: denies h/a  Cardiac: denies chest pain, +GERD, +nausea, +vomiting bilious vomit Pulm: denies sob Abd: denies ab pain, denies diarrhea, +makes min. urine Ext: denies leg edema  Neuro: denies h/a    Physical Exam: Blood pressure 118/57, pulse 52, temperature 97.4 F (36.3 C), temperature source Oral, resp. rate 15, SpO2 96 %. Vitals reviewed. General: resting in bed in HD, NAD HEENT: [place]/at, no scleral icterus Cardiac: RRR, no rubs, murmurs or gallops, reproducible mid to left chest pain  Pulm: clear to auscultation bilaterally, no wheezes, rales, or rhonchi Abd: soft, nontender, nondistended, BS present Ext: warm and well perfused, no pedal edema Neuro: alert and oriented X3, cranial nerves II-XII grossly intact, moving all 4 extremities   Lab results: Basic Metabolic Panel:  Recent Labs  60/45/40 0626  NA 136  K >7.5*  CL 102  CO2 17*  GLUCOSE 133*  BUN 92*  CREATININE 12.31*  CALCIUM 6.2*   Liver Function Tests:  Recent Labs  02/08/15 0626  AST 26  ALT 19  ALKPHOS 415*  BILITOT 0.6  PROT 7.1  ALBUMIN 3.5    Recent Labs  02/08/15 0626  LIPASE 34   CBC:  Recent Labs  02/08/15 0626  WBC 8.1  NEUTROABS 5.7  HGB 11.8*  HCT 36.2  MCV 105.5*  PLT 263   Cardiac Enzymes:  Recent Labs  02/08/15 0626  TROPONINI <0.03   CBG:  Recent Labs  02/08/15 0857  GLUCAP 109*   Misc. Labs: Cd4, viral load HA1C ggt  Calcium ionized  B12, folate UDS  Imaging results:   Dg Chest 2 View  02/08/2015   CLINICAL DATA:  64 year old female with shortness of breath. abdominal pain, diarrhea, Initial encounter. Current history of dialysis.  EXAM: CHEST  2 VIEW  COMPARISON:  08/10/2014 and earlier [facility] PACS account  to be merged)  FINDINGS: Lower lung volumes. Chronic curvilinear scarring or atelectasis in the left mid lung. Mild atelectasis now along the right minor fissure. Stable cardiac and mediastinal contours allowing for lung volumes. No pneumothorax. No pleural effusion or consolidation. No pneumoperitoneum. Increased pulmonary vascularity without overt edema. 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  IMPRESSION: Lower lung volumes with atelectasis. Pulmonary vascular congestion without overt edema.   Electronically Signed   By: [name] M.D.   On: 02/08/2015 07:14    Other results: EKG: junctional rhythm, bradycardia upper 40s-50s, wnl axis, no ST changes, Diffuse peaked T waves and no p wave, long QRS, left atrial abnormality  Assessment & Plan by Problem: 64 y.o ESRD on HD MWF presents with hyperkalemia K>7.5 with EKG changes.    #Hyperkalemia with EKG changes  -K>7.5 with EKG changes (above) -will get HD today. Given Calcium gluconate 1 g, D50 x 2, insulin 10 units x 1, sodium bicarb x 1 by renal.  Pt wants to leave AMA after HD today.   -BMET 5 PM  -Pt wants to leave AMA after HD today though discussed not recommended   #Nausea/vomiting/diarrhea/abdominal pain resolved -?gastroenteritis. Elevated alkaline phosphatase ?cholestasis, bile duct obstruction.  Lipase and transamines wnl  -will check ggt,ordered US abdomen -if diarrhea noticed consider c diff for now will hold -prn phenergan   #atypical chest pain, resolved -trend trop x 2. 1st negative will r/o ACS. EKG w/o acute ST elevations. CXR with pulmonary vascular congestion w/o overt edema. Patient with history of anxiety and GERD which could be etiology of chest pain.  MSK also could be etiology of chest pain as reproducible  -trend trop x 2  -prn EKG for chest pain -started Prilosec 40 mg qd resume Prilosec 20 mg at discharge.   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 Arlean Hopping) and following.  CXR with pulmonary vascular congestion w/o overt edema  -will get HD today last HD was 02/05/15 in [place]. H/o noncompliance with HD  -check PTH, vitamin D, phos, ionized Ca-renal to address  -resumed meds per previous [facility] notes.  previous binders Sevelamer 800 tid with meals, Sensipar 120 mg qd   #Macrocytic anemia  -will check B12, folate   #History of anxiety/insomnia/substance abuse/suicidal ideation -continue prozac 20 mg, d/c Trazadone 50 mg qd as pt says caused visual hallucinations. Pt currently denies SI.  -pending UDS   #History of HIV  -continue Isentress bid  -check cd4 and viral load  -will need to establish with ID doctor at discharge   #H/o HTN -resumed meds per [facility] notes.  Imdur XL 30, Coreg 12.5 mg bid, Norvasc 5 mg (was 10 mg) per [facility] notes   #Hypocalcamia  -6.2. Will check ionized calcium as well  -renal please address   #Hyperglycemia/hypoglycemia -will check hemoglobin A1C may be off due to patient being HD patient  -monitor cbg q6 hours   #Elevated alkaline phosphatase -check ggt, abdominal US. ? Cholestasis, choledocholithasis, vs other etiology   #?h/o lung disease, h/o tobacco abuse  -patient is smoker but unknown if she ever had pfts. Denies asthma -will resume Spiriva, Albuterol prn  -may need outpatient pfts in the future   #F/E/N  -no IVF -BMET at 5 PM, renal function panel in am -renal diet   Of note  need to confirm all meds [facility] [place]. Confirmed with [facility] records.  Also pt will need to establish with PCP and ID.   Dispo:  Disposition is deferred at this time, awaiting improvement of current medical problems. Anticipated discharge in approximately 1-2 day(s). Pt threatening to leave AMA  The patient does not have a current PCP in [place].  She follows with renal and does need an [facility] hospital follow-up appointment after discharge with renal or another provider.   The patient does have transportation limitations that hinder transportation to clinic appointments.  Signed: Annett Gula, MD 02/08/2015, 9:56 AM

## 2015-02-08 NOTE — ED Notes (Addendum)
Pt c/o chest pain after returning from X-ray. Pt hooked back to EKG leads, second EKG captured and given to MD and MD notified of chest pain. Pt in no distress and sleeping at this time.

## 2015-02-08 NOTE — Procedures (Signed)
I was present at this dialysis session, have reviewed the session itself and made  appropriate changes  Rob Emiliano Welshans MD (pgr) [phone]    (c) [phone] 02/08/2015, 1:55 PM   

## 2015-02-08 NOTE — ED Notes (Signed)
Pt. Called EMS 2x last night for N/V. C/o stomach pain and diarrhea and anxiety. Dialysis pt due for dialysis today. NAD noted

## 2015-02-08 NOTE — ED Notes (Signed)
CBG of 109 prior to med administration.

## 2015-02-08 NOTE — Progress Notes (Signed)
Ms [name] was seen and examined during HD. She said that she had some anxiety but that she does not want to stay in the hospital. She noted that she recently had a prolonged [facility] and so she no longer wants to be in the hospital setting. She says she no longer has any suicidal ideation, nor any homicidal ideation. We explained that her potassium blood level was dangerously high and could be affecting the electrical conduction of her heart and if this is not treated, she could die. She said she is still planning to leave. We asked her where she would get HD as she does not have a chair and she did not have a clear answer. She was adamant that she will leave once she completes HD.  The patient has decided to leave against medical advice. The patient has a normal mental status, and understands the risks of leaving, including permanent disability and/or death, and has had an opportunity to ask questions about her medical condition. The patient has been informed that she may return for care at any time, and follow up has been arranged.  [facility] [place] to request a copy of her medication list for if she stays. If she does stay, we will continue with the plan as outlined in H&P  Farley LyAdam Finley Dinkel, MD Internal Medicine, PGY-1 Pager [phone] 02/08/2015, 1:33 PM

## 2015-02-08 NOTE — Consult Note (Addendum)
Renal Service Consult Note [facility]  [name] 02/08/2015 [name] Requesting Physician:  Dr Manus Gunning  Reason for Consult:  HyperK, ESRD HPI: The patient is a 64 y.o. year-old with hx of ESRD on HD x 7 years, HIV and HTN.  She was just released from [facility] according to her after spending about "30 days" there for suicidal tendencies.  She received HD there on MWF schedule as inpatient. She says she doesn't have an OP HD unit now and is planning to come to either [facility] or [facility] for HD as needed.  She was homeless but now is living with one of her home health nurses who took her in.  Most of the dialysis units in the region ([place], [place], [place], [place], [place], [place] more) are familiar with [name] and refuse to take her as a long-term patient due to pt's long hx of substance abuse, severe nonadherence and behavior issues.    She presented today with SOB and nausea, serum K is high at 7.5.  Called for HD. Patient denies severe SOB, CP, dizziness, abd pain at this time.   Past Medical History  Past Medical History  Diagnosis Date  . Hypertension   . Anxiety   . Renal disorder   . HIV (human immunodeficiency virus infection)   . Substance abuse    Past Surgical History  Past Surgical History  Procedure Laterality Date  . Av fistula placement    . Cesarean section     Family History History reviewed. No pertinent family history. Social History  reports that she has been smoking Cigarettes.  She has a 20 pack-year smoking history. She has never used smokeless tobacco. She reports that she drinks alcohol. She reports that she uses illicit drugs (Cocaine). Allergies  Allergies  Allergen Reactions  . Morphine And Related Itching  . Tramadol Other (See Comments)    hallucinations   Home medications Prior to Admission medications   Medication Sig Start Date End Date Taking? Authorizing Provider  FLUoxetine (PROZAC) 20 MG capsule Take 20 mg  by mouth daily. 02/07/15  Yes Historical Provider, MD  ISENTRESS 400 MG tablet Take 400 mg by mouth 2 (two) times daily. 12/19/14  Yes Historical Provider, MD  PROVENTIL HFA 108 (90 BASE) MCG/ACT inhaler Inhale 2 puffs into the lungs every 6 (six) hours as needed for wheezing or shortness of breath.  11/25/14  Yes Historical Provider, MD  SPIRIVA HANDIHALER 18 MCG inhalation capsule Place 18 mcg into inhaler and inhale daily.  11/25/14  Yes Historical Provider, MD  traZODone (DESYREL) 50 MG tablet Take 50 mg by mouth daily. 02/07/15  Yes Historical Provider, MD   Liver Function Tests  Recent Labs Lab 02/08/15 0626  AST 26  ALT 19  ALKPHOS 415*  BILITOT 0.6  PROT 7.1  ALBUMIN 3.5    Recent Labs Lab 02/08/15 0626  LIPASE 34   CBC  Recent Labs Lab 02/08/15 0626  WBC 8.1  NEUTROABS 5.7  HGB 11.8*  HCT 36.2  MCV 105.5*  PLT 263   Basic Metabolic Panel  Recent Labs Lab 02/08/15 0626  NA 136  K >7.5*  CL 102  CO2 17*  GLUCOSE 133*  BUN 92*  CREATININE 12.31*  CALCIUM 6.2*    Filed Vitals:   02/08/15 0600 02/08/15 0745 02/08/15 0815 02/08/15 0915  BP: 138/74 115/55 111/54 118/57  Pulse: 53 50  52  Temp: 97.4 F (36.3 C)     TempSrc: Oral  Resp: 19 18 21 15   SpO2: 100% 100%  96%   Exam Alert, no distress, chronically ill appearing No rash, cyanosis or gangrene Sclera anicteric, throat clear No jvd Chest clear bilat RRR no MRG Abd soft, NTND, no mass or ascites No LE edema Neuro is alert, no asterixis  HD: old records from nov '15 > 4 hr 109kgs  [id number]  LFA AVF   Assessment: 1. Hyperkalemia / junctional bradycardia - BP's ok 2. Poor social situation 3. Hx nonadherence and substance abuse 4. ESRD on HD 7 yrs 5. HIV 6. HTN/ vol - no gross vol excess   Plan- plan acute HD asap for hyperkalemia, modest UF  Vinson Moselleob Jean Azua MD (pgr) [phone]    [phone] 02/08/2015, 9:30 AM

## 2015-02-08 NOTE — Discharge Summary (Signed)
Patient Left AMA   Name: [name] MRN: [id number] DOB: 1951-04-14 64 y.o. PCP: No primary care provider on file.  Date of Admission: 02/08/2015  5:35 AM Date of leaving AMA: 02/08/2015 Attending Physician: Inez Catalina, MD   Diagnosis:  Principal Problem:   Hyperkalemia Active Problems:   Chest pain   Nausea and vomiting   Diarrhea   Abdominal pain   ESRD on dialysis   Macrocytic anemia   History of anxiety   Abnormal EKG   Secondary hyperparathyroidism   History of substance abuse   History of insomnia  Medications:   Medication List    ASK your doctor about these medications        FLUoxetine 20 MG capsule  Commonly known as:  PROZAC  Take 20 mg by mouth daily.     ISENTRESS 400 MG tablet  Generic drug:  raltegravir  Take 400 mg by mouth 2 (two) times daily.     PROVENTIL HFA 108 (90 BASE) MCG/ACT inhaler  Generic drug:  albuterol  Inhale 2 puffs into the lungs every 6 (six) hours as needed for wheezing or shortness of breath.     SPIRIVA HANDIHALER 18 MCG inhalation capsule  Generic drug:  tiotropium  Place 18 mcg into inhaler and inhale daily.     traZODone 50 MG tablet  Commonly known as:  DESYREL  Take 50 mg by mouth daily.        Disposition and follow-up:   [name] was left AMA from [facility] in Serious condition.  At the hospital follow up visit please address:  1.  Medication compliance, HD compliance and placement, PCP compliance  2.  Labs / imaging needed at time of follow-up: CMP, HIV RNA, CD4 count, GGT, abdominal ultrasound  3.  Pending labs/ test needing follow-up: none   Consultations: Treatment Team:  Delano Metz, MD  Procedures Performed:  Dg Chest 2 View  02/08/2015   CLINICAL DATA:  64 year old female with shortness of breath. abdominal pain, diarrhea, Initial encounter. Current history of dialysis.  EXAM: CHEST  2 VIEW  COMPARISON:  08/10/2014 and earlier (duplicate [facility] PACS account  to be merged)  FINDINGS: Lower lung volumes. Chronic curvilinear scarring or atelectasis in the left mid lung. Mild atelectasis now along the right minor fissure. Stable cardiac and mediastinal contours allowing for lung volumes. No pneumothorax. No pleural effusion or consolidation. No pneumoperitoneum. Increased pulmonary vascularity without overt edema. 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  IMPRESSION: Lower lung volumes with atelectasis. Pulmonary vascular congestion without overt edema.   Electronically Signed   By: [name] M.D.   On: 02/08/2015 07:14   Admission HPI: 64 y.o pmh HIV x 18 years (not sure how contracted), ESRD on HD MWF (fired from several HD centers; on HD x 7 years), anxiety, HTN, substance abuse, social issues (i.e homelessness). Patient is poor historian. She presents for "panic attack". She states this happened last night after taking Trazadone which caused her to hallucinate and see a women walking with red stripes and flowers who looked and smiled at her and walked away. Of note she was recently in [facility] from 3/27-4/29 for suicidal ideation due to being kicked out of her daughters house in [place]. She states she is not longer suicidal as her niece in [place] offered she can stay with her. It was stressful for her not to have a place to live. When labs were checked in the ED K >7.5 with  EKG changes so renal wanted patient to be admitted. In the ED she mentioned she was having chest pain, abdominal pain, nausea/vomiting/diarrhea in the ED which resolved by the time of the exam.   Hospital Course by problem list: Principal Problem:   Hyperkalemia Active Problems:   Chest pain   Nausea and vomiting   Diarrhea   Abdominal pain   ESRD on dialysis   Macrocytic anemia   History of anxiety   Abnormal EKG   Secondary hyperparathyroidism   History of substance abuse   History of insomnia   #Hyperkalemia with EKG changes  -K>7.5 with EKG changes.  She was given Calcium gluconate 1 g, D50 x 2, insulin 10 units x 1, sodium bicarb x 1 by renal. She then got HD which should have resolve the hyperkalemia but she left AMA before a BMP could recheck her potassium level.  #Nausea/vomiting/diarrhea/abdominal pain resolved This could be gastroenteritis. She had an elevated alkaline phosphatase so she may have cholestasis, bile duct obstruction.Her lipase and transamines were within normal limits. We planned to check GGT and get abdominal ultrasound to initiate work-up but she left AMA before further work-up could be completed.  #Atypical chest pain, resolved Troponin was <0.03 and EKG without acute ischemic changes. The pain had resolved once she was seen by primary team. This could have been due to her known anxiety or GERD. Musculoskeletal also considered as it was reproducible. She was supposed to have two more troponins trended but she left AMA. She was also supposed to start prilosec 40 mg daily and continue home dose aspirin 81 mg qd, Imdur XL 30, Coreg 12.5 mg bid, Norvasc 5 mg (was 10 mg) per [facility] notes   #ESRD on HD MWF with secondary hyperparathyroidism Renal consulted in the ED (Dr. Arlean HoppingSchertz) due to her hyperkalemia as well as CXR with pulmonary vascular congestion w/o overt edema. Her [place]05/[place]. She has an extensive history of non-compliance and has been fired from several HD practices. She got HD today and left AMA right after. There was a plan to check PTH, vitamin D, phos, ionized Ca with renal to address. We planned to resume meds per previous [facility] notes. Prior to leaving AMA, she said she had no plans of where she would get future HD.  #Macrocytic anemia  Presenting hemoglobin 11.8 with MCV 106. We were planning to check B12, folate but she left AMA.  #History of anxiety/insomnia/substance abuse/suicidal ideation We had planned to continue prozac 20 mg but stop trazadone 50 mg qd as pt says caused visual  hallucinations. She denied any suicidal or homicidal ideations.  #History of HIV  [name] does not appear to have established with ID physician. We planned to continue Isentress bid and check CD4 and viral load before she left AMA. [facility] viread 300 mg every Friday, isentress 400 mg bidwc, and lamivudine 150 mg daily but unclear if she is actually taking these.  #H/o HTN The plan was to resume meds per [facility] notes which include Imdur XL 30, Coreg 12.5 mg bid, Norvasc 5 mg (was 10 mg) per [facility] notes.  #Hypocalcamia  Calcium 6.2 on admission. We were planning to check ionized calcium and ask renal for recommendations.  #Hyperglycemia/hypoglycemia The plan was to check hemoglobin A1C which may be inaccurate due to patient being HD patient and monitor CBG every six hours.  Final Vitals:   BP 166/99 mmHg  Pulse 96  Temp(Src)  97.5 F (36.4 C) (Oral)  Resp 18  Wt 248 lb 3.8 oz (112.6 kg)  SpO2 100%  Labs:  Basic Metabolic Panel:  Recent Labs  16/10/96 0626  NA 136  K >7.5*  CL 102  CO2 17*  GLUCOSE 133*  BUN 92*  CREATININE 12.31*  CALCIUM 6.2*   Liver Function Tests:  Recent Labs  02/08/15 0626  AST 26  ALT 19  ALKPHOS 415*  BILITOT 0.6  PROT 7.1  ALBUMIN 3.5    Recent Labs  02/08/15 0626  LIPASE 34   CBC:  Recent Labs  02/08/15 0626  WBC 8.1  NEUTROABS 5.7  HGB 11.8*  HCT 36.2  MCV 105.5*  PLT 263   Cardiac Enzymes:  Recent Labs  02/08/15 0626  TROPONINI <0.03   CBG:  Recent Labs  02/08/15 0857 02/08/15 1001  GLUCAP 109* 60*    Signed: Farley Ly, MD 02/09/15, 1:08 pm   Services Ordered on AMA leave: none Equipment Ordered on AMA leave: none

## 2015-02-08 NOTE — ED Notes (Signed)
Per Enrique SackKendra in pharmacy, second IV must be established in order to give calcium gluconate IV push.

## 2015-02-08 NOTE — Progress Notes (Signed)
At the end of treatment, Vital signs stable, no SOB observed , No pain stated, stood to scale for weight Patient requested to be released from hospital instead of transporting to her room, was adamant, signed AMA to leave hospital, notified MD and also floor nurse on [address] that patient was not coming to floor, was transported safely via wheelchair back to the ER waiting area.

## 2015-02-08 NOTE — ED Provider Notes (Signed)
CSN: [id number]     Arrival date & time 02/08/15  0535 History   First MD Initiated Contact with Patient 02/08/15 0601     Chief Complaint  Patient presents with  . Nausea  . Emesis  . Anxiety     (Consider location/radiation/quality/duration/timing/severity/associated sxs/prior Treatment) HPI Comments: Patient is a 64 year old female past medical history significant for hypertension, anxiety, HIV, end-stage renal disease on dialysis Monday Wednesday Friday presenting to the emergency department for acute onset nausea, vomiting, diarrhea with epigastric abdominal pain. Patient also states she's had increased anxiety since last evening as well. She states she took a tramadol and had hallucinations, states she was seeing people in the room that were not there. Patient states she went to dialysis on Friday and had to stop 2 L short due to cramping. She is due for dialysis today. She states she feels "fluid overloaded." She endorses some shortness of breath, treated it dry heaving and emesis.   Past Medical History  Diagnosis Date  . Hypertension   . Anxiety   . Renal disorder   . HIV (human immunodeficiency virus infection)   . Substance abuse    Past Surgical History  Procedure Laterality Date  . Av fistula placement    . Cesarean section     History reviewed. No pertinent family history. History  Substance Use Topics  . Smoking status: Current Every Day Smoker -- 0.50 packs/day for 40 years    Types: Cigarettes  . Smokeless tobacco: Never Used  . Alcohol Use: Yes     Comment: a couple times a month    OB History    No data available     Review of Systems  Respiratory: Positive for shortness of breath.   Cardiovascular: Negative for chest pain.  Gastrointestinal: Positive for nausea, vomiting, abdominal pain and diarrhea.  All other systems reviewed and are negative.     Allergies  Morphine and related and Tramadol  Home Medications   Prior to Admission medications    Medication Sig Start Date End Date Taking? Authorizing Provider  FLUoxetine (PROZAC) 20 MG capsule Take 20 mg by mouth daily. 02/07/15  Yes Historical Provider, MD  ISENTRESS 400 MG tablet Take 400 mg by mouth 2 (two) times daily. 12/19/14  Yes Historical Provider, MD  PROVENTIL HFA 108 (90 BASE) MCG/ACT inhaler Inhale 2 puffs into the lungs every 6 (six) hours as needed for wheezing or shortness of breath.  11/25/14  Yes Historical Provider, MD  SPIRIVA HANDIHALER 18 MCG inhalation capsule Place 18 mcg into inhaler and inhale daily.  11/25/14  Yes Historical Provider, MD  traZODone (DESYREL) 50 MG tablet Take 50 mg by mouth daily. 02/07/15  Yes Historical Provider, MD   BP 118/57 mmHg  Pulse 52  Temp(Src) 97.4 F (36.3 C) (Oral)  Resp 15  SpO2 96% Physical Exam  Constitutional: She is oriented to person, place, and time. She appears well-developed and well-nourished. No distress.  HENT:  Head: Normocephalic and atraumatic.  Right Ear: External ear normal.  Left Ear: External ear normal.  Nose: Nose normal.  Mouth/Throat: Oropharynx is clear and moist.  Eyes: Conjunctivae are normal.  Neck: Normal range of motion. Neck supple.  No nuchal rigidity.   Cardiovascular: Normal rate, regular rhythm and normal heart sounds.   Pulmonary/Chest: Effort normal.  Abdominal: Soft. She exhibits no distension. There is tenderness (generalized). There is no rebound and no guarding.  Musculoskeletal: Normal range of motion.  Neurological: She is alert and oriented  to person, place, and time.  Skin: Skin is warm and dry. She is not diaphoretic.     Psychiatric: She has a normal mood and affect.  Nursing note and vitals reviewed.   ED Course  Procedures (including critical care time) Medications  calcium gluconate inj 10% (1 g) URGENT USE ONLY! (1 g Intravenous Not Given 02/08/15 1002)  pentafluoroprop-tetrafluoroeth (GEBAUERS) aerosol 1 application (not administered)  lidocaine (PF) (XYLOCAINE) 1 %  injection 5 mL (not administered)  lidocaine-prilocaine (EMLA) cream 1 application (not administered)  0.9 %  sodium chloride infusion (not administered)  0.9 %  sodium chloride infusion (not administered)  feeding supplement (NEPRO CARB STEADY) liquid 237 mL (not administered)  heparin injection 1,000 Units (not administered)  alteplase (CATHFLO ACTIVASE) injection 2 mg (not administered)  heparin injection 4,000 Units (not administered)  dextrose 50 % solution 50 mL (not administered)  promethazine (PHENERGAN) injection 25 mg (25 mg Intravenous Given 02/08/15 0644)  sodium bicarbonate injection 50 mEq (50 mEq Intravenous Given 02/08/15 0904)  dextrose 50 % solution 50 mL (50 mLs Intravenous Given 02/08/15 0859)  insulin aspart (novoLOG) injection 10 Units (10 Units Intravenous Given 02/08/15 0900)    Labs Review Labs Reviewed  CBC WITH DIFFERENTIAL/PLATELET - Abnormal; Notable for the following:    RBC 3.43 (*)    Hemoglobin 11.8 (*)    MCV 105.5 (*)    MCH 34.4 (*)    All other components within normal limits  COMPREHENSIVE METABOLIC PANEL - Abnormal; Notable for the following:    Potassium >7.5 (*)    CO2 17 (*)    Glucose, Bld 133 (*)    BUN 92 (*)    Creatinine, Ser 12.31 (*)    Calcium 6.2 (*)    Alkaline Phosphatase 415 (*)    GFR calc non Af Amer 3 (*)    GFR calc Af Amer 3 (*)    Anion gap 17 (*)    All other components within normal limits  CBG MONITORING, ED - Abnormal; Notable for the following:    Glucose-Capillary 109 (*)    All other components within normal limits  CBG MONITORING, ED - Abnormal; Notable for the following:    Glucose-Capillary 60 (*)    All other components within normal limits  LIPASE, BLOOD  TROPONIN I  RENAL FUNCTION PANEL  CBC    Imaging Review Dg Chest 2 View  02/08/2015   CLINICAL DATA:  64 year old female with shortness of breath. abdominal pain, diarrhea, Initial encounter. Current history of dialysis.  EXAM: CHEST  2 VIEW  COMPARISON:   08/10/2014 and earlier (duplicate [facility] PACS account to be merged)  FINDINGS: Lower lung volumes. Chronic curvilinear scarring or atelectasis in the left mid lung. Mild atelectasis now along the right minor fissure. Stable cardiac and mediastinal contours allowing for lung volumes. No pneumothorax. No pleural effusion or consolidation. No pneumoperitoneum. Increased pulmonary vascularity without overt edema. 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  IMPRESSION: Lower lung volumes with atelectasis. Pulmonary vascular congestion without overt edema.   Electronically Signed   By: [name] M.D.   On: 02/08/2015 07:14     EKG Interpretation   Date/Time:  Monday Feb 08 2015 07:43:19 EDT Ventricular Rate:  49 PR Interval:    QRS Duration: 142 QT Interval:  534 QTC Calculation: 482 R Axis:   29 Text Interpretation:  Junctional rhythm Nonspecific intraventricular  conduction delay No significant change was found Confirmed by Manus Gunning  MD,  STEPHEN [phone])  on 02/08/2015 7:57:11 AM      9:35 AM Patient refusing second IV placement for calcium gluconate. Discussed the necessity for the calcium gluconate as it is cardioprotective patient is adamant about not receiving a second IV and demanding to see the physician at this time.  CRITICAL CARE Performed by: Francee Piccolo L   Total critical care time: 45 min  Critical care time was exclusive of separately billable procedures and treating other patients.  Critical care was necessary to treat or prevent imminent or life-threatening deterioration.  Critical care was time spent personally by me on the following activities: development of treatment plan with patient and/or surrogate as well as nursing, discussions with consultants, evaluation of patient's response to treatment, examination of patient, obtaining history from patient or surrogate, ordering and performing treatments and interventions, ordering and review of laboratory  studies, ordering and review of radiographic studies, pulse oximetry and re-evaluation of patient's condition.  MDM   Final diagnoses:  Shortness of breath  Hyperkalemia  ESRD (end stage renal disease) on dialysis    Filed Vitals:   02/08/15 0915  BP: 118/57  Pulse: 52  Temp:   Resp: 15   Afebrile, AAOx4.   Patient presenting with acute onset nausea, vomiting, abdominal pain, diarrhea. Abdomen soft, generalized tenderness, no peritoneal signs. Diminished breath sounds at bases. AV fistula with good thrill. EKG reviewed. Potassium > 7.5. Nephrology consulted, will take patient to dialysis and request admission to medicine. Insulin, D50, Calcium gluconate ordered. IM service will admit the patient. Patient d/w with Dr. Manus Gunning, agrees with plan.       Francee Piccolo, PA-C 02/08/15 1018  Glynn Octave, MD 02/08/15 1745

## 2015-02-10 ENCOUNTER — Encounter (HOSPITAL_COMMUNITY): Payer: Self-pay

## 2015-02-10 ENCOUNTER — Emergency Department (HOSPITAL_COMMUNITY)
Admission: EM | Admit: 2015-02-10 | Discharge: 2015-02-10 | Disposition: A | Discharge status: Home / Self Care | Payer: Medicare Other | Attending: Emergency Medicine | Admitting: Emergency Medicine

## 2015-02-10 DIAGNOSIS — Z79899 Other long term (current) drug therapy: Secondary | ICD-10-CM | POA: Insufficient documentation

## 2015-02-10 DIAGNOSIS — F419 Anxiety disorder, unspecified: Secondary | ICD-10-CM | POA: Diagnosis not present

## 2015-02-10 DIAGNOSIS — N186 End stage renal disease: Secondary | ICD-10-CM | POA: Insufficient documentation

## 2015-02-10 DIAGNOSIS — Z21 Asymptomatic human immunodeficiency virus [HIV] infection status: Secondary | ICD-10-CM | POA: Diagnosis not present

## 2015-02-10 DIAGNOSIS — Z720 Tobacco use: Secondary | ICD-10-CM | POA: Diagnosis not present

## 2015-02-10 DIAGNOSIS — Z992 Dependence on renal dialysis: Secondary | ICD-10-CM | POA: Diagnosis not present

## 2015-02-10 DIAGNOSIS — I120 Hypertensive chronic kidney disease with stage 5 chronic kidney disease or end stage renal disease: Secondary | ICD-10-CM | POA: Insufficient documentation

## 2015-02-10 LAB — BASIC METABOLIC PANEL
ANION GAP: 18 — AB (ref 5–15)
BUN: 52 mg/dL — ABNORMAL HIGH (ref 6–20)
CO2: 22 mmol/L (ref 22–32)
Calcium: 6.5 mg/dL — ABNORMAL LOW (ref 8.9–10.3)
Chloride: 98 mmol/L — ABNORMAL LOW (ref 101–111)
Creatinine, Ser: 10.2 mg/dL — ABNORMAL HIGH (ref 0.44–1.00)
GFR calc Af Amer: 4 mL/min — ABNORMAL LOW (ref >60)
GFR calc non Af Amer: 4 mL/min — ABNORMAL LOW (ref >60)
GLUCOSE: 134 mg/dL — AB (ref 70–99)
Potassium: 5.3 mmol/L — ABNORMAL HIGH (ref 3.5–5.1)
Sodium: 138 mmol/L (ref 135–145)

## 2015-02-10 LAB — CBC WITH DIFFERENTIAL/PLATELET
Basophils Absolute: 0 K/uL (ref 0.0–0.1)
Basophils Relative: 0 % (ref 0–1)
Eosinophils Absolute: 0.1 K/uL (ref 0.0–0.7)
Eosinophils Relative: 1 % (ref 0–5)
HCT: 35.8 % — ABNORMAL LOW (ref 36.0–46.0)
Hemoglobin: 11.7 g/dL — ABNORMAL LOW (ref 12.0–15.0)
Lymphocytes Relative: 35 % (ref 12–46)
Lymphs Abs: 2.7 K/uL (ref 0.7–4.0)
MCH: 34.9 pg — ABNORMAL HIGH (ref 26.0–34.0)
MCHC: 32.7 g/dL (ref 30.0–36.0)
MCV: 106.9 fL — ABNORMAL HIGH (ref 78.0–100.0)
Monocytes Absolute: 0.7 K/uL (ref 0.1–1.0)
Monocytes Relative: 10 % (ref 3–12)
Neutro Abs: 4.1 K/uL (ref 1.7–7.7)
Neutrophils Relative %: 54 % (ref 43–77)
Platelets: 243 K/uL (ref 150–400)
RBC: 3.35 MIL/uL — ABNORMAL LOW (ref 3.87–5.11)
RDW: 13.4 % (ref 11.5–15.5)
WBC: 7.6 K/uL (ref 4.0–10.5)

## 2015-02-10 MED ORDER — ACETAMINOPHEN 325 MG PO TABS
650.0000 mg | ORAL_TABLET | Freq: Once | ORAL | Status: AC
  Administered 2015-02-10: 650 mg via ORAL

## 2015-02-10 MED ORDER — ACETAMINOPHEN 500 MG PO TABS
500.0000 mg | ORAL_TABLET | Freq: Once | ORAL | Status: AC
  Administered 2015-02-10: 500 mg via ORAL
  Filled 2015-02-10: qty 1

## 2015-02-10 MED ORDER — DIPHENHYDRAMINE HCL 25 MG PO CAPS
ORAL_CAPSULE | ORAL | Status: AC
  Filled 2015-02-10: qty 1

## 2015-02-10 MED ORDER — ACETAMINOPHEN 325 MG PO TABS
ORAL_TABLET | ORAL | Status: AC
  Filled 2015-02-10: qty 2

## 2015-02-10 MED ORDER — DIPHENHYDRAMINE HCL 25 MG PO CAPS
25.0000 mg | ORAL_CAPSULE | Freq: Once | ORAL | Status: AC
  Administered 2015-02-10: 25 mg via ORAL

## 2015-02-10 NOTE — Progress Notes (Addendum)
Spoke to ClantonJudy in HD r/t CLIP process for pt.  Darel HongJudy states pt has been denied and fired from several HD centers in this area. Pt will have to return [place] [place] or ER for HD.

## 2015-02-10 NOTE — ED Notes (Signed)
Lab at the bedside 

## 2015-02-10 NOTE — Progress Notes (Signed)
Patient has terminated treatment early due to gout pain. Patient was urged to complete her treatment as ordered, but patient adamantly refused. Patient is stable and has no verbalized concerns at this time. Patient states that she will take her blood pressure and gout medication once she is home. Patient is discharged at this time and will return for her scheduled treatment on 02/13/2015.

## 2015-02-10 NOTE — ED Notes (Signed)
[place] [facility] [place] and hasn't been able to get set up for her dialysis here and needs help finding somewhere to go. Pt came here Monday and got her dialysis. Normally does it on M, W, F. No complaints
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
## 2015-02-12 ENCOUNTER — Encounter (HOSPITAL_COMMUNITY): Payer: Self-pay | Admitting: Emergency Medicine

## 2015-02-12 ENCOUNTER — Emergency Department (HOSPITAL_COMMUNITY)
Admission: EM | Admit: 2015-02-12 | Discharge: 2015-02-12 | Disposition: A | Discharge status: Home / Self Care | Payer: Medicare Other | Attending: Emergency Medicine | Admitting: Emergency Medicine

## 2015-02-12 DIAGNOSIS — I120 Hypertensive chronic kidney disease with stage 5 chronic kidney disease or end stage renal disease: Secondary | ICD-10-CM | POA: Insufficient documentation

## 2015-02-12 DIAGNOSIS — Z992 Dependence on renal dialysis: Secondary | ICD-10-CM

## 2015-02-12 DIAGNOSIS — E875 Hyperkalemia: Secondary | ICD-10-CM | POA: Diagnosis not present

## 2015-02-12 DIAGNOSIS — Z720 Tobacco use: Secondary | ICD-10-CM | POA: Insufficient documentation

## 2015-02-12 DIAGNOSIS — B20 Human immunodeficiency virus [HIV] disease: Secondary | ICD-10-CM | POA: Insufficient documentation

## 2015-02-12 DIAGNOSIS — F419 Anxiety disorder, unspecified: Secondary | ICD-10-CM | POA: Diagnosis not present

## 2015-02-12 DIAGNOSIS — N186 End stage renal disease: Secondary | ICD-10-CM | POA: Insufficient documentation

## 2015-02-12 DIAGNOSIS — Z79899 Other long term (current) drug therapy: Secondary | ICD-10-CM | POA: Insufficient documentation

## 2015-02-12 DIAGNOSIS — E669 Obesity, unspecified: Secondary | ICD-10-CM | POA: Insufficient documentation

## 2015-02-12 DIAGNOSIS — Z4931 Encounter for adequacy testing for hemodialysis: Secondary | ICD-10-CM | POA: Diagnosis present

## 2015-02-12 LAB — CBC WITH DIFFERENTIAL/PLATELET
BASOS PCT: 0 % (ref 0–1)
Basophils Absolute: 0 10*3/uL (ref 0.0–0.1)
EOS PCT: 1 % (ref 0–5)
Eosinophils Absolute: 0.1 10*3/uL (ref 0.0–0.7)
HCT: 35.5 % — ABNORMAL LOW (ref 36.0–46.0)
Hemoglobin: 11.6 g/dL — ABNORMAL LOW (ref 12.0–15.0)
LYMPHS ABS: 2.5 10*3/uL (ref 0.7–4.0)
Lymphocytes Relative: 35 % (ref 12–46)
MCH: 35.4 pg — AB (ref 26.0–34.0)
MCHC: 32.7 g/dL (ref 30.0–36.0)
MCV: 108.2 fL — AB (ref 78.0–100.0)
Monocytes Absolute: 0.5 10*3/uL (ref 0.1–1.0)
Monocytes Relative: 7 % (ref 3–12)
NEUTROS ABS: 3.9 10*3/uL (ref 1.7–7.7)
Neutrophils Relative %: 57 % (ref 43–77)
Platelets: 241 10*3/uL (ref 150–400)
RBC: 3.28 MIL/uL — ABNORMAL LOW (ref 3.87–5.11)
RDW: 13.5 % (ref 11.5–15.5)
WBC: 7 10*3/uL (ref 4.0–10.5)

## 2015-02-12 LAB — BASIC METABOLIC PANEL
ANION GAP: 15 (ref 5–15)
BUN: 51 mg/dL — ABNORMAL HIGH (ref 6–20)
CALCIUM: 6.6 mg/dL — AB (ref 8.9–10.3)
CO2: 21 mmol/L — AB (ref 22–32)
Chloride: 105 mmol/L (ref 101–111)
Creatinine, Ser: 10.71 mg/dL — ABNORMAL HIGH (ref 0.44–1.00)
GFR calc Af Amer: 4 mL/min — ABNORMAL LOW (ref >60)
GFR calc non Af Amer: 3 mL/min — ABNORMAL LOW (ref >60)
Glucose, Bld: 97 mg/dL (ref 70–99)
Potassium: 6.2 mmol/L (ref 3.5–5.1)
SODIUM: 141 mmol/L (ref 135–145)

## 2015-02-12 LAB — HEPATITIS B SURFACE ANTIGEN: HEP B S AG: NEGATIVE

## 2015-02-12 LAB — POTASSIUM: POTASSIUM: 3.4 mmol/L — AB (ref 3.5–5.1)

## 2015-02-12 MED ORDER — DIPHENHYDRAMINE HCL 50 MG/ML IJ SOLN
INTRAMUSCULAR | Status: AC
  Administered 2015-02-12: 25 mg via INTRAVENOUS
  Filled 2015-02-12: qty 1

## 2015-02-12 MED ORDER — DIPHENHYDRAMINE HCL 50 MG/ML IJ SOLN
25.0000 mg | Freq: Once | INTRAMUSCULAR | Status: AC
  Administered 2015-02-12: 25 mg via INTRAVENOUS

## 2015-02-12 MED ORDER — ACETAMINOPHEN 325 MG PO TABS
ORAL_TABLET | ORAL | Status: AC
  Administered 2015-02-12: 650 mg via ORAL
  Filled 2015-02-12: qty 2

## 2015-02-12 NOTE — Progress Notes (Signed)
Pt discharged home. Denies pain, SOB, dizziness or n/v. Pt accompanied to the ED to get picked by a family member. Waited until the family member came to pick her up.

## 2015-02-12 NOTE — ED Notes (Signed)
Pt reports to ED due to needing dialysis. Pt on M/W/F schedule. Last dialysis this passed Wednesday. Pt states she is trying to find a dialysis facility around area since she just moved here but has been unsuccessful. Pt denies any complaints. AO x4. VSS.

## 2015-02-12 NOTE — ED Notes (Signed)
Critical Potassium 6.2, PA Cartner aware.
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
## 2015-02-12 NOTE — Procedures (Signed)
I was present at this dialysis session, have reviewed the session itself and made  appropriate changes  Patient in no distress, labs reviewed.  Here for dialysis, she has no OP HD. K up at 6.2. Hb 11.6. See HD orders  Vinson Moselleob Peyton Rossner MD (pgr) [phone]    [phone] 02/12/2015, 5:45 PM

## 2015-02-12 NOTE — ED Notes (Signed)
Pt suddenly complaining of throbbing pain in left lower chest. Also states was nauseated this morning.

## 2015-02-15 ENCOUNTER — Encounter (HOSPITAL_COMMUNITY): Payer: Self-pay | Admitting: Emergency Medicine

## 2015-02-15 ENCOUNTER — Emergency Department (HOSPITAL_COMMUNITY)
Admission: EM | Admit: 2015-02-15 | Discharge: 2015-02-15 | Disposition: A | Discharge status: Other Acute Inpatient Hospital | Payer: Medicare Other | Attending: Emergency Medicine | Admitting: Emergency Medicine

## 2015-02-15 DIAGNOSIS — F329 Major depressive disorder, single episode, unspecified: Secondary | ICD-10-CM | POA: Diagnosis not present

## 2015-02-15 DIAGNOSIS — M1990 Unspecified osteoarthritis, unspecified site: Secondary | ICD-10-CM | POA: Diagnosis not present

## 2015-02-15 DIAGNOSIS — Z862 Personal history of diseases of the blood and blood-forming organs and certain disorders involving the immune mechanism: Secondary | ICD-10-CM | POA: Insufficient documentation

## 2015-02-15 DIAGNOSIS — Z8701 Personal history of pneumonia (recurrent): Secondary | ICD-10-CM | POA: Diagnosis not present

## 2015-02-15 DIAGNOSIS — N186 End stage renal disease: Secondary | ICD-10-CM | POA: Diagnosis not present

## 2015-02-15 DIAGNOSIS — Z4931 Encounter for adequacy testing for hemodialysis: Secondary | ICD-10-CM | POA: Diagnosis not present

## 2015-02-15 DIAGNOSIS — Z79899 Other long term (current) drug therapy: Secondary | ICD-10-CM | POA: Diagnosis not present

## 2015-02-15 DIAGNOSIS — K219 Gastro-esophageal reflux disease without esophagitis: Secondary | ICD-10-CM | POA: Diagnosis not present

## 2015-02-15 DIAGNOSIS — Z21 Asymptomatic human immunodeficiency virus [HIV] infection status: Secondary | ICD-10-CM | POA: Diagnosis not present

## 2015-02-15 DIAGNOSIS — F419 Anxiety disorder, unspecified: Secondary | ICD-10-CM | POA: Insufficient documentation

## 2015-02-15 DIAGNOSIS — Z992 Dependence on renal dialysis: Secondary | ICD-10-CM | POA: Insufficient documentation

## 2015-02-15 DIAGNOSIS — I120 Hypertensive chronic kidney disease with stage 5 chronic kidney disease or end stage renal disease: Secondary | ICD-10-CM | POA: Diagnosis not present

## 2015-02-15 DIAGNOSIS — Z720 Tobacco use: Secondary | ICD-10-CM | POA: Diagnosis not present

## 2015-02-15 DIAGNOSIS — I509 Heart failure, unspecified: Secondary | ICD-10-CM | POA: Diagnosis not present

## 2015-02-15 LAB — I-STAT CHEM 8, ED
BUN: 62 mg/dL — ABNORMAL HIGH (ref 6–20)
Calcium, Ion: 0.84 mmol/L — ABNORMAL LOW (ref 1.13–1.30)
Chloride: 103 mmol/L (ref 101–111)
Creatinine, Ser: 11.4 mg/dL — ABNORMAL HIGH (ref 0.44–1.00)
Glucose, Bld: 134 mg/dL — ABNORMAL HIGH (ref 70–99)
HCT: 41 % (ref 36.0–46.0)
Hemoglobin: 13.9 g/dL (ref 12.0–15.0)
Potassium: 4.8 mmol/L (ref 3.5–5.1)
Sodium: 138 mmol/L (ref 135–145)
TCO2: 19 mmol/L (ref 0–100)
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
## 2015-02-15 NOTE — Progress Notes (Signed)
Subjective:   Presented for HD. Reports her family came to visit yesterday and she over did it with fluid. She is asking for help finding transportation and a place to live. She doesn't want to go back to live with her family because she thinks they are all doing drugs and she doesn't want that life anymore. She reports she had suicidal thoughts a few months ago and was [facility]. Denies current suicidal thoughts or thoughts of self harm but does admit to being depressed. She understands to call 911 for any thoughts of self harm but she states she does not forsee that being an issue. She says she is discouraged about not being able to find an outpt center but will continue to come to HD in the ED for now. Denies sob/chest pain  Objective Filed Vitals:   02/15/15 1342 02/15/15 1620 02/15/15 1626  BP: 173/92  129/97  Pulse: 97  97  Temp: 98.2 F (36.8 C) 98.1 F (36.7 C)   TempSrc: Oral Oral   Resp: 24  22  Height: 5\' 4"  (1.626 m)    Weight: 115.214 kg (254 lb)    SpO2: 98%  99%   Physical Exam General: alert and oriented, calm, no acute distress.  Heart:RRR Lungs: CTA, unlabored  Abdomen: soft, nontender  Extremities: trace LE edema Dialysis Access:  L AVF +b.t  Assessment/Plan: HD today - continue HD MWF through the ED K+ 4.8 Check CBC Consult SW for assist  Jetty DuhamelBridget Whelan, NP [facility] [phone] 02/15/2015,4:35 PM   Pt seen, examined and agree w A/P as above.  Vinson Moselleob Kenedi Cilia MD pager [phone]    cell [phone] 02/16/2015, 9:41 AM     Additional Objective Labs: Basic Metabolic Panel:  Recent Labs Lab 02/15/15 1558  NA 138  K 4.8  CL 103  GLUCOSE 134*  BUN 62*  CREATININE 11.40*   Liver Function Tests: No results for input(s): AST, ALT, ALKPHOS, BILITOT, PROT, ALBUMIN in the last 168 hours. No results for input(s): LIPASE, AMYLASE in the last 168 hours. CBC:  Recent Labs Lab 02/15/15 1558  HGB 13.9  HCT 41.0   Blood  Culture No results found for: SDES, SPECREQUEST, CULT, REPTSTATUS  Cardiac Enzymes: No results for input(s): CKTOTAL, CKMB, CKMBINDEX, TROPONINI in the last 168 hours. CBG: No results for input(s): GLUCAP in the last 168 hours. Iron Studies: No results for input(s): IRON, TIBC, TRANSFERRIN, FERRITIN in the last 72 hours. @lablastinr3 @ Studies/Results: No results found. Medications:

## 2015-02-15 NOTE — ED Provider Notes (Signed)
CSN: [id number]     Arrival date & time 02/15/15  1336 History   First MD Initiated Contact with Patient 02/15/15 1544     Chief Complaint  Patient presents with  . Vascular Access Problem   HPI   64 year old female presents today for dialysis. Patient reports she is from out of town and does not have dialysis Center here, reports she goes Monday Wednesday Friday made her last appointment Friday. Patient presents with no complaints, denies headache, fever, nausea, vomiting, chest pain, palpitations, abdominal pain, lower extremity swelling or edema. Denies signs of infection to her fistula.  Past Medical History  Diagnosis Date  . Hypertension   . HIV infection   . Morbid obesity   . ESRD (end stage renal disease) on dialysis 09/30/2013    [place] around [place].  ESRD due to HTN vs drug abuse according to pt.  [facility] until Feb 2015 when she was admitted to a SNF due to homelessness and drug abuse.  Then [facility] to [facility] TTS schedule.     . Anginal pain   . CHF (congestive heart failure)   . Shortness of breath   . Pneumonia   . Depression   . Anxiety   . GERD (gastroesophageal reflux disease)   . Arthritis   . Anemia   . Hyperkalemia 08/11/2014   Past Surgical History  Procedure Laterality Date  . Arteriovenous graft placement      left forearm  . Cardiac catheterization    . Laparotomy      states seh was cut open because seh was bleeding on the inside   Family History  Problem Relation Age of Onset  . Kidney failure Other     niece  . High blood pressure    . Lung cancer    . Breast cancer Neg Hx   . Colon cancer Neg Hx   . Stroke Mother   . HIV/AIDS Brother     died of AIDS   History  Substance Use Topics  . Smoking status: Current Every Day Smoker -- 0.50 packs/day    Types: Cigarettes    Start date: 05/06/2014  . Smokeless tobacco: Never Used     Comment: recently quit  . Alcohol Use: No   OB History     No data available     Review of Systems  All other systems reviewed and are negative.     Allergies  Tramadol and Morphine and related  Home Medications   Prior to Admission medications   Medication Sig Start Date End Date Taking? Authorizing Provider  amLODipine (NORVASC) 5 MG tablet Take 10 mg by mouth at bedtime.     Historical Provider, MD  calcitRIOL (ROCALTROL) 0.5 MCG capsule Take 2 mcg by mouth daily. Takes on Tuesday, Thursday, and Sat with Dialysis    Historical Provider, MD  cinacalcet (SENSIPAR) 90 MG tablet Take 90 mg by mouth daily.    Historical Provider, MD  colchicine 0.6 MG tablet Take 0.6 mg by mouth daily.    Historical Provider, MD  diphenhydrAMINE (BENADRYL) 25 mg capsule Take 50 mg by mouth at bedtime as needed for sleep.    Historical Provider, MD  FLUoxetine (PROZAC) 20 MG capsule Take 20 mg by mouth daily.    Historical Provider, MD  folic acid-vitamin b complex-vitamin c-selenium-zinc (DIALYVITE) 3 MG TABS tablet Take 1 tablet by mouth daily.    Historical Provider, MD  hydrALAZINE (APRESOLINE) 25 MG tablet Take 25 mg by mouth every 8 (eight) hours.    Historical Provider, MD  HYDROmorphone (DILAUDID) 4 MG tablet Take 0.5 tablets (2 mg total) by mouth 2 (two) times daily as needed for severe pain. 07/04/14   Tomasita Crumble, MD  isosorbide mononitrate (IMDUR) 30 MG 24 hr tablet Take 30 mg by mouth daily.    Historical Provider, MD  lamiVUDine (EPIVIR) 10 MG/ML solution Take 2.5 mLs (25 mg total) by mouth daily. 08/14/14   Erasmo Downer, MD  lamivudine (EPIVIR-HBV) 5 MG/ML solution Take 5 mLs (25 mg total) by mouth daily. 07/14/14   Gardiner Barefoot, MD  lidocaine (LIDODERM) 5 % Place 1 patch onto the skin daily. Remove & Discard patch within 12 hours or as directed by MD 08/14/14   Erasmo Downer, MD  LORazepam (ATIVAN) 0.5 MG tablet Take 0.5 mg by mouth every 12 (twelve) hours as needed for anxiety.    Historical Provider, MD  omeprazole (PRILOSEC) 20 MG  capsule Take 20 mg by mouth daily.    Historical Provider, MD  oxyCODONE-acetaminophen (PERCOCET/ROXICET) 5-325 MG per tablet Take 1 tablet by mouth every 8 (eight) hours as needed for severe pain.    Historical Provider, MD  polyethylene glycol (MIRALAX / GLYCOLAX) packet Take 17 g by mouth daily as needed for mild constipation.    Historical Provider, MD  raltegravir (ISENTRESS) 400 MG tablet Take 1 tablet (400 mg total) by mouth 2 (two) times daily. 07/14/14   Gardiner Barefoot, MD  raltegravir (ISENTRESS) 400 MG tablet Take 1 tablet (400 mg total) by mouth 2 (two) times daily. 08/14/14   Erasmo Downer, MD  sevelamer carbonate (RENVELA) 800 MG tablet Take 2,400 mg by mouth 3 (three) times daily with meals.    Historical Provider, MD  sevelamer carbonate (RENVELA) 800 MG tablet Take 3 tablets (2,400 mg total) by mouth 3 (three) times daily with meals. 08/14/14   Erasmo Downer, MD  tenofovir (VIREAD) 300 MG tablet Take 1 tablet (300 mg total) by mouth once a week. Give on Friday 07/14/14   Gardiner Barefoot, MD  tenofovir (VIREAD) 300 MG tablet Take 1 tablet (300 mg total) by mouth every Friday. 08/14/14   Erasmo Downer, MD   BP 173/92 mmHg  Pulse 97  Temp(Src) 98.2 F (36.8 C) (Oral)  Resp 24  Ht  (1.626 m)  Wt 254 lb (115.214 kg)  BMI 43.58 kg/m2  SpO2 98% Physical Exam  Constitutional: She is oriented to person, place, and time. She appears well-developed and well-nourished.  HENT:  Head: Normocephalic and atraumatic.  Eyes: Pupils are equal, round, and reactive to light.  Neck: Normal range of motion. Neck supple. No JVD present. No tracheal deviation present. No thyromegaly present.  Cardiovascular: Regular rhythm, normal heart sounds and intact distal pulses.  Exam reveals no gallop and no friction rub.   No murmur heard. Pulmonary/Chest: Effort normal and breath sounds normal. No stridor. No respiratory distress. She has no wheezes. She has no rales. She exhibits no  tenderness.  Abdominal: Soft. There is no tenderness.  Musculoskeletal: Normal range of motion.  Minimal lower extremity edema bilateral, baseline for patient.  Lymphadenopathy:    She has no cervical adenopathy.  Neurological: She is alert and oriented to person, place, and time. Coordination normal.  Skin: Skin is warm and dry.  Psychiatric: She has a normal mood and affect. Her behavior is normal. Judgment and thought  content normal.  Nursing note and vitals reviewed.   ED Course  Procedures (including critical care time) Labs Review Labs Reviewed  I-STAT CHEM 8, ED    Imaging Review No results found.   EKG Interpretation None      MDM   Final diagnoses:  Dialysis patient    Labs: I-STAT Chem-8 no significant findings relatively unchanged from previous  Imaging: None indicated  Consults: None  Therapeutics: None  Assessment: Dialysis patient  Plan: Patient presents today for her routine dialysis, she denies any complaints, reports that she made her last appointment on Friday and is due again today. Patient's exam is benign with no acute concerns or findings. Dialysis was contacted who recommended patient be seen by them today for her dialysis therapy. I-STAT Chem-8 showed no significant findings or changes over her baseline, potassium 4.8 today. Patient taking dialysis without complication.      Eyvonne MechanicJeffrey Stachia Slutsky, PA-C 02/15/15 2346  Richardean Canalavid H Yao, MD 02/16/15 [phone]

## 2015-02-16 LAB — HEPATITIS B SURFACE ANTIGEN: Hepatitis B Surface Ag: NEGATIVE

## 2015-02-17 ENCOUNTER — Encounter (HOSPITAL_COMMUNITY): Payer: Self-pay | Admitting: *Deleted

## 2015-02-17 ENCOUNTER — Non-Acute Institutional Stay (HOSPITAL_COMMUNITY)
Admission: EM | Admit: 2015-02-17 | Discharge: 2015-02-17 | Disposition: A | Discharge status: Home / Self Care | Payer: Medicare Other | Attending: Emergency Medicine | Admitting: Emergency Medicine

## 2015-02-17 ENCOUNTER — Emergency Department (HOSPITAL_COMMUNITY)
Admission: EM | Admit: 2015-02-17 | Discharge: 2015-02-17 | Disposition: A | Discharge status: Home / Self Care | Payer: Medicare Other

## 2015-02-17 DIAGNOSIS — I120 Hypertensive chronic kidney disease with stage 5 chronic kidney disease or end stage renal disease: Secondary | ICD-10-CM | POA: Diagnosis not present

## 2015-02-17 DIAGNOSIS — Z992 Dependence on renal dialysis: Secondary | ICD-10-CM | POA: Insufficient documentation

## 2015-02-17 DIAGNOSIS — N186 End stage renal disease: Secondary | ICD-10-CM

## 2015-02-17 LAB — RENAL FUNCTION PANEL
ANION GAP: 17 — AB (ref 5–15)
Albumin: 3.4 g/dL — ABNORMAL LOW (ref 3.5–5.0)
BUN: 47 mg/dL — ABNORMAL HIGH (ref 6–20)
CHLORIDE: 98 mmol/L — AB (ref 101–111)
CO2: 21 mmol/L — ABNORMAL LOW (ref 22–32)
Calcium: 7.1 mg/dL — ABNORMAL LOW (ref 8.9–10.3)
Creatinine, Ser: 9.32 mg/dL — ABNORMAL HIGH (ref 0.44–1.00)
GFR calc Af Amer: 5 mL/min — ABNORMAL LOW (ref >60)
GFR calc non Af Amer: 4 mL/min — ABNORMAL LOW (ref >60)
Glucose, Bld: 162 mg/dL — ABNORMAL HIGH (ref 70–99)
PHOSPHORUS: 4.1 mg/dL (ref 2.5–4.6)
POTASSIUM: 4.9 mmol/L (ref 3.5–5.1)
SODIUM: 136 mmol/L (ref 135–145)

## 2015-02-17 LAB — CBC
HCT: 36.6 % (ref 36.0–46.0)
Hemoglobin: 11.4 g/dL — ABNORMAL LOW (ref 12.0–15.0)
MCH: 34.3 pg — ABNORMAL HIGH (ref 26.0–34.0)
MCHC: 31.1 g/dL (ref 30.0–36.0)
MCV: 110.2 fL — ABNORMAL HIGH (ref 78.0–100.0)
Platelets: 246 10*3/uL (ref 150–400)
RBC: 3.32 MIL/uL — ABNORMAL LOW (ref 3.87–5.11)
RDW: 14 % (ref 11.5–15.5)
WBC: 8.9 10*3/uL (ref 4.0–10.5)

## 2015-02-17 MED ORDER — NEPRO/CARBSTEADY PO LIQD: 237.0000 mL | ORAL | Status: DC | PRN

## 2015-02-17 MED ORDER — HEPARIN SODIUM (PORCINE) 1000 UNIT/ML DIALYSIS: 1000.0000 [IU] | INTRAMUSCULAR | Status: DC | PRN

## 2015-02-17 MED ORDER — PENTAFLUOROPROP-TETRAFLUOROETH EX AERO: 1.0000 "application " | INHALATION_SPRAY | CUTANEOUS | Status: DC | PRN

## 2015-02-17 MED ORDER — SODIUM CHLORIDE 0.9 % IV SOLN: 100.0000 mL | INTRAVENOUS | Status: DC | PRN

## 2015-02-17 MED ORDER — ALTEPLASE 2 MG IJ SOLR: 2.0000 mg | Freq: Once | INTRAMUSCULAR | Status: DC | PRN

## 2015-02-17 MED ORDER — HEPARIN SODIUM (PORCINE) 1000 UNIT/ML DIALYSIS: 9000.0000 [IU] | Freq: Once | INTRAMUSCULAR | Status: DC

## 2015-02-17 MED ORDER — LIDOCAINE-PRILOCAINE 2.5-2.5 % EX CREA: 1.0000 "application " | TOPICAL_CREAM | CUTANEOUS | Status: DC | PRN

## 2015-02-17 MED ORDER — LIDOCAINE HCL (PF) 1 % IJ SOLN: 5.0000 mL | INTRAMUSCULAR | Status: DC | PRN

## 2015-02-17 MED ORDER — HEPARIN SODIUM (PORCINE) 1000 UNIT/ML DIALYSIS: 9800.0000 [IU] | Freq: Once | INTRAMUSCULAR | Status: DC

## 2015-02-17 NOTE — ED Notes (Signed)
Report to [place], Charity fundraiserN. Breakfast tray given.

## 2015-02-17 NOTE — Progress Notes (Signed)
Pt requested to end dialysis treatment 1 hour early related to cramping.  I offered to cut the UF off until cramps were relieved.  Pt refused and stated she wanted to come off.  Jetty DuhamelBridget Whelan, NP notified of pt request.  Obtained signature for Early Termination of Hemodialysis AMA and pt blood rinsed back.  VSS and pt being discharged to home with self care.
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
## 2015-02-17 NOTE — Progress Notes (Addendum)
LCSW aware of consult for permanent dialysis placement needed. Patient was seen on 5/4 by case management:   Patient is not a candidate for placement at this time unless admitted, completes a 3 day qualifying stay as her insurance is Medicare.  This was also discussed in last admission to ED on 5/4.  Patient does not want to give up Medicaid check and she does not qualify for a LOG.    Spoke to [place] in HD r/t CLIP process for pt. Darel HongJudy states pt has been denied and fired from several HD centers in this area. Pt [place] [facility] [place] for HD  At this time, patient's only option is to return to the ED for HD as she has no other options.  LCSW discussed case with CM and she agrees with plan.  Deretha EmoryHannah Jamelyn Bovard LCSW, MSW Clinical Social Work: Emergency Room [phone]

## 2015-02-17 NOTE — Discharge Planning (Signed)
Spoke to [place] in HD r/t CLIP process for pt. Darel HongJudy states pt has been denied and fired from several HD centers in this area. Pt will have to [place] [facility] or return to ER for HD.  Pt aware and nephrology aware.

## 2015-02-17 NOTE — ED Notes (Signed)
Spoke with Dialysis unit-- ok to order breakfast for pt--- service response notified for tray.

## 2015-02-17 NOTE — ED Notes (Signed)
Patient presents via [facility]  For dialysis (Mon, Wed, Fri) No complaints

## 2015-02-18 ENCOUNTER — Emergency Department (HOSPITAL_COMMUNITY)
Admission: EM | Admit: 2015-02-18 | Discharge: 2015-02-18 | Disposition: A | Discharge status: Home / Self Care | Payer: Medicare Other | Attending: Emergency Medicine | Admitting: Emergency Medicine

## 2015-02-18 ENCOUNTER — Encounter (HOSPITAL_COMMUNITY): Payer: Self-pay

## 2015-02-18 ENCOUNTER — Emergency Department (HOSPITAL_COMMUNITY): Payer: Medicare Other

## 2015-02-18 DIAGNOSIS — S60414A Abrasion of right ring finger, initial encounter: Secondary | ICD-10-CM | POA: Diagnosis not present

## 2015-02-18 DIAGNOSIS — Y939 Activity, unspecified: Secondary | ICD-10-CM | POA: Diagnosis not present

## 2015-02-18 DIAGNOSIS — N186 End stage renal disease: Secondary | ICD-10-CM | POA: Diagnosis not present

## 2015-02-18 DIAGNOSIS — D649 Anemia, unspecified: Secondary | ICD-10-CM | POA: Diagnosis not present

## 2015-02-18 DIAGNOSIS — Z9889 Other specified postprocedural states: Secondary | ICD-10-CM | POA: Diagnosis not present

## 2015-02-18 DIAGNOSIS — Z21 Asymptomatic human immunodeficiency virus [HIV] infection status: Secondary | ICD-10-CM | POA: Insufficient documentation

## 2015-02-18 DIAGNOSIS — I509 Heart failure, unspecified: Secondary | ICD-10-CM | POA: Insufficient documentation

## 2015-02-18 DIAGNOSIS — Z992 Dependence on renal dialysis: Secondary | ICD-10-CM | POA: Diagnosis not present

## 2015-02-18 DIAGNOSIS — Y999 Unspecified external cause status: Secondary | ICD-10-CM | POA: Diagnosis not present

## 2015-02-18 DIAGNOSIS — W230XXA Caught, crushed, jammed, or pinched between moving objects, initial encounter: Secondary | ICD-10-CM | POA: Insufficient documentation

## 2015-02-18 DIAGNOSIS — Z8701 Personal history of pneumonia (recurrent): Secondary | ICD-10-CM | POA: Diagnosis not present

## 2015-02-18 DIAGNOSIS — M1990 Unspecified osteoarthritis, unspecified site: Secondary | ICD-10-CM | POA: Diagnosis not present

## 2015-02-18 DIAGNOSIS — Y929 Unspecified place or not applicable: Secondary | ICD-10-CM | POA: Insufficient documentation

## 2015-02-18 DIAGNOSIS — K219 Gastro-esophageal reflux disease without esophagitis: Secondary | ICD-10-CM | POA: Insufficient documentation

## 2015-02-18 DIAGNOSIS — Z23 Encounter for immunization: Secondary | ICD-10-CM | POA: Insufficient documentation

## 2015-02-18 DIAGNOSIS — F329 Major depressive disorder, single episode, unspecified: Secondary | ICD-10-CM | POA: Diagnosis not present

## 2015-02-18 DIAGNOSIS — S62609A Fracture of unspecified phalanx of unspecified finger, initial encounter for closed fracture: Secondary | ICD-10-CM

## 2015-02-18 DIAGNOSIS — Z720 Tobacco use: Secondary | ICD-10-CM | POA: Diagnosis not present

## 2015-02-18 DIAGNOSIS — Z79899 Other long term (current) drug therapy: Secondary | ICD-10-CM | POA: Diagnosis not present

## 2015-02-18 DIAGNOSIS — I120 Hypertensive chronic kidney disease with stage 5 chronic kidney disease or end stage renal disease: Secondary | ICD-10-CM | POA: Diagnosis not present

## 2015-02-18 DIAGNOSIS — I209 Angina pectoris, unspecified: Secondary | ICD-10-CM | POA: Diagnosis not present

## 2015-02-18 DIAGNOSIS — S6991XA Unspecified injury of right wrist, hand and finger(s), initial encounter: Secondary | ICD-10-CM | POA: Diagnosis present

## 2015-02-18 DIAGNOSIS — S62664A Nondisplaced fracture of distal phalanx of right ring finger, initial encounter for closed fracture: Secondary | ICD-10-CM | POA: Insufficient documentation

## 2015-02-18 DIAGNOSIS — F419 Anxiety disorder, unspecified: Secondary | ICD-10-CM | POA: Diagnosis not present

## 2015-02-18 MED ORDER — OXYCODONE-ACETAMINOPHEN 5-325 MG PO TABS
1.0000 | ORAL_TABLET | Freq: Three times a day (TID) | ORAL | Status: DC | PRN
Start: 2015-02-18 — End: 2015-02-24

## 2015-02-18 MED ORDER — OXYCODONE-ACETAMINOPHEN 5-325 MG PO TABS
1.0000 | ORAL_TABLET | Freq: Once | ORAL | Status: AC
  Administered 2015-02-18: 1 via ORAL
  Filled 2015-02-18: qty 1

## 2015-02-18 MED ORDER — AMOXICILLIN-POT CLAVULANATE 875-125 MG PO TABS: 1.0000 | ORAL_TABLET | Freq: Two times a day (BID) | ORAL | Status: DC

## 2015-02-18 MED ORDER — LIDOCAINE HCL (PF) 1 % IJ SOLN
5.0000 mL | Freq: Once | INTRAMUSCULAR | Status: AC
  Administered 2015-02-18: 5 mL
  Filled 2015-02-18: qty 5

## 2015-02-18 MED ORDER — TETANUS-DIPHTH-ACELL PERTUSSIS 5-2.5-18.5 LF-MCG/0.5 IM SUSP
0.5000 mL | Freq: Once | INTRAMUSCULAR | Status: AC
  Administered 2015-02-18: 0.5 mL via INTRAMUSCULAR
  Filled 2015-02-18: qty 0.5

## 2015-02-18 NOTE — Discharge Instructions (Signed)
Finger Fracture °A finger fracture is when one or more bones in the finger break.  °HOME CARE  °· Wear the splint, tape, or cast as long as told by your doctor. °· Keep your fingers in the position your doctor tell you to. °· Raise (elevate) the injured area above the level of the heart. °· Only take medicine as told by your doctor. °· Put ice on the injured area. °¨ Put ice in a plastic bag. °¨ Place a towel between the skin and the bag. °¨ Leave the ice on for 15-20 minutes, 03-04 times a day. °· Follow up with your doctor. °· Ask what exercises you can do when the splint comes off. °GET HELP RIGHT AWAY IF:  °· The fingernails are white or bluish. °· You have pain not helped by medicine. °· You cannot move your fingertips. °· You lose feeling (numbness) in the injured finger(s). °MAKE SURE YOU:  °· Understand these instructions. °· Will watch this condition. °· Will get help right away if you are not doing well or get worse. °Document Released: 03/13/2008 Document Revised: 12/18/2011 Document Reviewed: 03/13/2008 °[employer]® Patient Information ©2015 [employer]. This information is not intended to replace advice given to you by your health care provider. Make sure you discuss any questions you have with your health care provider. ° °

## 2015-02-18 NOTE — ED Provider Notes (Signed)
CSN: [id number]     Arrival date & time 02/18/15  1612 History  This chart was scribed for non-physician practitioner, Felicie Mornavid Champion Corales, NP working with Linwood DibblesJon Knapp, MD by Buckner MaltaJason Robinson, ED scribe. This patient was seen in room TR06C/TR06C and the patient's care was started at 4:23 PM  Chief Complaint  Patient presents with  . Finger Injury     (Consider location/radiation/quality/duration/timing/severity/associated sxs/prior Treatment) Patient is a 64 y.o. female presenting with extremity pain. The history is provided by the patient. No language interpreter was used.  Extremity Pain This is a new problem. The current episode started yesterday. The problem occurs rarely. The problem has not changed since onset.Pertinent negatives include no chest pain, no abdominal pain and no shortness of breath. Exacerbated by: palpation, movement. Nothing relieves the symptoms. She has tried nothing for the symptoms. The treatment provided no relief.    HPI Comments: [name] is a 64 y.o. female dialysis patient who presents to the Emergency Department complaining  right 4th finger injury sustained yesterday. The patient states that she got her finger caught in a wheelchair as she was sitting in it. The patient lists abrasion, tenderness to palpation, constant, severe aching pain since yesterday with radiation to her right arm as associated symptoms.  She is a previous dialysis patient, who just finished her dialysis yesterday. The patient denies SOB and abdominal pain, bilateral lower extremity pain, and hip pain as associated symptoms. Patient reports that she is not UTD on tetanus.      Past Medical History  Diagnosis Date  . Hypertension   . HIV infection   . Morbid obesity   . ESRD (end stage renal disease) on dialysis 09/30/2013    [place] in [place], [place] around 2009.  ESRD due to HTN vs drug abuse according to pt.  [facility] 2015 when she was admitted  to a SNF due to homelessness and drug abuse.  Then [facility] schedule.     . Anginal pain   . CHF (congestive heart failure)   . Shortness of breath   . Pneumonia   . Depression   . Anxiety   . GERD (gastroesophageal reflux disease)   . Arthritis   . Anemia   . Hyperkalemia 08/11/2014   Past Surgical History  Procedure Laterality Date  . Arteriovenous graft placement      left forearm  . Cardiac catheterization    . Laparotomy      states seh was cut open because seh was bleeding on the inside   Family History  Problem Relation Age of Onset  . Kidney failure Other     niece  . High blood pressure    . Lung cancer    . Breast cancer Neg Hx   . Colon cancer Neg Hx   . Stroke Mother   . HIV/AIDS Brother     died of AIDS   History  Substance Use Topics  . Smoking status: Current Every Day Smoker -- 0.50 packs/day    Types: Cigarettes    Start date: 05/06/2014  . Smokeless tobacco: Never Used     Comment: recently quit  . Alcohol Use: No   OB History    No data available     Review of Systems  Respiratory: Negative for shortness of breath.   Cardiovascular: Negative for chest pain.  Gastrointestinal: Negative for abdominal pain.  Musculoskeletal: Positive for myalgias.  Skin:  Positive for wound.  All other systems reviewed and are negative.    Allergies  Tramadol and Morphine and related  Home Medications   Prior to Admission medications   Medication Sig Start Date End Date Taking? Authorizing Provider  amLODipine (NORVASC) 5 MG tablet Take 10 mg by mouth at bedtime.     Historical Provider, MD  calcitRIOL (ROCALTROL) 0.5 MCG capsule Take 2 mcg by mouth daily. Takes on Tuesday, Thursday, and Sat with Dialysis    Historical Provider, MD  cinacalcet (SENSIPAR) 90 MG tablet Take 90 mg by mouth daily.    Historical Provider, MD  colchicine 0.6 MG tablet Take 0.6 mg by mouth daily.    Historical Provider, MD  diphenhydrAMINE (BENADRYL) 25 mg  capsule Take 50 mg by mouth at bedtime as needed for sleep.    Historical Provider, MD  FLUoxetine (PROZAC) 20 MG capsule Take 20 mg by mouth daily.    Historical Provider, MD  folic acid-vitamin b complex-vitamin c-selenium-zinc (DIALYVITE) 3 MG TABS tablet Take 1 tablet by mouth daily.    Historical Provider, MD  hydrALAZINE (APRESOLINE) 25 MG tablet Take 25 mg by mouth every 8 (eight) hours.    Historical Provider, MD  HYDROmorphone (DILAUDID) 4 MG tablet Take 0.5 tablets (2 mg total) by mouth 2 (two) times daily as needed for severe pain. 07/04/14   Tomasita CrumbleAdeleke Oni, MD  isosorbide mononitrate (IMDUR) 30 MG 24 hr tablet Take 30 mg by mouth daily.    Historical Provider, MD  lamiVUDine (EPIVIR) 10 MG/ML solution Take 2.5 mLs (25 mg total) by mouth daily. 08/14/14   Erasmo DownerAngela M Bacigalupo, MD  lamivudine (EPIVIR-HBV) 5 MG/ML solution Take 5 mLs (25 mg total) by mouth daily. 07/14/14   Gardiner Barefootobert W Comer, MD  lidocaine (LIDODERM) 5 % Place 1 patch onto the skin daily. Remove & Discard patch within 12 hours or as directed by MD 08/14/14   Erasmo DownerAngela M Bacigalupo, MD  LORazepam (ATIVAN) 0.5 MG tablet Take 0.5 mg by mouth every 12 (twelve) hours as needed for anxiety.    Historical Provider, MD  omeprazole (PRILOSEC) 20 MG capsule Take 20 mg by mouth daily.    Historical Provider, MD  oxyCODONE-acetaminophen (PERCOCET/ROXICET) 5-325 MG per tablet Take 1 tablet by mouth every 8 (eight) hours as needed for severe pain.    Historical Provider, MD  polyethylene glycol (MIRALAX / GLYCOLAX) packet Take 17 g by mouth daily as needed for mild constipation.    Historical Provider, MD  raltegravir (ISENTRESS) 400 MG tablet Take 1 tablet (400 mg total) by mouth 2 (two) times daily. 07/14/14   Gardiner Barefootobert W Comer, MD  raltegravir (ISENTRESS) 400 MG tablet Take 1 tablet (400 mg total) by mouth 2 (two) times daily. 08/14/14   Erasmo DownerAngela M Bacigalupo, MD  sevelamer carbonate (RENVELA) 800 MG tablet Take 2,400 mg by mouth 3 (three) times daily with  meals.    Historical Provider, MD  sevelamer carbonate (RENVELA) 800 MG tablet Take 3 tablets (2,400 mg total) by mouth 3 (three) times daily with meals. 08/14/14   Erasmo DownerAngela M Bacigalupo, MD  tenofovir (VIREAD) 300 MG tablet Take 1 tablet (300 mg total) by mouth once a week. Give on Friday 07/14/14   Gardiner Barefootobert W Comer, MD  tenofovir (VIREAD) 300 MG tablet Take 1 tablet (300 mg total) by mouth every Friday. 08/14/14   Erasmo DownerAngela M Bacigalupo, MD   BP 121/70 mmHg  Pulse 110  Temp(Src) 98.1 F (36.7 C) (Oral)  SpO2 100% Physical Exam  Constitutional: She is oriented to person, place, and time. She appears well-developed and well-nourished. No distress.  HENT:  Head: Normocephalic and atraumatic.  Eyes: Conjunctivae and EOM are normal.  Neck: Neck supple.  Cardiovascular: Normal rate.   Pulmonary/Chest: Breath sounds normal. No respiratory distress.  Musculoskeletal: She exhibits edema and tenderness.  Abrasion over DIP over right ring finger with swelling and tenderness with radiation to the right arm  Neurological: She is alert and oriented to person, place, and time.  Skin: Skin is warm. Abrasion noted.  Psychiatric: Her behavior is normal.  Nursing note and vitals reviewed.   ED Course  Procedures (including critical care time) DIAGNOSTIC STUDIES: Oxygen Saturation is 100% on RA, Normal by my interpretation.    COORDINATION OF CARE: 4:27 PM Discussed treatment plan at bedside including T-dap injection and oxycodone-acetominophen . Pt agreed to plan.   Labs Review Labs Reviewed - No data to display  Imaging Review Dg Finger Ring Right  02/18/2015   CLINICAL DATA:  Crush injury with wheelchair of the fourth digit  EXAM: RIGHT RING FINGER 2+V  COMPARISON:  None.  FINDINGS: There is a nondisplaced fracture through the distal aspect of the fourth middle phalanx. Soft tissue swelling is noted.  IMPRESSION: Fracture through the distal aspect of the fourth middle phalanx.   Electronically Signed    By: Alcide Clever M.D.   On: 02/18/2015 16:54     EKG Interpretation None     Radiology results reviewed and shared with patient.  Fracture to distal aspect of middle phalanx of fourth finger right hand.  Superficial overlying abrasion on the dorsal aspect. Digital block performed, aggressive wound care provided. No evidence of flexor or extensor tendon involvement. Tetanus updated.  Discussed with Dr. Fortino Sic err on the side of caution and initiate antibiotic coverage.  Follow-up with hand. MDM   Final diagnoses:  None    Distal fracture middle phalanx right fourth finger.  I personally performed the services described in this documentation, which was scribed in my presence. The recorded information has been reviewed and is accurate.      Felicie Morn, NP 02/18/15 2118  Linwood Dibbles, MD 02/21/15 [phone]

## 2015-02-18 NOTE — ED Notes (Signed)
PER EMS: pt from home, was sitting in wheelchair and jammed her finger yesterday in the wheelchair handle. Dialysis pt. HIV+. This occurred after she finished dialysis yesterday. She said it went well and she doesn't miss her dialysis treatments.

## 2015-02-19 ENCOUNTER — Emergency Department (HOSPITAL_COMMUNITY)
Admission: EM | Admit: 2015-02-19 | Discharge: 2015-02-19 | Disposition: A | Discharge status: Home / Self Care | Payer: Medicare Other | Attending: Emergency Medicine | Admitting: Emergency Medicine

## 2015-02-19 ENCOUNTER — Encounter (HOSPITAL_COMMUNITY): Payer: Self-pay | Admitting: Emergency Medicine

## 2015-02-19 DIAGNOSIS — Z21 Asymptomatic human immunodeficiency virus [HIV] infection status: Secondary | ICD-10-CM | POA: Insufficient documentation

## 2015-02-19 DIAGNOSIS — Z720 Tobacco use: Secondary | ICD-10-CM | POA: Insufficient documentation

## 2015-02-19 DIAGNOSIS — Z8701 Personal history of pneumonia (recurrent): Secondary | ICD-10-CM | POA: Diagnosis not present

## 2015-02-19 DIAGNOSIS — K219 Gastro-esophageal reflux disease without esophagitis: Secondary | ICD-10-CM | POA: Diagnosis not present

## 2015-02-19 DIAGNOSIS — F329 Major depressive disorder, single episode, unspecified: Secondary | ICD-10-CM | POA: Diagnosis not present

## 2015-02-19 DIAGNOSIS — Z992 Dependence on renal dialysis: Secondary | ICD-10-CM | POA: Diagnosis not present

## 2015-02-19 DIAGNOSIS — M79644 Pain in right finger(s): Secondary | ICD-10-CM | POA: Insufficient documentation

## 2015-02-19 DIAGNOSIS — M1990 Unspecified osteoarthritis, unspecified site: Secondary | ICD-10-CM | POA: Insufficient documentation

## 2015-02-19 DIAGNOSIS — I509 Heart failure, unspecified: Secondary | ICD-10-CM | POA: Insufficient documentation

## 2015-02-19 DIAGNOSIS — D649 Anemia, unspecified: Secondary | ICD-10-CM | POA: Diagnosis not present

## 2015-02-19 DIAGNOSIS — Z4931 Encounter for adequacy testing for hemodialysis: Secondary | ICD-10-CM | POA: Diagnosis present

## 2015-02-19 DIAGNOSIS — I120 Hypertensive chronic kidney disease with stage 5 chronic kidney disease or end stage renal disease: Secondary | ICD-10-CM | POA: Diagnosis not present

## 2015-02-19 DIAGNOSIS — Z79899 Other long term (current) drug therapy: Secondary | ICD-10-CM | POA: Diagnosis not present

## 2015-02-19 DIAGNOSIS — N186 End stage renal disease: Secondary | ICD-10-CM

## 2015-02-19 DIAGNOSIS — Z9889 Other specified postprocedural states: Secondary | ICD-10-CM | POA: Insufficient documentation

## 2015-02-19 DIAGNOSIS — I209 Angina pectoris, unspecified: Secondary | ICD-10-CM | POA: Insufficient documentation

## 2015-02-19 LAB — I-STAT CHEM 8, ED
BUN: 52 mg/dL — ABNORMAL HIGH (ref 6–20)
CREATININE: 10.4 mg/dL — AB (ref 0.44–1.00)
Calcium, Ion: 0.83 mmol/L — ABNORMAL LOW (ref 1.13–1.30)
Chloride: 102 mmol/L (ref 101–111)
Glucose, Bld: 171 mg/dL — ABNORMAL HIGH (ref 65–99)
HCT: 39 % (ref 36.0–46.0)
HEMOGLOBIN: 13.3 g/dL (ref 12.0–15.0)
POTASSIUM: 4.7 mmol/L (ref 3.5–5.1)
SODIUM: 132 mmol/L — AB (ref 135–145)
TCO2: 20 mmol/L (ref 0–100)

## 2015-02-19 NOTE — ED Notes (Signed)
Received pt via [facility] with c/o initial call came out that she wanted her finger re-taped. After [facility] re-taped finger pt then requested to be transported to the ER for dialysis. Pt seen here yesterday for injury to finger. [place] a dialysis center here in [place].

## 2015-02-19 NOTE — Progress Notes (Signed)
NCM consulted r/t transportation for pt from HD.  Pt aware that she does not have scheduled days for HD because she does not have regular HD center in [place].  Pt knows [facility] has [facility].

## 2015-02-19 NOTE — ED Provider Notes (Signed)
CSN: [id number]     Arrival date & time 02/19/15  0757 History   First MD Initiated Contact with Patient 02/19/15 0802     No chief complaint on file.    (Consider location/radiation/quality/duration/timing/severity/associated sxs/prior Treatment) HPI Comments: Patient presents requesting dialysis. Patient has end-stage renal patient, on chronic dialysis, Monday Wednesday Friday. Patient reports that she is currently without a dialysis center. Her last dialysis was 2 days ago. She reports that she has been working through disability and Medicaid, trying to get a dialysis center, but has not been assigned yet. Patient is without complaints other than right ring finger pain, reports that it was broken 2 days ago.   Past Medical History  Diagnosis Date  . Hypertension   . HIV infection   . Morbid obesity   . ESRD (end stage renal disease) on dialysis 09/30/2013    [place] around 2009.  ESRD due to HTN vs drug abuse according to pt.  [facility] Feb 2015 when she was admitted to a SNF due to homelessness and drug abuse.  Then [facility] schedule.     . Anginal pain   . CHF (congestive heart failure)   . Shortness of breath   . Pneumonia   . Depression   . Anxiety   . GERD (gastroesophageal reflux disease)   . Arthritis   . Anemia   . Hyperkalemia 08/11/2014   Past Surgical History  Procedure Laterality Date  . Arteriovenous graft placement      left forearm  . Cardiac catheterization    . Laparotomy      states seh was cut open because seh was bleeding on the inside   Family History  Problem Relation Age of Onset  . Kidney failure Other     niece  . High blood pressure    . Lung cancer    . Breast cancer Neg Hx   . Colon cancer Neg Hx   . Stroke Mother   . HIV/AIDS Brother     died of AIDS   History  Substance Use Topics  . Smoking status: Current Every Day Smoker -- 0.50 packs/day    Types: Cigarettes     Start date: 05/06/2014  . Smokeless tobacco: Never Used     Comment: recently quit  . Alcohol Use: No   OB History    No data available     Review of Systems  Respiratory: Negative for shortness of breath.   Musculoskeletal:       Finger pain  All other systems reviewed and are negative.     Allergies  Tramadol and Morphine and related  Home Medications   Prior to Admission medications   Medication Sig Start Date End Date Taking? Authorizing Provider  amLODipine (NORVASC) 5 MG tablet Take 10 mg by mouth at bedtime.     Historical Provider, MD  amoxicillin-clavulanate (AUGMENTIN) 875-125 MG per tablet Take 1 tablet by mouth 2 (two) times daily. One po bid x 7 days 02/18/15   Felicie Mornavid Smith, NP  calcitRIOL (ROCALTROL) 0.5 MCG capsule Take 2 mcg by mouth daily. Takes on Tuesday, Thursday, and Sat with Dialysis    Historical Provider, MD  cinacalcet (SENSIPAR) 90 MG tablet Take 90 mg by mouth daily.    Historical Provider, MD  colchicine 0.6 MG tablet Take 0.6 mg by mouth daily.    Historical Provider, MD  diphenhydrAMINE (BENADRYL) 25 mg capsule  Take 50 mg by mouth at bedtime as needed for sleep.    Historical Provider, MD  FLUoxetine (PROZAC) 20 MG capsule Take 20 mg by mouth daily.    Historical Provider, MD  folic acid-vitamin b complex-vitamin c-selenium-zinc (DIALYVITE) 3 MG TABS tablet Take 1 tablet by mouth daily.    Historical Provider, MD  hydrALAZINE (APRESOLINE) 25 MG tablet Take 25 mg by mouth every 8 (eight) hours.    Historical Provider, MD  HYDROmorphone (DILAUDID) 4 MG tablet Take 0.5 tablets (2 mg total) by mouth 2 (two) times daily as needed for severe pain. 07/04/14   Tomasita Crumble, MD  isosorbide mononitrate (IMDUR) 30 MG 24 hr tablet Take 30 mg by mouth daily.    Historical Provider, MD  lamiVUDine (EPIVIR) 10 MG/ML solution Take 2.5 mLs (25 mg total) by mouth daily. 08/14/14   Erasmo Downer, MD  lamivudine (EPIVIR-HBV) 5 MG/ML solution Take 5 mLs (25 mg  total) by mouth daily. 07/14/14   Gardiner Barefoot, MD  lidocaine (LIDODERM) 5 % Place 1 patch onto the skin daily. Remove & Discard patch within 12 hours or as directed by MD 08/14/14   Erasmo Downer, MD  LORazepam (ATIVAN) 0.5 MG tablet Take 0.5 mg by mouth every 12 (twelve) hours as needed for anxiety.    Historical Provider, MD  omeprazole (PRILOSEC) 20 MG capsule Take 20 mg by mouth daily.    Historical Provider, MD  oxyCODONE-acetaminophen (PERCOCET/ROXICET) 5-325 MG per tablet Take 1 tablet by mouth every 8 (eight) hours as needed for severe pain. 02/18/15   Felicie Morn, NP  polyethylene glycol [facility] Ethelene Hal) packet Take 17 g by mouth daily as needed for mild constipation.    Historical Provider, MD  raltegravir (ISENTRESS) 400 MG tablet Take 1 tablet (400 mg total) by mouth 2 (two) times daily. 07/14/14   Gardiner Barefoot, MD  raltegravir (ISENTRESS) 400 MG tablet Take 1 tablet (400 mg total) by mouth 2 (two) times daily. 08/14/14   Erasmo Downer, MD  sevelamer carbonate (RENVELA) 800 MG tablet Take 2,400 mg by mouth 3 (three) times daily with meals.    Historical Provider, MD  sevelamer carbonate (RENVELA) 800 MG tablet Take 3 tablets (2,400 mg total) by mouth 3 (three) times daily with meals. 08/14/14   Erasmo Downer, MD  tenofovir (VIREAD) 300 MG tablet Take 1 tablet (300 mg total) by mouth once a week. Give on Friday 07/14/14   Gardiner Barefoot, MD  tenofovir (VIREAD) 300 MG tablet Take 1 tablet (300 mg total) by mouth every Friday. 08/14/14   Erasmo Downer, MD   There were no vitals taken for this visit. Physical Exam  Constitutional: She is oriented to person, place, and time. She appears well-developed and well-nourished. No distress.  HENT:  Head: Normocephalic and atraumatic.  Right Ear: Hearing normal.  Left Ear: Hearing normal.  Nose: Nose normal.  Mouth/Throat: Oropharynx is clear and moist and mucous membranes are normal.  Eyes: Conjunctivae and EOM are  normal. Pupils are equal, round, and reactive to light.  Neck: Normal range of motion. Neck supple.  Cardiovascular: Regular rhythm, S1 normal and S2 normal.  Exam reveals no gallop and no friction rub.   No murmur heard. Pulmonary/Chest: Effort normal and breath sounds normal. No respiratory distress. She exhibits no tenderness.  Abdominal: Soft. Normal appearance and bowel sounds are normal. There is no hepatosplenomegaly. There is no tenderness. There is no rebound, no guarding, no tenderness  at McBurney's point and negative Murphy's sign. No hernia.  Musculoskeletal: Normal range of motion.       Hands: Neurological: She is alert and oriented to person, place, and time. She has normal strength. No cranial nerve deficit or sensory deficit. Coordination normal. GCS eye subscore is 4. GCS verbal subscore is 5. GCS motor subscore is 6.  Skin: Skin is warm, dry and intact. No rash noted. No cyanosis.  Psychiatric: She has a normal mood and affect. Her speech is normal and behavior is normal. Thought content normal.  Nursing note and vitals reviewed.   ED Course  Procedures (including critical care time) Labs Review Labs Reviewed - No data to display  Imaging Review Dg Finger Ring Right  02/18/2015   CLINICAL DATA:  Crush injury with wheelchair of the fourth digit  EXAM: RIGHT RING FINGER 2+V  COMPARISON:  None.  FINDINGS: There is a nondisplaced fracture through the distal aspect of the fourth middle phalanx. Soft tissue swelling is noted.  IMPRESSION: Fracture through the distal aspect of the fourth middle phalanx.   Electronically Signed   By: Alcide CleverMark  Lukens M.D.   On: 02/18/2015 16:54     EKG Interpretation None      MDM   Final diagnoses:  None   end-stage renal disease  She presents to the ER for dialysis. Patient is end-stage renal disease, hemodialysis dependent. She does not currently have a dialysis center. She has been coming to the ER periodically for her dialysis. Her last  dialysis was 5 days ago. She is without complaints today other than finger pain, was seen yesterday for this and diagnosed with fracture.  Examination does not reveal any concern for significant volume overload. Patient electrolytes are unremarkable, no significant hyperkalemia. Discussed briefly with Dr. Kathrene BongoGoldsborough, will be evaluated for dialysis today.    Gilda Creasehristopher J Simcha Speir, MD 02/19/15 1049

## 2015-02-19 NOTE — ED Notes (Signed)
Pt given turkey sandwich and coffee

## 2015-02-22 ENCOUNTER — Encounter (HOSPITAL_COMMUNITY): Payer: Self-pay | Admitting: Nurse Practitioner

## 2015-02-22 ENCOUNTER — Non-Acute Institutional Stay (HOSPITAL_COMMUNITY)
Admission: EM | Admit: 2015-02-22 | Discharge: 2015-02-23 | Disposition: A | Discharge status: Home / Self Care | Payer: Medicare Other | Attending: Emergency Medicine | Admitting: Emergency Medicine

## 2015-02-22 DIAGNOSIS — N186 End stage renal disease: Secondary | ICD-10-CM | POA: Diagnosis not present

## 2015-02-22 DIAGNOSIS — Z992 Dependence on renal dialysis: Secondary | ICD-10-CM

## 2015-02-22 LAB — CBC
HCT: 32.4 % — ABNORMAL LOW (ref 36.0–46.0)
Hemoglobin: 10.4 g/dL — ABNORMAL LOW (ref 12.0–15.0)
MCH: 35.3 pg — ABNORMAL HIGH (ref 26.0–34.0)
MCHC: 32.1 g/dL (ref 30.0–36.0)
MCV: 109.8 fL — ABNORMAL HIGH (ref 78.0–100.0)
Platelets: 247 10*3/uL (ref 150–400)
RBC: 2.95 MIL/uL — ABNORMAL LOW (ref 3.87–5.11)
RDW: 14.3 % (ref 11.5–15.5)
WBC: 9.2 10*3/uL (ref 4.0–10.5)

## 2015-02-22 LAB — BASIC METABOLIC PANEL
Anion gap: 16 — ABNORMAL HIGH (ref 5–15)
BUN: 92 mg/dL — ABNORMAL HIGH (ref 6–20)
CO2: 20 mmol/L — ABNORMAL LOW (ref 22–32)
Calcium: 7 mg/dL — ABNORMAL LOW (ref 8.9–10.3)
Chloride: 101 mmol/L (ref 101–111)
Creatinine, Ser: 13.94 mg/dL — ABNORMAL HIGH (ref 0.44–1.00)
GFR calc Af Amer: 3 mL/min — ABNORMAL LOW (ref >60)
GFR calc non Af Amer: 2 mL/min — ABNORMAL LOW (ref >60)
Glucose, Bld: 151 mg/dL — ABNORMAL HIGH (ref 65–99)
Potassium: 5.6 mmol/L — ABNORMAL HIGH (ref 3.5–5.1)
Sodium: 137 mmol/L (ref 135–145)

## 2015-02-22 MED ORDER — NEPRO/CARBSTEADY PO LIQD
237.0000 mL | ORAL | Status: DC | PRN
  Filled 2015-02-22: qty 237

## 2015-02-22 MED ORDER — SODIUM CHLORIDE 0.9 % IV SOLN: 100.0000 mL | INTRAVENOUS | Status: DC | PRN

## 2015-02-22 MED ORDER — HEPARIN SODIUM (PORCINE) 1000 UNIT/ML DIALYSIS
1000.0000 [IU] | INTRAMUSCULAR | Status: DC | PRN
  Filled 2015-02-22: qty 1

## 2015-02-22 MED ORDER — LIDOCAINE HCL (PF) 1 % IJ SOLN: 5.0000 mL | INTRAMUSCULAR | Status: DC | PRN

## 2015-02-22 MED ORDER — PENTAFLUOROPROP-TETRAFLUOROETH EX AERO
1.0000 "application " | INHALATION_SPRAY | CUTANEOUS | Status: DC | PRN
  Filled 2015-02-22: qty 30

## 2015-02-22 MED ORDER — HEPARIN SODIUM (PORCINE) 1000 UNIT/ML DIALYSIS
20.0000 [IU]/kg | Freq: Once | INTRAMUSCULAR | Status: DC
Start: 2015-02-23 — End: 2015-02-23
  Filled 2015-02-22: qty 3

## 2015-02-22 MED ORDER — ALTEPLASE 2 MG IJ SOLR
2.0000 mg | Freq: Once | INTRAMUSCULAR | Status: AC | PRN
Start: 2015-02-22 — End: 2015-02-22
  Filled 2015-02-22: qty 2

## 2015-02-22 MED ORDER — HYDROMORPHONE HCL 1 MG/ML IJ SOLN
1.0000 mg | Freq: Once | INTRAMUSCULAR | Status: AC
  Administered 2015-02-22: 1 mg via INTRAMUSCULAR
  Filled 2015-02-22: qty 1

## 2015-02-22 MED ORDER — LIDOCAINE-PRILOCAINE 2.5-2.5 % EX CREA
1.0000 | TOPICAL_CREAM | CUTANEOUS | Status: DC | PRN
Start: 2015-02-22 — End: 2015-02-23
  Filled 2015-02-22: qty 5

## 2015-02-22 NOTE — ED Notes (Signed)
Dialysis called for pt to establish a timeframe.

## 2015-02-22 NOTE — ED Provider Notes (Signed)
CSN: [id number]     Arrival date & time 02/22/15  1313 History   First MD Initiated Contact with Patient 02/22/15 1559     Chief Complaint  Patient presents with  . Labs Only     (Consider location/radiation/quality/duration/timing/severity/associated sxs/prior Treatment) HPI    64 year old female presenting for dialysis. End-stage renal disease. Last dialyzed 2 days ago. Currently does not have a dialysis center reports that she is told to return to the ED. She has no acute complaints other than right finger pain. Recently evaluated the same has fracture. She denies any interim injury since that evaluation. She is requesting another finger splint. Past Medical History  Diagnosis Date  . Hypertension   . HIV infection   . Morbid obesity   . ESRD (end stage renal disease) on dialysis 09/30/2013    [place] dialysis in [place], [place] around 2009.  ESRD due to HTN vs drug abuse according to pt.  [facility] until Feb 2015 when she was admitted to a SNF due to homelessness and drug abuse.  Then [name] on T[facility] schedule.     . Anginal pain   . CHF (congestive heart failure)   . Shortness of breath   . Pneumonia   . Depression   . Anxiety   . GERD (gastroesophageal reflux disease)   . Arthritis   . Anemia   . Hyperkalemia 08/11/2014   Past Surgical History  Procedure Laterality Date  . Arteriovenous graft placement      left forearm  . Cardiac catheterization    . Laparotomy      states seh was cut open because seh was bleeding on the inside   Family History  Problem Relation Age of Onset  . Kidney failure Other     niece  . High blood pressure    . Lung cancer    . Breast cancer Neg Hx   . Colon cancer Neg Hx   . Stroke Mother   . HIV/AIDS Brother     died of AIDS   History  Substance Use Topics  . Smoking status: Current Every Day Smoker -- 0.50 packs/day    Types: Cigarettes    Start date: 05/06/2014  . Smokeless tobacco: Never Used      Comment: recently quit  . Alcohol Use: No   OB History    No data available     Review of Systems  All systems reviewed and negative, other than as noted in HPI.   Allergies  Trazodone and nefazodone and Morphine and related  Home Medications   Prior to Admission medications   Medication Sig Start Date End Date Taking? Authorizing Provider  albuterol (PROVENTIL HFA;VENTOLIN HFA) 108 (90 BASE) MCG/ACT inhaler Inhale 2 puffs into the lungs every 6 (six) hours as needed for wheezing or shortness of breath.    Historical Provider, MD  amLODipine (NORVASC) 5 MG tablet Take 10 mg by mouth at bedtime.     Historical Provider, MD  amoxicillin-clavulanate (AUGMENTIN) 875-125 MG per tablet Take 1 tablet by mouth 2 (two) times daily. One po bid x 7 days Patient not taking: Reported on 02/19/2015 02/18/15   Felicie Mornavid Smith, NP  calcitRIOL (ROCALTROL) 0.5 MCG capsule Take 2 mcg by mouth daily. Takes on Mon, Wed, and Fri with Dialysis    Historical Provider, MD  cinacalcet (SENSIPAR) 90 MG tablet Take 90 mg by mouth daily.    Historical Provider, MD  colchicine 0.6 MG  tablet Take 0.6 mg by mouth daily.    Historical Provider, MD  diphenhydrAMINE (BENADRYL) 25 mg capsule Take 50 mg by mouth at bedtime as needed for sleep.    Historical Provider, MD  FLUoxetine (PROZAC) 20 MG capsule Take 20 mg by mouth daily.    Historical Provider, MD  folic acid-vitamin b complex-vitamin c-selenium-zinc (DIALYVITE) 3 MG TABS tablet Take 1 tablet by mouth daily.    Historical Provider, MD  hydrALAZINE (APRESOLINE) 25 MG tablet Take 25 mg by mouth every 8 (eight) hours.    Historical Provider, MD  HYDROmorphone (DILAUDID) 4 MG tablet Take 0.5 tablets (2 mg total) by mouth 2 (two) times daily as needed for severe pain. Patient not taking: Reported on 02/19/2015 07/04/14   Tomasita Crumble, MD  isosorbide mononitrate (IMDUR) 30 MG 24 hr tablet Take 30 mg by mouth daily.    Historical Provider, MD  lamiVUDine (EPIVIR) 10  MG/ML solution Take 2.5 mLs (25 mg total) by mouth daily. Patient not taking: Reported on 02/19/2015 08/14/14   Erasmo Downer, MD  lamivudine (EPIVIR-HBV) 5 MG/ML solution Take 5 mLs (25 mg total) by mouth daily. 07/14/14   Gardiner Barefoot, MD  lidocaine (LIDODERM) 5 % Place 1 patch onto the skin daily. Remove & Discard patch within 12 hours or as directed by MD Patient not taking: Reported on 02/19/2015 08/14/14   Erasmo Downer, MD  LORazepam (ATIVAN) 0.5 MG tablet Take 0.5 mg by mouth every 12 (twelve) hours as needed for anxiety.    Historical Provider, MD  omeprazole (PRILOSEC) 20 MG capsule Take 20 mg by mouth daily.    Historical Provider, MD  oxyCODONE-acetaminophen (PERCOCET/ROXICET) 5-325 MG per tablet Take 1 tablet by mouth every 8 (eight) hours as needed for severe pain. 02/18/15   Felicie Morn, NP  polyethylene glycol [facility] / Ethelene Hal) packet Take 17 g by mouth daily as needed for mild constipation.    Historical Provider, MD  raltegravir (ISENTRESS) 400 MG tablet Take 1 tablet (400 mg total) by mouth 2 (two) times daily. 07/14/14   Gardiner Barefoot, MD  raltegravir (ISENTRESS) 400 MG tablet Take 1 tablet (400 mg total) by mouth 2 (two) times daily. Patient not taking: Reported on 02/19/2015 08/14/14   Erasmo Downer, MD  sevelamer carbonate (RENVELA) 800 MG tablet Take 2,400 mg by mouth 3 (three) times daily with meals.    Historical Provider, MD  sevelamer carbonate (RENVELA) 800 MG tablet Take 3 tablets (2,400 mg total) by mouth 3 (three) times daily with meals. 08/14/14   Erasmo Downer, MD  tenofovir (VIREAD) 300 MG tablet Take 1 tablet (300 mg total) by mouth once a week. Give on Friday 07/14/14   Gardiner Barefoot, MD  tenofovir (VIREAD) 300 MG tablet Take 1 tablet (300 mg total) by mouth every Friday. Patient not taking: Reported on 02/19/2015 08/14/14   Erasmo Downer, MD  tiotropium (SPIRIVA) 18 MCG inhalation capsule Place 18 mcg into inhaler and inhale daily.     Historical Provider, MD   BP 147/84 mmHg  Pulse 85  Temp(Src) 98.5 F (36.9 C) (Oral)  Resp 14  SpO2 100% Physical Exam  Constitutional: She appears well-developed and well-nourished. No distress.  Laying in bed. No acute distress. Obese.  HENT:  Head: Normocephalic and atraumatic.  Eyes: Conjunctivae are normal. Right eye exhibits no discharge. Left eye exhibits no discharge.  Neck: Neck supple.  Cardiovascular: Normal rate, regular rhythm and normal heart sounds.  Exam  reveals no gallop and no friction rub.   No murmur heard. AV fistula left upper extremity with palpable thrill  Pulmonary/Chest: Effort normal and breath sounds normal. No respiratory distress.  Abdominal: Soft. She exhibits no distension. There is no tenderness.  Musculoskeletal: She exhibits no edema or tenderness.  Mild swelling to distal right fourth finger. Tenderness to palpation. General appearance is consistent with what I would expect with her recent injury. No signs of cellulitis or other infection.  Neurological: She is alert.  Skin: Skin is warm and dry.  Psychiatric: She has a normal mood and affect. Her behavior is normal. Thought content normal.  Nursing note and vitals reviewed.   ED Course  Procedures (including critical care time) Labs Review Labs Reviewed  CBC - Abnormal; Notable for the following:    RBC 2.95 (*)    Hemoglobin 10.4 (*)    HCT 32.4 (*)    MCV 109.8 (*)    MCH 35.3 (*)    All other components within normal limits  BASIC METABOLIC PANEL - Abnormal; Notable for the following:    Potassium 5.6 (*)    CO2 20 (*)    Glucose, Bld 151 (*)    BUN 92 (*)    Creatinine, Ser 13.94 (*)    Calcium 7.0 (*)    GFR calc non Af Amer 2 (*)    GFR calc Af Amer 3 (*)    Anion gap 16 (*)    All other components within normal limits    Imaging Review No results found.   EKG Interpretation   Date/Time:  Monday Feb 22 2015 16:45:11 EDT Ventricular Rate:  84 PR Interval:  189 QRS  Duration: 111 QT Interval:  426 QTC Calculation: 504 R Axis:   55 Text Interpretation:  Sinus rhythm No significant change since last  tracing Confirmed by Darryl Willner  MD, Dalaina Tates ([id number]) on 02/22/2015 5:06:41 PM      MDM   Final diagnoses:  ESRD (end stage renal disease) on dialysis    63yF with ESRD presenting for dialysis. Discussed with nephrology who are familiar with her. Mild hyperkalemia w/o acute EKG changes. Also c/o finger pain. Recently seen for same and known fx. Denies interim injury.     Raeford RazorStephen Alexandera Kuntzman, MD 02/24/15 [phone]

## 2015-02-22 NOTE — ED Notes (Addendum)
Cameilla, Case manager informed of situation. She states she will have social work to come see her. Pt has requested an unlimited cab voucher in the past and was denied.

## 2015-02-22 NOTE — Progress Notes (Signed)
CSW can provide pt with a bus pass, should pt need transportation assistance, but we are unable to provide her with taxi voucher.

## 2015-02-22 NOTE — ED Notes (Signed)
Pt states when this RN walks into room, "I'm just here for dialysis, and I need a case manager and a way out of here".

## 2015-02-22 NOTE — ED Notes (Signed)
Pt given a bus pass by social work. If pt will be here past 11 call Jody at [phone] in order to make different arrangements for transport home.

## 2015-02-22 NOTE — ED Notes (Signed)
Pt states she is here because she needs dialysis. She denies any physical complaints. She states she does not have dialysis center here and is working with a Child psychotherapistsocial worker to get a dialysis center.

## 2015-02-24 ENCOUNTER — Encounter (HOSPITAL_COMMUNITY): Payer: Self-pay | Admitting: Emergency Medicine

## 2015-02-24 ENCOUNTER — Emergency Department (HOSPITAL_COMMUNITY)
Admission: EM | Admit: 2015-02-24 | Discharge: 2015-02-25 | Disposition: A | Discharge status: Home / Self Care | Payer: Medicare Other | Source: Home / Self Care | Attending: Emergency Medicine | Admitting: Emergency Medicine

## 2015-02-24 ENCOUNTER — Encounter (HOSPITAL_COMMUNITY): Payer: Self-pay | Admitting: *Deleted

## 2015-02-24 ENCOUNTER — Non-Acute Institutional Stay (HOSPITAL_COMMUNITY)
Admission: EM | Admit: 2015-02-24 | Discharge: 2015-02-24 | Disposition: A | Discharge status: Home / Self Care | Payer: Medicare Other | Attending: Emergency Medicine | Admitting: Emergency Medicine

## 2015-02-24 DIAGNOSIS — Z8701 Personal history of pneumonia (recurrent): Secondary | ICD-10-CM

## 2015-02-24 DIAGNOSIS — Z9889 Other specified postprocedural states: Secondary | ICD-10-CM

## 2015-02-24 DIAGNOSIS — Z992 Dependence on renal dialysis: Secondary | ICD-10-CM | POA: Insufficient documentation

## 2015-02-24 DIAGNOSIS — I209 Angina pectoris, unspecified: Secondary | ICD-10-CM | POA: Insufficient documentation

## 2015-02-24 DIAGNOSIS — Z21 Asymptomatic human immunodeficiency virus [HIV] infection status: Secondary | ICD-10-CM | POA: Insufficient documentation

## 2015-02-24 DIAGNOSIS — F17210 Nicotine dependence, cigarettes, uncomplicated: Secondary | ICD-10-CM | POA: Insufficient documentation

## 2015-02-24 DIAGNOSIS — D649 Anemia, unspecified: Secondary | ICD-10-CM | POA: Insufficient documentation

## 2015-02-24 DIAGNOSIS — S66901D Unspecified injury of unspecified muscle, fascia and tendon at wrist and hand level, right hand, subsequent encounter: Secondary | ICD-10-CM

## 2015-02-24 DIAGNOSIS — I509 Heart failure, unspecified: Secondary | ICD-10-CM

## 2015-02-24 DIAGNOSIS — Z7951 Long term (current) use of inhaled steroids: Secondary | ICD-10-CM

## 2015-02-24 DIAGNOSIS — F419 Anxiety disorder, unspecified: Secondary | ICD-10-CM | POA: Insufficient documentation

## 2015-02-24 DIAGNOSIS — Z8739 Personal history of other diseases of the musculoskeletal system and connective tissue: Secondary | ICD-10-CM | POA: Insufficient documentation

## 2015-02-24 DIAGNOSIS — F329 Major depressive disorder, single episode, unspecified: Secondary | ICD-10-CM | POA: Insufficient documentation

## 2015-02-24 DIAGNOSIS — Z79899 Other long term (current) drug therapy: Secondary | ICD-10-CM | POA: Insufficient documentation

## 2015-02-24 DIAGNOSIS — N186 End stage renal disease: Secondary | ICD-10-CM

## 2015-02-24 DIAGNOSIS — K219 Gastro-esophageal reflux disease without esophagitis: Secondary | ICD-10-CM

## 2015-02-24 DIAGNOSIS — Z720 Tobacco use: Secondary | ICD-10-CM

## 2015-02-24 DIAGNOSIS — I120 Hypertensive chronic kidney disease with stage 5 chronic kidney disease or end stage renal disease: Secondary | ICD-10-CM | POA: Diagnosis present

## 2015-02-24 LAB — CBC
HEMATOCRIT: 31.5 % — AB (ref 36.0–46.0)
HEMOGLOBIN: 10.1 g/dL — AB (ref 12.0–15.0)
MCH: 35.7 pg — AB (ref 26.0–34.0)
MCHC: 32.1 g/dL (ref 30.0–36.0)
MCV: 111.3 fL — AB (ref 78.0–100.0)
Platelets: 236 10*3/uL (ref 150–400)
RBC: 2.83 MIL/uL — AB (ref 3.87–5.11)
RDW: 14.2 % (ref 11.5–15.5)
WBC: 9.1 10*3/uL (ref 4.0–10.5)

## 2015-02-24 LAB — I-STAT CHEM 8, ED
BUN: 53 mg/dL — ABNORMAL HIGH (ref 6–20)
CALCIUM ION: 0.89 mmol/L — AB (ref 1.13–1.30)
CHLORIDE: 104 mmol/L (ref 101–111)
Creatinine, Ser: 9.5 mg/dL — ABNORMAL HIGH (ref 0.44–1.00)
Glucose, Bld: 102 mg/dL — ABNORMAL HIGH (ref 65–99)
HEMATOCRIT: 35 % — AB (ref 36.0–46.0)
Hemoglobin: 11.9 g/dL — ABNORMAL LOW (ref 12.0–15.0)
POTASSIUM: 5.1 mmol/L (ref 3.5–5.1)
SODIUM: 140 mmol/L (ref 135–145)
TCO2: 21 mmol/L (ref 0–100)

## 2015-02-24 MED ORDER — SODIUM CHLORIDE 0.9 % IV SOLN: 100.0000 mL | INTRAVENOUS | Status: AC | PRN

## 2015-02-24 MED ORDER — LIDOCAINE HCL (PF) 1 % IJ SOLN: 5.0000 mL | INTRAMUSCULAR | Status: DC | PRN

## 2015-02-24 MED ORDER — NEPRO/CARBSTEADY PO LIQD: 237.0000 mL | ORAL | Status: DC | PRN

## 2015-02-24 MED ORDER — ALTEPLASE 2 MG IJ SOLR: 2.0000 mg | Freq: Once | INTRAMUSCULAR | Status: AC | PRN

## 2015-02-24 MED ORDER — PENTAFLUOROPROP-TETRAFLUOROETH EX AERO
INHALATION_SPRAY | CUTANEOUS | Status: AC
  Filled 2015-02-24: qty 103.5

## 2015-02-24 MED ORDER — HEPARIN SODIUM (PORCINE) 1000 UNIT/ML DIALYSIS: 20.0000 [IU]/kg | Freq: Once | INTRAMUSCULAR | Status: AC

## 2015-02-24 MED ORDER — HEPARIN SODIUM (PORCINE) 1000 UNIT/ML DIALYSIS: 1000.0000 [IU] | INTRAMUSCULAR | Status: DC | PRN

## 2015-02-24 MED ORDER — PENTAFLUOROPROP-TETRAFLUOROETH EX AERO
1.0000 "application " | INHALATION_SPRAY | CUTANEOUS | Status: DC | PRN
  Administered 2015-02-24: 1 via TOPICAL

## 2015-02-24 MED ORDER — OXYCODONE-ACETAMINOPHEN 5-325 MG PO TABS
1.0000 | ORAL_TABLET | Freq: Once | ORAL | Status: AC
  Administered 2015-02-24: 1 via ORAL
  Filled 2015-02-24: qty 1

## 2015-02-24 MED ORDER — SODIUM CHLORIDE 0.9 % IV SOLN
100.0000 mL | INTRAVENOUS | Status: AC | PRN
Start: 2015-02-24 — End: ?

## 2015-02-24 MED ORDER — OXYCODONE-ACETAMINOPHEN 5-325 MG PO TABS: 1.0000 | ORAL_TABLET | Freq: Four times a day (QID) | ORAL | Status: DC | PRN

## 2015-02-24 MED ORDER — LIDOCAINE-PRILOCAINE 2.5-2.5 % EX CREA: 1.0000 "application " | TOPICAL_CREAM | CUTANEOUS | Status: DC | PRN

## 2015-02-24 NOTE — ED Notes (Signed)
Pt agreed to full dialysis Tx.

## 2015-02-24 NOTE — Progress Notes (Signed)
Patient from ED. Has no HD unit. Alert, oriented x4, vss. Cannulated with 15g buttonhole needles with no complications. 4 hour HD tx with 4.5 L fluid removal ordered. Patient to be discharged post treatment.

## 2015-02-24 NOTE — ED Notes (Signed)
Ordered pt a breakfast tray.

## 2015-02-24 NOTE — ED Provider Notes (Addendum)
CSN: [id number]     Arrival date & time 02/24/15  [id number] History   First MD Initiated Contact with Patient 02/24/15 [phone]     Chief Complaint  Patient presents with  . Vascular Access Problem    NEED DIALYSIS     (Consider location/radiation/quality/duration/timing/severity/associated sxs/prior Treatment) HPI Comments: Patient with a history of hypertension, end-stage renal disease on dialysis, CHF, HIV presenting today for routine dialysis. Currently patient does not have a dialysis center and presents to the emergency room 3 days a week for her routine dialysis. Last dialysis was Monday. She currently has no complaints of shortness of breath or chest pain. She is taking her medications as prescribed. She is to meet with social worker tomorrow in a car going to attempt to arrange low income housing, dialysis center and transportation to and from dialysis.  Secondly patient fractured her fourth finger 1 week ago and is requesting bandaged changed. She still complains of having pain in the finger  The history is provided by the patient.    Past Medical History  Diagnosis Date  . Hypertension   . HIV infection   . Morbid obesity   . ESRD (end stage renal disease) on dialysis 09/30/2013    [place] dialysis in [place], [place] around 2009.  ESRD due to HTN vs drug abuse according to pt.  [facility] dialysis [facility] until Feb 2015 when she was admitted to a SNF due to homelessness and drug abuse.  Then changed to [facility] on TTS schedule.     . Anginal pain   . CHF (congestive heart failure)   . Shortness of breath   . Pneumonia   . Depression   . Anxiety   . GERD (gastroesophageal reflux disease)   . Arthritis   . Anemia   . Hyperkalemia 08/11/2014   Past Surgical History  Procedure Laterality Date  . Arteriovenous graft placement      left forearm  . Cardiac catheterization    . Laparotomy      states seh was cut open because seh was bleeding on the inside   Family History    Problem Relation Age of Onset  . Kidney failure Other     niece  . High blood pressure    . Lung cancer    . Breast cancer Neg Hx   . Colon cancer Neg Hx   . Stroke Mother   . HIV/AIDS Brother     died of AIDS   History  Substance Use Topics  . Smoking status: Current Every Day Smoker -- 0.50 packs/day    Types: Cigarettes    Start date: 05/06/2014  . Smokeless tobacco: Never Used     Comment: recently quit  . Alcohol Use: No   OB History    No data available     Review of Systems  All other systems reviewed and are negative.     Allergies  Trazodone and nefazodone and Morphine and related  Home Medications   Prior to Admission medications   Medication Sig Start Date End Date Taking? Authorizing Provider  albuterol (PROVENTIL HFA;VENTOLIN HFA) 108 (90 BASE) MCG/ACT inhaler Inhale 2 puffs into the lungs every 6 (six) hours as needed for wheezing or shortness of breath.    Historical Provider, MD  amLODipine (NORVASC) 5 MG tablet Take 10 mg by mouth at bedtime.     Historical Provider, MD  amoxicillin-clavulanate (AUGMENTIN) 875-125 MG per tablet Take 1 tablet by mouth 2 (two)  times daily. One po bid x 7 days Patient not taking: Reported on 02/19/2015 02/18/15   Felicie Mornavid Smith, NP  calcitRIOL (ROCALTROL) 0.5 MCG capsule Take 2 mcg by mouth daily. Takes on Mon, Wed, and Fri with Dialysis    Historical Provider, MD  cinacalcet (SENSIPAR) 90 MG tablet Take 90 mg by mouth daily.    Historical Provider, MD  colchicine 0.6 MG tablet Take 0.6 mg by mouth daily.    Historical Provider, MD  diphenhydrAMINE (BENADRYL) 25 mg capsule Take 50 mg by mouth at bedtime as needed for sleep.    Historical Provider, MD  FLUoxetine (PROZAC) 20 MG capsule Take 20 mg by mouth daily.    Historical Provider, MD  folic acid-vitamin b complex-vitamin c-selenium-zinc (DIALYVITE) 3 MG TABS tablet Take 1 tablet by mouth daily.    Historical Provider, MD  hydrALAZINE (APRESOLINE) 25 MG tablet Take 25 mg  by mouth every 8 (eight) hours.    Historical Provider, MD  HYDROmorphone (DILAUDID) 4 MG tablet Take 0.5 tablets (2 mg total) by mouth 2 (two) times daily as needed for severe pain. Patient not taking: Reported on 02/19/2015 07/04/14   Tomasita CrumbleAdeleke Oni, MD  isosorbide mononitrate (IMDUR) 30 MG 24 hr tablet Take 30 mg by mouth daily.    Historical Provider, MD  lamiVUDine (EPIVIR) 10 MG/ML solution Take 2.5 mLs (25 mg total) by mouth daily. Patient not taking: Reported on 02/19/2015 08/14/14   Erasmo DownerAngela M Bacigalupo, MD  lamivudine (EPIVIR-HBV) 5 MG/ML solution Take 5 mLs (25 mg total) by mouth daily. 07/14/14   Gardiner Barefootobert W Comer, MD  lidocaine (LIDODERM) 5 % Place 1 patch onto the skin daily. Remove & Discard patch within 12 hours or as directed by MD Patient not taking: Reported on 02/19/2015 08/14/14   Erasmo DownerAngela M Bacigalupo, MD  LORazepam (ATIVAN) 0.5 MG tablet Take 0.5 mg by mouth every 12 (twelve) hours as needed for anxiety.    Historical Provider, MD  omeprazole (PRILOSEC) 20 MG capsule Take 20 mg by mouth daily.    Historical Provider, MD  oxyCODONE-acetaminophen (PERCOCET/ROXICET) 5-325 MG per tablet Take 1 tablet by mouth every 8 (eight) hours as needed for severe pain. 02/18/15   Felicie Mornavid Smith, NP  polyethylene glycol [facility]ALAX / Ethelene HalGLYCOLAX) packet Take 17 g by mouth daily as needed for mild constipation.    Historical Provider, MD  raltegravir (ISENTRESS) 400 MG tablet Take 1 tablet (400 mg total) by mouth 2 (two) times daily. 07/14/14   Gardiner Barefootobert W Comer, MD  raltegravir (ISENTRESS) 400 MG tablet Take 1 tablet (400 mg total) by mouth 2 (two) times daily. Patient not taking: Reported on 02/19/2015 08/14/14   Erasmo DownerAngela M Bacigalupo, MD  sevelamer carbonate (RENVELA) 800 MG tablet Take 2,400 mg by mouth 3 (three) times daily with meals.    Historical Provider, MD  sevelamer carbonate (RENVELA) 800 MG tablet Take 3 tablets (2,400 mg total) by mouth 3 (three) times daily with meals. 08/14/14   Erasmo DownerAngela M Bacigalupo, MD   tenofovir (VIREAD) 300 MG tablet Take 1 tablet (300 mg total) by mouth once a week. Give on Friday 07/14/14   Gardiner Barefootobert W Comer, MD  tenofovir (VIREAD) 300 MG tablet Take 1 tablet (300 mg total) by mouth every Friday. Patient not taking: Reported on 02/19/2015 08/14/14   Erasmo DownerAngela M Bacigalupo, MD  tiotropium (SPIRIVA) 18 MCG inhalation capsule Place 18 mcg into inhaler and inhale daily.    Historical Provider, MD   BP 180/91 mmHg  Pulse 85  Temp(Src)  97.5 F (36.4 C) (Oral)  Resp 14  Ht  (1.626 m)  Wt 254 lb (115.214 kg)  BMI 43.58 kg/m2  SpO2 100% Physical Exam  Constitutional: She is oriented to person, place, and time. She appears well-developed and well-nourished. No distress.  HENT:  Head: Normocephalic and atraumatic.  Mouth/Throat: Oropharynx is clear and moist.  Eyes: Conjunctivae and EOM are normal. Pupils are equal, round, and reactive to light.  Neck: Normal range of motion. Neck supple.  Cardiovascular: Normal rate, regular rhythm and intact distal pulses.   No murmur heard. Pulmonary/Chest: Effort normal and breath sounds normal. No respiratory distress. She has no wheezes. She has no rales.  Abdominal: Soft. She exhibits no distension. There is no tenderness. There is no rebound and no guarding.  Musculoskeletal: Normal range of motion. She exhibits tenderness. She exhibits no edema.       Hands: Neurological: She is alert and oriented to person, place, and time.  Skin: Skin is warm and dry. No rash noted. No erythema.  Psychiatric: She has a normal mood and affect. Her behavior is normal.  Nursing note and vitals reviewed.   ED Course  Procedures (including critical care time) Labs Review Labs Reviewed  I-STAT CHEM 8, ED - Abnormal; Notable for the following:    BUN 53 (*)    Creatinine, Ser 9.50 (*)    Glucose, Bld 102 (*)    Calcium, Ion 0.89 (*)    Hemoglobin 11.9 (*)    HCT 35.0 (*)    All other components within normal limits    Imaging Review No  results found.   EKG Interpretation   Date/Time:  Wednesday Feb 24 2015 07:43:59 EDT Ventricular Rate:  82 PR Interval:  184 QRS Duration: 110 QT Interval:  418 QTC Calculation: 488 R Axis:   28 Text Interpretation:  Sinus rhythm Incomplete left bundle branch block  Minimal ST elevation, inferior leads Borderline prolonged QT interval No  significant change since last tracing Confirmed by Anitra Lauth  MD, Alphonzo Lemmings  [id number]) on 02/24/2015 7:51:18 AM      MDM   Final diagnoses:  ESRD (end stage renal disease)  Injury of extensor tendon of hand, right, subsequent encounter    Pt presenting for dialysis.  Last treatment was Monday a full course.  Pt currently working with social work to get a regular dialysis center and transport.  She meets with Child psychotherapist tomorrow.  However until then pt presents to the ED for dialysis.  Pt denies CP or SOB.  She was seen 1 week ago after fracturing her finger and request the injury be redressed today.  On evaluation pt has swelling to the finger with evidence of injury to the extensor tendon.  Unable to fully extend at the DIP joint of the 4th digit.  Will resplint and give pt f/u with hand for further management.  EKG unchanged today and i-stat chem 8 pending.  HD stable today but hypertensive.  I-stat with normal potassium.  Will call nephrology.    Gwyneth Sprout, MD 02/24/15 [id number]  Gwyneth Sprout, MD 02/24/15 5621  Gwyneth Sprout, MD 02/24/15 [phone]

## 2015-02-24 NOTE — ED Notes (Signed)
PT ARRIVED FROM HOME VIA [facility]. PT RECENTLY MOVED TO [place] AND DO NOT HAVE A DIALYSIS CENTER YET. PT STATES THAT SHE HAS BEEN RECEIVING DIALYSIS HERE FOR THE PAST MONTH. LAST DIALYSIS WAS ON Monday.

## 2015-02-24 NOTE — ED Notes (Addendum)
Pt presents to ED via EMS needing dialysis, states she does dialysis through ED. Last dialysis on Monday. Pt states moves to the area recently. No other complaints.

## 2015-02-24 NOTE — ED Provider Notes (Signed)
CSN: [id number]     Arrival date & time 02/24/15  1913 History   First MD Initiated Contact with Patient 02/24/15 1918     Chief Complaint  Patient presents with  . Vascular Access Problem     (Consider location/radiation/quality/duration/timing/severity/associated sxs/prior Treatment) HPI [name] is a 64 y.o female with a history of ESRD, who was last dialyzed on Monday. She gets dialysis on M,W,F. She has no acute complaints except that she needs dialysis. She says she was seen this morning in the ED requesting dialysis but had to leave because her daughter called. She does not have a dialysis center that she routinely goes to but is in the process of getting on.  Past Medical History  Diagnosis Date  . Hypertension   . HIV infection   . Morbid obesity   . ESRD (end stage renal disease) on dialysis 09/30/2013    [place] in [place], [place] around 2009.  ESRD due to HTN vs drug abuse according to pt.  [facility] until Feb 2015 when she was admitted to a SNF due to homelessness and drug abuse.  Then [facility] on [facility] schedule.     . Anginal pain   . CHF (congestive heart failure)   . Shortness of breath   . Pneumonia   . Depression   . Anxiety   . GERD (gastroesophageal reflux disease)   . Arthritis   . Anemia   . Hyperkalemia 08/11/2014   Past Surgical History  Procedure Laterality Date  . Arteriovenous graft placement      left forearm  . Cardiac catheterization    . Laparotomy      states seh was cut open because seh was bleeding on the inside   Family History  Problem Relation Age of Onset  . Kidney failure Other     niece  . High blood pressure    . Lung cancer    . Breast cancer Neg Hx   . Colon cancer Neg Hx   . Stroke Mother   . HIV/AIDS Brother     died of AIDS   History  Substance Use Topics  . Smoking status: Current Every Day Smoker -- 0.50 packs/day    Types: Cigarettes    Start date: 05/06/2014  . Smokeless  tobacco: Never Used     Comment: recently quit  . Alcohol Use: No   OB History    No data available     Review of Systems  Constitutional: Negative for fever and chills.  Respiratory: Negative for shortness of breath.   Cardiovascular: Negative for chest pain and leg swelling.  Gastrointestinal: Negative for nausea, vomiting and abdominal pain.  Neurological: Negative for dizziness.  All other systems reviewed and are negative.     Allergies  Trazodone and nefazodone and Morphine and related  Home Medications   Prior to Admission medications   Medication Sig Start Date End Date Taking? Authorizing Provider  albuterol (PROVENTIL HFA;VENTOLIN HFA) 108 (90 BASE) MCG/ACT inhaler Inhale 2 puffs into the lungs every 6 (six) hours as needed for wheezing or shortness of breath.    Historical Provider, MD  amLODipine (NORVASC) 5 MG tablet Take 10 mg by mouth at bedtime.     Historical Provider, MD  amoxicillin-clavulanate (AUGMENTIN) 875-125 MG per tablet Take 1 tablet by mouth 2 (two) times daily. One po bid x 7 days Patient not taking: Reported on 02/19/2015 02/18/15   Felicie Mornavid Smith, NP  calcitRIOL (ROCALTROL) 0.5 MCG capsule Take 2 mcg by mouth daily. Takes on Mon, Wed, and Fri with Dialysis    Historical Provider, MD  cinacalcet (SENSIPAR) 90 MG tablet Take 90 mg by mouth daily.    Historical Provider, MD  colchicine 0.6 MG tablet Take 0.6 mg by mouth daily.    Historical Provider, MD  diphenhydrAMINE (BENADRYL) 25 mg capsule Take 50 mg by mouth at bedtime as needed for sleep.    Historical Provider, MD  FLUoxetine (PROZAC) 20 MG capsule Take 20 mg by mouth daily.    Historical Provider, MD  folic acid-vitamin b complex-vitamin c-selenium-zinc (DIALYVITE) 3 MG TABS tablet Take 1 tablet by mouth daily.    Historical Provider, MD  hydrALAZINE (APRESOLINE) 25 MG tablet Take 25 mg by mouth every 8 (eight) hours.    Historical Provider, MD  HYDROmorphone (DILAUDID) 4 MG tablet Take 0.5 tablets  (2 mg total) by mouth 2 (two) times daily as needed for severe pain. Patient not taking: Reported on 02/19/2015 07/04/14   Tomasita Crumble, MD  isosorbide mononitrate (IMDUR) 30 MG 24 hr tablet Take 30 mg by mouth daily.    Historical Provider, MD  lamiVUDine (EPIVIR) 10 MG/ML solution Take 2.5 mLs (25 mg total) by mouth daily. Patient not taking: Reported on 02/19/2015 08/14/14   Erasmo Downer, MD  lamivudine (EPIVIR-HBV) 5 MG/ML solution Take 5 mLs (25 mg total) by mouth daily. 07/14/14   Gardiner Barefoot, MD  lidocaine (LIDODERM) 5 % Place 1 patch onto the skin daily. Remove & Discard patch within 12 hours or as directed by MD Patient not taking: Reported on 02/19/2015 08/14/14   Erasmo Downer, MD  LORazepam (ATIVAN) 0.5 MG tablet Take 0.5 mg by mouth every 12 (twelve) hours as needed for anxiety.    Historical Provider, MD  omeprazole (PRILOSEC) 20 MG capsule Take 20 mg by mouth daily.    Historical Provider, MD  oxyCODONE-acetaminophen (PERCOCET/ROXICET) 5-325 MG per tablet Take 1-2 tablets by mouth every 6 (six) hours as needed for severe pain. 02/24/15   Gwyneth Sprout, MD  polyethylene glycol (MIRALAX / GLYCOLAX) packet Take 17 g by mouth daily as needed for mild constipation.    Historical Provider, MD  raltegravir (ISENTRESS) 400 MG tablet Take 1 tablet (400 mg total) by mouth 2 (two) times daily. 07/14/14   Gardiner Barefoot, MD  raltegravir (ISENTRESS) 400 MG tablet Take 1 tablet (400 mg total) by mouth 2 (two) times daily. Patient not taking: Reported on 02/19/2015 08/14/14   Erasmo Downer, MD  sevelamer carbonate (RENVELA) 800 MG tablet Take 2,400 mg by mouth 3 (three) times daily with meals.    Historical Provider, MD  sevelamer carbonate (RENVELA) 800 MG tablet Take 3 tablets (2,400 mg total) by mouth 3 (three) times daily with meals. 08/14/14   Erasmo Downer, MD  tenofovir (VIREAD) 300 MG tablet Take 1 tablet (300 mg total) by mouth once a week. Give on Friday 07/14/14   Gardiner Barefoot, MD  tenofovir (VIREAD) 300 MG tablet Take 1 tablet (300 mg total) by mouth every Friday. Patient not taking: Reported on 02/19/2015 08/14/14   Erasmo Downer, MD  tiotropium (SPIRIVA) 18 MCG inhalation capsule Place 18 mcg into inhaler and inhale daily.    Historical Provider, MD   BP 100/56 mmHg  Pulse 95  Temp(Src) 97.8 F (36.6 C) (Oral)  Resp 16  SpO2 98% Physical Exam  Constitutional: She is oriented to person, place,  and time. She appears well-developed and well-nourished.  HENT:  Head: Normocephalic and atraumatic.  Cardiovascular: Normal rate, regular rhythm and normal heart sounds.   Pulmonary/Chest: Effort normal. No respiratory distress. She has no wheezes. She has no rales.  Abdominal: Soft. There is no tenderness.  Musculoskeletal: Normal range of motion.  Neurological: She is alert and oriented to person, place, and time.  Skin: Skin is warm and dry.    ED Course  Procedures (including critical care time) Labs Review Labs Reviewed - No data to display  Imaging Review No results found.   EKG Interpretation None      MDM   Final diagnoses:  ESRD needing dialysis   Patient presents for dialysis.  Her last dialysis was on Monday.  She was seen in the ED this morning for the same but had to leave urgently due to a phone call.  She is asymptomatic now.  No fever, chest pain, shortness of breath, nausea, vomiting, or leg swelling. Her potassium this morning was 5.1 so I did not get a repeat chem8.   19:43 I spoke to Dr. Odis Lusterolaponato, from nephrology, who states she can get dialysis tonight but he will have to call in a nurse if she is willing to stay.  He states she left this morning.  The patient states she is willing to stay.   She will have dialysis tonight and be discharged.        Catha GosselinHanna Patel-Mills, PA-C 02/25/15 0100  Eber HongBrian Miller, MD 02/25/15 [phone]

## 2015-02-24 NOTE — ED Notes (Signed)
[facility]

## 2015-02-24 NOTE — ED Notes (Signed)
Dialysis called again states patient will b eup around 4pm.

## 2015-02-25 LAB — RENAL FUNCTION PANEL
ANION GAP: 15 (ref 5–15)
Albumin: 3.1 g/dL — ABNORMAL LOW (ref 3.5–5.0)
BUN: 70 mg/dL — ABNORMAL HIGH (ref 6–20)
CO2: 22 mmol/L (ref 22–32)
Calcium: 7.1 mg/dL — ABNORMAL LOW (ref 8.9–10.3)
Chloride: 102 mmol/L (ref 101–111)
Creatinine, Ser: 10.25 mg/dL — ABNORMAL HIGH (ref 0.44–1.00)
GFR calc Af Amer: 4 mL/min — ABNORMAL LOW (ref >60)
GFR calc non Af Amer: 4 mL/min — ABNORMAL LOW (ref >60)
Glucose, Bld: 149 mg/dL — ABNORMAL HIGH (ref 65–99)
PHOSPHORUS: 5.5 mg/dL — AB (ref 2.5–4.6)
Potassium: 5.4 mmol/L — ABNORMAL HIGH (ref 3.5–5.1)
Sodium: 139 mmol/L (ref 135–145)

## 2015-02-25 NOTE — ED Notes (Signed)
Patient discharged home from dialysis.

## 2015-02-25 NOTE — Progress Notes (Signed)
ED transported patient to their waiting room until EMS can take her home. Patient has been discharged.

## 2015-02-25 NOTE — Progress Notes (Signed)
[facility]. They cannot give a time estimate.

## 2015-02-25 NOTE — Progress Notes (Signed)
Patient completed HD with no problems. Discharge to home post tx via EMS. Pt alert, no c/o, vss.

## 2015-02-25 NOTE — Progress Notes (Signed)
[facility] said [facility] cannot give a time they will be here for patient. Patient will not call family for a ride. Said she does not have money to pay anyone for a ride so she has to wait for EMS. ED cannot allow her to wait for EMS in their waiting room.

## 2015-02-26 ENCOUNTER — Non-Acute Institutional Stay (HOSPITAL_COMMUNITY)
Admission: EM | Admit: 2015-02-26 | Discharge: 2015-02-26 | Disposition: A | Discharge status: Home / Self Care | Payer: Medicare Other | Attending: Emergency Medicine | Admitting: Emergency Medicine

## 2015-02-26 ENCOUNTER — Emergency Department (EMERGENCY_DEPARTMENT_HOSPITAL)
Admission: EM | Admit: 2015-02-26 | Discharge: 2015-02-27 | Disposition: A | Discharge status: Home / Self Care | Payer: Medicare Other | Source: Home / Self Care | Attending: Emergency Medicine | Admitting: Emergency Medicine

## 2015-02-26 ENCOUNTER — Other Ambulatory Visit: Payer: Self-pay

## 2015-02-26 ENCOUNTER — Encounter (HOSPITAL_COMMUNITY): Payer: Self-pay | Admitting: Emergency Medicine

## 2015-02-26 DIAGNOSIS — I120 Hypertensive chronic kidney disease with stage 5 chronic kidney disease or end stage renal disease: Secondary | ICD-10-CM | POA: Diagnosis not present

## 2015-02-26 DIAGNOSIS — R45851 Suicidal ideations: Secondary | ICD-10-CM

## 2015-02-26 DIAGNOSIS — M79644 Pain in right finger(s): Secondary | ICD-10-CM

## 2015-02-26 DIAGNOSIS — N186 End stage renal disease: Secondary | ICD-10-CM

## 2015-02-26 DIAGNOSIS — B20 Human immunodeficiency virus [HIV] disease: Secondary | ICD-10-CM

## 2015-02-26 DIAGNOSIS — Z21 Asymptomatic human immunodeficiency virus [HIV] infection status: Secondary | ICD-10-CM | POA: Insufficient documentation

## 2015-02-26 DIAGNOSIS — Z992 Dependence on renal dialysis: Secondary | ICD-10-CM | POA: Insufficient documentation

## 2015-02-26 DIAGNOSIS — F331 Major depressive disorder, recurrent, moderate: Secondary | ICD-10-CM

## 2015-02-26 DIAGNOSIS — E663 Overweight: Secondary | ICD-10-CM

## 2015-02-26 DIAGNOSIS — Z79899 Other long term (current) drug therapy: Secondary | ICD-10-CM | POA: Insufficient documentation

## 2015-02-26 DIAGNOSIS — Z5900 Homelessness unspecified: Secondary | ICD-10-CM

## 2015-02-26 DIAGNOSIS — I509 Heart failure, unspecified: Secondary | ICD-10-CM | POA: Insufficient documentation

## 2015-02-26 DIAGNOSIS — F17210 Nicotine dependence, cigarettes, uncomplicated: Secondary | ICD-10-CM | POA: Insufficient documentation

## 2015-02-26 DIAGNOSIS — F419 Anxiety disorder, unspecified: Secondary | ICD-10-CM | POA: Insufficient documentation

## 2015-02-26 DIAGNOSIS — Z720 Tobacco use: Secondary | ICD-10-CM

## 2015-02-26 LAB — CBC WITH DIFFERENTIAL/PLATELET
Basophils Absolute: 0 10*3/uL (ref 0.0–0.1)
Basophils Absolute: 0 10*3/uL (ref 0.0–0.1)
Basophils Relative: 0 % (ref 0–1)
Basophils Relative: 0 % (ref 0–1)
EOS ABS: 0.1 10*3/uL (ref 0.0–0.7)
EOS ABS: 0.1 10*3/uL (ref 0.0–0.7)
Eosinophils Relative: 1 % (ref 0–5)
Eosinophils Relative: 1 % (ref 0–5)
HEMATOCRIT: 34 % — AB (ref 36.0–46.0)
HEMATOCRIT: 36.5 % (ref 36.0–46.0)
Hemoglobin: 10.7 g/dL — ABNORMAL LOW (ref 12.0–15.0)
Hemoglobin: 11.5 g/dL — ABNORMAL LOW (ref 12.0–15.0)
LYMPHS ABS: 2.6 10*3/uL (ref 0.7–4.0)
LYMPHS ABS: 2.2 10*3/uL (ref 0.7–4.0)
Lymphocytes Relative: 32 % (ref 12–46)
Lymphocytes Relative: 27 % (ref 12–46)
MCH: 35.3 pg — ABNORMAL HIGH (ref 26.0–34.0)
MCH: 35.2 pg — ABNORMAL HIGH (ref 26.0–34.0)
MCHC: 31.5 g/dL (ref 30.0–36.0)
MCHC: 31.5 g/dL (ref 30.0–36.0)
MCV: 112.2 fL — AB (ref 78.0–100.0)
MCV: 111.6 fL — ABNORMAL HIGH (ref 78.0–100.0)
MONO ABS: 0.6 10*3/uL (ref 0.1–1.0)
Monocytes Absolute: 0.7 10*3/uL (ref 0.1–1.0)
Monocytes Relative: 8 % (ref 3–12)
Monocytes Relative: 8 % (ref 3–12)
NEUTROS ABS: 4.7 10*3/uL (ref 1.7–7.7)
Neutro Abs: 5.3 10*3/uL (ref 1.7–7.7)
Neutrophils Relative %: 59 % (ref 43–77)
Neutrophils Relative %: 64 % (ref 43–77)
Platelets: 228 10*3/uL (ref 150–400)
Platelets: 270 10*3/uL (ref 150–400)
RBC: 3.03 MIL/uL — ABNORMAL LOW (ref 3.87–5.11)
RBC: 3.27 MIL/uL — ABNORMAL LOW (ref 3.87–5.11)
RDW: 14.2 % (ref 11.5–15.5)
RDW: 14.1 % (ref 11.5–15.5)
WBC: 8 10*3/uL (ref 4.0–10.5)
WBC: 8.3 10*3/uL (ref 4.0–10.5)

## 2015-02-26 LAB — COMPREHENSIVE METABOLIC PANEL
ALBUMIN: 3.3 g/dL — AB (ref 3.5–5.0)
ALBUMIN: 3.4 g/dL — AB (ref 3.5–5.0)
ALK PHOS: 299 U/L — AB (ref 38–126)
ALK PHOS: 352 U/L — AB (ref 38–126)
ALT: 11 U/L — AB (ref 14–54)
ALT: 12 U/L — ABNORMAL LOW (ref 14–54)
ANION GAP: 13 (ref 5–15)
AST: 14 U/L — ABNORMAL LOW (ref 15–41)
AST: 16 U/L (ref 15–41)
Anion gap: 13 (ref 5–15)
BILIRUBIN TOTAL: 0.5 mg/dL (ref 0.3–1.2)
BUN: 50 mg/dL — ABNORMAL HIGH (ref 6–20)
BUN: 24 mg/dL — AB (ref 6–20)
CHLORIDE: 102 mmol/L (ref 101–111)
CO2: 24 mmol/L (ref 22–32)
CO2: 28 mmol/L (ref 22–32)
CREATININE: 8.74 mg/dL — AB (ref 0.44–1.00)
CREATININE: 4.95 mg/dL — AB (ref 0.44–1.00)
Calcium: 7.9 mg/dL — ABNORMAL LOW (ref 8.9–10.3)
Calcium: 8.3 mg/dL — ABNORMAL LOW (ref 8.9–10.3)
Chloride: 98 mmol/L — ABNORMAL LOW (ref 101–111)
GFR calc Af Amer: 5 mL/min — ABNORMAL LOW (ref >60)
GFR calc Af Amer: 10 mL/min — ABNORMAL LOW (ref >60)
GFR, EST NON AFRICAN AMERICAN: 4 mL/min — AB (ref >60)
GFR, EST NON AFRICAN AMERICAN: 9 mL/min — AB (ref >60)
GLUCOSE: 157 mg/dL — AB (ref 65–99)
Glucose, Bld: 118 mg/dL — ABNORMAL HIGH (ref 65–99)
Potassium: 5.4 mmol/L — ABNORMAL HIGH (ref 3.5–5.1)
Potassium: 4.1 mmol/L (ref 3.5–5.1)
Sodium: 139 mmol/L (ref 135–145)
Sodium: 139 mmol/L (ref 135–145)
TOTAL PROTEIN: 7.8 g/dL (ref 6.5–8.1)
Total Bilirubin: 0.4 mg/dL (ref 0.3–1.2)
Total Protein: 7.3 g/dL (ref 6.5–8.1)

## 2015-02-26 LAB — ETHANOL: Alcohol, Ethyl (B): <5 mg/dL (ref <5)

## 2015-02-26 LAB — ACETAMINOPHEN LEVEL: Acetaminophen (Tylenol), Serum: <10 ug/mL — ABNORMAL LOW (ref 10–30)

## 2015-02-26 LAB — SALICYLATE LEVEL

## 2015-02-26 MED ORDER — SODIUM CHLORIDE 0.9 % IV SOLN
100.0000 mL | INTRAVENOUS | Status: DC | PRN
Start: 2015-02-26 — End: 2015-02-26

## 2015-02-26 MED ORDER — LIDOCAINE-PRILOCAINE 2.5-2.5 % EX CREA: 1.0000 "application " | TOPICAL_CREAM | CUTANEOUS | Status: DC | PRN

## 2015-02-26 MED ORDER — ACETAMINOPHEN 325 MG PO TABS
650.0000 mg | ORAL_TABLET | Freq: Once | ORAL | Status: AC
  Administered 2015-02-27: 650 mg via ORAL
  Filled 2015-02-26: qty 2

## 2015-02-26 MED ORDER — NEPRO/CARBSTEADY PO LIQD
237.0000 mL | ORAL | Status: DC | PRN
  Filled 2015-02-26: qty 237

## 2015-02-26 MED ORDER — ALTEPLASE 2 MG IJ SOLR
2.0000 mg | Freq: Once | INTRAMUSCULAR | Status: DC | PRN
  Filled 2015-02-26: qty 2

## 2015-02-26 MED ORDER — SODIUM CHLORIDE 0.9 % IV SOLN: 100.0000 mL | INTRAVENOUS | Status: DC | PRN

## 2015-02-26 MED ORDER — HEPARIN SODIUM (PORCINE) 1000 UNIT/ML DIALYSIS
1000.0000 [IU] | INTRAMUSCULAR | Status: DC | PRN
  Filled 2015-02-26: qty 1

## 2015-02-26 MED ORDER — ACETAMINOPHEN 500 MG PO TABS: 500.0000 mg | ORAL_TABLET | Freq: Four times a day (QID) | ORAL | Status: DC | PRN

## 2015-02-26 MED ORDER — ZOLPIDEM TARTRATE 5 MG PO TABS: 5.0000 mg | ORAL_TABLET | Freq: Every evening | ORAL | Status: DC | PRN

## 2015-02-26 MED ORDER — PENTAFLUOROPROP-TETRAFLUOROETH EX AERO: 1.0000 "application " | INHALATION_SPRAY | CUTANEOUS | Status: DC | PRN

## 2015-02-26 MED ORDER — ONDANSETRON HCL 4 MG PO TABS: 4.0000 mg | ORAL_TABLET | Freq: Three times a day (TID) | ORAL | Status: DC | PRN

## 2015-02-26 MED ORDER — LIDOCAINE HCL (PF) 1 % IJ SOLN: 5.0000 mL | INTRAMUSCULAR | Status: DC | PRN

## 2015-02-26 MED ORDER — HEPARIN SODIUM (PORCINE) 1000 UNIT/ML DIALYSIS
20.0000 [IU]/kg | INTRAMUSCULAR | Status: DC | PRN
  Filled 2015-02-26: qty 3

## 2015-02-26 NOTE — ED Notes (Signed)
Patient discussing with sitter where she can go.  States I could go with someone I know but she needs money for her light bill and I'm not giving my money for that.

## 2015-02-26 NOTE — [facility] Counselor (Signed)
Pt. is being reviewed for possible placement with [facility]. 

## 2015-02-26 NOTE — ED Provider Notes (Signed)
CSN: [id number]     Arrival date & time 02/26/15  7829 History   First MD Initiated Contact with Patient 02/26/15 [phone]     Chief Complaint  Patient presents with  . Suicidal  . for dialysis      (Consider location/radiation/quality/duration/timing/severity/associated sxs/prior Treatment) HPI [name] is a 64 year old female with past medical history of hypertension, HIV, ESRD on dialysis Monday Wednesday Friday, CHF who presents the ER seeking help for suicidal ideation. Patient states that for several weeks she has been having thoughts of hurting herself by taking pills or by [facility] her wheelchair out into traffic. Patient states these thoughts and feelings are caused by the fact that she is "sick and tired all of the time". Patient states she has been coming to the emergency room for the past several weeks solely for hemodialysis, as she does not currently have a dialysis center. She states she has been having social issues involving finding a care facility as well as finding living facility. Patient states she has had suicidal ideation the past, however has never and acted upon any of these ideations. Patient denies taking any medications today, however states that she wanted to come in for dialysis, go home and take all of the pills she had at home to kill herself. Patient denies having any specific medical complaints.   Past Medical History  Diagnosis Date  . Hypertension   . HIV infection   . Morbid obesity   . ESRD (end stage renal disease) on dialysis 09/30/2013    Started dialysis in [place], [place] around 2009.  ESRD due to HTN vs drug abuse according to pt.  Was on dialysis at [facility] until Feb 2015 when she was admitted to a SNF due to homelessness and drug abuse.  Then changed to [facility] on TTS schedule.     . Anginal pain   . CHF (congestive heart failure)   . Shortness of breath   . Pneumonia   . Depression   . Anxiety   . GERD (gastroesophageal reflux disease)    . Arthritis   . Anemia   . Hyperkalemia 08/11/2014   Past Surgical History  Procedure Laterality Date  . Arteriovenous graft placement      left forearm  . Cardiac catheterization    . Laparotomy      states seh was cut open because seh was bleeding on the inside   Family History  Problem Relation Age of Onset  . Kidney failure Other     niece  . High blood pressure    . Lung cancer    . Breast cancer Neg Hx   . Colon cancer Neg Hx   . Stroke Mother   . HIV/AIDS Brother     died of AIDS   History  Substance Use Topics  . Smoking status: Current Every Day Smoker -- 0.50 packs/day    Types: Cigarettes    Start date: 05/06/2014  . Smokeless tobacco: Never Used     Comment: recently quit  . Alcohol Use: No   OB History    No data available     Review of Systems  Constitutional: Negative for fever.  HENT: Negative for trouble swallowing.   Eyes: Negative for visual disturbance.  Respiratory: Negative for shortness of breath.   Cardiovascular: Negative for chest pain.  Gastrointestinal: Negative for nausea, vomiting and abdominal pain.  Genitourinary: Negative for dysuria.  Musculoskeletal: Negative for neck pain.  Skin: Negative for rash.  Neurological: Negative for dizziness, weakness and numbness.  Psychiatric/Behavioral: Positive for suicidal ideas and dysphoric mood.      Allergies  Trazodone and nefazodone and Morphine and related  Home Medications   Prior to Admission medications   Medication Sig Start Date End Date Taking? Authorizing Provider  albuterol (PROVENTIL HFA;VENTOLIN HFA) 108 (90 BASE) MCG/ACT inhaler Inhale 2 puffs into the lungs every 6 (six) hours as needed for wheezing or shortness of breath.   Yes Historical Provider, MD  amLODipine (NORVASC) 5 MG tablet Take 10 mg by mouth at bedtime.    Yes Historical Provider, MD  calcitRIOL (ROCALTROL) 0.5 MCG capsule Take 2 mcg by mouth daily. Takes on Mon, Wed, and Fri with Dialysis   Yes  Historical Provider, MD  cinacalcet (SENSIPAR) 90 MG tablet Take 90 mg by mouth daily.   Yes Historical Provider, MD  colchicine 0.6 MG tablet Take 0.6 mg by mouth daily.   Yes Historical Provider, MD  diphenhydrAMINE (BENADRYL) 25 mg capsule Take 50 mg by mouth at bedtime as needed for sleep.   Yes Historical Provider, MD  FLUoxetine (PROZAC) 20 MG capsule Take 20 mg by mouth daily.   Yes Historical Provider, MD  folic acid-vitamin b complex-vitamin c-selenium-zinc (DIALYVITE) 3 MG TABS tablet Take 1 tablet by mouth daily.   Yes Historical Provider, MD  hydrALAZINE (APRESOLINE) 25 MG tablet Take 25 mg by mouth every 8 (eight) hours.   Yes Historical Provider, MD  isosorbide mononitrate (IMDUR) 30 MG 24 hr tablet Take 30 mg by mouth daily.   Yes Historical Provider, MD  lamivudine (EPIVIR-HBV) 5 MG/ML solution Take 5 mLs (25 mg total) by mouth daily. 07/14/14  Yes Gardiner Barefootobert W Comer, MD  LORazepam (ATIVAN) 0.5 MG tablet Take 0.5 mg by mouth every 12 (twelve) hours as needed for anxiety.   Yes Historical Provider, MD  omeprazole (PRILOSEC) 20 MG capsule Take 20 mg by mouth daily.   Yes Historical Provider, MD  oxyCODONE-acetaminophen (PERCOCET/ROXICET) 5-325 MG per tablet Take 1-2 tablets by mouth every 6 (six) hours as needed for severe pain. 02/24/15  Yes Gwyneth SproutWhitney Plunkett, MD  polyethylene glycol (MIRALAX / GLYCOLAX) packet Take 17 g by mouth daily as needed for mild constipation.   Yes Historical Provider, MD  raltegravir (ISENTRESS) 400 MG tablet Take 1 tablet (400 mg total) by mouth 2 (two) times daily. 07/14/14  Yes Gardiner Barefootobert W Comer, MD  sevelamer carbonate (RENVELA) 800 MG tablet Take 2,400 mg by mouth 3 (three) times daily with meals.   Yes Historical Provider, MD  sevelamer carbonate (RENVELA) 800 MG tablet Take 3 tablets (2,400 mg total) by mouth 3 (three) times daily with meals. 08/14/14  Yes Erasmo DownerAngela M Bacigalupo, MD  tenofovir (VIREAD) 300 MG tablet Take 1 tablet (300 mg total) by mouth once a week.  Give on Friday 07/14/14  Yes Gardiner Barefootobert W Comer, MD  amoxicillin-clavulanate (AUGMENTIN) 875-125 MG per tablet Take 1 tablet by mouth 2 (two) times daily. One po bid x 7 days Patient not taking: Reported on 02/19/2015 02/18/15   Felicie Mornavid Smith, NP  HYDROmorphone (DILAUDID) 4 MG tablet Take 0.5 tablets (2 mg total) by mouth 2 (two) times daily as needed for severe pain. Patient not taking: Reported on 02/19/2015 07/04/14   Tomasita CrumbleAdeleke Oni, MD  lamiVUDine (EPIVIR) 10 MG/ML solution Take 2.5 mLs (25 mg total) by mouth daily. Patient not taking: Reported on 02/19/2015 08/14/14   Erasmo DownerAngela M Bacigalupo, MD  lidocaine (LIDODERM) 5 % Place 1 patch onto the  skin daily. Remove & Discard patch within 12 hours or as directed by MD Patient not taking: Reported on 02/19/2015 08/14/14   Erasmo DownerAngela M Bacigalupo, MD  raltegravir (ISENTRESS) 400 MG tablet Take 1 tablet (400 mg total) by mouth 2 (two) times daily. Patient not taking: Reported on 02/19/2015 08/14/14   Erasmo DownerAngela M Bacigalupo, MD  tenofovir (VIREAD) 300 MG tablet Take 1 tablet (300 mg total) by mouth every Friday. Patient not taking: Reported on 02/19/2015 08/14/14   Erasmo DownerAngela M Bacigalupo, MD  tiotropium (SPIRIVA) 18 MCG inhalation capsule Place 18 mcg into inhaler and inhale daily.    Historical Provider, MD   BP 164/95 mmHg  Pulse 95  Temp(Src) 97.7 F (36.5 C) (Oral)  Resp 16  Wt   SpO2 98% Physical Exam  Constitutional: She is oriented to person, place, and time. She appears well-developed and well-nourished. No distress.  HENT:  Head: Normocephalic and atraumatic.  Mouth/Throat: Oropharynx is clear and moist. No oropharyngeal exudate.  Eyes: Right eye exhibits no discharge. Left eye exhibits no discharge. No scleral icterus.  Neck: Normal range of motion.  Cardiovascular: Normal rate, regular rhythm and normal heart sounds.   No murmur heard. Pulmonary/Chest: Effort normal and breath sounds normal. No respiratory distress.  Abdominal: Soft. There is no tenderness.   Musculoskeletal: Normal range of motion. She exhibits no edema or tenderness.  Neurological: She is alert and oriented to person, place, and time. She has normal strength. No cranial nerve deficit or sensory deficit. Coordination normal. GCS eye subscore is 4. GCS verbal subscore is 5. GCS motor subscore is 6.  Patient fully alert, answering questions appropriately in full, clear sentences. Cranial nerves II through XII grossly intact. Motor strength 5 out of 5 in all major muscle groups of upper and lower extremities. Distal sensation intact.   Skin: Skin is warm and dry. No rash noted. She is not diaphoretic.  Psychiatric:  Patient's mood is depressed with affect appropriate to mood. Behavior is appropriate, patient is tearful. Patient does not appear to be responding to any internal stimuli. Thought content revolves around patient's current social issues with living situation, requiring hemodialysis as well as requiring a large amount of help assistance. Judgment and insight are impaired.  Nursing note and vitals reviewed.   ED Course  Procedures (including critical care time) Labs Review Labs Reviewed  CBC WITH DIFFERENTIAL/PLATELET - Abnormal; Notable for the following:    RBC 3.03 (*)    Hemoglobin 10.7 (*)    HCT 34.0 (*)    MCV 112.2 (*)    MCH 35.3 (*)    All other components within normal limits  COMPREHENSIVE METABOLIC PANEL - Abnormal; Notable for the following:    Potassium 5.4 (*)    Glucose, Bld 118 (*)    BUN 50 (*)    Creatinine, Ser 8.74 (*)    Calcium 7.9 (*)    Albumin 3.3 (*)    AST 14 (*)    ALT 11 (*)    Alkaline Phosphatase 299 (*)    GFR calc non Af Amer 4 (*)    GFR calc Af Amer 5 (*)    All other components within normal limits  ACETAMINOPHEN LEVEL - Abnormal; Notable for the following:    Acetaminophen (Tylenol), Serum <10 (*)    All other components within normal limits  ETHANOL  SALICYLATE LEVEL  URINE RAPID DRUG SCREEN (HOSP PERFORMED)   URINALYSIS, ROUTINE W REFLEX MICROSCOPIC    Imaging Review No results found.  EKG Interpretation   Date/Time:  Friday Feb 26 2015 10:22:34 EDT Ventricular Rate:  89 PR Interval:  178 QRS Duration: 107 QT Interval:  390 QTC Calculation: 474 R Axis:   43 Text Interpretation:  Sinus rhythm Minimal ST elevation, inferior leads  since last tracing no significant change Confirmed by Effie Shy  MD, ELLIOTT  ([id number]) on 02/26/2015 10:26:18 AM      MDM   Final diagnoses:  Suicidal ideation  Encounter for hemodialysis    Patient here with complaint of requesting outpatient dialysis as well as voicing suicidal ideation. Patient has specific and clear plan for suicide. TTS consult placed, psychiatric services recommend inpatient placement for patient's suicidal ideation. Based on patient's workup, history and evaluation here there is no concern for organic etiology of patient's suicidal ideation. Patient's labs appear to be at baseline for patient. Patient does not appear to be psychotic, there are no objective signs or symptoms of encephalopathy. Patient to be dialyzed here today, case discussed with nephrology. Patient to be placed in psych hold for inpatient placement.  Signed,  Ladona Mow, PA-C 3:35 PM  Patient seen and discussed with Dr. Mancel Bale, M.D.    Ladona Mow, PA-C 02/26/15 1535  Mancel Bale, MD 02/26/15 [phone]

## 2015-02-26 NOTE — ED Provider Notes (Signed)
7:23 PM the patient is back from dialysis and I went to speak with her. She denies any suicidality on my exam and states that she was saying this because she had nowhere to go after d/c. We'll have social work speak to her about options for housing. Rob (PA) called her motel who states she is welcome back there as long as she pays. Pt happy with this plan.   Clinical Impression 1. Suicidal ideation   2. Encounter for hemodialysis      Purvis SheffieldForrest Lavene Penagos, MD 02/26/15 [phone]

## 2015-02-26 NOTE — ED Notes (Signed)
Pt. reports suicidal ideation and depression onset this week , plans to overdose on medications , denies hallucinations .

## 2015-02-26 NOTE — ED Notes (Addendum)
Pt here via [facility] for dialysis-- pt states "I don't want to live, I'm tired, just let me go" states "i can't get anybody to help me, I can't take it no more. After dialysis I am going to take all of my sleeping pills, or walk out into traffic, I just want to die" pt crying.  Staffing office notified for need of sitter.
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
## 2015-02-26 NOTE — BH Assessment (Signed)
[facility] Assessment Progress Note     Called and scheduled pt's tele assessment with pt's nurse, Clydie BraunKaren, RN, with this clinician and gathered clinical information from Ladona MowJoe Mintz, PA-C at 1025.  Pt to be seen via tele assessment.  Casimer LaniusKristen Orlandus Borowski, MS, [facility] Therapeutic Triage Specialist [facility]

## 2015-02-26 NOTE — ED Notes (Signed)
Pt states having SI with the plan to take sleeping pill. Sitter at the bedside.

## 2015-02-26 NOTE — Progress Notes (Signed)
Hospitals will be contacted again to check if any beds open.  Currently at capacity: [facility]- per [place] Bailiffarlene, gero unit does consider dialysis pts, fax referral. Darlene returned call to say gero unit is now at capacity and referral was not reviewed [facility]- per KennardAshley, unsure if dialysis is exclusionary, but gero unit is at capacity, do not fax [facility]- per Carollee HerterShannon  Declined: [facility]- per Jerilynn Somalvin, due to "unable to appropriately treat". [facility]- per Victorino DikeJennifer- facility cannot accommodate dialysis needs [facility]- per Delorise ShinerGrace- facility cannot accommodate dialysis needs [facility]- per Jasmine DecemberSharon- facility cannot accommodate dialysis needs [facility]- per Cloretta NedQuan- facility cannot accommodate dialysis needs  CSW will continue to seek placement.  Melbourne Abtsatia Mystery Schrupp, LCSWA Disposition staff 02/26/2015 6:50 PM

## 2015-02-26 NOTE — ED Notes (Signed)
Sitter called for pt. 
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
## 2015-02-26 NOTE — ED Provider Notes (Signed)
  Face-to-face evaluation   History: She is here for evaluation of suicidal thoughts associated with homelessness, and losing her hotel room, today. She is also dialysis dependent, without a treatment center. She was last dialyzed here 2 days ago.  Physical exam: Morbidly obese, alert. No respiratory distress. Patient states she wants me to let her out of the hospital so she can go kill herself.  I discussed case with the nephrology PA. She will consider dialyzing the patient today.  Medical screening examination/treatment/procedure(s) were conducted as a shared visit with non-physician practitioner(s) and myself.  I personally evaluated the patient during the encounter  Mancel BaleElliott Loretta Doutt, MD 02/26/15 1727

## 2015-02-26 NOTE — ED Notes (Signed)
Discharged home.  Taken to waiting room in a wheelchair.  States if I have any more bad thoughts I'll come back in.

## 2015-02-26 NOTE — ED Notes (Signed)
Patient denies being suicidal states i'm just here so I have somewhere to stay.  Being discharged at this time.  States I have nowhere to go.  Making calls trying to find somewhere to go.

## 2015-02-26 NOTE — ED Notes (Signed)
Spoke with dialysis nurse, pt to be transported upstairs to dialysis and then back to pod C when finished.

## 2015-02-26 NOTE — Progress Notes (Signed)
CSW attempting to secure inpatient placement for pt.    Under review: [facility]- per Jerilynn Somalvin will review   At capacity: [facility]- per [place] Bailiffarlene, gero unit does consider dialysis pts, fax referral. Darlene returned call to say gero unit is now at capacity and referral was not reviewed [facility]- per Morrie SheldonAshley, unsure if dialysis is exclusionary, but gero unit is at capacity, do not fax [facility]- per Carollee HerterShannon  [facility] per Victorino DikeJennifer- facility cannot accommodate dialysis needs [facility]- per Delorise ShinerGrace- facility cannot accommodate dialysis needs [facility]- per Jasmine DecemberSharon- facility cannot accommodate dialysis needs [facility]- per Cloretta NedQuan- facility cannot accommodate dialysis needs  Ilean SkillMeghan Laramie Gelles, MSW, [facility] CenterCSWA Clinical Social Work, Disposition  02/26/2015 [phone]

## 2015-02-26 NOTE — BHH Counselor (Signed)
Pt. declined by Dr. Hernandez ([facility]). "Unable to Appropriately Treat."  

## 2015-02-26 NOTE — ED Notes (Signed)
With pt's permission, this RN updated the pt's daughter on the plan of care.

## 2015-02-27 DIAGNOSIS — F331 Major depressive disorder, recurrent, moderate: Secondary | ICD-10-CM

## 2015-02-27 DIAGNOSIS — Z5900 Homelessness unspecified: Secondary | ICD-10-CM

## 2015-02-27 DIAGNOSIS — R45851 Suicidal ideations: Secondary | ICD-10-CM | POA: Diagnosis not present

## 2015-02-27 MED ORDER — AMLODIPINE BESYLATE 5 MG PO TABS
10.0000 mg | ORAL_TABLET | Freq: Every day | ORAL | Status: DC
  Administered 2015-02-27: 10 mg via ORAL
  Filled 2015-02-27: qty 2

## 2015-02-27 MED ORDER — AMITRIPTYLINE HCL 25 MG PO TABS
25.0000 mg | ORAL_TABLET | Freq: Every day | ORAL | Status: DC
  Administered 2015-02-27: 25 mg via ORAL
  Filled 2015-02-27: qty 1

## 2015-02-27 MED ORDER — ISOSORBIDE MONONITRATE ER 30 MG PO TB24
30.0000 mg | ORAL_TABLET | Freq: Every day | ORAL | Status: DC
  Administered 2015-02-27: 30 mg via ORAL
  Filled 2015-02-27: qty 1

## 2015-02-27 MED ORDER — FLUOXETINE HCL 20 MG PO CAPS
20.0000 mg | ORAL_CAPSULE | Freq: Every day | ORAL | Status: DC
  Administered 2015-02-27: 20 mg via ORAL
  Filled 2015-02-27: qty 1

## 2015-02-27 MED ORDER — TIOTROPIUM BROMIDE MONOHYDRATE 18 MCG IN CAPS
18.0000 ug | ORAL_CAPSULE | Freq: Every day | RESPIRATORY_TRACT | Status: DC
  Administered 2015-02-27: 18 ug via RESPIRATORY_TRACT
  Filled 2015-02-27: qty 5

## 2015-02-27 MED ORDER — TENOFOVIR DISOPROXIL FUMARATE 300 MG PO TABS: 300.0000 mg | ORAL_TABLET | ORAL | Status: DC

## 2015-02-27 MED ORDER — ALBUTEROL SULFATE HFA 108 (90 BASE) MCG/ACT IN AERS: 2.0000 | INHALATION_SPRAY | Freq: Four times a day (QID) | RESPIRATORY_TRACT | Status: DC | PRN

## 2015-02-27 MED ORDER — LAMIVUDINE 150 MG PO TABS
150.0000 mg | ORAL_TABLET | Freq: Every morning | ORAL | Status: DC
  Administered 2015-02-27: 150 mg via ORAL
  Filled 2015-02-27: qty 1

## 2015-02-27 MED ORDER — GABAPENTIN 300 MG PO CAPS
300.0000 mg | ORAL_CAPSULE | Freq: Three times a day (TID) | ORAL | Status: DC
  Administered 2015-02-27 (×2): 300 mg via ORAL
  Filled 2015-02-27 (×2): qty 1

## 2015-02-27 MED ORDER — ACETAMINOPHEN 325 MG PO TABS: 650.0000 mg | ORAL_TABLET | ORAL | Status: DC | PRN

## 2015-02-27 MED ORDER — RALTEGRAVIR POTASSIUM 400 MG PO TABS
400.0000 mg | ORAL_TABLET | Freq: Two times a day (BID) | ORAL | Status: DC
  Administered 2015-02-27: 400 mg via ORAL
  Filled 2015-02-27 (×2): qty 1

## 2015-02-27 NOTE — Progress Notes (Signed)
CSW faxed referrals to the following inpatient facilities:  [facility]   CSW spoke to the RN for patient who states patient is now wanting to be discharged.  She is requesting a taxi voucher to her motel and from there she will go to her daughter's house.  Patient is asking for a splint on her finger and nothing further.  Patient was seen by psych, CSW will consult with psych on disposition.  Seward SpeckLeo Gwenn Teodoro [facility] OhioCSW,LCAS Behavioral Health Disposition CSW [phone]

## 2015-02-27 NOTE — Social Work (Signed)
CSW met with patient in order to assess discharge needs.  Patient currently has no transportation and will be staying with her daughter in [place]. Patient is immobile and unable to mobilize without a walker or wheelchair. CSW arranging Transport via EMS. Patient aware of potential financial responsibility.   No further CSW needs as patient being discharged.   Christene Lye MSW, [place]

## 2015-02-27 NOTE — ED Notes (Addendum)
STATES AMBULATES W/WALKER AND W/C. REQUESTING FOR SW TO OBTAIN CAB FOR HER FOR RIDE TO [facility]. STATES THEN SHE WILL GO TO HER DAUGHTER'S IN [place] ON HER OWN.

## 2015-02-27 NOTE — ED Notes (Signed)
Telepsych taking place at bedside.

## 2015-02-27 NOTE — ED Notes (Signed)
PT STATES SHE WANTS THE HOSPITAL TO PROVIDE A TAXI FOR HER AND TAKE HER TO THE [facility] TO PICK UP HER BELONGINGS THEN TAKE HER TO HER DAUGHTER'S IN [place], [place]. TWYNN, SW, ADVISED PT WE ARE UNABLE TO PROVIDE THIS SERVICE, HOWEVER, WE CAN PROVIDE HER A TAXI RIDE TO THE [facility]. INITIALLY, PT WAS WANTING TO GO TO [facility] - NOW STATES SHE IS NOT ABLE TO GET INTO [facility] D/T OWES $100. OFFERED FOR PT TO CALL HER FRIENDS/FAMILY MEMBERS FOR RIDE FROM ED. PT REFUSED - STATES SHE DOES NOT HAVE ANYONE WHO CAN DO THIS. SW ADVISED PT [facility] CAN BE CALLED FOR TRANSPORTATION TO HER DAUGHTER'S HOUSE BUT THEY WILL NOT STOP BY [facility] TO PICK UP HER BELONGINGS (W/C, WALKER, CLOTHING, ETC).

## 2015-02-27 NOTE — ED Notes (Signed)
Simonne ComeLeo, SW, advised faxing psych referrals.

## 2015-02-27 NOTE — ED Notes (Signed)
Pt and RN spoke w/pt's daughter, Levern. Advised of pt's pendiJovita Gammang d/c. Daughter asked if the hospital can pay for a taxi to transport her to her home in [place], [place]. Advised her no and that SW is coming to ED to discuss possible transportation to pt's motel w/pt. Levern - [phone].
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
## 2015-02-27 NOTE — ED Notes (Signed)
Per Simonne ComeLeo, SW, [facility], plan is for pt to be d/c'd to home.  Renata Capriceonrad, NP, [facility], will consult w/Dr Silverio LayYao.

## 2015-02-27 NOTE — ED Provider Notes (Signed)
  Physical Exam  BP 155/79 mmHg  Pulse 83  Temp(Src) 98.7 F (37.1 C) (Oral)  Resp 18  Ht 5\' 4"  (1.626 m)  Wt 254 lb (115.214 kg)  BMI 43.58 kg/m2  SpO2 99%  Physical Exam  ED Course  Procedures  MDM Patient sleeping this AM. Dialysis patient here with suicidal ideation. Last dialyzed yesterday, will need dialysis Monday. Pending inpatient psych facility that takes dialysis.      Richardean Canalavid H Earle Troiano, MD 02/27/15 [phone]

## 2015-02-27 NOTE — ED Notes (Signed)
Secretary paging SW x 2.

## 2015-02-27 NOTE — ED Notes (Signed)
Twynn, SW, advised will be in to see pt shortly.

## 2015-02-27 NOTE — BH Assessment (Signed)
[facility] Assessment Progress Note   Clinician called [facility], [facility], [facility], [facility] and they do not do dialysis.  Clinician called [facility] and [facility] (which do dialysis) and they are full.
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
## 2015-02-27 NOTE — ED Notes (Signed)
LAVERNE - [phone] - PT'S DAUGHTER CALLED. ADVISED HER WILL HAVE PT RETURN CALL.

## 2015-02-27 NOTE — ED Notes (Signed)
Statisticianecretary paging SW.

## 2015-02-27 NOTE — ED Notes (Signed)
Twynn, SW, aware of need for him to please see pt d/t housing/homeless and request for taxi voucher d/t states she is unable to ambulate w/o her walker and w/c which are [facility] at [facility].
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
## 2015-03-01 ENCOUNTER — Encounter (HOSPITAL_COMMUNITY): Payer: Self-pay | Admitting: Radiology

## 2015-03-01 ENCOUNTER — Emergency Department (HOSPITAL_COMMUNITY)
Admission: EM | Admit: 2015-03-01 | Discharge: 2015-03-04 | Disposition: A | Discharge status: Home / Self Care | Payer: Medicare Other | Attending: Emergency Medicine | Admitting: Emergency Medicine

## 2015-03-01 DIAGNOSIS — I120 Hypertensive chronic kidney disease with stage 5 chronic kidney disease or end stage renal disease: Secondary | ICD-10-CM | POA: Diagnosis not present

## 2015-03-01 DIAGNOSIS — I509 Heart failure, unspecified: Secondary | ICD-10-CM | POA: Insufficient documentation

## 2015-03-01 DIAGNOSIS — R45851 Suicidal ideations: Secondary | ICD-10-CM

## 2015-03-01 DIAGNOSIS — I209 Angina pectoris, unspecified: Secondary | ICD-10-CM | POA: Insufficient documentation

## 2015-03-01 DIAGNOSIS — Z79899 Other long term (current) drug therapy: Secondary | ICD-10-CM | POA: Insufficient documentation

## 2015-03-01 DIAGNOSIS — K219 Gastro-esophageal reflux disease without esophagitis: Secondary | ICD-10-CM | POA: Insufficient documentation

## 2015-03-01 DIAGNOSIS — Z21 Asymptomatic human immunodeficiency virus [HIV] infection status: Secondary | ICD-10-CM | POA: Diagnosis not present

## 2015-03-01 DIAGNOSIS — F329 Major depressive disorder, single episode, unspecified: Secondary | ICD-10-CM | POA: Diagnosis not present

## 2015-03-01 DIAGNOSIS — D649 Anemia, unspecified: Secondary | ICD-10-CM | POA: Diagnosis not present

## 2015-03-01 DIAGNOSIS — Z992 Dependence on renal dialysis: Secondary | ICD-10-CM

## 2015-03-01 DIAGNOSIS — N186 End stage renal disease: Secondary | ICD-10-CM | POA: Diagnosis not present

## 2015-03-01 DIAGNOSIS — Z720 Tobacco use: Secondary | ICD-10-CM | POA: Insufficient documentation

## 2015-03-01 DIAGNOSIS — Z9889 Other specified postprocedural states: Secondary | ICD-10-CM | POA: Diagnosis not present

## 2015-03-01 DIAGNOSIS — Z8701 Personal history of pneumonia (recurrent): Secondary | ICD-10-CM | POA: Insufficient documentation

## 2015-03-01 DIAGNOSIS — Z8739 Personal history of other diseases of the musculoskeletal system and connective tissue: Secondary | ICD-10-CM | POA: Diagnosis not present

## 2015-03-01 DIAGNOSIS — F419 Anxiety disorder, unspecified: Secondary | ICD-10-CM | POA: Insufficient documentation

## 2015-03-01 LAB — COMPREHENSIVE METABOLIC PANEL
ALT: 8 U/L — ABNORMAL LOW (ref 14–54)
AST: 12 U/L — ABNORMAL LOW (ref 15–41)
Albumin: 3.3 g/dL — ABNORMAL LOW (ref 3.5–5.0)
Alkaline Phosphatase: 337 U/L — ABNORMAL HIGH (ref 38–126)
Anion gap: 18 — ABNORMAL HIGH (ref 5–15)
BUN: 73 mg/dL — ABNORMAL HIGH (ref 6–20)
CO2: 21 mmol/L — ABNORMAL LOW (ref 22–32)
Calcium: 8.2 mg/dL — ABNORMAL LOW (ref 8.9–10.3)
Chloride: 97 mmol/L — ABNORMAL LOW (ref 101–111)
Creatinine, Ser: 10.74 mg/dL — ABNORMAL HIGH (ref 0.44–1.00)
GFR calc Af Amer: 4 mL/min — ABNORMAL LOW (ref >60)
GFR calc non Af Amer: 3 mL/min — ABNORMAL LOW (ref >60)
Glucose, Bld: 125 mg/dL — ABNORMAL HIGH (ref 65–99)
Potassium: 5.7 mmol/L — ABNORMAL HIGH (ref 3.5–5.1)
Sodium: 136 mmol/L (ref 135–145)
Total Bilirubin: 0.6 mg/dL (ref 0.3–1.2)
Total Protein: 6.9 g/dL (ref 6.5–8.1)

## 2015-03-01 LAB — I-STAT CHEM 8, ED
BUN: 76 mg/dL — ABNORMAL HIGH (ref 6–20)
Calcium, Ion: 0.97 mmol/L — ABNORMAL LOW (ref 1.13–1.30)
Chloride: 103 mmol/L (ref 101–111)
Creatinine, Ser: 10 mg/dL — ABNORMAL HIGH (ref 0.44–1.00)
Glucose, Bld: 138 mg/dL — ABNORMAL HIGH (ref 65–99)
HCT: 32 % — ABNORMAL LOW (ref 36.0–46.0)
Hemoglobin: 10.9 g/dL — ABNORMAL LOW (ref 12.0–15.0)
Potassium: 5.7 mmol/L — ABNORMAL HIGH (ref 3.5–5.1)
Sodium: 134 mmol/L — ABNORMAL LOW (ref 135–145)
TCO2: 21 mmol/L (ref 0–100)

## 2015-03-01 LAB — CBC WITH DIFFERENTIAL/PLATELET
Basophils Absolute: 0 10*3/uL (ref 0.0–0.1)
Basophils Relative: 0 % (ref 0–1)
Eosinophils Absolute: 0.1 10*3/uL (ref 0.0–0.7)
Eosinophils Relative: 1 % (ref 0–5)
HCT: 32 % — ABNORMAL LOW (ref 36.0–46.0)
Hemoglobin: 10.1 g/dL — ABNORMAL LOW (ref 12.0–15.0)
Lymphocytes Relative: 30 % (ref 12–46)
Lymphs Abs: 2.8 10*3/uL (ref 0.7–4.0)
MCH: 34.7 pg — ABNORMAL HIGH (ref 26.0–34.0)
MCHC: 31.6 g/dL (ref 30.0–36.0)
MCV: 110 fL — ABNORMAL HIGH (ref 78.0–100.0)
Monocytes Absolute: 0.6 10*3/uL (ref 0.1–1.0)
Monocytes Relative: 7 % (ref 3–12)
Neutro Abs: 5.8 10*3/uL (ref 1.7–7.7)
Neutrophils Relative %: 62 % (ref 43–77)
Platelets: 247 10*3/uL (ref 150–400)
RBC: 2.91 MIL/uL — ABNORMAL LOW (ref 3.87–5.11)
RDW: 14 % (ref 11.5–15.5)
WBC: 9.4 10*3/uL (ref 4.0–10.5)

## 2015-03-01 LAB — ETHANOL: Alcohol, Ethyl (B): <5 mg/dL (ref <5)

## 2015-03-01 MED ORDER — POLYETHYLENE GLYCOL 3350 17 G PO PACK
17.0000 g | PACK | Freq: Every day | ORAL | Status: DC | PRN
  Filled 2015-03-01: qty 1

## 2015-03-01 MED ORDER — RENA-VITE PO TABS
1.0000 | ORAL_TABLET | Freq: Every day | ORAL | Status: DC
  Administered 2015-03-01 – 2015-03-03 (×2): 1 via ORAL
  Filled 2015-03-01 (×5): qty 1

## 2015-03-01 MED ORDER — DIPHENHYDRAMINE HCL 25 MG PO CAPS: 50.0000 mg | ORAL_CAPSULE | Freq: Every evening | ORAL | Status: DC | PRN

## 2015-03-01 MED ORDER — COLCHICINE 0.6 MG PO TABS
0.6000 mg | ORAL_TABLET | Freq: Every day | ORAL | Status: DC
  Administered 2015-03-01 – 2015-03-04 (×4): 0.6 mg via ORAL
  Filled 2015-03-01 (×4): qty 1

## 2015-03-01 MED ORDER — RALTEGRAVIR POTASSIUM 400 MG PO TABS
400.0000 mg | ORAL_TABLET | Freq: Two times a day (BID) | ORAL | Status: DC
  Administered 2015-03-01 – 2015-03-04 (×5): 400 mg via ORAL
  Filled 2015-03-01 (×8): qty 1

## 2015-03-01 MED ORDER — SEVELAMER CARBONATE 800 MG PO TABS
2400.0000 mg | ORAL_TABLET | Freq: Three times a day (TID) | ORAL | Status: DC
  Administered 2015-03-02 – 2015-03-04 (×8): 2400 mg via ORAL
  Filled 2015-03-01 (×11): qty 3

## 2015-03-01 MED ORDER — FLUOXETINE HCL 20 MG PO CAPS
20.0000 mg | ORAL_CAPSULE | Freq: Every day | ORAL | Status: DC
  Administered 2015-03-01 – 2015-03-04 (×4): 20 mg via ORAL
  Filled 2015-03-01 (×4): qty 1

## 2015-03-01 MED ORDER — LORAZEPAM 1 MG PO TABS: 1.0000 mg | ORAL_TABLET | Freq: Three times a day (TID) | ORAL | Status: DC | PRN

## 2015-03-01 MED ORDER — CALCITRIOL 0.5 MCG PO CAPS
2.0000 ug | ORAL_CAPSULE | ORAL | Status: DC
  Administered 2015-03-03: 2 ug via ORAL
  Filled 2015-03-01: qty 4

## 2015-03-01 MED ORDER — DIALYVITE 3000 3 MG PO TABS: 1.0000 | ORAL_TABLET | Freq: Every day | ORAL | Status: DC

## 2015-03-01 MED ORDER — SEVELAMER CARBONATE 800 MG PO TABS: 2400.0000 mg | ORAL_TABLET | Freq: Three times a day (TID) | ORAL | Status: DC

## 2015-03-01 MED ORDER — CINACALCET HCL 30 MG PO TABS
90.0000 mg | ORAL_TABLET | Freq: Every day | ORAL | Status: DC
  Administered 2015-03-02 – 2015-03-04 (×3): 90 mg via ORAL
  Filled 2015-03-01 (×4): qty 3

## 2015-03-01 MED ORDER — OXYCODONE-ACETAMINOPHEN 5-325 MG PO TABS
1.0000 | ORAL_TABLET | Freq: Four times a day (QID) | ORAL | Status: DC | PRN
  Administered 2015-03-03: 1 via ORAL
  Filled 2015-03-01 (×2): qty 1

## 2015-03-01 MED ORDER — LORAZEPAM 1 MG PO TABS: 0.5000 mg | ORAL_TABLET | Freq: Two times a day (BID) | ORAL | Status: DC | PRN

## 2015-03-01 MED ORDER — AMLODIPINE BESYLATE 5 MG PO TABS
10.0000 mg | ORAL_TABLET | Freq: Every day | ORAL | Status: DC
  Administered 2015-03-01 – 2015-03-03 (×2): 10 mg via ORAL
  Filled 2015-03-01 (×3): qty 2

## 2015-03-01 MED ORDER — TIOTROPIUM BROMIDE MONOHYDRATE 18 MCG IN CAPS
18.0000 ug | ORAL_CAPSULE | Freq: Every day | RESPIRATORY_TRACT | Status: DC
Start: 2015-03-02 — End: 2015-03-04
  Administered 2015-03-01 – 2015-03-04 (×4): 18 ug via RESPIRATORY_TRACT
  Filled 2015-03-01: qty 5

## 2015-03-01 MED ORDER — ISOSORBIDE MONONITRATE ER 30 MG PO TB24
30.0000 mg | ORAL_TABLET | Freq: Every day | ORAL | Status: DC
  Administered 2015-03-02: 30 mg via ORAL
  Filled 2015-03-01 (×5): qty 1

## 2015-03-01 MED ORDER — LAMIVUDINE 10 MG/ML PO SOLN
25.0000 mg | Freq: Every day | ORAL | Status: DC
  Administered 2015-03-01 – 2015-03-04 (×4): 25 mg via ORAL
  Filled 2015-03-01 (×5): qty 5

## 2015-03-01 MED ORDER — ACETAMINOPHEN 325 MG PO TABS
650.0000 mg | ORAL_TABLET | ORAL | Status: DC | PRN
  Administered 2015-03-02 – 2015-03-04 (×2): 650 mg via ORAL
  Filled 2015-03-01 (×2): qty 2

## 2015-03-01 MED ORDER — LAMIVUDINE 5 MG/ML PO SOLN: 25.0000 mg | Freq: Every day | ORAL | Status: DC

## 2015-03-01 MED ORDER — HYDRALAZINE HCL 25 MG PO TABS
25.0000 mg | ORAL_TABLET | Freq: Three times a day (TID) | ORAL | Status: DC
  Administered 2015-03-01 – 2015-03-04 (×7): 25 mg via ORAL
  Filled 2015-03-01 (×12): qty 1

## 2015-03-01 MED ORDER — TENOFOVIR DISOPROXIL FUMARATE 300 MG PO TABS: 300.0000 mg | ORAL_TABLET | ORAL | Status: DC

## 2015-03-01 MED ORDER — ALBUTEROL SULFATE HFA 108 (90 BASE) MCG/ACT IN AERS: 2.0000 | INHALATION_SPRAY | Freq: Four times a day (QID) | RESPIRATORY_TRACT | Status: DC | PRN

## 2015-03-01 MED ORDER — ONDANSETRON HCL 4 MG PO TABS: 4.0000 mg | ORAL_TABLET | Freq: Three times a day (TID) | ORAL | Status: DC | PRN

## 2015-03-01 NOTE — ED Notes (Signed)
Stick unsuccessful. Called phlebotomy to stick patient at this time.
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
## 2015-03-01 NOTE — ED Notes (Signed)
Pt requesting pain medication to R finger from previous injury. Swelling noted.

## 2015-03-01 NOTE — ED Notes (Signed)
Meal tray at bedside.  

## 2015-03-01 NOTE — ED Notes (Signed)
Per Department director pt is able to return to Pod C upon completion of her dialysis.

## 2015-03-01 NOTE — ED Provider Notes (Signed)
CSN: [id number]     Arrival date & time 03/01/15  0908 History   First MD Initiated Contact with Patient 03/01/15 0913     Chief Complaint  Patient presents with  . Suicide Attempt  . Vascular Access Problem    HPI   [name] is a 64 year old female with past medical history of hypertension, HIV, ESRD on dialysis Monday Wednesday Friday, CHF who presents the ER seeking help for suicidal ideation and dialysis treatment. Patient reports that she is recently moved to the area does not have a dialysis Center and is currently living with her daughter. She reports that over the past few weeks she has begun to have suicidal ideations reporting that she has been staying with her daughter who is abusing crack and marijuana, she feels unsafe in her current living situation, and that the only solution would be for her to kill herself, reporting that she would take all of her medications at once. She reports that the only thing stopping her is the fact that she does not have her medications, she hasn't had them for 5 days. Patient reports that she is a dialysis patient, does not have a dialysis center and has been using the emergency room for access to her dialysis. She reports she goes Monday Wednesday Friday, with her last dialysis most recently 4 days ago on Friday. Patient denies any other complaints in addition to suicide ideations, she denies headache, shortness of breath, chest pain, abdominal pain, lower extremity swelling or edema, fevers chills, nausea vomiting. Patient reports that she has not followed up with social work.  Past Medical History  Diagnosis Date  . Hypertension   . HIV infection   . Morbid obesity   . ESRD (end stage renal disease) on dialysis 09/30/2013    Started dialysis in [place], [place] around 2009.  ESRD due to HTN vs drug abuse according to pt.  Was on dialysis at [facility] until Feb 2015 when she was admitted to a SNF due to homelessness and drug abuse.  Then changed  to [facility] on TTS schedule.     . Anginal pain   . CHF (congestive heart failure)   . Shortness of breath   . Pneumonia   . Depression   . Anxiety   . GERD (gastroesophageal reflux disease)   . Arthritis   . Anemia   . Hyperkalemia 08/11/2014   Past Surgical History  Procedure Laterality Date  . Arteriovenous graft placement      left forearm  . Cardiac catheterization    . Laparotomy      states seh was cut open because seh was bleeding on the inside   Family History  Problem Relation Age of Onset  . Kidney failure Other     niece  . High blood pressure    . Lung cancer    . Breast cancer Neg Hx   . Colon cancer Neg Hx   . Stroke Mother   . HIV/AIDS Brother     died of AIDS   History  Substance Use Topics  . Smoking status: Current Every Day Smoker -- 0.50 packs/day    Types: Cigarettes    Start date: 05/06/2014  . Smokeless tobacco: Never Used     Comment: recently quit  . Alcohol Use: No   OB History    No data available     Review of Systems  All other systems reviewed and are negative.   Allergies  Trazodone and  nefazodone and Morphine and related  Home Medications   Prior to Admission medications   Medication Sig Start Date End Date Taking? Authorizing Provider  albuterol (PROVENTIL HFA;VENTOLIN HFA) 108 (90 BASE) MCG/ACT inhaler Inhale 2 puffs into the lungs every 6 (six) hours as needed for wheezing or shortness of breath.   Yes Historical Provider, MD  amLODipine (NORVASC) 5 MG tablet Take 10 mg by mouth at bedtime.    Yes Historical Provider, MD  calcitRIOL (ROCALTROL) 0.5 MCG capsule Take 2 mcg by mouth daily. Takes on Mon, Wed, and Fri with Dialysis   Yes Historical Provider, MD  cinacalcet (SENSIPAR) 90 MG tablet Take 90 mg by mouth daily.   Yes Historical Provider, MD  colchicine 0.6 MG tablet Take 0.6 mg by mouth daily.   Yes Historical Provider, MD  diphenhydrAMINE (BENADRYL) 25 mg capsule Take 50 mg by mouth at bedtime as needed for  sleep.   Yes Historical Provider, MD  FLUoxetine (PROZAC) 20 MG capsule Take 20 mg by mouth daily.   Yes Historical Provider, MD  folic acid-vitamin b complex-vitamin c-selenium-zinc (DIALYVITE) 3 MG TABS tablet Take 1 tablet by mouth daily.   Yes Historical Provider, MD  hydrALAZINE (APRESOLINE) 25 MG tablet Take 25 mg by mouth every 8 (eight) hours.   Yes Historical Provider, MD  isosorbide mononitrate (IMDUR) 30 MG 24 hr tablet Take 30 mg by mouth daily.   Yes Historical Provider, MD  lamivudine (EPIVIR-HBV) 5 MG/ML solution Take 5 mLs (25 mg total) by mouth daily. 07/14/14  Yes Gardiner Barefootobert W Comer, MD  LORazepam (ATIVAN) 0.5 MG tablet Take 0.5 mg by mouth every 12 (twelve) hours as needed for anxiety.   Yes Historical Provider, MD  omeprazole (PRILOSEC) 20 MG capsule Take 20 mg by mouth daily.   Yes Historical Provider, MD  polyethylene glycol (MIRALAX / GLYCOLAX) packet Take 17 g by mouth daily as needed for mild constipation.   Yes Historical Provider, MD  raltegravir (ISENTRESS) 400 MG tablet Take 1 tablet (400 mg total) by mouth 2 (two) times daily. 07/14/14  Yes Gardiner Barefootobert W Comer, MD  sevelamer carbonate (RENVELA) 800 MG tablet Take 2,400 mg by mouth 3 (three) times daily with meals.   Yes Historical Provider, MD  sevelamer carbonate (RENVELA) 800 MG tablet Take 3 tablets (2,400 mg total) by mouth 3 (three) times daily with meals. 08/14/14  Yes Erasmo DownerAngela M Bacigalupo, MD  tenofovir (VIREAD) 300 MG tablet Take 1 tablet (300 mg total) by mouth once a week. Give on Friday 07/14/14  Yes Gardiner Barefootobert W Comer, MD  tiotropium (SPIRIVA) 18 MCG inhalation capsule Place 18 mcg into inhaler and inhale daily.   Yes Historical Provider, MD  oxyCODONE-acetaminophen (PERCOCET/ROXICET) 5-325 MG per tablet Take 1-2 tablets by mouth every 6 (six) hours as needed for severe pain. Patient not taking: Reported on 03/01/2015 02/24/15   Gwyneth SproutWhitney Plunkett, MD   BP 159/79 mmHg  Pulse 88  Temp(Src) 98 F (36.7 C) (Oral)  Resp 16  Ht  5\' 4"  (1.626 m)  Wt 254 lb (115.214 kg)  BMI 43.58 kg/m2  SpO2 99% Physical Exam  Constitutional: She is oriented to person, place, and time. She appears well-developed and well-nourished.  HENT:  Head: Normocephalic and atraumatic.  Eyes: Pupils are equal, round, and reactive to light.  Neck: Normal range of motion. Neck supple. No JVD present. No tracheal deviation present. No thyromegaly present.  Cardiovascular: Normal rate, regular rhythm, normal heart sounds and intact distal pulses.  Exam reveals  no gallop and no friction rub.   No murmur heard. Pulmonary/Chest: Effort normal and breath sounds normal. No stridor. No respiratory distress. She has no wheezes. She has no rales. She exhibits no tenderness.  Musculoskeletal: Normal range of motion.  Lymphadenopathy:    She has no cervical adenopathy.  Neurological: She is alert and oriented to person, place, and time. She has normal strength. No cranial nerve deficit or sensory deficit. Coordination normal. GCS eye subscore is 4. GCS verbal subscore is 5. GCS motor subscore is 6.  Skin: Skin is warm and dry.  Psychiatric: She has a normal mood and affect. Her behavior is normal. Judgment and thought content normal.  Patient has a depressed mood, she is tearful, with suicidal ideations  Nursing note and vitals reviewed.   ED Course  Procedures (including critical care time) Labs Review Labs Reviewed  I-STAT CHEM 8, ED    Imaging Review No results found.   EKG Interpretation   Date/Time:  Monday Mar 01 2015 09:39:12 EDT Ventricular Rate:  89 PR Interval:  183 QRS Duration: 102 QT Interval:  382 QTC Calculation: 465 R Axis:   51 Text Interpretation:  Sinus rhythm No significant change since last  tracing Confirmed by Anitra Lauth  MD, Alphonzo Lemmings ([id number]) on 03/01/2015 10:30:33  AM      MDM   Final diagnoses:  Suicidal ideation  Dialysis patient    Labs: I-STAT Chem-8  Imaging: EKG  Consults: Nephrology,  TTS  Therapeutics: None  Assessment: Suicidal ideations, dialysis  Plan:  Patient presents with suicidal ideations. She reports that she is tired of dealing with her social issues and plans on taking her prescription medications in attempt to kill herself. Patient also presents for hemodialysis as she reports that she does not miss her hemodialysis. She has no additional complaints other than the psychiatric issues; denies HI, and the need for her dialysis. Patient's CBC CMP within normal limits for patient. Patient was transferred to do dialysis, TTS was consult at for further evaluation and management of her suicidal ideations.     Eyvonne Mechanic, PA-C 03/01/15 1727  Gwyneth Sprout, MD 03/02/15 1754

## 2015-03-01 NOTE — ED Notes (Signed)
Pt returned from dialysis; given [place] sandwich by sitter. Pt now requesting meal tray. Pt c/o SI, states "I woke up this morning and I shouldn't be living." Pt reports visual hallucinations

## 2015-03-01 NOTE — ED Notes (Signed)
MD at bedside. 

## 2015-03-01 NOTE — ED Notes (Signed)
TTS machine at bedside. 

## 2015-03-01 NOTE — BH Assessment (Addendum)
Tele Assessment Note   [name] is a 64 y.o. female who voluntarily presents to [facility] with SI and depression.  Pt says--"tired of dialysis and sick of living, when I leave the hospital this time, there will be no more of me". Pt states she has been SI x2wks with a plan to overdose on medication or "wheel my wheelchair on to the highway".  Pt states she attempted SI before, however her relative stopped before she could complete her plan. Pt denies HI, but told medical staff that when she closes her eyes she sees dead people.  Pt has a hx of crack/cocaine and alcohol use, but states she stopped using 3 mos ago.  Pt is currently a dialysis pt and states she recently moved to this area and is not connected with a dialysis center for tx.  Pt says that since living with her daughter, she feels like the only solution to her problems is kill herself because she feels unsafe living with her daughter and the daughter's boyfriend because they use drugs.  Pt says she has to use the emerg dept for dialysis tx, her last dialysis tx was 4 days ago and she recv'd tx today once she arrived at the [facility].     Axis I: Major Depression, Recurrent severe Axis II: Deferred Axis III:  Past Medical History  Diagnosis Date  . Hypertension   . HIV infection   . Morbid obesity   . ESRD (end stage renal disease) on dialysis 09/30/2013    Started dialysis in [place], [place] around 2009.  ESRD due to HTN vs drug abuse according to pt.  Was on dialysis at [facility] until Feb 2015 when she was admitted to a SNF due to homelessness and drug abuse.  Then changed to [facility] on TTS schedule.     . Anginal pain   . CHF (congestive heart failure)   . Shortness of breath   . Pneumonia   . Depression   . Anxiety   . GERD (gastroesophageal reflux disease)   . Arthritis   . Anemia   . Hyperkalemia 08/11/2014   Axis IV: housing problems, other psychosocial or environmental problems, problems related to social  environment, problems with access to health care services and problems with primary support group Axis V: 31-40 impairment in reality testing  Past Medical History:  Past Medical History  Diagnosis Date  . Hypertension   . HIV infection   . Morbid obesity   . ESRD (end stage renal disease) on dialysis 09/30/2013    Started dialysis in [place], [place] around 2009.  ESRD due to HTN vs drug abuse according to pt.  Was on dialysis at [facility] until Feb 2015 when she was admitted to a SNF due to homelessness and drug abuse.  Then changed to [facility] on TTS schedule.     . Anginal pain   . CHF (congestive heart failure)   . Shortness of breath   . Pneumonia   . Depression   . Anxiety   . GERD (gastroesophageal reflux disease)   . Arthritis   . Anemia   . Hyperkalemia 08/11/2014    Past Surgical History  Procedure Laterality Date  . Arteriovenous graft placement      left forearm  . Cardiac catheterization    . Laparotomy      states seh was cut open because seh was bleeding on the inside    Family History:  Family History  Problem Relation Age of Onset  . Kidney failure Other     niece  . High blood pressure    . Lung cancer    . Breast cancer Neg Hx   . Colon cancer Neg Hx   . Stroke Mother   . HIV/AIDS Brother     died of AIDS    Social History:  reports that she has been smoking Cigarettes.  She started smoking about 9 months ago. She has been smoking about 0.50 packs per day. She has never used smokeless tobacco. She reports that she does not drink alcohol or use illicit drugs.  Additional Social History:  Alcohol / Drug Use Pain Medications: See MAR  Prescriptions: See MAR  Over the Counter: See MAR  History of alcohol / drug use?: Yes Longest period of sobriety (when/how long): Pt has past hx of crack/cocaine and alcohol absue--last use was 3 mos ago   CIWA: CIWA-Ar BP: 119/86 mmHg Pulse Rate: 112 COWS:    PATIENT STRENGTHS: (choose at least  two) Communication skills  Allergies:  Allergies  Allergen Reactions  . Trazodone And Nefazodone Other (See Comments)    "Gives me bad dreams"  . Morphine And Related Hives and Rash    Home Medications:  (Not in a hospital admission)  OB/GYN Status:  No LMP recorded. Patient is postmenopausal.  General Assessment Data Location of Assessment: [facility] ED TTS Assessment: In system Is this a Tele or Face-to-Face Assessment?: Tele Assessment Is this an Initial Assessment or a Re-assessment for this encounter?: Initial Assessment Marital status: Divorced [place] name: None  Is patient pregnant?: No Pregnancy Status: No Living Arrangements: Other relatives (Lives with daughter ) Can pt return to current living arrangement?: Yes Admission Status: Voluntary Is patient capable of signing voluntary admission?: Yes Referral Source: MD Insurance type: MCR/MCD  Medical Screening Exam [facility] Walk-in ONLY) Medical Exam completed: No Reason for MSE not completed: Other: (None )  Crisis Care Plan Living Arrangements: Other relatives (Lives with daughter ) Name of Psychiatrist: None  Name of Therapist: None   Education Status Is patient currently in school?: No Current Grade: None  Highest grade of school patient has completed: 9 Name of school: None  Contact person: None   Risk to self with the past 6 months Suicidal Ideation: Yes-Currently Present Has patient been a risk to self within the past 6 months prior to admission? : Yes Suicidal Intent: Yes-Currently Present Has patient had any suicidal intent within the past 6 months prior to admission? : Yes Is patient at risk for suicide?: Yes Suicidal Plan?: Yes-Currently Present Has patient had any suicidal plan within the past 6 months prior to admission? : Yes Specify Current Suicidal Plan: Overdose on meds; wheel wheelchair on to highway  Access to Means: Yes Specify Access to Suicidal Means: Pills, traffic  What has been your use  of drugs/alcohol within the last 12 months?: Past hx of crack/cocaine and alcohol  Previous Attempts/Gestures: Yes How many times?: 1 Other Self Harm Risks: None  Triggers for Past Attempts: Other (Comment) (Medical issues ) Intentional Self Injurious Behavior: None Family Suicide History: No Recent stressful life event(s): Recent negative physical changes Persecutory voices/beliefs?: No Depression: Yes Depression Symptoms: Loss of interest in usual pleasures, Feeling angry/irritable Substance abuse history and/or treatment for substance abuse?: Yes Suicide prevention information given to non-admitted patients: Not applicable  Risk to Others within the past 6 months Homicidal Ideation: No Does patient have any lifetime risk of violence toward others  beyond the six months prior to admission? : No Thoughts of Harm to Others: No Current Homicidal Intent: No Current Homicidal Plan: No Access to Homicidal Means: No Identified Victim: None  History of harm to others?: No Assessment of Violence: None Noted Violent Behavior Description: None  Does patient have access to weapons?: No Criminal Charges Pending?: No Does patient have a court date: No Is patient on probation?: No  Psychosis Hallucinations: Visual Delusions: None noted  Mental Status Report Appearance/Hygiene: Disheveled, In hospital gown Eye Contact: Good Motor Activity: Unremarkable Speech: Logical/coherent Level of Consciousness: Alert Mood: Depressed, Sad Affect: Depressed, Appropriate to circumstance, Sad Anxiety Level: None Thought Processes: Coherent, Relevant Judgement: Impaired Orientation: Person, Place, Time, Situation Obsessive Compulsive Thoughts/Behaviors: None  Cognitive Functioning Concentration: Decreased Memory: Recent Intact, Remote Intact IQ: Average Insight: Poor Impulse Control: Poor Appetite: Good Weight Loss: 0 Weight Gain: 0 Sleep: Decreased Total Hours of Sleep: 5 Vegetative  Symptoms: None  ADLScreening [facility] Assessment Services) Patient's cognitive ability adequate to safely complete daily activities?: Yes Patient able to express need for assistance with ADLs?: Yes Independently performs ADLs?: Yes (appropriate for developmental age)  Prior Inpatient Therapy Prior Inpatient Therapy: Yes Prior Therapy Dates: 2001, 2003, 2016 [facility] Reason for Treatment: SI/Depression/SA  Prior Outpatient Therapy Prior Outpatient Therapy: No Prior Therapy Dates: nONE  Prior Therapy Facilty/Provider(s): None  Reason for Treatment: None  Does patient have an ACCT team?: No Does patient have Intensive In-House Services?  : No Does patient have [facility] services? : No Does patient have P4CC services?: No  ADL Screening (condition at time of admission) Patient's cognitive ability adequate to safely complete daily activities?: Yes Is the patient deaf or have difficulty hearing?: No Does the patient have difficulty seeing, even when wearing glasses/contacts?: No Does the patient have difficulty concentrating, remembering, or making decisions?: No Patient able to express need for assistance with ADLs?: Yes Does the patient have difficulty dressing or bathing?: No Independently performs ADLs?: Yes (appropriate for developmental age) Does the patient have difficulty walking or climbing stairs?: No Weakness of Legs: None Weakness of Arms/Hands: None  Home Assistive Devices/Equipment Home Assistive Devices/Equipment: Eyeglasses  Therapy Consults (therapy consults require a physician order) PT Evaluation Needed: No OT Evalulation Needed: No SLP Evaluation Needed: No Abuse/Neglect Assessment (Assessment to be complete while patient is alone) Physical Abuse: Denies Verbal Abuse: Denies Sexual Abuse: Denies Exploitation of patient/patient's resources: Denies Self-Neglect: Denies Values / Beliefs Cultural Requests During Hospitalization:  None Spiritual Requests During Hospitalization: None Consults Spiritual Care Consult Needed: No Social Work Consult Needed: No Merchant navy officerAdvance Directives (For Healthcare) Does patient have an advance directive?: No Would patient like information on creating an advanced directive?: No - patient declined information    Additional Information 1:1 In Past 12 Months?: No CIRT Risk: No Elopement Risk: No Does patient have medical clearance?: Yes     Disposition:  Disposition Initial Assessment Completed for this Encounter: Yes Disposition of Patient: Inpatient treatment program, Referred to (Per Donell SievertSpencer Simon, PA meets criteria for inpt admission ) Type of inpatient treatment program: Adult Patient referred to: Other (Comment) (Per Donell SievertSpencer Simon, PA meets criteria for inpt admission )  Murrell ReddenSimmons, Jerzie Bieri C 03/01/2015 10:04 PM

## 2015-03-01 NOTE — ED Notes (Signed)
Pt requesting HIV medications; reports not having any home medications today. [place] [facility] "and my medications are in my clothes at the hotel with my walker and wheelchair." Spoke to ParajeMercedes PA for psych orders. Pt also reporting "When I close my eyes, I see dead people."

## 2015-03-02 DIAGNOSIS — Z992 Dependence on renal dialysis: Secondary | ICD-10-CM | POA: Diagnosis not present

## 2015-03-02 MED ORDER — PANTOPRAZOLE SODIUM 40 MG PO TBEC
40.0000 mg | DELAYED_RELEASE_TABLET | Freq: Every day | ORAL | Status: DC
  Administered 2015-03-02 – 2015-03-04 (×3): 40 mg via ORAL
  Filled 2015-03-02 (×3): qty 1

## 2015-03-02 NOTE — ED Notes (Signed)
Patient resting on stretcher, sitter at bedside, pt calm, cooperative, no needs at this time.

## 2015-03-02 NOTE — ED Notes (Signed)
Dinner tray at bedside

## 2015-03-02 NOTE — ED Notes (Signed)
Pt asked for 2nd meal bag after having dinner and meal bag; advised pt that we can not keep giving her bag after bag of food; spoke with pt about Renal diet; pt states she is not on a diet; Pt states she is no longer SI and would like to go home if we can not feed her; Pt given policy on snack times; Pt is agitated at this time.

## 2015-03-02 NOTE — ED Notes (Signed)
Pt requesting sleeping medication. Offered benadryl per orders, pt requesting something different. Refused to take Remus Lofflerambien, due to sister having hallucinations. Pt also stating "I just want to go to sleep and not wake up. This is no life to live. I'm tired of all this. I just want to die." Pt expressing concerns for need of local dialysis center; reports having a Child psychotherapistsocial worker and pt's PCP attempting to set up a facility. Pt admits to having multiple arguments with staff at dialysis center and "they just won't take me back"

## 2015-03-02 NOTE — ED Notes (Signed)
Pt refused all night meds; she states she will not be able to sleep if she takes med; pt was very augmentative with Charity fundraiserN;

## 2015-03-02 NOTE — ED Notes (Signed)
The patient is resting with sitter at the bedside. 

## 2015-03-02 NOTE — ED Notes (Signed)
Pt reports pain has improved with heating pack. Pt watching TV with sitter at bedside

## 2015-03-02 NOTE — ED Notes (Signed)
Pt Sleeping and sitter at the bedside.

## 2015-03-02 NOTE — Progress Notes (Signed)
[facility] with [facility] at [facility] (where their gero psych unit can accomodate dialysis pts). Faxed referral for gero psych.  [facility] who states pt was declined by MD for medical acuity.  [address] [facility], [facility], [facility], [facility], [facility] state they cannot review referral as they cannot accomodate pt's dialysis needs.  [facility] and [facility] at capacity.   Ilean SkillMeghan Irvine Glorioso, MSW, LCSWA Clinical Social Work, Disposition  03/02/2015 [phone]

## 2015-03-02 NOTE — ED Notes (Signed)
Pt given another snack

## 2015-03-03 DIAGNOSIS — Z992 Dependence on renal dialysis: Secondary | ICD-10-CM | POA: Diagnosis not present

## 2015-03-03 LAB — CBC
HCT: 30.6 % — ABNORMAL LOW (ref 36.0–46.0)
Hemoglobin: 9.8 g/dL — ABNORMAL LOW (ref 12.0–15.0)
MCH: 34.8 pg — ABNORMAL HIGH (ref 26.0–34.0)
MCHC: 32 g/dL (ref 30.0–36.0)
MCV: 108.5 fL — ABNORMAL HIGH (ref 78.0–100.0)
Platelets: 242 10*3/uL (ref 150–400)
RBC: 2.82 MIL/uL — ABNORMAL LOW (ref 3.87–5.11)
RDW: 13.6 % (ref 11.5–15.5)
WBC: 8 10*3/uL (ref 4.0–10.5)

## 2015-03-03 LAB — RENAL FUNCTION PANEL
ANION GAP: 14 (ref 5–15)
Albumin: 3 g/dL — ABNORMAL LOW (ref 3.5–5.0)
BUN: 51 mg/dL — AB (ref 6–20)
CO2: 25 mmol/L (ref 22–32)
CREATININE: 9.02 mg/dL — AB (ref 0.44–1.00)
Calcium: 6.9 mg/dL — ABNORMAL LOW (ref 8.9–10.3)
Chloride: 97 mmol/L — ABNORMAL LOW (ref 101–111)
GFR calc Af Amer: 5 mL/min — ABNORMAL LOW (ref >60)
GFR calc non Af Amer: 4 mL/min — ABNORMAL LOW (ref >60)
Glucose, Bld: 119 mg/dL — ABNORMAL HIGH (ref 65–99)
Phosphorus: 5.4 mg/dL — ABNORMAL HIGH (ref 2.5–4.6)
Potassium: 5.2 mmol/L — ABNORMAL HIGH (ref 3.5–5.1)
Sodium: 136 mmol/L (ref 135–145)

## 2015-03-03 MED ORDER — MELATONIN 3 MG PO TABS
3.0000 mg | ORAL_TABLET | Freq: Once | ORAL | Status: AC
  Administered 2015-03-03: 3 mg via ORAL
  Filled 2015-03-03: qty 1

## 2015-03-03 MED ORDER — MELATONIN 3 MG PO CAPS
3.0000 mg | ORAL_CAPSULE | Freq: Once | ORAL | Status: DC
  Filled 2015-03-03: qty 1

## 2015-03-03 MED ORDER — DIPHENHYDRAMINE HCL 25 MG PO CAPS
25.0000 mg | ORAL_CAPSULE | Freq: Once | ORAL | Status: AC
  Administered 2015-03-03: 25 mg via ORAL
  Filled 2015-03-03: qty 1

## 2015-03-03 NOTE — Progress Notes (Addendum)
Writer received call from [facility] inqu[facility] about efforts to find placement for patient. RN Burna MortimerWanda was informed that patient is being reviewed at several hospitals. Per Assistant Director LCSW Wandra MannanZack Brooks, if patient does meet inpatient criteria, CSW to contact Dr. Jacky KindleAronson in am.  PA-C Donell SievertSimon Spencer reviewed pt's case anticipating [facility] [address], room with handicapped bathroom facility, pt requiring 1:1 observation due to medical acuity, and [facility].  CSW to follow up in am to coordinate patient's placement at [facility] or other facilities.  Melbourne Abtsatia Jinelle Butchko, LCSWA Disposition staff 03/03/2015 8:53 PM

## 2015-03-03 NOTE — ED Notes (Signed)
Pt returned from dialysis

## 2015-03-03 NOTE — ED Notes (Signed)
RN pressing call bell excessive amount of times within just a few minutes. Pt yelling for sugar and salt. RN spoke calmly with pt and informed her she is on a renal diet. Pt states she will not eat her breakfast and wants a Malawiturkey sandwich and new cup of coffee. RN set limits with pt and states this is a new day- we will respect each other, RN will give pt one new cup of coffee and Malawiturkey sandwich. However, RN informed pt she will not be calling out all morning for food. Pt agreed.

## 2015-03-03 NOTE — ED Notes (Signed)
Pt eating meal tray. Resting comfotabley

## 2015-03-03 NOTE — ED Notes (Signed)
Pt sleeping with sitter at bedside 

## 2015-03-03 NOTE — Progress Notes (Signed)
Chart [facility]. Patient continues to endorse SI along with verbal outburst, emotional instability, volatility and demonstrated lack of insight. Difficulty with patient placement due to medical acuity [facility] [address], patient will need 1:1 observation, handicapped accessible bathroom and no roommate order. Consult with nephrology service [facility]. Plan of care discussed with TTS staff and [facility].   Thanks  Kayli Beal E. Melvenia BeamSimon, PA- C

## 2015-03-03 NOTE — Progress Notes (Signed)
Continuing with placement efforts:  At capacity: [facility]- per [facility]- per Carlyon Shadow (referral is kept for review in case of d/cs) [facility]- per [facility]- per Bary Richard referral to: [facility]- per Pamala Hurry (included 4 page referral form) [facility]- per [facility]- per Tama Gander- per Nunzio Cory, referral received yesterday but not yet reviewed (note that previous referral was met with declination d/t dialysis)  Cannot accept referral d/t pt's dialysis needs: [facility]  Declined: [facility]- d/t medical acuity  Sharren Bridge, MSW, [place] Work, Disposition  03/03/2015 [phone]

## 2015-03-03 NOTE — ED Notes (Signed)
Pt called out for food; Pt very agitated and verbally abrusive to sitter and RN; pt states she wants night meds; RN advised that night meds was brought a couple of hours ago however she refused; pt started screaming and states she wants other meds that are not in her med rec; RN left the room because pt continues to be verbally abusive and cursing at RN;[employer] on stand by;

## 2015-03-03 NOTE — ED Notes (Signed)
Pt watching TV resting comfortably in bed

## 2015-03-03 NOTE — Care Management (Signed)
[facility] concerning update on placement process, still awaiting placement 3 referral pending as per [facility]. No further CM needs identified.

## 2015-03-03 NOTE — ED Notes (Signed)
Spoke with dialysis, pt to be transported there now by MaramecJames, NT

## 2015-03-03 NOTE — Procedures (Signed)
I was present at this dialysis session. I have reviewed the session itself and made appropriate changes.   Sabra Heckyan [name]  MD 03/03/2015, 2:49 PM

## 2015-03-03 NOTE — Progress Notes (Addendum)
CSW followed up referral at: [facility] - per Baird Lyonsasey, in the ER right now, call back in a few hours. Called back at 22 - no answer. [facility] - per [place], did not have a chance to look at referrals, adult female beds open only. [facility] - per PauldenDee, patient declined due to dialysis. [place] - per Victorino DikeJennifer, call back in 30 min. Per Victorino DikeJennifer, declined due to dialysis. Will have beds on Friday. [facility] - per PrestonNicky, still at capacity, d/c in am, call back in am to have a full estimate of beds open.  Mel[place] Abtsatia Shanikka Wonders, LCSWA Disposition staff 03/03/2015 10:07 PM

## 2015-03-04 DIAGNOSIS — Z992 Dependence on renal dialysis: Secondary | ICD-10-CM | POA: Diagnosis not present

## 2015-03-04 NOTE — ED Notes (Signed)
[facility] contacted to transport patient to home address 

## 2015-03-04 NOTE — Progress Notes (Signed)
Patient has all housing resources at this time that are available. Patient also has a Tourist information centre managercommunity social worker working on housing for patient. Patient is not appropriate for placement at this time.  No other needs at this time.  Jean EmoryHannah Zyon Grout LCSW, MSW Clinical Social Work: Emergency Room [phone]

## 2015-03-04 NOTE — Discharge Instructions (Signed)
Depression  Use the resources provided to you by the social worker for additional assistance and planning. I resource guide is also attached for other resources for medical care.   Depression is feeling sad, low, down in the dumps, blue, gloomy, or empty. In general, there are two kinds of depression:  Normal sadness or grief. This can happen after something upsetting. It often goes away on its own within 2 weeks. After losing a loved one (bereavement), normal sadness and grief may last longer than two weeks. It usually gets better with time.  Clinical depression. This kind lasts longer than normal sadness or grief. It keeps you from doing the things you normally do in life. It is often hard to function at home, work, or at school. It may affect your relationships with others. Treatment is often needed. GET HELP RIGHT AWAY IF:  You have thoughts about hurting yourself or others.  You lose touch with reality (psychotic symptoms). You may:  See or hear things that are not real.  Have untrue beliefs about your life or people around you.  Your medicine is giving you problems. MAKE SURE YOU:  Understand these instructions.  Will watch your condition.  Will get help right away if you are not doing well or get worse. Document Released: 10/28/2010 Document Revised: 02/09/2014 Document Reviewed: 01/25/2012 [facility] Patient Information 2015 [place], [place]. This information is not intended to replace advice given to you by your health care provider. Make sure you discuss any questions you have with your health care provider.  Emergency Department Resource Guide 1) Find a Doctor and Pay Out of Pocket Although you won't have to find out who is covered by your insurance plan, it is a good idea to ask around and get recommendations. You will then need to call the office and see if the doctor you have chosen will accept you as a new patient and what types of options they offer for patients who are  self-pay. Some doctors offer discounts or will set up payment plans for their patients who do not have insurance, but you will need to ask so you aren't surprised when you get to your appointment.  2) Contact Your Local Health Department Not all health departments have doctors that can see patients for sick visits, but many do, so it is worth a call to see if yours does. If you don't know where your local health department is, you can check in your phone book. The CDC also has a tool to help you locate your state's health department, and many state websites also have listings of all of their local health departments.  3) Find a Walk-in Clinic If your illness is not likely to be very severe or complicated, you may want to try a walk in clinic. These are popping up all over the country in pharmacies, drugstores, and shopping centers. They're usually staffed by nurse practitioners or physician assistants that have been trained to treat common illnesses and complaints. They're usually fairly quick and inexpensive. However, if you have serious medical issues or chronic medical problems, these are probably not your best option.  No Primary Care Doctor: - Call Health Connect at  [phone] - they can help you locate a primary care doctor that  accepts your insurance, provides certain services, etc. - Physician Referral Service- [phone]  Chronic Pain Problems: Organization         Address  Phone   Notes  [facility]  [phone]  Patients need to be referred by their primary care doctor.   Medication Assistance: Organization         Address  Phone   Notes  [facility] Medication [facility] [address] [place] [place], [place] [address] [phone] --Must be a resident of [facility] -- Must have NO insurance coverage whatsoever (no Medicaid/ Medicare, etc.) -- The pt. MUST have a primary care doctor that directs their care regularly and follows them in  the community   [facility]  [phone]   [employer]  [phone]    Agencies that provide inexpensive medical care: Organization         Address  Phone   Notes  [facility]  [phone]   [facility]    [phone]   [facility] [address] [place], [place] [address] [phone]   [facility] [address], [place] [phone]   Planned Parenthood    [phone]   [facility]    [phone]   [facility]  [address], [place] Phone:  [phone], Fax:  [phone] Hours of Operation:  9 am - 6 pm, M-F.  Also accepts Medicaid/Medicare and self-pay.  [facility]  [address], [place] Phone: [phone], Fax: [phone]. Hours of Operation:  8:30 am - 5:30 pm, M-F.  Also accepts Medicaid and self-pay.  [facility] [place] [address], [place] Phone: [phone]   [facility] [address] Natasha Bence [place], [place] [phone], Ext. [phone] Mondays & Thursdays: 7-9 AM.  First 15 patients are seen on a first come, first serve basis.    Medicaid-accepting [facility] Providers:  Organization         Address  Phone   Notes  [facility] [address], [place] [phone] Also accepts self-pay patients.  [facility] [address] Laurell Josephs [place], [place]  [phone]   [facility] [address], [place] [phone]   [facility] [address] [place] [phone]   Renaye Rakers [address], [place]   [phone] Only accepts [place] Access [place] patients after they have their name applied to their card.   Self-Pay (no insurance) in [facility]:  Organization         Address  Phone   Notes  Sickle Cell Patients,  [facility] [address] [place], [place] [phone]   [facility] [address] [place], [place] [phone]   [facility] [place]  [address], [place] [phone]   [facility]/Dr. Osei-Bonsu  [address], [place] or [address], [place] [phone] Phone number for both [place] and [place] locations is the same.  [facility] [address], [place] [phone]   [facility] [address], [place] or [address] [phone]   [facility] [address] [place] [phone], phone; [phone], fax Sees patients 1st and 3rd Saturday of every month.  Must not qualify for public or private insurance (i.e. Medicaid, Medicare, [place] Health Choice, Veterans' Benefits)  Household  income should be no more than 200% of the poverty level The clinic cannot treat you if you are pregnant or think you are pregnant  Sexually transmitted diseases are not treated at the clinic.    Dental Care: Organization         Address  Phone  Notes  [facility] [address], [place] [phone] Accepts children up to age 63 who are enrolled in [place] or [place] Health Choice; pregnant women with a Medicaid card; and children who have applied for Medicaid or [employer] Health Choice, but were declined, whose parents can pay a reduced fee at time of service.  [facility] [place]  [address], [place] [phone] Accepts children up to age 63 who are enrolled in [place] or [place] Health Choice; pregnant women with a Medicaid card; and children who have applied for Medicaid or [place] Health Choice, but were declined, whose parents can pay a reduced fee at time of service.  [facility]   [address], [place] [phone] Patients are seen by appointment only. Walk-ins are not accepted. [facility] will see patients 918 years of age and older. Monday - Tuesday (8am-5pm) Most Wednesdays (8:30-5pm) $30 per visit, cash only  [facility]  [address], [place] [phone] Patients are seen by appointment only. Walk-ins are not accepted. [facility] will see patients 63 years of age and older. One Wednesday Evening (Monthly: Volunteer Based).  $30 per visit, cash only  [employer] [facility] [facility]  [phone] for adults; Children under age 674, call Graduate Pediatric Dentistry at [phone]. Children aged 444-14, please call [phone] to request a pediatric application.  Dental services are provided in all areas of dental care including fillings, crowns and bridges, complete and partial dentures, implants, gum treatment, root canals, and extractions. Preventive care is also provided. Treatment is provided to both adults and children. Patients are selected via a lottery and there is often a waiting list.   [facility] [address], [place]  [phone] [email or web address]   [facility] [address], [place], [place] [phone], Ext. [phone] Second and Fourth Thursday of each month, opens at 6:30 AM; Clinic ends at 9 AM.  Patients are seen on a first-come first-served basis, and a limited number are seen during each clinic.   [facility]  [address], [place], [place] [phone]   Eligibility Requirements You must have lived in [place], [place], or [place] counties for at least the last three months.   You cannot be eligible for state or federal sponsored National [place]healthcare insurance, including CIG[employer]Veterans Administration, [place]icaid, or [employer]icare.   You generally cannot be eligible for healthcare insurance through your employer.    How to apply: Eligibility screenings are  held every Tuesday and Wednesday afternoon from 1:00 pm until 4:00 pm. You do not need an appointment for the interview!  [facility] [address], [place], [place] [phone]   [facility]  [phone]   [facility]  [phone]   [facility]  [phone]    Behavioral Health Resources in the Community: Intensive Outpatient Programs Organization         Address  Phone  Notes  [facility] [address], [place], [place]  [phone]   [facility] [address], [place], [place] [phone]   [facility] [address] [place], [place]  [phone]   [facility] Mental [facility] [address]  [place], [place] [phone] or [phone]   Substance Abuse Resources Organization         Address  Phone  Notes  [facility]  [phone]   [facility]  [phone]   [facility] [place]  [phone]   Floydene Flock  [phone]   Residential & Outpatient Substance Abuse Program  [phone]   Psychological Services Organization         Address  Phone  Notes  [facility]  [phone]   [facility]  [phone]   [facility] [address] [place] [phone] or [phone]    Mobile Crisis Teams Organization         Address  Phone  Notes  [facility], Mobile Crisis Care Unit  [phone]   [facility]  [address] [place], [place] [phone]   Doristine Locks [address] [place] [phone]    Self-Help/Support Groups Organization         Address  Phone             Notes  [facility] [place] - variety of support groups  [phone]- I7437963 Call for more information  Narcotics Anonymous (NA), [facility] [address], [employer] [place]  2 meetings at this location     Statistician         Address  Phone  Notes  [facility] [address],    [place]  [phone]   [facility]  [address], [place] [address], [place], [place] [phone]   [facility] [address] [place], [place] [phone] Admissions: 8am-3pm M-F  [facility] [address],    [place], [place] [phone]   [facility] [address] [place], [place], [place] [phone]   [facility] [address],  [place], [place] [phone]   [facility] - Intensive Outpatient [address] Laurell Josephs [address], [place], [place] [phone]   [facility] [address]  [place], [place] [phone] or [phone]   [facility] ([facility]) [address], [place], [place] [phone] Accepts Medicaid  [facility] [address]  [place] [phone] Substance Abuse/Addiction Treatment   [facility] Organization         Address  Phone  Notes  [facility]  [phone]   Angie Fava, PhD [address] Ervin Knack [place], [place]   [phone] or [phone]   [facility]   [address] [place], [place] [phone]   [facility] [address] [place], [place] [phone] Insurance/Medicaid/sponsorship through [employer] [place] and Families [address]                                    [place], [place] [phone] Therapy/tele-psych/case  [facility] [address], [place] [phone]    Dr. Lolly Mustache  [phone]   [facility]  [employer] [facility] Dept. 1) [address] [place] 2) [address] [facility]  [address], [place] 3)  [address], [place] [phone]  [phone]   [facility] Child Abuse Hotline 272-490-6546 or (219)010-2179 (After  Hours)

## 2015-03-04 NOTE — ED Notes (Signed)
Patient is asleep with sitter at the bedside. 

## 2015-03-04 NOTE — ED Notes (Signed)
  Called pharmacy for pt meds

## 2015-03-04 NOTE — Progress Notes (Signed)
Spoke with SW Chiropodistassistant director and [facility] SW. Pt has been placed in ALF several times by social work. Is not requesting ALF placement at this time but states she wishes to leave her daughter's home. SW to provide housing resource list as needed. No barriers to d/c at this time.  Ilean SkillMeghan Nissa Stannard, MSW, LCSWA Clinical Social Work, Disposition  03/04/2015 [phone]

## 2015-03-04 NOTE — BH Assessment (Signed)
Writer reassessed patient.  Patient now denies SI.  Patient reports that she just, "does not want to live with her daughter and her boyfriend"  Patient reports that she was living with her daughter but her daughter lost her apartment and now she and her daughter are living with her daughters boyfriend.  Patient denies HI/Psychosis/Substance abuse.    Patient is now requesting assistance with housing and a different place to live.  Writer will consult with Dr Lucianne MussKumar regarding the patients disposition. Per, Dr. Lucianne MussKumar the patient is psychiatrically cleared and can be discharged.  CSW will follow up with housing resources for the patient. Writer informed the CSW Aundra MilletMegan who will follow up with the Child psychotherapistsocial worker at [facility].

## 2015-03-04 NOTE — ED Notes (Signed)
Patient asleep with sitter at the bedside.

## 2015-03-05 ENCOUNTER — Emergency Department (HOSPITAL_COMMUNITY): Payer: Medicare Other

## 2015-03-05 ENCOUNTER — Emergency Department (HOSPITAL_COMMUNITY)
Admission: EM | Admit: 2015-03-05 | Discharge: 2015-03-06 | Disposition: A | Discharge status: Home / Self Care | Payer: Medicare Other | Attending: Emergency Medicine | Admitting: Emergency Medicine

## 2015-03-05 DIAGNOSIS — D649 Anemia, unspecified: Secondary | ICD-10-CM | POA: Diagnosis not present

## 2015-03-05 DIAGNOSIS — M20001 Unspecified deformity of right finger(s): Secondary | ICD-10-CM | POA: Insufficient documentation

## 2015-03-05 DIAGNOSIS — Z8701 Personal history of pneumonia (recurrent): Secondary | ICD-10-CM | POA: Diagnosis not present

## 2015-03-05 DIAGNOSIS — Z992 Dependence on renal dialysis: Secondary | ICD-10-CM | POA: Diagnosis not present

## 2015-03-05 DIAGNOSIS — E875 Hyperkalemia: Secondary | ICD-10-CM | POA: Diagnosis not present

## 2015-03-05 DIAGNOSIS — N186 End stage renal disease: Secondary | ICD-10-CM | POA: Insufficient documentation

## 2015-03-05 DIAGNOSIS — F329 Major depressive disorder, single episode, unspecified: Secondary | ICD-10-CM | POA: Insufficient documentation

## 2015-03-05 DIAGNOSIS — Z720 Tobacco use: Secondary | ICD-10-CM | POA: Insufficient documentation

## 2015-03-05 DIAGNOSIS — Z21 Asymptomatic human immunodeficiency virus [HIV] infection status: Secondary | ICD-10-CM | POA: Insufficient documentation

## 2015-03-05 DIAGNOSIS — Z79899 Other long term (current) drug therapy: Secondary | ICD-10-CM | POA: Diagnosis not present

## 2015-03-05 DIAGNOSIS — K219 Gastro-esophageal reflux disease without esophagitis: Secondary | ICD-10-CM | POA: Insufficient documentation

## 2015-03-05 DIAGNOSIS — I120 Hypertensive chronic kidney disease with stage 5 chronic kidney disease or end stage renal disease: Secondary | ICD-10-CM | POA: Insufficient documentation

## 2015-03-05 DIAGNOSIS — I509 Heart failure, unspecified: Secondary | ICD-10-CM | POA: Diagnosis not present

## 2015-03-05 DIAGNOSIS — S62634S Displaced fracture of distal phalanx of right ring finger, sequela: Secondary | ICD-10-CM | POA: Insufficient documentation

## 2015-03-05 DIAGNOSIS — IMO0001 Reserved for inherently not codable concepts without codable children: Secondary | ICD-10-CM

## 2015-03-05 DIAGNOSIS — M1990 Unspecified osteoarthritis, unspecified site: Secondary | ICD-10-CM | POA: Insufficient documentation

## 2015-03-05 DIAGNOSIS — M79644 Pain in right finger(s): Secondary | ICD-10-CM | POA: Diagnosis present

## 2015-03-05 DIAGNOSIS — S62629S Displaced fracture of medial phalanx of unspecified finger, sequela: Secondary | ICD-10-CM

## 2015-03-05 LAB — I-STAT CHEM 8, ED
BUN: 44 mg/dL — ABNORMAL HIGH (ref 6–20)
CALCIUM ION: 0.93 mmol/L — AB (ref 1.13–1.30)
CHLORIDE: 98 mmol/L — AB (ref 101–111)
CREATININE: 9 mg/dL — AB (ref 0.44–1.00)
Glucose, Bld: 119 mg/dL — ABNORMAL HIGH (ref 65–99)
HEMATOCRIT: 34 % — AB (ref 36.0–46.0)
Hemoglobin: 11.6 g/dL — ABNORMAL LOW (ref 12.0–15.0)
Potassium: 5.5 mmol/L — ABNORMAL HIGH (ref 3.5–5.1)
SODIUM: 133 mmol/L — AB (ref 135–145)
TCO2: 24 mmol/L (ref 0–100)

## 2015-03-05 MED ORDER — FLUOXETINE HCL 20 MG PO CAPS
20.0000 mg | ORAL_CAPSULE | Freq: Every day | ORAL | Status: DC
  Administered 2015-03-05: 20 mg via ORAL
  Filled 2015-03-05 (×2): qty 1

## 2015-03-05 MED ORDER — RENA-VITE PO TABS
1.0000 | ORAL_TABLET | Freq: Every day | ORAL | Status: DC
  Administered 2015-03-05: 1 via ORAL
  Filled 2015-03-05 (×2): qty 1

## 2015-03-05 MED ORDER — RALTEGRAVIR POTASSIUM 400 MG PO TABS
400.0000 mg | ORAL_TABLET | Freq: Two times a day (BID) | ORAL | Status: DC
  Administered 2015-03-05: 400 mg via ORAL
  Filled 2015-03-05 (×3): qty 1

## 2015-03-05 MED ORDER — ACETAMINOPHEN 325 MG PO TABS
650.0000 mg | ORAL_TABLET | Freq: Once | ORAL | Status: AC
  Administered 2015-03-05: 650 mg via ORAL
  Filled 2015-03-05: qty 2

## 2015-03-05 MED ORDER — LAMIVUDINE 5 MG/ML PO SOLN: 25.0000 mg | Freq: Every day | ORAL | Status: DC

## 2015-03-05 MED ORDER — HYDRALAZINE HCL 25 MG PO TABS
25.0000 mg | ORAL_TABLET | Freq: Three times a day (TID) | ORAL | Status: DC
  Administered 2015-03-05: 25 mg via ORAL
  Filled 2015-03-05 (×5): qty 1

## 2015-03-05 MED ORDER — TIOTROPIUM BROMIDE MONOHYDRATE 18 MCG IN CAPS
18.0000 ug | ORAL_CAPSULE | Freq: Every day | RESPIRATORY_TRACT | Status: DC
  Filled 2015-03-05: qty 5

## 2015-03-05 MED ORDER — HYDROCODONE-ACETAMINOPHEN 5-325 MG PO TABS
1.0000 | ORAL_TABLET | Freq: Once | ORAL | Status: AC
  Administered 2015-03-05: 1 via ORAL
  Filled 2015-03-05: qty 1

## 2015-03-05 MED ORDER — TENOFOVIR DISOPROXIL FUMARATE 300 MG PO TABS: 300.0000 mg | ORAL_TABLET | ORAL | Status: DC

## 2015-03-05 MED ORDER — AMLODIPINE BESYLATE 5 MG PO TABS
10.0000 mg | ORAL_TABLET | Freq: Every day | ORAL | Status: DC
  Administered 2015-03-05: 10 mg via ORAL
  Filled 2015-03-05: qty 2

## 2015-03-05 MED ORDER — CALCITRIOL 0.5 MCG PO CAPS
2.0000 ug | ORAL_CAPSULE | Freq: Every day | ORAL | Status: DC
  Administered 2015-03-05: 2 ug via ORAL
  Filled 2015-03-05 (×2): qty 4

## 2015-03-05 MED ORDER — TENOFOVIR DISOPROXIL FUMARATE 300 MG PO TABS
300.0000 mg | ORAL_TABLET | ORAL | Status: DC
  Administered 2015-03-05: 300 mg via ORAL
  Filled 2015-03-05: qty 1

## 2015-03-05 MED ORDER — ISOSORBIDE MONONITRATE ER 30 MG PO TB24
30.0000 mg | ORAL_TABLET | Freq: Every day | ORAL | Status: DC
  Administered 2015-03-05: 30 mg via ORAL
  Filled 2015-03-05 (×2): qty 1

## 2015-03-05 MED ORDER — PANTOPRAZOLE SODIUM 40 MG PO TBEC
40.0000 mg | DELAYED_RELEASE_TABLET | Freq: Every day | ORAL | Status: DC
  Administered 2015-03-05: 40 mg via ORAL
  Filled 2015-03-05: qty 1

## 2015-03-05 MED ORDER — SEVELAMER CARBONATE 800 MG PO TABS
2400.0000 mg | ORAL_TABLET | Freq: Three times a day (TID) | ORAL | Status: DC
  Filled 2015-03-05 (×4): qty 3

## 2015-03-05 MED ORDER — ALBUTEROL SULFATE HFA 108 (90 BASE) MCG/ACT IN AERS: 2.0000 | INHALATION_SPRAY | Freq: Four times a day (QID) | RESPIRATORY_TRACT | Status: DC | PRN

## 2015-03-05 MED ORDER — DIALYVITE 3000 3 MG PO TABS: 1.0000 | ORAL_TABLET | Freq: Every day | ORAL | Status: DC

## 2015-03-05 MED ORDER — LAMIVUDINE 10 MG/ML PO SOLN
25.0000 mg | Freq: Every day | ORAL | Status: DC
  Administered 2015-03-05: 25 mg via ORAL
  Filled 2015-03-05 (×2): qty 5

## 2015-03-05 MED ORDER — CINACALCET HCL 30 MG PO TABS
90.0000 mg | ORAL_TABLET | Freq: Every day | ORAL | Status: DC
  Filled 2015-03-05 (×2): qty 3

## 2015-03-05 NOTE — ED Notes (Signed)
Meal Tray ordered for patient.

## 2015-03-05 NOTE — ED Notes (Addendum)
Pt presents with pain to right ring finger. Pt originally her fingered on 5.11 with an alteracation with police. Per pt the finger had an increase in pain at 2300 on 6.26. Pt also is requestting dialysis at this time,.

## 2015-03-05 NOTE — ED Provider Notes (Signed)
CSN: [id number]     Arrival date & time 03/05/15  0736 History   First MD Initiated Contact with Patient 03/05/15 0745     Chief Complaint  Patient presents with  . Finger Injury  . Vascular Access Problem     (Consider location/radiation/quality/duration/timing/severity/associated sxs/prior Treatment) HPI Comments: Patient requesting dialysis. States last dialysis session. Has not missed any sessions. Discharge from the ED yesterday after stay for suicidal ideation. She denies chest pain or shortness of breath. She denies cough or fever. She endorses worsening pain to her right ring finger after injuring it on May 11 and having been diagnosed with a fracture. Denies any bleeding or drainage from the finger. Denies any fever. She has not yet followed up with a hand doctor.  The history is provided by the patient.    Past Medical History  Diagnosis Date  . Hypertension   . HIV infection   . Morbid obesity   . ESRD (end stage renal disease) on dialysis 09/30/2013    [place].  ESRD due to HTN vs drug abuse according to pt.  Was [facility] dialysis [facility] until Feb 2015 when she was admitted to a SNF due to homelessness and drug abuse.  Then changed to [facility] on TTS schedule.     . Anginal pain   . CHF (congestive heart failure)   . Shortness of breath   . Pneumonia   . Depression   . Anxiety   . GERD (gastroesophageal reflux disease)   . Arthritis   . Anemia   . Hyperkalemia 08/11/2014   Past Surgical History  Procedure Laterality Date  . Arteriovenous graft placement      left forearm  . Cardiac catheterization    . Laparotomy      states seh was cut open because seh was bleeding on the inside   Family History  Problem Relation Age of Onset  . Kidney failure Other     niece  . High blood pressure    . Lung cancer    . Breast cancer Neg Hx   . Colon cancer Neg Hx   . Stroke Mother   . HIV/AIDS Brother     died of AIDS    History  Substance Use Topics  . Smoking status: Current Every Day Smoker -- 0.50 packs/day    Types: Cigarettes    Start date: 05/06/2014  . Smokeless tobacco: Never Used     Comment: recently quit  . Alcohol Use: No   OB History    No data available     Review of Systems  Constitutional: Negative for fever, activity change and appetite change.  HENT: Negative for congestion and rhinorrhea.   Eyes: Negative for visual disturbance.  Respiratory: Negative for cough, chest tightness and shortness of breath.   Cardiovascular: Negative for chest pain.  Gastrointestinal: Negative for nausea, vomiting and abdominal pain.  Genitourinary: Negative for dysuria and hematuria.  Musculoskeletal: Positive for myalgias and arthralgias.  Skin: Negative for rash.  Neurological: Negative for dizziness, weakness, light-headedness and numbness.  A complete 10 system review of systems was obtained and all systems are negative except as noted in the HPI and PMH.      Allergies  Trazodone and nefazodone and Morphine and related  Home Medications   Prior to Admission medications   Medication Sig Start Date End Date Taking? Authorizing Provider  albuterol (PROVENTIL HFA;VENTOLIN HFA) 108 (90 BASE) MCG/ACT inhaler Inhale  2 puffs into the lungs every 6 (six) hours as needed for wheezing or shortness of breath.   Yes Historical Provider, MD  amLODipine (NORVASC) 5 MG tablet Take 10 mg by mouth at bedtime.    Yes Historical Provider, MD  calcitRIOL (ROCALTROL) 0.5 MCG capsule Take 2 mcg by mouth daily. Takes on Mon, Wed, and Fri with Dialysis   Yes Historical Provider, MD  cinacalcet (SENSIPAR) 90 MG tablet Take 90 mg by mouth daily.   Yes Historical Provider, MD  colchicine 0.6 MG tablet Take 0.6 mg by mouth daily.   Yes Historical Provider, MD  diphenhydrAMINE (BENADRYL) 25 mg capsule Take 50 mg by mouth at bedtime as needed for sleep.   Yes Historical Provider, MD  FLUoxetine (PROZAC) 20 MG  capsule Take 20 mg by mouth daily.   Yes Historical Provider, MD  folic acid-vitamin b complex-vitamin c-selenium-zinc (DIALYVITE) 3 MG TABS tablet Take 1 tablet by mouth daily.   Yes Historical Provider, MD  hydrALAZINE (APRESOLINE) 25 MG tablet Take 25 mg by mouth every 8 (eight) hours.   Yes Historical Provider, MD  isosorbide mononitrate (IMDUR) 30 MG 24 hr tablet Take 30 mg by mouth daily.   Yes Historical Provider, MD  lamivudine (EPIVIR-HBV) 5 MG/ML solution Take 5 mLs (25 mg total) by mouth daily. 07/14/14  Yes Gardiner Barefoot, MD  LORazepam (ATIVAN) 0.5 MG tablet Take 0.5 mg by mouth every 12 (twelve) hours as needed for anxiety.   Yes Historical Provider, MD  omeprazole (PRILOSEC) 20 MG capsule Take 20 mg by mouth daily.   Yes Historical Provider, MD  oxyCODONE-acetaminophen (PERCOCET/ROXICET) 5-325 MG per tablet Take 1-2 tablets by mouth every 6 (six) hours as needed for severe pain. 02/24/15  Yes Gwyneth Sprout, MD  polyethylene glycol (MIRALAX / GLYCOLAX) packet Take 17 g by mouth daily as needed for mild constipation.   Yes Historical Provider, MD  raltegravir (ISENTRESS) 400 MG tablet Take 1 tablet (400 mg total) by mouth 2 (two) times daily. 07/14/14  Yes Gardiner Barefoot, MD  sevelamer carbonate (RENVELA) 800 MG tablet Take 2,400 mg by mouth 3 (three) times daily with meals.   Yes Historical Provider, MD  sevelamer carbonate (RENVELA) 800 MG tablet Take 3 tablets (2,400 mg total) by mouth 3 (three) times daily with meals. 08/14/14  Yes Erasmo Downer, MD  tenofovir (VIREAD) 300 MG tablet Take 1 tablet (300 mg total) by mouth once a week. Give on Friday 07/14/14  Yes Gardiner Barefoot, MD  tiotropium (SPIRIVA) 18 MCG inhalation capsule Place 18 mcg into inhaler and inhale daily.   Yes Historical Provider, MD   BP 182/106 mmHg  Pulse 87  Temp(Src) 99.1 F (37.3 C) (Oral)  Resp 15  Ht  (1.626 m)  Wt 254 lb (115.214 kg)  BMI 43.58 kg/m2  SpO2 97% Physical Exam  Constitutional:  She is oriented to person, place, and time. She appears well-developed and well-nourished. No distress.  HENT:  Head: Normocephalic and atraumatic.  Mouth/Throat: Oropharynx is clear and moist. No oropharyngeal exudate.  Eyes: Conjunctivae and EOM are normal. Pupils are equal, round, and reactive to light.  Neck: Normal range of motion. Neck supple.  No meningismus.  Cardiovascular: Normal rate, regular rhythm, normal heart sounds and intact distal pulses.   No murmur heard. Pulmonary/Chest: Effort normal and breath sounds normal. No respiratory distress.  Abdominal: Soft. There is no tenderness. There is no rebound and no guarding.  Musculoskeletal: She exhibits tenderness. She  exhibits no edema.  R ring finger has edema of middle and distal phalanx.  Abrasion over DIP.  Unable to extend at the DIP joint. Some flexion at DIP joint. Flexion and extension intact of PIP and MCP joints. Ring finger held in mallet finger configuration  Neurological: She is alert and oriented to person, place, and time. No cranial nerve deficit. She exhibits normal muscle tone. Coordination normal.  No ataxia on finger to nose bilaterally. No pronator drift. 5/5 strength throughout. CN 2-12 intact. Negative Romberg. Equal grip strength. Sensation intact. Gait is normal.   Skin: Skin is warm.  Psychiatric: She has a normal mood and affect. Her behavior is normal.  Nursing note and vitals reviewed.   ED Course  Procedures (including critical care time) Labs Review Labs Reviewed  I-STAT CHEM 8, ED - Abnormal; Notable for the following:    Sodium 133 (*)    Potassium 5.5 (*)    Chloride 98 (*)    BUN 44 (*)    Creatinine, Ser 9.00 (*)    Glucose, Bld 119 (*)    Calcium, Ion 0.93 (*)    Hemoglobin 11.6 (*)    HCT 34.0 (*)    All other components within normal limits    Imaging Review Dg Chest 2 View  03/05/2015   CLINICAL DATA:  Shortness of Breath.  Renal failure.  EXAM: CHEST  2 VIEW  COMPARISON:   None.  FINDINGS: The lungs are clear. Heart is upper normal in size with pulmonary vascularity within normal limits. No adenopathy. There is atherosclerotic change in the aortic arch region. There is endplate sclerosis involving multiple vertebral bodies consistent with renal failure.  IMPRESSION: No edema or consolidation.   Electronically Signed   By: Bretta Bang III M.D.   On: 03/05/2015 08:41   Dg Finger Ring Right  03/05/2015   CLINICAL DATA:  Right ring finger injury.  Injured on 02/17/2015.  EXAM: RIGHT RING FINGER 2+V  COMPARISON:  02/18/2015.  FINDINGS: Diffuse soft-tissue swelling . Comminuted displaced fracture of the distal aspect of the middle phalanx of the right fourth digit is present. Displaced fracture fragments are present. Lucency noted about the fracture. This may be related to bony resorption. Possibility of osteomyelitis cannot be completely excluded.  IMPRESSION: Comminuted displaced fracture of the distal aspect of the middle phalanx of the right fourth digit. Lucency noted about the fracture site may be related to bony resorption. Osteomyelitis cannot be excluded .   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 No chest pain or shortness of breath. No missed dialysis sessions.  Her second complaint is pain in her right ring finger after fracture 2 weeks ago. Patient  has been noncompliant with her splint. She has not seen hand surgery. X-ray today shows comminuted fracture again. She does have a mallet finger  deformity on exam. This was discussed with Dr. Orlan Leavens. He agrees with splint. X-ray findings discussed with Dr. Orlan Leavens he does not think this represents osteomyelitis at this time.  Potassium mildly elevated at 5.5. No EKG changes. Chest x-ray negative. Patient no distress.  Discussed with nephrology Dr. Marisue Humble. He will arrange for dialysis through the ED. Despite discharge home after this is complete.  Discussed with patient the importance of wearing her finger splint and close follow-up with hand surgery.   Glynn Octave, MD 03/05/15 [phone]

## 2015-03-05 NOTE — Discharge Instructions (Signed)
Hyperkalemia °Hyperkalemia means you have too much potassium in your blood. Potassium is a type of salt in the blood (electrolyte). Normally, your kidneys remove potassium from the body. Too much potassium can be life-threatening. °HOME CARE °· Only take medicine as told by your doctor. °· Do not take vitamins or natural products unless your doctor says they are okay. °· Keep all doctor visits as told. °· Follow diet instructions as told by your doctor. °GET HELP RIGHT AWAY IF: °· Your heartbeat is not regular or very slow. °· You feel dizzy (lightheaded). °· You feel weak. °· You are short of breath. °· You have chest pain. °· You pass out (faint). °MAKE SURE YOU:  °· Understand these instructions. °· Will watch your condition. °· Will get help right away if you are not doing well or get worse. °Document Released: 09/25/2005 Document Revised: 12/18/2011 Document Reviewed: 12/31/2013 °ExitCare® Patient Information ©2015 [employer]. This information is not intended to replace advice given to you by your health care provider. Make sure you discuss any questions you have with your health care provider. ° °

## 2015-03-05 NOTE — ED Notes (Addendum)
Pt continually calling out- yelling at staff. Pt wants something for pain. RN informed pt that we would not tolerate this behavior. MD notified.

## 2015-03-05 NOTE — ED Notes (Signed)
Meal Tray given 

## 2015-03-05 NOTE — ED Notes (Signed)
RN in to give pt medications- pt still yelling at RN.  Pt given tylenol along with other ordered medications- pt now sts she does not want any of the medications.

## 2015-03-05 NOTE — ED Notes (Signed)
Pt reports pain to finger- MD notified. No new orders at this time. Pt given ice pack.

## 2015-03-05 NOTE — ED Notes (Signed)
Patient transported to X-ray 

## 2015-03-05 NOTE — ED Notes (Signed)
Pt stating she is leaving because she cannot wait all day and not have a way home tonight after dialysis.  MD and RN at bedside encouraging pt to stay and have dialysis.  RN spoke to dialysis and unable to give a time frame.  RN spoke to [place] CSW to help arrange transportation after dialysis.  Pt updated and agrees to plan.  Pt resting in bed.

## 2015-03-05 NOTE — ED Notes (Signed)
Pt called out and stated that she had unhooked herself from the machine, and that she was going to be leaving soon. Made the nurse and tech aware of this.

## 2015-03-06 DIAGNOSIS — M20001 Unspecified deformity of right finger(s): Secondary | ICD-10-CM | POA: Diagnosis not present

## 2015-03-06 MED ORDER — ACETAMINOPHEN 325 MG PO TABS
650.0000 mg | ORAL_TABLET | Freq: Once | ORAL | Status: AC
  Administered 2015-03-06: 650 mg via ORAL

## 2015-03-06 MED ORDER — ACETAMINOPHEN 325 MG PO TABS
ORAL_TABLET | ORAL | Status: AC
  Filled 2015-03-06: qty 2

## 2015-03-06 NOTE — ED Notes (Signed)
Pt. Left with all belongings 

## 2015-03-08 ENCOUNTER — Encounter (HOSPITAL_COMMUNITY): Payer: Self-pay | Admitting: *Deleted

## 2015-03-08 ENCOUNTER — Observation Stay (HOSPITAL_COMMUNITY)
Admission: EM | Admit: 2015-03-08 | Discharge: 2015-03-09 | Disposition: A | Discharge status: Home / Self Care | Payer: Medicare Other | Attending: Internal Medicine | Admitting: Internal Medicine

## 2015-03-08 ENCOUNTER — Emergency Department (HOSPITAL_COMMUNITY): Payer: Medicare Other

## 2015-03-08 DIAGNOSIS — R0602 Shortness of breath: Principal | ICD-10-CM | POA: Insufficient documentation

## 2015-03-08 DIAGNOSIS — Z21 Asymptomatic human immunodeficiency virus [HIV] infection status: Secondary | ICD-10-CM | POA: Insufficient documentation

## 2015-03-08 DIAGNOSIS — M1990 Unspecified osteoarthritis, unspecified site: Secondary | ICD-10-CM | POA: Insufficient documentation

## 2015-03-08 DIAGNOSIS — Z8701 Personal history of pneumonia (recurrent): Secondary | ICD-10-CM | POA: Diagnosis not present

## 2015-03-08 DIAGNOSIS — N186 End stage renal disease: Secondary | ICD-10-CM | POA: Diagnosis not present

## 2015-03-08 DIAGNOSIS — I209 Angina pectoris, unspecified: Secondary | ICD-10-CM | POA: Diagnosis not present

## 2015-03-08 DIAGNOSIS — Z79899 Other long term (current) drug therapy: Secondary | ICD-10-CM | POA: Diagnosis not present

## 2015-03-08 DIAGNOSIS — I5042 Chronic combined systolic (congestive) and diastolic (congestive) heart failure: Secondary | ICD-10-CM | POA: Diagnosis not present

## 2015-03-08 DIAGNOSIS — I120 Hypertensive chronic kidney disease with stage 5 chronic kidney disease or end stage renal disease: Secondary | ICD-10-CM | POA: Insufficient documentation

## 2015-03-08 DIAGNOSIS — Z590 Homelessness: Secondary | ICD-10-CM | POA: Insufficient documentation

## 2015-03-08 DIAGNOSIS — F329 Major depressive disorder, single episode, unspecified: Secondary | ICD-10-CM | POA: Insufficient documentation

## 2015-03-08 DIAGNOSIS — J449 Chronic obstructive pulmonary disease, unspecified: Secondary | ICD-10-CM | POA: Diagnosis not present

## 2015-03-08 DIAGNOSIS — I10 Essential (primary) hypertension: Secondary | ICD-10-CM

## 2015-03-08 DIAGNOSIS — B20 Human immunodeficiency virus [HIV] disease: Secondary | ICD-10-CM | POA: Diagnosis present

## 2015-03-08 DIAGNOSIS — F419 Anxiety disorder, unspecified: Secondary | ICD-10-CM | POA: Insufficient documentation

## 2015-03-08 DIAGNOSIS — Z992 Dependence on renal dialysis: Secondary | ICD-10-CM | POA: Insufficient documentation

## 2015-03-08 DIAGNOSIS — D649 Anemia, unspecified: Secondary | ICD-10-CM | POA: Insufficient documentation

## 2015-03-08 DIAGNOSIS — K219 Gastro-esophageal reflux disease without esophagitis: Secondary | ICD-10-CM | POA: Diagnosis not present

## 2015-03-08 DIAGNOSIS — E875 Hyperkalemia: Secondary | ICD-10-CM | POA: Insufficient documentation

## 2015-03-08 DIAGNOSIS — Z720 Tobacco use: Secondary | ICD-10-CM | POA: Insufficient documentation

## 2015-03-08 DIAGNOSIS — I509 Heart failure, unspecified: Secondary | ICD-10-CM | POA: Diagnosis not present

## 2015-03-08 LAB — CBC WITH DIFFERENTIAL/PLATELET
BASOS ABS: 0.1 10*3/uL (ref 0.0–0.1)
BASOS PCT: 1 % (ref 0–1)
Eosinophils Absolute: 0.2 10*3/uL (ref 0.0–0.7)
Eosinophils Relative: 2 % (ref 0–5)
HCT: 30.5 % — ABNORMAL LOW (ref 36.0–46.0)
HEMOGLOBIN: 9.7 g/dL — AB (ref 12.0–15.0)
Lymphocytes Relative: 32 % (ref 12–46)
Lymphs Abs: 2.6 10*3/uL (ref 0.7–4.0)
MCH: 35.1 pg — ABNORMAL HIGH (ref 26.0–34.0)
MCHC: 31.8 g/dL (ref 30.0–36.0)
MCV: 110.5 fL — ABNORMAL HIGH (ref 78.0–100.0)
Monocytes Absolute: 0.7 10*3/uL (ref 0.1–1.0)
Monocytes Relative: 8 % (ref 3–12)
NEUTROS PCT: 57 % (ref 43–77)
Neutro Abs: 4.6 10*3/uL (ref 1.7–7.7)
PLATELETS: 252 10*3/uL (ref 150–400)
RBC: 2.76 MIL/uL — AB (ref 3.87–5.11)
RDW: 13.5 % (ref 11.5–15.5)
WBC: 8.2 10*3/uL (ref 4.0–10.5)

## 2015-03-08 LAB — BASIC METABOLIC PANEL
ANION GAP: 15 (ref 5–15)
BUN: 60 mg/dL — AB (ref 6–20)
CO2: 22 mmol/L (ref 22–32)
Calcium: 7.9 mg/dL — ABNORMAL LOW (ref 8.9–10.3)
Chloride: 98 mmol/L — ABNORMAL LOW (ref 101–111)
Creatinine, Ser: 11.12 mg/dL — ABNORMAL HIGH (ref 0.44–1.00)
GFR calc Af Amer: 4 mL/min — ABNORMAL LOW (ref >60)
GFR, EST NON AFRICAN AMERICAN: 3 mL/min — AB (ref >60)
Glucose, Bld: 101 mg/dL — ABNORMAL HIGH (ref 65–99)
Potassium: 5.9 mmol/L — ABNORMAL HIGH (ref 3.5–5.1)
Sodium: 135 mmol/L (ref 135–145)

## 2015-03-08 MED ORDER — ALTEPLASE 2 MG IJ SOLR: 2.0000 mg | Freq: Once | INTRAMUSCULAR | Status: DC | PRN

## 2015-03-08 MED ORDER — SEVELAMER CARBONATE 800 MG PO TABS
2400.0000 mg | ORAL_TABLET | Freq: Three times a day (TID) | ORAL | Status: DC
  Filled 2015-03-08 (×3): qty 3

## 2015-03-08 MED ORDER — ACETAMINOPHEN 650 MG RE SUPP: 650.0000 mg | Freq: Four times a day (QID) | RECTAL | Status: DC | PRN

## 2015-03-08 MED ORDER — PENTAFLUOROPROP-TETRAFLUOROETH EX AERO: 1.0000 "application " | INHALATION_SPRAY | CUTANEOUS | Status: DC | PRN

## 2015-03-08 MED ORDER — VITAMIN B-1 100 MG PO TABS
100.0000 mg | ORAL_TABLET | Freq: Every day | ORAL | Status: DC
  Filled 2015-03-08: qty 1

## 2015-03-08 MED ORDER — LIDOCAINE-PRILOCAINE 2.5-2.5 % EX CREA: 1.0000 "application " | TOPICAL_CREAM | CUTANEOUS | Status: DC | PRN

## 2015-03-08 MED ORDER — LIDOCAINE HCL (PF) 1 % IJ SOLN: 5.0000 mL | INTRAMUSCULAR | Status: DC | PRN

## 2015-03-08 MED ORDER — HEPARIN SODIUM (PORCINE) 1000 UNIT/ML DIALYSIS: 1000.0000 [IU] | INTRAMUSCULAR | Status: DC | PRN

## 2015-03-08 MED ORDER — RALTEGRAVIR POTASSIUM 400 MG PO TABS
400.0000 mg | ORAL_TABLET | Freq: Two times a day (BID) | ORAL | Status: DC
  Filled 2015-03-08 (×2): qty 1

## 2015-03-08 MED ORDER — NEPRO/CARBSTEADY PO LIQD: 237.0000 mL | ORAL | Status: DC | PRN

## 2015-03-08 MED ORDER — SODIUM CHLORIDE 0.9 % IJ SOLN: 3.0000 mL | INTRAMUSCULAR | Status: DC | PRN

## 2015-03-08 MED ORDER — OXYCODONE-ACETAMINOPHEN 5-325 MG PO TABS
1.0000 | ORAL_TABLET | Freq: Four times a day (QID) | ORAL | Status: DC | PRN
  Administered 2015-03-09: 1 via ORAL
  Filled 2015-03-08: qty 1

## 2015-03-08 MED ORDER — LAMIVUDINE 5 MG/ML PO SOLN
25.0000 mg | Freq: Every day | ORAL | Status: DC
  Filled 2015-03-08: qty 5

## 2015-03-08 MED ORDER — ISOSORBIDE MONONITRATE ER 30 MG PO TB24
30.0000 mg | ORAL_TABLET | Freq: Every day | ORAL | Status: DC
  Filled 2015-03-08: qty 1

## 2015-03-08 MED ORDER — HEPARIN SODIUM (PORCINE) 1000 UNIT/ML DIALYSIS: 20.0000 [IU]/kg | INTRAMUSCULAR | Status: DC | PRN

## 2015-03-08 MED ORDER — SODIUM CHLORIDE 0.9 % IV SOLN: 250.0000 mL | INTRAVENOUS | Status: DC | PRN

## 2015-03-08 MED ORDER — DIALYVITE 3000 3 MG PO TABS: 1.0000 | ORAL_TABLET | Freq: Every day | ORAL | Status: DC

## 2015-03-08 MED ORDER — ONDANSETRON HCL 4 MG/2ML IJ SOLN: 4.0000 mg | Freq: Four times a day (QID) | INTRAMUSCULAR | Status: DC | PRN

## 2015-03-08 MED ORDER — ADULT MULTIVITAMIN W/MINERALS CH
1.0000 | ORAL_TABLET | Freq: Every day | ORAL | Status: DC
  Filled 2015-03-08: qty 1

## 2015-03-08 MED ORDER — CALCITRIOL 0.5 MCG PO CAPS: 2.0000 ug | ORAL_CAPSULE | Freq: Every day | ORAL | Status: DC

## 2015-03-08 MED ORDER — ALBUTEROL SULFATE (2.5 MG/3ML) 0.083% IN NEBU: 2.5000 mg | INHALATION_SOLUTION | Freq: Four times a day (QID) | RESPIRATORY_TRACT | Status: DC | PRN

## 2015-03-08 MED ORDER — POLYETHYLENE GLYCOL 3350 17 G PO PACK: 17.0000 g | PACK | Freq: Every day | ORAL | Status: DC | PRN

## 2015-03-08 MED ORDER — ACETAMINOPHEN 325 MG PO TABS
650.0000 mg | ORAL_TABLET | Freq: Four times a day (QID) | ORAL | Status: DC | PRN
Start: 2015-03-08 — End: 2015-03-09

## 2015-03-08 MED ORDER — SODIUM CHLORIDE 0.9 % IV SOLN: 100.0000 mL | INTRAVENOUS | Status: DC | PRN

## 2015-03-08 MED ORDER — ONDANSETRON HCL 4 MG PO TABS: 4.0000 mg | ORAL_TABLET | Freq: Four times a day (QID) | ORAL | Status: DC | PRN

## 2015-03-08 MED ORDER — HEPARIN SODIUM (PORCINE) 5000 UNIT/ML IJ SOLN
5000.0000 [IU] | Freq: Three times a day (TID) | INTRAMUSCULAR | Status: DC
  Filled 2015-03-08 (×3): qty 1

## 2015-03-08 MED ORDER — TIOTROPIUM BROMIDE MONOHYDRATE 18 MCG IN CAPS
18.0000 ug | ORAL_CAPSULE | Freq: Every day | RESPIRATORY_TRACT | Status: DC
  Filled 2015-03-08: qty 5

## 2015-03-08 MED ORDER — SODIUM POLYSTYRENE SULFONATE 15 GM/60ML PO SUSP
30.0000 g | Freq: Once | ORAL | Status: AC
  Administered 2015-03-08: 30 g via ORAL
  Filled 2015-03-08: qty 120

## 2015-03-08 MED ORDER — PANTOPRAZOLE SODIUM 40 MG PO TBEC: 40.0000 mg | DELAYED_RELEASE_TABLET | Freq: Every day | ORAL | Status: DC

## 2015-03-08 MED ORDER — FOLIC ACID 1 MG PO TABS
1.0000 mg | ORAL_TABLET | Freq: Every day | ORAL | Status: DC
  Filled 2015-03-08: qty 1

## 2015-03-08 MED ORDER — CINACALCET HCL 30 MG PO TABS
90.0000 mg | ORAL_TABLET | Freq: Every day | ORAL | Status: DC
  Filled 2015-03-08 (×2): qty 3

## 2015-03-08 MED ORDER — AMLODIPINE BESYLATE 10 MG PO TABS
10.0000 mg | ORAL_TABLET | Freq: Every day | ORAL | Status: DC
  Administered 2015-03-09: 10 mg via ORAL
  Filled 2015-03-08 (×2): qty 1

## 2015-03-08 MED ORDER — FLUOXETINE HCL 20 MG PO CAPS
20.0000 mg | ORAL_CAPSULE | Freq: Every day | ORAL | Status: DC
  Filled 2015-03-08: qty 1

## 2015-03-08 MED ORDER — DIPHENHYDRAMINE HCL 25 MG PO CAPS: 50.0000 mg | ORAL_CAPSULE | Freq: Every evening | ORAL | Status: DC | PRN

## 2015-03-08 MED ORDER — ALTEPLASE 2 MG IJ SOLR: 2.0000 mg | Freq: Once | INTRAMUSCULAR | Status: AC | PRN

## 2015-03-08 MED ORDER — HYDRALAZINE HCL 25 MG PO TABS
25.0000 mg | ORAL_TABLET | Freq: Three times a day (TID) | ORAL | Status: DC
  Administered 2015-03-09: 25 mg via ORAL
  Filled 2015-03-08 (×5): qty 1

## 2015-03-08 MED ORDER — COLCHICINE 0.6 MG PO TABS
0.6000 mg | ORAL_TABLET | Freq: Every day | ORAL | Status: DC
  Filled 2015-03-08: qty 1

## 2015-03-08 MED ORDER — SODIUM CHLORIDE 0.9 % IJ SOLN
3.0000 mL | Freq: Two times a day (BID) | INTRAMUSCULAR | Status: DC
  Administered 2015-03-09: 3 mL via INTRAVENOUS

## 2015-03-08 MED ORDER — TENOFOVIR DISOPROXIL FUMARATE 300 MG PO TABS
300.0000 mg | ORAL_TABLET | ORAL | Status: DC
  Filled 2015-03-08: qty 1

## 2015-03-08 MED ORDER — LORAZEPAM 0.5 MG PO TABS: 0.5000 mg | ORAL_TABLET | Freq: Two times a day (BID) | ORAL | Status: DC | PRN

## 2015-03-08 MED ORDER — SODIUM CHLORIDE 0.9 % IJ SOLN: 3.0000 mL | Freq: Two times a day (BID) | INTRAMUSCULAR | Status: DC

## 2015-03-08 MED ORDER — SEVELAMER CARBONATE 800 MG PO TABS: 2400.0000 mg | ORAL_TABLET | Freq: Three times a day (TID) | ORAL | Status: DC

## 2015-03-08 MED ORDER — HEPARIN SODIUM (PORCINE) 1000 UNIT/ML DIALYSIS
20.0000 [IU]/kg | INTRAMUSCULAR | Status: DC | PRN
  Filled 2015-03-08: qty 3

## 2015-03-08 NOTE — H&P (Signed)
[facility] History and Physical  [name] ZOX:[id number] DOB: 08-23-1951 DOA: 03/08/2015  Referring physician: Lorre Nick, MD PCP: No PCP Per Patient   Chief Complaint: Missed Dialysis  HPI: [name] is a 64 y.o. female with multiple medical problems including HIV CKD on HD HTN non-compliance heart failure presents with missing a dialysis session today. The patient states that she did not go to the dialysis center because they did not have time for her. Patient states that she was dialyzed on Friday and was due for one today. Patient states that she has no chest pain no SOB noted. She has no cough noted. She has chronic edema. She states that she has been taking her medications as prescribed. She was noted to have hyperkalemia and was given kayexalate. The ED spoke to nephrology and they will plan on dialysis on the patient in the  Morning.   Review of Systems:  Complete ROS performed and is unremarkable other than HPI  Past Medical History  Diagnosis Date  . Hypertension   . HIV infection   . Morbid obesity   . ESRD (end stage renal disease) on dialysis 09/30/2013    Started dialysis in [place], [place] around 2009.  ESRD due to HTN vs drug abuse according to pt.  Was on dialysis at [facility] until Feb 2015 when she was admitted to a SNF due to homelessness and drug abuse.  Then changed to [facility] on TTS schedule.     . Anginal pain   . CHF (congestive heart failure)   . Shortness of breath   . Pneumonia   . Depression   . Anxiety   . GERD (gastroesophageal reflux disease)   . Arthritis   . Anemia   . Hyperkalemia 08/11/2014   Past Surgical History  Procedure Laterality Date  . Arteriovenous graft placement      left forearm  . Cardiac catheterization    . Laparotomy      states seh was cut open because seh was bleeding on the inside   Social History:  reports that she has been smoking Cigarettes.  She started smoking about 10 months ago.  She has been smoking about 0.50 packs per day. She has never used smokeless tobacco. She reports that she does not drink alcohol or use illicit drugs.  Allergies  Allergen Reactions  . Trazodone And Nefazodone Other (See Comments)    "Gives me bad dreams"  . Morphine And Related Hives and Rash    Family History  Problem Relation Age of Onset  . Kidney failure Other     niece  . High blood pressure    . Lung cancer    . Breast cancer Neg Hx   . Colon cancer Neg Hx   . Stroke Mother   . HIV/AIDS Brother     died of AIDS     Prior to Admission medications   Medication Sig Start Date End Date Taking? Authorizing Provider  albuterol (PROVENTIL HFA;VENTOLIN HFA) 108 (90 BASE) MCG/ACT inhaler Inhale 2 puffs into the lungs every 6 (six) hours as needed for wheezing or shortness of breath.    Historical Provider, MD  amLODipine (NORVASC) 5 MG tablet Take 10 mg by mouth at bedtime.     Historical Provider, MD  calcitRIOL (ROCALTROL) 0.5 MCG capsule Take 2 mcg by mouth daily. Takes on Mon, Wed, and Fri with Dialysis    Historical Provider, MD  cinacalcet (SENSIPAR) 90 MG tablet Take 90  mg by mouth daily.    Historical Provider, MD  colchicine 0.6 MG tablet Take 0.6 mg by mouth daily.    Historical Provider, MD  diphenhydrAMINE (BENADRYL) 25 mg capsule Take 50 mg by mouth at bedtime as needed for sleep.    Historical Provider, MD  FLUoxetine (PROZAC) 20 MG capsule Take 20 mg by mouth daily.    Historical Provider, MD  folic acid-vitamin b complex-vitamin c-selenium-zinc (DIALYVITE) 3 MG TABS tablet Take 1 tablet by mouth daily.    Historical Provider, MD  hydrALAZINE (APRESOLINE) 25 MG tablet Take 25 mg by mouth every 8 (eight) hours.    Historical Provider, MD  isosorbide mononitrate (IMDUR) 30 MG 24 hr tablet Take 30 mg by mouth daily.    Historical Provider, MD  lamivudine (EPIVIR-HBV) 5 MG/ML solution Take 5 mLs (25 mg total) by mouth daily. 07/14/14   Gardiner Barefootobert W Comer, MD  LORazepam (ATIVAN)  0.5 MG tablet Take 0.5 mg by mouth every 12 (twelve) hours as needed for anxiety.    Historical Provider, MD  omeprazole (PRILOSEC) 20 MG capsule Take 20 mg by mouth daily.    Historical Provider, MD  oxyCODONE-acetaminophen (PERCOCET/ROXICET) 5-325 MG per tablet Take 1-2 tablets by mouth every 6 (six) hours as needed for severe pain. 02/24/15   Gwyneth SproutWhitney Plunkett, MD  polyethylene glycol (MIRALAX / GLYCOLAX) packet Take 17 g by mouth daily as needed for mild constipation.    Historical Provider, MD  raltegravir (ISENTRESS) 400 MG tablet Take 1 tablet (400 mg total) by mouth 2 (two) times daily. 07/14/14   Gardiner Barefootobert W Comer, MD  sevelamer carbonate (RENVELA) 800 MG tablet Take 2,400 mg by mouth 3 (three) times daily with meals.    Historical Provider, MD  sevelamer carbonate (RENVELA) 800 MG tablet Take 3 tablets (2,400 mg total) by mouth 3 (three) times daily with meals. 08/14/14   Erasmo DownerAngela M Bacigalupo, MD  tenofovir (VIREAD) 300 MG tablet Take 1 tablet (300 mg total) by mouth once a week. Give on Friday 07/14/14   Gardiner Barefootobert W Comer, MD  tiotropium (SPIRIVA) 18 MCG inhalation capsule Place 18 mcg into inhaler and inhale daily.    Historical Provider, MD   Physical Exam: Filed Vitals:   03/08/15 1852 03/08/15 2117 03/08/15 2130  BP: 169/87 178/89 178/93  Pulse: 102 90 90  Temp: 98.5 F (36.9 C)    TempSrc: Oral    Resp: 20 16 14   SpO2: 97% 97% 96%    Wt Readings from Last 3 Encounters:  03/05/15 115.214 kg (254 lb)  03/01/15 115.214 kg (254 lb)  02/24/15 115.214 kg (254 lb)    General:  Appears calm and comfortable sleeping Eyes: PERRL, normal lids, irises & conjunctiva ENT: grossly normal hearing, lips & tongue Neck: no LAD, masses or thyromegaly Cardiovascular: RRR, no m/r/g Respiratory: CTA bilaterally, no w/r/r Abdomen: soft, ntnd obese Skin: no rash or induration seen Musculoskeletal: grossly normal tone Psychiatric: grossly normal mood and affect Neurologic: grossly non-focal.           Labs on Admission:  Basic Metabolic Panel:  Recent Labs Lab 03/03/15 1350 03/05/15 1031 03/08/15 2052  NA 136 133* 135  K 5.2* 5.5* 5.9*  CL 97* 98* 98*  CO2 25  --  22  GLUCOSE 119* 119* 101*  BUN 51* 44* 60*  CREATININE 9.02* 9.00* 11.12*  CALCIUM 6.9*  --  7.9*  PHOS 5.4*  --   --    Liver Function Tests:  Recent Labs Lab  03/03/15 1350  ALBUMIN 3.0*   No results for input(s): LIPASE, AMYLASE in the last 168 hours. No results for input(s): AMMONIA in the last 168 hours. CBC:  Recent Labs Lab 03/03/15 1350 03/05/15 1031 03/08/15 2052  WBC 8.0  --  8.2  NEUTROABS  --   --  4.6  HGB 9.8* 11.6* 9.7*  HCT 30.6* 34.0* 30.5*  MCV 108.5*  --  110.5*  PLT 242  --  252   Cardiac Enzymes: No results for input(s): CKTOTAL, CKMB, CKMBINDEX, TROPONINI in the last 168 hours.  BNP (last 3 results) No results for input(s): BNP in the last 8760 hours.  ProBNP (last 3 results)  Recent Labs  06/04/14 2008 07/12/14 1249 08/10/14 1422  PROBNP 4928.0* 9434.0* 16099.0*    CBG: No results for input(s): GLUCAP in the last 168 hours.  Radiological Exams on Admission: Dg Chest 2 View  03/08/2015   CLINICAL DATA:  Shortness of breath, leg swelling  EXAM: CHEST  2 VIEW  COMPARISON:  03/05/2015  FINDINGS: Lungs are clear. No frank interstitial edema. No pleural effusion or pneumothorax.  Mild cardiomegaly.  Visualized osseous structures are within normal limits.  IMPRESSION: No evidence of acute cardiopulmonary disease.   Electronically Signed   By: Charline Bills M.D.   On: 03/08/2015 21:12      Assessment/Plan Active Problems:   Hypertension   ESRD (end stage renal disease) on dialysis   Hyperkalemia   HIV disease   Chronic combined systolic and diastolic congestive heart failure   COPD (chronic obstructive pulmonary disease)   CKD (chronic kidney disease) requiring chronic dialysis   1. CKD on renal dialysis -she was supposed to have dialysis today went to  the center but did not want to wait so she left -ED spoke to nephrology and she will be set up for dialysis in am  2. Hyperkalemia -she has received kayexalate -will place on telemetry -repeat labs in am after dialysis -she has a history of not completing HD sessions so her K remains chronically high  3. HIV disease -will continue with HAART  4. Chronic Systolic and Diastolic Heart failure -currently compensated -continue with home medications  5. COPD -will continue with inhalers currently compensated  6. HTN -she will be continued with her antihypertensives     Code Status: Full Code (must indicate code status--if unknown or must be presumed, indicate so) DVT Prophylaxis:Heparin Family Communication: none (indicate person spoken with, if applicable, with phone number if by telephone) Disposition Plan: Home (indicate anticipated LOS)  Time spent: Obs   [facility] Pager [phone]

## 2015-03-08 NOTE — Progress Notes (Signed)
Attempted report. ED RN states she will call back.

## 2015-03-08 NOTE — ED Notes (Signed)
Pt in via EMS to triage stating she needs dialysis, pt didn't wait at her appointment today due to a long wait for her treatment, went home and felt like she was starting to swell, and c/o shortness of breath with exertion, no distress noted, pt upset that she isn't going straight to dialysis

## 2015-03-08 NOTE — ED Provider Notes (Signed)
CSN: [id number]     Arrival date & time 03/08/15  1844 History   First MD Initiated Contact with Patient 03/08/15 2021     Chief Complaint  Patient presents with  . Vascular Access Problem     (Consider location/radiation/quality/duration/timing/severity/associated sxs/prior Treatment) HPI Comments: Patient here complaining of shortness of breath. Was supposed go to dialysis today but missed her appointment. Patient was last dialyzed 2 days ago. She receives her dialysis by coming to the emergency room according to her. She denies any chest pain. No palpitations. Has chronic lower extremity edema. No treatment used prior to arrival  The history is provided by the patient.    Past Medical History  Diagnosis Date  . Hypertension   . HIV infection   . Morbid obesity   . ESRD (end stage renal disease) on dialysis 09/30/2013    Started dialysis in [place], [place] around 2009.  ESRD due to HTN vs drug abuse according to pt.  Was on dialysis at [facility] until Feb 2015 when she was admitted to a SNF due to homelessness and drug abuse.  Then changed to [facility] on TTS schedule.     . Anginal pain   . CHF (congestive heart failure)   . Shortness of breath   . Pneumonia   . Depression   . Anxiety   . GERD (gastroesophageal reflux disease)   . Arthritis   . Anemia   . Hyperkalemia 08/11/2014   Past Surgical History  Procedure Laterality Date  . Arteriovenous graft placement      left forearm  . Cardiac catheterization    . Laparotomy      states seh was cut open because seh was bleeding on the inside   Family History  Problem Relation Age of Onset  . Kidney failure Other     niece  . High blood pressure    . Lung cancer    . Breast cancer Neg Hx   . Colon cancer Neg Hx   . Stroke Mother   . HIV/AIDS Brother     died of AIDS   History  Substance Use Topics  . Smoking status: Current Every Day Smoker -- 0.50 packs/day    Types: Cigarettes    Start date: 05/06/2014  .  Smokeless tobacco: Never Used     Comment: recently quit  . Alcohol Use: No   OB History    No data available     Review of Systems  All other systems reviewed and are negative.     Allergies  Trazodone and nefazodone and Morphine and related  Home Medications   Prior to Admission medications   Medication Sig Start Date End Date Taking? Authorizing Provider  albuterol (PROVENTIL HFA;VENTOLIN HFA) 108 (90 BASE) MCG/ACT inhaler Inhale 2 puffs into the lungs every 6 (six) hours as needed for wheezing or shortness of breath.    Historical Provider, MD  amLODipine (NORVASC) 5 MG tablet Take 10 mg by mouth at bedtime.     Historical Provider, MD  calcitRIOL (ROCALTROL) 0.5 MCG capsule Take 2 mcg by mouth daily. Takes on Mon, Wed, and Fri with Dialysis    Historical Provider, MD  cinacalcet (SENSIPAR) 90 MG tablet Take 90 mg by mouth daily.    Historical Provider, MD  colchicine 0.6 MG tablet Take 0.6 mg by mouth daily.    Historical Provider, MD  diphenhydrAMINE (BENADRYL) 25 mg capsule Take 50 mg by mouth at bedtime as needed for sleep.  Historical Provider, MD  FLUoxetine (PROZAC) 20 MG capsule Take 20 mg by mouth daily.    Historical Provider, MD  folic acid-vitamin b complex-vitamin c-selenium-zinc (DIALYVITE) 3 MG TABS tablet Take 1 tablet by mouth daily.    Historical Provider, MD  hydrALAZINE (APRESOLINE) 25 MG tablet Take 25 mg by mouth every 8 (eight) hours.    Historical Provider, MD  isosorbide mononitrate (IMDUR) 30 MG 24 hr tablet Take 30 mg by mouth daily.    Historical Provider, MD  lamivudine (EPIVIR-HBV) 5 MG/ML solution Take 5 mLs (25 mg total) by mouth daily. 07/14/14   Gardiner Barefootobert W Comer, MD  LORazepam (ATIVAN) 0.5 MG tablet Take 0.5 mg by mouth every 12 (twelve) hours as needed for anxiety.    Historical Provider, MD  omeprazole (PRILOSEC) 20 MG capsule Take 20 mg by mouth daily.    Historical Provider, MD  oxyCODONE-acetaminophen (PERCOCET/ROXICET) 5-325 MG per tablet  Take 1-2 tablets by mouth every 6 (six) hours as needed for severe pain. 02/24/15   Gwyneth SproutWhitney Plunkett, MD  polyethylene glycol (MIRALAX / GLYCOLAX) packet Take 17 g by mouth daily as needed for mild constipation.    Historical Provider, MD  raltegravir (ISENTRESS) 400 MG tablet Take 1 tablet (400 mg total) by mouth 2 (two) times daily. 07/14/14   Gardiner Barefootobert W Comer, MD  sevelamer carbonate (RENVELA) 800 MG tablet Take 2,400 mg by mouth 3 (three) times daily with meals.    Historical Provider, MD  sevelamer carbonate (RENVELA) 800 MG tablet Take 3 tablets (2,400 mg total) by mouth 3 (three) times daily with meals. 08/14/14   Erasmo DownerAngela M Bacigalupo, MD  tenofovir (VIREAD) 300 MG tablet Take 1 tablet (300 mg total) by mouth once a week. Give on Friday 07/14/14   Gardiner Barefootobert W Comer, MD  tiotropium (SPIRIVA) 18 MCG inhalation capsule Place 18 mcg into inhaler and inhale daily.    Historical Provider, MD   BP 169/87 mmHg  Pulse 102  Temp(Src) 98.5 F (36.9 C) (Oral)  Resp 20  SpO2 97% Physical Exam  Constitutional: She is oriented to person, place, and time. She appears well-developed and well-nourished.  Non-toxic appearance. No distress.  HENT:  Head: Normocephalic and atraumatic.  Eyes: Conjunctivae, EOM and lids are normal. Pupils are equal, round, and reactive to light.  Neck: Normal range of motion. Neck supple. No tracheal deviation present. No thyroid mass present.  Cardiovascular: Normal rate, regular rhythm and normal heart sounds.  Exam reveals no gallop.   No murmur heard. Pulmonary/Chest: Effort normal and breath sounds normal. No stridor. No respiratory distress. She has no decreased breath sounds. She has no wheezes. She has no rhonchi. She has no rales.  Abdominal: Soft. Normal appearance and bowel sounds are normal. She exhibits no distension. There is no tenderness. There is no rebound and no CVA tenderness.  Musculoskeletal: Normal range of motion. She exhibits no edema or tenderness.        Hands: Neurological: She is alert and oriented to person, place, and time. She has normal strength. No cranial nerve deficit or sensory deficit. GCS eye subscore is 4. GCS verbal subscore is 5. GCS motor subscore is 6.  Skin: Skin is warm and dry. No abrasion and no rash noted.  Psychiatric: She has a normal mood and affect. Her speech is normal and behavior is normal.  Nursing note and vitals reviewed.   ED Course  Procedures (including critical care time) Labs Review Labs Reviewed  BASIC METABOLIC PANEL  CBC WITH  DIFFERENTIAL/PLATELET    Imaging Review No results found.   EKG Interpretation   Date/Time:  Monday Mar 08 2015 20:39:37 EDT Ventricular Rate:  90 PR Interval:  188 QRS Duration: 108 QT Interval:  384 QTC Calculation: 470 R Axis:   44 Text Interpretation:  Sinus rhythm No significant change since last  tracing Confirmed by Carnell Beavers  MD, Peniel Hass ([id number]) on 03/08/2015 8:46:00 PM      MDM   Final diagnoses:  SOB (shortness of breath)     Patient given Kayexalate for her hyperkalemia. Spoke with the nephrologist on call and recommends that either patient be sent home and return tomorrow via EMS or be admitted for dialysis in the morning. Spoke with patient and she preferred to stay in the hospital   Lorre Nick, MD 03/08/15 2154

## 2015-03-08 NOTE — ED Notes (Signed)
Hospitalist at the bedside 

## 2015-03-09 DIAGNOSIS — I5042 Chronic combined systolic (congestive) and diastolic (congestive) heart failure: Secondary | ICD-10-CM | POA: Diagnosis not present

## 2015-03-09 DIAGNOSIS — N186 End stage renal disease: Secondary | ICD-10-CM

## 2015-03-09 DIAGNOSIS — B20 Human immunodeficiency virus [HIV] disease: Secondary | ICD-10-CM | POA: Diagnosis not present

## 2015-03-09 DIAGNOSIS — Z992 Dependence on renal dialysis: Secondary | ICD-10-CM

## 2015-03-09 DIAGNOSIS — R0602 Shortness of breath: Secondary | ICD-10-CM | POA: Diagnosis not present

## 2015-03-09 LAB — CBC
HCT: 29.3 % — ABNORMAL LOW (ref 36.0–46.0)
Hemoglobin: 9.3 g/dL — ABNORMAL LOW (ref 12.0–15.0)
MCH: 35 pg — ABNORMAL HIGH (ref 26.0–34.0)
MCHC: 31.7 g/dL (ref 30.0–36.0)
MCV: 110.2 fL — ABNORMAL HIGH (ref 78.0–100.0)
Platelets: 240 10*3/uL (ref 150–400)
RBC: 2.66 MIL/uL — ABNORMAL LOW (ref 3.87–5.11)
RDW: 13.5 % (ref 11.5–15.5)
WBC: 8.8 10*3/uL (ref 4.0–10.5)

## 2015-03-09 LAB — COMPREHENSIVE METABOLIC PANEL
ALT: 10 U/L — AB (ref 14–54)
AST: 14 U/L — AB (ref 15–41)
Albumin: 3.2 g/dL — ABNORMAL LOW (ref 3.5–5.0)
Alkaline Phosphatase: 431 U/L — ABNORMAL HIGH (ref 38–126)
Anion gap: 15 (ref 5–15)
BUN: 65 mg/dL — ABNORMAL HIGH (ref 6–20)
CO2: 24 mmol/L (ref 22–32)
Calcium: 7.9 mg/dL — ABNORMAL LOW (ref 8.9–10.3)
Chloride: 97 mmol/L — ABNORMAL LOW (ref 101–111)
Creatinine, Ser: 11.73 mg/dL — ABNORMAL HIGH (ref 0.44–1.00)
GFR calc Af Amer: 3 mL/min — ABNORMAL LOW (ref >60)
GFR calc non Af Amer: 3 mL/min — ABNORMAL LOW (ref >60)
Glucose, Bld: 79 mg/dL (ref 65–99)
Potassium: 5.4 mmol/L — ABNORMAL HIGH (ref 3.5–5.1)
SODIUM: 136 mmol/L (ref 135–145)
Total Bilirubin: 0.6 mg/dL (ref 0.3–1.2)
Total Protein: 6.6 g/dL (ref 6.5–8.1)

## 2015-03-09 LAB — PHOSPHORUS: PHOSPHORUS: 7 mg/dL — AB (ref 2.5–4.6)

## 2015-03-09 LAB — MRSA PCR SCREENING: MRSA by PCR: NEGATIVE

## 2015-03-09 MED ORDER — COLCHICINE 0.6 MG PO TABS: 0.3000 mg | ORAL_TABLET | ORAL | Status: DC

## 2015-03-09 MED ORDER — HEPARIN SODIUM (PORCINE) 1000 UNIT/ML DIALYSIS: 1000.0000 [IU] | INTRAMUSCULAR | Status: DC | PRN

## 2015-03-09 MED ORDER — SODIUM CHLORIDE 0.9 % IV SOLN: 100.0000 mL | INTRAVENOUS | Status: DC | PRN

## 2015-03-09 MED ORDER — LIDOCAINE-PRILOCAINE 2.5-2.5 % EX CREA: 1.0000 "application " | TOPICAL_CREAM | CUTANEOUS | Status: DC | PRN

## 2015-03-09 MED ORDER — HEPARIN SODIUM (PORCINE) 1000 UNIT/ML DIALYSIS: 20.0000 [IU]/kg | INTRAMUSCULAR | Status: DC | PRN

## 2015-03-09 MED ORDER — CALCITRIOL 0.5 MCG PO CAPS: 2.0000 ug | ORAL_CAPSULE | ORAL | Status: DC

## 2015-03-09 MED ORDER — HEPARIN SODIUM (PORCINE) 1000 UNIT/ML DIALYSIS
100.0000 [IU]/kg | INTRAMUSCULAR | Status: DC | PRN
  Filled 2015-03-09: qty 12

## 2015-03-09 MED ORDER — LIDOCAINE HCL (PF) 1 % IJ SOLN: 5.0000 mL | INTRAMUSCULAR | Status: DC | PRN

## 2015-03-09 MED ORDER — ZOLPIDEM TARTRATE 5 MG PO TABS: 5.0000 mg | ORAL_TABLET | Freq: Every evening | ORAL | Status: DC | PRN

## 2015-03-09 MED ORDER — NEPRO/CARBSTEADY PO LIQD: 237.0000 mL | ORAL | Status: DC | PRN

## 2015-03-09 MED ORDER — ALTEPLASE 2 MG IJ SOLR: 2.0000 mg | Freq: Once | INTRAMUSCULAR | Status: DC | PRN

## 2015-03-09 NOTE — Discharge Summary (Signed)
Physician Discharge Summary  [name] UJW:[id number] DOB: 06/24/1951 DOA: 03/08/2015  PCP: No PCP Per Patient  Admit date: 03/08/2015 Discharge date: 03/09/2015  PATIENT LEAVING AGAINST MEDICAL ADVICE  Discharge Diagnoses:   ESRD with hyperkalemia -Patient was dialyzed on 03/09/2015 -Due to patient's behavior problems, she does not have an established outpatient dialysis center -She usually comes to the emergency department every so often to get dialyzed -Continue Sensipar and Renvela  Hyperkalemia -Patient was dialyzed -Patient is noncompliant with dialysis and frequently leaves before completion. -Patient was placed on telemetry during this admission without any concerning dysrhythmia  Chronic systolic and diastolic CHF -04/16/2014 echocardiogram shows EF 35-40 percent, grade 1 diastolic dysfunction -Continue hydralazine, Imdur  Hypertension -Continue amlodipine, hydralazine, Imdur  HIV -Continue anti-retroviral therapy with Isentress, tenofovir, lamivudine -Last HIV RNA 08/12/2014 undetectable  COPD -Appears compensated -Continue Spiriva  Depression -Continue Prozac  Discharge Condition: leaving AMA  Disposition: leaving AMA  Diet:renal Wt Readings from Last 3 Encounters:  03/09/15 117.7 kg (259 lb 7.7 oz)  03/05/15 115.214 kg (254 lb)  03/01/15 115.214 kg (254 lb)    History of present illness:  64 year old female with a history of HIV, ESRD, systolic and diastolic CHF, and noncompliance presented to the emergency department for dialysis as she has missed her last scheduled dialysis on 03/08/2015. The patient normally does not have an established outpatient dialysis center due to her belligerent behavior. She normally comes to the emergency department doctor dialyzed every so often. The patient was noted to be hyperkalemia and given Kayexalate. Nephrology was consulted and the patient was dialyzed on 03/09/2015. Prior to getting her scheduled  medications, the patient pulled out her IV by herself and the management to leave the hospital. The patient did not want to wait for any refills of her medications. In addition, she did not want to wait to receive her scheduled medications. The patient left AGAINST MEDICAL ADVICE. The patient has a poor social situation and lives out of the motel. She frequently has a history of suicidal ideation, but presently denies any.  Consultants: nephrology  Discharge Exam: Filed Vitals:   03/09/15 0942  BP: 159/94  Pulse: 110  Temp:   Resp:    Filed Vitals:   03/09/15 0830 03/09/15 0900 03/09/15 0930 03/09/15 0942  BP: 149/83 154/89 175/107 159/94  Pulse: 105 108 113 110  Temp:      TempSrc:      Resp:      Height:      Weight:    117.7 kg (259 lb 7.7 oz)  SpO2:       General: A&O x 3, NAD, pleasant, cooperative Cardiovascular: RRR, no rub, no gallop, no S3 Respiratory: CTAB, no wheeze, no rhonchi Abdomen:soft, nontender, nondistended, positive bowel sounds Extremities: No edema, No lymphangitis, no petechiae  Discharge Instructions     Medication List    ASK your doctor about these medications        albuterol 108 (90 BASE) MCG/ACT inhaler  Commonly known as:  PROVENTIL HFA;VENTOLIN HFA  Inhale 2 puffs into the lungs every 6 (six) hours as needed for wheezing or shortness of breath.     amLODipine 5 MG tablet  Commonly known as:  NORVASC  Take 10 mg by mouth at bedtime.     calcitRIOL 0.5 MCG capsule  Commonly known as:  ROCALTROL  Take 2 mcg by mouth daily. Takes on Mon, Wed, and Fri with Dialysis     cinacalcet 90 MG tablet  Commonly  known as:  SENSIPAR  Take 90 mg by mouth daily.     colchicine 0.6 MG tablet  Take 0.6 mg by mouth daily.     diphenhydrAMINE 25 mg capsule  Commonly known as:  BENADRYL  Take 50 mg by mouth at bedtime as needed for sleep.     FLUoxetine 20 MG capsule  Commonly known as:  PROZAC  Take 20 mg by mouth daily.     folic acid-vitamin  b complex-vitamin c-selenium-zinc 3 MG Tabs tablet  Take 1 tablet by mouth daily.     hydrALAZINE 25 MG tablet  Commonly known as:  APRESOLINE  Take 25 mg by mouth every 8 (eight) hours.     isosorbide mononitrate 30 MG 24 hr tablet  Commonly known as:  IMDUR  Take 30 mg by mouth daily.     lamivudine 5 MG/ML solution  Commonly known as:  EPIVIR-HBV  Take 5 mLs (25 mg total) by mouth daily.     LORazepam 0.5 MG tablet  Commonly known as:  ATIVAN  Take 0.5 mg by mouth every 12 (twelve) hours as needed for anxiety.     omeprazole 20 MG capsule  Commonly known as:  PRILOSEC  Take 20 mg by mouth daily.     oxyCODONE-acetaminophen 5-325 MG per tablet  Commonly known as:  PERCOCET/ROXICET  Take 1-2 tablets by mouth every 6 (six) hours as needed for severe pain.     polyethylene glycol packet  Commonly known as:  MIRALAX / GLYCOLAX  Take 17 g by mouth daily as needed for mild constipation.     raltegravir 400 MG tablet  Commonly known as:  ISENTRESS  Take 1 tablet (400 mg total) by mouth 2 (two) times daily.     sevelamer carbonate 800 MG tablet  Commonly known as:  RENVELA  Take 2,400 mg by mouth 3 (three) times daily with meals.     sevelamer carbonate 800 MG tablet  Commonly known as:  RENVELA  Take 3 tablets (2,400 mg total) by mouth 3 (three) times daily with meals.     tenofovir 300 MG tablet  Commonly known as:  VIREAD  Take 1 tablet (300 mg total) by mouth once a week. Give on Friday     tiotropium 18 MCG inhalation capsule  Commonly known as:  SPIRIVA  Place 18 mcg into inhaler and inhale daily.         The results of significant diagnostics from this hospitalization (including imaging, microbiology, ancillary and laboratory) are listed below for reference.    Significant Diagnostic Studies: Dg Chest 2 View  03/08/2015   CLINICAL DATA:  Shortness of breath, leg swelling  EXAM: CHEST  2 VIEW  COMPARISON:  03/05/2015  FINDINGS: Lungs are clear. No frank  interstitial edema. No pleural effusion or pneumothorax.  Mild cardiomegaly.  Visualized osseous structures are within normal limits.  IMPRESSION: No evidence of acute cardiopulmonary disease.   Electronically Signed   By: Charline BillsSriyesh  Krishnan M.D.   On: 03/08/2015 21:12   Dg Chest 2 View  03/05/2015   CLINICAL DATA:  Shortness of Breath.  Renal failure.  EXAM: CHEST  2 VIEW  COMPARISON:  None.  FINDINGS: The lungs are clear. Heart is upper normal in size with pulmonary vascularity within normal limits. No adenopathy. There is atherosclerotic change in the aortic arch region. There is endplate sclerosis involving multiple vertebral bodies consistent with renal failure.  IMPRESSION: No edema or consolidation.   Electronically Signed  By: Bretta Bang III M.D.   On: 03/05/2015 08:41   Dg Finger Ring Right  03/05/2015   CLINICAL DATA:  Right ring finger injury.  Injured on 02/17/2015.  EXAM: RIGHT RING FINGER 2+V  COMPARISON:  02/18/2015.  FINDINGS: Diffuse soft-tissue swelling . Comminuted displaced fracture of the distal aspect of the middle phalanx of the right fourth digit is present. Displaced fracture fragments are present. Lucency noted about the fracture. This may be related to bony resorption. Possibility of osteomyelitis cannot be completely excluded.  IMPRESSION: Comminuted displaced fracture of the distal aspect of the middle phalanx of the right fourth digit. Lucency noted about the fracture site may be related to bony resorption. Osteomyelitis cannot be excluded .   Electronically Signed   By: Maisie Fus  Register   On: 03/05/2015 08:43   Dg Finger Ring Right  02/18/2015   CLINICAL DATA:  Crush injury with wheelchair of the fourth digit  EXAM: RIGHT RING FINGER 2+V  COMPARISON:  None.  FINDINGS: There is a nondisplaced fracture through the distal aspect of the fourth middle phalanx. Soft tissue swelling is noted.  IMPRESSION: Fracture through the distal aspect of the fourth middle phalanx.    Electronically Signed   By: Alcide Clever M.D.   On: 02/18/2015 16:54     Microbiology: Recent Results (from the past 240 hour(s))  MRSA PCR Screening     Status: None   Collection Time: 03/09/15  2:16 AM  Result Value Ref Range Status   MRSA by PCR NEGATIVE NEGATIVE Final    Comment:        The GeneXpert MRSA Assay (FDA approved for NASAL specimens only), is one component of a comprehensive MRSA colonization surveillance program. It is not intended to diagnose MRSA infection nor to guide or monitor treatment for MRSA infections.      Labs: Basic Metabolic Panel:  Recent Labs Lab 03/03/15 1350 03/05/15 1031 03/08/15 2052 03/09/15 0626  NA 136 133* 135 136  K 5.2* 5.5* 5.9* 5.4*  CL 97* 98* 98* 97*  CO2 25  --  22 24  GLUCOSE 119* 119* 101* 79  BUN 51* 44* 60* 65*  CREATININE 9.02* 9.00* 11.12* 11.73*  CALCIUM 6.9*  --  7.9* 7.9*  PHOS 5.4*  --   --  7.0*   Liver Function Tests:  Recent Labs Lab 03/03/15 1350 03/09/15 0626  AST  --  14*  ALT  --  10*  ALKPHOS  --  431*  BILITOT  --  0.6  PROT  --  6.6  ALBUMIN 3.0* 3.2*   No results for input(s): LIPASE, AMYLASE in the last 168 hours. No results for input(s): AMMONIA in the last 168 hours. CBC:  Recent Labs Lab 03/03/15 1350 03/05/15 1031 03/08/15 2052 03/09/15 0626  WBC 8.0  --  8.2 8.8  NEUTROABS  --   --  4.6  --   HGB 9.8* 11.6* 9.7* 9.3*  HCT 30.6* 34.0* 30.5* 29.3*  MCV 108.5*  --  110.5* 110.2*  PLT 242  --  252 240   Cardiac Enzymes: No results for input(s): CKTOTAL, CKMB, CKMBINDEX, TROPONINI in the last 168 hours. BNP: Invalid input(s): POCBNP CBG: No results for input(s): GLUCAP in the last 168 hours.  Time coordinating discharge:  Greater than 30 minutes  Signed:  Jaileen Janelle, DO [facility] Pager: [phone] 03/09/2015, 7:10 PM

## 2015-03-09 NOTE — Progress Notes (Addendum)
Pt very agitated upon return from HD. Wants to sign out AMA although refused to sign AMA form. Also refused vitals and assessment and requested RN to remove IV. Pt removed IV on her own and bled out. Pressure held and bleeding stopped. Pt loudly requesting taxi voucher. Informed SW. Pt provided with voucher and escorted out via wheelchair by NT.   Carrie MewJasmine Caster [place], RN

## 2015-03-09 NOTE — Progress Notes (Addendum)
03/09/2015 12:00 AM  Patient arrived to the unit via stretcher. Patient alert and oriented to the unit, staff, and room. Patient placed on tele. CCMD notified. Patient shows no signs of distress, denies having any pain. Patient's skin assessed, dry and intact. Skin assessment completed with Tempie DonningMercy Birago, RN. Fall prevention safety plan discussed with patient and signature obtained. No family at patient's bedside. RN will continue to monitor patient.  Veatrice KellsMahmoud,Abas Leicht I, FirefighterN  [address] Phone [phone]

## 2015-03-09 NOTE — Consult Note (Signed)
Renal Service Consult Note [facility]  [name] 03/09/2015 [name] D Requesting Physician:  Dr Tat  Reason for Consult:  Hyperkalemia HPI: The patient is a 64 y.o. year-old w hist of HIV, end stage renal disease, hypertension.  She has been without an outpatient dialysis unit for several months now and comes to the hospital on average 3 times per week to get dialysis. She has been refused by [facility] and by a number of other dialysis centers in the area due to behavior and compliance issues.  She was admitted overnight with high potassium.  On HD now, no complaints. States she is living with her mother in [place] and calls the ambulance to bring her to [facility] ED three times a week to get dialysis here in the hospital.     ROS  no sob, cp, no n/v/d, no abd pain or confusion  Past Medical History  Past Medical History  Diagnosis Date  . Hypertension   . HIV infection   . Morbid obesity   . ESRD (end stage renal disease) on dialysis 09/30/2013    Started dialysis in [place], [place] around 2009.  ESRD due to HTN vs drug abuse according to pt.  Was on dialysis at [facility] until Feb 2015 when she was admitted to a SNF due to homelessness and drug abuse.  Then changed to [facility] on TTS schedule.     . Anginal pain   . CHF (congestive heart failure)   . Shortness of breath   . Pneumonia   . Depression   . Anxiety   . GERD (gastroesophageal reflux disease)   . Arthritis   . Anemia   . Hyperkalemia 08/11/2014   Past Surgical History  Past Surgical History  Procedure Laterality Date  . Arteriovenous graft placement      left forearm  . Cardiac catheterization    . Laparotomy      states seh was cut open because seh was bleeding on the inside   Family History  Family History  Problem Relation Age of Onset  . Kidney failure Other     niece  . High blood pressure    . Lung cancer    . Breast cancer Neg Hx   . Colon cancer Neg Hx   . Stroke Mother    . HIV/AIDS Brother     died of AIDS   Social History  reports that she has been smoking Cigarettes.  She started smoking about 10 months ago. She has been smoking about 0.50 packs per day. She has never used smokeless tobacco. She reports that she does not drink alcohol or use illicit drugs. Allergies  Allergies  Allergen Reactions  . Trazodone And Nefazodone Other (See Comments)    "Gives me bad dreams"  . Morphine And Related Hives and Rash   Home medications Prior to Admission medications   Medication Sig Start Date End Date Taking? Authorizing Provider  albuterol (PROVENTIL HFA;VENTOLIN HFA) 108 (90 BASE) MCG/ACT inhaler Inhale 2 puffs into the lungs every 6 (six) hours as needed for wheezing or shortness of breath.   Yes Historical Provider, MD  amLODipine (NORVASC) 5 MG tablet Take 10 mg by mouth at bedtime.    Yes Historical Provider, MD  calcitRIOL (ROCALTROL) 0.5 MCG capsule Take 2 mcg by mouth daily. Takes on Mon, Wed, and Fri with Dialysis   Yes Historical Provider, MD  cinacalcet (SENSIPAR) 90 MG tablet Take 90 mg by mouth daily.  Yes Historical Provider, MD  colchicine 0.6 MG tablet Take 0.6 mg by mouth daily.   Yes Historical Provider, MD  diphenhydrAMINE (BENADRYL) 25 mg capsule Take 50 mg by mouth at bedtime as needed for sleep.   Yes Historical Provider, MD  FLUoxetine (PROZAC) 20 MG capsule Take 20 mg by mouth daily.   Yes Historical Provider, MD  folic acid-vitamin b complex-vitamin c-selenium-zinc (DIALYVITE) 3 MG TABS tablet Take 1 tablet by mouth daily.   Yes Historical Provider, MD  hydrALAZINE (APRESOLINE) 25 MG tablet Take 25 mg by mouth every 8 (eight) hours.   Yes Historical Provider, MD  isosorbide mononitrate (IMDUR) 30 MG 24 hr tablet Take 30 mg by mouth daily.   Yes Historical Provider, MD  lamivudine (EPIVIR-HBV) 5 MG/ML solution Take 5 mLs (25 mg total) by mouth daily. 07/14/14  Yes Gardiner Barefoot, MD  LORazepam (ATIVAN) 0.5 MG tablet Take 0.5 mg by mouth  every 12 (twelve) hours as needed for anxiety.   Yes Historical Provider, MD  omeprazole (PRILOSEC) 20 MG capsule Take 20 mg by mouth daily.   Yes Historical Provider, MD  oxyCODONE-acetaminophen (PERCOCET/ROXICET) 5-325 MG per tablet Take 1-2 tablets by mouth every 6 (six) hours as needed for severe pain. 02/24/15  Yes Gwyneth Sprout, MD  polyethylene glycol (MIRALAX / GLYCOLAX) packet Take 17 g by mouth daily as needed for mild constipation.   Yes Historical Provider, MD  raltegravir (ISENTRESS) 400 MG tablet Take 1 tablet (400 mg total) by mouth 2 (two) times daily. 07/14/14  Yes Gardiner Barefoot, MD  sevelamer carbonate (RENVELA) 800 MG tablet Take 2,400 mg by mouth 3 (three) times daily with meals.   Yes Historical Provider, MD  sevelamer carbonate (RENVELA) 800 MG tablet Take 3 tablets (2,400 mg total) by mouth 3 (three) times daily with meals. 08/14/14  Yes Erasmo Downer, MD  tenofovir (VIREAD) 300 MG tablet Take 1 tablet (300 mg total) by mouth once a week. Give on Friday 07/14/14  Yes Gardiner Barefoot, MD  tiotropium (SPIRIVA) 18 MCG inhalation capsule Place 18 mcg into inhaler and inhale daily.   Yes Historical Provider, MD   Liver Function Tests  Recent Labs Lab 03/03/15 1350 03/09/15 0626  AST  --  14*  ALT  --  10*  ALKPHOS  --  431*  BILITOT  --  0.6  PROT  --  6.6  ALBUMIN 3.0* 3.2*   No results for input(s): LIPASE, AMYLASE in the last 168 hours. CBC  Recent Labs Lab 03/03/15 1350 03/05/15 1031 03/08/15 2052 03/09/15 0626  WBC 8.0  --  8.2 8.8  NEUTROABS  --   --  4.6  --   HGB 9.8* 11.6* 9.7* 9.3*  HCT 30.6* 34.0* 30.5* 29.3*  MCV 108.5*  --  110.5* 110.2*  PLT 242  --  252 240   Basic Metabolic Panel  Recent Labs Lab 03/03/15 1350 03/05/15 1031 03/08/15 2052 03/09/15 0626  NA 136 133* 135 136  K 5.2* 5.5* 5.9* 5.4*  CL 97* 98* 98* 97*  CO2 25  --  22 24  GLUCOSE 119* 119* 101* 79  BUN 51* 44* 60* 65*  CREATININE 9.02* 9.00* 11.12* 11.73*   CALCIUM 6.9*  --  7.9* 7.9*  PHOS 5.4*  --   --   --     Filed Vitals:   03/09/15 0630 03/09/15 0700 03/09/15 0730 03/09/15 0800  BP: 136/73 162/93 148/91 140/90  Pulse: 96 111 108 100  Temp:      TempSrc:      Resp:      Height:      Weight:      SpO2:       Exam Alert, calm no distress No rash, cyanosis or gangrene Sclera anicteric, throat clear No jvd Chest clear bilat RRR no MRG Abd soft ntnd no mass or ascites No leg or UE edema L forearm fistula +bruit Neuro is alert, ox 3, nf   HD: without outpatient unit 4h  Heparin 9800  2/2.25 bath (from Nov '15)   Assessment: 1. Hyperkalemia 2. ESRD does not have an outpatient unit due to compliance / behavior issues 3. Volume some vol excess, no resp compromise 4. HIV   Plan- HD today, max UF  Vinson Moselleob Margan Elias MD (pgr) [phone]    (c) [phone] 03/09/2015, 9:08 AM

## 2015-03-09 NOTE — Clinical Social Work Note (Signed)
Patient medically stable for discharge today and requested cab voucher to be transported [address] in [place]. Voucher completed and given to patient's nurse.  Genelle BalVanessa Chiffon Kittleson, MSW, LCSW Licensed Clinical Social Worker Clinical Social Work Department [facility] [phone]

## 2015-03-10 LAB — COCAINE,MS,WB/SP RFX
Benzoylecgonine: 185 ng/mL
Cocaine Confirmation: POSITIVE
Cocaine: NEGATIVE ng/mL

## 2015-03-10 NOTE — Procedures (Signed)
I was present at this dialysis session, have reviewed the session itself and made  appropriate changes  Vinson Moselleob Adelise Buswell MD (pgr) [phone]    ([phone]) [phone] 03/09/2015, 2:05 PM

## 2015-03-11 ENCOUNTER — Encounter (HOSPITAL_COMMUNITY): Payer: Self-pay | Admitting: Emergency Medicine

## 2015-03-11 ENCOUNTER — Emergency Department (HOSPITAL_COMMUNITY): Payer: Medicare Other

## 2015-03-11 ENCOUNTER — Non-Acute Institutional Stay (HOSPITAL_COMMUNITY)
Admission: EM | Admit: 2015-03-11 | Discharge: 2015-03-12 | Disposition: A | Discharge status: Home / Self Care | Payer: Medicare Other | Attending: Emergency Medicine | Admitting: Emergency Medicine

## 2015-03-11 DIAGNOSIS — F332 Major depressive disorder, recurrent severe without psychotic features: Secondary | ICD-10-CM | POA: Diagnosis present

## 2015-03-11 DIAGNOSIS — F329 Major depressive disorder, single episode, unspecified: Secondary | ICD-10-CM | POA: Diagnosis not present

## 2015-03-11 DIAGNOSIS — Z885 Allergy status to narcotic agent status: Secondary | ICD-10-CM | POA: Insufficient documentation

## 2015-03-11 DIAGNOSIS — R45851 Suicidal ideations: Secondary | ICD-10-CM | POA: Diagnosis not present

## 2015-03-11 DIAGNOSIS — I120 Hypertensive chronic kidney disease with stage 5 chronic kidney disease or end stage renal disease: Secondary | ICD-10-CM | POA: Insufficient documentation

## 2015-03-11 DIAGNOSIS — B20 Human immunodeficiency virus [HIV] disease: Secondary | ICD-10-CM | POA: Insufficient documentation

## 2015-03-11 DIAGNOSIS — Z79899 Other long term (current) drug therapy: Secondary | ICD-10-CM | POA: Insufficient documentation

## 2015-03-11 DIAGNOSIS — R079 Chest pain, unspecified: Secondary | ICD-10-CM | POA: Insufficient documentation

## 2015-03-11 DIAGNOSIS — M1611 Unilateral primary osteoarthritis, right hip: Secondary | ICD-10-CM | POA: Insufficient documentation

## 2015-03-11 DIAGNOSIS — I5042 Chronic combined systolic (congestive) and diastolic (congestive) heart failure: Secondary | ICD-10-CM | POA: Diagnosis not present

## 2015-03-11 DIAGNOSIS — Z6841 Body Mass Index (BMI) 40.0 and over, adult: Secondary | ICD-10-CM | POA: Insufficient documentation

## 2015-03-11 DIAGNOSIS — F411 Generalized anxiety disorder: Secondary | ICD-10-CM | POA: Insufficient documentation

## 2015-03-11 DIAGNOSIS — F17210 Nicotine dependence, cigarettes, uncomplicated: Secondary | ICD-10-CM | POA: Diagnosis not present

## 2015-03-11 DIAGNOSIS — N186 End stage renal disease: Secondary | ICD-10-CM | POA: Insufficient documentation

## 2015-03-11 DIAGNOSIS — K219 Gastro-esophageal reflux disease without esophagitis: Secondary | ICD-10-CM | POA: Diagnosis not present

## 2015-03-11 DIAGNOSIS — Z992 Dependence on renal dialysis: Secondary | ICD-10-CM | POA: Insufficient documentation

## 2015-03-11 DIAGNOSIS — J449 Chronic obstructive pulmonary disease, unspecified: Secondary | ICD-10-CM | POA: Insufficient documentation

## 2015-03-11 DIAGNOSIS — N19 Unspecified kidney failure: Secondary | ICD-10-CM | POA: Diagnosis present

## 2015-03-11 DIAGNOSIS — I509 Heart failure, unspecified: Secondary | ICD-10-CM | POA: Insufficient documentation

## 2015-03-11 DIAGNOSIS — Z888 Allergy status to other drugs, medicaments and biological substances status: Secondary | ICD-10-CM | POA: Diagnosis not present

## 2015-03-11 DIAGNOSIS — D649 Anemia, unspecified: Secondary | ICD-10-CM | POA: Diagnosis not present

## 2015-03-11 LAB — BASIC METABOLIC PANEL
Anion gap: 16 — ABNORMAL HIGH (ref 5–15)
BUN: 53 mg/dL — ABNORMAL HIGH (ref 6–20)
CALCIUM: 8.6 mg/dL — AB (ref 8.9–10.3)
CHLORIDE: 97 mmol/L — AB (ref 101–111)
CO2: 25 mmol/L (ref 22–32)
CREATININE: 10.16 mg/dL — AB (ref 0.44–1.00)
GFR calc Af Amer: 4 mL/min — ABNORMAL LOW (ref >60)
GFR calc non Af Amer: 4 mL/min — ABNORMAL LOW (ref >60)
GLUCOSE: 81 mg/dL (ref 65–99)
POTASSIUM: 5.6 mmol/L — AB (ref 3.5–5.1)
SODIUM: 138 mmol/L (ref 135–145)

## 2015-03-11 LAB — CBC
HCT: 29.9 % — ABNORMAL LOW (ref 36.0–46.0)
Hemoglobin: 9.4 g/dL — ABNORMAL LOW (ref 12.0–15.0)
MCH: 34.8 pg — AB (ref 26.0–34.0)
MCHC: 31.4 g/dL (ref 30.0–36.0)
MCV: 110.7 fL — AB (ref 78.0–100.0)
Platelets: 217 10*3/uL (ref 150–400)
RBC: 2.7 MIL/uL — AB (ref 3.87–5.11)
RDW: 13.6 % (ref 11.5–15.5)
WBC: 8 10*3/uL (ref 4.0–10.5)

## 2015-03-11 LAB — I-STAT TROPONIN, ED: Troponin i, poc: 0 ng/mL (ref 0.00–0.08)

## 2015-03-11 LAB — ETHANOL: Alcohol, Ethyl (B): <5 mg/dL (ref <5)

## 2015-03-11 MED ORDER — SODIUM POLYSTYRENE SULFONATE 15 GM/60ML PO SUSP
45.0000 g | Freq: Once | ORAL | Status: AC
  Administered 2015-03-11: 45 g via ORAL
  Filled 2015-03-11: qty 180

## 2015-03-11 MED ORDER — SEVELAMER CARBONATE 800 MG PO TABS
2400.0000 mg | ORAL_TABLET | Freq: Three times a day (TID) | ORAL | Status: DC
  Administered 2015-03-12 (×2): 2400 mg via ORAL
  Filled 2015-03-11 (×5): qty 3

## 2015-03-11 MED ORDER — LAMIVUDINE 5 MG/ML PO SOLN: 25.0000 mg | Freq: Every day | ORAL | Status: DC

## 2015-03-11 MED ORDER — LORAZEPAM 1 MG PO TABS: 0.5000 mg | ORAL_TABLET | Freq: Two times a day (BID) | ORAL | Status: DC | PRN

## 2015-03-11 MED ORDER — RALTEGRAVIR POTASSIUM 400 MG PO TABS
400.0000 mg | ORAL_TABLET | Freq: Two times a day (BID) | ORAL | Status: DC
  Administered 2015-03-11 – 2015-03-12 (×2): 400 mg via ORAL
  Filled 2015-03-11 (×4): qty 1

## 2015-03-11 MED ORDER — LAMIVUDINE 10 MG/ML PO SOLN
25.0000 mg | Freq: Every day | ORAL | Status: DC
  Administered 2015-03-11 – 2015-03-12 (×2): 25 mg via ORAL
  Filled 2015-03-11 (×3): qty 5

## 2015-03-11 MED ORDER — ALBUTEROL SULFATE HFA 108 (90 BASE) MCG/ACT IN AERS: 2.0000 | INHALATION_SPRAY | Freq: Four times a day (QID) | RESPIRATORY_TRACT | Status: DC | PRN

## 2015-03-11 MED ORDER — HYDRALAZINE HCL 25 MG PO TABS
25.0000 mg | ORAL_TABLET | Freq: Three times a day (TID) | ORAL | Status: DC
  Administered 2015-03-11 – 2015-03-12 (×2): 25 mg via ORAL
  Filled 2015-03-11 (×5): qty 1

## 2015-03-11 MED ORDER — TIOTROPIUM BROMIDE MONOHYDRATE 18 MCG IN CAPS
18.0000 ug | ORAL_CAPSULE | Freq: Every day | RESPIRATORY_TRACT | Status: DC
  Administered 2015-03-11: 18 ug via RESPIRATORY_TRACT
  Filled 2015-03-11: qty 5

## 2015-03-11 MED ORDER — PANTOPRAZOLE SODIUM 40 MG PO TBEC
40.0000 mg | DELAYED_RELEASE_TABLET | Freq: Every day | ORAL | Status: DC
  Administered 2015-03-11 – 2015-03-12 (×2): 40 mg via ORAL
  Filled 2015-03-11 (×2): qty 1

## 2015-03-11 MED ORDER — AMLODIPINE BESYLATE 5 MG PO TABS
10.0000 mg | ORAL_TABLET | Freq: Every day | ORAL | Status: DC
  Administered 2015-03-11: 10 mg via ORAL
  Filled 2015-03-11: qty 2

## 2015-03-11 MED ORDER — ACETAMINOPHEN 325 MG PO TABS
650.0000 mg | ORAL_TABLET | ORAL | Status: DC | PRN
  Administered 2015-03-12: 650 mg via ORAL
  Filled 2015-03-11 (×2): qty 2

## 2015-03-11 MED ORDER — ISOSORBIDE MONONITRATE ER 30 MG PO TB24
30.0000 mg | ORAL_TABLET | Freq: Every day | ORAL | Status: DC
  Administered 2015-03-11 – 2015-03-12 (×2): 30 mg via ORAL
  Filled 2015-03-11 (×3): qty 1

## 2015-03-11 NOTE — ED Notes (Signed)
Pt reports no medications for a week.

## 2015-03-11 NOTE — ED Notes (Signed)
Per EMS, pt c/o sharp chest pain that "comes and goes".  Pain started last night, no pain at this time.  Vitals are "stable". Pt is due for dialysis today.  She reports that the chest pain could be from "all the stress.....she is homeless and her daughter is stealing from her."  She reports that she "does not want to go on like this anymore...feeling like killing myself."  She refused all treatment w/ EMS but has been compliant while at the hospital.

## 2015-03-11 NOTE — ED Notes (Signed)
Night time snack provided 

## 2015-03-11 NOTE — ED Provider Notes (Signed)
CSN: [id number]     Arrival date & time 03/11/15  0914 History   First MD Initiated Contact with Patient 03/11/15 [phone]     Chief Complaint  Patient presents with  . Chest Pain  . Suicidal     (Consider location/radiation/quality/duration/timing/severity/associated sxs/prior Treatment) HPI  Pt presenting via EMS- she states to me she called EMS due to right arm pain which is now resolved.  She then goes on to say that she does not want to live anymore.  She last had dialysis Tuesday morning- would be due today, but states she does not want to go to dialysis because she wants to stop dialysis and let herself die.  She states she has been living with her daughter and her daughter's boyfriend, states they kept her up all night long last night- coming and going all night.  States she could not sleep at all.  This morning called EMS as she was overwhelmed by her living situation, doesn't want to live there anymore and wants to die.  No chest pain, no shortness of breath, no palpitaitons, no leg swelling.  There are no other associated systemic symptoms, there are no other alleviating or modifying factors.   Past Medical History  Diagnosis Date  . Hypertension   . HIV infection   . Morbid obesity   . ESRD (end stage renal disease) on dialysis 09/30/2013    [place] in [place] around 2009.  ESRD due to HTN vs drug abuse according to pt.  [facility] until Feb 2015 when she was admitted to a SNF due to homelessness and drug abuse.  Then [facility] on TTS schedule.     . Anginal pain   . CHF (congestive heart failure)   . Shortness of breath   . Pneumonia   . Depression   . Anxiety   . GERD (gastroesophageal reflux disease)   . Arthritis   . Anemia   . Hyperkalemia 08/11/2014   Past Surgical History  Procedure Laterality Date  . Arteriovenous graft placement      left forearm  . Cardiac catheterization    . Laparotomy      states seh was cut open  because seh was bleeding on the inside   Family History  Problem Relation Age of Onset  . Kidney failure Other     niece  . High blood pressure    . Lung cancer    . Breast cancer Neg Hx   . Colon cancer Neg Hx   . Stroke Mother   . HIV/AIDS Brother     died of AIDS   History  Substance Use Topics  . Smoking status: Current Every Day Smoker -- 0.50 packs/day    Types: Cigarettes    Start date: 05/06/2014  . Smokeless tobacco: Never Used     Comment: recently quit  . Alcohol Use: No   OB History    No data available     Review of Systems  ROS reviewed and all otherwise negative except for mentioned in HPI    Allergies  Trazodone and nefazodone and Morphine and related  Home Medications   Prior to Admission medications   Medication Sig Start Date End Date Taking? Authorizing Provider  albuterol (PROVENTIL HFA;VENTOLIN HFA) 108 (90 BASE) MCG/ACT inhaler Inhale 2 puffs into the lungs every 6 (six) hours as needed for wheezing or shortness of breath.   Yes Historical Provider, MD  amLODipine (NORVASC)  5 MG tablet Take 10 mg by mouth at bedtime.    Yes Historical Provider, MD  calcitRIOL (ROCALTROL) 0.5 MCG capsule Take 2 mcg by mouth daily. Takes on Mon, Wed, and Fri with Dialysis   Yes Historical Provider, MD  cinacalcet (SENSIPAR) 90 MG tablet Take 90 mg by mouth daily.   Yes Historical Provider, MD  colchicine 0.6 MG tablet Take 0.6 mg by mouth daily.   Yes Historical Provider, MD  diphenhydrAMINE (BENADRYL) 25 mg capsule Take 50 mg by mouth at bedtime as needed for sleep.   Yes Historical Provider, MD  FLUoxetine (PROZAC) 20 MG capsule Take 20 mg by mouth daily.   Yes Historical Provider, MD  folic acid-vitamin b complex-vitamin c-selenium-zinc (DIALYVITE) 3 MG TABS tablet Take 1 tablet by mouth daily.   Yes Historical Provider, MD  hydrALAZINE (APRESOLINE) 25 MG tablet Take 25 mg by mouth every 8 (eight) hours.   Yes Historical Provider, MD  isosorbide mononitrate  (IMDUR) 30 MG 24 hr tablet Take 30 mg by mouth daily.   Yes Historical Provider, MD  lamivudine (EPIVIR-HBV) 5 MG/ML solution Take 5 mLs (25 mg total) by mouth daily. 07/14/14  Yes Gardiner Barefoot, MD  LORazepam (ATIVAN) 0.5 MG tablet Take 0.5 mg by mouth every 12 (twelve) hours as needed for anxiety.   Yes Historical Provider, MD  omeprazole (PRILOSEC) 20 MG capsule Take 20 mg by mouth daily.   Yes Historical Provider, MD  oxyCODONE-acetaminophen (PERCOCET/ROXICET) 5-325 MG per tablet Take 1-2 tablets by mouth every 6 (six) hours as needed for severe pain. 02/24/15  Yes Gwyneth Sprout, MD  polyethylene glycol (MIRALAX / GLYCOLAX) packet Take 17 g by mouth daily as needed for mild constipation.   Yes Historical Provider, MD  raltegravir (ISENTRESS) 400 MG tablet Take 1 tablet (400 mg total) by mouth 2 (two) times daily. 07/14/14  Yes Gardiner Barefoot, MD  sevelamer carbonate (RENVELA) 800 MG tablet Take 2,400 mg by mouth 3 (three) times daily with meals.   Yes Historical Provider, MD  sevelamer carbonate (RENVELA) 800 MG tablet Take 3 tablets (2,400 mg total) by mouth 3 (three) times daily with meals. 08/14/14  Yes Erasmo Downer, MD  tenofovir (VIREAD) 300 MG tablet Take 1 tablet (300 mg total) by mouth once a week. Give on Friday 07/14/14  Yes Gardiner Barefoot, MD  tiotropium (SPIRIVA) 18 MCG inhalation capsule Place 18 mcg into inhaler and inhale daily.   Yes Historical Provider, MD   BP 160/69 mmHg  Pulse 88  Temp(Src) 97.9 F (36.6 C) (Oral)  Resp 20  Ht  (1.626 m)  Wt 254 lb (115.214 kg)  BMI 43.58 kg/m2  SpO2 95%  Vitals reviewed Physical Exam  Physical Examination: General appearance - alert, well appearing, and in no distress Mental status - alert, oriented to person, place, and time Eyes - no conjunctival injection no scleral icterus Mouth - mucous membranes moist, pharynx normal without lesions Chest - clear to auscultation, no wheezes, rales or rhonchi, symmetric air  entry Heart - normal rate, regular rhythm, normal S1, S2, no murmurs, rubs, clicks or gallops Abdomen - soft, nontender, nondistended, no masses or organomegaly Extremities - peripheral pulses normal, no pedal edema, no clubbing or cyanosis Skin - normal coloration and turgor, no rashes Psych- flat affect  ED Course  Procedures (including critical care time) Labs Review Labs Reviewed  CBC - Abnormal; Notable for the following:    RBC 2.70 (*)    Hemoglobin  9.4 (*)    HCT 29.9 (*)    MCV 110.7 (*)    MCH 34.8 (*)    All other components within normal limits  BASIC METABOLIC PANEL - Abnormal; Notable for the following:    Potassium 5.6 (*)    Chloride 97 (*)    BUN 53 (*)    Creatinine, Ser 10.16 (*)    Calcium 8.6 (*)    GFR calc non Af Amer 4 (*)    GFR calc Af Amer 4 (*)    Anion gap 16 (*)    All other components within normal limits  ETHANOL  I-STAT TROPOININ, ED    Imaging Review Dg Chest 2 View  03/11/2015   CLINICAL DATA:  Chest pain and shortness of breath. End-stage renal disease. HIV infection.  EXAM: CHEST  2 VIEW  COMPARISON:  Mar 08, 2015  FINDINGS: There is mild mid lung atelectatic change on the left. Lungs elsewhere clear. Heart size is upper normal with pulmonary vascular within normal limits. No adenopathy. There are sclerotic areas along the superior inferior endplates of most of the vertebral bodies in the thoracic spine.  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 Mild atelectasis left mid lung. No edema or consolidation.   Electronically Signed   By: Bretta Bang III M.D.   On: 03/11/2015 10:25     EKG Interpretation   Date/Time:  Thursday March 11 2015 09:29:59 EDT Ventricular Rate:  90 PR Interval:  176 QRS Duration: 110 QT Interval:  399 QTC Calculation: 488 R Axis:   43 Text Interpretation:  Sinus rhythm Borderline prolonged QT interval Since  previous tracing QT  interval somewhat increased Confirmed by Karma Ganja  MD,  MARTHA [phone]) on 03/11/2015 10:00:29 AM      MDM   Final diagnoses:  Suicidal ideation  End stage renal disease on dialysis  Pt presenting with c/o feeling suicidal- she had told EMS she had chest pain, but denies chest pain to me- states she had right arm pain which is resolved.  Workup for chest pain reassuring in the ED.  Her primary complaint is that she does not feel safe where she lives, she feels suicidal and wants to refuse dialysis in order to kill herself.     12:18 PM pt has been seen by TTS, they are planning for inpatient psych treatment.  Will need to be admitted to a facility where they can do dialysis- they are working on placement.  If she is here overnight she may need to be evaluated for dialysis tomorrow- but currently she is refusing dialysis.     Jerelyn Scott, MD 03/11/15 1550

## 2015-03-11 NOTE — ED Notes (Signed)
Pt given a Mal[place]key sandwich.  Commode at bedside for pt comfort.

## 2015-03-11 NOTE — ED Notes (Signed)
Telepsy in process 

## 2015-03-11 NOTE — ED Notes (Signed)
Pt needed to be waken several times as RN was attempting to assess.  She reports that she was unable to sleep at her daughters due to feeling unsafe.  Provider notified.

## 2015-03-11 NOTE — [facility] Counselor (Signed)
Pt. Pending [facility] with [facility] for [facility] placement.

## 2015-03-11 NOTE — Progress Notes (Addendum)
Patient to be referred for IP treatment/followed up at: [facility] - Per [place], fax referral for review, 1 bed open. Referral faxed.  Declined at: [facility] -  Per Gregary CromerKalvin, pt declined due to lacking acuity. See Kalvin's note.  At capacity: [facility] - per Jody, no appropriate beds for this patient, only low acuity female and female beds. [facility] - [facility], no beds at moment, call back in am. [facility] - per intake. [facility] - per Michele Mcalpinehil  CSW will continue to seek placement.  Melbourne Abtsatia Fariha Goto, LCSWA Disposition staff 03/11/2015 6:20 PM

## 2015-03-11 NOTE — ED Notes (Signed)
Called staffing and requested sitter
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
## 2015-03-11 NOTE — ED Notes (Signed)
Requested daily meds from EDP.

## 2015-03-11 NOTE — ED Notes (Signed)
Pt back in room from X-ray.

## 2015-03-11 NOTE — ED Notes (Signed)
Reported pain medication request to Dr. Donnald GarrePfeiffer, generalized pain, dialysis and HIV patient. MD acknowledges, gives telephone verbal order for Tylenol 650mg  q4h prn.

## 2015-03-11 NOTE — ED Notes (Signed)
Notified RN that Patient seemed unusually sleepy.

## 2015-03-11 NOTE — ED Notes (Signed)
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

## 2015-03-11 NOTE — BHH Counselor (Signed)
Pt. declined at [facility]. Per Dr. Ardyth HarpsHernandez, "Declined due to not meeting criteria, chronic suicidality with no plan and [facility] her. This is a social issue. Pt. will benefit from placement, in an assisted living facility."

## 2015-03-12 ENCOUNTER — Emergency Department (HOSPITAL_COMMUNITY)
Admission: EM | Admit: 2015-03-12 | Discharge: 2015-03-12 | Disposition: A | Discharge status: Home / Self Care | Payer: Medicare Other | Attending: Emergency Medicine | Admitting: Emergency Medicine

## 2015-03-12 ENCOUNTER — Encounter (HOSPITAL_COMMUNITY): Payer: Self-pay | Admitting: Emergency Medicine

## 2015-03-12 DIAGNOSIS — Z992 Dependence on renal dialysis: Secondary | ICD-10-CM | POA: Diagnosis not present

## 2015-03-12 DIAGNOSIS — Z8739 Personal history of other diseases of the musculoskeletal system and connective tissue: Secondary | ICD-10-CM | POA: Insufficient documentation

## 2015-03-12 DIAGNOSIS — Z8701 Personal history of pneumonia (recurrent): Secondary | ICD-10-CM | POA: Diagnosis not present

## 2015-03-12 DIAGNOSIS — Z21 Asymptomatic human immunodeficiency virus [HIV] infection status: Secondary | ICD-10-CM | POA: Insufficient documentation

## 2015-03-12 DIAGNOSIS — R079 Chest pain, unspecified: Secondary | ICD-10-CM | POA: Diagnosis not present

## 2015-03-12 DIAGNOSIS — I509 Heart failure, unspecified: Secondary | ICD-10-CM | POA: Insufficient documentation

## 2015-03-12 DIAGNOSIS — I120 Hypertensive chronic kidney disease with stage 5 chronic kidney disease or end stage renal disease: Secondary | ICD-10-CM | POA: Diagnosis not present

## 2015-03-12 DIAGNOSIS — D649 Anemia, unspecified: Secondary | ICD-10-CM | POA: Insufficient documentation

## 2015-03-12 DIAGNOSIS — I209 Angina pectoris, unspecified: Secondary | ICD-10-CM | POA: Diagnosis not present

## 2015-03-12 DIAGNOSIS — K219 Gastro-esophageal reflux disease without esophagitis: Secondary | ICD-10-CM | POA: Diagnosis not present

## 2015-03-12 DIAGNOSIS — Z79899 Other long term (current) drug therapy: Secondary | ICD-10-CM | POA: Diagnosis not present

## 2015-03-12 DIAGNOSIS — F32A Depression, unspecified: Secondary | ICD-10-CM

## 2015-03-12 DIAGNOSIS — Z5900 Homelessness unspecified: Secondary | ICD-10-CM

## 2015-03-12 DIAGNOSIS — F419 Anxiety disorder, unspecified: Secondary | ICD-10-CM | POA: Insufficient documentation

## 2015-03-12 DIAGNOSIS — Z720 Tobacco use: Secondary | ICD-10-CM | POA: Insufficient documentation

## 2015-03-12 DIAGNOSIS — N186 End stage renal disease: Secondary | ICD-10-CM | POA: Insufficient documentation

## 2015-03-12 DIAGNOSIS — R45851 Suicidal ideations: Secondary | ICD-10-CM | POA: Diagnosis present

## 2015-03-12 DIAGNOSIS — F329 Major depressive disorder, single episode, unspecified: Secondary | ICD-10-CM | POA: Insufficient documentation

## 2015-03-12 DIAGNOSIS — F332 Major depressive disorder, recurrent severe without psychotic features: Secondary | ICD-10-CM | POA: Diagnosis not present

## 2015-03-12 DIAGNOSIS — N19 Unspecified kidney failure: Secondary | ICD-10-CM | POA: Diagnosis present

## 2015-03-12 HISTORY — DX: Homelessness: Z59.0

## 2015-03-12 HISTORY — DX: Homelessness unspecified: Z59.00

## 2015-03-12 LAB — COMPREHENSIVE METABOLIC PANEL
ALK PHOS: 483 U/L — AB (ref 38–126)
ALT: 10 U/L — ABNORMAL LOW (ref 14–54)
ANION GAP: 13 (ref 5–15)
AST: 18 U/L (ref 15–41)
Albumin: 3.6 g/dL (ref 3.5–5.0)
BILIRUBIN TOTAL: 0.6 mg/dL (ref 0.3–1.2)
BUN: 29 mg/dL — ABNORMAL HIGH (ref 6–20)
CO2: 26 mmol/L (ref 22–32)
CREATININE: 6.82 mg/dL — AB (ref 0.44–1.00)
Calcium: 8.8 mg/dL — ABNORMAL LOW (ref 8.9–10.3)
Chloride: 94 mmol/L — ABNORMAL LOW (ref 101–111)
GFR calc Af Amer: 7 mL/min — ABNORMAL LOW (ref >60)
GFR calc non Af Amer: 6 mL/min — ABNORMAL LOW (ref >60)
GLUCOSE: 127 mg/dL — AB (ref 65–99)
Potassium: 4 mmol/L (ref 3.5–5.1)
SODIUM: 133 mmol/L — AB (ref 135–145)
Total Protein: 7.7 g/dL (ref 6.5–8.1)

## 2015-03-12 LAB — CBC
HCT: 28.4 % — ABNORMAL LOW (ref 36.0–46.0)
HEMATOCRIT: 31.9 % — AB (ref 36.0–46.0)
Hemoglobin: 9 g/dL — ABNORMAL LOW (ref 12.0–15.0)
Hemoglobin: 10.3 g/dL — ABNORMAL LOW (ref 12.0–15.0)
MCH: 34.6 pg — ABNORMAL HIGH (ref 26.0–34.0)
MCH: 34.8 pg — ABNORMAL HIGH (ref 26.0–34.0)
MCHC: 31.7 g/dL (ref 30.0–36.0)
MCHC: 32.3 g/dL (ref 30.0–36.0)
MCV: 109.2 fL — ABNORMAL HIGH (ref 78.0–100.0)
MCV: 107.8 fL — ABNORMAL HIGH (ref 78.0–100.0)
PLATELETS: 237 10*3/uL (ref 150–400)
Platelets: 213 10*3/uL (ref 150–400)
RBC: 2.6 MIL/uL — ABNORMAL LOW (ref 3.87–5.11)
RBC: 2.96 MIL/uL — ABNORMAL LOW (ref 3.87–5.11)
RDW: 13.3 % (ref 11.5–15.5)
RDW: 13.3 % (ref 11.5–15.5)
WBC: 7.3 10*3/uL (ref 4.0–10.5)
WBC: 6.7 10*3/uL (ref 4.0–10.5)

## 2015-03-12 LAB — DRUG SCREEN 10 W/CONF, SERUM
Amphetamines, IA: NEGATIVE ng/mL
Barbiturates, IA: NEGATIVE ug/mL
Benzodiazepines, IA: NEGATIVE ng/mL
Cocaine & Metabolite, IA: POSITIVE ng/mL
Methadone, IA: NEGATIVE ng/mL
Opiates, IA: NEGATIVE ng/mL
Oxycodones, IA: NEGATIVE ng/mL
Phencyclidine, IA: NEGATIVE ng/mL
Propoxyphene, IA: NEGATIVE ng/mL
THC(Marijuana) Metabolite, IA: POSITIVE ng/mL

## 2015-03-12 LAB — THC,MS,WB/SP RFX
Cannabidiol: NEGATIVE ng/mL
Cannabinoid Confirmation: POSITIVE
Cannabinol: NEGATIVE ng/mL
Carboxy-THC: 1.4 ng/mL
Hydroxy-THC: NEGATIVE ng/mL
Tetrahydrocannabinol(THC): NEGATIVE ng/mL

## 2015-03-12 LAB — RENAL FUNCTION PANEL
Albumin: 3.1 g/dL — ABNORMAL LOW (ref 3.5–5.0)
Anion gap: 16 — ABNORMAL HIGH (ref 5–15)
BUN: 65 mg/dL — AB (ref 6–20)
CALCIUM: 8 mg/dL — AB (ref 8.9–10.3)
CHLORIDE: 97 mmol/L — AB (ref 101–111)
CO2: 24 mmol/L (ref 22–32)
Creatinine, Ser: 11.78 mg/dL — ABNORMAL HIGH (ref 0.44–1.00)
GFR calc Af Amer: 3 mL/min — ABNORMAL LOW (ref >60)
GFR, EST NON AFRICAN AMERICAN: 3 mL/min — AB (ref >60)
Glucose, Bld: 128 mg/dL — ABNORMAL HIGH (ref 65–99)
POTASSIUM: 4.6 mmol/L (ref 3.5–5.1)
Phosphorus: 7.4 mg/dL — ABNORMAL HIGH (ref 2.5–4.6)
SODIUM: 137 mmol/L (ref 135–145)

## 2015-03-12 LAB — SALICYLATE LEVEL

## 2015-03-12 LAB — ACETAMINOPHEN LEVEL: Acetaminophen (Tylenol), Serum: <10 ug/mL — ABNORMAL LOW (ref 10–30)

## 2015-03-12 LAB — ETHANOL

## 2015-03-12 MED ORDER — HEPARIN SODIUM (PORCINE) 1000 UNIT/ML DIALYSIS: 1000.0000 [IU] | INTRAMUSCULAR | Status: DC | PRN

## 2015-03-12 MED ORDER — LIDOCAINE-PRILOCAINE 2.5-2.5 % EX CREA: 1.0000 "application " | TOPICAL_CREAM | CUTANEOUS | Status: DC | PRN

## 2015-03-12 MED ORDER — SODIUM CHLORIDE 0.9 % IV SOLN: 100.0000 mL | INTRAVENOUS | Status: DC | PRN

## 2015-03-12 MED ORDER — HEPARIN SODIUM (PORCINE) 1000 UNIT/ML DIALYSIS: 8000.0000 [IU] | Freq: Once | INTRAMUSCULAR | Status: DC

## 2015-03-12 MED ORDER — PENTAFLUOROPROP-TETRAFLUOROETH EX AERO: 1.0000 "application " | INHALATION_SPRAY | CUTANEOUS | Status: DC | PRN

## 2015-03-12 MED ORDER — NEPRO/CARBSTEADY PO LIQD: 237.0000 mL | ORAL | Status: DC | PRN

## 2015-03-12 MED ORDER — ALTEPLASE 2 MG IJ SOLR: 2.0000 mg | Freq: Once | INTRAMUSCULAR | Status: DC | PRN

## 2015-03-12 MED ORDER — LIDOCAINE HCL (PF) 1 % IJ SOLN: 5.0000 mL | INTRAMUSCULAR | Status: DC | PRN

## 2015-03-12 NOTE — Discharge Instructions (Signed)
Substance Abuse Treatment Programs ° °Intensive Outpatient Programs °[facility]     °[address]      °[place], [place]                   °[phone]      ° °[facility] °[address] °[place], [place] °[phone] ° °[facility]     °(Inpatient and outpatient)     °[address]           °[phone]   ° °[facility] °336-288-1484 (Suboxone and Methadone) ° °[address]      °[place], [place] [address]      °336-882-2125      ° °[address] °[place], [place] °852-3033 ° °[facility] (Outpatient/Inpatient, [facility])    °(insurance only) [phone]      °       °[facility]Groups & Residential) °[place], [place] °[phone] ° °   °[facility]     °[address]     °[place], [place]      °[phone]      ° °[facility] (for caregivers and family) °[address] °[place], [place] °336-299-4655 ° ° ° ° ° °Residential Treatment Programs °[facility]      °[address], [place], [place] [address]  °([phone]      ° °[facility] °[address] , [place] [address] °[phone] ° °[facility]        °336-248-8914      ° °[facility] °1-800-659-3381 ° °[facility]             °[address]                                         °[place], [place]                                                °877-615-2722 or 336-784-9470                              ° °[facility] [place] °[address] °[place], [address] °1.877.941.8954 ° °[facility]    °[address]      °[place], [place]     °[phone]      ° °[facility] °[address] °[place], [place] °336-285-9073 ° °[facility]   °[address]     °[place], [place] [address]     °[phone]      °Admissions: 8am-3pm M-F ° °[facility] °[address] °[place],  [place] °336-227-7417 ° °[facility]: Residential Program (90 Days)   °[place], [place]      °336-725-8389 or 800-758-6077    ° °[facility] °[place], [place] °(Walk in Hours over the weekend or by referral) ° °[facility] °[address], [place], [place] [address] °([phone] ° °Crisis Mobile: [facility]:  1-877-626-1772 (for crisis response 24 hours a day) °[facility] Hotline:      1-800-256-2452 °Outpatient Psychiatry and Counseling ° °[facility]: Mobile Crisis   Management 24 hours:  1-877-626-1772 ° °[facility] sliding scale fee and walk in schedule: M-F 8am-12pm/1pm-3pm °[address]  °[place], [place] [address] °[phone] ° °[facility] °[address] °[place], [place] [address] °336-703-9650 ° °[facility] ([facility])- new patient walk-in appointments available Monday - Friday 8am -3pm.          °[address] °[place], [place] [address] °336-676-6840 or crisis line- 336-676-6905 ° °[facility]/ Intensive Outpatient Therapy Program °[address] °[place], [place] [address] °336-832-9804 ° °[facility]                  °Crisis Services      °[phone]      °[address]     °[place], [place] [address]                ° °[facility]   °[facility] °[phone] °[address] °[place], [place] [address] ° ° °[facility]          °[address]  °[place], [place] [address]       °([phone] ° °[facility] °[address] °[place], [place] [address] °336-292-1510 ° °[facility]    °[address]    °[place], [place] [address]     °[phone]      ° °Parish McKinney, MD     °[address]     °[place] [address]     °[phone]     °  °[facility] °[address] °[place] [address] ° °[facility]     °[address]     °[place], [place]      °336-542-2076      ° °[facility] °Shamsher Ahluwalia, MD °[address] °[place], [place] [address] °[phone] ° °[facility]     °[address]     °[place], [place] [address]     °336-274-1237      ° °[facility] °[address] °[place], [place] [address] °336-854-4450 °Resources for Temporary Residential Assistance/Crisis Centers ° °DAY CENTERS °[facility] °M-F 8am-3pm   °[address] [place], [place] [address]   336-332-0824 °Services include: laundry, barbering, support groups, case management, phone  & computer access, showers, AA/NA mtgs, mental health/substance abuse nurse, job skills class, disability information, VA assistance, spiritual classes, etc.  ° °HOMELESS SHELTERS ° °[facility]     °[facility]   °[address], [place]     °[phone]       °       °[facility] (women and children)       °[address] °[place], [place] [address] °336-275-0820 °[email or web address] for application and process °Application Required ° °[facility]   °[address]    °[place] [address]     °[phone]       °             °[facility] °[address] °, [place] [address] °336.273.5572 °336-235-0363(schedule application appt.) °Application Required ° °[facility] (women only)    °[address]     °[place], [place] [address]     °336-884-1039      °  Intake starts 6pm daily °Need valid ID, SSC, & Police report °[facility] [place] °[address] °[place], [place] °336-881-5420 °Application Required ° °[facility] (men only)     °[address]      °[place], [place]     °[phone]      ° °[facility] °(Pregnant women only) °[address] °[place], [place] °336-275-0206 ° °[facility]      °[address]      °[place], [place] [address]     °[phone]      °       °[facility] °[address] °[place], [place] °336-723-1848 °90 day commitment/SA/Application process ° °[facility](men only)     °[address]     °[place], [place]     °336-748-1962       °Check-in at 7pm     °       °[facility] °[address] °[place],  [address] °336-248-6684 °Men/Women/Women and Children must be there by 7 pm ° °[facility] °[place],  °336-722-8721                ° °

## 2015-03-12 NOTE — ED Notes (Signed)
Staffing made aware of need for sitter also charge nurse made aware.  All belongings removed and placed in wine colored scrubs

## 2015-03-12 NOTE — Procedures (Signed)
I was present at this dialysis session, have reviewed the session itself and made  appropriate changes  Vinson Moselleob Siren Porrata MD (pgr) [phone]    [phone] 03/12/2015, 2:14 PM
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
## 2015-03-12 NOTE — ED Notes (Signed)
Pt to ED with c/o being suicidal.  St's she is homeless and has no where to go.  Pt also had dialysis earlier today but she does not have a regular dialysis center,.  Pt admits to taking 5 [place] 3mg  tablets before coming into ED.  Pt was seen yesterday for same and discharged

## 2015-03-12 NOTE — Care Management (Signed)
Patient medically stable for discharge today and requested cab voucher to be transported [address] [place], [place] Spoke with [place] CSW covering at [employer], voucher completed and given to patient's nurse Lynwood DawleyYesemia,RN

## 2015-03-12 NOTE — ED Provider Notes (Signed)
CSN: [id number]     Arrival date & time 03/12/15  1745 History   First MD Initiated Contact with Patient 03/12/15 1938     Chief Complaint  Patient presents with  . Suicidal   HPI Patient presents to the emergency room with complaints of depression and hopelessness. Patient does have a history of depression, homelessness, and end-stage renal disease. The patient was in the emergency department earlier this morning after being observed overnight for similar symptoms. Patient has actually been seen multiple times in the emergency department for similar complaints. Patient is very upset that she does not have a place to live. Patient is post to be living with her daughter but her daughter has not been treating her well. Patient denies any specific plan but she is depressed and states she doesn't want to continue living this way. Past Medical History  Diagnosis Date  . Hypertension   . HIV infection   . Morbid obesity   . ESRD (end stage renal disease) on dialysis 09/30/2013    Started dialysis in [place], [place] around 2009.  ESRD due to HTN vs drug abuse according to pt.  Was on dialysis at [facility] until Feb 2015 when she was admitted to a SNF due to homelessness and drug abuse.  Then changed to [facility] on TTS schedule.     . Anginal pain   . CHF (congestive heart failure)   . Shortness of breath   . Pneumonia   . Depression   . Anxiety   . GERD (gastroesophageal reflux disease)   . Arthritis   . Anemia   . Hyperkalemia 08/11/2014  . Homeless    Past Surgical History  Procedure Laterality Date  . Arteriovenous graft placement      left forearm  . Cardiac catheterization    . Laparotomy      states seh was cut open because seh was bleeding on the inside   Family History  Problem Relation Age of Onset  . Kidney failure Other     niece  . High blood pressure    . Lung cancer    . Breast cancer Neg Hx   . Colon cancer Neg Hx   . Stroke Mother   . HIV/AIDS Brother     died of  AIDS   History  Substance Use Topics  . Smoking status: Current Every Day Smoker -- 0.50 packs/day    Types: Cigarettes    Start date: 05/06/2014  . Smokeless tobacco: Never Used     Comment: recently quit  . Alcohol Use: No   OB History    No data available     Review of Systems  All other systems reviewed and are negative.     Allergies  Trazodone and nefazodone and Morphine and related  Home Medications   Prior to Admission medications   Medication Sig Start Date End Date Taking? Authorizing Provider  albuterol (PROVENTIL HFA;VENTOLIN HFA) 108 (90 BASE) MCG/ACT inhaler Inhale 2 puffs into the lungs every 6 (six) hours as needed for wheezing or shortness of breath.    Historical Provider, MD  amLODipine (NORVASC) 5 MG tablet Take 10 mg by mouth at bedtime.     Historical Provider, MD  calcitRIOL (ROCALTROL) 0.5 MCG capsule Take 2 mcg by mouth daily. Takes on Mon, Wed, and Fri with Dialysis    Historical Provider, MD  cinacalcet (SENSIPAR) 90 MG tablet Take 90 mg by mouth daily.    Historical Provider, MD  colchicine 0.6 MG tablet Take 0.6 mg by mouth daily.    Historical Provider, MD  diphenhydrAMINE (BENADRYL) 25 mg capsule Take 50 mg by mouth at bedtime as needed for sleep.    Historical Provider, MD  FLUoxetine (PROZAC) 20 MG capsule Take 20 mg by mouth daily.    Historical Provider, MD  folic acid-vitamin b complex-vitamin c-selenium-zinc (DIALYVITE) 3 MG TABS tablet Take 1 tablet by mouth daily.    Historical Provider, MD  hydrALAZINE (APRESOLINE) 25 MG tablet Take 25 mg by mouth every 8 (eight) hours.    Historical Provider, MD  isosorbide mononitrate (IMDUR) 30 MG 24 hr tablet Take 30 mg by mouth daily.    Historical Provider, MD  lamivudine (EPIVIR-HBV) 5 MG/ML solution Take 5 mLs (25 mg total) by mouth daily. 07/14/14   Gardiner Barefootobert W Comer, MD  LORazepam (ATIVAN) 0.5 MG tablet Take 0.5 mg by mouth every 12 (twelve) hours as needed for anxiety.    Historical Provider, MD   omeprazole (PRILOSEC) 20 MG capsule Take 20 mg by mouth daily.    Historical Provider, MD  oxyCODONE-acetaminophen (PERCOCET/ROXICET) 5-325 MG per tablet Take 1-2 tablets by mouth every 6 (six) hours as needed for severe pain. 02/24/15   Gwyneth SproutWhitney Plunkett, MD  polyethylene glycol (MIRALAX / GLYCOLAX) packet Take 17 g by mouth daily as needed for mild constipation.    Historical Provider, MD  raltegravir (ISENTRESS) 400 MG tablet Take 1 tablet (400 mg total) by mouth 2 (two) times daily. 07/14/14   Gardiner Barefootobert W Comer, MD  sevelamer carbonate (RENVELA) 800 MG tablet Take 2,400 mg by mouth 3 (three) times daily with meals.    Historical Provider, MD  sevelamer carbonate (RENVELA) 800 MG tablet Take 3 tablets (2,400 mg total) by mouth 3 (three) times daily with meals. 08/14/14   Erasmo DownerAngela M Bacigalupo, MD  tenofovir (VIREAD) 300 MG tablet Take 1 tablet (300 mg total) by mouth once a week. Give on Friday 07/14/14   Gardiner Barefootobert W Comer, MD  tiotropium (SPIRIVA) 18 MCG inhalation capsule Place 18 mcg into inhaler and inhale daily.    Historical Provider, MD   BP 168/97 mmHg  Pulse 94  Temp(Src) 98 F (36.7 C) (Oral)  Resp 20  Ht 5\' 4"  (1.626 m)  Wt 254 lb (115.214 kg)  BMI 43.58 kg/m2  SpO2 98% Physical Exam  Constitutional: No distress.  Obese  HENT:  Head: Normocephalic and atraumatic.  Right Ear: External ear normal.  Left Ear: External ear normal.  Eyes: Conjunctivae are normal. Right eye exhibits no discharge. Left eye exhibits no discharge. No scleral icterus.  Neck: Neck supple. No tracheal deviation present.  Cardiovascular: Normal rate, regular rhythm and intact distal pulses.   Pulmonary/Chest: Effort normal and breath sounds normal. No stridor. No respiratory distress. She has no wheezes. She has no rales.  Abdominal: Soft. Bowel sounds are normal. She exhibits no distension. There is no tenderness. There is no rebound and no guarding.  Musculoskeletal: She exhibits no edema or tenderness.   Neurological: She is alert. She has normal strength. No cranial nerve deficit (no facial droop, extraocular movements intact, no slurred speech) or sensory deficit. She exhibits normal muscle tone. She displays no seizure activity. Coordination normal.  Skin: Skin is warm and dry. No rash noted.  Psychiatric: Her speech is not delayed, not tangential and not slurred. She is not withdrawn and not actively hallucinating. Thought content is not delusional. She exhibits a depressed mood. She expresses no homicidal and no  suicidal ideation. She expresses no suicidal plans and no homicidal plans.  Nursing note and vitals reviewed.   ED Course  Procedures (including critical care time) Labs Review Labs Reviewed  CBC - Abnormal; Notable for the following:    RBC 2.96 (*)    Hemoglobin 10.3 (*)    HCT 31.9 (*)    MCV 107.8 (*)    MCH 34.8 (*)    All other components within normal limits  COMPREHENSIVE METABOLIC PANEL  ETHANOL  ACETAMINOPHEN LEVEL  SALICYLATE LEVEL  URINE RAPID DRUG SCREEN (HOSP PERFORMED) NOT AT [facility]    Imaging Review Dg Chest 2 View  03/11/2015   CLINICAL DATA:  Chest pain and shortness of breath. End-stage renal disease. HIV infection.  EXAM: CHEST  2 VIEW  COMPARISON:  Mar 08, 2015  FINDINGS: There is mild mid lung atelectatic change on the left. Lungs elsewhere clear. Heart size is upper normal with pulmonary vascular within normal limits. No adenopathy. There are sclerotic areas along the superior inferior endplates of most of the vertebral bodies in the thoracic spine.  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 Mild atelectasis left mid lung. No edema or consolidation.   Electronically Signed   By: Bretta Bang III M.D.   On: 03/11/2015 10:25     EKG Interpretation None      MDM   Final diagnoses:  Depression  Homelessness    The patient was just assessed by the psychiatry team  earlier this morning. She did not meet inpatient criteria. The patient has been frequently coming to the emergency department because of her concerns about her living situation in her home life. Patient does not have any active plans. Not believe that she is a danger to herself. It is unfortunate that she does not have good family support. We contacted social workers but apparently the patient has a history of drugabuse and when they have attempted to place her and living facility she has not been compliant.  I think the patient is stable to be discharged.  I encouraged follow up with shelters and outpatient resources.    Linwood Dibbles, MD 03/12/15 2005

## 2015-03-12 NOTE — Discharge Instructions (Signed)
Go to any of the facilities that you're referred to for  psychiatric counseling. If you have any thought of harming yourself or someone else call 911 immediately

## 2015-03-12 NOTE — ED Notes (Signed)
Pt left for Dialysis

## 2015-03-12 NOTE — ED Provider Notes (Signed)
Patient went to hemodialysis. She will be discharged from hemodialysis. I did not evaluate her. She was deemed safe for discharge and outpatient counseling by psychiatry staff. Results for orders placed or performed during the hospital encounter of 03/11/15  CBC  Result Value Ref Range   WBC 8.0 4.0 - 10.5 K/uL   RBC 2.70 (L) 3.87 - 5.11 MIL/uL   Hemoglobin 9.4 (L) 12.0 - 15.0 g/dL   HCT 29.5 (L) 62.1 - 30.8 %   MCV 110.7 (H) 78.0 - 100.0 fL   MCH 34.8 (H) 26.0 - 34.0 pg   MCHC 31.4 30.0 - 36.0 g/dL   RDW 65.7 84.6 - 96.2 %   Platelets 217 150 - 400 K/uL  Basic metabolic panel  Result Value Ref Range   Sodium 138 135 - 145 mmol/L   Potassium 5.6 (H) 3.5 - 5.1 mmol/L   Chloride 97 (L) 101 - 111 mmol/L   CO2 25 22 - 32 mmol/L   Glucose, Bld 81 65 - 99 mg/dL   BUN 53 (H) 6 - 20 mg/dL   Creatinine, Ser 95.28 (H) 0.44 - 1.00 mg/dL   Calcium 8.6 (L) 8.9 - 10.3 mg/dL   GFR calc non Af Amer 4 (L) >60 mL/min   GFR calc Af Amer 4 (L) >60 mL/min   Anion gap 16 (H) 5 - 15  Ethanol  Result Value Ref Range   Alcohol, Ethyl (B) <5 <5 mg/dL  I-stat troponin, ED  Result Value Ref Range   Troponin i, poc 0.00 0.00 - 0.08 ng/mL   Comment 3           Dg Chest 2 View  03/11/2015   CLINICAL DATA:  Chest pain and shortness of breath. End-stage renal disease. HIV infection.  EXAM: CHEST  2 VIEW  COMPARISON:  Mar 08, 2015  FINDINGS: There is mild mid lung atelectatic change on the left. Lungs elsewhere clear. Heart size is upper normal with pulmonary vascular within normal limits. No adenopathy. There are sclerotic areas along the superior inferior endplates of most of the vertebral bodies in the thoracic spine.  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 Mild atelectasis left mid lung. No edema or consolidation.   Electronically Signed   By: Bretta Bang III M.D.   On: 03/11/2015 10:25   Dg Chest 2 View  03/08/2015    CLINICAL DATA:  Shortness of breath, leg swelling  EXAM: CHEST  2 VIEW  COMPARISON:  03/05/2015  FINDINGS: Lungs are clear. No frank interstitial edema. No pleural effusion or pneumothorax.  Mild cardiomegaly.  Visualized osseous structures are within normal limits.  IMPRESSION: No evidence of acute cardiopulmonary disease.   Electronically Signed   By: Charline Bills M.D.   On: 03/08/2015 21:12   Dg Chest 2 View  03/05/2015   CLINICAL DATA:  Shortness of Breath.  Renal failure.  EXAM: CHEST  2 VIEW  COMPARISON:  None.  FINDINGS: The lungs are clear. Heart is upper normal in size with pulmonary vascularity within normal limits. No adenopathy. There is atherosclerotic change in the aortic arch region. There is endplate sclerosis involving multiple vertebral bodies consistent with renal failure.  IMPRESSION: No edema or consolidation.   Electronically Signed   By: Bretta Bang III M.D.   On: 03/05/2015 08:41   Dg Finger Ring Right  03/05/2015   CLINICAL DATA:  Right ring finger injury.  Injured on 02/17/2015.  EXAM: RIGHT RING  FINGER 2+V  COMPARISON:  02/18/2015.  FINDINGS: Diffuse soft-tissue swelling . Comminuted displaced fracture of the distal aspect of the middle phalanx of the right fourth digit is present. Displaced fracture fragments are present. Lucency noted about the fracture. This may be related to bony resorption. Possibility of osteomyelitis cannot be completely excluded.  IMPRESSION: Comminuted displaced fracture of the distal aspect of the middle phalanx of the right fourth digit. Lucency noted about the fracture site may be related to bony resorption. Osteomyelitis cannot be excluded .   Electronically Signed   By: Maisie Fushomas  Register   On: 03/05/2015 08:43   Dg Finger Ring Right  02/18/2015   CLINICAL DATA:  Crush injury with wheelchair of the fourth digit  EXAM: RIGHT RING FINGER 2+V  COMPARISON:  None.  FINDINGS: There is a nondisplaced fracture through the distal aspect of the fourth  middle phalanx. Soft tissue swelling is noted.  IMPRESSION: Fracture through the distal aspect of the fourth middle phalanx.   Electronically Signed   By: Alcide CleverMark  Lukens M.D.   On: 02/18/2015 16:54     Doug SouSam Draiden Mirsky, MD 03/12/15 1402

## 2015-03-12 NOTE — ED Notes (Signed)
Security called to wand pt  

## 2015-03-12 NOTE — Progress Notes (Signed)
LCSW is aware of patient and discussed case with [facility]/TTS as well as Dr. Adele BarthelAronson/AD of CSW Patient is not currently being worked up for placement (housing placement). Patient is going to be reassessed, receive dialysis today and will most likely be discharged back to her living situation.  LCSW has exhausted housing options that the Clinical Social Work Department can offer at this time.  Deretha EmoryHannah Latonja Bobeck LCSW, MSW Clinical Social Work: Emergency Room [phone]

## 2015-03-12 NOTE — ED Notes (Signed)
Case Manager consulted. CM to review pt's chart.

## 2015-03-13 LAB — HEPATITIS B SURFACE ANTIGEN: Hepatitis B Surface Ag: NEGATIVE — AB

## 2015-04-07 ENCOUNTER — Non-Acute Institutional Stay (HOSPITAL_COMMUNITY)
Admission: EM | Admit: 2015-04-07 | Discharge: 2015-04-07 | Disposition: A | Discharge status: Home / Self Care | Payer: Medicare Other | Attending: Emergency Medicine | Admitting: Emergency Medicine

## 2015-04-07 ENCOUNTER — Encounter (HOSPITAL_COMMUNITY): Payer: Self-pay | Admitting: Family Medicine

## 2015-04-07 DIAGNOSIS — B20 Human immunodeficiency virus [HIV] disease: Secondary | ICD-10-CM | POA: Insufficient documentation

## 2015-04-07 DIAGNOSIS — Z992 Dependence on renal dialysis: Secondary | ICD-10-CM | POA: Insufficient documentation

## 2015-04-07 DIAGNOSIS — N186 End stage renal disease: Secondary | ICD-10-CM | POA: Diagnosis present

## 2015-04-07 DIAGNOSIS — F419 Anxiety disorder, unspecified: Secondary | ICD-10-CM | POA: Diagnosis not present

## 2015-04-07 DIAGNOSIS — Z888 Allergy status to other drugs, medicaments and biological substances status: Secondary | ICD-10-CM | POA: Diagnosis not present

## 2015-04-07 DIAGNOSIS — F17210 Nicotine dependence, cigarettes, uncomplicated: Secondary | ICD-10-CM | POA: Insufficient documentation

## 2015-04-07 DIAGNOSIS — Z79899 Other long term (current) drug therapy: Secondary | ICD-10-CM | POA: Insufficient documentation

## 2015-04-07 DIAGNOSIS — I120 Hypertensive chronic kidney disease with stage 5 chronic kidney disease or end stage renal disease: Secondary | ICD-10-CM | POA: Diagnosis present

## 2015-04-07 DIAGNOSIS — Z885 Allergy status to narcotic agent status: Secondary | ICD-10-CM | POA: Diagnosis not present

## 2015-04-07 LAB — RENAL FUNCTION PANEL
ALBUMIN: 3.7 g/dL (ref 3.5–5.0)
Anion gap: 15 (ref 5–15)
BUN: 47 mg/dL — ABNORMAL HIGH (ref 6–20)
CHLORIDE: 97 mmol/L — AB (ref 101–111)
CO2: 20 mmol/L — ABNORMAL LOW (ref 22–32)
Calcium: 7.7 mg/dL — ABNORMAL LOW (ref 8.9–10.3)
Creatinine, Ser: 8.76 mg/dL — ABNORMAL HIGH (ref 0.44–1.00)
GFR calc Af Amer: 5 mL/min — ABNORMAL LOW (ref >60)
GFR, EST NON AFRICAN AMERICAN: 4 mL/min — AB (ref >60)
Glucose, Bld: 103 mg/dL — ABNORMAL HIGH (ref 65–99)
Phosphorus: 3.2 mg/dL (ref 2.5–4.6)
Potassium: 4.7 mmol/L (ref 3.5–5.1)
SODIUM: 132 mmol/L — AB (ref 135–145)

## 2015-04-07 LAB — CBC
HEMATOCRIT: 32.8 % — AB (ref 36.0–46.0)
HEMOGLOBIN: 11.1 g/dL — AB (ref 12.0–15.0)
MCH: 35 pg — ABNORMAL HIGH (ref 26.0–34.0)
MCHC: 33.8 g/dL (ref 30.0–36.0)
MCV: 103.5 fL — ABNORMAL HIGH (ref 78.0–100.0)
Platelets: 279 10*3/uL (ref 150–400)
RBC: 3.17 MIL/uL — ABNORMAL LOW (ref 3.87–5.11)
RDW: 13.5 % (ref 11.5–15.5)
WBC: 6.8 10*3/uL (ref 4.0–10.5)

## 2015-04-07 MED ORDER — ALTEPLASE 2 MG IJ SOLR
2.0000 mg | Freq: Once | INTRAMUSCULAR | Status: AC | PRN
  Filled 2015-04-07: qty 2

## 2015-04-07 MED ORDER — PENTAFLUOROPROP-TETRAFLUOROETH EX AERO
1.0000 "application " | INHALATION_SPRAY | CUTANEOUS | Status: DC | PRN
  Filled 2015-04-07: qty 30

## 2015-04-07 MED ORDER — LIDOCAINE HCL (PF) 1 % IJ SOLN: 5.0000 mL | INTRAMUSCULAR | Status: DC | PRN

## 2015-04-07 MED ORDER — NEPRO/CARBSTEADY PO LIQD
237.0000 mL | ORAL | Status: DC | PRN
  Filled 2015-04-07: qty 237

## 2015-04-07 MED ORDER — LIDOCAINE-PRILOCAINE 2.5-2.5 % EX CREA
1.0000 "application " | TOPICAL_CREAM | CUTANEOUS | Status: DC | PRN
  Filled 2015-04-07: qty 5

## 2015-04-07 MED ORDER — SODIUM CHLORIDE 0.9 % IV SOLN: 100.0000 mL | INTRAVENOUS | Status: DC | PRN

## 2015-04-07 MED ORDER — HEPARIN SODIUM (PORCINE) 1000 UNIT/ML DIALYSIS
1000.0000 [IU] | INTRAMUSCULAR | Status: DC | PRN
  Filled 2015-04-07: qty 1

## 2015-04-07 NOTE — ED Notes (Signed)
Pt here for dialysis treatment. sts last one Monday. No other complaints.

## 2015-04-07 NOTE — ED Notes (Signed)
The pt comes here for dialysis they are not ready

## 2015-04-07 NOTE — Progress Notes (Signed)
Patient was seen on dialysis and the procedure was supervised.   BFR 400 Via left [place]  BP is 127/68.  AP -180  VP 160 UF 5 liters Bath 2/2.5  Patient appears to be tolerating treatment well with no complaints. She is anxious to go home as she needs to check in by 11pm and also will need a signature for a transport voucher.  Otherwise, doing well. She was [facility] because of psychiatric issues. Her last dialysis treatment was on Monday in [place].  Paulene FloorJames Breshae Belcher, MD 04/07/2015, 8:12 PM

## 2015-04-07 NOTE — ED Notes (Signed)
To dialysis  now 

## 2015-04-07 NOTE — ED Notes (Signed)
Pt not in the room
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
## 2015-04-07 NOTE — ED Notes (Signed)
Pt here for dialysis.  They are not ready for her  Leg pain

## 2015-04-07 NOTE — ED Notes (Signed)
Dialysis will not be ready for this pt until the on-call rn arrived.  They will call when they are ready.  Pt advised

## 2015-04-09 ENCOUNTER — Encounter (HOSPITAL_COMMUNITY): Payer: Self-pay

## 2015-04-09 ENCOUNTER — Emergency Department (HOSPITAL_COMMUNITY): Payer: Medicare Other

## 2015-04-09 ENCOUNTER — Emergency Department (HOSPITAL_COMMUNITY)
Admission: EM | Admit: 2015-04-09 | Discharge: 2015-04-09 | Disposition: A | Discharge status: Home / Self Care | Payer: Medicare Other | Attending: Emergency Medicine | Admitting: Emergency Medicine

## 2015-04-09 DIAGNOSIS — Z91199 Patient's noncompliance with other medical treatment and regimen due to unspecified reason: Secondary | ICD-10-CM

## 2015-04-09 DIAGNOSIS — Z79899 Other long term (current) drug therapy: Secondary | ICD-10-CM | POA: Diagnosis not present

## 2015-04-09 DIAGNOSIS — R0602 Shortness of breath: Secondary | ICD-10-CM | POA: Diagnosis present

## 2015-04-09 DIAGNOSIS — N186 End stage renal disease: Secondary | ICD-10-CM | POA: Diagnosis not present

## 2015-04-09 DIAGNOSIS — Z720 Tobacco use: Secondary | ICD-10-CM | POA: Insufficient documentation

## 2015-04-09 DIAGNOSIS — F419 Anxiety disorder, unspecified: Secondary | ICD-10-CM | POA: Insufficient documentation

## 2015-04-09 DIAGNOSIS — Z21 Asymptomatic human immunodeficiency virus [HIV] infection status: Secondary | ICD-10-CM | POA: Diagnosis not present

## 2015-04-09 DIAGNOSIS — I120 Hypertensive chronic kidney disease with stage 5 chronic kidney disease or end stage renal disease: Secondary | ICD-10-CM | POA: Diagnosis not present

## 2015-04-09 DIAGNOSIS — Z9119 Patient's noncompliance with other medical treatment and regimen: Secondary | ICD-10-CM | POA: Insufficient documentation

## 2015-04-09 LAB — BASIC METABOLIC PANEL
Anion gap: 15 (ref 5–15)
BUN: 64 mg/dL — ABNORMAL HIGH (ref 6–20)
CALCIUM: 7.8 mg/dL — AB (ref 8.9–10.3)
CHLORIDE: 102 mmol/L (ref 101–111)
CO2: 22 mmol/L (ref 22–32)
CREATININE: 10.83 mg/dL — AB (ref 0.44–1.00)
GFR calc Af Amer: 4 mL/min — ABNORMAL LOW (ref >60)
GFR calc non Af Amer: 3 mL/min — ABNORMAL LOW (ref >60)
GLUCOSE: 119 mg/dL — AB (ref 65–99)
Potassium: 4.9 mmol/L (ref 3.5–5.1)
Sodium: 139 mmol/L (ref 135–145)

## 2015-04-09 LAB — CBC
HCT: 30.2 % — ABNORMAL LOW (ref 36.0–46.0)
HEMOGLOBIN: 9.8 g/dL — AB (ref 12.0–15.0)
MCH: 34.9 pg — ABNORMAL HIGH (ref 26.0–34.0)
MCHC: 32.5 g/dL (ref 30.0–36.0)
MCV: 107.5 fL — ABNORMAL HIGH (ref 78.0–100.0)
PLATELETS: 264 10*3/uL (ref 150–400)
RBC: 2.81 MIL/uL — ABNORMAL LOW (ref 3.87–5.11)
RDW: 13.8 % (ref 11.5–15.5)
WBC: 7 10*3/uL (ref 4.0–10.5)

## 2015-04-09 LAB — HEPATITIS B SURFACE ANTIGEN: Hepatitis B Surface Ag: NEGATIVE

## 2015-04-09 LAB — MAGNESIUM: Magnesium: 2 mg/dL (ref 1.7–2.4)

## 2015-04-09 LAB — PHOSPHORUS: Phosphorus: 4.2 mg/dL (ref 2.5–4.6)

## 2015-04-09 NOTE — Discharge Instructions (Signed)
End-Stage Kidney Disease °The kidneys are two organs that lie on either side of the spine between the middle of the back and the front of the abdomen. The kidneys:  °· Remove wastes and extra water from the blood.   °· Produce important hormones. These help keep bones strong, regulate blood pressure, and help create red blood cells.   °· Balance the fluids and chemicals in the blood and tissues. °End-stage kidney disease occurs when the kidneys are so damaged that they cannot do their job. When the kidneys cannot do their job, life-threatening problems occur. The body cannot stay clean and strong without the help of the kidneys. In end-stage kidney disease, the kidneys cannot get better. You need a new kidney or treatments to do some of the work healthy kidneys do in order to stay alive. °CAUSES  °End-stage kidney disease usually occurs when a long-lasting (chronic) kidney disease gets worse. It may also occur after the kidneys are suddenly damaged (acute kidney injury).  °SYMPTOMS  °· Swelling (edema) of the legs, ankles, or feet.   °· Tiredness (lethargy).   °· Nausea or vomiting.   °· Confusion.   °· Problems with urination, such as:   °¨ Decreased urine production.   °¨ Frequent urination, especially at night.   °¨ Frequent accidents in children who are potty trained.   °· Muscle twitches and cramps.   °· Persistent itchiness.   °· Loss of appetite.   °· Headaches.   °· Abnormally dark or light skin.   °· Numbness in the hands or feet.   °· Easy bruising.   °· Frequent hiccups.   °· Menstruation stops. °DIAGNOSIS  °Your health care provider will measure your blood pressure and take some tests. These may include:  °· Urine tests.   °· Blood tests.   °· Imaging tests, such as:   °¨ An ultrasound exam.   °¨ Computed tomography (CT). °· A kidney biopsy. °TREATMENT  °There are two treatments for end-stage kidney disease:  °· A procedure that removes toxic wastes from the body (dialysis).   °· Receiving a new kidney  (kidney transplant). °Both of these treatments have serious risks and consequences. Your health care provider will help you determine which treatment is best for you based on your health, age, and other factors. In addition to having dialysis or a kidney transplant, you may need to take medicines to control high blood pressure (hypertension) and cholesterol and to decrease phosphorus levels in your blood.  °HOME CARE INSTRUCTIONS °· Follow your prescribed diet.   °· Take medicines only as directed by your health care provider.   °· Do not take any new medicines (prescription, over-the-counter, or nutritional supplements) unless approved by your health care provider. Many medicines can worsen your kidney damage or need to have the dose adjusted.   °· Keep all follow-up visits as directed by your health care provider. °MAKE SURE YOU: °· Understand these instructions. °· Will watch your condition. °· Will get help right away if you are not doing well or get worse. °Document Released: 12/16/2003 Document Revised: 02/09/2014 Document Reviewed: 05/24/2012 °Exit[employer]® Patient Information ©2015 [employer]. This information is not intended to replace advice given to you by your health care provider. Make sure you discuss any questions you have with your health care provider. ° °

## 2015-04-09 NOTE — ED Notes (Signed)
Pt from home via [facility] with c/o "getting too hot" which causes SOB this afternoon.  Pt reports missing dialysis this morning for a drug and mental rehab class.  Pt associates missing dialysis as the cause of her SOB.  Pt A&O, denies any other complaints, NAD.

## 2015-04-09 NOTE — ED Notes (Signed)
Pt left prior to signing paperwork.
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
## 2015-04-10 ENCOUNTER — Encounter (HOSPITAL_COMMUNITY): Payer: Self-pay | Admitting: Family Medicine

## 2015-04-10 ENCOUNTER — Emergency Department (HOSPITAL_COMMUNITY)
Admission: EM | Admit: 2015-04-10 | Discharge: 2015-04-10 | Disposition: A | Discharge status: Home / Self Care | Payer: Medicare Other | Attending: Emergency Medicine | Admitting: Emergency Medicine

## 2015-04-10 ENCOUNTER — Emergency Department (HOSPITAL_COMMUNITY): Payer: Medicare Other

## 2015-04-10 DIAGNOSIS — Z7951 Long term (current) use of inhaled steroids: Secondary | ICD-10-CM | POA: Insufficient documentation

## 2015-04-10 DIAGNOSIS — K219 Gastro-esophageal reflux disease without esophagitis: Secondary | ICD-10-CM | POA: Diagnosis not present

## 2015-04-10 DIAGNOSIS — D649 Anemia, unspecified: Secondary | ICD-10-CM | POA: Diagnosis not present

## 2015-04-10 DIAGNOSIS — R2233 Localized swelling, mass and lump, upper limb, bilateral: Secondary | ICD-10-CM | POA: Diagnosis not present

## 2015-04-10 DIAGNOSIS — Z720 Tobacco use: Secondary | ICD-10-CM | POA: Insufficient documentation

## 2015-04-10 DIAGNOSIS — Z590 Homelessness: Secondary | ICD-10-CM | POA: Diagnosis not present

## 2015-04-10 DIAGNOSIS — Z992 Dependence on renal dialysis: Secondary | ICD-10-CM | POA: Diagnosis not present

## 2015-04-10 DIAGNOSIS — Z21 Asymptomatic human immunodeficiency virus [HIV] infection status: Secondary | ICD-10-CM | POA: Diagnosis not present

## 2015-04-10 DIAGNOSIS — F329 Major depressive disorder, single episode, unspecified: Secondary | ICD-10-CM | POA: Insufficient documentation

## 2015-04-10 DIAGNOSIS — Z79899 Other long term (current) drug therapy: Secondary | ICD-10-CM | POA: Diagnosis not present

## 2015-04-10 DIAGNOSIS — Z8701 Personal history of pneumonia (recurrent): Secondary | ICD-10-CM | POA: Insufficient documentation

## 2015-04-10 DIAGNOSIS — Z9889 Other specified postprocedural states: Secondary | ICD-10-CM | POA: Diagnosis not present

## 2015-04-10 DIAGNOSIS — I509 Heart failure, unspecified: Secondary | ICD-10-CM | POA: Insufficient documentation

## 2015-04-10 DIAGNOSIS — F419 Anxiety disorder, unspecified: Secondary | ICD-10-CM | POA: Insufficient documentation

## 2015-04-10 DIAGNOSIS — M1990 Unspecified osteoarthritis, unspecified site: Secondary | ICD-10-CM | POA: Diagnosis not present

## 2015-04-10 DIAGNOSIS — I120 Hypertensive chronic kidney disease with stage 5 chronic kidney disease or end stage renal disease: Secondary | ICD-10-CM | POA: Insufficient documentation

## 2015-04-10 DIAGNOSIS — N186 End stage renal disease: Secondary | ICD-10-CM | POA: Insufficient documentation

## 2015-04-10 DIAGNOSIS — R0602 Shortness of breath: Secondary | ICD-10-CM | POA: Diagnosis present

## 2015-04-10 LAB — BASIC METABOLIC PANEL
Anion gap: 16 — ABNORMAL HIGH (ref 5–15)
BUN: 72 mg/dL — ABNORMAL HIGH (ref 6–20)
CHLORIDE: 98 mmol/L — AB (ref 101–111)
CO2: 21 mmol/L — AB (ref 22–32)
Calcium: 7.6 mg/dL — ABNORMAL LOW (ref 8.9–10.3)
Creatinine, Ser: 11.97 mg/dL — ABNORMAL HIGH (ref 0.44–1.00)
GFR, EST AFRICAN AMERICAN: 3 mL/min — AB (ref >60)
GFR, EST NON AFRICAN AMERICAN: 3 mL/min — AB (ref >60)
GLUCOSE: 82 mg/dL (ref 65–99)
Potassium: 5.8 mmol/L — ABNORMAL HIGH (ref 3.5–5.1)
SODIUM: 135 mmol/L (ref 135–145)

## 2015-04-10 LAB — CBC
HCT: 32.3 % — ABNORMAL LOW (ref 36.0–46.0)
Hemoglobin: 10.3 g/dL — ABNORMAL LOW (ref 12.0–15.0)
MCH: 33.8 pg (ref 26.0–34.0)
MCHC: 31.9 g/dL (ref 30.0–36.0)
MCV: 105.9 fL — AB (ref 78.0–100.0)
PLATELETS: 260 10*3/uL (ref 150–400)
RBC: 3.05 MIL/uL — ABNORMAL LOW (ref 3.87–5.11)
RDW: 13.6 % (ref 11.5–15.5)
WBC: 7.6 10*3/uL (ref 4.0–10.5)

## 2015-04-10 NOTE — ED Notes (Signed)
Patient transported to X-ray 

## 2015-04-10 NOTE — ED Provider Notes (Signed)
CSN: [id number]     Arrival date & time 04/10/15  1003 History   First MD Initiated Contact with Patient 04/10/15 1003     Chief Complaint  Patient presents with  . needs dialysis   . Shortness of Breath     (Consider location/radiation/quality/duration/timing/severity/associated sxs/prior Treatment) HPI Comments: Staying here in an extended stay hotel while she's getting drug treatment. Doe MWF dialysis, was unable to get dialysis yesterday as they didn't have a slot for her. Doesn't have a regular dialysis provider since she's here for drug treatment, normally does dialysis at [facility].  Patient states some shortness of breath and some arm swelling.  Patient is a 64 y.o. female presenting with shortness of breath. The history is provided by the patient.  Shortness of Breath Severity:  Mild Onset quality:  Gradual Timing:  Constant Progression:  Unchanged Chronicity:  Recurrent Context comment:  Miised dialysis yesterday Relieved by:  Nothing Worsened by:  Nothing tried Associated symptoms: no abdominal pain, no chest pain, no cough, no fever and no vomiting     Past Medical History  Diagnosis Date  . Hypertension   . HIV infection   . Morbid obesity   . ESRD (end stage renal disease) on dialysis 09/30/2013    Started dialysis in [place], [place] around 2009.  ESRD due to HTN vs drug abuse according to pt.  Was on dialysis at [facility] until Feb 2015 when she was admitted to a SNF due to homelessness and drug abuse.  Then changed to [facility] on TTS schedule.     . Anginal pain   . CHF (congestive heart failure)   . Shortness of breath   . Pneumonia   . Depression   . Anxiety   . GERD (gastroesophageal reflux disease)   . Arthritis   . Anemia   . Hyperkalemia 08/11/2014  . Homeless    Past Surgical History  Procedure Laterality Date  . Arteriovenous graft placement      left forearm  . Cardiac catheterization    . Laparotomy      states seh was cut open because  seh was bleeding on the inside   Family History  Problem Relation Age of Onset  . Kidney failure Other     niece  . High blood pressure    . Lung cancer    . Breast cancer Neg Hx   . Colon cancer Neg Hx   . Stroke Mother   . HIV/AIDS Brother     died of AIDS   History  Substance Use Topics  . Smoking status: Current Every Day Smoker -- 0.50 packs/day    Types: Cigarettes    Start date: 05/06/2014  . Smokeless tobacco: Never Used     Comment: recently quit  . Alcohol Use: No   OB History    No data available     Review of Systems  Constitutional: Negative for fever.  Respiratory: Positive for shortness of breath. Negative for cough.   Cardiovascular: Negative for chest pain and leg swelling.  Gastrointestinal: Negative for vomiting and abdominal pain.  All other systems reviewed and are negative.     Allergies  Trazodone and nefazodone and Morphine and related  Home Medications   Prior to Admission medications   Medication Sig Start Date End Date Taking? Authorizing Provider  albuterol (PROVENTIL HFA;VENTOLIN HFA) 108 (90 BASE) MCG/ACT inhaler Inhale 2 puffs into the lungs every 6 (six) hours as needed for wheezing or shortness  of breath.    Historical Provider, MD  amLODipine (NORVASC) 5 MG tablet Take 10 mg by mouth at bedtime.     Historical Provider, MD  calcitRIOL (ROCALTROL) 0.5 MCG capsule Take 2 mcg by mouth daily. Takes on Mon, Wed, and Fri with Dialysis    Historical Provider, MD  cinacalcet (SENSIPAR) 90 MG tablet Take 90 mg by mouth daily.    Historical Provider, MD  colchicine 0.6 MG tablet Take 0.6 mg by mouth daily.    Historical Provider, MD  diphenhydrAMINE (BENADRYL) 25 mg capsule Take 50 mg by mouth at bedtime as needed for sleep.    Historical Provider, MD  FLUoxetine (PROZAC) 20 MG capsule Take 20 mg by mouth daily.    Historical Provider, MD  folic acid-vitamin b complex-vitamin c-selenium-zinc (DIALYVITE) 3 MG TABS tablet Take 1 tablet by mouth  daily.    Historical Provider, MD  hydrALAZINE (APRESOLINE) 25 MG tablet Take 25 mg by mouth every 8 (eight) hours.    Historical Provider, MD  isosorbide mononitrate (IMDUR) 30 MG 24 hr tablet Take 30 mg by mouth daily.    Historical Provider, MD  lamivudine (EPIVIR-HBV) 5 MG/ML solution Take 5 mLs (25 mg total) by mouth daily. 07/14/14   Gardiner Barefootobert W Comer, MD  LORazepam (ATIVAN) 0.5 MG tablet Take 0.5 mg by mouth every 12 (twelve) hours as needed for anxiety.    Historical Provider, MD  omeprazole (PRILOSEC) 20 MG capsule Take 20 mg by mouth daily.    Historical Provider, MD  oxyCODONE-acetaminophen (PERCOCET/ROXICET) 5-325 MG per tablet Take 1-2 tablets by mouth every 6 (six) hours as needed for severe pain. 02/24/15   Gwyneth SproutWhitney Plunkett, MD  polyethylene glycol (MIRALAX / GLYCOLAX) packet Take 17 g by mouth daily as needed for mild constipation.    Historical Provider, MD  raltegravir (ISENTRESS) 400 MG tablet Take 1 tablet (400 mg total) by mouth 2 (two) times daily. 07/14/14   Gardiner Barefootobert W Comer, MD  sevelamer carbonate (RENVELA) 800 MG tablet Take 2,400 mg by mouth 3 (three) times daily with meals.    Historical Provider, MD  sevelamer carbonate (RENVELA) 800 MG tablet Take 3 tablets (2,400 mg total) by mouth 3 (three) times daily with meals. 08/14/14   Erasmo DownerAngela M Bacigalupo, MD  tenofovir (VIREAD) 300 MG tablet Take 1 tablet (300 mg total) by mouth once a week. Give on Friday 07/14/14   Gardiner Barefootobert W Comer, MD  tiotropium (SPIRIVA) 18 MCG inhalation capsule Place 18 mcg into inhaler and inhale daily.    Historical Provider, MD   BP 171/88 mmHg  Pulse 96  Temp(Src) 97.3 F (36.3 C) (Oral)  Resp 19  SpO2 99% Physical Exam  Constitutional: She is oriented to person, place, and time. She appears well-developed and well-nourished. No distress.  HENT:  Head: Normocephalic and atraumatic.  Mouth/Throat: Oropharynx is clear and moist.  Eyes: EOM are normal. Pupils are equal, round, and reactive to light.  Neck:  Normal range of motion. Neck supple.  Cardiovascular: Normal rate and regular rhythm.  Exam reveals no friction rub.   No murmur heard. Pulmonary/Chest: Effort normal and breath sounds normal. No respiratory distress. She has no wheezes. She has no rales.  Abdominal: Soft. She exhibits no distension. There is no tenderness. There is no rebound.  Musculoskeletal: Normal range of motion. She exhibits edema (non pitting in arms and legs).  Neurological: She is alert and oriented to person, place, and time. No cranial nerve deficit. She exhibits normal muscle tone.  Coordination normal.  Skin: No rash noted. She is not diaphoretic.  Nursing note and vitals reviewed.   ED Course  Procedures (including critical care time) Labs Review Labs Reviewed  CBC  BASIC METABOLIC PANEL    Imaging Review Dg Chest 2 View  04/10/2015   CLINICAL DATA:  Shortness breath, cough x1 day.  Missed dialysis.  EXAM: CHEST - 2 VIEW  COMPARISON:  03/11/2015  FINDINGS: Lungs are clear. Heart size and mediastinal contours are within normal limits. Tortuous atheromatous aorta. No pneumothorax. No effusion. Stable Sclerotic changes in thoracolumbar spine probably related to chronic renal disease.  IMPRESSION: No acute cardiopulmonary disease.   Electronically Signed   By: Corlis Leak M.D.   On: 04/10/2015 11:25     EKG Interpretation   Date/Time:  Saturday April 10 2015 10:14:28 EDT Ventricular Rate:  96 PR Interval:  195 QRS Duration: 106 QT Interval:  388 QTC Calculation: 490 R Axis:   51 Text Interpretation:  Sinus rhythm Minimal ST depression, lateral leads  Borderline prolonged QT interval No significant change since last tracing  Confirmed by Gwendolyn Grant  MD, Raiya Stainback ([id number]) on 04/10/2015 10:20:40 AM      MDM   Final diagnoses:  Shortness of breath    64 year old female here with some shortness of breath and swelling in her arms. States she's missed dialysis. She missed a yesterday. Last also session was 3 days  ago. She is well-appearing, does not appear in any overt respiratory distress. We'll check labs and imaging. Labs show K of 5.8. CXR ok. Dr. Arlean Hopping with Renal will dialyze.   Elwin Mocha, MD 04/10/15 [phone]

## 2015-04-10 NOTE — Progress Notes (Signed)
Pt in HD from ED. Will be discharged to home after HD tx.

## 2015-04-10 NOTE — ED Notes (Signed)
Pt here for SOB and needing dialysis. sts missed yesterday dialysis.

## 2015-04-10 NOTE — Procedures (Signed)
[name] returns, asking for dialysis. Last got HD in [place] on Monday and Wed.  No SOB, CP. Exam unremarkable, no edema. K 5.8 and Hb 10.3.  Plan HD today. No need for esa. Looked back and has not rec'd any ESA here in the last 6 mos. She has been getting most of her HD in [place], however.   I was present at this dialysis session, have reviewed the session itself and made  appropriate changes Vinson Moselleob Milanya Sunderland MD (pgr) [phone]    [phone]) [phone] 03/27/2015, 10:31 AM

## 2015-04-10 NOTE — ED Notes (Signed)
Pt given meal and cup of ice

## 2015-04-10 NOTE — Progress Notes (Signed)
Pt HD treatment completed. Alert, bp high. Discharge to home. Transportation called.

## 2015-04-10 NOTE — Progress Notes (Signed)
Pt being difficult during HD today. Yells out for the staff, restless and repeatedly taking EKG leads off. Discussed compliance with her.

## 2015-04-11 LAB — HEPATITIS B SURFACE ANTIGEN: HEP B S AG: NEGATIVE

## 2015-04-12 ENCOUNTER — Encounter (HOSPITAL_COMMUNITY): Payer: Self-pay | Admitting: *Deleted

## 2015-04-12 ENCOUNTER — Emergency Department (HOSPITAL_COMMUNITY)
Admission: EM | Admit: 2015-04-12 | Discharge: 2015-04-12 | Disposition: A | Discharge status: Home / Self Care | Payer: Medicare Other | Attending: Emergency Medicine | Admitting: Emergency Medicine

## 2015-04-12 DIAGNOSIS — Z79899 Other long term (current) drug therapy: Secondary | ICD-10-CM | POA: Insufficient documentation

## 2015-04-12 DIAGNOSIS — Z21 Asymptomatic human immunodeficiency virus [HIV] infection status: Secondary | ICD-10-CM | POA: Insufficient documentation

## 2015-04-12 DIAGNOSIS — F419 Anxiety disorder, unspecified: Secondary | ICD-10-CM | POA: Diagnosis not present

## 2015-04-12 DIAGNOSIS — N186 End stage renal disease: Secondary | ICD-10-CM | POA: Diagnosis not present

## 2015-04-12 DIAGNOSIS — I120 Hypertensive chronic kidney disease with stage 5 chronic kidney disease or end stage renal disease: Secondary | ICD-10-CM | POA: Diagnosis not present

## 2015-04-12 DIAGNOSIS — Z720 Tobacco use: Secondary | ICD-10-CM | POA: Insufficient documentation

## 2015-04-12 DIAGNOSIS — Z992 Dependence on renal dialysis: Secondary | ICD-10-CM | POA: Insufficient documentation

## 2015-04-12 DIAGNOSIS — Z4931 Encounter for adequacy testing for hemodialysis: Secondary | ICD-10-CM | POA: Diagnosis present

## 2015-04-12 LAB — CBC WITH DIFFERENTIAL/PLATELET
Basophils Absolute: 0 10*3/uL (ref 0.0–0.1)
Basophils Relative: 0 % (ref 0–1)
EOS ABS: 0.1 10*3/uL (ref 0.0–0.7)
Eosinophils Relative: 1 % (ref 0–5)
HCT: 33.2 % — ABNORMAL LOW (ref 36.0–46.0)
Hemoglobin: 10.8 g/dL — ABNORMAL LOW (ref 12.0–15.0)
Lymphocytes Relative: 34 % (ref 12–46)
Lymphs Abs: 2.4 10*3/uL (ref 0.7–4.0)
MCH: 34.3 pg — AB (ref 26.0–34.0)
MCHC: 32.5 g/dL (ref 30.0–36.0)
MCV: 105.4 fL — ABNORMAL HIGH (ref 78.0–100.0)
MONOS PCT: 9 % (ref 3–12)
Monocytes Absolute: 0.6 10*3/uL (ref 0.1–1.0)
Neutro Abs: 3.9 10*3/uL (ref 1.7–7.7)
Neutrophils Relative %: 56 % (ref 43–77)
PLATELETS: 256 10*3/uL (ref 150–400)
RBC: 3.15 MIL/uL — AB (ref 3.87–5.11)
RDW: 13.6 % (ref 11.5–15.5)
WBC: 6.9 10*3/uL (ref 4.0–10.5)

## 2015-04-12 LAB — BASIC METABOLIC PANEL
ANION GAP: 18 — AB (ref 5–15)
BUN: 46 mg/dL — AB (ref 6–20)
CO2: 23 mmol/L (ref 22–32)
CREATININE: 9.86 mg/dL — AB (ref 0.44–1.00)
Calcium: 7.8 mg/dL — ABNORMAL LOW (ref 8.9–10.3)
Chloride: 96 mmol/L — ABNORMAL LOW (ref 101–111)
GFR calc non Af Amer: 4 mL/min — ABNORMAL LOW (ref >60)
GFR, EST AFRICAN AMERICAN: 4 mL/min — AB (ref >60)
GLUCOSE: 84 mg/dL (ref 65–99)
Potassium: 4.4 mmol/L (ref 3.5–5.1)
Sodium: 137 mmol/L (ref 135–145)

## 2015-04-12 LAB — CBG MONITORING, ED: GLUCOSE-CAPILLARY: 86 mg/dL (ref 65–99)

## 2015-04-12 NOTE — ED Notes (Signed)
Pt states she has no medical complaints and just needs dialysis.

## 2015-04-12 NOTE — ED Notes (Signed)
Food delivered at bedside.

## 2015-04-12 NOTE — ED Notes (Signed)
Pt given turkey sandwich and coffee

## 2015-04-12 NOTE — Procedures (Signed)
I have personally attended this patient's dialysis session.   K 4.3 2K bath L AVF 400 UF goal 4 liters Pre weight 114.3 kg "reported" as she is unwilling to stand and weigh (Only post HD weight we have is from 5/11 and was 110.3) Goal is 4 liters  Jean Balynthia Cobie Leidner, MD [facility] [phone] Pager 04/12/2015, 1:40 PM

## 2015-04-12 NOTE — ED Notes (Signed)
Pt requesting another coffee.  Coffee given to pt.
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
## 2015-04-12 NOTE — ED Notes (Signed)
Pt requested another meal, meal ordered.

## 2015-04-12 NOTE — ED Notes (Signed)
Pt requesting assistance getting comfortable in bed, requested RN to call dialysis to get exact time.  RN told pt that dialysis consult is in and dialysis states they will get to her asap.  Pt seems frustrated that she can't go immediately and called dialysis herself.

## 2015-04-12 NOTE — Progress Notes (Signed)
Pt multiple times has removed bp cuff, EKG lines and pulled at dialysis lines/needle sites. Repeatedly asked her to stop.

## 2015-04-12 NOTE — ED Notes (Signed)
Pt denies medical complaints and she called the ambulance to bring her to the hospital because she needs dialysis.  Pt has no pain.  Pt requesting food and coffee.

## 2015-04-12 NOTE — Progress Notes (Signed)
Pt left HD unit in wheelchair refusing post weight or assistance. Very angry. MD present and aware.

## 2015-04-12 NOTE — ED Notes (Signed)
Pt continues to press the call button because her food is taking too long.  RN told pt that the meal has been ordered and will be up as soon as it can.  Pt was given a [employer] sandwich bag earlier.

## 2015-04-12 NOTE — Progress Notes (Signed)
Pt HD tx started at 1328. No complications. Pt alert, bp high. Will be discharged after HD tx.

## 2015-04-12 NOTE — ED Notes (Signed)
Pt refuses to keep her blood pressure cuff on her arm.

## 2015-04-12 NOTE — Progress Notes (Signed)
Pt signing off treatment 2 hours early. Refusing to sign AMA paper. MD aware. Pt argumentative. Being discharged to home. Advised her to be careful with fluids.

## 2015-04-14 ENCOUNTER — Emergency Department (HOSPITAL_COMMUNITY)
Admission: EM | Admit: 2015-04-14 | Discharge: 2015-04-14 | Discharge status: Against Medical Advice | Payer: Medicare Other | Attending: Emergency Medicine | Admitting: Emergency Medicine

## 2015-04-14 ENCOUNTER — Encounter (HOSPITAL_COMMUNITY): Payer: Self-pay | Admitting: Emergency Medicine

## 2015-04-14 DIAGNOSIS — Z21 Asymptomatic human immunodeficiency virus [HIV] infection status: Secondary | ICD-10-CM | POA: Insufficient documentation

## 2015-04-14 DIAGNOSIS — Z79899 Other long term (current) drug therapy: Secondary | ICD-10-CM | POA: Insufficient documentation

## 2015-04-14 DIAGNOSIS — Z720 Tobacco use: Secondary | ICD-10-CM | POA: Insufficient documentation

## 2015-04-14 DIAGNOSIS — I120 Hypertensive chronic kidney disease with stage 5 chronic kidney disease or end stage renal disease: Secondary | ICD-10-CM | POA: Insufficient documentation

## 2015-04-14 DIAGNOSIS — Z7951 Long term (current) use of inhaled steroids: Secondary | ICD-10-CM | POA: Diagnosis not present

## 2015-04-14 DIAGNOSIS — Z992 Dependence on renal dialysis: Secondary | ICD-10-CM | POA: Diagnosis not present

## 2015-04-14 DIAGNOSIS — F419 Anxiety disorder, unspecified: Secondary | ICD-10-CM | POA: Diagnosis not present

## 2015-04-14 DIAGNOSIS — N186 End stage renal disease: Secondary | ICD-10-CM | POA: Diagnosis present

## 2015-04-14 DIAGNOSIS — I10 Essential (primary) hypertension: Secondary | ICD-10-CM

## 2015-04-14 LAB — BASIC METABOLIC PANEL
ANION GAP: 18 — AB (ref 5–15)
BUN: 59 mg/dL — ABNORMAL HIGH (ref 6–20)
CO2: 23 mmol/L (ref 22–32)
Calcium: 8.1 mg/dL — ABNORMAL LOW (ref 8.9–10.3)
Chloride: 97 mmol/L — ABNORMAL LOW (ref 101–111)
Creatinine, Ser: 10.77 mg/dL — ABNORMAL HIGH (ref 0.44–1.00)
GFR calc Af Amer: 4 mL/min — ABNORMAL LOW (ref >60)
GFR calc non Af Amer: 3 mL/min — ABNORMAL LOW (ref >60)
GLUCOSE: 121 mg/dL — AB (ref 65–99)
POTASSIUM: 4.5 mmol/L (ref 3.5–5.1)
SODIUM: 138 mmol/L (ref 135–145)

## 2015-04-14 LAB — CBC
HCT: 30.7 % — ABNORMAL LOW (ref 36.0–46.0)
Hemoglobin: 9.9 g/dL — ABNORMAL LOW (ref 12.0–15.0)
MCH: 34.7 pg — ABNORMAL HIGH (ref 26.0–34.0)
MCHC: 32.2 g/dL (ref 30.0–36.0)
MCV: 107.7 fL — ABNORMAL HIGH (ref 78.0–100.0)
Platelets: 225 10*3/uL (ref 150–400)
RBC: 2.85 MIL/uL — ABNORMAL LOW (ref 3.87–5.11)
RDW: 13.5 % (ref 11.5–15.5)
WBC: 9 10*3/uL (ref 4.0–10.5)

## 2015-04-14 MED ORDER — AMLODIPINE BESYLATE 5 MG PO TABS
10.0000 mg | ORAL_TABLET | Freq: Once | ORAL | Status: AC
  Administered 2015-04-14: 10 mg via ORAL
  Filled 2015-04-14: qty 2

## 2015-04-14 NOTE — ED Notes (Signed)
No call for reassessment and Vital signs.

## 2015-04-14 NOTE — ED Notes (Signed)
Pt left AMA, pt yelling at staff, pt upset because she wanted to go straight to dialysis after walking into the door, pt states that she should not have to wait in the waiting room or the room or see any doctors before receiving dialysis. Pt hit call bell multiple times yelling and cussing at staff, stating she wants to go to dialysis. Pt states she she did not want her discharge paperwork and states she will not return to this stupid hospital at 8am for dialysis, pt refused vitals, and refused to sign.
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
## 2015-04-14 NOTE — ED Notes (Signed)
Pt states that she needs dialysis, states she comes here on MWF to receive dialysis, refuses to be hooked up on monitor because she states nothing is wrong with her. Pt states she either comes here or [place] to receive dialysis because no dialysis centers will take her because of her drug problems and SI behaviors, denies SI/HI. Pt states she is staying at a rehab center.

## 2015-04-14 NOTE — Discharge Instructions (Signed)
It was our pleasure to provide your ER care today - we hope that you feel better.  We discussed your case with our kidney doctors - they request that you return here tomorrow morning for dialysis - return to ER at 8 AM.  Also, follow up with primary care doctor in the next couple days - see referral - have them recheck your blood pressure then.   Return to ER right away if worse, new symptoms, fevers, trouble breathing, medical emergency, other concern.     End-Stage Kidney Disease The kidneys are two organs that lie on either side of the spine between the middle of the back and the front of the abdomen. The kidneys:   Remove wastes and extra water from the blood.   Produce important hormones. These help keep bones strong, regulate blood pressure, and help create red blood cells.   Balance the fluids and chemicals in the blood and tissues. End-stage kidney disease occurs when the kidneys are so damaged that they cannot do their job. When the kidneys cannot do their job, life-threatening problems occur. The body cannot stay clean and strong without the help of the kidneys. In end-stage kidney disease, the kidneys cannot get better.You need a new kidney or treatments to do some of the work healthy kidneys do in order to stay alive. CAUSES  End-stage kidney disease usually occurs when a long-lasting (chronic) kidney disease gets worse. It may also occur after the kidneys are suddenly damaged (acute kidney injury).  SYMPTOMS   Swelling (edema) of the legs, ankles, or feet.   Tiredness (lethargy).   Nausea or vomiting.   Confusion.   Problems with urination, such as:   Decreased urine production.   Frequent urination, especially at night.   Frequent accidents in children who are potty trained.   Muscle twitches and cramps.   Persistent itchiness.   Loss of appetite.   Headaches.   Abnormally dark or light skin.   Numbness in the hands or feet.   Easy  bruising.   Frequent hiccups.   Menstruation stops. DIAGNOSIS  Your health care provider will measure your blood pressure and take some tests. These may include:   Urine tests.   Blood tests.   Imaging tests, such as:   An ultrasound exam.   Computed tomography (CT).  A kidney biopsy. TREATMENT  There are two treatments for end-stage kidney disease:   A procedure that removes toxic wastes from the body (dialysis).   Receiving a new kidney (kidney transplant). Both of these treatments have serious risks and consequences. Your health care provider will help you determine which treatment is best for you based on your health, age, and other factors. In addition to having dialysis or a kidney transplant, you may need to take medicines to control high blood pressure (hypertension) and cholesterol and to decrease phosphorus levels in your blood.  HOME CARE INSTRUCTIONS  Follow your prescribed diet.   Take medicines only as directed by your health care provider.   Do not take any new medicines (prescription, over-the-counter, or nutritional supplements) unless approved by your health care provider. Many medicines can worsen your kidney damage or need to have the dose adjusted.   Keep all follow-up visits as directed by your health care provider. MAKE SURE YOU:  Understand these instructions.  Will watch your condition.  Will get help right away if you are not doing well or get worse. Document Released: 12/16/2003 Document Revised: 02/09/2014 Document Reviewed: 05/24/2012 [employer] Patient  Information 2015 [employer] [place]. This information is not intended to replace advice given to you by your health care provider. Make sure you discuss any questions you have with your health care provider.     Dialysis Dialysis is a procedure that replaces some of the work healthy kidneys do. It is done when you lose about 85-90% of your kidney function. It may also be done earlier  if your symptoms may be improved by dialysis. During dialysis, wastes, salt, and extra water are removed from the blood, and the levels of certain chemicals in the blood (such as potassium) are maintained. Dialysis is done in sessions. Dialysis sessions are continued until the kidneys get better. If the kidneys cannot get better, such as in end-stage kidney disease, dialysis is continued for life or until you receive a new kidney (kidney transplant). There are two types of dialysis: hemodialysis and peritoneal dialysis. WHAT IS HEMODIALYSIS?  Hemodialysis is a type of dialysis in which a machine called a dialyzer is used to filter the blood. Before beginning hemodialysis, you will have surgery to create a site where blood can be removed from the body and returned to the body (vascular access). There are three types of vascular accesses:  Arteriovenous fistula. To create this type of access, an artery is connected to a vein (usually in the arm). A fistula takes 1-6 months to develop after surgery. If it develops properly, it usually lasts longer than the other types of vascular accesses. It is also less likely to become infected and cause blood clots.  Arteriovenous graft. To create this type of access, an artery and a vein in the arm are connected with a tube. A graft may be used within 2-3 weeks of surgery.  A venous catheter. To create this type of access, a thin, flexible tube (catheter) is placed in a large vein in your neck, chest, or groin. A catheter may be used right away. It is usually used as a temporary access when dialysis needs to begin immediately. During hemodialysis, blood leaves the body through your access. It travels through a tube to the dialyzer, where it is filtered. The blood then returns to your body through another tube. Hemodialysis is usually performed by a health care provider at a hospital or dialysis center three times a week. Visits last about 3-4 hours. It may also be  performed with the help of another person at home with training.  WHAT IS PERITONEAL DIALYSIS? Peritoneal dialysis is a type of dialysis in which the thin lining of the abdomen (peritoneum) is used as a filter. Before beginning peritoneal dialysis, you will have surgery to place a catheter in your abdomen. The catheter will be used to transfer a fluid called dialysate to and from your abdomen. At the start of a session, your abdomen is filled with dialysate. During the session, wastes, salt, and extra water in the blood pass through the peritoneum and into the dialysate. The dialysate is drained from the body at the end of the session. The process of filling and draining the dialysate is called an exchange. Exchanges are repeated until you have used up all the dialysate for the day. Peritoneal dialysis may be performed by you at home or at almost any other location. It is done every day. You may need up to five exchanges a day. The amount of time the dialysate is in your body between exchanges is called a dwell. The dwell depends on the number of exchanges needed and the  characteristics of the peritoneum. It usually varies from 1.5-3 hours. You may go about your day normally between exchanges. Alternately, the exchanges may be done at night while you sleep, using a machine called a cycler. WHICH TYPE OF DIALYSIS SHOULD I CHOOSE?  Both hemodialysis and peritoneal dialysis have advantages and disadvantages. Talk to your health care provider about which type of dialysis would be best for you. Your lifestyle and preferences should be considered along with your medical condition. In some cases, only one type of dialysis may be an option.  Advantages of hemodialysis  It is done less often than peritoneal dialysis.  Someone else can do the dialysis for you.  If you go to a dialysis center, your health care provider will be able to recognize any problems right away.  If you go to a dialysis center, you can  interact with others who are having dialysis. This can provide you with emotional support. Disadvantages of hemodialysis  Hemodialysis may cause cramps and low blood pressure. It may leave you feeling tired on the days you have the treatment.  If you go to a dialysis center, you will need to make weekly appointments and work around the center's schedule.  You will need to take extra care when traveling. If you go to a dialysis center, you will need to make special arrangements to visit a dialysis center near your destination. If you are having treatments at home, you will need to take the dialyzer with you to your destination.  You will need to avoid more foods than you would need to avoid on peritoneal dialysis. Advantages of peritoneal dialysis  It is less likely than hemodialysis to cause cramps and low blood pressure.  You may do exchanges on your own wherever you are, including when you travel.  You do not need to avoid as many foods as you do on hemodialysis. Disadvantages of peritoneal dialysis  It is done more often than hemodialysis.  Performing peritoneal dialysis requires you to have dexterity of the hands. You must also be able to lift bags.  You will have to learn sterilization techniques. You will need to practice them every day to reduce the risk of infection. WHAT CHANGES WILL I NEED TO MAKE TO MY DIET DURING DIALYSIS? Both hemodialysis and peritoneal dialysis require you to make some changes to your diet. For example, you will need to limit your intake of foods high in the minerals phosphorus and potassium. You will also need to limit your fluid intake. Your dietitian can help you plan meals. A good meal plan can improve your dialysis and your health.  WHAT SHOULD I EXPECT WHEN BEGINNING DIALYSIS? Adjusting to the dialysis treatment, schedule, and diet can take some time. You may need to stop working and may not be able to do some of the things you normally do. You may  feel anxious or depressed when beginning dialysis. Eventually, many people feel better overall because of dialysis. Some people are able to return to work after making some changes, such as reducing work intensity. WHERE CAN I FIND MORE INFORMATION?   National Kidney Foundation: www.kidney.org  American Association of Kidney Patients: ResidentialShow.is  American Kidney Fund: www.kidneyfund.org Document Released: 12/16/2002 Document Revised: 02/09/2014 Document Reviewed: 11/19/2012 [facility] Patient Information 2015 [place], [place]. This information is not intended to replace advice given to you by your health care provider. Make sure you discuss any questions you have with your health care provider.    Hypertension Hypertension, commonly called high  blood pressure, is when the force of blood pumping through your arteries is too strong. Your arteries are the blood vessels that carry blood from your heart throughout your body. A blood pressure reading consists of a higher number over a lower number, such as 110/72. The higher number (systolic) is the pressure inside your arteries when your heart pumps. The lower number (diastolic) is the pressure inside your arteries when your heart relaxes. Ideally you want your blood pressure below 120/80. Hypertension forces your heart to work harder to pump blood. Your arteries may become narrow or stiff. Having hypertension puts you at risk for heart disease, stroke, and other problems.  RISK FACTORS Some risk factors for high blood pressure are controllable. Others are not.  Risk factors you cannot control include:   Race. You may be at higher risk if you are African American.  Age. Risk increases with age.  Gender. Men are at higher risk than women before age 24 years. After age 49, women are at higher risk than men. Risk factors you can control include:  Not getting enough exercise or physical activity.  Being overweight.  Getting too much fat, sugar,  calories, or salt in your diet.  Drinking too much alcohol. SIGNS AND SYMPTOMS Hypertension does not usually cause signs or symptoms. Extremely high blood pressure (hypertensive crisis) may cause headache, anxiety, shortness of breath, and nosebleed. DIAGNOSIS  To check if you have hypertension, your health care provider will measure your blood pressure while you are seated, with your arm held at the level of your heart. It should be measured at least twice using the same arm. Certain conditions can cause a difference in blood pressure between your right and left arms. A blood pressure reading that is higher than normal on one occasion does not mean that you need treatment. If one blood pressure reading is high, ask your health care provider about having it checked again. TREATMENT  Treating high blood pressure includes making lifestyle changes and possibly taking medicine. Living a healthy lifestyle can help lower high blood pressure. You may need to change some of your habits. Lifestyle changes may include:  Following the DASH diet. This diet is high in fruits, vegetables, and whole grains. It is low in salt, red meat, and added sugars.  Getting at least 2 hours of brisk physical activity every week.  Losing weight if necessary.  Not smoking.  Limiting alcoholic beverages.  Learning ways to reduce stress. If lifestyle changes are not enough to get your blood pressure under control, your health care provider may prescribe medicine. You may need to take more than one. Work closely with your health care provider to understand the risks and benefits. HOME CARE INSTRUCTIONS  Have your blood pressure rechecked as directed by your health care provider.   Take medicines only as directed by your health care provider. Follow the directions carefully. Blood pressure medicines must be taken as prescribed. The medicine does not work as well when you skip doses. Skipping doses also puts you at risk  for problems.   Do not smoke.   Monitor your blood pressure at home as directed by your health care provider. SEEK MEDICAL CARE IF:   You think you are having a reaction to medicines taken.  You have recurrent headaches or feel dizzy.  You have swelling in your ankles.  You have trouble with your vision. SEEK IMMEDIATE MEDICAL CARE IF:  You develop a severe headache or confusion.  You have unusual  weakness, numbness, or feel faint.  You have severe chest or abdominal pain.  You vomit repeatedly.  You have trouble breathing. MAKE SURE YOU:   Understand these instructions.  Will watch your condition.  Will get help right away if you are not doing well or get worse. Document Released: 09/25/2005 Document Revised: 02/09/2014 Document Reviewed: 07/18/2013 [facility] Patient Information 2015 [place], [place]. This information is not intended to replace advice given to you by your health care provider. Make sure you discuss any questions you have with your health care provider.

## 2015-04-14 NOTE — ED Notes (Signed)
Pt takes dialysis MWF- needing dialysis today. Pt also sts that she hasn't had her medications x 1 week and needs prescriptions called in for refill.

## 2015-04-15 ENCOUNTER — Non-Acute Institutional Stay (HOSPITAL_COMMUNITY): Payer: Medicare Other

## 2015-04-15 ENCOUNTER — Encounter (HOSPITAL_COMMUNITY): Payer: Self-pay | Admitting: *Deleted

## 2015-04-15 ENCOUNTER — Non-Acute Institutional Stay (HOSPITAL_COMMUNITY)
Admission: EM | Admit: 2015-04-15 | Discharge: 2015-04-16 | Discharge status: Against Medical Advice | Payer: Medicare Other | Attending: Emergency Medicine | Admitting: Emergency Medicine

## 2015-04-15 DIAGNOSIS — I120 Hypertensive chronic kidney disease with stage 5 chronic kidney disease or end stage renal disease: Secondary | ICD-10-CM | POA: Diagnosis present

## 2015-04-15 DIAGNOSIS — F17210 Nicotine dependence, cigarettes, uncomplicated: Secondary | ICD-10-CM | POA: Diagnosis not present

## 2015-04-15 DIAGNOSIS — Z21 Asymptomatic human immunodeficiency virus [HIV] infection status: Secondary | ICD-10-CM | POA: Diagnosis not present

## 2015-04-15 DIAGNOSIS — Z992 Dependence on renal dialysis: Secondary | ICD-10-CM | POA: Diagnosis not present

## 2015-04-15 DIAGNOSIS — N19 Unspecified kidney failure: Secondary | ICD-10-CM | POA: Diagnosis present

## 2015-04-15 DIAGNOSIS — N186 End stage renal disease: Secondary | ICD-10-CM | POA: Diagnosis not present

## 2015-04-15 LAB — I-STAT TROPONIN, ED
TROPONIN I, POC: 0.02 ng/mL (ref 0.00–0.08)
Troponin i, poc: 0.01 ng/mL (ref 0.00–0.08)

## 2015-04-15 LAB — I-STAT CHEM 8, ED
BUN: 61 mg/dL — ABNORMAL HIGH (ref 6–20)
Calcium, Ion: 0.92 mmol/L — ABNORMAL LOW (ref 1.13–1.30)
Chloride: 101 mmol/L (ref 101–111)
Creatinine, Ser: 11.4 mg/dL — ABNORMAL HIGH (ref 0.44–1.00)
Glucose, Bld: 85 mg/dL (ref 65–99)
HEMATOCRIT: 32 % — AB (ref 36.0–46.0)
HEMOGLOBIN: 10.9 g/dL — AB (ref 12.0–15.0)
POTASSIUM: 4.7 mmol/L (ref 3.5–5.1)
Sodium: 136 mmol/L (ref 135–145)
TCO2: 22 mmol/L (ref 0–100)

## 2015-04-15 MED ORDER — DIPHENHYDRAMINE HCL 25 MG PO CAPS
50.0000 mg | ORAL_CAPSULE | Freq: Once | ORAL | Status: AC
  Administered 2015-04-15: 50 mg via ORAL

## 2015-04-15 MED ORDER — PROMETHAZINE HCL 25 MG/ML IJ SOLN
25.0000 mg | Freq: Once | INTRAMUSCULAR | Status: DC
  Filled 2015-04-15: qty 1

## 2015-04-15 MED ORDER — ONDANSETRON HCL 4 MG/2ML IJ SOLN: 4.0000 mg | Freq: Once | INTRAMUSCULAR | Status: DC

## 2015-04-15 NOTE — ED Notes (Addendum)
Pt moved to POD C 28, NAD, MD aware. Pt aware and ok with plan.

## 2015-04-15 NOTE — ED Notes (Signed)
Portable xray at bedside.
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
## 2015-04-15 NOTE — Progress Notes (Signed)
[facility] [address] [place], [place] [id number] [phone]

## 2015-04-15 NOTE — ED Notes (Signed)
Pt yelling and cussing at staff, stating "im tired of this shit", "Ive been waiting all damn day".  Pt also pulling her monitor off of her, PA and MD aware.  PA at bedside.

## 2015-04-15 NOTE — ED Notes (Signed)
RN spoke with HD, per staff it will be "a while" before they will have a open machine for her.

## 2015-04-15 NOTE — ED Notes (Signed)
RN went to give pt medication for nausea, pt sleeping, NAD. Will hold medication and continue to monitor.

## 2015-04-15 NOTE — ED Notes (Signed)
Pt refusing to keep monitor on, monitor on standby, PA aware.  Pt to go to dialysis.

## 2015-04-15 NOTE — ED Notes (Signed)
Pt refusing to keep pulse ox on at this time, stating "I dont need it, I will let you know when I cant breathe, Im only here for dialysis".  Pt educated and encouraged to keep it on, pt refusing.  Will monitor.

## 2015-04-15 NOTE — ED Notes (Addendum)
Pt arrives via [facility] requesting dialysis.  Pt left yesterday before dialysis treatment.  No other complaints.  Pt a x 4, NAD.  BP-142/100 P-100 O2-95% RA.  Pt reports last treatment Tuesday.

## 2015-04-15 NOTE — ED Notes (Signed)
[place] and coffee given to pt, ok per PA.

## 2015-04-15 NOTE — ED Notes (Signed)
RN called HD, according to charge nurse it will be later this evening before pt receives dialysis d/t inpatient pts receiving treatments.

## 2015-04-15 NOTE — ED Notes (Signed)
RN explained to pt multiple times the process and continues to yell and cuss at staff.  Pt also refusing to keep monitor on, PA and MD aware.

## 2015-04-15 NOTE — ED Notes (Addendum)
Pt c/o CP, MD Gwendolyn GrantWalden aware, EKG completed and given to MD.  Pt repositioned in bed.  MD to bedside to assess prior to moving to POD C.

## 2015-04-15 NOTE — ED Provider Notes (Addendum)
Medical screening examination/treatment/procedure(s) were conducted as a shared visit with non-physician practitioner(s) and myself.  I personally evaluated the patient during the encounter.  Pt presented to the ED for routine dialysis.  She is no longer able to receive dialysis at outpatient centers because of behavioral issues.  Pt states she last had dialysis on Tuesday.  Seen yesterday and was told to come back today.  Mild tahcycardia.  HTN but breathing easily.  Will contact nephrology  Linwood DibblesJon Bowdy Bair, MD 04/15/15 1007  Pt is going to have dialysis today.  She has been upset that she has been waiting.  Now complaining of chest pain and shortness of breath.  She feels she is filling up with fluid.  Lungs are clear.  Oxygen sat is normal.  Will check labs and xray.  Linwood DibblesJon Gwen Edler, MD 04/15/15 1238  Pt has remained intermittently agitated about her having to wait for dialysis.  She most recently called 911.  Pt is not hypoxic.  She does not have labored breathing.  Her troponin and electrolytes are unremarkable.  Stable awaiting for dialysis.  Linwood DibblesJon Marynell Bies, MD 04/15/15 [id number]

## 2015-04-15 NOTE — ED Notes (Signed)
RN spoke with HD, nephrologist to see pt, no open beds at this time.  Will update pt.

## 2015-04-15 NOTE — ED Notes (Addendum)
Pt agitated and angry. Pt called 911 and asked them to come and get her and take [facility]. MD Lynelle DoctorKnapp made aware

## 2015-04-15 NOTE — ED Notes (Signed)
PA at bedside.

## 2015-04-15 NOTE — ED Notes (Addendum)
Pt called RN asking if we have talked to dialysis, stating she needs to know when they can take her, stating "if they cannot get me by 12 I am calling the ambulance to take me to [facility]" "they are trying to kill me here by not doing my dialysis".  Pa aware and at bedside.

## 2015-04-15 NOTE — ED Notes (Addendum)
Pt calling out, screaming "help." Pt stating her chest is hurting. Pt refusing RN to place EKG leads, telling RN to leave the door open, stating "y'all don't wanna help me." MD notified, no new orders.

## 2015-04-15 NOTE — ED Notes (Signed)
Phlebotomy at bedside.

## 2015-04-15 NOTE — ED Notes (Signed)
RN to pt room, pt c/o SOB and "tightness in chest"  Stating she has fluid on her lungs and needs dialysis, MD Knapp aware and to bedside to assess.  VSS. 2L O2 applied for comfort per pt request.

## 2015-04-15 NOTE — ED Notes (Signed)
Pt continues to refuse to keep BP cuff and pulse ox on.

## 2015-04-16 ENCOUNTER — Non-Acute Institutional Stay (HOSPITAL_COMMUNITY)
Admission: EM | Admit: 2015-04-16 | Discharge: 2015-04-16 | Disposition: A | Discharge status: Home / Self Care | Payer: Medicare Other | Attending: Nephrology | Admitting: Nephrology

## 2015-04-16 DIAGNOSIS — Z992 Dependence on renal dialysis: Secondary | ICD-10-CM | POA: Diagnosis not present

## 2015-04-16 DIAGNOSIS — F17210 Nicotine dependence, cigarettes, uncomplicated: Secondary | ICD-10-CM | POA: Diagnosis not present

## 2015-04-16 DIAGNOSIS — N186 End stage renal disease: Secondary | ICD-10-CM | POA: Insufficient documentation

## 2015-04-16 DIAGNOSIS — Z21 Asymptomatic human immunodeficiency virus [HIV] infection status: Secondary | ICD-10-CM | POA: Insufficient documentation

## 2015-04-16 DIAGNOSIS — I120 Hypertensive chronic kidney disease with stage 5 chronic kidney disease or end stage renal disease: Secondary | ICD-10-CM | POA: Insufficient documentation

## 2015-04-16 LAB — RENAL FUNCTION PANEL
ANION GAP: 17 — AB (ref 5–15)
Albumin: 3.2 g/dL — ABNORMAL LOW (ref 3.5–5.0)
BUN: 76 mg/dL — ABNORMAL HIGH (ref 6–20)
CALCIUM: 8.2 mg/dL — AB (ref 8.9–10.3)
CO2: 22 mmol/L (ref 22–32)
Chloride: 99 mmol/L — ABNORMAL LOW (ref 101–111)
Creatinine, Ser: 13.24 mg/dL — ABNORMAL HIGH (ref 0.44–1.00)
GFR calc Af Amer: 3 mL/min — ABNORMAL LOW (ref >60)
GFR calc non Af Amer: 3 mL/min — ABNORMAL LOW (ref >60)
Glucose, Bld: 166 mg/dL — ABNORMAL HIGH (ref 65–99)
Phosphorus: 6 mg/dL — ABNORMAL HIGH (ref 2.5–4.6)
Potassium: 5.1 mmol/L (ref 3.5–5.1)
Sodium: 138 mmol/L (ref 135–145)

## 2015-04-16 LAB — CBC
HCT: 24.9 % — ABNORMAL LOW (ref 36.0–46.0)
Hemoglobin: 8.2 g/dL — ABNORMAL LOW (ref 12.0–15.0)
MCH: 34.6 pg — AB (ref 26.0–34.0)
MCHC: 32.9 g/dL (ref 30.0–36.0)
MCV: 105.1 fL — ABNORMAL HIGH (ref 78.0–100.0)
PLATELETS: 210 10*3/uL (ref 150–400)
RBC: 2.37 MIL/uL — ABNORMAL LOW (ref 3.87–5.11)
RDW: 13.7 % (ref 11.5–15.5)
WBC: 8.3 10*3/uL (ref 4.0–10.5)

## 2015-04-16 MED ORDER — SODIUM CHLORIDE 0.9 % IV SOLN: 100.0000 mL | INTRAVENOUS | Status: DC | PRN

## 2015-04-16 MED ORDER — LIDOCAINE-PRILOCAINE 2.5-2.5 % EX CREA
1.0000 "application " | TOPICAL_CREAM | CUTANEOUS | Status: DC | PRN
  Filled 2015-04-16: qty 5

## 2015-04-16 MED ORDER — LIDOCAINE HCL (PF) 1 % IJ SOLN: 5.0000 mL | INTRAMUSCULAR | Status: DC | PRN

## 2015-04-16 MED ORDER — ALTEPLASE 2 MG IJ SOLR
2.0000 mg | Freq: Once | INTRAMUSCULAR | Status: DC | PRN
  Filled 2015-04-16: qty 2

## 2015-04-16 MED ORDER — LIDOCAINE-PRILOCAINE 2.5-2.5 % EX CREA: 1.0000 "application " | TOPICAL_CREAM | CUTANEOUS | Status: DC | PRN

## 2015-04-16 MED ORDER — NEPRO/CARBSTEADY PO LIQD
237.0000 mL | ORAL | Status: DC | PRN
  Filled 2015-04-16: qty 237

## 2015-04-16 MED ORDER — HEPARIN SODIUM (PORCINE) 1000 UNIT/ML DIALYSIS: 1000.0000 [IU] | INTRAMUSCULAR | Status: DC | PRN

## 2015-04-16 MED ORDER — ACETAMINOPHEN 325 MG PO TABS
650.0000 mg | ORAL_TABLET | Freq: Once | ORAL | Status: AC
  Administered 2015-04-16: 650 mg via ORAL
  Filled 2015-04-16: qty 2

## 2015-04-16 MED ORDER — NEPRO/CARBSTEADY PO LIQD: 237.0000 mL | ORAL | Status: DC | PRN

## 2015-04-16 MED ORDER — PENTAFLUOROPROP-TETRAFLUOROETH EX AERO: 1.0000 "application " | INHALATION_SPRAY | CUTANEOUS | Status: DC | PRN

## 2015-04-16 MED ORDER — PENTAFLUOROPROP-TETRAFLUOROETH EX AERO
1.0000 "application " | INHALATION_SPRAY | CUTANEOUS | Status: DC | PRN
  Filled 2015-04-16: qty 30

## 2015-04-16 MED ORDER — ALTEPLASE 2 MG IJ SOLR: 2.0000 mg | Freq: Once | INTRAMUSCULAR | Status: DC | PRN

## 2015-04-16 MED ORDER — HEPARIN SODIUM (PORCINE) 1000 UNIT/ML DIALYSIS
1000.0000 [IU] | INTRAMUSCULAR | Status: DC | PRN
  Filled 2015-04-16: qty 1

## 2015-04-16 MED ORDER — HEPARIN SODIUM (PORCINE) 1000 UNIT/ML DIALYSIS
20.0000 [IU]/kg | INTRAMUSCULAR | Status: DC | PRN
  Filled 2015-04-16: qty 3

## 2015-04-16 NOTE — ED Notes (Signed)
Called dialysis RN to find out time frame before goes to unit for tx, RN states still two people ahead of pt. Pt updated.

## 2015-04-16 NOTE — ED Notes (Addendum)
Pt wanting to leave AMA d/t wait for dialysis tx. Provided wheelchair for patient. Pt verbally agressive with staff.
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
## 2015-04-16 NOTE — ED Notes (Signed)
Called dialysis to establish if patient is going to be dialyzed today. Charge RN states she's waiting on nephrologist to give orders. ED RN informed RN that pt has orders since yesterday.

## 2015-04-16 NOTE — ED Notes (Signed)
Pt continues to call out stating she has pain and needs dialysis. Pt updated and informed that there is only one nurse who dialyzes at night. Pt verbally aggressive to nurse.

## 2015-04-16 NOTE — ED Notes (Signed)
Nurse secretary notified this RN that the pt has called the police multiple times and that she received a call from communications about the pt repeatedly calling the emergency line. Charge notified and doctor notified.

## 2015-04-16 NOTE — ED Notes (Signed)
Dr. McManus at bedside. 

## 2015-04-16 NOTE — ED Notes (Signed)
Pt continues to c/o of chest pain and states "what if I'm having a heart attack, y'all gonna do nothing, I need to see a doctor." Attending MD aware of pt's continuing symptoms. No new orders.

## 2015-04-16 NOTE — ED Notes (Signed)
Pt found on phone with police. Pt was heard saying "I need to see a doctor" the pt stated that she was on the phone with the police, this RN was able to see "emergency call" on pts phone screen. The person on the other end of the phone stated "ma'am please let me speak to the nurse" the pt then stated "No! You need to call them on their phone!" the pt then proceeded to hang up the phone. This RN repeatedly asked the pt what she needed, the only thing the pt stated she needed was her glasses and her doctor. The pt refused to allow this RN to obtain any sets of vitals on the pt.

## 2015-04-16 NOTE — ED Notes (Signed)
Pt left without signing AMA form. Hemodialysis notified.

## 2015-04-16 NOTE — ED Notes (Signed)
Called dialysis, patient will likely receive dialysis tonight, maybe before but probably tonight.

## 2015-04-16 NOTE — ED Notes (Signed)
This RN called dialysis, dialysis notified this RN that the pt would not be able to go to dialysis until at least after 0630, and at that time the ED staff needs to call to see when the pt can go to dialysis.

## 2015-04-16 NOTE — ED Notes (Signed)
Left ama this am, needs dialysis, hx cocaine, hx of non-compliance, does not take her meds.  Requesting Mal[place]key sandwhich and coffee.

## 2015-04-16 NOTE — ED Notes (Signed)
Pt yelling and cussing in exam room, refusing to keep cardiac monitor in place. Vital signs stable. Pt awaiting hemodialysis.

## 2015-04-16 NOTE — ED Notes (Signed)
Pt in wheelchair, states she is leaving to go [facility]. [employer] at bedside.

## 2015-04-16 NOTE — ED Notes (Signed)
Had a talk with pt.she cannot be mean to staff.we are working hard to get her up to dailysis.and hemodailysis. We be able to take her around 6;00am.she need to be nice to them as well.and ask pt.why is she calling the police .she stated that we are trying to kill her,no the the arenot the are trying to help you.you need to be nice to our  Staff. Her ekg was taken.AND SHOWN TO THE DOCTOR.RECHECKED BP-150/97

## 2015-04-19 ENCOUNTER — Encounter (HOSPITAL_COMMUNITY): Payer: Self-pay | Admitting: Emergency Medicine

## 2015-04-19 ENCOUNTER — Emergency Department (HOSPITAL_COMMUNITY)
Admission: EM | Admit: 2015-04-19 | Discharge: 2015-04-20 | Disposition: A | Discharge status: Home / Self Care | Payer: Medicare Other | Attending: Emergency Medicine | Admitting: Emergency Medicine

## 2015-04-19 DIAGNOSIS — Z720 Tobacco use: Secondary | ICD-10-CM | POA: Diagnosis not present

## 2015-04-19 DIAGNOSIS — Z79899 Other long term (current) drug therapy: Secondary | ICD-10-CM | POA: Diagnosis not present

## 2015-04-19 DIAGNOSIS — F419 Anxiety disorder, unspecified: Secondary | ICD-10-CM | POA: Insufficient documentation

## 2015-04-19 DIAGNOSIS — I120 Hypertensive chronic kidney disease with stage 5 chronic kidney disease or end stage renal disease: Secondary | ICD-10-CM | POA: Diagnosis not present

## 2015-04-19 DIAGNOSIS — Z992 Dependence on renal dialysis: Secondary | ICD-10-CM | POA: Insufficient documentation

## 2015-04-19 DIAGNOSIS — N186 End stage renal disease: Secondary | ICD-10-CM | POA: Insufficient documentation

## 2015-04-19 DIAGNOSIS — B20 Human immunodeficiency virus [HIV] disease: Secondary | ICD-10-CM | POA: Insufficient documentation

## 2015-04-19 DIAGNOSIS — Z452 Encounter for adjustment and management of vascular access device: Secondary | ICD-10-CM | POA: Diagnosis present

## 2015-04-19 NOTE — ED Notes (Signed)
Pt refusing to get in gown, pt states she is just here for dialysis and has no complaints.

## 2015-04-19 NOTE — ED Notes (Signed)
Pt c/o chest tightness and sob, states she gets like this when she needs dialysis, nad noted.

## 2015-04-19 NOTE — ED Notes (Signed)
Pt. Needs dialysis , last treatment was Friday
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
## 2015-04-20 DIAGNOSIS — N186 End stage renal disease: Secondary | ICD-10-CM | POA: Diagnosis not present

## 2015-04-20 LAB — CBC WITH DIFFERENTIAL/PLATELET
Basophils Absolute: 0 10*3/uL (ref 0.0–0.1)
Basophils Relative: 0 % (ref 0–1)
Eosinophils Absolute: 0.1 10*3/uL (ref 0.0–0.7)
Eosinophils Relative: 1 % (ref 0–5)
HEMATOCRIT: 28.3 % — AB (ref 36.0–46.0)
Hemoglobin: 9.2 g/dL — ABNORMAL LOW (ref 12.0–15.0)
LYMPHS ABS: 2.2 10*3/uL (ref 0.7–4.0)
Lymphocytes Relative: 32 % (ref 12–46)
MCH: 33.8 pg (ref 26.0–34.0)
MCHC: 32.5 g/dL (ref 30.0–36.0)
MCV: 104 fL — ABNORMAL HIGH (ref 78.0–100.0)
MONOS PCT: 6 % (ref 3–12)
Monocytes Absolute: 0.4 10*3/uL (ref 0.1–1.0)
Neutro Abs: 4.3 10*3/uL (ref 1.7–7.7)
Neutrophils Relative %: 61 % (ref 43–77)
Platelets: 266 10*3/uL (ref 150–400)
RBC: 2.72 MIL/uL — AB (ref 3.87–5.11)
RDW: 13.3 % (ref 11.5–15.5)
WBC: 6.9 10*3/uL (ref 4.0–10.5)

## 2015-04-20 LAB — RENAL FUNCTION PANEL
ANION GAP: 16 — AB (ref 5–15)
Albumin: 3.1 g/dL — ABNORMAL LOW (ref 3.5–5.0)
BUN: 43 mg/dL — ABNORMAL HIGH (ref 6–20)
CALCIUM: 8 mg/dL — AB (ref 8.9–10.3)
CO2: 23 mmol/L (ref 22–32)
CREATININE: 10.31 mg/dL — AB (ref 0.44–1.00)
Chloride: 96 mmol/L — ABNORMAL LOW (ref 101–111)
GFR, EST AFRICAN AMERICAN: 4 mL/min — AB (ref >60)
GFR, EST NON AFRICAN AMERICAN: 4 mL/min — AB (ref >60)
Glucose, Bld: 84 mg/dL (ref 65–99)
PHOSPHORUS: 4.8 mg/dL — AB (ref 2.5–4.6)
POTASSIUM: 4.2 mmol/L (ref 3.5–5.1)
Sodium: 135 mmol/L (ref 135–145)

## 2015-04-20 MED ORDER — DARBEPOETIN ALFA 100 MCG/0.5ML IJ SOSY
100.0000 ug | PREFILLED_SYRINGE | Freq: Once | INTRAMUSCULAR | Status: AC
  Administered 2015-04-20: 100 ug via INTRAVENOUS

## 2015-04-20 MED ORDER — DARBEPOETIN ALFA 100 MCG/0.5ML IJ SOSY
PREFILLED_SYRINGE | INTRAMUSCULAR | Status: AC
  Administered 2015-04-20: 100 ug via INTRAVENOUS
  Filled 2015-04-20: qty 0.5

## 2015-04-20 NOTE — Progress Notes (Signed)
Patient completed hemodialysis treatment. Goal met. Uneventful. Discharged home , left unit with belongings in wheelchair.

## 2015-04-22 ENCOUNTER — Encounter (HOSPITAL_COMMUNITY): Payer: Self-pay | Admitting: Emergency Medicine

## 2015-04-22 ENCOUNTER — Emergency Department (HOSPITAL_COMMUNITY)
Admission: EM | Admit: 2015-04-22 | Discharge: 2015-04-23 | Disposition: A | Discharge status: Home / Self Care | Payer: Medicare Other | Attending: Emergency Medicine | Admitting: Emergency Medicine

## 2015-04-22 DIAGNOSIS — Z21 Asymptomatic human immunodeficiency virus [HIV] infection status: Secondary | ICD-10-CM | POA: Insufficient documentation

## 2015-04-22 DIAGNOSIS — Z720 Tobacco use: Secondary | ICD-10-CM | POA: Insufficient documentation

## 2015-04-22 DIAGNOSIS — N186 End stage renal disease: Secondary | ICD-10-CM | POA: Insufficient documentation

## 2015-04-22 DIAGNOSIS — Z8659 Personal history of other mental and behavioral disorders: Secondary | ICD-10-CM | POA: Diagnosis not present

## 2015-04-22 DIAGNOSIS — Z992 Dependence on renal dialysis: Secondary | ICD-10-CM | POA: Insufficient documentation

## 2015-04-22 DIAGNOSIS — Z79899 Other long term (current) drug therapy: Secondary | ICD-10-CM | POA: Diagnosis not present

## 2015-04-22 DIAGNOSIS — I120 Hypertensive chronic kidney disease with stage 5 chronic kidney disease or end stage renal disease: Secondary | ICD-10-CM | POA: Insufficient documentation

## 2015-04-22 NOTE — ED Notes (Signed)
Pt. is here for her routine hemodialysis , last treatment Tuesday this week .
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
## 2015-04-22 NOTE — ED Notes (Signed)
PA at bedside.

## 2015-04-22 NOTE — ED Notes (Signed)
Per nephrology, HD refusing to do dialysis unless emergent. Pt notified by PA

## 2015-04-23 ENCOUNTER — Non-Acute Institutional Stay (HOSPITAL_COMMUNITY)
Admission: EM | Admit: 2015-04-23 | Discharge: 2015-04-23 | Disposition: A | Discharge status: Home / Self Care | Payer: Medicare Other | Attending: Emergency Medicine | Admitting: Emergency Medicine

## 2015-04-23 ENCOUNTER — Encounter (HOSPITAL_COMMUNITY): Payer: Self-pay

## 2015-04-23 DIAGNOSIS — N186 End stage renal disease: Secondary | ICD-10-CM

## 2015-04-23 DIAGNOSIS — F17210 Nicotine dependence, cigarettes, uncomplicated: Secondary | ICD-10-CM | POA: Insufficient documentation

## 2015-04-23 DIAGNOSIS — I120 Hypertensive chronic kidney disease with stage 5 chronic kidney disease or end stage renal disease: Secondary | ICD-10-CM | POA: Diagnosis present

## 2015-04-23 DIAGNOSIS — Z21 Asymptomatic human immunodeficiency virus [HIV] infection status: Secondary | ICD-10-CM | POA: Insufficient documentation

## 2015-04-23 DIAGNOSIS — Z992 Dependence on renal dialysis: Secondary | ICD-10-CM

## 2015-04-23 LAB — COMPREHENSIVE METABOLIC PANEL
ALT: 11 U/L — AB (ref 14–54)
AST: 14 U/L — AB (ref 15–41)
Albumin: 3.6 g/dL (ref 3.5–5.0)
Alkaline Phosphatase: 487 U/L — ABNORMAL HIGH (ref 38–126)
Anion gap: 17 — ABNORMAL HIGH (ref 5–15)
BILIRUBIN TOTAL: 1.1 mg/dL (ref 0.3–1.2)
BUN: 43 mg/dL — ABNORMAL HIGH (ref 6–20)
CHLORIDE: 95 mmol/L — AB (ref 101–111)
CO2: 23 mmol/L (ref 22–32)
Calcium: 7.4 mg/dL — ABNORMAL LOW (ref 8.9–10.3)
Creatinine, Ser: 12.18 mg/dL — ABNORMAL HIGH (ref 0.44–1.00)
GFR calc non Af Amer: 3 mL/min — ABNORMAL LOW (ref >60)
GFR, EST AFRICAN AMERICAN: 3 mL/min — AB (ref >60)
Glucose, Bld: 102 mg/dL — ABNORMAL HIGH (ref 65–99)
POTASSIUM: 4.3 mmol/L (ref 3.5–5.1)
Sodium: 135 mmol/L (ref 135–145)
Total Protein: 7.5 g/dL (ref 6.5–8.1)

## 2015-04-23 LAB — CBC
HCT: 29.5 % — ABNORMAL LOW (ref 36.0–46.0)
HCT: 31.1 % — ABNORMAL LOW (ref 36.0–46.0)
Hemoglobin: 9.3 g/dL — ABNORMAL LOW (ref 12.0–15.0)
Hemoglobin: 10 g/dL — ABNORMAL LOW (ref 12.0–15.0)
MCH: 34.3 pg — AB (ref 26.0–34.0)
MCH: 35 pg — AB (ref 26.0–34.0)
MCHC: 31.5 g/dL (ref 30.0–36.0)
MCHC: 32.2 g/dL (ref 30.0–36.0)
MCV: 108.9 fL — AB (ref 78.0–100.0)
MCV: 108.7 fL — ABNORMAL HIGH (ref 78.0–100.0)
PLATELETS: 329 10*3/uL (ref 150–400)
PLATELETS: 312 10*3/uL (ref 150–400)
RBC: 2.71 MIL/uL — ABNORMAL LOW (ref 3.87–5.11)
RBC: 2.86 MIL/uL — ABNORMAL LOW (ref 3.87–5.11)
RDW: 13.7 % (ref 11.5–15.5)
RDW: 13.7 % (ref 11.5–15.5)
WBC: 9.1 10*3/uL (ref 4.0–10.5)
WBC: 8.6 10*3/uL (ref 4.0–10.5)

## 2015-04-23 LAB — BASIC METABOLIC PANEL
ANION GAP: 18 — AB (ref 5–15)
BUN: 42 mg/dL — ABNORMAL HIGH (ref 6–20)
CO2: 23 mmol/L (ref 22–32)
CREATININE: 11.11 mg/dL — AB (ref 0.44–1.00)
Calcium: 7.3 mg/dL — ABNORMAL LOW (ref 8.9–10.3)
Chloride: 96 mmol/L — ABNORMAL LOW (ref 101–111)
GFR, EST AFRICAN AMERICAN: 4 mL/min — AB (ref >60)
GFR, EST NON AFRICAN AMERICAN: 3 mL/min — AB (ref >60)
GLUCOSE: 118 mg/dL — AB (ref 65–99)
Potassium: 4.7 mmol/L (ref 3.5–5.1)
Sodium: 137 mmol/L (ref 135–145)

## 2015-04-23 MED ORDER — ACETAMINOPHEN 325 MG PO TABS
650.0000 mg | ORAL_TABLET | Freq: Once | ORAL | Status: AC
  Administered 2015-04-23: 650 mg via ORAL
  Filled 2015-04-23: qty 2

## 2015-04-23 MED ORDER — DARBEPOETIN ALFA 200 MCG/0.4ML IJ SOSY: 200.0000 ug | PREFILLED_SYRINGE | Freq: Once | INTRAMUSCULAR | Status: DC

## 2015-04-23 NOTE — Discharge Instructions (Signed)
Return tomorrow morning for dialysis.  End-Stage Kidney Disease The kidneys are two organs that lie on either side of the spine between the middle of the back and the front of the abdomen. The kidneys:   Remove wastes and extra water from the blood.   Produce important hormones. These help keep bones strong, regulate blood pressure, and help create red blood cells.   Balance the fluids and chemicals in the blood and tissues. End-stage kidney disease occurs when the kidneys are so damaged that they cannot do their job. When the kidneys cannot do their job, life-threatening problems occur. The body cannot stay clean and strong without the help of the kidneys. In end-stage kidney disease, the kidneys cannot get better.You need a new kidney or treatments to do some of the work healthy kidneys do in order to stay alive. CAUSES  End-stage kidney disease usually occurs when a long-lasting (chronic) kidney disease gets worse. It may also occur after the kidneys are suddenly damaged (acute kidney injury).  SYMPTOMS   Swelling (edema) of the legs, ankles, or feet.   Tiredness (lethargy).   Nausea or vomiting.   Confusion.   Problems with urination, such as:   Decreased urine production.   Frequent urination, especially at night.   Frequent accidents in children who are potty trained.   Muscle twitches and cramps.   Persistent itchiness.   Loss of appetite.   Headaches.   Abnormally dark or light skin.   Numbness in the hands or feet.   Easy bruising.   Frequent hiccups.   Menstruation stops. DIAGNOSIS  Your health care provider will measure your blood pressure and take some tests. These may include:   Urine tests.   Blood tests.   Imaging tests, such as:   An ultrasound exam.   Computed tomography (CT).  A kidney biopsy. TREATMENT  There are two treatments for end-stage kidney disease:   A procedure that removes toxic wastes from the body  (dialysis).   Receiving a new kidney (kidney transplant). Both of these treatments have serious risks and consequences. Your health care provider will help you determine which treatment is best for you based on your health, age, and other factors. In addition to having dialysis or a kidney transplant, you may need to take medicines to control high blood pressure (hypertension) and cholesterol and to decrease phosphorus levels in your blood.  HOME CARE INSTRUCTIONS  Follow your prescribed diet.   Take medicines only as directed by your health care provider.   Do not take any new medicines (prescription, over-the-counter, or nutritional supplements) unless approved by your health care provider. Many medicines can worsen your kidney damage or need to have the dose adjusted.   Keep all follow-up visits as directed by your health care provider. MAKE SURE YOU:  Understand these instructions.  Will watch your condition.  Will get help right away if you are not doing well or get worse. Document Released: 12/16/2003 Document Revised: 02/09/2014 Document Reviewed: 05/24/2012 [facility] Patient Information 2015 [place], [place]. This information is not intended to replace advice given to you by your health care provider. Make sure you discuss any questions you have with your health care provider.  Dialysis Dialysis is a procedure that replaces some of the work healthy kidneys do. It is done when you lose about 85-90% of your kidney function. It may also be done earlier if your symptoms may be improved by dialysis. During dialysis, wastes, salt, and extra water are removed from  the blood, and the levels of certain chemicals in the blood (such as potassium) are maintained. Dialysis is done in sessions. Dialysis sessions are continued until the kidneys get better. If the kidneys cannot get better, such as in end-stage kidney disease, dialysis is continued for life or until you receive a new kidney (kidney  transplant). There are two types of dialysis: hemodialysis and peritoneal dialysis. WHAT IS HEMODIALYSIS?  Hemodialysis is a type of dialysis in which a machine called a dialyzer is used to filter the blood. Before beginning hemodialysis, you will have surgery to create a site where blood can be removed from the body and returned to the body (vascular access). There are three types of vascular accesses:  Arteriovenous fistula. To create this type of access, an artery is connected to a vein (usually in the arm). A fistula takes 1-6 months to develop after surgery. If it develops properly, it usually lasts longer than the other types of vascular accesses. It is also less likely to become infected and cause blood clots.  Arteriovenous graft. To create this type of access, an artery and a vein in the arm are connected with a tube. A graft may be used within 2-3 weeks of surgery.  A venous catheter. To create this type of access, a thin, flexible tube (catheter) is placed in a large vein in your neck, chest, or groin. A catheter may be used right away. It is usually used as a temporary access when dialysis needs to begin immediately. During hemodialysis, blood leaves the body through your access. It travels through a tube to the dialyzer, where it is filtered. The blood then returns to your body through another tube. Hemodialysis is usually performed by a health care provider at a hospital or dialysis center three times a week. Visits last about 3-4 hours. It may also be performed with the help of another person at home with training.  WHAT IS PERITONEAL DIALYSIS? Peritoneal dialysis is a type of dialysis in which the thin lining of the abdomen (peritoneum) is used as a filter. Before beginning peritoneal dialysis, you will have surgery to place a catheter in your abdomen. The catheter will be used to transfer a fluid called dialysate to and from your abdomen. At the start of a session, your abdomen is filled  with dialysate. During the session, wastes, salt, and extra water in the blood pass through the peritoneum and into the dialysate. The dialysate is drained from the body at the end of the session. The process of filling and draining the dialysate is called an exchange. Exchanges are repeated until you have used up all the dialysate for the day. Peritoneal dialysis may be performed by you at home or at almost any other location. It is done every day. You may need up to five exchanges a day. The amount of time the dialysate is in your body between exchanges is called a dwell. The dwell depends on the number of exchanges needed and the characteristics of the peritoneum. It usually varies from 1.5-3 hours. You may go about your day normally between exchanges. Alternately, the exchanges may be done at night while you sleep, using a machine called a cycler. WHICH TYPE OF DIALYSIS SHOULD I CHOOSE?  Both hemodialysis and peritoneal dialysis have advantages and disadvantages. Talk to your health care provider about which type of dialysis would be best for you. Your lifestyle and preferences should be considered along with your medical condition. In some cases, only one type  of dialysis may be an option.  Advantages of hemodialysis  It is done less often than peritoneal dialysis.  Someone else can do the dialysis for you.  If you go to a dialysis center, your health care provider will be able to recognize any problems right away.  If you go to a dialysis center, you can interact with others who are having dialysis. This can provide you with emotional support. Disadvantages of hemodialysis  Hemodialysis may cause cramps and low blood pressure. It may leave you feeling tired on the days you have the treatment.  If you go to a dialysis center, you will need to make weekly appointments and work around the center's schedule.  You will need to take extra care when traveling. If you go to a dialysis center, you  will need to make special arrangements to visit a dialysis center near your destination. If you are having treatments at home, you will need to take the dialyzer with you to your destination.  You will need to avoid more foods than you would need to avoid on peritoneal dialysis. Advantages of peritoneal dialysis  It is less likely than hemodialysis to cause cramps and low blood pressure.  You may do exchanges on your own wherever you are, including when you travel.  You do not need to avoid as many foods as you do on hemodialysis. Disadvantages of peritoneal dialysis  It is done more often than hemodialysis.  Performing peritoneal dialysis requires you to have dexterity of the hands. You must also be able to lift bags.  You will have to learn sterilization techniques. You will need to practice them every day to reduce the risk of infection. WHAT CHANGES WILL I NEED TO MAKE TO MY DIET DURING DIALYSIS? Both hemodialysis and peritoneal dialysis require you to make some changes to your diet. For example, you will need to limit your intake of foods high in the minerals phosphorus and potassium. You will also need to limit your fluid intake. Your dietitian can help you plan meals. A good meal plan can improve your dialysis and your health.  WHAT SHOULD I EXPECT WHEN BEGINNING DIALYSIS? Adjusting to the dialysis treatment, schedule, and diet can take some time. You may need to stop working and may not be able to do some of the things you normally do. You may feel anxious or depressed when beginning dialysis. Eventually, many people feel better overall because of dialysis. Some people are able to return to work after making some changes, such as reducing work intensity. WHERE CAN I FIND MORE INFORMATION?   National Kidney Foundation: www.kidney.org  American Association of Kidney Patients: ResidentialShow.is  American Kidney Fund: www.kidneyfund.org Document Released: 12/16/2002 Document Revised:  02/09/2014 Document Reviewed: 11/19/2012 [facility] Patient Information 2015 [place], [place]. This information is not intended to replace advice given to you by your health care provider. Make sure you discuss any questions you have with your health care provider.

## 2015-04-23 NOTE — ED Notes (Addendum)
PA at bedside.

## 2015-04-23 NOTE — ED Notes (Signed)
Pt is here for dialysis. VSS. A&O x4

## 2015-04-23 NOTE — ED Notes (Signed)
Called lab for results of CBC and BMP.  Results still pending.

## 2015-04-23 NOTE — ED Notes (Signed)
Report given to dialysis RN

## 2015-04-23 NOTE — Progress Notes (Signed)
Pt completed hemodialysis tx without complications; pt denies dizziness, SOB, n/v or pain. Pt left the unit on a wheelchair to her awaiting family in the ED.

## 2015-04-23 NOTE — ED Notes (Signed)
Called lab regarding pending results, [name] stating CMP still running.
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
## 2015-04-23 NOTE — ED Notes (Signed)
Called dialysis for pt.  Dialysis reports that they hope to be able to get pt within the next hour.

## 2015-04-24 ENCOUNTER — Encounter (HOSPITAL_COMMUNITY): Payer: Self-pay | Admitting: *Deleted

## 2015-04-24 ENCOUNTER — Non-Acute Institutional Stay (HOSPITAL_COMMUNITY)
Admission: EM | Admit: 2015-04-24 | Discharge: 2015-04-28 | Disposition: A | Discharge status: Other Acute Inpatient Hospital | Payer: Medicare Other | Attending: Nephrology | Admitting: Nephrology

## 2015-04-24 DIAGNOSIS — F419 Anxiety disorder, unspecified: Secondary | ICD-10-CM | POA: Insufficient documentation

## 2015-04-24 DIAGNOSIS — Z6841 Body Mass Index (BMI) 40.0 and over, adult: Secondary | ICD-10-CM | POA: Insufficient documentation

## 2015-04-24 DIAGNOSIS — K219 Gastro-esophageal reflux disease without esophagitis: Secondary | ICD-10-CM | POA: Insufficient documentation

## 2015-04-24 DIAGNOSIS — F329 Major depressive disorder, single episode, unspecified: Secondary | ICD-10-CM | POA: Diagnosis not present

## 2015-04-24 DIAGNOSIS — R4589 Other symptoms and signs involving emotional state: Secondary | ICD-10-CM

## 2015-04-24 DIAGNOSIS — F609 Personality disorder, unspecified: Secondary | ICD-10-CM | POA: Insufficient documentation

## 2015-04-24 DIAGNOSIS — F17210 Nicotine dependence, cigarettes, uncomplicated: Secondary | ICD-10-CM | POA: Insufficient documentation

## 2015-04-24 DIAGNOSIS — Z590 Homelessness: Secondary | ICD-10-CM | POA: Insufficient documentation

## 2015-04-24 DIAGNOSIS — Z21 Asymptomatic human immunodeficiency virus [HIV] infection status: Secondary | ICD-10-CM | POA: Diagnosis not present

## 2015-04-24 DIAGNOSIS — R45851 Suicidal ideations: Secondary | ICD-10-CM | POA: Diagnosis present

## 2015-04-24 DIAGNOSIS — R079 Chest pain, unspecified: Secondary | ICD-10-CM | POA: Diagnosis not present

## 2015-04-24 DIAGNOSIS — Z992 Dependence on renal dialysis: Secondary | ICD-10-CM

## 2015-04-24 DIAGNOSIS — I509 Heart failure, unspecified: Secondary | ICD-10-CM | POA: Insufficient documentation

## 2015-04-24 DIAGNOSIS — I120 Hypertensive chronic kidney disease with stage 5 chronic kidney disease or end stage renal disease: Secondary | ICD-10-CM | POA: Diagnosis not present

## 2015-04-24 DIAGNOSIS — N186 End stage renal disease: Secondary | ICD-10-CM | POA: Diagnosis not present

## 2015-04-24 DIAGNOSIS — R4689 Other symptoms and signs involving appearance and behavior: Secondary | ICD-10-CM

## 2015-04-24 LAB — BASIC METABOLIC PANEL
ANION GAP: 15 (ref 5–15)
BUN: 25 mg/dL — AB (ref 6–20)
CHLORIDE: 100 mmol/L — AB (ref 101–111)
CO2: 25 mmol/L (ref 22–32)
Calcium: 7.8 mg/dL — ABNORMAL LOW (ref 8.9–10.3)
Creatinine, Ser: 8.13 mg/dL — ABNORMAL HIGH (ref 0.44–1.00)
GFR, EST AFRICAN AMERICAN: 5 mL/min — AB (ref >60)
GFR, EST NON AFRICAN AMERICAN: 5 mL/min — AB (ref >60)
Glucose, Bld: 96 mg/dL (ref 65–99)
Potassium: 4.3 mmol/L (ref 3.5–5.1)
Sodium: 140 mmol/L (ref 135–145)

## 2015-04-24 LAB — CBC
HCT: 32.3 % — ABNORMAL LOW (ref 36.0–46.0)
HEMOGLOBIN: 10.2 g/dL — AB (ref 12.0–15.0)
MCH: 34.9 pg — AB (ref 26.0–34.0)
MCHC: 31.6 g/dL (ref 30.0–36.0)
MCV: 110.6 fL — AB (ref 78.0–100.0)
Platelets: 390 10*3/uL (ref 150–400)
RBC: 2.92 MIL/uL — AB (ref 3.87–5.11)
RDW: 14 % (ref 11.5–15.5)
WBC: 6.4 10*3/uL (ref 4.0–10.5)

## 2015-04-24 LAB — ETHANOL: Alcohol, Ethyl (B): 31 mg/dL — ABNORMAL HIGH (ref <5)

## 2015-04-24 LAB — SALICYLATE LEVEL: Salicylate Lvl: <4 mg/dL (ref 2.8–30.0)

## 2015-04-24 LAB — ACETAMINOPHEN LEVEL

## 2015-04-24 MED ORDER — ALBUTEROL SULFATE HFA 108 (90 BASE) MCG/ACT IN AERS: 2.0000 | INHALATION_SPRAY | Freq: Four times a day (QID) | RESPIRATORY_TRACT | Status: DC | PRN

## 2015-04-24 MED ORDER — DIPHENHYDRAMINE HCL 25 MG PO CAPS
50.0000 mg | ORAL_CAPSULE | Freq: Every evening | ORAL | Status: DC | PRN
Start: 2015-04-24 — End: 2015-04-28
  Administered 2015-04-25: 50 mg via ORAL
  Filled 2015-04-24: qty 2

## 2015-04-24 MED ORDER — FLUOXETINE HCL 20 MG PO CAPS
20.0000 mg | ORAL_CAPSULE | Freq: Every day | ORAL | Status: DC
  Administered 2015-04-25 – 2015-04-28 (×3): 20 mg via ORAL
  Filled 2015-04-24 (×6): qty 1

## 2015-04-24 MED ORDER — CINACALCET HCL 30 MG PO TABS
90.0000 mg | ORAL_TABLET | Freq: Every day | ORAL | Status: DC
  Administered 2015-04-25 – 2015-04-28 (×3): 90 mg via ORAL
  Filled 2015-04-24 (×7): qty 3

## 2015-04-24 MED ORDER — TENOFOVIR DISOPROXIL FUMARATE 300 MG PO TABS
300.0000 mg | ORAL_TABLET | ORAL | Status: DC
  Filled 2015-04-24: qty 1

## 2015-04-24 MED ORDER — RALTEGRAVIR POTASSIUM 400 MG PO TABS
400.0000 mg | ORAL_TABLET | Freq: Two times a day (BID) | ORAL | Status: DC
  Administered 2015-04-24 – 2015-04-28 (×7): 400 mg via ORAL
  Filled 2015-04-24 (×11): qty 1

## 2015-04-24 MED ORDER — HYDRALAZINE HCL 25 MG PO TABS
25.0000 mg | ORAL_TABLET | Freq: Three times a day (TID) | ORAL | Status: DC
  Administered 2015-04-25 – 2015-04-28 (×9): 25 mg via ORAL
  Filled 2015-04-24 (×19): qty 1

## 2015-04-24 MED ORDER — LAMIVUDINE 5 MG/ML PO SOLN
25.0000 mg | Freq: Every day | ORAL | Status: DC
  Filled 2015-04-24 (×10): qty 5

## 2015-04-24 MED ORDER — AMLODIPINE BESYLATE 5 MG PO TABS
10.0000 mg | ORAL_TABLET | Freq: Every day | ORAL | Status: DC
  Administered 2015-04-24 – 2015-04-27 (×4): 10 mg via ORAL
  Filled 2015-04-24 (×2): qty 2
  Filled 2015-04-24: qty 1
  Filled 2015-04-24: qty 2
  Filled 2015-04-24: qty 1

## 2015-04-24 MED ORDER — CALCITRIOL 0.5 MCG PO CAPS
2.0000 ug | ORAL_CAPSULE | ORAL | Status: DC
  Administered 2015-04-26 – 2015-04-28 (×2): 2 ug via ORAL
  Filled 2015-04-24 (×3): qty 4

## 2015-04-24 MED ORDER — TIOTROPIUM BROMIDE MONOHYDRATE 18 MCG IN CAPS
18.0000 ug | ORAL_CAPSULE | Freq: Every day | RESPIRATORY_TRACT | Status: DC
  Administered 2015-04-28: 18 ug via RESPIRATORY_TRACT
  Filled 2015-04-24 (×4): qty 5

## 2015-04-24 MED ORDER — SEVELAMER CARBONATE 800 MG PO TABS
2400.0000 mg | ORAL_TABLET | Freq: Three times a day (TID) | ORAL | Status: DC
  Administered 2015-04-25 – 2015-04-28 (×5): 2400 mg via ORAL
  Filled 2015-04-24 (×15): qty 3
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
## 2015-04-24 NOTE — BH Assessment (Signed)
Assessment completed. Consulted Maryjean Mornharles Kober, PA-C who recommended inpatient treatment. Pt will be referred to other facilities once pt is medically cleared. Dr. Gwendolyn GrantWalden has been informed of the recommendation.

## 2015-04-24 NOTE — ED Notes (Signed)
[facility] Expected date:  Expected time:  Means of arrival:  Comments: EMS 31F SI

## 2015-04-24 NOTE — ED Notes (Signed)
Pt states that she is "tired of life, because I dont have a dialysis center, I have HIV, and nobody wants to treat me".

## 2015-04-24 NOTE — ED Notes (Signed)
While taking vitals pt state "god does not want her here any more, when she get out of the ED she will just kill her self. There is no reason for her to live"

## 2015-04-24 NOTE — ED Notes (Signed)
Patient reports "smoking crack and marijuana" earlier today. EMS contacted by patients friend whom reports she is SI. Patient stated that she does not want to live and threatened to harm herself with a butcher knife. Pt  State that no one cares about her. She made a statement "this will be the last time I feel rain". Pt has a shunt in left forearm and right clavicle, hx dialysis /kidney disease, HTN, HIV, CHF. Pt unable to afford medications. VS 128/78, 16 cbg 135

## 2015-04-24 NOTE — BHH Counselor (Signed)
This writer sent out supporting documentation for patient placement to the following facilities:  [facility]  Minoru Chap McNeil, MA OBS Counselor 

## 2015-04-24 NOTE — ED Provider Notes (Signed)
CSN: [id number]     Arrival date & time 04/24/15  2019 History   First MD Initiated Contact with Patient 04/24/15 2027     Chief Complaint  Patient presents with  . Suicidal     (Consider location/radiation/quality/duration/timing/severity/associated sxs/prior Treatment) Patient is a 64 y.o. female presenting with mental health disorder. The history is provided by the patient.  Mental Health Problem Presenting symptoms: suicidal thoughts   Degree of incapacity (severity):  Moderate Onset quality:  Gradual Timing:  Constant Progression:  Unchanged Chronicity:  New Context: stressful life event   Relieved by:  Nothing Worsened by:  Nothing tried   Past Medical History  Diagnosis Date  . Hypertension   . HIV infection   . Morbid obesity   . ESRD (end stage renal disease) on dialysis 09/30/2013    [place] dialysis in [place], [place] around 2009.  ESRD due to HTN vs drug abuse according to pt.  [facility] until Feb 2015 when she was admitted to a SNF due to homelessness and drug abuse.  Then changed to [facility] on TTS schedule.     . Anginal pain   . CHF (congestive heart failure)   . Shortness of breath   . Pneumonia   . Depression   . Anxiety   . GERD (gastroesophageal reflux disease)   . Arthritis   . Anemia   . Hyperkalemia 08/11/2014  . Homeless   . CHF (congestive heart failure)    Past Surgical History  Procedure Laterality Date  . Arteriovenous graft placement      left forearm  . Cardiac catheterization    . Laparotomy      states seh was cut open because seh was bleeding on the inside   Family History  Problem Relation Age of Onset  . Kidney failure Other     niece  . High blood pressure    . Lung cancer    . Breast cancer Neg Hx   . Colon cancer Neg Hx   . Stroke Mother   . HIV/AIDS Brother     died of AIDS   History  Substance Use Topics  . Smoking status: Current Every Day Smoker -- 0.50 packs/day    Types: Cigarettes   Start date: 05/06/2014  . Smokeless tobacco: Never Used     Comment: recently quit  . Alcohol Use: No   OB History    No data available     Review of Systems  Constitutional: Negative for fever.  Respiratory: Negative for cough and shortness of breath.   Psychiatric/Behavioral: Positive for suicidal ideas.  All other systems reviewed and are negative.     Allergies  Trazodone and nefazodone and Morphine and related  Home Medications   Prior to Admission medications   Medication Sig Start Date End Date Taking? Authorizing Provider  albuterol (PROVENTIL HFA;VENTOLIN HFA) 108 (90 BASE) MCG/ACT inhaler Inhale 2 puffs into the lungs every 6 (six) hours as needed for wheezing or shortness of breath.   Yes Historical Provider, MD  amLODipine (NORVASC) 5 MG tablet Take 10 mg by mouth at bedtime.    Yes Historical Provider, MD  calcitRIOL (ROCALTROL) 0.5 MCG capsule Take 2 mcg by mouth daily. Takes on Mon, Wed, and Fri with Dialysis   Yes Historical Provider, MD  cinacalcet (SENSIPAR) 90 MG tablet Take 90 mg by mouth daily.   Yes Historical Provider, MD  colchicine 0.6 MG tablet Take 0.6 mg by mouth daily.  Yes Historical Provider, MD  FLUoxetine (PROZAC) 20 MG capsule Take 20 mg by mouth daily.   Yes Historical Provider, MD  folic acid-vitamin b complex-vitamin c-selenium-zinc (DIALYVITE) 3 MG TABS tablet Take 1 tablet by mouth daily.   Yes Historical Provider, MD  hydrALAZINE (APRESOLINE) 25 MG tablet Take 25 mg by mouth every 8 (eight) hours.   Yes Historical Provider, MD  lamivudine (EPIVIR-HBV) 5 MG/ML solution Take 5 mLs (25 mg total) by mouth daily. 07/14/14  Yes Gardiner Barefoot, MD  raltegravir (ISENTRESS) 400 MG tablet Take 1 tablet (400 mg total) by mouth 2 (two) times daily. 07/14/14  Yes Gardiner Barefoot, MD  sevelamer carbonate (RENVELA) 800 MG tablet Take 3 tablets (2,400 mg total) by mouth 3 (three) times daily with meals. 08/14/14  Yes Erasmo Downer, MD  tenofovir (VIREAD)  300 MG tablet Take 1 tablet (300 mg total) by mouth once a week. Give on Friday 07/14/14  Yes Gardiner Barefoot, MD  tiotropium (SPIRIVA) 18 MCG inhalation capsule Place 18 mcg into inhaler and inhale daily.   Yes Historical Provider, MD  diphenhydrAMINE (BENADRYL) 25 mg capsule Take 50 mg by mouth at bedtime as needed for sleep.    Historical Provider, MD  oxyCODONE-acetaminophen (PERCOCET/ROXICET) 5-325 MG per tablet Take 1-2 tablets by mouth every 6 (six) hours as needed for severe pain. Patient not taking: Reported on 04/24/2015 02/24/15   Gwyneth Sprout, MD   BP 143/71 mmHg  Pulse 85  Temp(Src) 98 F (36.7 C) (Oral)  Resp 20  SpO2 98% Physical Exam  Constitutional: She is oriented to person, place, and time. She appears well-developed and well-nourished. No distress.  HENT:  Head: Normocephalic and atraumatic.  Mouth/Throat: Oropharynx is clear and moist.  Eyes: EOM are normal. Pupils are equal, round, and reactive to light.  Neck: Normal range of motion. Neck supple.  Cardiovascular: Normal rate and regular rhythm.  Exam reveals no friction rub.   No murmur heard. Pulmonary/Chest: Effort normal and breath sounds normal. No respiratory distress. She has no wheezes. She has no rales.  Abdominal: Soft. She exhibits no distension. There is no tenderness. There is no rebound.  Musculoskeletal: Normal range of motion. She exhibits no edema.  Neurological: She is alert and oriented to person, place, and time.  Skin: She is not diaphoretic.  Nursing note and vitals reviewed.   ED Course  Procedures (including critical care time) Labs Review Labs Reviewed  CBC - Abnormal; Notable for the following:    RBC 2.92 (*)    Hemoglobin 10.2 (*)    HCT 32.3 (*)    MCV 110.6 (*)    MCH 34.9 (*)    All other components within normal limits  BASIC METABOLIC PANEL - Abnormal; Notable for the following:    Chloride 100 (*)    BUN 25 (*)    Creatinine, Ser 8.13 (*)    Calcium 7.8 (*)    GFR  calc non Af Amer 5 (*)    GFR calc Af Amer 5 (*)    All other components within normal limits  ETHANOL - Abnormal; Notable for the following:    Alcohol, Ethyl (B) 31 (*)    All other components within normal limits  ACETAMINOPHEN LEVEL - Abnormal; Notable for the following:    Acetaminophen (Tylenol), Serum <10 (*)    All other components within normal limits  SALICYLATE LEVEL  URINE RAPID DRUG SCREEN, HOSP PERFORMED    Imaging Review No results found.  EKG Interpretation   Date/Time:  Saturday April 24 2015 21:52:43 EDT Ventricular Rate:  86 PR Interval:  196 QRS Duration: 111 QT Interval:  431 QTC Calculation: 516 R Axis:   48 Text Interpretation:  Sinus rhythm Prolonged QT interval No significant  change since last tracing Confirmed by Gwendolyn Grant  MD, Raydon Chappuis ([id number]) on  04/24/2015 10:08:31 PM      MDM   Final diagnoses:  Suicidal behavior    Patient here is suicidal. Last dialysis yesterday. See frequently for dialysis as she's been kicked artery dialysis center. She stated to me, "If the [employer] doesn't take me tonight, when I'll go home I'll do it myself." she states she's tired and doesn't want to live anymore. Patient seen by TTS, will need to be transferred to [facility] so she can receive dialysis while she's awaiting placement. Labs ok. Will speak with nephrology. Dr. Arrie Aran with Nephrology notified.  Will send patient to [facility].  Elwin Mocha, MD 04/24/15 2249

## 2015-04-25 DIAGNOSIS — R45851 Suicidal ideations: Secondary | ICD-10-CM | POA: Diagnosis not present

## 2015-04-25 MED ORDER — ACETAMINOPHEN 325 MG PO TABS
650.0000 mg | ORAL_TABLET | Freq: Four times a day (QID) | ORAL | Status: DC | PRN
  Administered 2015-04-25 – 2015-04-27 (×2): 650 mg via ORAL
  Filled 2015-04-25: qty 2

## 2015-04-25 MED ORDER — LAMIVUDINE 10 MG/ML PO SOLN
25.0000 mg | Freq: Every day | ORAL | Status: DC
  Administered 2015-04-25 – 2015-04-28 (×3): 25 mg via ORAL
  Filled 2015-04-25 (×5): qty 5

## 2015-04-25 MED ORDER — PANTOPRAZOLE SODIUM 40 MG PO TBEC
40.0000 mg | DELAYED_RELEASE_TABLET | Freq: Every day | ORAL | Status: DC
  Administered 2015-04-25 – 2015-04-28 (×3): 40 mg via ORAL
  Filled 2015-04-25 (×3): qty 1

## 2015-04-25 NOTE — ED Notes (Addendum)
Pt eating rice krispy treat and drinking coffee. STATES,  "I'M SUICIDAL, HONEY, BECAUSE I'M HOMELESS. I NEED SOMEONE WHO CAN HELP ME AND HELP ME GET INTO AN ASSISTED LIVING."

## 2015-04-25 NOTE — Progress Notes (Signed)
At this time, all geropsych facilities able to accomodate dialysis are at capacity [facility], [facility], [facility], [facility] No referrals made.  Ilean SkillMeghan Amram Maya, MSW, LCSWA Clinical Social Work, Disposition  04/25/2015 [phone]

## 2015-04-25 NOTE — ED Notes (Signed)
Message left for [name]

## 2015-04-25 NOTE — ED Notes (Signed)
[facility] arrived to transport patient. Patient began cursing and yelling at staff, swinging her arms at primary nurse. Patient angry she is being transferred. Patient has been placed under IVC and is waiting on papers to be served at this time. Security and off duty called to bedside. When police arrive to serve papers patient will be discharged into their custody at that time. Lequita HaltMorgan, RN at [facility] updated in change.

## 2015-04-25 NOTE — ED Provider Notes (Signed)
Patient w esrd/hd, personality disorder, awaiting bhh team reassessment.   [facility] aware of patient and will coordinate dialysis as needed.  Filed Vitals:   04/25/15 0623  BP: 120/69  Pulse: 86  Temp: 98.4 F (36.9 C)  Resp: 16     Cathren LaineKevin Jeidy Hoerner, MD 04/25/15 [phone]

## 2015-04-26 DIAGNOSIS — R45851 Suicidal ideations: Secondary | ICD-10-CM | POA: Diagnosis not present

## 2015-04-26 NOTE — ED Notes (Signed)
Spoke with Dialysis states it will later this afternoon before they can get here.

## 2015-04-26 NOTE — Progress Notes (Signed)
CSW and RNCM updated pt re: bed search process.  Explained to pt that we are actively working to place her in a facility, but have not received any offers as of this pm.  Pt repeatedly stated that she would "end it all" if she had to continue living on the streets/not have stable housing.  Pt reports that she has no supports, as most of her family members are "no good" a/o addicted to cocaine, including her daughter, niece and great niece.  Pt is willing to go to an ALF and give up her check at this time.  She states that she will "go anywhere."  CSW explained to pt that she will not be easy to place, based on her behavior, her history of noncompliance, and SA issues.  Pt stated that she knows that she has made many mistakes in the past, but now she really needs help.  Dayshift CSW to f/u with facilities in the an to see if any of them would be willing to take pt.

## 2015-04-26 NOTE — BH Assessment (Addendum)
Per, Dr. Lucianne MussKumar patient does not meet criteria for inpatient hospitalization.    Per. Dr. Lucianne MussKumar - patient will be referred to social work for placement options in a AFL.    Writer informed there RN, Lanora Manis(Elizabeth) and the ER MD (Dr. Theodoro GristBeaten) of the patients disposition.

## 2015-04-26 NOTE — ED Notes (Signed)
Pharmacy called x 2 for meds; pt comfortable with sitter at bedside.

## 2015-04-26 NOTE — ED Notes (Signed)
Patient denise any HI SI

## 2015-04-26 NOTE — ED Notes (Signed)
RN discussed with MD that pt is here and due for dialysis today. Dialysis called and pt may can go around 11:30 and 1:00.

## 2015-04-26 NOTE — ED Notes (Signed)
RN discussed with pt that SW can work to find her a home. Pt is requesting and cooperative with that help at this time.

## 2015-04-26 NOTE — ED Notes (Signed)
Called Pharmacy advised patient still needs Epivir and Renvela advised will send shortly.

## 2015-04-26 NOTE — ED Notes (Signed)
Patient Lunch tray delivered.

## 2015-04-26 NOTE — ED Notes (Signed)
Pt updated about timeframe for dialysis.

## 2015-04-26 NOTE — ED Notes (Signed)
Patient wanting to know when her dialysis will be scheduled

## 2015-04-26 NOTE — ED Notes (Signed)
Pt refused vitals. Nurse notified.  

## 2015-04-26 NOTE — ED Notes (Signed)
Patient is resting comfortably; sitter at bedside °
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
## 2015-04-26 NOTE — ED Notes (Signed)
Turkey sandwich given 

## 2015-04-26 NOTE — BH Assessment (Signed)
Re-Assessment  The patient was re-assessed.  [name] is an 64 year old that reports situational suicidal ideation.  Per patient, "she will not be suicidal as long as she is provided a clean place to live where there are no drugs.   Patient reports that she used $300.00 worth of crack cocaine last Saturday and now she is homeless.    Patient reports that she was residing in [place] but she left because she felt as if the facility had bed bugs.  Patient reports that when she left the facility in she stopped taking her psychiatric medication as well as her medication for the HIV virus.    Patient denies HI/AVH.  Patient reports 5 months of sobriety prior to using crack last Saturday.  Patient reports that she does not have any family contacts that can assist her.  Patient reports that she does receive social security and she wants to go to a new AFL facility.  Patient has an appointment today with dialysis at 11:30am.  Per Dr. Lucianne MussKumar the patient does not meet criteria for inpatient hospitalization and will need assistance from social work for placement to a AFL.

## 2015-04-26 NOTE — Discharge Instructions (Signed)
Suicidal Feelings, How to Help Yourself °Everyone feels sad or unhappy at times, but depressing thoughts and feelings of hopelessness can lead to thoughts of suicide. It can seem as if life is too tough to handle. If you feel as though you have reached the point where suicide is the only answer, it is time to let someone know immediately.  °HOW TO COPE AND PREVENT SUICIDE °· Let family, friends, teachers, or counselors know. Get help. Try not to isolate yourself from those who care about you. Even though you may not feel sociable, talk with someone every day. It is best if it is face-to-face. Remember, they will want to help you. °· Eat a regularly spaced and well-balanced diet. °· Get plenty of rest. °· Avoid alcohol and drugs because they will only make you feel worse and may also lower your inhibitions. Remove them from the home. If you are thinking of taking an overdose of your prescribed medicines, give your medicines to someone who can give them to you one day at a time. If you are on antidepressants, let your caregiver know of your feelings so he or she can provide a safer medicine, if that is a concern. °· Remove weapons or poisons from your home. °· Try to stick to routines. Follow a schedule and remind yourself that you have to keep that schedule every day. °· Set some realistic goals and achieve them. Make a list and cross things off as you go. Accomplishments give a sense of worth. Wait until you are feeling better before doing things you find difficult or unpleasant to do. °· If you are able, try to start exercising. Even half-hour periods of exercise each day will make you feel better. Getting out in the sun or into nature helps you recover from depression faster. If you have a favorite place to walk, take advantage of that. °· Increase safe activities that have always given you pleasure. This may include playing your favorite music, reading a good book, painting a picture, or playing your favorite  instrument. Do whatever takes your mind off your depression. °· Keep your living space well-lighted. °GET HELP °Contact a suicide hotline, crisis center, or local suicide prevention center for help right away. Local centers may include a hospital, clinic, community service organization, social service provider, or health department. °· Call your local emergency services (911 in the [place]). °· Call a suicide hotline: °¨ 1-800-273-TALK (1-800-273-8255) in the [place]. °¨ 1-800-SUICIDE (1-800-784-2433) in the [place]. °¨ 1-888-628-9454 in the [place] for Spanish-speaking counselors. °¨ 1-800-799-4TTY (1-800-799-4889) in the [place] for TTY users. °· Visit the following websites for information and help: °¨ National Suicide Prevention Lifeline: www.suicidepreventionlifeline.org °¨ Hopeline: www.hopeline.com °¨ American Foundation for Suicide Prevention: www.afsp.org °· For lesbian, gay, bisexual, transgender, or questioning youth, contact The Trevor Project: °¨ 1-866-4-U-TREVOR (1-866-488-7386) in the [place]. °¨ www.thetrevorproject.org °· In [place], treatment resources are listed in each province with listings available under [employer] Services or similar titles. Another source for Crisis Centres by Province is located at http://www.suicideprevention.ca/in-crisis-now/find-a-crisis-centre-now/crisis-centres °Document Released: 04/01/2003 Document Revised: 12/18/2011 Document Reviewed: 01/20/2014 °ExitCare® Patient Information ©2015 ExitCare, LLC. This information is not intended to replace advice given to you by your health care provider. Make sure you discuss any questions you have with your health care provider. ° °

## 2015-04-26 NOTE — ED Notes (Signed)
Pharmacy called for medications stated will send shortly.

## 2015-04-26 NOTE — Progress Notes (Signed)
ALF bed search initiated on behalf of pt.  CSW will continue to follow up for bed offers and coordinate ALF transfer as appropriate.

## 2015-04-26 NOTE — ED Provider Notes (Addendum)
Patient is not going to be getting dialysis until tomorrow. Thus, patient will be discharge and she can get this as an outpatient.  Jean LovelessScott Penne Rosenstock, MD 04/26/15 2020  Patient shouting that she's going to kill herself if she leaves tonight. She would like to speak to the Child psychotherapistsocial worker. The social worker as stated that she is looking for placement for the patient but it does not need to keep her here. Patient states while she knows she is looking she wants placement now is still threatening to kill her self. We'll hold off on discharge and reevaluate  Jean LovelessScott Cherine Drumgoole, MD 04/26/15 2105

## 2015-04-26 NOTE — ED Notes (Signed)
Spoke with Patient's family Jovita Gamma(Levern [phone])  per Patient advised patient at the hospital and look for placement.

## 2015-04-27 ENCOUNTER — Non-Acute Institutional Stay (HOSPITAL_COMMUNITY): Payer: Medicare Other

## 2015-04-27 DIAGNOSIS — R45851 Suicidal ideations: Secondary | ICD-10-CM | POA: Diagnosis not present

## 2015-04-27 LAB — CBC
HEMATOCRIT: 28.3 % — AB (ref 36.0–46.0)
Hemoglobin: 9 g/dL — ABNORMAL LOW (ref 12.0–15.0)
MCH: 34 pg (ref 26.0–34.0)
MCHC: 31.8 g/dL (ref 30.0–36.0)
MCV: 106.8 fL — ABNORMAL HIGH (ref 78.0–100.0)
Platelets: 318 10*3/uL (ref 150–400)
RBC: 2.65 MIL/uL — AB (ref 3.87–5.11)
RDW: 13.8 % (ref 11.5–15.5)
WBC: 8.3 10*3/uL (ref 4.0–10.5)

## 2015-04-27 LAB — TROPONIN I

## 2015-04-27 LAB — RENAL FUNCTION PANEL
ALBUMIN: 3.2 g/dL — AB (ref 3.5–5.0)
Anion gap: 15 (ref 5–15)
BUN: 51 mg/dL — ABNORMAL HIGH (ref 6–20)
CHLORIDE: 98 mmol/L — AB (ref 101–111)
CO2: 23 mmol/L (ref 22–32)
Calcium: 7.6 mg/dL — ABNORMAL LOW (ref 8.9–10.3)
Creatinine, Ser: 13.01 mg/dL — ABNORMAL HIGH (ref 0.44–1.00)
GFR calc Af Amer: 3 mL/min — ABNORMAL LOW (ref >60)
GFR calc non Af Amer: 3 mL/min — ABNORMAL LOW (ref >60)
Glucose, Bld: 90 mg/dL (ref 65–99)
Phosphorus: 4.3 mg/dL (ref 2.5–4.6)
Potassium: 4.2 mmol/L (ref 3.5–5.1)
SODIUM: 136 mmol/L (ref 135–145)

## 2015-04-27 MED ORDER — PENTAFLUOROPROP-TETRAFLUOROETH EX AERO: 1.0000 "application " | INHALATION_SPRAY | CUTANEOUS | Status: DC | PRN

## 2015-04-27 MED ORDER — HEPARIN SODIUM (PORCINE) 1000 UNIT/ML DIALYSIS
1000.0000 [IU] | INTRAMUSCULAR | Status: DC | PRN
  Filled 2015-04-27: qty 1

## 2015-04-27 MED ORDER — SODIUM CHLORIDE 0.9 % IV SOLN: 100.0000 mL | INTRAVENOUS | Status: DC | PRN

## 2015-04-27 MED ORDER — ACETAMINOPHEN 325 MG PO TABS
ORAL_TABLET | ORAL | Status: AC
  Filled 2015-04-27: qty 2

## 2015-04-27 MED ORDER — NEPRO/CARBSTEADY PO LIQD
237.0000 mL | ORAL | Status: DC | PRN
  Filled 2015-04-27: qty 237

## 2015-04-27 MED ORDER — ASPIRIN 81 MG PO CHEW
324.0000 mg | CHEWABLE_TABLET | Freq: Once | ORAL | Status: AC
  Administered 2015-04-27: 324 mg via ORAL
  Filled 2015-04-27: qty 4

## 2015-04-27 MED ORDER — ALTEPLASE 2 MG IJ SOLR
2.0000 mg | Freq: Once | INTRAMUSCULAR | Status: DC | PRN
  Filled 2015-04-27: qty 2

## 2015-04-27 MED ORDER — HEPARIN SODIUM (PORCINE) 1000 UNIT/ML DIALYSIS
20.0000 [IU]/kg | INTRAMUSCULAR | Status: DC | PRN
  Filled 2015-04-27: qty 3

## 2015-04-27 MED ORDER — LIDOCAINE HCL (PF) 1 % IJ SOLN: 5.0000 mL | INTRAMUSCULAR | Status: DC | PRN

## 2015-04-27 MED ORDER — LIDOCAINE-PRILOCAINE 2.5-2.5 % EX CREA: 1.0000 "application " | TOPICAL_CREAM | CUTANEOUS | Status: DC | PRN

## 2015-04-27 NOTE — Progress Notes (Addendum)
LCSW has been following patient for placement as she reports she has no where to go. LCSW has sent out several referrals for ALF and group home with denials due to age/behaviors, payor source, or no vacancies.  LCSW followed up with supervisor and MD. NO reason to hold patient for only placement and she must be discharged. One referral is still pending, and LCSW can work on this referral from hospital with patient being discharged.  It is known patient will return for dialysis this week and LCSW can check in on patient.  LCSW will also complete Homeless assessment for [facility].  Met with patient regarding disposition. Aware there are no options at this point for placement and LCSW is actively working with one group home [facility] who is reviewing her information and completed [facility] assessment for case management.   Patient agreeable for shelter and follow up on next day of dialysis.  Patient now reporting she is going to kill herself if she is discharged and sent to a Shelter, after talking and planning with LCSW. MD aware, calling Dr. Dwyane Dee with recommendations.  Awaiting BH to help with case and assist with patient reporting SI. EDP called and requested to speak with [facility]. Spoke with Dwyane Dee who recommends admission to [facility] and transfer. AC aware, and working on a bed.  If patient is transferred to [facility] LCSW will contact Lead SW at [facility] with regards to case and continuity of care.   Lane Hacker, MSW Clinical Social Work: Emergency Room [phone]

## 2015-04-27 NOTE — BHH Counselor (Signed)
This Clinical research associatewriter faxed supporting documentation to [facility] for patient placement. [facility] at [facility] reports they do not have any beds but can review and have follow up tomorrow morning.   Ardelle ParkLatoya McNeil, MA OBS Counselor

## 2015-04-27 NOTE — Care Management (Signed)
ED CM and ED CSW spoke with patient at bedside concerning goals of care. Patient is currently on HD and does not have a dialysis home center due to noncompliance, and [facility]. ED. Patient has been placed several [facility] in the past by [facility], and has left the facilities not wanting to turn over her check monthly. CSW  Is attempting have patiet placed once again in an ALF.  Patient repeatedly stated that "I could go out there and end it all"  CM also discussed end of life and comfort care, patient states that she is not ready to discuss end of life yet. Patient has dialysis scheduled for the am, CSW will continue the search for an ALF bed. Updated Dr. Rayford HalstedGolston patient will be discharged in the morning after dialysis. CWS will continue to  follow with discharge plan

## 2015-04-27 NOTE — Procedures (Signed)
I was present at this dialysis session. I have reviewed the session itself and made appropriate changes.   Sabra Heckyan Avree Szczygiel  MD 04/27/2015, 9:14 AM

## 2015-04-27 NOTE — Progress Notes (Signed)
This CSW attempted to get update from [facility] who [facility]) and spoke briefly with Claris CheMargaret until the phone went out.   Called back and spoke with Fantima [phone]) who reports they do not have any beds but can review and have follow up tomorrow morning. TTS and AC advised-    Reece LevyJanet Tierra Thoma, MSW, [place]CSWA  [phone]

## 2015-04-27 NOTE — ED Notes (Signed)
Breakfast tray ordered 

## 2015-04-27 NOTE — ED Notes (Signed)
Pt c/o of CP, Dr Criss AlvineGoldston made aware and came to see pt. EKG in process. Pt now denies CP.

## 2015-04-27 NOTE — ED Notes (Signed)
Transported Pt to 6 floor dialysis w/ sitter.

## 2015-04-27 NOTE — ED Notes (Signed)
Pt requesting wash cloth and soap to "wash up," which were provided. Pt became irritated with sitter at bedside because pt requesting "privacy so I can wash up." Sitter at doorway

## 2015-04-27 NOTE — ED Provider Notes (Signed)
12:23 AM patient is complaining of chest pain. She describes as a throbbing pain. She states she gets this pain daily and had it earlier today but did not tell anyone about it. Has lasted for a couple minutes and is already starting to improve. Patient states [facility] within the last 8 months or 1 year. She's not know the results of this. She does have a history of ESRD and CHF. Patient is not very concerned about this chest pain and she denies any dyspnea, vomiting, or diaphoresis. EKG is unremarkable. She does have significant risk factors but given that she complains of this pain daily I do not feel she warrants admission. At this point will obtain serial troponins, give aspirin, and obtain records from [facility].   EKG Interpretation  Date/Time:  Tuesday April 27 2015 00:04:32 EDT Ventricular Rate:  86 PR Interval:  183 QRS Duration: 110 QT Interval:  451 QTC Calculation: 539 R Axis:   53 Text Interpretation:  Sinus rhythm Prolonged QT interval no acute ST/T changes no significant change since 3 days ago Confirmed by Criss AlvineGOLDSTON  MD, Taariq Leitz ([id number]) on 04/27/2015 12:25:00 AM        Pricilla LovelessScott Adaia Matthies, MD 04/27/15 0025

## 2015-04-27 NOTE — ED Provider Notes (Signed)
Patient resting comfortably asymptomatic. She continues to feel suicidal. I Spoke with Dr. Lucianne Muss who will arrange for transfer to [facility]. Accepting MD is Dr. Jennet Maduro Results for orders placed or performed during the hospital encounter of 04/24/15  CBC  Result Value Ref Range   WBC 6.4 4.0 - 10.5 K/uL   RBC 2.92 (L) 3.87 - 5.11 MIL/uL   Hemoglobin 10.2 (L) 12.0 - 15.0 g/dL   HCT 16.1 (L) 09.6 - 04.5 %   MCV 110.6 (H) 78.0 - 100.0 fL   MCH 34.9 (H) 26.0 - 34.0 pg   MCHC 31.6 30.0 - 36.0 g/dL   RDW 40.9 81.1 - 91.4 %   Platelets 390 150 - 400 K/uL  Basic metabolic panel  Result Value Ref Range   Sodium 140 135 - 145 mmol/L   Potassium 4.3 3.5 - 5.1 mmol/L   Chloride 100 (L) 101 - 111 mmol/L   CO2 25 22 - 32 mmol/L   Glucose, Bld 96 65 - 99 mg/dL   BUN 25 (H) 6 - 20 mg/dL   Creatinine, Ser 7.82 (H) 0.44 - 1.00 mg/dL   Calcium 7.8 (L) 8.9 - 10.3 mg/dL   GFR calc non Af Amer 5 (L) >60 mL/min   GFR calc Af Amer 5 (L) >60 mL/min   Anion gap 15 5 - 15  Ethanol  Result Value Ref Range   Alcohol, Ethyl (B) 31 (H) <5 mg/dL  Salicylate level  Result Value Ref Range   Salicylate Lvl <4.0 2.8 - 30.0 mg/dL  Acetaminophen level  Result Value Ref Range   Acetaminophen (Tylenol), Serum <10 (L) 10 - 30 ug/mL  Troponin I  Result Value Ref Range   Troponin I <0.03 <0.031 ng/mL  Troponin I  Result Value Ref Range   Troponin I <0.03 <0.031 ng/mL  Renal function panel  Result Value Ref Range   Sodium 136 135 - 145 mmol/L   Potassium 4.2 3.5 - 5.1 mmol/L   Chloride 98 (L) 101 - 111 mmol/L   CO2 23 22 - 32 mmol/L   Glucose, Bld 90 65 - 99 mg/dL   BUN 51 (H) 6 - 20 mg/dL   Creatinine, Ser 95.62 (H) 0.44 - 1.00 mg/dL   Calcium 7.6 (L) 8.9 - 10.3 mg/dL   Phosphorus 4.3 2.5 - 4.6 mg/dL   Albumin 3.2 (L) 3.5 - 5.0 g/dL   GFR calc non Af Amer 3 (L) >60 mL/min   GFR calc Af Amer 3 (L) >60 mL/min   Anion gap 15 5 - 15  CBC  Result Value Ref Range   WBC 8.3 4.0 - 10.5 K/uL   RBC 2.65 (L) 3.87  - 5.11 MIL/uL   Hemoglobin 9.0 (L) 12.0 - 15.0 g/dL   HCT 13.0 (L) 86.5 - 78.4 %   MCV 106.8 (H) 78.0 - 100.0 fL   MCH 34.0 26.0 - 34.0 pg   MCHC 31.8 30.0 - 36.0 g/dL   RDW 69.6 29.5 - 28.4 %   Platelets 318 150 - 400 K/uL   Dg Chest 2 View  04/10/2015   CLINICAL DATA:  Shortness breath, cough x1 day.  Missed dialysis.  EXAM: CHEST - 2 VIEW  COMPARISON:  03/11/2015  FINDINGS: Lungs are clear. Heart size and mediastinal contours are within normal limits. Tortuous atheromatous aorta. No pneumothorax. No effusion. Stable Sclerotic changes in thoracolumbar spine probably related to chronic renal disease.  IMPRESSION: No acute cardiopulmonary disease.   Electronically Signed   By: Algis Downs  Deanne CofferHassell M.D.   On: 04/10/2015 11:25   Dg Chest Port 1 View  04/27/2015   CLINICAL DATA:  Acute onset of right-sided chest pain. Initial encounter.  EXAM: PORTABLE CHEST - 1 VIEW  COMPARISON:  Chest radiograph performed 04/10/2015  FINDINGS: The lungs are well-aerated. Mild left midlung atelectasis is noted. There is no evidence of pleural effusion or pneumothorax.  The cardiomediastinal silhouette is within normal limits. No acute osseous abnormalities are seen.  IMPRESSION: Mild left midlung atelectasis noted; lungs otherwise clear.   Electronically Signed   By: Roanna RaiderJeffery  Chang M.D.   On: 04/27/2015 01:15     Doug SouSam Len Azeez, MD 04/27/15 1411

## 2015-04-28 ENCOUNTER — Telehealth: Payer: Self-pay | Admitting: Professional Counselor

## 2015-04-28 ENCOUNTER — Encounter: Payer: Self-pay | Admitting: Psychiatry

## 2015-04-28 ENCOUNTER — Inpatient Hospital Stay
Admission: EM | Admit: 2015-04-28 | Discharge: 2015-05-06 | DRG: 885 | Disposition: A | Discharge status: Home / Self Care | Payer: Medicare Other | Source: Other Acute Inpatient Hospital | Attending: Psychiatry | Admitting: Psychiatry

## 2015-04-28 DIAGNOSIS — Z590 Homelessness: Secondary | ICD-10-CM

## 2015-04-28 DIAGNOSIS — Z823 Family history of stroke: Secondary | ICD-10-CM | POA: Diagnosis not present

## 2015-04-28 DIAGNOSIS — F1290 Cannabis use, unspecified, uncomplicated: Secondary | ICD-10-CM | POA: Diagnosis present

## 2015-04-28 DIAGNOSIS — M109 Gout, unspecified: Secondary | ICD-10-CM | POA: Diagnosis present

## 2015-04-28 DIAGNOSIS — F17200 Nicotine dependence, unspecified, uncomplicated: Secondary | ICD-10-CM | POA: Diagnosis present

## 2015-04-28 DIAGNOSIS — Z21 Asymptomatic human immunodeficiency virus [HIV] infection status: Secondary | ICD-10-CM | POA: Diagnosis not present

## 2015-04-28 DIAGNOSIS — Z9119 Patient's noncompliance with other medical treatment and regimen: Secondary | ICD-10-CM | POA: Diagnosis present

## 2015-04-28 DIAGNOSIS — Z79899 Other long term (current) drug therapy: Secondary | ICD-10-CM | POA: Diagnosis not present

## 2015-04-28 DIAGNOSIS — N2581 Secondary hyperparathyroidism of renal origin: Secondary | ICD-10-CM | POA: Diagnosis present

## 2015-04-28 DIAGNOSIS — Z830 Family history of human immunodeficiency virus [HIV] disease: Secondary | ICD-10-CM | POA: Diagnosis not present

## 2015-04-28 DIAGNOSIS — D631 Anemia in chronic kidney disease: Secondary | ICD-10-CM | POA: Diagnosis present

## 2015-04-28 DIAGNOSIS — F1420 Cocaine dependence, uncomplicated: Secondary | ICD-10-CM | POA: Diagnosis present

## 2015-04-28 DIAGNOSIS — B20 Human immunodeficiency virus [HIV] disease: Secondary | ICD-10-CM | POA: Diagnosis present

## 2015-04-28 DIAGNOSIS — M1611 Unilateral primary osteoarthritis, right hip: Secondary | ICD-10-CM | POA: Diagnosis present

## 2015-04-28 DIAGNOSIS — N186 End stage renal disease: Secondary | ICD-10-CM

## 2015-04-28 DIAGNOSIS — J449 Chronic obstructive pulmonary disease, unspecified: Secondary | ICD-10-CM | POA: Diagnosis present

## 2015-04-28 DIAGNOSIS — Z7951 Long term (current) use of inhaled steroids: Secondary | ICD-10-CM

## 2015-04-28 DIAGNOSIS — I132 Hypertensive heart and chronic kidney disease with heart failure and with stage 5 chronic kidney disease, or end stage renal disease: Secondary | ICD-10-CM | POA: Diagnosis not present

## 2015-04-28 DIAGNOSIS — F17210 Nicotine dependence, cigarettes, uncomplicated: Secondary | ICD-10-CM | POA: Diagnosis present

## 2015-04-28 DIAGNOSIS — I5042 Chronic combined systolic (congestive) and diastolic (congestive) heart failure: Secondary | ICD-10-CM | POA: Diagnosis present

## 2015-04-28 DIAGNOSIS — Z79891 Long term (current) use of opiate analgesic: Secondary | ICD-10-CM | POA: Diagnosis not present

## 2015-04-28 DIAGNOSIS — Z6841 Body Mass Index (BMI) 40.0 and over, adult: Secondary | ICD-10-CM | POA: Diagnosis not present

## 2015-04-28 DIAGNOSIS — F1410 Cocaine abuse, uncomplicated: Secondary | ICD-10-CM | POA: Diagnosis not present

## 2015-04-28 DIAGNOSIS — F332 Major depressive disorder, recurrent severe without psychotic features: Secondary | ICD-10-CM | POA: Diagnosis present

## 2015-04-28 DIAGNOSIS — K219 Gastro-esophageal reflux disease without esophagitis: Secondary | ICD-10-CM | POA: Diagnosis present

## 2015-04-28 DIAGNOSIS — G4700 Insomnia, unspecified: Secondary | ICD-10-CM | POA: Diagnosis present

## 2015-04-28 DIAGNOSIS — Z992 Dependence on renal dialysis: Secondary | ICD-10-CM | POA: Diagnosis not present

## 2015-04-28 DIAGNOSIS — Z801 Family history of malignant neoplasm of trachea, bronchus and lung: Secondary | ICD-10-CM

## 2015-04-28 DIAGNOSIS — Z800 Family history of malignant neoplasm of digestive organs: Secondary | ICD-10-CM

## 2015-04-28 DIAGNOSIS — I10 Essential (primary) hypertension: Secondary | ICD-10-CM | POA: Diagnosis present

## 2015-04-28 DIAGNOSIS — F410 Panic disorder [episodic paroxysmal anxiety] without agoraphobia: Secondary | ICD-10-CM | POA: Diagnosis present

## 2015-04-28 DIAGNOSIS — Z803 Family history of malignant neoplasm of breast: Secondary | ICD-10-CM | POA: Diagnosis not present

## 2015-04-28 DIAGNOSIS — I120 Hypertensive chronic kidney disease with stage 5 chronic kidney disease or end stage renal disease: Secondary | ICD-10-CM | POA: Diagnosis present

## 2015-04-28 DIAGNOSIS — R45851 Suicidal ideations: Secondary | ICD-10-CM | POA: Diagnosis present

## 2015-04-28 MED ORDER — DIPHENHYDRAMINE HCL 25 MG PO CAPS
50.0000 mg | ORAL_CAPSULE | Freq: Every evening | ORAL | Status: DC | PRN
  Administered 2015-04-29: 50 mg via ORAL
  Filled 2015-04-28: qty 2

## 2015-04-28 MED ORDER — TENOFOVIR DISOPROXIL FUMARATE 300 MG PO TABS
300.0000 mg | ORAL_TABLET | ORAL | Status: DC
  Administered 2015-04-30: 300 mg via ORAL
  Filled 2015-04-28: qty 1

## 2015-04-28 MED ORDER — ACETAMINOPHEN 325 MG PO TABS
650.0000 mg | ORAL_TABLET | Freq: Four times a day (QID) | ORAL | Status: DC | PRN
  Administered 2015-05-03 – 2015-05-06 (×4): 650 mg via ORAL
  Administered 2015-05-06: 325 mg via ORAL
  Filled 2015-04-28 (×4): qty 2

## 2015-04-28 MED ORDER — RALTEGRAVIR POTASSIUM 400 MG PO TABS
400.0000 mg | ORAL_TABLET | Freq: Two times a day (BID) | ORAL | Status: DC
  Administered 2015-04-28 – 2015-04-29 (×2): 400 mg via ORAL
  Filled 2015-04-28 (×3): qty 1

## 2015-04-28 MED ORDER — HYDRALAZINE HCL 25 MG PO TABS
25.0000 mg | ORAL_TABLET | Freq: Three times a day (TID) | ORAL | Status: DC
  Administered 2015-04-28 – 2015-05-06 (×23): 25 mg via ORAL
  Filled 2015-04-28 (×25): qty 1

## 2015-04-28 MED ORDER — CLONIDINE HCL 0.1 MG PO TABS
0.1000 mg | ORAL_TABLET | Freq: Once | ORAL | Status: AC
Start: 2015-04-28 — End: 2015-04-28
  Administered 2015-04-28: 0.1 mg via ORAL
  Filled 2015-04-28: qty 1

## 2015-04-28 MED ORDER — ALBUTEROL SULFATE (2.5 MG/3ML) 0.083% IN NEBU
3.0000 mL | INHALATION_SOLUTION | Freq: Four times a day (QID) | RESPIRATORY_TRACT | Status: DC | PRN
  Administered 2015-04-28 – 2015-05-04 (×4): 3 mL via RESPIRATORY_TRACT
  Filled 2015-04-28 (×4): qty 3

## 2015-04-28 MED ORDER — ALUM & MAG HYDROXIDE-SIMETH 200-200-20 MG/5ML PO SUSP: 30.0000 mL | ORAL | Status: DC | PRN

## 2015-04-28 MED ORDER — FLUOXETINE HCL 20 MG PO CAPS
20.0000 mg | ORAL_CAPSULE | Freq: Every day | ORAL | Status: DC
  Administered 2015-04-29 – 2015-05-06 (×8): 20 mg via ORAL
  Filled 2015-04-28 (×8): qty 1

## 2015-04-28 MED ORDER — NICOTINE 10 MG IN INHA
1.0000 | RESPIRATORY_TRACT | Status: DC | PRN
  Administered 2015-04-28 – 2015-05-01 (×2): 1 via RESPIRATORY_TRACT
  Filled 2015-04-28 (×2): qty 36

## 2015-04-28 MED ORDER — LAMIVUDINE 5 MG/ML PO SOLN
25.0000 mg | Freq: Every day | ORAL | Status: DC
  Filled 2015-04-28 (×7): qty 5

## 2015-04-28 MED ORDER — PANTOPRAZOLE SODIUM 40 MG PO TBEC
40.0000 mg | DELAYED_RELEASE_TABLET | Freq: Every day | ORAL | Status: DC
  Administered 2015-04-29 – 2015-05-06 (×8): 40 mg via ORAL
  Filled 2015-04-28 (×8): qty 1

## 2015-04-28 MED ORDER — MAGNESIUM HYDROXIDE 400 MG/5ML PO SUSP: 30.0000 mL | Freq: Every day | ORAL | Status: DC | PRN

## 2015-04-28 MED ORDER — SEVELAMER CARBONATE 800 MG PO TABS
2400.0000 mg | ORAL_TABLET | Freq: Three times a day (TID) | ORAL | Status: DC
  Administered 2015-04-29 – 2015-05-06 (×17): 2400 mg via ORAL
  Filled 2015-04-28 (×18): qty 3

## 2015-04-28 MED ORDER — NEPHRO-VITE 0.8 MG PO TABS
1.0000 | ORAL_TABLET | Freq: Every day | ORAL | Status: DC
  Administered 2015-04-29 – 2015-05-06 (×7): 1 via ORAL
  Filled 2015-04-28 (×17): qty 1

## 2015-04-28 MED ORDER — COLCHICINE 0.6 MG PO TABS
0.6000 mg | ORAL_TABLET | Freq: Every day | ORAL | Status: DC
  Administered 2015-04-28 – 2015-05-03 (×6): 0.6 mg via ORAL
  Filled 2015-04-28 (×7): qty 1

## 2015-04-28 MED ORDER — AMLODIPINE BESYLATE 5 MG PO TABS
5.0000 mg | ORAL_TABLET | Freq: Every day | ORAL | Status: DC
  Administered 2015-04-28 – 2015-04-30 (×3): 5 mg via ORAL
  Filled 2015-04-28 (×4): qty 1

## 2015-04-28 MED ORDER — TIOTROPIUM BROMIDE MONOHYDRATE 18 MCG IN CAPS
18.0000 ug | ORAL_CAPSULE | Freq: Every day | RESPIRATORY_TRACT | Status: DC
  Administered 2015-04-29 – 2015-05-06 (×8): 18 ug via RESPIRATORY_TRACT
  Filled 2015-04-28 (×2): qty 5
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
## 2015-04-28 NOTE — BHH Counselor (Signed)
Pending review for possible placement with [facility].  

## 2015-04-28 NOTE — Progress Notes (Signed)
Pt accepted to [facility], pending bed assignment.  Ilean SkillMeghan Lowell Makara, MSW, LCSWA Clinical Social Work, Disposition  04/28/2015 [phone]

## 2015-04-28 NOTE — ED Notes (Signed)
resp therapist getting pt spiriva for pt.
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
## 2015-04-28 NOTE — Consult Note (Signed)
Patient Demographics  [name], is a 64 y.o. female   MRN: [id number]   DOB - January 17, 1951  Admit Date - 04/28/2015    Outpatient Primary MD for the patient is No PCP Per Patient  Consult requested in the Hospital by Shari Prows, MD, On 04/28/2015    Reason for consult; management of  hypertension With History of -  Past Medical History  Diagnosis Date  . Hypertension   . HIV infection   . Morbid obesity   . ESRD (end stage renal disease) on dialysis 09/30/2013    Started dialysis in [place], [place] around 2009.  ESRD due to HTN vs drug abuse according to pt.  Was on dialysis at [facility] until Feb 2015 when she was admitted to a SNF due to homelessness and drug abuse.  Then changed to [facility] on TTS schedule.     . Anginal pain   . CHF (congestive heart failure)   . Shortness of breath   . Pneumonia   . Depression   . Anxiety   . GERD (gastroesophageal reflux disease)   . Arthritis   . Anemia   . Hyperkalemia 08/11/2014  . Homeless   . CHF (congestive heart failure)       Past Surgical History  Procedure Laterality Date  . Arteriovenous graft placement      left forearm  . Cardiac catheterization    . Laparotomy      states seh was cut open because seh was bleeding on the inside    No chief complaint on file.    HPI  [name]  is a 64 y.o. female,  with history of ESRD, HIV, hypertension admitted to psych unit secondary to suicidal ideation. Depression and also substance abuse history. We are consulted for management of hypertension, CHF. Patient denies any shortness of breath, chest pain. Finished her routine hemodialysis yesterday. She denies any nausea or vomiting or diarrhea.    Review of Systems    In addition to the HPI above,  No Fever-chills, No Headache, No  changes with Vision or hearing, No problems swallowing food or Liquids, No Chest pain, Cough or Shortness of Breath, No Abdominal pain, No Nausea or Vommitting, Bowel movements are regular, No Blood in stool or Urine, No dysuria, No new skin rashes or bruises, No new joints pains-aches,  No new weakness, tingling, numbness in any extremity, No recent weight gain or loss, No polyuria, polydypsia or polyphagia, No significant Mental Stressors.  A full 10 point Review of Systems was done, except as stated above, all other Review of Systems were negative.   Social History History  Substance Use Topics  . Smoking status: Current Every Day Smoker -- 0.50 packs/day    Types: Cigarettes    Start date: 05/06/2014  . Smokeless tobacco: Never Used     Comment: recently quit  . Alcohol Use: 0.6 oz/week    1 Cans of beer per week     Family History Family  History  Problem Relation Age of Onset  . Kidney failure Other     niece  . High blood pressure    . Lung cancer    . Breast cancer Neg Hx   . Colon cancer Neg Hx   . Stroke Mother   . HIV/AIDS Brother     died of AIDS     Prior to Admission medications   Medication Sig Start Date End Date Taking? Authorizing Provider  albuterol (PROVENTIL HFA;VENTOLIN HFA) 108 (90 BASE) MCG/ACT inhaler Inhale 2 puffs into the lungs every 6 (six) hours as needed for wheezing or shortness of breath.    Historical Provider, MD  amLODipine (NORVASC) 5 MG tablet Take 10 mg by mouth at bedtime.     Historical Provider, MD  calcitRIOL (ROCALTROL) 0.5 MCG capsule Take 2 mcg by mouth daily. Takes on Mon, Wed, and Fri with Dialysis    Historical Provider, MD  cinacalcet (SENSIPAR) 90 MG tablet Take 90 mg by mouth daily.    Historical Provider, MD  colchicine 0.6 MG tablet Take 0.6 mg by mouth daily.    Historical Provider, MD  diphenhydrAMINE (BENADRYL) 25 mg capsule Take 50 mg by mouth at bedtime as needed for sleep.    Historical Provider, MD   FLUoxetine (PROZAC) 20 MG capsule Take 20 mg by mouth daily.    Historical Provider, MD  folic acid-vitamin b complex-vitamin c-selenium-zinc (DIALYVITE) 3 MG TABS tablet Take 1 tablet by mouth daily.    Historical Provider, MD  hydrALAZINE (APRESOLINE) 25 MG tablet Take 25 mg by mouth every 8 (eight) hours.    Historical Provider, MD  lamivudine (EPIVIR-HBV) 5 MG/ML solution Take 5 mLs (25 mg total) by mouth daily. 07/14/14   Gardiner Barefoot, MD  omeprazole (PRILOSEC) 20 MG capsule Take 20 mg by mouth every morning.     Historical Provider, MD  oxyCODONE-acetaminophen (PERCOCET/ROXICET) 5-325 MG per tablet Take 1-2 tablets by mouth every 6 (six) hours as needed for severe pain. Patient not taking: Reported on 04/24/2015 02/24/15   Gwyneth Sprout, MD  raltegravir (ISENTRESS) 400 MG tablet Take 1 tablet (400 mg total) by mouth 2 (two) times daily. 07/14/14   Gardiner Barefoot, MD  sevelamer carbonate (RENVELA) 800 MG tablet Take 3 tablets (2,400 mg total) by mouth 3 (three) times daily with meals. 08/14/14   Erasmo Downer, MD  tenofovir (VIREAD) 300 MG tablet Take 1 tablet (300 mg total) by mouth once a week. Give on Friday 07/14/14   Gardiner Barefoot, MD  tiotropium (SPIRIVA) 18 MCG inhalation capsule Place 18 mcg into inhaler and inhale daily.    Historical Provider, MD    Anti-infectives    None      Scheduled Meds: . amLODipine  5 mg Oral Daily  . colchicine  0.6 mg Oral Daily  . FLUoxetine  20 mg Oral Daily  . hydrALAZINE  25 mg Oral 3 times per day  . pantoprazole  40 mg Oral Daily  . tiotropium  18 mcg Inhalation Daily   Continuous Infusions:  PRN Meds:.acetaminophen, albuterol, alum & mag hydroxide-simeth, magnesium hydroxide, nicotine  Allergies  Allergen Reactions  . Trazodone And Nefazodone Other (See Comments)    "Gives me bad dreams"  . Morphine And Related Hives and Rash    Physical Exam  Vitals  Blood pressure 148/71, pulse 97, temperature 98 F (36.7 C),  temperature source Oral, resp. rate 16, height 5' 3.78" (1.62 m), weight 114.306 kg (252 lb), SpO2  99 %.   1. Alert awake and oriented 2. Normal affect and insight, Not Suicidal or Homicidal, Awake Alert, Oriented X 3.  3. No F.N deficits, ALL C.Nerves Intact, Strength 5/5 all 4 extremities, Sensation intact all 4 extremities, Plantars down going.  4. Ears and Eyes appear Normal, Conjunctivae clear, PERRLA. Moist Oral Mucosa.  5. Supple Neck, No JVD, No cervical lymphadenopathy appriciated, No Carotid Bruits.  6. Symmetrical Chest wall movement, Good air movement bilaterally, CTAB.  7. RRR, No Gallops, Rubs or Murmurs, No Parasternal Heave. Cardiovascular cardiovascular S1-S2 regular  Pulmonary; lungs are clear no wheezes no rales. 8. Positive Bowel Sounds, Abdomen Soft, No tenderness, No organomegaly appriciated,No rebound -guarding or rigidity.  9.  No Cyanosis, Normal Skin Turgor, No Skin Rash or Bruise.  10. Good muscle tone,  joints appear normal , no effusions, Normal ROM.  11. No Palpable Lymph Nodes in Neck or Axillae    Data Review  CBC  Recent Labs Lab 04/24/15 2112 04/27/15 0751  WBC 6.4 8.3  HGB 10.2* 9.0*  HCT 32.3* 28.3*  PLT 390 318  MCV 110.6* 106.8*  MCH 34.9* 34.0  MCHC 31.6 31.8  RDW 14.0 13.8   ------------------------------------------------------------------------------------------------------------------  Chemistries   Recent Labs Lab 04/24/15 2112 04/27/15 0751  NA 140 136  K 4.3 4.2  CL 100* 98*  CO2 25 23  GLUCOSE 96 90  BUN 25* 51*  CREATININE 8.13* 13.01*  CALCIUM 7.8* 7.6*   ------------------------------------------------------------------------------------------------------------------ estimated creatinine clearance is 5.5 mL/min (by C-G formula based on Cr of 13.01). ------------------------------------------------------------------------------------------------------------------ No results for input(s): TSH, T4TOTAL,  T3FREE, THYROIDAB in the last 72 hours.  Invalid input(s): FREET3   Coagulation profile No results for input(s): INR, PROTIME in the last 168 hours. ------------------------------------------------------------------------------------------------------------------- No results for input(s): DDIMER in the last 72 hours. -------------------------------------------------------------------------------------------------------------------  Cardiac Enzymes  Recent Labs Lab 04/27/15 0034 04/27/15 0653  TROPONINI <0.03 <0.03   ------------------------------------------------------------------------------------------------------------------ Invalid input(s): POCBNP   ---------------------------------------------------------------------------------------------------------------  Urinalysis    Component Value Date/Time   COLORURINE Straw 03/13/2012 2001   APPEARANCEUR Clear 03/13/2012 2001   LABSPEC 1.011 03/13/2012 2001   PHURINE 9.0 03/13/2012 2001   GLUCOSEU 150 mg/dL 95/62/1308 6578   HGBUR Negative 03/13/2012 2001   BILIRUBINUR Negative 03/13/2012 2001   KETONESUR Negative 03/13/2012 2001   PROTEINUR 100 mg/dL 46/96/2952 8413   NITRITE Negative 03/13/2012 2001   LEUKOCYTESUR Negative 03/13/2012 2001     Imaging results:   Dg Chest Port 1 View  04/27/2015   CLINICAL DATA:  Acute onset of right-sided chest pain. Initial encounter.  EXAM: PORTABLE CHEST - 1 VIEW  COMPARISON:  Chest radiograph performed 04/10/2015  FINDINGS: The lungs are well-aerated. Mild left midlung atelectasis is noted. There is no evidence of pleural effusion or pneumothorax.  The cardiomediastinal silhouette is within normal limits. No acute osseous abnormalities are seen.  IMPRESSION: Mild left midlung atelectasis noted; lungs otherwise clear.   Electronically Signed   By: Roanna Raider M.D.   On: 04/27/2015 01:15        Assessment & Plan  Principal Problem:   Major depressive disorder, recurrent,  severe without psychotic features Active Problems:   HIV infection   Hypertension   COPD (chronic obstructive pulmonary disease)   CKD (chronic kidney disease) requiring chronic dialysis   Tobacco use disorder   Cocaine use disorder, moderate, dependence   44. 64 year old female patient with the ESRD, COPD, HIV, hypertension. #2 hypertension well controlled continue her present medication including clonidine, hydralazine,  amlodipine. We will make further management accordingly. #3 ESRD on hemodialysis nephrology is consulted for her dialysis needs. #3 HIV patient not taking medications recently ID consult is placed, she she is supposed to be on triple therapy. GERD continue PPIs History of COPD continue to smoke but no wheezing continue her inhalers. Thank you for asking us to see the patient.    AM Labs Ordered, also please review Full Orders  Total time spent; 50 minutes  Thank you for the consult, we will follow the patient with you in the Hospital.   [facility] HospitalKONIDENA,Tanzie Rothschild M.D on 04/28/2015 at 7:16 PM

## 2015-04-28 NOTE — [facility] Suicide Risk Assessment (Signed)
[facility]   Nursing information obtained from:    Demographic factors:    Current Mental Status:    Loss Factors:    Historical Factors:    Risk Reduction Factors:    Total Time spent with patient: 1 hour Principal Problem: Major depressive disorder, recurrent, severe without psychotic features Diagnosis:   Patient Active Problem List   Diagnosis Date Noted  . Tobacco use disorder [Z72.0] 04/28/2015  . Cocaine use disorder, moderate, dependence [F14.20] 04/28/2015  . Uremia [N19] 03/12/2015  . Major depressive disorder, recurrent, severe without psychotic features [F33.2] 03/12/2015  . COPD (chronic obstructive pulmonary disease) [J44.9] 03/08/2015  . CKD (chronic kidney disease) requiring chronic dialysis [N18.6, Z99.2] 03/08/2015  . ESRD needing dialysis [N18.6] 02/26/2015  . Vaginal discharge [N89.8]   . Essential hypertension [I10]   . Chronic combined systolic and diastolic congestive heart failure [I50.42]   . Macrocytic anemia [D53.9]   . Chest pain [R07.9]   . HIV disease [B20]   . ESRD on dialysis [N18.6, Z99.2]   . Hyperkalemia [E87.5] 07/12/2014  . Screening examination for venereal disease [Z11.3] 05/13/2014  . Encounter for long-term (current) use of other medications [Z79.899] 05/13/2014  . Respiratory failure [J96.90] 05/08/2014  . Dyspnea [R06.00] 04/21/2014  . Pulmonary edema [J81.1] 04/21/2014  . Crack cocaine use [F14.90] 01/17/2014  . Osteoarthritis of right hip [M16.11] 12/17/2013  . CCF (congestive cardiac failure) [I50.9] 10/30/2013  . Extreme obesity [E66.01] 10/23/2013  . ESRD (end stage renal disease) on dialysis [N18.6, Z99.2] 09/30/2013  . Anemia secondary to renal failure [D63.1] 02/04/2013  . Compulsive tobacco user syndrome [F17.200] 01/30/2013  . Hyperparathyroidism due to renal insufficiency [N25.81] 01/30/2013  . Anxiety state [F41.1] 01/30/2013  . HIV infection [Z21]   . Hypertension [I10]   . 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  World Science writerHealth Organization [facility](WHO). Score between 0-7:  no or low risk or alcohol related problems. Score between 8-15:  moderate risk of alcohol related problems. Score between 16-19:  high risk of alcohol related problems. Score 20 or above:  warrants further diagnostic evaluation for alcohol dependence and treatment.   CLINICAL FACTORS:   Depression:   Severe Alcohol/Substance Abuse/Dependencies   Musculoskeletal: Strength & Muscle Tone: within normal limits Gait & Station: uses a wheelchair Patient leans: N/A  Psychiatric Specialty Exam: Physical Exam  Nursing note and vitals reviewed.   Review of Systems  All other systems reviewed and are negative.   Blood pressure 148/71, pulse 97, temperature 98 F (36.7 C), temperature source Oral, resp. rate 16, height 5' 3.78" (1.62 m), weight 114.306 kg (252 lb), SpO2 99 %.Body mass index is 43.56 kg/(m^2).  General Appearance: Disheveled  Eye Contact::  Good  Speech:  Clear and Coherent  Volume:  Normal  Mood:  Depressed and Hopeless  Affect:  Flat  Thought Process:  Goal Directed  Orientation:  Full (Time, Place, and Person)  Thought Content:  WDL  Suicidal Thoughts:  Yes.  without intent/plan  Homicidal Thoughts:  No  Memory:  Immediate;   Fair Recent;   Fair Remote;   Fair  Judgement:  Impaired  Insight:  Shallow  Psychomotor Activity:  Normal  Concentration:  Fair  Recall:  [employer]Fair  Fund of Knowledge:Fair  Language: Fair  Akathisia:  No  Handed:  Right  AIMS (if indicated):     Assets:  Communication Skills Desire for Improvement Financial Resources/Insurance  Sleep:     Cognition: WNL  ADL's:  Intact     COGNITIVE FEATURES THAT CONTRIBUTE TO RISK:  None    SUICIDE RISK:   Severe:  Frequent, intense, and  enduring suicidal ideation, specific plan, no subjective intent, but some objective markers of intent (i.e., choice of lethal method), the method is accessible, some limited preparatory behavior, evidence of impaired self-control, severe dysphoria/symptomatology, multiple risk factors present, and few if any protective factors, particularly a lack of social support.  PLAN OF CARE: hospital admission, medication management, dialysis, discharge planning.  Medical Decision Making:  New problem, with additional work up planned, Review of Psycho-Social Stressors (1), Review or order clinical lab tests (1), Review of Medication Regimen & Side Effects (2) and Review of New Medication or Change in Dosage (2)   Ms. Christiano is a 64 year old female with history of depression and multiple medical problems admitted for suicidal ideation in the context of homelessness and treatment noncompliance.  1. Suicidal ideation. The patient is able to contract for safety in the hospital.  2. Mood. She reports that Prozac has been helpful in the past she was started on 20 mg of Prozac.  3. End-stage kidney disease on dialysis. She used to have dialysis Monday Wednesday Friday. Apparently she missed some  Treatments and was not dialyzed yesterday. Nephrology consult in place.  4. HIV. She has not been compliant with her treatment lately. Will ask infectious disease consult.  5. COPD and hypertension. Will continue inhalers. On admission her blood pressure was 151/131. Will ask medicine for help.  6. Smoking. Nicotine products aren't available.  7. Substance abuse. She reports she relapsed on crack last weekend after several months of sobriety.  8. Social. She is homeless but has resources to be placed in assisted living facility. She would like to live in [place] where her son now resides  108. Disposition. To be established.  I certify that inpatient services furnished can reasonably be expected to improve the  patient's condition.   Taquana Bartley 04/28/2015, 6:44 PM

## 2015-04-28 NOTE — BHH Counselor (Signed)
[facility] by Dr. Lucianne MussKumar, per Ilean SkillMeghan Stout. Attending Physician will be Dr. Jennet MaduroPucilowska.  Pt. has been assigned to room 301, [facility] Charge Nurse, Harlow MaresJanet J.  Inake Paper Work has been signed and [facility]. [facility] Staff (Meghan S., Disposition Social Worker) have been made aware of the admission.

## 2015-04-28 NOTE — Tx Team (Signed)
Initial Interdisciplinary Treatment Plan   PATIENT STRESSORS: Health problems Substance abuse   PATIENT STRENGTHS: Average or above average intelligence General fund of knowledge   PROBLEM LIST: Problem List/Patient Goals Date to be addressed Date deferred Reason deferred Estimated date of resolution  depression      homeless                                                 DISCHARGE CRITERIA:  Improved stabilization in mood, thinking, and/or behavior Safe-care adequate arrangements made  PRELIMINARY DISCHARGE PLAN: Outpatient therapy Placement in alternative living arrangements  PATIENT/FAMIILY INVOLVEMENT: This treatment plan has been presented to and reviewed with the patient, [name].  The patient and family have been given the opportunity to ask questions and make suggestions.  Iran Kievit, Sarajane MarekWendy Ann 04/28/2015, 6:56 PM

## 2015-04-28 NOTE — ED Provider Notes (Signed)
BP 155/75 mmHg  Pulse 91  Temp(Src) 98.6 F (37 C) (Oral)  Resp 18  Ht 5\' 4"  (1.626 m)  Wt 254 lb (115.214 kg)  BMI 43.58 kg/m2  SpO2 99% The patient appears reasonably stabilized for transfer considering the current resources, flow, and capabilities available in the ED at this time, and I doubt any other [facility] requiring further screening and/or treatment in the ED prior to transfer.   Zadie Rhineonald Braeley Buskey, MD 04/28/15 [phone]

## 2015-04-28 NOTE — ED Notes (Signed)
[facility]

## 2015-04-28 NOTE — Progress Notes (Signed)
[facility] and if she is accepted to [facility]. Currently awaiting a call back from [facility] with regards to status of admission. Patient is not IVC at this time.  This paperwork was rescinded on Monday when she was to be discharged after dialysis.  Deretha EmoryHannah Naydelin Ziegler LCSW, MSW Clinical Social Work: Emergency Room [phone]

## 2015-04-28 NOTE — Progress Notes (Signed)
Patient admitted to behavioral med unit. Skin assessment completed. No contraband found.

## 2015-04-29 ENCOUNTER — Telehealth: Payer: Self-pay | Admitting: Infectious Diseases

## 2015-04-29 DIAGNOSIS — F1410 Cocaine abuse, uncomplicated: Secondary | ICD-10-CM

## 2015-04-29 DIAGNOSIS — I509 Heart failure, unspecified: Secondary | ICD-10-CM

## 2015-04-29 DIAGNOSIS — I132 Hypertensive heart and chronic kidney disease with heart failure and with stage 5 chronic kidney disease, or end stage renal disease: Secondary | ICD-10-CM

## 2015-04-29 DIAGNOSIS — N186 End stage renal disease: Secondary | ICD-10-CM

## 2015-04-29 DIAGNOSIS — Z21 Asymptomatic human immunodeficiency virus [HIV] infection status: Secondary | ICD-10-CM

## 2015-04-29 DIAGNOSIS — Z992 Dependence on renal dialysis: Secondary | ICD-10-CM

## 2015-04-29 LAB — CBC
HEMATOCRIT: 30.3 % — AB (ref 35.0–47.0)
HEMOGLOBIN: 10.2 g/dL — AB (ref 12.0–16.0)
MCH: 35.2 pg — AB (ref 26.0–34.0)
MCHC: 33.9 g/dL (ref 32.0–36.0)
MCV: 104.1 fL — AB (ref 80.0–100.0)
Platelets: 309 10*3/uL (ref 150–440)
RBC: 2.91 MIL/uL — ABNORMAL LOW (ref 3.80–5.20)
RDW: 14.6 % — ABNORMAL HIGH (ref 11.5–14.5)
WBC: 7.3 10*3/uL (ref 3.6–11.0)

## 2015-04-29 LAB — RENAL FUNCTION PANEL
Albumin: 3.9 g/dL (ref 3.5–5.0)
Anion gap: 17 — ABNORMAL HIGH (ref 5–15)
BUN: 42 mg/dL — AB (ref 6–20)
CHLORIDE: 94 mmol/L — AB (ref 101–111)
CO2: 25 mmol/L (ref 22–32)
Calcium: 8.2 mg/dL — ABNORMAL LOW (ref 8.9–10.3)
Creatinine, Ser: 10.53 mg/dL — ABNORMAL HIGH (ref 0.44–1.00)
GFR calc Af Amer: 4 mL/min — ABNORMAL LOW (ref >60)
GFR calc non Af Amer: 3 mL/min — ABNORMAL LOW (ref >60)
Glucose, Bld: 135 mg/dL — ABNORMAL HIGH (ref 65–99)
POTASSIUM: 4.3 mmol/L (ref 3.5–5.1)
Phosphorus: 4.2 mg/dL (ref 2.5–4.6)
Sodium: 136 mmol/L (ref 135–145)

## 2015-04-29 MED ORDER — NEPRO/CARBSTEADY PO LIQD: 237.0000 mL | Freq: Every day | ORAL | Status: DC

## 2015-04-29 MED ORDER — DIPHENHYDRAMINE HCL 25 MG PO CAPS
50.0000 mg | ORAL_CAPSULE | Freq: Every day | ORAL | Status: DC
  Administered 2015-04-29 – 2015-05-05 (×7): 50 mg via ORAL
  Filled 2015-04-29 (×7): qty 2

## 2015-04-29 MED ORDER — LIDOCAINE HCL (PF) 1 % IJ SOLN
5.0000 mL | INTRAMUSCULAR | Status: DC | PRN
  Filled 2015-04-29: qty 5

## 2015-04-29 MED ORDER — SODIUM CHLORIDE 0.9 % IV SOLN: 100.0000 mL | INTRAVENOUS | Status: DC | PRN

## 2015-04-29 MED ORDER — DOLUTEGRAVIR SODIUM 50 MG PO TABS
50.0000 mg | ORAL_TABLET | Freq: Every day | ORAL | Status: DC
  Administered 2015-04-29 – 2015-05-06 (×8): 50 mg via ORAL
  Filled 2015-04-29 (×9): qty 1

## 2015-04-29 MED ORDER — PENTAFLUOROPROP-TETRAFLUOROETH EX AERO
1.0000 "application " | INHALATION_SPRAY | CUTANEOUS | Status: DC | PRN
  Filled 2015-04-29: qty 30

## 2015-04-29 MED ORDER — LIDOCAINE-PRILOCAINE 2.5-2.5 % EX CREA: 1.0000 "application " | TOPICAL_CREAM | CUTANEOUS | Status: DC | PRN

## 2015-04-29 MED ORDER — HEPARIN SODIUM (PORCINE) 1000 UNIT/ML DIALYSIS
1000.0000 [IU] | INTRAMUSCULAR | Status: DC | PRN
  Filled 2015-04-29: qty 1

## 2015-04-29 MED ORDER — NEPRO/CARBSTEADY PO LIQD: 237.0000 mL | ORAL | Status: DC | PRN

## 2015-04-29 MED ORDER — LAMIVUDINE 10 MG/ML PO SOLN
25.0000 mg | Freq: Every day | ORAL | Status: DC
  Administered 2015-04-30 – 2015-05-06 (×7): 25 mg via ORAL
  Filled 2015-04-29 (×10): qty 5

## 2015-04-29 MED ORDER — DIPHENHYDRAMINE HCL 25 MG PO CAPS
ORAL_CAPSULE | ORAL | Status: AC
  Filled 2015-04-29: qty 2

## 2015-04-29 MED ORDER — NEPRO/CARBSTEADY PO LIQD
237.0000 mL | Freq: Every morning | ORAL | Status: DC
  Administered 2015-04-29 – 2015-05-05 (×5): 237 mL via ORAL

## 2015-04-29 MED ORDER — ALTEPLASE 2 MG IJ SOLR
2.0000 mg | Freq: Once | INTRAMUSCULAR | Status: AC | PRN
  Filled 2015-04-29: qty 2

## 2015-04-29 NOTE — Telephone Encounter (Signed)
Patient asked LCSW to call her neighbor/friend regarding her wheel chair and her clothing. Spoke with friend who reports all her stuff is in the house but she has no car to bring it to patient. Will hold belongings for patient per friend.  Deretha Emory, MSW Clinical Social Work: Emergency Room [phone]

## 2015-04-29 NOTE — Progress Notes (Signed)
[facility] [place] at [facility]   PATIENT NAME: [name]    MR#:  [id number]  DATE OF BIRTH:  05-07-1951  SUBJECTIVE:  CHIEF COMPLAINT:  Suicidal ideations.   She was admitted to behavioral medicine due to suicidal ideations. Hospitalist services following for medical management. Patient's hemodialysis today and clinically feels fine and denies any suicidal ideations presently.  REVIEW OF SYSTEMS:    Review of Systems  Constitutional: Negative for fever and chills.  HENT: Negative for congestion and tinnitus.   Eyes: Negative for blurred vision and double vision.  Respiratory: Negative for cough, shortness of breath and wheezing.   Cardiovascular: Negative for chest pain, orthopnea and PND.  Gastrointestinal: Negative for nausea, vomiting, abdominal pain and diarrhea.  Genitourinary: Negative for dysuria and hematuria.  Neurological: Negative for dizziness, sensory change and focal weakness.  All other systems reviewed and are negative.   Nutrition: Renal diet Tolerating Diet: Yes Tolerating PT: Ambulatory    DRUG ALLERGIES:   Allergies  Allergen Reactions  . Trazodone And Nefazodone Other (See Comments)    "Gives me bad dreams"  . Morphine And Related Hives and Rash    VITALS:  Blood pressure 147/89, pulse 96, temperature 98 F (36.7 C), temperature source Oral, resp. rate 20, height 5' 3.78" (1.62 m), weight 114.306 kg (252 lb), SpO2 98 %.  PHYSICAL EXAMINATION:   Physical Exam  GENERAL:  64 y.o.-year-old patient sitting up in chair in no acute distress.  EYES: Pupils equal, round, reactive to light and accommodation. No scleral icterus. Extraocular muscles intact.  HEENT: Head atraumatic, normocephalic. Oropharynx and nasopharynx clear.  NECK:  Supple, no jugular venous distention. No thyroid enlargement, no tenderness.  LUNGS: Normal breath sounds bilaterally, no wheezing, rales, rhonchi. No use of accessory muscles of  respiration.  CARDIOVASCULAR: S1, S2 RRR. No murmurs, rubs, or gallops.  ABDOMEN: Soft, nontender, nondistended. Bowel sounds present. No organomegaly or mass.  EXTREMITIES: No cyanosis, clubbing or edema b/l.    NEUROLOGIC: Cranial nerves II through XII are intact. No focal Motor or sensory deficits b/l.   PSYCHIATRIC: The patient is alert and oriented x 3.  SKIN: No obvious rash, lesion, or ulcer.   Right upper extremity AV fistula with good bruit and good thrill.  LABORATORY PANEL:   CBC  Recent Labs Lab 04/27/15 0751  WBC 8.3  HGB 9.0*  HCT 28.3*  PLT 318   ------------------------------------------------------------------------------------------------------------------  Chemistries   Recent Labs Lab 04/27/15 0751  NA 136  K 4.2  CL 98*  CO2 23  GLUCOSE 90  BUN 51*  CREATININE 13.01*  CALCIUM 7.6*   ------------------------------------------------------------------------------------------------------------------  Cardiac Enzymes  Recent Labs Lab 04/27/15 0653  TROPONINI <0.03   ------------------------------------------------------------------------------------------------------------------  RADIOLOGY:  No results found.   ASSESSMENT AND PLAN:   64 year old female with past medical history of end-stage renal disease on hemodialysis, HIV, hypertension, gout, GERD, COPD who presented to the hospital due to suicidal ideations and admitted to behavioral medicine.    #1 essential hypertension-blood pressure is fairly controlled presently. -Continue clonidine, hydralazine, Norvasc.  #2 HIV-appreciate infectious disease consult. -Continue HAART w/ Lamivudine, Dolutegravir, Tenofovir -Pending CD4 count, HIV viral load, genotype.  #3 history of gout-no acute attack. Continue colchicine.  #4 GERD-continue Protonix.  #5 secondary hyperparathyroidism-continue Renvela.  #6 end-stage renal disease on hemodialysis-continue dialysis as per  nephrology. -Patient is normally on a Monday Wednesday Friday schedule.  #7 COPD-no acute exacerbation. Continue Spiriva.  #8 depression with suicidal ideations-continue Prozac. -  Continue care as per psychiatry.    All the records are reviewed and case discussed with Care Management/Social Workerr. Management plans discussed with the patient, family and they are in agreement.  CODE STATUS: Full  DVT Prophylaxis: Ambulatory  TOTAL TIME TAKING CARE OF THIS PATIENT: 30 minutes.   POSSIBLE D/C in 1-2 days pending further psychiatric input   Houston Siren M.D on 04/29/2015 at 11:57 AM  Between 7am to 6pm - Pager - [phone]  After 6pm go to [email or web address] - password [email or web address] [facility]  [facility]  Office  [phone]  CC: Primary care physician; No PCP Per Patient

## 2015-04-29 NOTE — Plan of Care (Signed)
Problem: Alteration in mood Goal: LTG-Patient reports reduction in suicidal thoughts (Patient reports reduction in suicidal thoughts and is able to verbalize a safety plan for whenever patient is feeling suicidal)  Outcome: Progressing Denies SI today.     

## 2015-04-29 NOTE — Progress Notes (Signed)
[facility]  ROUNDING NOTE   Subjective:  Patient seen a little over a year ago by our practice. Patient doesn't have an established outpt dialysis center. [place] had an issue with a dialysis tech. She was then released from the clinic and has been dialyzing in EDs at [facility] and [facility]. She has been homeless for several years now.  Contemplated performing suicide given her situation. Now admitted to behavioral medicine.   She has history of substance abuse, used crack cocaine recently per her report.    Objective:  Vital signs in last 24 hours:  Temp:  [97.8 F (36.6 C)-98.6 F (37 C)] 98 F (36.7 C) (07/20 1615) Pulse Rate:  [90-97] 96 (07/21 0708) Resp:  [16-20] 20 (07/21 0708) BP: (141-155)/(71-89) 147/89 mmHg (07/21 0708) SpO2:  [97 %-99 %] 98 % (07/20 2237) Weight:  [114.306 kg (252 lb)] 114.306 kg (252 lb) (07/20 1700)  Weight change:  Filed Weights   04/28/15 1615 04/28/15 1700  Weight: 114.306 kg (252 lb) 114.306 kg (252 lb)    Intake/Output: I/O last 3 completed shifts: In: 480 [P.O.:480] Out: -    Intake/Output this shift:  Total I/O In: 120 [P.O.:120] Out: -   Physical Exam: General: NAD, sitting in wheel chair  Head: Normocephalic, atraumatic. Moist oral mucosal membranes  Eyes: Anicteric  Neck: Supple, trachea midline  Lungs:  Clear to auscultation normal effort  Heart: Regular rate and rhythm no rubs  Abdomen:  Soft, nontender, BS present  Extremities:  1+ peripheral edema.  Neurologic: Nonfocal, moving all four extremities  Skin: No lesions  Access: LUE AVF    Basic Metabolic Panel:  Recent Labs Lab 04/24/15 2112 04/27/15 0751  NA 140 136  K 4.3 4.2  CL 100* 98*  CO2 25 23  GLUCOSE 96 90  BUN 25* 51*  CREATININE 8.13* 13.01*  CALCIUM 7.8* 7.6*  PHOS  --  4.3    Liver Function Tests:  Recent Labs Lab 04/27/15 0751  ALBUMIN 3.2*   No results for input(s): LIPASE, AMYLASE  in the last 168 hours. No results for input(s): AMMONIA in the last 168 hours.  CBC:  Recent Labs Lab 04/24/15 2112 04/27/15 0751  WBC 6.4 8.3  HGB 10.2* 9.0*  HCT 32.3* 28.3*  MCV 110.6* 106.8*  PLT 390 318    Cardiac Enzymes:  Recent Labs Lab 04/27/15 0034 04/27/15 0653  TROPONINI <0.03 <0.03    BNP: Invalid input(s): POCBNP  CBG: No results for input(s): GLUCAP in the last 168 hours.  Microbiology: Results for orders placed or performed during the hospital encounter of 03/08/15  MRSA PCR Screening     Status: None   Collection Time: 03/09/15  2:16 AM  Result Value Ref Range Status   MRSA by PCR NEGATIVE NEGATIVE Final    Comment:        The GeneXpert MRSA Assay (FDA approved for NASAL specimens only), is one component of a comprehensive MRSA colonization surveillance program. It is not intended to diagnose MRSA infection nor to guide or monitor treatment for MRSA infections.     Coagulation Studies: No results for input(s): LABPROT, INR in the last 72 hours.  Urinalysis: No results for input(s): COLORURINE, LABSPEC, PHURINE, GLUCOSEU, HGBUR, BILIRUBINUR, KETONESUR, PROTEINUR, UROBILINOGEN, NITRITE, LEUKOCYTESUR in the last 72 hours.  Invalid input(s): APPERANCEUR    Imaging: No results found.   Medications:     . amLODipine  5 mg Oral Daily  .  b complex-vitamin c-folic acid  1 tablet Oral Daily  . colchicine  0.6 mg Oral Daily  . feeding supplement (NEPRO CARB STEADY)  237 mL Oral Daily  . FLUoxetine  20 mg Oral Daily  . hydrALAZINE  25 mg Oral 3 times per day  . lamivudine  25 mg Oral Daily  . pantoprazole  40 mg Oral Daily  . raltegravir  400 mg Oral BID  . sevelamer carbonate  2,400 mg Oral TID WC  . [START ON 04/30/2015] tenofovir  300 mg Oral Weekly  . tiotropium  18 mcg Inhalation Daily   sodium chloride, sodium chloride, acetaminophen, albuterol, alteplase, alum & mag hydroxide-simeth, diphenhydrAMINE, heparin, lidocaine (PF),  lidocaine-prilocaine, magnesium hydroxide, nicotine, pentafluoroprop-tetrafluoroeth  Assessment/ Plan:  64 y.o. female with pmhx of ESRD on HD (not at an established dialysis center, had an issue at [facility], has been going to ED for dialysis at [facility] and [facility]), HIV, substance abuse, anemia of CKD, SHPTH, dperession, COPD, hypertension, LUE AVF.  1.  ESRD on HD:  Pt doesn't have an established dialysis center, released two years ago from [facility].  Has been going to ED to obtain dialysis at [facility] and [facility].  Last HD on Tuesday. -Will plan for HD again today.  UF target 2kg.   2.  Anemia of CKD:  Hgb 9, start epogen 10000 units IV with HD.   3.  SHPTH:  Continue renvela, check ipth/phos with HD today.  4.  Hypertension:  Continue amlodipine, hydralazine.  5.  Depression/suicidal ideation: Much of this appears situational, on fluoxetine, psych following.   LOS: 1 Jazon Jipson 7/21/20168:52 AM

## 2015-04-29 NOTE — Progress Notes (Signed)
D: Pt is awake and active in the milieu this evening. Pt mood is depressed and her affect is sad/flat. Pt requested an "inhaler" which is part of her bedtime routine. No signs of SOB noted. Pt interacting appropriately with staff and peers.  A: Writer provided emotional support and administered medications as prescribed. Respiratory called to administer breathing tx.   R: Pt received tx in bed and went to sleep immediately afterwards. O2 saturation is WNL. Will continue to monitor.

## 2015-04-29 NOTE — Progress Notes (Signed)
[facility] MD Progress Note  04/29/2015 4:45 PM [name]  MRN:  [id number]  Subjective:  [name] feels much better today. Her mood is improving, affect is brighter. She no longer thinks of suicide. She feels that she is treated with kindness in our hospital. She has seen multiple specialists since admission. I did meet her in dialysis. She is cool and collected pleasant and cooperative. She tolerates medications well. She met with social worker to discuss discharge planning. She was restarted on her multiple medications. Improved from medicine, infectious disease, and nephrology is greatly appreciated..  Principal Problem: Major depressive disorder, recurrent, severe without psychotic features Diagnosis:   Patient Active Problem List   Diagnosis Date Noted  . Tobacco use disorder [Z72.0] 04/28/2015  . Cocaine use disorder, moderate, dependence [F14.20] 04/28/2015  . Uremia [N19] 03/12/2015  . Major depressive disorder, recurrent, severe without psychotic features [F33.2] 03/12/2015  . COPD (chronic obstructive pulmonary disease) [J44.9] 03/08/2015  . CKD (chronic kidney disease) requiring chronic dialysis [N18.6, Z99.2] 03/08/2015  . ESRD needing dialysis [N18.6] 02/26/2015  . Vaginal discharge [N89.8]   . Essential hypertension [I10]   . Chronic combined systolic and diastolic congestive heart failure [I50.42]   . Macrocytic anemia [D53.9]   . Chest pain [R07.9]   . HIV disease [B20]   . ESRD on dialysis [N18.6, Z99.2]   . Hyperkalemia [E87.5] 07/12/2014  . Screening examination for venereal disease [Z11.3] 05/13/2014  . Encounter for long-term (current) use of other medications [Z79.899] 05/13/2014  . Respiratory failure [J96.90] 05/08/2014  . Dyspnea [R06.00] 04/21/2014  . Pulmonary edema [J81.1] 04/21/2014  . Crack cocaine use [F14.90] 01/17/2014  . Osteoarthritis of right hip [M16.11] 12/17/2013  . CCF (congestive cardiac failure) [I50.9] 10/30/2013  . Extreme obesity  [E66.01] 10/23/2013  . ESRD (end stage renal disease) on dialysis [N18.6, Z99.2] 09/30/2013  . Anemia secondary to renal failure [D63.1] 02/04/2013  . Compulsive tobacco user syndrome [F17.200] 01/30/2013  . Hyperparathyroidism due to renal insufficiency [N25.81] 01/30/2013  . Anxiety state [F41.1] 01/30/2013  . HIV infection [Z21]   . Hypertension [I10]   . 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 Hypertension   . HIV infection   . Morbid obesity   . ESRD (end stage renal disease) on dialysis 09/30/2013    Started dialysis in [place], [place] around 2009.  ESRD due to HTN vs drug abuse according to pt.  Was on dialysis at [facility] until Feb 2015 when she was admitted to a SNF due to homelessness and drug abuse.  Then changed to [facility] on TTS schedule.     . Anginal pain   . CHF (congestive heart failure)   . Shortness of breath   . Pneumonia   . Depression   . Anxiety   . GERD (gastroesophageal reflux disease)   . Arthritis   . Anemia   . Hyperkalemia 08/11/2014  . Homeless   . CHF (congestive heart failure)     Past Surgical History  Procedure Laterality Date  . Arteriovenous graft placement      left forearm  . Cardiac catheterization    . Laparotomy      states seh was cut open because seh was bleeding on the inside   Family History:  Family History  Problem Relation Age of Onset  . Kidney failure Other  niece  . High blood pressure    . Lung cancer    . Breast cancer Neg Hx   . Colon cancer Neg Hx   . Stroke Mother   . HIV/AIDS Brother     died of AIDS   Social History:  History  Alcohol Use  . 0.6 oz/week  . 1 Cans of beer per week     History  Drug Use  . 1.00 per week  . Special: Marijuana, "Crack" cocaine    Comment: crack states quit 10/03/13    History   Social History  . Marital Status: Single     Spouse Name: N/A  . Number of Children: N/A  . Years of Education: N/A   Social History Main Topics  . Smoking status: Current Every Day Smoker -- 0.50 packs/day    Types: Cigarettes    Start date: 05/06/2014  . Smokeless tobacco: Never Used     Comment: recently quit  . Alcohol Use: 0.6 oz/week    1 Cans of beer per week  . Drug Use: 1.00 per week    Special: Marijuana, "Crack" cocaine     Comment: crack states quit 10/03/13  . Sexual Activity: No   Other Topics Concern  . None   Social History Narrative   Was recently in [place] and she left without allowing them to arrange for outpatient hemodialysis.  They also were not able to arrange for home health services.        [name] is whom she moved in with currently.  States her niece may be able to help her get to hemodialysis.     Additional History:    Sleep: Good  Appetite:  Good   Assessment:   Musculoskeletal: Strength & Muscle Tone: within normal limits Gait & Station: normal Patient leans: N/A   Psychiatric Specialty Exam: Physical Exam  Nursing note and vitals reviewed.   Review of Systems  All other systems reviewed and are negative.   Blood pressure 125/87, pulse 117, temperature 98.2 F (36.8 C), temperature source Oral, resp. rate 15, height 5' 3.78" (1.62 m), weight 114.306 kg (252 lb), SpO2 98 %.Body mass index is 43.56 kg/(m^2).  General Appearance: Casual  Eye Contact::  Good  Speech:  Normal Rate  Volume:  Normal  Mood:  Depressed  Affect:  Appropriate  Thought Process:  Goal Directed  Orientation:  Full (Time, Place, and Person)  Thought Content:  WDL  Suicidal Thoughts:  Yes.  without intent/plan  Homicidal Thoughts:  No  Memory:  Immediate;   Fair Recent;   Fair Remote;   Fair  Judgement:  Fair  Insight:  Fair  Psychomotor Activity:  Decreased  Concentration:  Fair  Recall:  [employer] of Knowledge:Fair  Language: Fair  Akathisia:  No  Handed:  Right   AIMS (if indicated):     Assets:  Communication Skills Desire for Improvement Financial Resources/Insurance  ADL's:  Intact  Cognition: WNL  Sleep:  Number of Hours: 6     Current Medications: Current Facility-Administered Medications  Medication Dose Route Frequency Provider Last Rate Last Dose  . 0.9 %  sodium chloride infusion  100 mL Intravenous PRN Munsoor Lateef, MD      . 0.9 %  sodium chloride infusion  100 mL Intravenous PRN Munsoor Lateef, MD      . acetaminophen (TYLENOL) tablet 650 mg  650 mg Oral Q6H PRN Clovis Fredrickson, MD      .  albuterol (PROVENTIL) (2.5 MG/3ML) 0.083% nebulizer solution 3 mL  3 mL Inhalation Q6H PRN Clovis Fredrickson, MD   3 mL at 04/28/15 2235  . alteplase (CATHFLO ACTIVASE) injection 2 mg  2 mg Intracatheter Once PRN Munsoor Lateef, MD      . alum & mag hydroxide-simeth (MAALOX/MYLANTA) 762-831-51 MG/5ML suspension 30 mL  30 mL Oral Q4H PRN Estrellita Lasky B Tamisha Nordstrom, MD      . amLODipine (NORVASC) tablet 5 mg  5 mg Oral Daily Lynnmarie Lovett B Rosabella Edgin, MD   5 mg at 04/28/15 1805  . b complex-vitamin c-folic acid (NEPHRO-VITE) tablet 1 tablet  1 tablet Oral Daily Epifanio Lesches, MD   1 tablet at 04/29/15 0831  . colchicine tablet 0.6 mg  0.6 mg Oral Daily Lawonda Pretlow B Sherina Stammer, MD   0.6 mg at 04/29/15 0831  . diphenhydrAMINE (BENADRYL) capsule 50 mg  50 mg Oral QHS PRN Gonzella Lex, MD   50 mg at 04/29/15 0145  . dolutegravir (TIVICAY) tablet 50 mg  50 mg Oral Daily Campbell Riches, MD      . feeding supplement (NEPRO CARB STEADY) liquid 237 mL  237 mL Oral q morning - 10a Munsoor Lateef, MD   237 mL at 04/29/15 1048  . FLUoxetine (PROZAC) capsule 20 mg  20 mg Oral Daily Ennis Heavner B Brittay Mogle, MD   20 mg at 04/29/15 7616  . heparin injection 1,000 Units  1,000 Units Dialysis PRN Munsoor Lateef, MD      . hydrALAZINE (APRESOLINE) tablet 25 mg  25 mg Oral 3 times per day Clovis Fredrickson, MD   25 mg at 04/29/15 0655  . lamiVUDine (EPIVIR) 10  MG/ML solution 25 mg  25 mg Oral Daily Annabelle Rexroad B Evann Koelzer, MD      . lidocaine (PF) (XYLOCAINE) 1 % injection 5 mL  5 mL Intradermal PRN Munsoor Lateef, MD      . lidocaine-prilocaine (EMLA) cream 1 application  1 application Topical PRN Munsoor Lateef, MD      . magnesium hydroxide (MILK OF MAGNESIA) suspension 30 mL  30 mL Oral Daily PRN Neizan Debruhl B Deuntae Kocsis, MD      . nicotine (NICOTROL) 10 MG inhaler 1 continuous puffing  1 continuous puffing Inhalation PRN Clovis Fredrickson, MD   1 continuous puffing at 04/28/15 2011  . pantoprazole (PROTONIX) EC tablet 40 mg  40 mg Oral Daily Clovis Fredrickson, MD   40 mg at 04/29/15 0833  . pentafluoroprop-tetrafluoroeth (GEBAUERS) aerosol 1 application  1 application Topical PRN Munsoor Lateef, MD      . sevelamer carbonate (RENVELA) tablet 2,400 mg  2,400 mg Oral TID WC Epifanio Lesches, MD   2,400 mg at 04/29/15 0831  . [START ON 04/30/2015] tenofovir (VIREAD) tablet 300 mg  300 mg Oral Weekly Epifanio Lesches, MD      . tiotropium (SPIRIVA) inhalation capsule 18 mcg  18 mcg Inhalation Daily Clovis Fredrickson, MD   18 mcg at 04/29/15 0831   Facility-Administered Medications Ordered in Other Encounters  Medication Dose Route Frequency Provider Last Rate Last Dose  . 0.9 %  sodium chloride infusion  100 mL Intravenous PRN Shelle Iron, NP      . 0.9 %  sodium chloride infusion  100 mL Intravenous PRN Shelle Iron, NP      . heparin injection 2,300 Units  20 Units/kg Dialysis Once in dialysis Shelle Iron, NP        Lab Results:  Results for orders placed or  performed during the hospital encounter of 04/28/15 (from the past 48 hour(s))  Renal function panel     Status: Abnormal   Collection Time: 04/29/15 12:54 PM  Result Value Ref Range   Sodium 136 135 - 145 mmol/L   Potassium 4.3 3.5 - 5.1 mmol/L   Chloride 94 (L) 101 - 111 mmol/L   CO2 25 22 - 32 mmol/L   Glucose, Bld 135 (H) 65 - 99 mg/dL   BUN 42 (H) 6 - 20 mg/dL    Creatinine, Ser 10.53 (H) 0.44 - 1.00 mg/dL   Calcium 8.2 (L) 8.9 - 10.3 mg/dL   Phosphorus 4.2 2.5 - 4.6 mg/dL   Albumin 3.9 3.5 - 5.0 g/dL   GFR calc non Af Amer 3 (L) >60 mL/min   GFR calc Af Amer 4 (L) >60 mL/min    Comment: (NOTE) The eGFR has been calculated using the CKD EPI equation. This calculation has not been validated in all clinical situations. eGFR's persistently <60 mL/min signify possible Chronic Kidney Disease.    Anion gap 17 (H) 5 - 15  CBC     Status: Abnormal   Collection Time: 04/29/15 12:54 PM  Result Value Ref Range   WBC 7.3 3.6 - 11.0 K/uL   RBC 2.91 (L) 3.80 - 5.20 MIL/uL   Hemoglobin 10.2 (L) 12.0 - 16.0 g/dL   HCT 30.3 (L) 35.0 - 47.0 %   MCV 104.1 (H) 80.0 - 100.0 fL   MCH 35.2 (H) 26.0 - 34.0 pg   MCHC 33.9 32.0 - 36.0 g/dL   RDW 14.6 (H) 11.5 - 14.5 %   Platelets 309 150 - 440 K/uL    Physical Findings: AIMS:  , ,  ,  ,    CIWA:    COWS:     Treatment Plan Summary: Daily contact with patient to assess and evaluate symptoms and progress in treatment and Medication management   Medical Decision Making:  Established Problem, Stable/Improving (1), New problem, with additional work up planned, Review of Psycho-Social Stressors (1), Independent Review of image, tracing or specimen (2) and Review of Medication Regimen & Side Effects (2)   Ms. Parcher is a 64 year old female with history of depression and multiple medical problems admitted for suicidal ideation in the context of homelessness and treatment noncompliance.  1. Suicidal ideation. The patient is able to contract for safety in the hospital.  2. Mood. She reports that Prozac has been helpful in the past she was started on 20 mg of Prozac.  3. End-stage kidney disease on dialysis. She resumed dialysis under the care of Dr. Holley Raring.   4. HIV. She has not been compliant with her treatment lately. She was restarted on medications by Dr. Johnnye Sima.   5. COPD and hypertension. Followed by  medicine. Will continue inhalers. On admission her blood pressure was 151/131.   6. Smoking. Nicotine products aren't available.  7. Substance abuse. She reports she relapsed on crack last weekend after several months of sobriety.  8. Social. She is homeless but has resources to be placed in assisted living facility. She would like to live in [place] where her son now resides  72. Disposition. To be established.     Mcdaniel Ohms 04/29/2015, 4:45 PM

## 2015-04-29 NOTE — Progress Notes (Signed)
HD START 

## 2015-04-29 NOTE — Progress Notes (Signed)
Recreation Therapy Notes  Date: 07.21.16 Time: 3:00 pm Location: Craft Room  Group Topic: Coping Skills and Leisure Education  Goal Area(s) Addresses:  Patient will identify things they are grateful for. Patient will identify how being grateful can influence decision making.  Behavioral Response: Did not attend  Intervention: Grateful Wheel  Activity: Patients were given an "I Am Grateful For" worksheet and instructed to listed at least one thing they are grateful for under each category.  Education: LRT educated patients on how being aware of what they are grateful for affects their decision making skills.  Education Outcome: Patient did not attend group.  Clinical Observations/Feedback: Patient did not attend group.  Jacquelynn Cree, LRT/CTRS 04/29/2015 4:15 PM

## 2015-04-29 NOTE — Tx Team (Signed)
Interdisciplinary Treatment Plan Update (Adult)  Date:  04/29/2015 Time Reviewed:  4:31 PM  Progress in Treatment: Attending groups: Yes. Participating in groups:  Yes. Taking medication as prescribed:  Yes. Tolerating medication:  Yes. Family/Significant othe contact made:  No, will contact:    Patient understands diagnosis:  Yes. Discussing patient identified problems/goals with staff:  Yes. Medical problems stabilized or resolved:  No. Denies suicidal/homicidal ideation: Yes. Issues/concerns per patient self-inventory:  No. Other:  New problem(s) identified: Yes, Describe:  homeless and needs community outpatient dialysis  Discharge Plan or Barriers: Homeless and resources depleted.  Reason for Continuation of Hospitalization: Depression, to plan a safe discharge that addresses her medical needs as well  Comments:   Estimated length of stay: 7 days  New goal(s): safe plan  Review of initial/current patient goals per problem list:   Refer to plan of care  Attendees: Patient:  [name] 7/21/20164:31 PM  Family:   7/21/20164:31 PM   Physician:  Dr  Jennet Maduro  Nursing:   Jerry Caras RN  Case Manager:  Arrie Senate LCSW  Counselor:  Charleston Ropes LCSWA  Other:  Princella Ion LRT   7/21/20164:31 PM   7/21/20164:31 PM   7/21/20164:31 PM  Counselor:   7/21/20164:31 PM  Other:   7/21/20164:31 PM  Other:   7/21/20164:31 PM  Other:   7/21/20164:31 PM  Other:  7/21/20164:31 PM  Other:  7/21/20164:31 PM  Other:  7/21/20164:31 PM  Other:  7/21/20164:31 PM  Other:  7/21/20164:31 PM  Other:  7/21/20164:31 PM  Other:   7/21/20164:31 PM   Scribe for Treatment Team:   Cheron Schaumann, 04/29/2015, 4:31 PM
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
## 2015-04-29 NOTE — BHH Group Notes (Signed)
[facility] Group Notes:  (Nursing/MHT/Case Management/Adjunct)  Date:  04/29/2015  Time:  4:32 PM  Type of Therapy:  Psychoeducational Skills  Participation Level:  Did Not Attend  Marquette Old 04/29/2015, 4:32 PM

## 2015-04-29 NOTE — Progress Notes (Signed)
PRE HD   

## 2015-04-29 NOTE — Progress Notes (Signed)
Patient has interacted with peers and appeared to enjoy being in the company of others. She went to hemodialysis late this morning and tolerated it well. Denies SI/HI/AVH. Using wheelchair for mobility. Continue to monitor.

## 2015-04-29 NOTE — Telephone Encounter (Signed)
error 

## 2015-04-29 NOTE — B[facility] Group Notes (Signed)
[facility] Group Notes:  (Nursing/MHT/Case Management/Adjunct)  Date:  04/29/2015  Time:  12:29 AM  Type of Therapy:  Group Therapy  Participation Level:  Active  Participation Quality:  Appropriate and Attentive  Affect:  Appropriate  Cognitive:  Alert and Appropriate  Insight:  Appropriate  Engagement in Group:  Engaged  Modes of Intervention:  Discussion  Summary of Progress/Problems:  [name] 04/29/2015, 12:29 AM

## 2015-04-29 NOTE — Progress Notes (Signed)
HD END 

## 2015-04-29 NOTE — Progress Notes (Signed)
POST HD 

## 2015-04-29 NOTE — Consult Note (Signed)
[facility]  Date of Admission:  04/28/2015  Date of Consult:  04/29/2015  Reason for Consult: HIV+, depression Referring Physician: Pucilowska  Impression/Recommendation HIV+ Depression,  Suicidal Ideation Cocaine abuse ESRD HTN CHF  Would Repeat her CD4 and HIV RNA, check genotype Check her other routine labs- lipids, CBC, CMP, RPR, gc/chlamydia if not yet done Would restart her ART (change isentress to tivicay) but her adherence and lack of follow strongly suggests that the utility of this will be short lived. It may in fact be leading to viral resistance to these medications and making her worse off.  Consider having her seen by a "bridge counselor" from [facility] (I have contacted them)  Johny Sax (pager) [phone] [email or web address]  [name] is an 63 y.o. female.  HPI: 64 yo F with HIV+ since ~ 1999. She was previously followed at [facility] and then had her first visit at [facility] 05-13-14. She was off her medications for several months, previously was on Isentress, Epivir, and viread. Her meds were resumed at that visit. She was undetectable and had a good CD4 count at f/u labs.  It does not appear she made any f/u appts in our clinic since then. She also has had difficulty with outpt HD as she has had behavioral issues at several centers.   She is admitted now with depression with suicidal ideation.   Past Medical History  Diagnosis Date  . Hypertension   . HIV infection   . Morbid obesity   . ESRD (end stage renal disease) on dialysis 09/30/2013    Started dialysis in [place], [place] around 2009.  ESRD due to HTN vs drug abuse according to pt.  Was on dialysis at [facility] until Feb 2015 when she was admitted to a SNF due to homelessness and drug abuse.  Then changed to [name] on TTS schedule.     . Anginal pain   . CHF (congestive heart failure)   . Shortness of breath   . Pneumonia   . Depression    . Anxiety   . GERD (gastroesophageal reflux disease)   . Arthritis   . Anemia   . Hyperkalemia 08/11/2014  . Homeless   . CHF (congestive heart failure)     Past Surgical History  Procedure Laterality Date  . Arteriovenous graft placement      left forearm  . Cardiac catheterization    . Laparotomy      states seh was cut open because seh was bleeding on the inside     Allergies  Allergen Reactions  . Trazodone And Nefazodone Other (See Comments)    "Gives me bad dreams"  . Morphine And Related Hives and Rash    Medications:  Scheduled: . amLODipine  5 mg Oral Daily  . b complex-vitamin c-folic acid  1 tablet Oral Daily  . colchicine  0.6 mg Oral Daily  . feeding supplement (NEPRO CARB STEADY)  237 mL Oral q morning - 10a  . FLUoxetine  20 mg Oral Daily  . hydrALAZINE  25 mg Oral 3 times per day  . lamivudine  25 mg Oral Daily  . pantoprazole  40 mg Oral Daily  . raltegravir  400 mg Oral BID  . sevelamer carbonate  2,400 mg Oral TID WC  . [START ON 04/30/2015] tenofovir  300 mg Oral Weekly  . tiotropium  18 mcg Inhalation Daily    Abtx:  Anti-infectives    Start  Dose/Rate Route Frequency Ordered Stop   04/30/15 1000  tenofovir (VIREAD) tablet 300 mg     300 mg Oral Weekly 04/28/15 1929     04/28/15 2200  raltegravir (ISENTRESS) tablet 400 mg     400 mg Oral 2 times daily 04/28/15 1929     04/28/15 1930  lamivudine (EPIVIR-HBV) 5 MG/ML solution 25 mg     25 mg Oral Daily 04/28/15 1929        Total days of antibiotics 0          Social History:  reports that she has been smoking Cigarettes.  She started smoking about a year ago. She has been smoking about 0.50 packs per day. She has never used smokeless tobacco. She reports that she drinks about 0.6 oz of alcohol per week. She reports that she uses illicit drugs (Marijuana and "Crack" cocaine) about once per week.  Family History  Problem Relation Age of Onset  . Kidney failure Other     niece  . High  blood pressure    . Lung cancer    . Breast cancer Neg Hx   . Colon cancer Neg Hx   . Stroke Mother   . HIV/AIDS Brother     died of AIDS    General ROS: e-visit  Blood pressure 147/89, pulse 96, temperature 98 F (36.7 C), temperature source Oral, resp. rate 20, height 5' 3.78" (1.62 m), weight 114.306 kg (252 lb), SpO2 98 %. e-visit   No results found for this or any previous visit (from the past 48 hour(s)). No results found for: SDES, SPECREQUEST, CULT, REPTSTATUS No results found. No results found for this or any previous visit (from the past 240 hour(s)).  HIV 1 RNA QUANT (copies/mL)  Date Value  08/12/2014 <20  06/29/2014 <20  10/24/2013 <20   CD4 T CELL ABS (/uL)  Date Value  08/12/2014 1360  06/29/2014 1070  04/30/2014 1010      04/29/2015, 10:39 AM     LOS: 1 day

## 2015-04-30 LAB — LIPID PANEL
CHOLESTEROL: 157 mg/dL (ref 0–200)
HDL: 32 mg/dL — ABNORMAL LOW (ref >40)
LDL Cholesterol: 80 mg/dL (ref 0–99)
Total CHOL/HDL Ratio: 4.9 RATIO
Triglycerides: 225 mg/dL — ABNORMAL HIGH (ref <150)
VLDL: 45 mg/dL — ABNORMAL HIGH (ref 0–40)

## 2015-04-30 LAB — HEPATITIS B SURFACE ANTIBODY, QUANTITATIVE

## 2015-04-30 LAB — HEPATITIS B SURFACE ANTIGEN: HEP B S AG: NEGATIVE

## 2015-04-30 LAB — PARATHYROID HORMONE, INTACT (NO CA): PTH: 328 pg/mL — AB (ref 15–65)

## 2015-04-30 NOTE — Progress Notes (Signed)
Recreation Therapy Notes  Date: 07.22.16 Time: 3:00 pm Location: Craft Room  Group Topic: Mandalas  Goal Area(s) Addresses:  Patients will learn a healthy coping skill. Patients will verbalized benefit of coloring.  Behavioral Response: Attentive  Intervention: [place]  Activity: Patients colored a mandala to learn a new coping skill.  Education: LRT educated patients on the benefits of coloring.   Education Outcome: Acknowledges education/In group clarification offered  Clinical Observations/Feedback: Patient colored mandala. Patient did not contribute to group discussion.  Jacquelynn Cree, LRT/CTRS 04/30/2015 4:21 PM

## 2015-04-30 NOTE — Plan of Care (Signed)
Problem: Ineffective individual coping Goal: STG: Patient will remain free from self harm Outcome: Progressing Pt has not harmed self or others since admission

## 2015-04-30 NOTE — Progress Notes (Signed)
[facility] MD Progress Note  04/30/2015 3:04 PM Richardine Peppers  MRN:  [id number]  Subjective: Ms. Arteaga is a 64 year old female with a history of depression, substance use, and multiple medical problems including end-stage renal disease on dialysis and HIV infection admitted for suicidal ideation in the context of severe social stressors including treatment noncompliance and homelessness.  Today but the patient reports much improvement. She feels physically better. She had dialysis yesterday and for one's was satisfied with the services provided. She slept well with Benadryl. Her mood is improving, affect is brighter. Her appetite is good and she has been able to participate in groups. She easily engages with staff and peers. She did see multiple consultants over the past 2 days and their input is greatly appreciated. The patient now goes to dialysis 3 times a week. She was restarted on HIV medications. She met with the Maudie Mercury, dialysis coordinator in the community who is trying to help her find assisted living facility as well as dialysis facility.  Principal Problem: Major depressive disorder, recurrent, severe without psychotic features Diagnosis:   Patient Active Problem List   Diagnosis Date Noted  . Tobacco use disorder [Z72.0] 04/28/2015  . Cocaine use disorder, moderate, dependence [F14.20] 04/28/2015  . Uremia [N19] 03/12/2015  . Major depressive disorder, recurrent, severe without psychotic features [F33.2] 03/12/2015  . COPD (chronic obstructive pulmonary disease) [J44.9] 03/08/2015  . CKD (chronic kidney disease) requiring chronic dialysis [N18.6, Z99.2] 03/08/2015  . ESRD needing dialysis [N18.6] 02/26/2015  . Vaginal discharge [N89.8]   . Essential hypertension [I10]   . Chronic combined systolic and diastolic congestive heart failure [I50.42]   . Macrocytic anemia [D53.9]   . Chest pain [R07.9]   . HIV disease [B20]   . ESRD on dialysis [N18.6, Z99.2]   . Hyperkalemia [E87.5]  07/12/2014  . Screening examination for venereal disease [Z11.3] 05/13/2014  . Encounter for long-term (current) use of other medications [Z79.899] 05/13/2014  . Respiratory failure [J96.90] 05/08/2014  . Dyspnea [R06.00] 04/21/2014  . Pulmonary edema [J81.1] 04/21/2014  . Crack cocaine use [F14.90] 01/17/2014  . Osteoarthritis of right hip [M16.11] 12/17/2013  . CCF (congestive cardiac failure) [I50.9] 10/30/2013  . Extreme obesity [E66.01] 10/23/2013  . ESRD (end stage renal disease) on dialysis [N18.6, Z99.2] 09/30/2013  . Anemia secondary to renal failure [D63.1] 02/04/2013  . Compulsive tobacco user syndrome [F17.200] 01/30/2013  . Hyperparathyroidism due to renal insufficiency [N25.81] 01/30/2013  . Anxiety state [F41.1] 01/30/2013  . HIV infection [Z21]   . Hypertension [I10]   . 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 Hypertension   . HIV infection   . Morbid obesity   . ESRD (end stage renal disease) on dialysis 09/30/2013    Started dialysis in [place], [place] around 2009.  ESRD due to HTN vs drug abuse according to pt.  Was on dialysis at [facility] until Feb 2015 when she was admitted to a SNF due to homelessness and drug abuse.  Then changed to [facility] on TTS schedule.     . Anginal pain   . CHF (congestive heart failure)   . Shortness of breath   . Pneumonia   . Depression   . Anxiety   . GERD (gastroesophageal reflux disease)   . Arthritis   . Anemia   . Hyperkalemia 08/11/2014  . Homeless   .  CHF (congestive heart failure)     Past Surgical History  Procedure Laterality Date  . Arteriovenous graft placement      left forearm  . Cardiac catheterization    . Laparotomy      states seh was cut open because seh was bleeding on the inside   Family History:  Family History  Problem Relation Age of Onset  . Kidney  failure Other     niece  . High blood pressure    . Lung cancer    . Breast cancer Neg Hx   . Colon cancer Neg Hx   . Stroke Mother   . HIV/AIDS Brother     died of AIDS   Social History:  History  Alcohol Use  . 0.6 oz/week  . 1 Cans of beer per week     History  Drug Use  . 1.00 per week  . Special: Marijuana, "Crack" cocaine    Comment: crack states quit 10/03/13    History   Social History  . Marital Status: Single    Spouse Name: N/A  . Number of Children: N/A  . Years of Education: N/A   Social History Main Topics  . Smoking status: Current Every Day Smoker -- 0.50 packs/day    Types: Cigarettes    Start date: 05/06/2014  . Smokeless tobacco: Never Used     Comment: recently quit  . Alcohol Use: 0.6 oz/week    1 Cans of beer per week  . Drug Use: 1.00 per week    Special: Marijuana, "Crack" cocaine     Comment: crack states quit 10/03/13  . Sexual Activity: No   Other Topics Concern  . None   Social History Narrative   Was recently in [place] and she left without allowing them to arrange for outpatient hemodialysis.  They also were not able to arrange for home health services.        [name] is whom she moved in with currently.  States her niece may be able to help her get to hemodialysis.     Additional History:    Sleep: Good  Appetite:  Good   Assessment:   Musculoskeletal: Strength & Muscle Tone: within normal limits Gait & Station: normal Patient leans: N/A   Psychiatric Specialty Exam: Physical Exam  Nursing note and vitals reviewed.   Review of Systems  All other systems reviewed and are negative.   Blood pressure 149/85, pulse 92, temperature 98.4 F (36.9 C), temperature source Oral, resp. rate 20, height 5' 3.78" (1.62 m), weight 114.306 kg (252 lb), SpO2 98 %.Body mass index is 43.56 kg/(m^2).  General Appearance: Casual  Eye Contact::  Good  Speech:  Clear and Coherent  Volume:  Normal  Mood:  Anxious   Affect:  Flat  Thought Process:  Goal Directed  Orientation:  Full (Time, Place, and Person)  Thought Content:  WDL  Suicidal Thoughts:  Yes.  without intent/plan  Homicidal Thoughts:  No  Memory:  Immediate;   Fair Recent;   Fair Remote;   Fair  Judgement:  Fair  Insight:  Fair  Psychomotor Activity:  Normal  Concentration:  Fair  Recall:  [employer] of Knowledge:Fair  Language: Fair  Akathisia:  No  Handed:  Right  AIMS (if indicated):     Assets:  Communication Skills Desire for Improvement Financial Resources/Insurance  ADL's:  Intact  Cognition: WNL  Sleep:  Number of Hours: 6.75     Current  Medications: Current Facility-Administered Medications  Medication Dose Route Frequency Provider Last Rate Last Dose  . 0.9 %  sodium chloride infusion  100 mL Intravenous PRN Munsoor Lateef, MD      . 0.9 %  sodium chloride infusion  100 mL Intravenous PRN Munsoor Lateef, MD      . acetaminophen (TYLENOL) tablet 650 mg  650 mg Oral Q6H PRN Jermaine Tholl B Lekita Kerekes, MD      . albuterol (PROVENTIL) (2.5 MG/3ML) 0.083% nebulizer solution 3 mL  3 mL Inhalation Q6H PRN Clovis Fredrickson, MD   3 mL at 04/28/15 2235  . alum & mag hydroxide-simeth (MAALOX/MYLANTA) 200-200-20 MG/5ML suspension 30 mL  30 mL Oral Q4H PRN Torey Regan B Calistro Rauf, MD      . amLODipine (NORVASC) tablet 5 mg  5 mg Oral Daily Clovis Fredrickson, MD   5 mg at 04/30/15 0823  . b complex-vitamin c-folic acid (NEPHRO-VITE) tablet 1 tablet  1 tablet Oral Daily Epifanio Lesches, MD   1 tablet at 04/30/15 7412  . colchicine tablet 0.6 mg  0.6 mg Oral Daily Clovis Fredrickson, MD   0.6 mg at 04/30/15 0824  . diphenhydrAMINE (BENADRYL) capsule 50 mg  50 mg Oral QHS Clovis Fredrickson, MD   50 mg at 04/29/15 2146  . dolutegravir (TIVICAY) tablet 50 mg  50 mg Oral Daily Campbell Riches, MD   50 mg at 04/30/15 8786  . feeding supplement (NEPRO CARB STEADY) liquid 237 mL  237 mL Oral q morning - 10a Munsoor Lateef, MD    237 mL at 04/30/15 1033  . FLUoxetine (PROZAC) capsule 20 mg  20 mg Oral Daily Wendi Lastra B Quentavious Rittenhouse, MD   20 mg at 04/30/15 0824  . heparin injection 1,000 Units  1,000 Units Dialysis PRN Munsoor Lateef, MD      . hydrALAZINE (APRESOLINE) tablet 25 mg  25 mg Oral 3 times per day Clovis Fredrickson, MD   25 mg at 04/30/15 1159  . lamiVUDine (EPIVIR) 10 MG/ML solution 25 mg  25 mg Oral Daily Stela Iwasaki B Ab Leaming, MD   25 mg at 04/30/15 1033  . lidocaine (PF) (XYLOCAINE) 1 % injection 5 mL  5 mL Intradermal PRN Munsoor Lateef, MD      . lidocaine-prilocaine (EMLA) cream 1 application  1 application Topical PRN Munsoor Lateef, MD      . magnesium hydroxide (MILK OF MAGNESIA) suspension 30 mL  30 mL Oral Daily PRN Janeah Kovacich B Joselyne Spake, MD      . nicotine (NICOTROL) 10 MG inhaler 1 continuous puffing  1 continuous puffing Inhalation PRN Clovis Fredrickson, MD   1 continuous puffing at 04/28/15 2011  . pantoprazole (PROTONIX) EC tablet 40 mg  40 mg Oral Daily Kallista Pae B Arlee Santosuosso, MD   40 mg at 04/30/15 1033  . pentafluoroprop-tetrafluoroeth (GEBAUERS) aerosol 1 application  1 application Topical PRN Munsoor Lateef, MD      . sevelamer carbonate (RENVELA) tablet 2,400 mg  2,400 mg Oral TID WC Epifanio Lesches, MD   2,400 mg at 04/30/15 1157  . tenofovir (VIREAD) tablet 300 mg  300 mg Oral Weekly Epifanio Lesches, MD   300 mg at 04/30/15 1033  . tiotropium (SPIRIVA) inhalation capsule 18 mcg  18 mcg Inhalation Daily Clovis Fredrickson, MD   18 mcg at 04/30/15 7672   Facility-Administered Medications Ordered in Other Encounters  Medication Dose Route Frequency Provider Last Rate Last Dose  . 0.9 %  sodium chloride infusion  100  mL Intravenous PRN Shelle Iron, NP      . 0.9 %  sodium chloride infusion  100 mL Intravenous PRN Shelle Iron, NP      . heparin injection 2,300 Units  20 Units/kg Dialysis Once in dialysis Shelle Iron, NP        Lab Results:  Results for orders placed or  performed during the hospital encounter of 04/28/15 (from the past 48 hour(s))  Renal function panel     Status: Abnormal   Collection Time: 04/29/15 12:54 PM  Result Value Ref Range   Sodium 136 135 - 145 mmol/L   Potassium 4.3 3.5 - 5.1 mmol/L   Chloride 94 (L) 101 - 111 mmol/L   CO2 25 22 - 32 mmol/L   Glucose, Bld 135 (H) 65 - 99 mg/dL   BUN 42 (H) 6 - 20 mg/dL   Creatinine, Ser 10.53 (H) 0.44 - 1.00 mg/dL   Calcium 8.2 (L) 8.9 - 10.3 mg/dL   Phosphorus 4.2 2.5 - 4.6 mg/dL   Albumin 3.9 3.5 - 5.0 g/dL   GFR calc non Af Amer 3 (L) >60 mL/min   GFR calc Af Amer 4 (L) >60 mL/min    Comment: (NOTE) The eGFR has been calculated using the CKD EPI equation. This calculation has not been validated in all clinical situations. eGFR's persistently <60 mL/min signify possible Chronic Kidney Disease.    Anion gap 17 (H) 5 - 15  CBC     Status: Abnormal   Collection Time: 04/29/15 12:54 PM  Result Value Ref Range   WBC 7.3 3.6 - 11.0 K/uL   RBC 2.91 (L) 3.80 - 5.20 MIL/uL   Hemoglobin 10.2 (L) 12.0 - 16.0 g/dL   HCT 30.3 (L) 35.0 - 47.0 %   MCV 104.1 (H) 80.0 - 100.0 fL   MCH 35.2 (H) 26.0 - 34.0 pg   MCHC 33.9 32.0 - 36.0 g/dL   RDW 14.6 (H) 11.5 - 14.5 %   Platelets 309 150 - 440 K/uL  Parathyroid hormone, intact (no Ca)     Status: Abnormal   Collection Time: 04/29/15 12:54 PM  Result Value Ref Range   PTH 328 (H) 15 - 65 pg/mL    Comment: (NOTE) Performed At: [facility] [place], [place] [id number] Lindon Romp MD [id number]   Hepatitis B surface antigen     Status: None   Collection Time: 04/29/15 12:54 PM  Result Value Ref Range   Hepatitis B Surface Ag Negative Negative    Comment: (NOTE) Performed At: [facility] [place], [place] [id number] Lindon Romp MD [id number]   Hepatitis B surface antibody     Status: Abnormal   Collection Time: 04/29/15 12:54 PM  Result Value Ref Range   Hepatitis B-Post <3.1  (L) Immunity>9.9 mIU/mL    Comment: (NOTE)  Status of Immunity                     Anti-HBs Level  ------------------                     -------------- Inconsistent with Immunity                   0.0 - 9.9 Consistent with Immunity                          >9.9 Performed At: [facility] [address]  [address] [place], [place] [id number] Lindon Romp MD [id number]   Lipid panel     Status: Abnormal   Collection Time: 04/30/15  2:05 PM  Result Value Ref Range   Cholesterol 157 0 - 200 mg/dL   Triglycerides 225 (H) <150 mg/dL   HDL 32 (L) >40 mg/dL   Total CHOL/HDL Ratio 4.9 RATIO   VLDL 45 (H) 0 - 40 mg/dL   LDL Cholesterol 80 0 - 99 mg/dL    Comment:        Total Cholesterol/HDL:CHD Risk Coronary Heart Disease Risk Table                     Men   Women  1/2 Average Risk   3.4   3.3  Average Risk       5.0   4.4  2 X Average Risk   9.6   7.1  3 X Average Risk  23.4   11.0        Use the calculated Patient Ratio above and the CHD Risk Table to determine the patient's CHD Risk.        ATP III CLASSIFICATION (LDL):  <100     mg/dL   Optimal  100-129  mg/dL   Near or Above                    Optimal  130-159  mg/dL   Borderline  160-189  mg/dL   High  >190     mg/dL   Very High     Physical Findings: AIMS:  , ,  ,  ,    CIWA:    COWS:     Treatment Plan Summary: Daily contact with patient to assess and evaluate symptoms and progress in treatment and Medication management   Medical Decision Making:  Established Problem, Stable/Improving (1), Review of Psycho-Social Stressors (1), Review or order clinical lab tests (1), Review of Medication Regimen & Side Effects (2) and Review of New Medication or Change in Dosage (2)   Ms. Ulrich is a 64 year old female with history of depression and multiple medical problems admitted for suicidal ideation in the context of homelessness and treatment noncompliance.  1. Suicidal ideation. The patient is able to contract for  safety in the hospital.  2. Mood. She reports that Prozac has been helpful in the past she was started on 20 mg of Prozac.  3. End-stage kidney disease on dialysis. She resumed dialysis under the care of Dr. Holley Raring.   4. HIV. She has not been compliant with her treatment lately. She was restarted on medications by Dr. Johnnye Sima.   5. COPD and hypertension. Followed by medicine. Will continue inhalers. On admission her blood pressure was 151/131.   6. Smoking. Nicotine products aren't available.  7. Substance abuse. She reports she relapsed on crack last weekend after several months of sobriety.  8. Social. She is homeless but has resources to be placed in assisted living facility. She would like to live in [place] where her son now resides  37. Insomnia. Benadryl 50 mg nightly.   10. Disposition. To be established.     Anan Dapolito 04/30/2015, 3:04 PM

## 2015-04-30 NOTE — BHH Group Notes (Signed)
[facility] LCSW Aftercare Discharge Planning Group Note   04/30/2015 3:56 PM  Participation Quality:  Active   Mood/Affect:  Appropriate  Depression Rating:  7  Anxiety Rating:  4  Thoughts of Suicide:  No Will you contract for safety?   NA  Current AVH:  No  Plan for Discharge/Comments:  Pt is interested in an ALF. She states she wants to make changes in her life so she can be healthier. Her goal for today was to talk to her son.   Transportation Means: Family   Supports:Family   [name] [employer] MSW, [address]

## 2015-04-30 NOTE — BHH Group Notes (Signed)
[facility] Group Notes:  (Nursing/MHT/Case Management/Adjunct)  Date:  04/30/2015  Time:  11:36 AM  Type of Therapy:  Group Therapy  Participation Level:  Minimal  Participation Quality:  Attentive  Affect:  Resistant  Cognitive:  Alert, Appropriate and Oriented  Insight:  Appropriate  Engagement in Group:  Limited  Modes of Intervention:  Activity  Summary of Progress/Problems:  Render [name] 04/30/2015, 11:36 AM

## 2015-04-30 NOTE — BHH Group Notes (Signed)
[facility] Group Notes:  (Nursing/MHT/Case Management/Adjunct)  Date:  04/30/2015  Time:  5:57 AM  Type of Therapy:  Group Therapy  Participation Level:  Active  Participation Quality:  Appropriate  Affect:  Appropriate  Cognitive:  Appropriate  Insight:  Good  Engagement in Group:  Engaged  Modes of Intervention:  n/a  Summary of Progress/Problems:  Veva Holes 04/30/2015, 5:57 AM

## 2015-04-30 NOTE — Progress Notes (Signed)
Initial Nutrition Assessment   INTERVENTION:   Meals and Snacks: encourage menu completion to best meet pt preferences Medical Food Supplement Therapy: continue Nepro daily  NUTRITION DIAGNOSIS:   No nutrition diagnosis at this time  GOAL:   Patient will meet greater than or equal to 90% of their needs   MONITOR:    (Energy Intake, Anthropometrics, Digestive System, Electrolyte/Renal Profile)  REASON FOR ASSESSMENT:    (Dialysis Patient)    ASSESSMENT:    Pt admitted with major depressive disorder, recurrent, severe without psychotic features; suicidial ideation; dialysis patient   Past Medical History  Diagnosis Date  . Hypertension   . HIV infection   . Morbid obesity   . ESRD (end stage renal disease) on dialysis 09/30/2013    Started dialysis in [place], [place] around 2009.  ESRD due to HTN vs drug abuse according to pt.  Was on dialysis at [facility] until Feb 2015 when she was admitted to a SNF due to homelessness and drug abuse.  Then changed to [facility] on TTS schedule.     . Anginal pain   . CHF (congestive heart failure)   . Shortness of breath   . Pneumonia   . Depression   . Anxiety   . GERD (gastroesophageal reflux disease)   . Arthritis   . Anemia   . Hyperkalemia 08/11/2014  . Homeless   . CHF (congestive heart failure)      Diet Order:  Diet renal with fluid restriction Fluid restriction:: 1200 mL Fluid; Room service appropriate?: Yes; Fluid consistency:: Thin   Energy Intake: recorded po intake 74% of meals, also receiving Nepro daily  Electrolyte and Renal Profile:  Recent Labs Lab 04/24/15 2112 04/27/15 0751 04/29/15 1254  BUN 25* 51* 42*  CREATININE 8.13* 13.01* 10.53*  NA 140 136 136  K 4.3 4.2 4.3  PHOS  --  4.3 4.2   Glucose Profile: No results for input(s): GLUCAP in the last 72 hours. Protein Profile:  Recent Labs Lab 04/27/15 0751 04/29/15 1254  ALBUMIN 3.2* 3.9   Meds: nepro-vite, renvela  Height:   Ht  Readings from Last 1 Encounters:  04/28/15 5' 3.78" (1.62 m)    Weight:   Wt Readings from Last 1 Encounters:  04/28/15 252 lb (114.306 kg)    Filed Weights   04/28/15 1615 04/28/15 1700  Weight: 252 lb (114.306 kg) 252 lb (114.306 kg)    Wt Readings from Last 10 Encounters:  04/28/15 252 lb (114.306 kg)  04/28/15 254 lb (115.214 kg)  04/10/15 249 lb 5.4 oz (113.1 kg)  03/12/15 254 lb (115.214 kg)  03/11/15 254 lb (115.214 kg)  03/09/15 259 lb 7.7 oz (117.7 kg)  03/05/15 254 lb (115.214 kg)  03/01/15 254 lb (115.214 kg)  02/24/15 254 lb (115.214 kg)  02/19/15 235 lb 14.3 oz (107 kg)    BMI:  Body mass index is 43.56 kg/(m^2).  LOW Care Level  Romelle Starcher MS, [place], LDN [phone] Pager

## 2015-04-30 NOTE — BHH Group Notes (Signed)
[facility] LCSW Group Therapy  04/30/2015 3:33 PM  Type of Therapy:  Group Therapy  Participation Level:  Did Not Attend    Lulu Riding, MSW, LCSWA 04/30/2015, 3:33 PM

## 2015-04-30 NOTE — Care Management Note (Signed)
I met with patient while on dialysis yesterday 04/29/15.  Patient agreed to referral for outpatient dialysis placement in the [place] area.  Patient has not been allowed to treat at [facility] clinics in the [place] area for the past couple of years.  She has been admitted to [facility] for her dialysis needs for some time now.  Since she has been denied admission to [facility] I have sent a referral to [facility].  I anticipate a response first of next week.   Iran Sizer  Dialysis Liaison [facility]  [phone]

## 2015-04-30 NOTE — Progress Notes (Signed)
Discussed w/ Dr. Jennet Maduro and will sign off patient for now. Please call back if any further help needed.

## 2015-04-30 NOTE — Progress Notes (Signed)
Recreation Therapy Notes  INPATIENT RECREATION THERAPY ASSESSMENT  Patient Details Name: Molina Hollenback MRN: [id number] DOB: Nov 03, 1950 Today's Date: 04/30/2015  Patient Stressors: Family, Death, Friends, Other (Comment) (Lack of friends, no home)  Coping Skills:   Isolate, Substance Abuse, Exercise, Art/Dance, Talking, Music, Sports, Other (Comment) (Think of good things, relax, talk to God)  Personal Challenges: Anger, Communication, Concentration, Problem-Solving, Relationships, Self-Esteem/Confidence  Leisure Interests (2+):  Individual - TV, Games - Cards  Awareness of Community Resources:  Yes  Community Resources:  [place], [place]  Current Use: No  If no, Barriers?: Other (Comment) ("I just haven't been.")  Patient Strengths:  "Me for being me", nice  Patient Identified Areas of Improvement:  Going to the grocery store and getting her own groceries. 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 Find a new place and be a different woman.  City of Residence:  [place] of Residence:  [place]   Current [place] (including self-harm):  No  Current HI:  No  Consent to Intern Participation: N/A   Jacquelynn Cree, LRT/CTRS 04/30/2015, 1:24 PM

## 2015-04-30 NOTE — BHH Group Notes (Signed)
[facility] LCSW Group Therapy (Late entry for 04/29/2015)  04/30/2015 3:52 PM  Type of Therapy:  Group Therapy  Participation Level:  Active  Participation Quality:  Appropriate  Affect:  Appropriate  Cognitive:  Alert  Insight:  Improving  Engagement in Therapy:  Improving  Modes of Intervention:  Discussion, Education, Socialization and Support  Summary of Progress/Problems: Emotional Regulation: Patients will identify both negative and positive emotions. They will discuss emotions they have difficulty regulating and how they impact their lives. Patients will be asked to identify healthy coping skills to combat unhealthy reactions to negative emotions.   [name] discussed how her emotions effect her decision making. She expressed when she cannot regulate her emotions she tends to use drugs.   [employer] MSW, LCSWA  04/30/2015, 3:52 PM

## 2015-04-30 NOTE — Progress Notes (Signed)
D: Pt is awake and active in the milieu this evening. Pt mood is depressed and her affect is sad. Pt behavior is appropriate and she is pleasant and cooperative with staff and peers. Pt is preoccupied with speaking to her son. Staff called multiple times and left a voice message for him to call.   A: Writer provided emotional support and administered medications as prescribed.   R: Pt had minimal interaction but was present in the milieu. She went to bed shortly after evening snack.

## 2015-04-30 NOTE — Progress Notes (Signed)
[facility]  ROUNDING NOTE   Subjective:  Pt had HD yesterday. Tolerated well. Due for HD again tomorrow. Case workers searching for an outpt HD spot in [place].     Objective:  Vital signs in last 24 hours:  Temp:  [98.4 F (36.9 C)] 98.4 F (36.9 C) (07/22 0547) Pulse Rate:  [90-122] 92 (07/22 0547) Resp:  [16-20] 20 (07/22 0547) BP: (116-149)/(61-95) 149/85 mmHg (07/22 0547)  Weight change:  Filed Weights   04/28/15 1615 04/28/15 1700  Weight: 114.306 kg (252 lb) 114.306 kg (252 lb)    Intake/Output: I/O last 3 completed shifts: In: 240 [P.O.:240] Out: 1500 [Other:1500]   Intake/Output this shift:  Total I/O In: 360 [P.O.:360] Out: -   Physical Exam: General: NAD, sitting in wheel chair  Head: Normocephalic, atraumatic. Moist oral mucosal membranes  Eyes: Anicteric  Neck: Supple, trachea midline  Lungs:  Clear to auscultation normal effort  Heart: Regular rate and rhythm no rubs  Abdomen:  Soft, nontender, BS present  Extremities:  1+ peripheral edema.  Neurologic: Nonfocal, moving all four extremities  Skin: No lesions  Access: LUE AVF    Basic Metabolic Panel:  Recent Labs Lab 04/24/15 2112 04/27/15 0751 04/29/15 1254  NA 140 136 136  K 4.3 4.2 4.3  CL 100* 98* 94*  CO2 GLUCOSE 96 90 135*  BUN 25* 51* 42*  CREATININE 8.13* 13.01* 10.53*  CALCIUM 7.8* 7.6* 8.2*  PHOS  --  4.3 4.2    Liver Function Tests:  Recent Labs Lab 04/27/15 0751 04/29/15 1254  ALBUMIN 3.2* 3.9   No results for input(s): LIPASE, AMYLASE in the last 168 hours. No results for input(s): AMMONIA in the last 168 hours.  CBC:  Recent Labs Lab 04/24/15 2112 04/27/15 0751 04/29/15 1254  WBC 6.4 8.3 7.3  HGB 10.2* 9.0* 10.2*  HCT 32.3* 28.3* 30.3*  MCV 110.6* 106.8* 104.1*  PLT 390 318 309    Cardiac Enzymes:  Recent Labs Lab 04/27/15 0034 04/27/15 0653  TROPONINI <0.03 <0.03    BNP: Invalid input(s): POCBNP  CBG: No  results for input(s): GLUCAP in the last 168 hours.  Microbiology: Results for orders placed or performed during the hospital encounter of 03/08/15  MRSA PCR Screening     Status: None   Collection Time: 03/09/15  2:16 AM  Result Value Ref Range Status   MRSA by PCR NEGATIVE NEGATIVE Final    Comment:        The GeneXpert MRSA Assay (FDA approved for NASAL specimens only), is one component of a comprehensive MRSA colonization surveillance program. It is not intended to diagnose MRSA infection nor to guide or monitor treatment for MRSA infections.     Coagulation Studies: No results for input(s): LABPROT, INR in the last 72 hours.  Urinalysis: No results for input(s): COLORURINE, LABSPEC, PHURINE, GLUCOSEU, HGBUR, BILIRUBINUR, KETONESUR, PROTEINUR, UROBILINOGEN, NITRITE, LEUKOCYTESUR in the last 72 hours.  Invalid input(s): APPERANCEUR    Imaging: No results found.   Medications:     . amLODipine  5 mg Oral Daily  . b complex-vitamin c-folic acid  1 tablet Oral Daily  . colchicine  0.6 mg Oral Daily  . diphenhydrAMINE  50 mg Oral QHS  . dolutegravir  50 mg Oral Daily  . feeding supplement (NEPRO CARB STEADY)  237 mL Oral q morning - 10a  . FLUoxetine  20 mg Oral Daily  . hydrALAZINE  25 mg Oral 3 times per  day  . lamiVUDine  25 mg Oral Daily  . pantoprazole  40 mg Oral Daily  . sevelamer carbonate  2,400 mg Oral TID WC  . tenofovir  300 mg Oral Weekly  . tiotropium  18 mcg Inhalation Daily   sodium chloride, sodium chloride, acetaminophen, albuterol, alum & mag hydroxide-simeth, heparin, lidocaine (PF), lidocaine-prilocaine, magnesium hydroxide, nicotine, pentafluoroprop-tetrafluoroeth  Assessment/ Plan:  64 y.o. female with pmhx of ESRD on HD (not at an established dialysis center, had an issue at [facility], has been going to ED for dialysis at [facility] and [facility]), HIV, substance abuse, anemia of CKD, SHPTH, dperession, COPD, hypertension, LUE AVF.  1.  ESRD  on HD:  Pt doesn't have an established dialysis center, released two years ago from [facility] from an [facility] clinic.  Has been going to ED to obtain dialysis at [facility] and [facility].   -Had HD yesterday, will plan for HD again tomorrow, no acute indication today.  Care managers searching for a outpt dialysis    2.  Anemia of CKD:  Continue epogen 10000 units IV with HD.   3.  SHPTH:  Continue renvela, check ipth/phos with HD today.  4.  Hypertension:  BP 149/85, continue amlodipine, hydralazine.  5.  Depression/suicidal ideation: Much of this appears situational, on fluoxetine, psych following.   LOS: 2 Pharrah Rottman 7/22/20163:34 PM

## 2015-04-30 NOTE — Progress Notes (Signed)
Pt has been alert and oriented today.  She states that she is still having some depression. Denies Suicidal thoughts.  Flat affect. Patient has asked several times to talk with her son and assisted with telephone call.  Patient continues to use the wheelchair to get around the unit.  No distress noted.  No signs of AH or VH.  Will cont to monitor for safety.

## 2015-05-01 LAB — RENAL FUNCTION PANEL
ALBUMIN: 3.5 g/dL (ref 3.5–5.0)
ANION GAP: 13 (ref 5–15)
BUN: 33 mg/dL — ABNORMAL HIGH (ref 6–20)
CHLORIDE: 95 mmol/L — AB (ref 101–111)
CO2: 27 mmol/L (ref 22–32)
CREATININE: 8.62 mg/dL — AB (ref 0.44–1.00)
Calcium: 8.1 mg/dL — ABNORMAL LOW (ref 8.9–10.3)
GFR calc Af Amer: 5 mL/min — ABNORMAL LOW (ref >60)
GFR calc non Af Amer: 4 mL/min — ABNORMAL LOW (ref >60)
Glucose, Bld: 113 mg/dL — ABNORMAL HIGH (ref 65–99)
POTASSIUM: 4.6 mmol/L (ref 3.5–5.1)
Phosphorus: 3 mg/dL (ref 2.5–4.6)
Sodium: 135 mmol/L (ref 135–145)

## 2015-05-01 LAB — CBC
HEMATOCRIT: 29.3 % — AB (ref 35.0–47.0)
Hemoglobin: 9.7 g/dL — ABNORMAL LOW (ref 12.0–16.0)
MCH: 34.6 pg — ABNORMAL HIGH (ref 26.0–34.0)
MCHC: 33.1 g/dL (ref 32.0–36.0)
MCV: 104.5 fL — ABNORMAL HIGH (ref 80.0–100.0)
PLATELETS: 268 10*3/uL (ref 150–440)
RBC: 2.8 MIL/uL — ABNORMAL LOW (ref 3.80–5.20)
RDW: 14.2 % (ref 11.5–14.5)
WBC: 7.2 10*3/uL (ref 3.6–11.0)

## 2015-05-01 LAB — PHOSPHORUS: PHOSPHORUS: 3 mg/dL (ref 2.5–4.6)

## 2015-05-01 MED ORDER — AMLODIPINE BESYLATE 10 MG PO TABS
10.0000 mg | ORAL_TABLET | Freq: Every day | ORAL | Status: DC
  Administered 2015-05-02 – 2015-05-06 (×5): 10 mg via ORAL
  Filled 2015-05-01 (×5): qty 1

## 2015-05-01 MED ORDER — TEMAZEPAM 7.5 MG PO CAPS
7.5000 mg | ORAL_CAPSULE | Freq: Every day | ORAL | Status: DC
  Administered 2015-05-01: 7.5 mg via ORAL
  Filled 2015-05-01: qty 1

## 2015-05-01 NOTE — Plan of Care (Signed)
Problem: Ineffective individual coping Goal: LTG: Patient will report a decrease in negative feelings Outcome: Progressing Patient states she feels better and is happy to be getting her dialysis regularly.  Goal: STG: Patient will remain free from self harm Outcome: Progressing No self harm.  Goal: STG-Increase in ability to manage activities of daily living Outcome: Progressing Pt able to complete ADL's independently.

## 2015-05-01 NOTE — Progress Notes (Signed)
[facility]  ROUNDING NOTE   Subjective:  Pt seen during HD. Had cramps earlier in treatment. Resolved now.  Otherwise tolerating HD well.  Objective:  Vital signs in last 24 hours:  Temp:  [98.1 F (36.7 C)-98.4 F (36.9 C)] 98.1 F (36.7 C) (07/23 1030) Pulse Rate:  [99] 99 (07/23 1030) Resp:  [12-33] 18 (07/23 1330) BP: (140-162)/(75-99) 155/80 mmHg (07/23 1330)  Weight change:  Filed Weights   04/28/15 1615 04/28/15 1700  Weight: 114.306 kg (252 lb) 114.306 kg (252 lb)    Intake/Output: I/O last 3 completed shifts: In: 600 [P.O.:600] Out: -    Intake/Output this shift:  Total I/O In: 360 [P.O.:360] Out: -   Physical Exam: General: NAD, sitting up  Head: Normocephalic, atraumatic. Moist oral mucosal membranes  Eyes: Anicteric  Neck: Supple, trachea midline  Lungs:  Clear to auscultation normal effort  Heart: Regular rate and rhythm no rubs  Abdomen:  Soft, nontender, BS present  Extremities:  trace peripheral edema.  Neurologic: Nonfocal, moving all four extremities  Skin: No lesions  Access: LUE AVF    Basic Metabolic Panel:  Recent Labs Lab 04/24/15 2112 04/27/15 0751 04/29/15 1254 05/01/15 1048  NA 140 136 136 135  K 4.3 4.2 4.3 4.6  CL 100* 98* 94* 95*  CO2 25 23 25 27   GLUCOSE 96 90 135* 113*  BUN 25* 51* 42* 33*  CREATININE 8.13* 13.01* 10.53* 8.62*  CALCIUM 7.8* 7.6* 8.2* 8.1*  PHOS  --  4.3 4.2 3.0  3.0    Liver Function Tests:  Recent Labs Lab 04/27/15 0751 04/29/15 1254 05/01/15 1048  ALBUMIN 3.2* 3.9 3.5   No results for input(s): LIPASE, AMYLASE in the last 168 hours. No results for input(s): AMMONIA in the last 168 hours.  CBC:  Recent Labs Lab 04/24/15 2112 04/27/15 0751 04/29/15 1254 05/01/15 1048  WBC 6.4 8.3 7.3 7.2  HGB 10.2* 9.0* 10.2* 9.7*  HCT 32.3* 28.3* 30.3* 29.3*  MCV 110.6* 106.8* 104.1* 104.5*  PLT 390 318 309 268    Cardiac Enzymes:  Recent Labs Lab 04/27/15 0034  04/27/15 0653  TROPONINI <0.03 <0.03    BNP: Invalid input(s): POCBNP  CBG: No results for input(s): GLUCAP in the last 168 hours.  Microbiology: Results for orders placed or performed during the hospital encounter of 03/08/15  MRSA PCR Screening     Status: None   Collection Time: 03/09/15  2:16 AM  Result Value Ref Range Status   MRSA by PCR NEGATIVE NEGATIVE Final    Comment:        The GeneXpert MRSA Assay (FDA approved for NASAL specimens only), is one component of a comprehensive MRSA colonization surveillance program. It is not intended to diagnose MRSA infection nor to guide or monitor treatment for MRSA infections.     Coagulation Studies: No results for input(s): LABPROT, INR in the last 72 hours.  Urinalysis: No results for input(s): COLORURINE, LABSPEC, PHURINE, GLUCOSEU, HGBUR, BILIRUBINUR, KETONESUR, PROTEINUR, UROBILINOGEN, NITRITE, LEUKOCYTESUR in the last 72 hours.  Invalid input(s): APPERANCEUR    Imaging: No results found.   Medications:     . amLODipine  5 mg Oral Daily  . b complex-vitamin c-folic acid  1 tablet Oral Daily  . colchicine  0.6 mg Oral Daily  . diphenhydrAMINE  50 mg Oral QHS  . dolutegravir  50 mg Oral Daily  . feeding supplement (NEPRO CARB STEADY)  237 mL Oral q morning - 10a  . FLUoxetine  20 mg Oral Daily  . hydrALAZINE  25 mg Oral 3 times per day  . lamiVUDine  25 mg Oral Daily  . pantoprazole  40 mg Oral Daily  . sevelamer carbonate  2,400 mg Oral TID WC  . tenofovir  300 mg Oral Weekly  . tiotropium  18 mcg Inhalation Daily   sodium chloride, sodium chloride, acetaminophen, albuterol, alum & mag hydroxide-simeth, heparin, lidocaine (PF), lidocaine-prilocaine, magnesium hydroxide, nicotine, pentafluoroprop-tetrafluoroeth  Assessment/ Plan:  64 y.o. female with Pmhx of ESRD on HD (not at an established dialysis center, had an issue at [facility], has been going to ED for dialysis at [facility] and [facility]), HIV,  substance abuse, anemia of CKD, SHPTH, dperession, COPD, hypertension, LUE AVF.  1.  ESRD on HD:  Pt doesn't have an established dialysis center, released two years ago from [facility] from an [facility] clinic.  Has been going to ED to obtain dialysis at [facility] and [facility].   -Pt seen during HD today, tolerating well, had cramp earlier but now resolved, continue to monitor status during HD.    2.  Anemia of CKD: hgb 9.7 today, continue epogen 10000 units IV with HD.   3.  SHPTH:  Phos 3.0 and acceptable, continue renvela.   4.  Hypertension:  BP 155/80, will increase amlodipine to  po daily.  5.  Depression/suicidal ideation: per psych.  LOS: 3 Francisca Langenderfer 7/23/20161:52 PM

## 2015-05-01 NOTE — [facility] Group Notes (Signed)
[facility] LCSW Group Therapy  05/01/2015 2:14 PM  Type of Therapy:  Group Therapy  Participation Level:  Did Not Attend  Modes of Intervention:  Discussion, Education, Socialization and Support  Summary of Progress/Problems:Communications: Patients identify how individuals communicate with one another appropriately and inappropriately. Patients will be guided to discuss their thoughts, feelings, and behaviors related to barriers when communicating. The group will process together ways to execute positive and appropriate communications Pt was unable to attend because she was receiving dialysis.    [employer] MSW, LCSWA  05/01/2015, 2:14 PM

## 2015-05-01 NOTE — [facility] Group Notes (Signed)
[facility] Group Notes:  (Nursing/MHT/Case Management/Adjunct)  Date:  05/01/2015  Time:  3:07 AM  Type of Therapy:  Group Therapy  Participation Level:  Active  Participation Quality:  Appropriate  Affect:  Appropriate  Cognitive:  Appropriate  Insight:  Good  Engagement in Group:  Engaged  Modes of Intervention:  n/a  Summary of Progress/Problems:  Veva Holes 05/01/2015, 3:07 AM

## 2015-05-01 NOTE — Progress Notes (Signed)
Patient is pleasant and cooperative, no new needs notes. States relief at being able to resume normal dialysis schedule. Denies SI, HI, and AVH. Speech and affect WDL.

## 2015-05-01 NOTE — Progress Notes (Signed)
[facility] MD Progress Note  05/01/2015 2:53 PM [place] Zou  MRN:  [id number]  Subjective: Ms. Burdo is a 64 year old female with a history of depression, substance use, and multiple medical problems including end-stage renal disease on dialysis and HIV infection admitted for suicidal ideation in the context of severe social stressors including treatment noncompliance and homelessness.  Patient seen and chart reviewed. Patient reports that she continues to feel pretty bad. Says that she is mainly worried about her homelessness and that she would rather be dead than to live like she has been out on the street. Affect blunted and down. Still complains of not sleeping well and is upset that melatonin is not available. She is currently getting dialysis and tolerating that well. Participating in treatment otherwise. Principal Problem: Major depressive disorder, recurrent, severe without psychotic features Diagnosis:   Patient Active Problem List   Diagnosis Date Noted  . Tobacco use disorder [Z72.0] 04/28/2015  . Cocaine use disorder, moderate, dependence [F14.20] 04/28/2015  . Uremia [N19] 03/12/2015  . Major depressive disorder, recurrent, severe without psychotic features [F33.2] 03/12/2015  . COPD (chronic obstructive pulmonary disease) [J44.9] 03/08/2015  . CKD (chronic kidney disease) requiring chronic dialysis [N18.6, Z99.2] 03/08/2015  . ESRD needing dialysis [N18.6] 02/26/2015  . Vaginal discharge [N89.8]   . Essential hypertension [I10]   . Chronic combined systolic and diastolic congestive heart failure [I50.42]   . Macrocytic anemia [D53.9]   . Chest pain [R07.9]   . HIV disease [B20]   . ESRD on dialysis [N18.6, Z99.2]   . Hyperkalemia [E87.5] 07/12/2014  . Screening examination for venereal disease [Z11.3] 05/13/2014  . Encounter for long-term (current) use of other medications [Z79.899] 05/13/2014  . Respiratory failure [J96.90] 05/08/2014  . Dyspnea [R06.00] 04/21/2014  .  Pulmonary edema [J81.1] 04/21/2014  . Crack cocaine use [F14.90] 01/17/2014  . Osteoarthritis of right hip [M16.11] 12/17/2013  . CCF (congestive cardiac failure) [I50.9] 10/30/2013  . Extreme obesity [E66.01] 10/23/2013  . ESRD (end stage renal disease) on dialysis [N18.6, Z99.2] 09/30/2013  . Anemia secondary to renal failure [D63.1] 02/04/2013  . Compulsive tobacco user syndrome [F17.200] 01/30/2013  . Hyperparathyroidism due to renal insufficiency [N25.81] 01/30/2013  . Anxiety state [F41.1] 01/30/2013  . HIV infection [Z21]   . Hypertension [I10]   . 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 Hypertension   . HIV infection   . Morbid obesity   . ESRD (end stage renal disease) on dialysis 09/30/2013    Started dialysis in [place], [place] around 2009.  ESRD due to HTN vs drug abuse according to pt.  Was on dialysis at [facility] until Feb 2015 when she was admitted to a SNF due to homelessness and drug abuse.  Then changed to [facility] on TTS schedule.     . Anginal pain   . CHF (congestive heart failure)   . Shortness of breath   . Pneumonia   . Depression   . Anxiety   . GERD (gastroesophageal reflux disease)   . Arthritis   . Anemia   . Hyperkalemia 08/11/2014  . Homeless   . CHF (congestive heart failure)     Past Surgical History  Procedure Laterality Date  . Arteriovenous graft placement      left forearm  . Cardiac catheterization    . Laparotomy  states seh was cut open because seh was bleeding on the inside   Family History:  Family History  Problem Relation Age of Onset  . Kidney failure Other     niece  . High blood pressure    . Lung cancer    . Breast cancer Neg Hx   . Colon cancer Neg Hx   . Stroke Mother   . HIV/AIDS Brother     died of AIDS   Social History:  History  Alcohol Use  . 0.6 oz/week  . 1  Cans of beer per week     History  Drug Use  . 1.00 per week  . Special: Marijuana, "Crack" cocaine    Comment: crack states quit 10/03/13    History   Social History  . Marital Status: Single    Spouse Name: N/A  . Number of Children: N/A  . Years of Education: N/A   Social History Main Topics  . Smoking status: Current Every Day Smoker -- 0.50 packs/day    Types: Cigarettes    Start date: 05/06/2014  . Smokeless tobacco: Never Used     Comment: recently quit  . Alcohol Use: 0.6 oz/week    1 Cans of beer per week  . Drug Use: 1.00 per week    Special: Marijuana, "Crack" cocaine     Comment: crack states quit 10/03/13  . Sexual Activity: No   Other Topics Concern  . None   Social History Narrative   Was recently in [place] and she left without allowing them to arrange for outpatient hemodialysis.  They also were not able to arrange for home health services.        [name] is whom she moved in with currently.  States her niece may be able to help her get to hemodialysis.     Additional History:    Sleep: Good  Appetite:  Good   Assessment:   Musculoskeletal: Strength & Muscle Tone: within normal limits Gait & Station: normal Patient leans: N/A   Psychiatric Specialty Exam: Physical Exam  Nursing note and vitals reviewed. Constitutional: She appears well-developed and well-nourished.  HENT:  Head: Normocephalic and atraumatic.  Eyes: Conjunctivae are normal. Pupils are equal, round, and reactive to light.  Neck: Normal range of motion.  Cardiovascular: Normal heart sounds.   Respiratory: Effort normal.  GI: Soft.  Musculoskeletal: Normal range of motion.  Neurological: She is alert.  Skin: Skin is warm and dry.  Psychiatric: Her speech is normal. Judgment and thought content normal. Her affect is blunt. She is slowed. Cognition and memory are normal.    Review of Systems  Constitutional: Positive for malaise/fatigue.  HENT:  Negative.   Eyes: Negative.   Respiratory: Negative.   Cardiovascular: Negative.   Gastrointestinal: Negative.   Musculoskeletal: Negative.   Skin: Negative.   Neurological: Negative.   Psychiatric/Behavioral: Positive for depression, suicidal ideas and substance abuse. The patient is nervous/anxious and has insomnia.   All other systems reviewed and are negative.   Blood pressure 181/108, pulse 99, temperature 98.1 F (36.7 C), temperature source Oral, resp. rate 20, height 5' 3.78" (1.62 m), weight 114.306 kg (252 lb), SpO2 98 %.Body mass index is 43.56 kg/(m^2).  General Appearance: Casual  Eye Contact::  Good  Speech:  Clear and Coherent  Volume:  Normal  Mood:  Anxious  Affect:  Flat  Thought Process:  Goal Directed  Orientation:  Full (Time, Place, and Person)  Thought Content:  WDL  Suicidal Thoughts:  Yes.  without intent/plan  Homicidal Thoughts:  No  Memory:  Immediate;   Fair Recent;   Fair Remote;   Fair  Judgement:  Fair  Insight:  Fair  Psychomotor Activity:  Normal  Concentration:  Fair  Recall:  [employer] of Knowledge:Fair  Language: Fair  Akathisia:  No  Handed:  Right  AIMS (if indicated):     Assets:  Communication Skills Desire for Improvement Financial Resources/Insurance  ADL's:  Intact  Cognition: WNL  Sleep:  Number of Hours: 6.25     Current Medications: Current Facility-Administered Medications  Medication Dose Route Frequency Provider Last Rate Last Dose  . 0.9 %  sodium chloride infusion  100 mL Intravenous PRN Munsoor Lateef, MD      . 0.9 %  sodium chloride infusion  100 mL Intravenous PRN Munsoor Lateef, MD      . acetaminophen (TYLENOL) tablet 650 mg  650 mg Oral Q6H PRN Jolanta B Pucilowska, MD      . albuterol (PROVENTIL) (2.5 MG/3ML) 0.083% nebulizer solution 3 mL  3 mL Inhalation Q6H PRN Clovis Fredrickson, MD   3 mL at 04/28/15 2235  . alum & mag hydroxide-simeth (MAALOX/MYLANTA) 200-200-20 MG/5ML suspension 30 mL  30 mL  Oral Q4H PRN Clovis Fredrickson, MD      . Derrill Memo ON 05/02/2015] amLODipine (NORVASC) tablet 10 mg  10 mg Oral Daily Munsoor Lateef, MD      . b complex-vitamin c-folic acid (NEPHRO-VITE) tablet 1 tablet  1 tablet Oral Daily Epifanio Lesches, MD   1 tablet at 05/01/15 0917  . colchicine tablet 0.6 mg  0.6 mg Oral Daily Clovis Fredrickson, MD   0.6 mg at 05/01/15 0922  . diphenhydrAMINE (BENADRYL) capsule 50 mg  50 mg Oral QHS Clovis Fredrickson, MD   50 mg at 04/30/15 2146  . dolutegravir (TIVICAY) tablet 50 mg  50 mg Oral Daily Campbell Riches, MD   50 mg at 05/01/15 0929  . feeding supplement (NEPRO CARB STEADY) liquid 237 mL  237 mL Oral q morning - 10a Munsoor Lateef, MD   237 mL at 05/01/15 1142  . FLUoxetine (PROZAC) capsule 20 mg  20 mg Oral Daily Jolanta B Pucilowska, MD   20 mg at 05/01/15 0918  . heparin injection 1,000 Units  1,000 Units Dialysis PRN Munsoor Lateef, MD      . hydrALAZINE (APRESOLINE) tablet 25 mg  25 mg Oral 3 times per day Clovis Fredrickson, MD   25 mg at 04/30/15 2147  . lamiVUDine (EPIVIR) 10 MG/ML solution 25 mg  25 mg Oral Daily Clovis Fredrickson, MD   25 mg at 05/01/15 0929  . lidocaine (PF) (XYLOCAINE) 1 % injection 5 mL  5 mL Intradermal PRN Munsoor Lateef, MD      . lidocaine-prilocaine (EMLA) cream 1 application  1 application Topical PRN Munsoor Lateef, MD      . magnesium hydroxide (MILK OF MAGNESIA) suspension 30 mL  30 mL Oral Daily PRN Jolanta B Pucilowska, MD      . nicotine (NICOTROL) 10 MG inhaler 1 continuous puffing  1 continuous puffing Inhalation PRN Clovis Fredrickson, MD   1 continuous puffing at 04/28/15 2011  . pantoprazole (PROTONIX) EC tablet 40 mg  40 mg Oral Daily Clovis Fredrickson, MD   40 mg at 05/01/15 0917  . pentafluoroprop-tetrafluoroeth (GEBAUERS) aerosol 1 application  1 application Topical PRN Munsoor Holley Raring, MD      .  sevelamer carbonate (RENVELA) tablet 2,400 mg  2,400 mg Oral TID WC Epifanio Lesches, MD    2,400 mg at 05/01/15 0928  . temazepam (RESTORIL) capsule 7.5 mg  7.5 mg Oral QHS Gonzella Lex, MD      . tenofovir Veva Holes) tablet 300 mg  300 mg Oral Weekly Epifanio Lesches, MD   300 mg at 04/30/15 1033  . tiotropium (SPIRIVA) inhalation capsule 18 mcg  18 mcg Inhalation Daily Clovis Fredrickson, MD   18 mcg at 05/01/15 0254   Facility-Administered Medications Ordered in Other Encounters  Medication Dose Route Frequency Provider Last Rate Last Dose  . 0.9 %  sodium chloride infusion  100 mL Intravenous PRN Shelle Iron, NP      . 0.9 %  sodium chloride infusion  100 mL Intravenous PRN Shelle Iron, NP      . heparin injection 2,300 Units  20 Units/kg Dialysis Once in dialysis Shelle Iron, NP        Lab Results:  Results for orders placed or performed during the hospital encounter of 04/28/15 (from the past 48 hour(s))  Lipid panel     Status: Abnormal   Collection Time: 04/30/15  2:05 PM  Result Value Ref Range   Cholesterol 157 0 - 200 mg/dL   Triglycerides 225 (H) <150 mg/dL   HDL 32 (L) >40 mg/dL   Total CHOL/HDL Ratio 4.9 RATIO   VLDL 45 (H) 0 - 40 mg/dL   LDL Cholesterol 80 0 - 99 mg/dL    Comment:        Total Cholesterol/HDL:CHD Risk Coronary Heart Disease Risk Table                     Men   Women  1/2 Average Risk   3.4   3.3  Average Risk       5.0   4.4  2 X Average Risk   9.6   7.1  3 X Average Risk  23.4   11.0        Use the calculated Patient Ratio above and the CHD Risk Table to determine the patient's CHD Risk.        ATP III CLASSIFICATION (LDL):  <100     mg/dL   Optimal  100-129  mg/dL   Near or Above                    Optimal  130-159  mg/dL   Borderline  160-189  mg/dL   High  >190     mg/dL   Very High   Phosphorus     Status: None   Collection Time: 05/01/15 10:48 AM  Result Value Ref Range   Phosphorus 3.0 2.5 - 4.6 mg/dL  Renal function panel     Status: Abnormal   Collection Time: 05/01/15 10:48 AM  Result Value Ref Range    Sodium 135 135 - 145 mmol/L   Potassium 4.6 3.5 - 5.1 mmol/L   Chloride 95 (L) 101 - 111 mmol/L   CO2 27 22 - 32 mmol/L   Glucose, Bld 113 (H) 65 - 99 mg/dL   BUN 33 (H) 6 - 20 mg/dL   Creatinine, Ser 8.62 (H) 0.44 - 1.00 mg/dL   Calcium 8.1 (L) 8.9 - 10.3 mg/dL   Phosphorus 3.0 2.5 - 4.6 mg/dL   Albumin 3.5 3.5 - 5.0 g/dL   GFR calc non Af Amer 4 (L) >60 mL/min   GFR calc Af  Amer 5 (L) >60 mL/min    Comment: (NOTE) The eGFR has been calculated using the CKD EPI equation. This calculation has not been validated in all clinical situations. eGFR's persistently <60 mL/min signify possible Chronic Kidney Disease.    Anion gap 13 5 - 15  CBC     Status: Abnormal   Collection Time: 05/01/15 10:48 AM  Result Value Ref Range   WBC 7.2 3.6 - 11.0 K/uL   RBC 2.80 (L) 3.80 - 5.20 MIL/uL   Hemoglobin 9.7 (L) 12.0 - 16.0 g/dL   HCT 29.3 (L) 35.0 - 47.0 %   MCV 104.5 (H) 80.0 - 100.0 fL   MCH 34.6 (H) 26.0 - 34.0 pg   MCHC 33.1 32.0 - 36.0 g/dL   RDW 14.2 11.5 - 14.5 %   Platelets 268 150 - 440 K/uL    Physical Findings: AIMS:  , ,  ,  ,    CIWA:    COWS:     Treatment Plan Summary: Daily contact with patient to assess and evaluate symptoms and progress in treatment and Medication management   Medical Decision Making:  Established Problem, Stable/Improving (1), Review of Psycho-Social Stressors (1), Review or order clinical lab tests (1), Review of Medication Regimen & Side Effects (2) and Review of New Medication or Change in Dosage (2)   Ms. Witter is a 64 year old female with history of depression and multiple medical problems admitted for suicidal ideation in the context of homelessness and treatment noncompliance.  1. Suicidal ideation. The patient is able to contract for safety in the hospital.  2. Mood. She reports that Prozac has been helpful in the past she was started on 20 mg of Prozac.  3. End-stage kidney disease on dialysis. She resumed dialysis under the care of  Dr. Holley Raring.   4. HIV. She has not been compliant with her treatment lately. She was restarted on medications by Dr. Johnnye Sima.   5. COPD and hypertension. Followed by medicine. Will continue inhalers. On admission her blood pressure was 151/131.   6. Smoking. Nicotine products aren't available.  7. Substance abuse. She reports she relapsed on crack last weekend after several months of sobriety.  8. Social. She is homeless but has resources to be placed in assisted living facility. She would like to live in [place] where her son now resides  40. Insomnia. Benadryl 50 mg nightly. I am also adding 7.5 mg of Restoril at night for sleep  10. Disposition. To be established. Supportive counseling. Reviewed medicine. No other change to medication other than adding low dose of Restoril. Follow-up tomorrow.     Tehani Mersman 05/01/2015, 2:53 PM

## 2015-05-02 MED ORDER — AMITRIPTYLINE HCL 10 MG PO TABS
10.0000 mg | ORAL_TABLET | Freq: Every day | ORAL | Status: DC
  Administered 2015-05-02 – 2015-05-05 (×4): 10 mg via ORAL
  Filled 2015-05-02 (×5): qty 1

## 2015-05-02 NOTE — Progress Notes (Signed)
[facility] MD Progress Note  05/02/2015 11:13 PM [name]  MRN:  [id number]  Subjective: [name] is a 64 year old female with a history of depression, substance use, and multiple medical problems including end-stage renal disease on dialysis and HIV infection admitted for suicidal ideation in the context of severe social stressors including treatment noncompliance and homelessness.  Today she did not have dialysis. She was up and around the unit in her wheelchair interacting with others and going about her business. She still tells me that she thinks if she can find a safe place to live that she is liable to be suicidal. There is a little bit of manipulative noticed to it but it is also clear that she is desperate about finding a place to live. She says she would be happy to live in an assisted living facility if one could be found for her. Thoughts appear to be lucid. Medical problems under fairly good control. Tolerating medicine. Did not have dialysis today.  Principal Problem: Major depressive disorder, recurrent, severe without psychotic features Diagnosis:   Patient Active Problem List   Diagnosis Date Noted  . Tobacco use disorder [Z72.0] 04/28/2015  . Cocaine use disorder, moderate, dependence [F14.20] 04/28/2015  . Uremia [N19] 03/12/2015  . Major depressive disorder, recurrent, severe without psychotic features [F33.2] 03/12/2015  . COPD (chronic obstructive pulmonary disease) [J44.9] 03/08/2015  . CKD (chronic kidney disease) requiring chronic dialysis [N18.6, Z99.2] 03/08/2015  . ESRD needing dialysis [N18.6] 02/26/2015  . Vaginal discharge [N89.8]   . Essential hypertension [I10]   . Chronic combined systolic and diastolic congestive heart failure [I50.42]   . Macrocytic anemia [D53.9]   . Chest pain [R07.9]   . HIV disease [B20]   . ESRD on dialysis [N18.6, Z99.2]   . Hyperkalemia [E87.5] 07/12/2014  . Screening examination for venereal disease [Z11.3] 05/13/2014  .  Encounter for long-term (current) use of other medications [Z79.899] 05/13/2014  . Respiratory failure [J96.90] 05/08/2014  . Dyspnea [R06.00] 04/21/2014  . Pulmonary edema [J81.1] 04/21/2014  . Crack cocaine use [F14.90] 01/17/2014  . Osteoarthritis of right hip [M16.11] 12/17/2013  . CCF (congestive cardiac failure) [I50.9] 10/30/2013  . Extreme obesity [E66.01] 10/23/2013  . ESRD (end stage renal disease) on dialysis [N18.6, Z99.2] 09/30/2013  . Anemia secondary to renal failure [D63.1] 02/04/2013  . Compulsive tobacco user syndrome [F17.200] 01/30/2013  . Hyperparathyroidism due to renal insufficiency [N25.81] 01/30/2013  . Anxiety state [F41.1] 01/30/2013  . HIV infection [Z21]   . Hypertension [I10]   . 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 Hypertension   . HIV infection   . Morbid obesity   . ESRD (end stage renal disease) on dialysis 09/30/2013    Started dialysis in [place], [place] around 2009.  ESRD due to HTN vs drug abuse according to pt.  Was on dialysis at [facility] until Feb 2015 when she was admitted to a SNF due to homelessness and drug abuse.  Then changed to [facility] on TTS schedule.     . Anginal pain   . CHF (congestive heart failure)   . Shortness of breath   . Pneumonia   . Depression   . Anxiety   . GERD (gastroesophageal reflux disease)   . Arthritis   . Anemia   . Hyperkalemia 08/11/2014  . Homeless   .  CHF (congestive heart failure)     Past Surgical History  Procedure Laterality Date  . Arteriovenous graft placement      left forearm  . Cardiac catheterization    . Laparotomy      states seh was cut open because seh was bleeding on the inside   Family History:  Family History  Problem Relation Age of Onset  . Kidney failure Other     niece  . High blood pressure    . Lung cancer    . Breast  cancer Neg Hx   . Colon cancer Neg Hx   . Stroke Mother   . HIV/AIDS Brother     died of AIDS   Social History:  History  Alcohol Use  . 0.6 oz/week  . 1 Cans of beer per week     History  Drug Use  . 1.00 per week  . Special: Marijuana, "Crack" cocaine    Comment: crack states quit 10/03/13    History   Social History  . Marital Status: Single    Spouse Name: N/A  . Number of Children: N/A  . Years of Education: N/A   Social History Main Topics  . Smoking status: Current Every Day Smoker -- 0.50 packs/day    Types: Cigarettes    Start date: 05/06/2014  . Smokeless tobacco: Never Used     Comment: recently quit  . Alcohol Use: 0.6 oz/week    1 Cans of beer per week  . Drug Use: 1.00 per week    Special: Marijuana, "Crack" cocaine     Comment: crack states quit 10/03/13  . Sexual Activity: No   Other Topics Concern  . None   Social History Narrative   Was recently in [place] and she left without allowing them to arrange for outpatient hemodialysis.  They also were not able to arrange for home health services.        [name] is whom she moved in with currently.  States her niece may be able to help her get to hemodialysis.     Additional History:    Sleep: Good  Appetite:  Good   Assessment:   Musculoskeletal: Strength & Muscle Tone: within normal limits Gait & Station: normal Patient leans: N/A   Psychiatric Specialty Exam: Physical Exam  Nursing note and vitals reviewed. Constitutional: She appears well-developed and well-nourished.  HENT:  Head: Normocephalic and atraumatic.  Eyes: Conjunctivae are normal. Pupils are equal, round, and reactive to light.  Neck: Normal range of motion.  Cardiovascular: Normal heart sounds.   Respiratory: Effort normal.  GI: Soft.  Musculoskeletal: Normal range of motion.  Neurological: She is alert.  Skin: Skin is warm and dry.  Psychiatric: Her speech is normal. Judgment and thought  content normal. Her affect is blunt. She is slowed. Cognition and memory are normal.    Review of Systems  Constitutional: Positive for malaise/fatigue.  HENT: Negative.   Eyes: Negative.   Respiratory: Negative.   Cardiovascular: Negative.   Gastrointestinal: Negative.   Musculoskeletal: Negative.   Skin: Negative.   Neurological: Negative.   Psychiatric/Behavioral: Positive for depression, suicidal ideas and substance abuse. The patient is nervous/anxious and has insomnia.   All other systems reviewed and are negative.   Blood pressure 175/84, pulse 99, temperature 98 F (36.7 C), temperature source Oral, resp. rate 20, height 5' 3.78" (1.62 m), weight 114.306 kg (252 lb), SpO2 98 %.Body mass index is 43.56 kg/(m^2).  General Appearance: Casual  Eye Contact::  Good  Speech:  Clear and Coherent  Volume:  Normal  Mood:  Anxious  Affect:  Flat  Thought Process:  Goal Directed  Orientation:  Full (Time, Place, and Person)  Thought Content:  WDL  Suicidal Thoughts:  Yes.  without intent/plan  Homicidal Thoughts:  No  Memory:  Immediate;   Fair Recent;   Fair Remote;   Fair  Judgement:  Fair  Insight:  Fair  Psychomotor Activity:  Normal  Concentration:  Fair  Recall:  [employer] of Knowledge:Fair  Language: Fair  Akathisia:  No  Handed:  Right  AIMS (if indicated):     Assets:  Communication Skills Desire for Improvement Financial Resources/Insurance  ADL's:  Intact  Cognition: WNL  Sleep:  Number of Hours: 6.25     Current Medications: Current Facility-Administered Medications  Medication Dose Route Frequency Provider Last Rate Last Dose  . 0.9 %  sodium chloride infusion  100 mL Intravenous PRN Munsoor Lateef, MD      . 0.9 %  sodium chloride infusion  100 mL Intravenous PRN Munsoor Lateef, MD      . acetaminophen (TYLENOL) tablet 650 mg  650 mg Oral Q6H PRN Jolanta B Pucilowska, MD      . albuterol (PROVENTIL) (2.5 MG/3ML) 0.083% nebulizer solution 3 mL  3 mL  Inhalation Q6H PRN Clovis Fredrickson, MD   3 mL at 04/28/15 2235  . alum & mag hydroxide-simeth (MAALOX/MYLANTA) 200-200-20 MG/5ML suspension 30 mL  30 mL Oral Q4H PRN Jolanta B Pucilowska, MD      . amitriptyline (ELAVIL) tablet 10 mg  10 mg Oral QHS Gonzella Lex, MD   10 mg at 05/02/15 2120  . amLODipine (NORVASC) tablet 10 mg  10 mg Oral Daily Munsoor Lateef, MD   10 mg at 05/02/15 0910  . b complex-vitamin c-folic acid (NEPHRO-VITE) tablet 1 tablet  1 tablet Oral Daily Epifanio Lesches, MD   1 tablet at 05/02/15 0915  . colchicine tablet 0.6 mg  0.6 mg Oral Daily Jolanta B Pucilowska, MD   0.6 mg at 05/02/15 0911  . diphenhydrAMINE (BENADRYL) capsule 50 mg  50 mg Oral QHS Clovis Fredrickson, MD   50 mg at 05/02/15 2120  . dolutegravir (TIVICAY) tablet 50 mg  50 mg Oral Daily Campbell Riches, MD   50 mg at 05/02/15 0911  . feeding supplement (NEPRO CARB STEADY) liquid 237 mL  237 mL Oral q morning - 10a Munsoor Lateef, MD   237 mL at 05/02/15 0916  . FLUoxetine (PROZAC) capsule 20 mg  20 mg Oral Daily Jolanta B Pucilowska, MD   20 mg at 05/02/15 0912  . heparin injection 1,000 Units  1,000 Units Dialysis PRN Munsoor Lateef, MD      . hydrALAZINE (APRESOLINE) tablet 25 mg  25 mg Oral 3 times per day Clovis Fredrickson, MD   25 mg at 05/02/15 2120  . lamiVUDine (EPIVIR) 10 MG/ML solution 25 mg  25 mg Oral Daily Jolanta B Pucilowska, MD   25 mg at 05/02/15 0913  . lidocaine (PF) (XYLOCAINE) 1 % injection 5 mL  5 mL Intradermal PRN Munsoor Lateef, MD      . lidocaine-prilocaine (EMLA) cream 1 application  1 application Topical PRN Munsoor Lateef, MD      . magnesium hydroxide (MILK OF MAGNESIA) suspension 30 mL  30 mL Oral Daily PRN Jolanta B Pucilowska, MD      . nicotine (NICOTROL) 10 MG  inhaler 1 continuous puffing  1 continuous puffing Inhalation PRN Clovis Fredrickson, MD   1 continuous puffing at 05/01/15 1825  . pantoprazole (PROTONIX) EC tablet 40 mg  40 mg Oral Daily Jolanta  B Pucilowska, MD   40 mg at 05/02/15 0913  . pentafluoroprop-tetrafluoroeth (GEBAUERS) aerosol 1 application  1 application Topical PRN Munsoor Lateef, MD      . sevelamer carbonate (RENVELA) tablet 2,400 mg  2,400 mg Oral TID WC Epifanio Lesches, MD   2,400 mg at 05/02/15 1724  . tenofovir (VIREAD) tablet 300 mg  300 mg Oral Weekly Epifanio Lesches, MD   300 mg at 04/30/15 1033  . tiotropium (SPIRIVA) inhalation capsule 18 mcg  18 mcg Inhalation Daily Clovis Fredrickson, MD   18 mcg at 05/02/15 4132   Facility-Administered Medications Ordered in Other Encounters  Medication Dose Route Frequency Provider Last Rate Last Dose  . 0.9 %  sodium chloride infusion  100 mL Intravenous PRN Shelle Iron, NP      . 0.9 %  sodium chloride infusion  100 mL Intravenous PRN Shelle Iron, NP      . heparin injection 2,300 Units  20 Units/kg Dialysis Once in dialysis Shelle Iron, NP        Lab Results:  Results for orders placed or performed during the hospital encounter of 04/28/15 (from the past 48 hour(s))  Phosphorus     Status: None   Collection Time: 05/01/15 10:48 AM  Result Value Ref Range   Phosphorus 3.0 2.5 - 4.6 mg/dL  Renal function panel     Status: Abnormal   Collection Time: 05/01/15 10:48 AM  Result Value Ref Range   Sodium 135 135 - 145 mmol/L   Potassium 4.6 3.5 - 5.1 mmol/L   Chloride 95 (L) 101 - 111 mmol/L   CO2 27 22 - 32 mmol/L   Glucose, Bld 113 (H) 65 - 99 mg/dL   BUN 33 (H) 6 - 20 mg/dL   Creatinine, Ser 8.62 (H) 0.44 - 1.00 mg/dL   Calcium 8.1 (L) 8.9 - 10.3 mg/dL   Phosphorus 3.0 2.5 - 4.6 mg/dL   Albumin 3.5 3.5 - 5.0 g/dL   GFR calc non Af Amer 4 (L) >60 mL/min   GFR calc Af Amer 5 (L) >60 mL/min    Comment: (NOTE) The eGFR has been calculated using the CKD EPI equation. This calculation has not been validated in all clinical situations. eGFR's persistently <60 mL/min signify possible Chronic Kidney Disease.    Anion gap 13 5 - 15  CBC      Status: Abnormal   Collection Time: 05/01/15 10:48 AM  Result Value Ref Range   WBC 7.2 3.6 - 11.0 K/uL   RBC 2.80 (L) 3.80 - 5.20 MIL/uL   Hemoglobin 9.7 (L) 12.0 - 16.0 g/dL   HCT 29.3 (L) 35.0 - 47.0 %   MCV 104.5 (H) 80.0 - 100.0 fL   MCH 34.6 (H) 26.0 - 34.0 pg   MCHC 33.1 32.0 - 36.0 g/dL   RDW 14.2 11.5 - 14.5 %   Platelets 268 150 - 440 K/uL    Physical Findings: AIMS:  , ,  ,  ,    CIWA:    COWS:     Treatment Plan Summary: Daily contact with patient to assess and evaluate symptoms and progress in treatment and Medication management   Medical Decision Making:  Established Problem, Stable/Improving (1), Review of Psycho-Social Stressors (1), Review or order clinical lab  tests (1), Review of Medication Regimen & Side Effects (2) and Review of New Medication or Change in Dosage (2)   [name] is a 64 year old female with history of depression and multiple medical problems admitted for suicidal ideation in the context of homelessness and treatment noncompliance.  1. Suicidal ideation. The patient is able to contract for safety in the hospital.  2. Mood. She reports that Prozac has been helpful in the past she was started on 20 mg of Prozac.  3. End-stage kidney disease on dialysis. She resumed dialysis under the care of Dr. Holley Raring.   4. HIV. She has not been compliant with her treatment lately. She was restarted on medications by Dr. Johnnye Sima.   5. COPD and hypertension. Followed by medicine. Will continue inhalers. On admission her blood pressure was 151/131.   6. Smoking. Nicotine products aren't available.  7. Substance abuse. She reports she relapsed on crack last weekend after several months of sobriety.  8. Social. She is homeless but has resources to be placed in assisted living facility. She would like to live in [place] where her son now resides  44. Insomnia. Benadryl 50 mg nightly. I am also adding 7.5 mg of Restoril at night for sleep  10.  Disposition. To be established. Supportive counseling. Reviewed discharge options. No change to medication. Continue current treatment plan.     [name] 05/02/2015, 11:13 PM

## 2015-05-02 NOTE — Plan of Care (Signed)
Problem: Ineffective individual coping Goal: LTG: Patient will report a decrease in negative feelings Outcome: Not Progressing Patient states depression and worry about her current lack of living arrangements. Continues to state she would rather not live than be homeless or have to go back where she was.

## 2015-05-02 NOTE — Plan of Care (Signed)
Problem: Ineffective individual coping Goal: STG: Patient will remain free from self harm Outcome: Progressing No self-harm.      

## 2015-05-02 NOTE — Progress Notes (Signed)
Pt has been more seclusive to her room this period.Pt has attended less activities this period. Pt's mood and affect has been depressed. Pt continues to be active on the unit, interacting well with both staff and peers. Will continue to observe and maintain a safe environment.

## 2015-05-02 NOTE — [facility] Group Notes (Signed)
[facility] LCSW Group Therapy  05/02/2015 2:33 PM  Type of Therapy:  Group Therapy  Participation Level:  Did Not Attend  Modes of Intervention:  Discussion, Education, Socialization and Support  Summary of Progress/Problems:Pts were asked to identify what balance means to them. They were encouraged to identify what throws them off balance and how to regain balance.   [name] L [name] MSW, LCSWA  05/02/2015, 2:33 PM  

## 2015-05-02 NOTE — Progress Notes (Signed)
Patient notes depression and worry about where she will live upon discharge. She continues to state she does not want to live if she has to go back where she was or if she will be homeless. She is redirectable and was able to be reassured. She notes she understands her past behavior has inhibited her ability to find placement. She states she is pleased to have had dialysis and is looking forward to being on a regular schedule again. She notes no other distress or needs.

## 2015-05-03 MED ORDER — PNEUMOCOCCAL VAC POLYVALENT 25 MCG/0.5ML IJ INJ: 0.5000 mL | INJECTION | INTRAMUSCULAR | Status: DC

## 2015-05-03 NOTE — [facility] Group Notes (Signed)
[facility] LCSW Group Therapy  05/03/2015 3:32 PM  Type of Therapy:  Group Therapy  Participation Level:  Did Not Attend    Brittan Mapel T, MSW, LCSWA 05/03/2015, 3:32 PM  

## 2015-05-03 NOTE — Progress Notes (Signed)
Recreation Therapy Notes  Date: 07.25.16 Time: 3:00 pm Location: Craft Room  Group Topic: Self-expression  Goal Area(s) Addresses:  Patient will effectively use art as a means of self-expression. Patient will recognize positive benefit of self-expression. Patient will be able to identify one emotion experienced during group session. Patient will identify use of art/self-expression as a coping skill.  Behavioral Response: Attentive, Interactive  Intervention: Two Faces of Me  Activity: Patients were given a blank face worksheet and instructed to draw a line down the middle. On one side they were instructed to draw or write how they felt when they were admitted to the hospital. On the other side they were instructed to draw or write how they want to feel when they are d/c from the hospital.  Education: LRT educated patients on different forms of self-expression.  Education Outcome: In group clarification offered   Clinical Observations/Feedback: Patient drew one face. Patient contributed to group discussion by stating how her felt when she was admitted to the hospital and how she wants to feel when she is d/c.  Jacquelynn Cree, LRT/CTRS 05/03/2015 4:09 PM

## 2015-05-03 NOTE — Progress Notes (Signed)
Patient complaining of shortness of breath. Respirations at 22, even and not labored. Initiated nebulizer treatment of albuterol per MD order. Patient experiencing relief. Will continue to monitor.

## 2015-05-03 NOTE — [facility] Group Notes (Signed)
[facility] Group Notes:  (Nursing/MHT/Case Management/Adjunct)  Date:  05/03/2015  Time:  12:36 PM  Type of Therapy:  Psychoeducational Skills  Participation Level:  Active  Participation Quality:  Appropriate, Attentive and Sharing  Affect:  Appropriate  Cognitive:  Alert and Appropriate  Insight:  Appropriate and Good  Engagement in Group:  Engaged  Modes of Intervention:  Discussion, Education and Support  Summary of Progress/Problems:  [name] [facility] 05/03/2015, 12:36 PM

## 2015-05-03 NOTE — Progress Notes (Signed)
Patient is alert and oriented x 4. Her mood is euthymic with congruent affect. Denies SI. No evidence of psychotic thinking. Patients speech is clear and coherent, thoughts are organized. Patient has numerous somatic complaints this morning but felt better after receiving all morning medications and a PRN of Tylenol for headache. She says she is not happy with her dialysis schedule. She says she is not sure where she will go for dialysis after discharge. Patient is very concerned about housing and aftercare. She was reassured that social work is involved in her discharge plans.  Plan - continue current treatment plan. Monitor mood, mental status. Monitor renal status and support with dialysis. Maintain on 15 minute checks for safety.

## 2015-05-03 NOTE — Progress Notes (Signed)
[facility] MD Progress Note  05/03/2015 1:23 PM [name]  MRN:  [id number]  Subjective:  Ms. Reinecke feels dispirited today. She is worried about her disposition. She would like to be placed in an assisted living facility so she no longer is homeless and can continue in dialysis. No facilities have been identified at this point. The patient "burnt a lot of bridges" in many counties. Social work is actively looking. She already has a PASSAR number. This morning she experience some shortness of breath that was alleviated with the COPD treatment. She tolerates medications well and she takes dialysis as necessary. Sleep is good. Appetite fair. She eagerly participates in groups.  Principal Problem: Major depressive disorder, recurrent, severe without psychotic features Diagnosis:   Patient Active Problem List   Diagnosis Date Noted  . Tobacco use disorder [Z72.0] 04/28/2015  . Cocaine use disorder, moderate, dependence [F14.20] 04/28/2015  . Uremia [N19] 03/12/2015  . Major depressive disorder, recurrent, severe without psychotic features [F33.2] 03/12/2015  . COPD (chronic obstructive pulmonary disease) [J44.9] 03/08/2015  . CKD (chronic kidney disease) requiring chronic dialysis [N18.6, Z99.2] 03/08/2015  . ESRD needing dialysis [N18.6] 02/26/2015  . Vaginal discharge [N89.8]   . Essential hypertension [I10]   . Chronic combined systolic and diastolic congestive heart failure [I50.42]   . Macrocytic anemia [D53.9]   . Chest pain [R07.9]   . HIV disease [B20]   . ESRD on dialysis [N18.6, Z99.2]   . Hyperkalemia [E87.5] 07/12/2014  . Screening examination for venereal disease [Z11.3] 05/13/2014  . Encounter for long-term (current) use of other medications [Z79.899] 05/13/2014  . Respiratory failure [J96.90] 05/08/2014  . Dyspnea [R06.00] 04/21/2014  . Pulmonary edema [J81.1] 04/21/2014  . Crack cocaine use [F14.90] 01/17/2014  . Osteoarthritis of right hip [M16.11] 12/17/2013  . [facility]  (congestive cardiac failure) [I50.9] 10/30/2013  . Extreme obesity [E66.01] 10/23/2013  . ESRD (end stage renal disease) on dialysis [N18.6, Z99.2] 09/30/2013  . Anemia secondary to renal failure [D63.1] 02/04/2013  . Compulsive tobacco user syndrome [F17.200] 01/30/2013  . Hyperparathyroidism due to renal insufficiency [N25.81] 01/30/2013  . Anxiety state [F41.1] 01/30/2013  . HIV infection [Z21]   . Hypertension [I10]   . 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 Hypertension   . HIV infection   . Morbid obesity   . ESRD (end stage renal disease) on dialysis 09/30/2013    Started dialysis in [place], [place] around 2009.  ESRD due to HTN vs drug abuse according to pt.  Was on dialysis at [facility] until Feb 2015 when she was admitted to a SNF due to homelessness and drug abuse.  Then changed to [facility] on TTS schedule.     . Anginal pain   . CHF (congestive heart failure)   . Shortness of breath   . Pneumonia   . Depression   . Anxiety   . GERD (gastroesophageal reflux disease)   . Arthritis   . Anemia   . Hyperkalemia 08/11/2014  . Homeless   . CHF (congestive heart failure)     Past Surgical History  Procedure Laterality Date  . Arteriovenous graft placement      left forearm  . Cardiac catheterization    . Laparotomy      states seh was cut open because seh was bleeding on the inside  Family History:  Family History  Problem Relation Age of Onset  . Kidney failure Other     niece  . High blood pressure    . Lung cancer    . Breast cancer Neg Hx   . Colon cancer Neg Hx   . Stroke Mother   . HIV/AIDS Brother     died of AIDS   Social History:  History  Alcohol Use  . 0.6 oz/week  . 1 Cans of beer per week     History  Drug Use  . 1.00 per week  . Special: Marijuana, "Crack" cocaine    Comment: crack states quit  10/03/13    History   Social History  . Marital Status: Single    Spouse Name: N/A  . Number of Children: N/A  . Years of Education: N/A   Social History Main Topics  . Smoking status: Current Every Day Smoker -- 0.50 packs/day    Types: Cigarettes    Start date: 05/06/2014  . Smokeless tobacco: Never Used     Comment: recently quit  . Alcohol Use: 0.6 oz/week    1 Cans of beer per week  . Drug Use: 1.00 per week    Special: Marijuana, "Crack" cocaine     Comment: crack states quit 10/03/13  . Sexual Activity: No   Other Topics Concern  . None   Social History Narrative   Was recently in [facility] nursing home and she left without allowing them to arrange for outpatient hemodialysis.  They also were not able to arrange for home health services.        [name] is whom she moved in with currently.  States her niece may be able to help her get to hemodialysis.     Additional History:    Sleep: Good  Appetite:  Good   Assessment:   Musculoskeletal: Strength & Muscle Tone: within normal limits Gait & Station: normal Patient leans: N/A   Psychiatric Specialty Exam: Physical Exam  Nursing note and vitals reviewed.   Review of Systems  Respiratory: Positive for shortness of breath.   All other systems reviewed and are negative.   Blood pressure 159/88, pulse 102, temperature 98 F (36.7 C), temperature source Oral, resp. rate 19, height 5' 3.78" (1.62 m), weight 114.306 kg (252 lb), SpO2 98 %.Body mass index is 43.56 kg/(m^2).  General Appearance: Casual  Eye Contact::  Good  Speech:  Clear and Coherent  Volume:  Normal  Mood:  Anxious  Affect:  Appropriate  Thought Process:  Goal Directed  Orientation:  Full (Time, Place, and Person)  Thought Content:  WDL  Suicidal Thoughts:  No  Homicidal Thoughts:  No  Memory:  Immediate;   Fair Recent;   Fair Remote;   Fair  Judgement:  Fair  Insight:  Fair  Psychomotor Activity:  Normal  Concentration:  Fair   Recall:  [employer] of Knowledge:Fair  Language: Fair  Akathisia:  No  Handed:  Right  AIMS (if indicated):     Assets:  Communication Skills Desire for Improvement Financial Resources/Insurance  ADL's:  Intact  Cognition: WNL  Sleep:  Number of Hours: 7.25     Current Medications: Current Facility-Administered Medications  Medication Dose Route Frequency Provider Last Rate Last Dose  . 0.9 %  sodium chloride infusion  100 mL Intravenous PRN Munsoor Lateef, MD      . 0.9 %  sodium chloride infusion  100 mL Intravenous PRN Munsoor Lateef, MD      .  acetaminophen (TYLENOL) tablet 650 mg  650 mg Oral Q6H PRN Shari Prows, MD   650 mg at 05/03/15 0951  . albuterol (PROVENTIL) (2.5 MG/3ML) 0.083% nebulizer solution 3 mL  3 mL Inhalation Q6H PRN Shari Prows, MD   3 mL at 04/28/15 2235  . alum & mag hydroxide-simeth (MAALOX/MYLANTA) 200-200-20 MG/5ML suspension 30 mL  30 mL Oral Q4H PRN Margi Edmundson B Vidalia Serpas, MD      . amitriptyline (ELAVIL) tablet 10 mg  10 mg Oral QHS Audery Amel, MD   10 mg at 05/02/15 2120  . amLODipine (NORVASC) tablet 10 mg  10 mg Oral Daily Munsoor Lateef, MD   10 mg at 05/03/15 0948  . b complex-vitamin c-folic acid (NEPHRO-VITE) tablet 1 tablet  1 tablet Oral Daily Katha Hamming, MD   1 tablet at 05/03/15 0947  . colchicine tablet 0.6 mg  0.6 mg Oral Daily Deedra Pro B Dasani Thurlow, MD   0.6 mg at 05/03/15 0947  . diphenhydrAMINE (BENADRYL) capsule 50 mg  50 mg Oral QHS Shari Prows, MD   50 mg at 05/02/15 2120  . dolutegravir (TIVICAY) tablet 50 mg  50 mg Oral Daily Ginnie Smart, MD   50 mg at 05/03/15 0947  . feeding supplement (NEPRO CARB STEADY) liquid 237 mL  237 mL Oral q morning - 10a Munsoor Lateef, MD   237 mL at 05/02/15 0916  . FLUoxetine (PROZAC) capsule 20 mg  20 mg Oral Daily Markevius Trombetta B Joann Kulpa, MD   20 mg at 05/03/15 0947  . heparin injection 1,000 Units  1,000 Units Dialysis PRN Munsoor Lateef, MD      .  hydrALAZINE (APRESOLINE) tablet 25 mg  25 mg Oral 3 times per day Shari Prows, MD   25 mg at 05/03/15 0641  . lamiVUDine (EPIVIR) 10 MG/ML solution 25 mg  25 mg Oral Daily Sherah Lund B Kem Hensen, MD   25 mg at 05/03/15 0948  . lidocaine (PF) (XYLOCAINE) 1 % injection 5 mL  5 mL Intradermal PRN Munsoor Lateef, MD      . lidocaine-prilocaine (EMLA) cream 1 application  1 application Topical PRN Munsoor Lateef, MD      . magnesium hydroxide (MILK OF MAGNESIA) suspension 30 mL  30 mL Oral Daily PRN Teighlor Korson B Kista Robb, MD      . nicotine (NICOTROL) 10 MG inhaler 1 continuous puffing  1 continuous puffing Inhalation PRN Shari Prows, MD   1 continuous puffing at 05/01/15 1825  . pantoprazole (PROTONIX) EC tablet 40 mg  40 mg Oral Daily Shari Prows, MD   40 mg at 05/03/15 0947  . pentafluoroprop-tetrafluoroeth (GEBAUERS) aerosol 1 application  1 application Topical PRN Munsoor Lateef, MD      . Melene Muller ON 05/04/2015] pneumococcal 23 valent vaccine (PNU-IMMUNE) injection 0.5 mL  0.5 mL Intramuscular Tomorrow-1000 Emonni Depasquale B Haleigh Desmith, MD      . sevelamer carbonate (RENVELA) tablet 2,400 mg  2,400 mg Oral TID WC Katha Hamming, MD   2,400 mg at 05/03/15 1155  . tenofovir (VIREAD) tablet 300 mg  300 mg Oral Weekly Katha Hamming, MD   300 mg at 04/30/15 1033  . tiotropium (SPIRIVA) inhalation capsule 18 mcg  18 mcg Inhalation Daily Shari Prows, MD   18 mcg at 05/03/15 0944   Facility-Administered Medications Ordered in Other Encounters  Medication Dose Route Frequency Provider Last Rate Last Dose  . 0.9 %  sodium chloride infusion  100 mL Intravenous PRN Jetty Duhamel,  NP      . 0.9 %  sodium chloride infusion  100 mL Intravenous PRN Jetty Duhamel, NP      . heparin injection 2,300 Units  20 Units/kg Dialysis Once in dialysis Jetty Duhamel, NP        Lab Results: No results found for this or any previous visit (from the past 48 hour(s)).  Physical  Findings: AIMS:  , ,  ,  ,    CIWA:    COWS:     Treatment Plan Summary: Daily contact with patient to assess and evaluate symptoms and progress in treatment and Medication management   Medical Decision Making:  Established Problem, Stable/Improving (1), Review of Psycho-Social Stressors (1), Review or order clinical lab tests (1), Review of Medication Regimen & Side Effects (2) and Review of New Medication or Change in Dosage (2)   Ms. Vallely is a 64 year old female with history of depression and multiple medical problems admitted for suicidal ideation in the context of homelessness and treatment noncompliance.  1. Suicidal ideation. The patient is able to contract for safety in the hospital.  2. Mood. She reports that Prozac has been helpful in the past she was started on 20 mg of Prozac.  3. End-stage kidney disease on dialysis. She resumed dialysis under the care of Dr. Cherylann Ratel.   4. HIV. She has not been compliant with her treatment lately. She was restarted on medications by Dr. Ninetta Lights.   5. COPD and hypertension. Followed by medicine. Will continue inhalers. On admission her blood pressure was 151/131.   6. Smoking. Nicotine products aren't available.  7. Substance abuse. She reports she relapsed on crack last weekend after several months of sobriety.  8. Social. She is homeless but has resources to be placed in assisted living facility. She would like to live in [place] where her son now resides  65. Insomnia. Benadryl 50 mg nightly. I am also adding 7.5 mg of Restoril at night for sleep  10. Disposition. To be established.     [name] 05/03/2015, 1:23 PM

## 2015-05-03 NOTE — Plan of Care (Signed)
Problem: [facility] Participation in Recreation Therapeutic Interventions Goal: STG-Patient will identify at least five coping skills for ** STG: Coping Skills - Within 3 treatment sessions, patient will verbalize at least 5 coping skills for anger in one treatment session to increase anger management skills post d/c.  Outcome: Completed/Met Date Met:  05/03/15 Treatment Session 1; Completed 1 out of 1: At approximately 12:20 pm, LRT met with patient in patient room. Patient verbalized 5 coping skills for anger. Patient verbalized what triggers her to get angry, how her body responds to anger, and how she is going to remember to use her healthy coping skills. LRT provided suggestions as well. Patient reported treatment session was helpful.  Leonette Monarch, LRT/CTRS

## 2015-05-03 NOTE — Plan of Care (Signed)
Problem: Ineffective individual coping Goal: LTG: Patient will report a decrease in negative feelings Outcome: Progressing Pt notes she is feeling better emotionally, but continues to struggle with worry about her future and her physical problems.  Goal: STG: Patient will remain free from self harm Outcome: Progressing No self harm.  Goal: STG-Increase in ability to manage activities of daily living Outcome: Progressing Patient able to provide ADL's independently.

## 2015-05-04 LAB — CBC
HCT: 29 % — ABNORMAL LOW (ref 35.0–47.0)
HEMOGLOBIN: 9.6 g/dL — AB (ref 12.0–16.0)
MCH: 34.7 pg — AB (ref 26.0–34.0)
MCHC: 33.1 g/dL (ref 32.0–36.0)
MCV: 104.6 fL — AB (ref 80.0–100.0)
Platelets: 240 10*3/uL (ref 150–440)
RBC: 2.77 MIL/uL — ABNORMAL LOW (ref 3.80–5.20)
RDW: 14.3 % (ref 11.5–14.5)
WBC: 7.3 10*3/uL (ref 3.6–11.0)

## 2015-05-04 LAB — RENAL FUNCTION PANEL
Albumin: 3.8 g/dL (ref 3.5–5.0)
Anion gap: 14 (ref 5–15)
BUN: 38 mg/dL — ABNORMAL HIGH (ref 6–20)
CO2: 27 mmol/L (ref 22–32)
Calcium: 8.6 mg/dL — ABNORMAL LOW (ref 8.9–10.3)
Chloride: 90 mmol/L — ABNORMAL LOW (ref 101–111)
Creatinine, Ser: 10.38 mg/dL — ABNORMAL HIGH (ref 0.44–1.00)
GFR calc Af Amer: 4 mL/min — ABNORMAL LOW (ref >60)
GFR calc non Af Amer: 3 mL/min — ABNORMAL LOW (ref >60)
Glucose, Bld: 97 mg/dL (ref 65–99)
Phosphorus: 3.6 mg/dL (ref 2.5–4.6)
Potassium: 5.4 mmol/L — ABNORMAL HIGH (ref 3.5–5.1)
Sodium: 131 mmol/L — ABNORMAL LOW (ref 135–145)

## 2015-05-04 LAB — RPR: RPR Ser Ql: NONREACTIVE

## 2015-05-04 LAB — HIV-1 RNA ULTRAQUANT REFLEX TO GENTYP+: HIV-1 RNA QUANT, LOG: UNDETERMINED {Log_copies}/mL

## 2015-05-04 MED ORDER — EPOETIN ALFA 10000 UNIT/ML IJ SOLN
10000.0000 [IU] | Freq: Once | INTRAMUSCULAR | Status: AC
  Administered 2015-05-04: 10000 [IU] via INTRAVENOUS
  Filled 2015-05-04: qty 1

## 2015-05-04 NOTE — Progress Notes (Signed)
C/O SOB with chest tightness. Dr Jennet Maduro paged and stat ECG ordered. Dr Demetrius Charity to place consult for medicine consult.

## 2015-05-04 NOTE — Progress Notes (Signed)
[facility] LCSW Group Therapy  05/04/2015 2:42 PM  Type of Therapy:  Group Therapy  Participation Level:  Did Not Attend  Participation Quality:    Affect:    Cognitive:    Insight:    Engagement in Therapy:    Modes of Intervention:    Summary of Progress/Problems:  Cheron Schaumann 05/04/2015, 2:42 PM

## 2015-05-04 NOTE — Progress Notes (Signed)
D: Patient stated slept poor last night .Stated appetite is good and energy level  Is normal. Stated concentration is good . Stated on Depression scale 8 , hopeless 10 and anxiety10 .( low 0-10 high) Denies suicidal  homicidal ideations  .  No auditory hallucinations  No pain concerns . Appropriate ADL'S. Interacting with peers and staff.  A: Encourage patient participation with unit programming . Instruction  Given on  Medication , verbalize understanding.Patient had her dialysis treatment this shift . Stated she felt better on return .Bruit and Thrill present . Blood pressure elevated on return . Received medication.  Patient received nebulizer treatment this am.  R: Voice no other concerns. Staff continue to monitor

## 2015-05-04 NOTE — Progress Notes (Signed)
Had c/o SOB that was relieved with HOB elevated while in bed and Albuterol inhaler. Was medication compliant and interacted with peers in mileu. Denies SI, HI, and AVH. Is anxious for dialysis and denies maintaining fluid restriction.

## 2015-05-04 NOTE — Plan of Care (Signed)
Problem: Ineffective individual coping Goal: LTG: Patient will report a decrease in negative feelings Outcome: Progressing Processed feeling with being unable to return to group home

## 2015-05-04 NOTE — [facility] Group Notes (Signed)
[facility] LCSW Aftercare Discharge Planning Group Note  05/04/2015 7:42 AM  Participation Quality:  Attentive  Affect:  Appropriate  Cognitive:  Appropriate  Insight:  Developing/Improving  Engagement in Group:  Engaged  Modes of Intervention:  Discussion, Education and Support  Summary of Progress/Problems:Patients goal was to talk to her nurse as she reported she is feeling unwell  Johnella Moloney, Lougenia Morrissey M 05/04/2015, 7:42 AM

## 2015-05-04 NOTE — Progress Notes (Signed)
[facility]  ROUNDING NOTE   Subjective:  Seen and examined on hemodialysis. Some shortness of breath. UF 2 litres, will challenge.  Objective:  Vital signs in last 24 hours:  Temp:  [98.2 F (36.8 C)] 98.2 F (36.8 C) (07/26 1311) Pulse Rate:  [97-109] 109 (07/26 1500) Resp:  [16-22] 18 (07/26 1500) BP: (153-188)/(80-109) 182/82 mmHg (07/26 1500) SpO2:  [98 %] 98 % (07/26 1300)  Weight change:  Filed Weights   04/28/15 1615 04/28/15 1700  Weight: 114.306 kg (252 lb) 114.306 kg (252 lb)    Intake/Output:     Intake/Output this shift:  Total I/O In: 240 [P.O.:240] Out: -   Physical Exam: General: NAD, sitting up in chair  Head: Normocephalic, atraumatic. Moist oral mucosal membranes  Eyes: Anicteric  Neck: Supple, trachea midline  Lungs:  Clear to auscultation normal effort  Heart: Regular rate and rhythm no rubs  Abdomen:  Soft, nontender, BS present  Extremities:  trace peripheral edema.  Neurologic: Nonfocal, moving all four extremities  Skin: No lesions  Access: LUE AVF    Basic Metabolic Panel:  Recent Labs Lab 04/29/15 1254 05/01/15 1048 05/04/15 1124  NA 136 135 131*  K 4.3 4.6 5.4*  CL 94* 95* 90*  CO2 GLUCOSE 135* 113* 97  BUN 42* 33* 38*  CREATININE 10.53* 8.62* 10.38*  CALCIUM 8.2* 8.1* 8.6*  PHOS 4.2 3.0  3.0 3.6    Liver Function Tests:  Recent Labs Lab 04/29/15 1254 05/01/15 1048 05/04/15 1124  ALBUMIN 3.9 3.5 3.8   No results for input(s): LIPASE, AMYLASE in the last 168 hours. No results for input(s): AMMONIA in the last 168 hours.  CBC:  Recent Labs Lab 04/29/15 1254 05/01/15 1048 05/04/15 1124  WBC 7.3 7.2 7.3  HGB 10.2* 9.7* 9.6*  HCT 30.3* 29.3* 29.0*  MCV 104.1* 104.5* 104.6*  PLT 309 268 240    Cardiac Enzymes: No results for input(s): CKTOTAL, CKMB, CKMBINDEX, TROPONINI in the last 168 hours.  BNP: Invalid input(s): POCBNP  CBG: No results for input(s): GLUCAP in the last 168  hours.  Microbiology: Results for orders placed or performed during the hospital encounter of 03/08/15  MRSA PCR Screening     Status: None   Collection Time: 03/09/15  2:16 AM  Result Value Ref Range Status   MRSA by PCR NEGATIVE NEGATIVE Final    Comment:        The GeneXpert MRSA Assay (FDA approved for NASAL specimens only), is one component of a comprehensive MRSA colonization surveillance program. It is not intended to diagnose MRSA infection nor to guide or monitor treatment for MRSA infections.     Coagulation Studies: No results for input(s): LABPROT, INR in the last 72 hours.  Urinalysis: No results for input(s): COLORURINE, LABSPEC, PHURINE, GLUCOSEU, HGBUR, BILIRUBINUR, KETONESUR, PROTEINUR, UROBILINOGEN, NITRITE, LEUKOCYTESUR in the last 72 hours.  Invalid input(s): APPERANCEUR    Imaging: No results found.   Medications:     . amitriptyline  10 mg Oral QHS  . amLODipine  10 mg Oral Daily  . b complex-vitamin c-folic acid  1 tablet Oral Daily  . diphenhydrAMINE  50 mg Oral QHS  . dolutegravir  50 mg Oral Daily  . feeding supplement (NEPRO CARB STEADY)  237 mL Oral q morning - 10a  . FLUoxetine  20 mg Oral Daily  . hydrALAZINE  25 mg Oral 3 times per day  . lamiVUDine  25 mg Oral Daily  .  pantoprazole  40 mg Oral Daily  . pneumococcal 23 valent vaccine  0.5 mL Intramuscular Tomorrow-1000  . sevelamer carbonate  2,400 mg Oral TID WC  . tenofovir  300 mg Oral Weekly  . tiotropium  18 mcg Inhalation Daily   sodium chloride, sodium chloride, acetaminophen, albuterol, alum & mag hydroxide-simeth, heparin, lidocaine (PF), lidocaine-prilocaine, magnesium hydroxide, nicotine, pentafluoroprop-tetrafluoroeth  Assessment/ Plan:  64 y.o. female with Pmhx of ESRD on HD (not at an established dialysis center, had an issue at [facility], has been going to ED for dialysis at [facility] and [facility]), HIV, substance abuse, anemia of CKD, SHPTH, dperession, COPD,  hypertension, LUE AVF.  1.  ESRD on HD:  Pt doesn't have an established dialysis center, released two years ago from [facility] from an [facility] clinic.  Has been going to ED to obtain dialysis at [facility] and [facility]. - tolerating dialysis well. Will challenge ultrafiltration   2.  Anemia of CKD: hgb 9.6  - continue epogen 10000 units IV with HD.   3.  SHPTH:  Phos 3.6 and acceptable, continue renvela.   4.  Hypertension:   - amlodipine, hydralazine. May need further control.    LOS: 6 [name] 7/26/20163:28 PM

## 2015-05-04 NOTE — Progress Notes (Signed)
HD END 

## 2015-05-04 NOTE — [facility] Group Notes (Signed)
[facility] Group Notes:  (Nursing/MHT/Case Management/Adjunct)  Date:  05/04/2015  Time:  2:07 PM  Type of Therapy:  Psychoeducational Skills  Participation Level:  Did Not Attend    Trevin Gartrell A Dillyn Menna 05/04/2015, 2:07 PM 

## 2015-05-04 NOTE — Progress Notes (Signed)
Recreation Therapy Notes  Date: 07.26.16 Time: 3:00 pm Location: Craft Room  Group Topic: Coping Skills  Goal Area(s) Addresses:  Patient will verbalize an emotion experienced during group. Patient will verbalize benefit of using art as a coping skill.  Behavioral Response: Did not attend  Intervention: Coloring  Activity: Patients were given coloring sheets and instructed to color.   Education: LRT educated patients on benefit of using art as a Associate Professor.  Education Outcome: Patient did not attend group.  Clinical Observations/Feedback: Patient did not attend group.  Jacquelynn Cree, LRT/CTRS 05/04/2015 4:20 PM

## 2015-05-04 NOTE — Progress Notes (Signed)
POST HD 

## 2015-05-04 NOTE — Progress Notes (Signed)
HD START 

## 2015-05-04 NOTE — Progress Notes (Signed)
[facility] MD Progress Note  05/04/2015 5:20 PM Abie Killian  MRN:  [id number]  Subjective:  Ms.Lorenson reports much improvement in her mood. Her affect is brighter. She tolerates medications well. She however, complaints of physical problems. Yesterday morning, last night at 1:00 in the AM and again this morning she had episodes of shortness of breath with chest tightness. She was requesting oxygen. She has not been using oxygen at home. She was consulted by medicine upon admission and no oxygen was recommended. She is in dialysis today feeling better. She is very anxious about her disposition as we've been trying to identify assisted living facility that would accept that the patient on dialysis. She anticipates well in groups. She is easily engaging and very appropriate with peers and staff.  Principal Problem: Major depressive disorder, recurrent, severe without psychotic features Diagnosis:   Patient Active Problem List   Diagnosis Date Noted  . Tobacco use disorder [Z72.0] 04/28/2015  . Cocaine use disorder, moderate, dependence [F14.20] 04/28/2015  . Uremia [N19] 03/12/2015  . Major depressive disorder, recurrent, severe without psychotic features [F33.2] 03/12/2015  . COPD (chronic obstructive pulmonary disease) [J44.9] 03/08/2015  . CKD (chronic kidney disease) requiring chronic dialysis [N18.6, Z99.2] 03/08/2015  . ESRD needing dialysis [N18.6] 02/26/2015  . Vaginal discharge [N89.8]   . Essential hypertension [I10]   . Chronic combined systolic and diastolic congestive heart failure [I50.42]   . Macrocytic anemia [D53.9]   . Chest pain [R07.9]   . HIV disease [B20]   . ESRD on dialysis [N18.6, Z99.2]   . Hyperkalemia [E87.5] 07/12/2014  . Screening examination for venereal disease [Z11.3] 05/13/2014  . Encounter for long-term (current) use of other medications [Z79.899] 05/13/2014  . Respiratory failure [J96.90] 05/08/2014  . Dyspnea [R06.00] 04/21/2014  . Pulmonary edema  [J81.1] 04/21/2014  . Crack cocaine use [F14.90] 01/17/2014  . Osteoarthritis of right hip [M16.11] 12/17/2013  . CCF (congestive cardiac failure) [I50.9] 10/30/2013  . Extreme obesity [E66.01] 10/23/2013  . ESRD (end stage renal disease) on dialysis [N18.6, Z99.2] 09/30/2013  . Anemia secondary to renal failure [D63.1] 02/04/2013  . Compulsive tobacco user syndrome [F17.200] 01/30/2013  . Hyperparathyroidism due to renal insufficiency [N25.81] 01/30/2013  . Anxiety state [F41.1] 01/30/2013  . HIV infection [Z21]   . Hypertension [I10]   . 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 Hypertension   . HIV infection   . Morbid obesity   . ESRD (end stage renal disease) on dialysis 09/30/2013    Started dialysis in [place], [place] around 2009.  ESRD due to HTN vs drug abuse according to pt.  Was on dialysis at [facility] until Feb 2015 when she was admitted to a SNF due to homelessness and drug abuse.  Then changed to [facility] on TTS schedule.     . Anginal pain   . CHF (congestive heart failure)   . Shortness of breath   . Pneumonia   . Depression   . Anxiety   . GERD (gastroesophageal reflux disease)   . Arthritis   . Anemia   . Hyperkalemia 08/11/2014  . Homeless   . CHF (congestive heart failure)     Past Surgical History  Procedure Laterality Date  . Arteriovenous graft placement      left forearm  . Cardiac catheterization    . Laparotomy  states seh was cut open because seh was bleeding on the inside   Family History:  Family History  Problem Relation Age of Onset  . Kidney failure Other     niece  . High blood pressure    . Lung cancer    . Breast cancer Neg Hx   . Colon cancer Neg Hx   . Stroke Mother   . HIV/AIDS Brother     died of AIDS   Social History:  History  Alcohol Use  . 0.6 oz/week  . 1 Cans of beer per  week     History  Drug Use  . 1.00 per week  . Special: Marijuana, "Crack" cocaine    Comment: crack states quit 10/03/13    History   Social History  . Marital Status: Single    Spouse Name: N/A  . Number of Children: N/A  . Years of Education: N/A   Social History Main Topics  . Smoking status: Current Every Day Smoker -- 0.50 packs/day    Types: Cigarettes    Start date: 05/06/2014  . Smokeless tobacco: Never Used     Comment: recently quit  . Alcohol Use: 0.6 oz/week    1 Cans of beer per week  . Drug Use: 1.00 per week    Special: Marijuana, "Crack" cocaine     Comment: crack states quit 10/03/13  . Sexual Activity: No   Other Topics Concern  . None   Social History Narrative   Was recently in [place] and she left without allowing them to arrange for outpatient hemodialysis.  They also were not able to arrange for home health services.        [name] is whom she moved in with currently.  States her niece may be able to help her get to hemodialysis.     Additional History:    Sleep: Fair  Appetite:  Fair   Assessment:   Musculoskeletal: Strength & Muscle Tone: within normal limits Gait & Station: normal Patient leans: N/A   Psychiatric Specialty Exam: Physical Exam  Nursing note and vitals reviewed.   Review of Systems  Respiratory: Positive for shortness of breath.   All other systems reviewed and are negative.   Blood pressure 155/79, pulse 107, temperature 98.2 F (36.8 C), temperature source Oral, resp. rate 16, height 5' 3.78" (1.62 m), weight 114.306 kg (252 lb), SpO2 98 %.Body mass index is 43.56 kg/(m^2).  General Appearance: Casual  Eye Contact::  Good  Speech:  Clear and Coherent  Volume:  Normal  Mood:  Anxious  Affect:  Appropriate  Thought Process:  Goal Directed  Orientation:  Full (Time, Place, and Person)  Thought Content:  WDL  Suicidal Thoughts:  No  Homicidal Thoughts:  No  Memory:  Immediate;    Fair Recent;   Fair Remote;   Fair  Judgement:  Fair  Insight:  Fair  Psychomotor Activity:  Normal  Concentration:  Fair  Recall:  [employer] of Knowledge:Fair  Language: Fair  Akathisia:  No  Handed:  Right  AIMS (if indicated):     Assets:  Communication Skills Desire for Improvement Financial Resources/Insurance  ADL's:  Intact  Cognition: WNL  Sleep:  Number of Hours: 7.25     Current Medications: Current Facility-Administered Medications  Medication Dose Route Frequency Provider Last Rate Last Dose  . 0.9 %  sodium chloride infusion  100 mL Intravenous PRN Munsoor Lateef, MD      .  0.9 %  sodium chloride infusion  100 mL Intravenous PRN Munsoor Lateef, MD      . acetaminophen (TYLENOL) tablet 650 mg  650 mg Oral Q6H PRN Clovis Fredrickson, MD   650 mg at 05/03/15 0951  . albuterol (PROVENTIL) (2.5 MG/3ML) 0.083% nebulizer solution 3 mL  3 mL Inhalation Q6H PRN Clovis Fredrickson, MD   3 mL at 05/04/15 0926  . alum & mag hydroxide-simeth (MAALOX/MYLANTA) 200-200-20 MG/5ML suspension 30 mL  30 mL Oral Q4H PRN Magaby Rumberger B Georgena Weisheit, MD      . amitriptyline (ELAVIL) tablet 10 mg  10 mg Oral QHS Gonzella Lex, MD   10 mg at 05/03/15 2031  . amLODipine (NORVASC) tablet 10 mg  10 mg Oral Daily Munsoor Lateef, MD   10 mg at 05/03/15 0948  . b complex-vitamin c-folic acid (NEPHRO-VITE) tablet 1 tablet  1 tablet Oral Daily Epifanio Lesches, MD   1 tablet at 05/03/15 0947  . diphenhydrAMINE (BENADRYL) capsule 50 mg  50 mg Oral QHS Joylene Wescott B Cathyann Kilfoyle, MD   50 mg at 05/03/15 2030  . dolutegravir (TIVICAY) tablet 50 mg  50 mg Oral Daily Campbell Riches, MD   50 mg at 05/03/15 0947  . feeding supplement (NEPRO CARB STEADY) liquid 237 mL  237 mL Oral q morning - 10a Munsoor Lateef, MD   237 mL at 05/02/15 0916  . FLUoxetine (PROZAC) capsule 20 mg  20 mg Oral Daily Stella Encarnacion B Seymore Brodowski, MD   20 mg at 05/03/15 0947  . heparin injection 1,000 Units  1,000 Units Dialysis PRN Munsoor  Lateef, MD      . hydrALAZINE (APRESOLINE) tablet 25 mg  25 mg Oral 3 times per day Clovis Fredrickson, MD   Stopped at 05/04/15 1442  . lamiVUDine (EPIVIR) 10 MG/ML solution 25 mg  25 mg Oral Daily Haileyann Staiger B Derriana Oser, MD   25 mg at 05/03/15 0948  . lidocaine (PF) (XYLOCAINE) 1 % injection 5 mL  5 mL Intradermal PRN Munsoor Lateef, MD      . lidocaine-prilocaine (EMLA) cream 1 application  1 application Topical PRN Munsoor Lateef, MD      . magnesium hydroxide (MILK OF MAGNESIA) suspension 30 mL  30 mL Oral Daily PRN Jozlin Bently B Siddhi Dornbush, MD      . nicotine (NICOTROL) 10 MG inhaler 1 continuous puffing  1 continuous puffing Inhalation PRN Clovis Fredrickson, MD   1 continuous puffing at 05/01/15 1825  . pantoprazole (PROTONIX) EC tablet 40 mg  40 mg Oral Daily Clovis Fredrickson, MD   Stopped at 05/04/15 1037  . pentafluoroprop-tetrafluoroeth (GEBAUERS) aerosol 1 application  1 application Topical PRN Munsoor Lateef, MD      . pneumococcal 23 valent vaccine (PNU-IMMUNE) injection 0.5 mL  0.5 mL Intramuscular Tomorrow-1000 Clovis Fredrickson, MD   Stopped at 05/04/15 1037  . sevelamer carbonate (RENVELA) tablet 2,400 mg  2,400 mg Oral TID WC Epifanio Lesches, MD   Stopped at 05/04/15 1248  . tenofovir (VIREAD) tablet 300 mg  300 mg Oral Weekly Epifanio Lesches, MD   300 mg at 04/30/15 1033  . tiotropium (SPIRIVA) inhalation capsule 18 mcg  18 mcg Inhalation Daily Clovis Fredrickson, MD   18 mcg at 05/04/15 3748   Facility-Administered Medications Ordered in Other Encounters  Medication Dose Route Frequency Provider Last Rate Last Dose  . 0.9 %  sodium chloride infusion  100 mL Intravenous PRN Shelle Iron, NP      .  0.9 %  sodium chloride infusion  100 mL Intravenous PRN Shelle Iron, NP      . heparin injection 2,300 Units  20 Units/kg Dialysis Once in dialysis Shelle Iron, NP        Lab Results:  Results for orders placed or performed during the hospital encounter of  04/28/15 (from the past 48 hour(s))  Renal function panel     Status: Abnormal   Collection Time: 05/04/15 11:24 AM  Result Value Ref Range   Sodium 131 (L) 135 - 145 mmol/L   Potassium 5.4 (H) 3.5 - 5.1 mmol/L   Chloride 90 (L) 101 - 111 mmol/L   CO2 27 22 - 32 mmol/L   Glucose, Bld 97 65 - 99 mg/dL   BUN 38 (H) 6 - 20 mg/dL   Creatinine, Ser 10.38 (H) 0.44 - 1.00 mg/dL   Calcium 8.6 (L) 8.9 - 10.3 mg/dL   Phosphorus 3.6 2.5 - 4.6 mg/dL   Albumin 3.8 3.5 - 5.0 g/dL   GFR calc non Af Amer 3 (L) >60 mL/min   GFR calc Af Amer 4 (L) >60 mL/min    Comment: (NOTE) The eGFR has been calculated using the CKD EPI equation. This calculation has not been validated in all clinical situations. eGFR's persistently <60 mL/min signify possible Chronic Kidney Disease.    Anion gap 14 5 - 15  CBC     Status: Abnormal   Collection Time: 05/04/15 11:24 AM  Result Value Ref Range   WBC 7.3 3.6 - 11.0 K/uL   RBC 2.77 (L) 3.80 - 5.20 MIL/uL   Hemoglobin 9.6 (L) 12.0 - 16.0 g/dL   HCT 29.0 (L) 35.0 - 47.0 %   MCV 104.6 (H) 80.0 - 100.0 fL   MCH 34.7 (H) 26.0 - 34.0 pg   MCHC 33.1 32.0 - 36.0 g/dL   RDW 14.3 11.5 - 14.5 %   Platelets 240 150 - 440 K/uL    Physical Findings: AIMS:  , ,  ,  ,    CIWA:    COWS:     Treatment Plan Summary: Daily contact with patient to assess and evaluate symptoms and progress in treatment and Medication management   Medical Decision Making:  Established Problem, Stable/Improving (1), Review of Psycho-Social Stressors (1), Review or order clinical lab tests (1), Review of Medication Regimen & Side Effects (2) and Review of New Medication or Change in Dosage (2)   Ms. Math is a 63 year old female with history of depression and multiple medical problems admitted for suicidal ideation in the context of homelessness and treatment noncompliance.  1. Suicidal ideation. The patient is able to contract for safety in the hospital.  2. Mood. She reports that Prozac  has been helpful in the past she was started on 20 mg of Prozac.  3. End-stage kidney disease on dialysis. She resumed dialysis under the care of Dr. Holley Raring.   4. HIV. She has not been compliant with her treatment lately. She was restarted on medications by Dr. Johnnye Sima.   5. COPD and hypertension. Followed by medicine. Will continue inhalers. On admission her blood pressure was 151/131.   6. Smoking. Nicotine products aren't available.  7. Substance abuse. She reports she relapsed on crack last weekend after several months of sobriety.  8. Social. She is homeless but has resources to be placed in assisted living facility. She would like to live in [place] where her son now resides  85. Insomnia. Benadryl 50 mg nightly. I  am also adding 7.5 mg of Restoril at night for sleep  10. Disposition. To be established     Shron Ozer 05/04/2015, 5:20 PM

## 2015-05-04 NOTE — Progress Notes (Signed)
PRE HD   

## 2015-05-05 LAB — HELPER T-LYMPH-CD4 (ARMC ONLY)
% CD 4 Pos. Lymph.: 41.8 % (ref 30.8–58.5)
Absolute CD 4 Helper: 920 /uL (ref 359–1519)
BASOS ABS: 0 10*3/uL (ref 0.0–0.2)
Basos: 0 %
EOS (ABSOLUTE): 0 10*3/uL (ref 0.0–0.4)
Eos: 0 %
HEMOGLOBIN: 9.5 g/dL — AB (ref 11.1–15.9)
Hematocrit: 27.7 % — ABNORMAL LOW (ref 34.0–46.6)
IMMATURE GRANS (ABS): 0 10*3/uL (ref 0.0–0.1)
Immature Granulocytes: 0 %
Lymphocytes Absolute: 2.2 10*3/uL (ref 0.7–3.1)
Lymphs: 30 %
MCH: 35.1 pg — AB (ref 26.6–33.0)
MCHC: 34.3 g/dL (ref 31.5–35.7)
MCV: 102 fL — ABNORMAL HIGH (ref 79–97)
MONOCYTES: 9 %
MONOS ABS: 0.7 10*3/uL (ref 0.1–0.9)
Neutrophils Absolute: 4.4 10*3/uL (ref 1.4–7.0)
Neutrophils: 61 %
PLATELETS: 266 10*3/uL (ref 150–379)
RBC: 2.71 x10E6/uL — ABNORMAL LOW (ref 3.77–5.28)
RDW: 14.2 % (ref 12.3–15.4)
WBC: 7.3 10*3/uL (ref 3.4–10.8)

## 2015-05-05 MED ORDER — DIPHENHYDRAMINE HCL 50 MG PO CAPS: 50.0000 mg | ORAL_CAPSULE | Freq: Every day | ORAL | Status: AC

## 2015-05-05 MED ORDER — TIOTROPIUM BROMIDE MONOHYDRATE 18 MCG IN CAPS: 18.0000 ug | ORAL_CAPSULE | Freq: Every day | RESPIRATORY_TRACT | Status: AC

## 2015-05-05 MED ORDER — SEVELAMER CARBONATE 800 MG PO TABS: 2400.0000 mg | ORAL_TABLET | Freq: Three times a day (TID) | ORAL | Status: AC

## 2015-05-05 MED ORDER — CALCITRIOL 0.5 MCG PO CAPS: 2.0000 ug | ORAL_CAPSULE | Freq: Every day | ORAL | Status: AC

## 2015-05-05 MED ORDER — AMLODIPINE BESYLATE 5 MG PO TABS: 10.0000 mg | ORAL_TABLET | Freq: Every day | ORAL | Status: AC

## 2015-05-05 MED ORDER — RALTEGRAVIR POTASSIUM 400 MG PO TABS: 400.0000 mg | ORAL_TABLET | Freq: Two times a day (BID) | ORAL | Status: AC

## 2015-05-05 MED ORDER — FLUOXETINE HCL 20 MG PO CAPS: 20.0000 mg | ORAL_CAPSULE | Freq: Every day | ORAL | Status: AC

## 2015-05-05 MED ORDER — ALBUTEROL SULFATE HFA 108 (90 BASE) MCG/ACT IN AERS: 2.0000 | INHALATION_SPRAY | Freq: Four times a day (QID) | RESPIRATORY_TRACT | Status: AC | PRN

## 2015-05-05 MED ORDER — LAMIVUDINE 10 MG/ML PO SOLN: 25.0000 mg | Freq: Every day | ORAL | Status: AC

## 2015-05-05 MED ORDER — TENOFOVIR DISOPROXIL FUMARATE 300 MG PO TABS: 300.0000 mg | ORAL_TABLET | ORAL | Status: AC

## 2015-05-05 MED ORDER — NEPHRO-VITE 0.8 MG PO TABS: 1.0000 | ORAL_TABLET | Freq: Every day | ORAL | Status: AC

## 2015-05-05 MED ORDER — CINACALCET HCL 60 MG PO TABS: 60.0000 mg | ORAL_TABLET | Freq: Two times a day (BID) | ORAL | Status: AC

## 2015-05-05 MED ORDER — HYDRALAZINE HCL 25 MG PO TABS: 25.0000 mg | ORAL_TABLET | Freq: Three times a day (TID) | ORAL | Status: AC

## 2015-05-05 MED ORDER — PANTOPRAZOLE SODIUM 40 MG PO TBEC: 40.0000 mg | DELAYED_RELEASE_TABLET | Freq: Every day | ORAL | Status: AC

## 2015-05-05 MED ORDER — DOLUTEGRAVIR SODIUM 50 MG PO TABS: 50.0000 mg | ORAL_TABLET | Freq: Every day | ORAL | Status: AC

## 2015-05-05 NOTE — [facility] Group Notes (Signed)
[facility] Group Notes:  (Nursing/MHT/Case Management/Adjunct)  Date:  05/05/2015  Time:  12:38 PM  Type of Therapy:  Psychoeducational Skills  Participation Level:  Active  Participation Quality:  Appropriate  Affect:  Appropriate  Cognitive:  Appropriate  Insight:  Appropriate  Engagement in Group:  Engaged  Modes of Intervention:  Discussion, Education and Support  Summary of Progress/Problems:  Lynelle Smoke [facility] 05/05/2015, 12:38 PM

## 2015-05-05 NOTE — Progress Notes (Signed)
Recreation Therapy Notes  Date: 07.27.16 Time: 3:00 pm Location: Craft Room  Group Topic: Self-esteem  Goal Area(s) Addresses:  Patient will identify positive attributes about self. Patient will identify at least one coping skill.  Behavioral Response: Attentive  Intervention: All About Me  Activity: Patients were instructed to make an "All About Me" pamphlet with their life's motto, positive traits, coping skills, and their healthy support system.  Education: LRT educated patients on ways to increase their self-esteem.   Education Outcome: In group clarification offered  Clinical Observations/Feedback: Patient completed activity. Patient did not contribute to group discussion.  Jacquelynn Cree, LRT/CTRS 05/05/2015 4:22 PM

## 2015-05-05 NOTE — Progress Notes (Signed)
D: Patient continue to progress toward treatment in less restrictive level of care. Patient denies SI or HI . B/P continues to be  monitored. Patient in attending  group apporpately interacting with staff and peers.   A: Patient continues to on 15 minute safety checks. Receptive to redirection and feedback R: Patient safety maintain

## 2015-05-05 NOTE — Progress Notes (Signed)
D: Pt denies SI/HI/AVH. Pt is pleasant and cooperative, affect is cheerful and mood is happy, denies pain and discomfort, patient is S/P dialysis today patient is alert and oriented x4 blood pressure was on the high side administred blood pressure medication as prescribed.  Pt  denies anxiety and is interacting with peers and staff appropriately.  A: Pt was offered support and encouragement. Pt was given scheduled medications. Pt was encouraged to attend groups. Q 15 minute checks were done for safety.  R:Pt attends groups and interacts well with peers and staff. Pt is taking medication. Pt has no complaints.Pt receptive to treatment and safety maintained on unit.

## 2015-05-05 NOTE — Progress Notes (Addendum)
MD on call notified of patient's  VS; increase in blood pressure and temperature. Patent verbalized feeling tired and hot. Patient is S/P dialysis and her blood pressure has been running high. Dr Mayford Knife advised/ gave orders to give tylenol 650 mg for the temperature and get a urinalysis C&S, he advised not to give anything at this time for the blood pressure that she needs to be reevaluated by the MD in the morning, will continue to monitor.

## 2015-05-05 NOTE — Progress Notes (Addendum)
[facility] LCSW Aftercare Discharge Planning Group Note  05/05/2015 1:36 PM  Participation Quality:  Did not attend  Affect:    Cognitive:    Insight:    Engagement in Group:    Modes of Intervention:    Summary of Progress/Problems:  Cheron Schaumann 05/05/2015, 1:36 PM

## 2015-05-05 NOTE — Tx Team (Signed)
Interdisciplinary Treatment Plan Update (Adult)  Date:  05/05/2015 Time Reviewed:  1:38 PM  Progress in Treatment: Attending groups: Yes. Participating in groups:  Yes. Taking medication as prescribed:  Yes. Tolerating medication:  Yes. Family/Significant othe contact made:  No, will contact:  if patient provides consent Patient understands diagnosis:  Yes. Discussing patient identified problems/goals with staff:  Yes. Medical problems stabilized or resolved:  No. Continues need for dialysis Denies suicidal/homicidal ideation: Yes. Issues/concerns per patient self-inventory:  No. Other:  New problem(s) identified: Yes, Describe:  homeless and needs community outpatient dialysis  Discharge Plan or Barriers: Homeless and resources depleted. Patient has PASRR and is awaiting placement for safe disposition and dialysis treatment to be coordinated. Patient has been refused dialysis treatment in [place] and will continue to seek services for patient.   Reason for Continuation of Hospitalization: Depression, to plan a safe discharge that addresses her medical needs as well  Comments:   Estimated length of stay: up to 2 days  New goal(s):   Review of initial/current patient goals per problem list:   See Care Plan  Attendees: Physician:  Kristine Linea, MD  7/27/20161:38 PM  Nursing:  Shelia Media, RN  7/27/20161:38 PM  Other:  Beryl Meager, LCSWA 7/27/20161:38 PM  Other:  Jake Shark, LCSW 7/27/20161:38 PM  Other: Hershal Coria, LRT 7/27/20161:38 PM  Other:  7/27/20161:38 PM  Other:  7/27/20161:38 PM  Other:  7/27/20161:38 PM  Other:  7/27/20161:38 PM  Other:  7/27/20161:38 PM  Other:   7/27/20161:38 PM   Scribe for Treatment Team:   Lulu Riding, MSW, LCSWA  05/05/2015, 1:38 PM

## 2015-05-05 NOTE — Plan of Care (Signed)
Problem: Alteration in mood Goal: LTG-Patient reports reduction in suicidal thoughts (Patient reports reduction in suicidal thoughts and is able to verbalize a safety plan for whenever patient is feeling suicidal)  Patient denies SI/HI, interacting appropriately with peers and staff, attending unit program.

## 2015-05-05 NOTE — Progress Notes (Signed)
[facility] MD Progress Note  05/05/2015 4:02 PM [name]  MRN:  [id number]  Subjective: [name] continues to improve her mood is good affect bright. She does not have any somatic complaints today. As she was short of breath yesterday but it all resolved after dialysis. Her blood pressure is slightly elevated and the nephrologist will likely adjust her blood pressure medication. She denies thoughts of hurting herself or others. Sleep and appetite are okay. She eagerly participates in programming. She is rather anxious about her disposition as we have not heard from assisted living facility and yet. The plan is to place her. PASSAR or in place. TB ruled out.  Principal Problem: Major depressive disorder, recurrent, severe without psychotic features Diagnosis:   Patient Active Problem List   Diagnosis Date Noted  . Tobacco use disorder [Z72.0] 04/28/2015  . Cocaine use disorder, moderate, dependence [F14.20] 04/28/2015  . Uremia [N19] 03/12/2015  . Major depressive disorder, recurrent, severe without psychotic features [F33.2] 03/12/2015  . COPD (chronic obstructive pulmonary disease) [J44.9] 03/08/2015  . CKD (chronic kidney disease) requiring chronic dialysis [N18.6, Z99.2] 03/08/2015  . ESRD needing dialysis [N18.6] 02/26/2015  . Vaginal discharge [N89.8]   . Essential hypertension [I10]   . Chronic combined systolic and diastolic congestive heart failure [I50.42]   . Macrocytic anemia [D53.9]   . Chest pain [R07.9]   . HIV disease [B20]   . ESRD on dialysis [N18.6, Z99.2]   . Hyperkalemia [E87.5] 07/12/2014  . Screening examination for venereal disease [Z11.3] 05/13/2014  . Encounter for long-term (current) use of other medications [Z79.899] 05/13/2014  . Respiratory failure [J96.90] 05/08/2014  . Dyspnea [R06.00] 04/21/2014  . Pulmonary edema [J81.1] 04/21/2014  . Crack cocaine use [F14.90] 01/17/2014  . Osteoarthritis of right hip [M16.11] 12/17/2013  . CCF (congestive  cardiac failure) [I50.9] 10/30/2013  . Extreme obesity [E66.01] 10/23/2013  . ESRD (end stage renal disease) on dialysis [N18.6, Z99.2] 09/30/2013  . Anemia secondary to renal failure [D63.1] 02/04/2013  . Compulsive tobacco user syndrome [F17.200] 01/30/2013  . Hyperparathyroidism due to renal insufficiency [N25.81] 01/30/2013  . Anxiety state [F41.1] 01/30/2013  . HIV infection [Z21]   . Hypertension [I10]   . 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 Hypertension   . HIV infection   . Morbid obesity   . ESRD (end stage renal disease) on dialysis 09/30/2013    Started dialysis in [place], [place] around 2009.  ESRD due to HTN vs drug abuse according to pt.  Was on dialysis at [facility] until Feb 2015 when she was admitted to a SNF due to homelessness and drug abuse.  Then changed to [facility] on TTS schedule.     . Anginal pain   . CHF (congestive heart failure)   . Shortness of breath   . Pneumonia   . Depression   . Anxiety   . GERD (gastroesophageal reflux disease)   . Arthritis   . Anemia   . Hyperkalemia 08/11/2014  . Homeless   . CHF (congestive heart failure)     Past Surgical History  Procedure Laterality Date  . Arteriovenous graft placement      left forearm  . Cardiac catheterization    . Laparotomy      states seh was cut open because seh was bleeding on the inside   Family  History:  Family History  Problem Relation Age of Onset  . Kidney failure Other     niece  . High blood pressure    . Lung cancer    . Breast cancer Neg Hx   . Colon cancer Neg Hx   . Stroke Mother   . HIV/AIDS Brother     died of AIDS   Social History:  History  Alcohol Use  . 0.6 oz/week  . 1 Cans of beer per week     History  Drug Use  . 1.00 per week  . Special: Marijuana, "Crack" cocaine    Comment: crack states quit 10/03/13     History   Social History  . Marital Status: Single    Spouse Name: N/A  . Number of Children: N/A  . Years of Education: N/A   Social History Main Topics  . Smoking status: Current Every Day Smoker -- 0.50 packs/day    Types: Cigarettes    Start date: 05/06/2014  . Smokeless tobacco: Never Used     Comment: recently quit  . Alcohol Use: 0.6 oz/week    1 Cans of beer per week  . Drug Use: 1.00 per week    Special: Marijuana, "Crack" cocaine     Comment: crack states quit 10/03/13  . Sexual Activity: No   Other Topics Concern  . None   Social History Narrative   Was recently in [place] and she left without allowing them to arrange for outpatient hemodialysis.  They also were not able to arrange for home health services.        [name] is whom she moved in with currently.  States her niece may be able to help her get to hemodialysis.     Additional History:    Sleep: Fair  Appetite:  Fair   Assessment:   Musculoskeletal: Strength & Muscle Tone: within normal limits Gait & Station: normal Patient leans: N/A   Psychiatric Specialty Exam: Physical Exam  Nursing note and vitals reviewed.   Review of Systems  Respiratory: Positive for shortness of breath.   All other systems reviewed and are negative.   Blood pressure 185/98, pulse 104, temperature 98.6 F (37 C), temperature source Oral, resp. rate 20, height 5' 3.78" (1.62 m), weight 114.306 kg (252 lb), SpO2 98 %.Body mass index is 43.56 kg/(m^2).  General Appearance: Casual  Eye Contact::  Good  Speech:  Clear and Coherent  Volume:  Normal  Mood:  Euthymic  Affect:  Appropriate  Thought Process:  Goal Directed  Orientation:  Full (Time, Place, and Person)  Thought Content:  WDL  Suicidal Thoughts:  No  Homicidal Thoughts:  No  Memory:  Immediate;   Fair Recent;   Fair Remote;   Fair  Judgement:  Fair  Insight:  Fair  Psychomotor Activity:  Normal  Concentration:  Fair  Recall:   [employer] of Knowledge:Fair  Language: Fair  Akathisia:  No  Handed:  Right  AIMS (if indicated):     Assets:  Communication Skills Desire for Improvement Financial Resources/Insurance  ADL's:  Intact  Cognition: WNL  Sleep:  Number of Hours: 4.25     Current Medications: Current Facility-Administered Medications  Medication Dose Route Frequency Provider Last Rate Last Dose  . 0.9 %  sodium chloride infusion  100 mL Intravenous PRN Munsoor Lateef, MD      . 0.9 %  sodium chloride infusion  100 mL Intravenous PRN Munsoor Lateef, MD      .  acetaminophen (TYLENOL) tablet 650 mg  650 mg Oral Q6H PRN Clovis Fredrickson, MD   650 mg at 05/05/15 0336  . albuterol (PROVENTIL) (2.5 MG/3ML) 0.083% nebulizer solution 3 mL  3 mL Inhalation Q6H PRN Clovis Fredrickson, MD   3 mL at 05/04/15 0926  . alum & mag hydroxide-simeth (MAALOX/MYLANTA) 200-200-20 MG/5ML suspension 30 mL  30 mL Oral Q4H PRN Castor Gittleman B Hermes Wafer, MD      . amitriptyline (ELAVIL) tablet 10 mg  10 mg Oral QHS Gonzella Lex, MD   10 mg at 05/04/15 2119  . amLODipine (NORVASC) tablet 10 mg  10 mg Oral Daily Munsoor Lateef, MD   10 mg at 05/05/15 1013  . b complex-vitamin c-folic acid (NEPHRO-VITE) tablet 1 tablet  1 tablet Oral Daily Epifanio Lesches, MD   1 tablet at 05/05/15 1014  . diphenhydrAMINE (BENADRYL) capsule 50 mg  50 mg Oral QHS Anees Vanecek B Alecxander Mainwaring, MD   50 mg at 05/04/15 2118  . dolutegravir (TIVICAY) tablet 50 mg  50 mg Oral Daily Campbell Riches, MD   50 mg at 05/05/15 1015  . feeding supplement (NEPRO CARB STEADY) liquid 237 mL  237 mL Oral q morning - 10a Munsoor Lateef, MD   237 mL at 05/05/15 1016  . FLUoxetine (PROZAC) capsule 20 mg  20 mg Oral Daily Zealand Boyett B Bethanie Bloxom, MD   20 mg at 05/05/15 1015  . heparin injection 1,000 Units  1,000 Units Dialysis PRN Munsoor Lateef, MD   1,000 Units at 05/05/15 1430  . hydrALAZINE (APRESOLINE) tablet 25 mg  25 mg Oral 3 times per day Clovis Fredrickson, MD    25 mg at 05/05/15 1508  . lamiVUDine (EPIVIR) 10 MG/ML solution 25 mg  25 mg Oral Daily Tee Richeson B Blayke Cordrey, MD   25 mg at 05/05/15 1016  . lidocaine (PF) (XYLOCAINE) 1 % injection 5 mL  5 mL Intradermal PRN Munsoor Lateef, MD      . lidocaine-prilocaine (EMLA) cream 1 application  1 application Topical PRN Munsoor Lateef, MD      . magnesium hydroxide (MILK OF MAGNESIA) suspension 30 mL  30 mL Oral Daily PRN Jonnathan Birman B Michaelann Gunnoe, MD      . nicotine (NICOTROL) 10 MG inhaler 1 continuous puffing  1 continuous puffing Inhalation PRN Clovis Fredrickson, MD   1 continuous puffing at 05/01/15 1825  . pantoprazole (PROTONIX) EC tablet 40 mg  40 mg Oral Daily Louvina Cleary B Jaylin Roundy, MD   40 mg at 05/05/15 1015  . pentafluoroprop-tetrafluoroeth (GEBAUERS) aerosol 1 application  1 application Topical PRN Munsoor Lateef, MD      . pneumococcal 23 valent vaccine (PNU-IMMUNE) injection 0.5 mL  0.5 mL Intramuscular Tomorrow-1000 Shed Nixon B Emmitt Matthews, MD   0 mL at 05/04/15 1037  . sevelamer carbonate (RENVELA) tablet 2,400 mg  2,400 mg Oral TID WC Epifanio Lesches, MD   2,400 mg at 05/05/15 1155  . tenofovir (VIREAD) tablet 300 mg  300 mg Oral Weekly Epifanio Lesches, MD   300 mg at 04/30/15 1033  . tiotropium (SPIRIVA) inhalation capsule 18 mcg  18 mcg Inhalation Daily Clovis Fredrickson, MD   18 mcg at 05/05/15 9774   Facility-Administered Medications Ordered in Other Encounters  Medication Dose Route Frequency Provider Last Rate Last Dose  . 0.9 %  sodium chloride infusion  100 mL Intravenous PRN Shelle Iron, NP      . 0.9 %  sodium chloride infusion  100 mL Intravenous PRN Shelton Silvas  Orvil Feil, NP      . heparin injection 2,300 Units  20 Units/kg Dialysis Once in dialysis Shelle Iron, NP        Lab Results:  Results for orders placed or performed during the hospital encounter of 04/28/15 (from the past 48 hour(s))  HELPER [id number]     Status: Abnormal   Collection Time: 05/04/15  6:35 AM   Result Value Ref Range   Absolute CD 4 Helper 920 359 - 1519 /uL   % CD 4 Pos. Lymph. 41.8 30.8 - 58.5 %   WBC 7.3 3.4 - 10.8 x10E3/uL   RBC 2.71 (L) 3.77 - 5.28 x10E6/uL   Hematocrit 27.7 (L) 34.0 - 46.6 %   MCV 102 (H) 79 - 97 fL   MCH 35.1 (H) 26.6 - 33.0 pg   MCHC 34.3 31.5 - 35.7 g/dL   RDW 14.2 12.3 - 15.4 %   Platelets 266 150 - 379 x10E3/uL   Neutrophils 61 %   Lymphs 30 %   Monocytes 9 %   Eos 0 %   Basos 0 %   Neutrophils Absolute 4.4 1.4 - 7.0 x10E3/uL   Lymphocytes Absolute 2.2 0.7 - 3.1 x10E3/uL   Monocytes Absolute 0.7 0.1 - 0.9 x10E3/uL   EOS (ABSOLUTE) 0.0 0.0 - 0.4 x10E3/uL   Basophils Absolute 0.0 0.0 - 0.2 x10E3/uL   Immature Granulocytes 0 %   Immature Grans (Abs) 0.0 0.0 - 0.1 x10E3/uL    Comment: (NOTE) Performed At: [facility] [place], [place] [id number] Lindon Romp MD [id number]    Hemoglobin 9.5 (L) 11.1 - 15.9 g/dL  Renal function panel     Status: Abnormal   Collection Time: 05/04/15 11:24 AM  Result Value Ref Range   Sodium 131 (L) 135 - 145 mmol/L   Potassium 5.4 (H) 3.5 - 5.1 mmol/L   Chloride 90 (L) 101 - 111 mmol/L   CO2 27 22 - 32 mmol/L   Glucose, Bld 97 65 - 99 mg/dL   BUN 38 (H) 6 - 20 mg/dL   Creatinine, Ser 10.38 (H) 0.44 - 1.00 mg/dL   Calcium 8.6 (L) 8.9 - 10.3 mg/dL   Phosphorus 3.6 2.5 - 4.6 mg/dL   Albumin 3.8 3.5 - 5.0 g/dL   GFR calc non Af Amer 3 (L) >60 mL/min   GFR calc Af Amer 4 (L) >60 mL/min    Comment: (NOTE) The eGFR has been calculated using the CKD EPI equation. This calculation has not been validated in all clinical situations. eGFR's persistently <60 mL/min signify possible Chronic Kidney Disease.    Anion gap 14 5 - 15  CBC     Status: Abnormal   Collection Time: 05/04/15 11:24 AM  Result Value Ref Range   WBC 7.3 3.6 - 11.0 K/uL   RBC 2.77 (L) 3.80 - 5.20 MIL/uL   Hemoglobin 9.6 (L) 12.0 - 16.0 g/dL   HCT 29.0 (L) 35.0 - 47.0 %   MCV 104.6 (H) 80.0 - 100.0 fL   MCH  34.7 (H) 26.0 - 34.0 pg   MCHC 33.1 32.0 - 36.0 g/dL   RDW 14.3 11.5 - 14.5 %   Platelets 240 150 - 440 K/uL    Physical Findings: AIMS:  , ,  ,  ,    CIWA:    COWS:     Treatment Plan Summary: Daily contact with patient to assess and evaluate symptoms and progress in treatment and Medication management   Medical Decision  Making:  Established Problem, Stable/Improving (1), Review of Psycho-Social Stressors (1), Review or order clinical lab tests (1), Review of Medication Regimen & Side Effects (2) and Review of New Medication or Change in Dosage (2)   [name] is a 64 year old female with history of depression and multiple medical problems admitted for suicidal ideation in the context of homelessness and treatment noncompliance.  1. Suicidal ideation. The patient is able to contract for safety in the hospital.  2. Mood. She reports that Prozac has been helpful in the past she was started on 20 mg of Prozac.  3. End-stage kidney disease on dialysis. She resumed dialysis under the care of Dr. Holley Raring.   4. HIV. She has not been compliant with her treatment lately. She was restarted on medications by Dr. Johnnye Sima.   5. COPD and hypertension. Followed by medicine. Will continue inhalers. On admission her blood pressure was 151/131.   6. Smoking. Nicotine products aren't available.  7. Substance abuse. She reports she relapsed on crack last weekend after several months of sobriety.  8. Social. She is homeless but has resources to be placed in assisted living facility. She would like to live in [place] where her son now resides  49. Insomnia. Benadryl 50 mg nightly. I am also adding 7.5 mg of Restoril at night for sleep  10. Disposition. To be established     Rahaf Carbonell 05/05/2015, 4:02 PM
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
## 2015-05-05 NOTE — Progress Notes (Signed)
D; Assessed patient shunt. A: Shunt feels and sounds appropriate,  R:  patient have no c/o problem or discomfort

## 2015-05-05 NOTE — Plan of Care (Signed)
Problem: Consults Goal: [facility] General Treatment Patient Education Outcome: Progressing Educated on medication, and blood pressure, patient receptive denies SI and HI

## 2015-05-05 NOTE — [facility] Group Notes (Signed)
[facility] LCSW Group Therapy  05/05/2015 4:07 PM  Type of Therapy:  Group Therapy  Participation Level:  Minimal  Participation Quality:  Attentive  Affect:  Depressed  Cognitive:  Appropriate  Insight:  Improving  Engagement in Therapy:  Improving  Modes of Intervention:  Discussion, Education, Socialization and Support  Summary of Progress/Problems: Emotional Regulation: Patients will identify both negative and positive emotions. They will discuss emotions they have difficulty regulating and how they impact their lives. Patients will be asked to identify healthy coping skills to combat unhealthy reactions to negative emotions. Camiya expressed feeling worried about not being accepted into an ALF.   [employer] MSW, LCSWA  05/05/2015, 4:07 PM

## 2015-05-06 LAB — CBC
HCT: 27.2 % — ABNORMAL LOW (ref 35.0–47.0)
Hemoglobin: 9 g/dL — ABNORMAL LOW (ref 12.0–16.0)
MCH: 34.7 pg — AB (ref 26.0–34.0)
MCHC: 33.2 g/dL (ref 32.0–36.0)
MCV: 104.4 fL — ABNORMAL HIGH (ref 80.0–100.0)
Platelets: 207 10*3/uL (ref 150–440)
RBC: 2.6 MIL/uL — ABNORMAL LOW (ref 3.80–5.20)
RDW: 14.1 % (ref 11.5–14.5)
WBC: 6.3 10*3/uL (ref 3.6–11.0)

## 2015-05-06 LAB — RENAL FUNCTION PANEL
Albumin: 3.7 g/dL (ref 3.5–5.0)
Anion gap: 12 (ref 5–15)
BUN: 26 mg/dL — ABNORMAL HIGH (ref 6–20)
CO2: 28 mmol/L (ref 22–32)
CREATININE: 8.42 mg/dL — AB (ref 0.44–1.00)
Calcium: 8.7 mg/dL — ABNORMAL LOW (ref 8.9–10.3)
Chloride: 92 mmol/L — ABNORMAL LOW (ref 101–111)
GFR calc Af Amer: 5 mL/min — ABNORMAL LOW (ref >60)
GFR, EST NON AFRICAN AMERICAN: 4 mL/min — AB (ref >60)
Glucose, Bld: 107 mg/dL — ABNORMAL HIGH (ref 65–99)
Phosphorus: 3.3 mg/dL (ref 2.5–4.6)
Potassium: 5 mmol/L (ref 3.5–5.1)
SODIUM: 132 mmol/L — AB (ref 135–145)

## 2015-05-06 MED ORDER — EPOETIN ALFA 10000 UNIT/ML IJ SOLN
10000.0000 [IU] | Freq: Once | INTRAMUSCULAR | Status: AC
  Administered 2015-05-06: 10000 [IU] via INTRAVENOUS
  Filled 2015-05-06: qty 1

## 2015-05-06 MED ORDER — METOPROLOL TARTRATE 25 MG PO TABS: 25.0000 mg | ORAL_TABLET | Freq: Two times a day (BID) | ORAL | Status: AC

## 2015-05-06 MED ORDER — TUBERCULIN PPD 5 UNIT/0.1ML ID SOLN
5.0000 [IU] | Freq: Once | INTRADERMAL | Status: DC
  Administered 2015-05-06: 5 [IU] via INTRADERMAL
  Filled 2015-05-06: qty 0.1

## 2015-05-06 MED ORDER — METOPROLOL TARTRATE 25 MG PO TABS
25.0000 mg | ORAL_TABLET | Freq: Two times a day (BID) | ORAL | Status: DC
  Administered 2015-05-06: 25 mg via ORAL
  Filled 2015-05-06: qty 1

## 2015-05-06 NOTE — Plan of Care (Signed)
Problem: Alteration in mood Goal: STG-Pt Able to Identify Plan For Continuing Care at D/C Pt. Will be able to identify a plan for continuing care at discharge  Outcome: Progressing Patient  Receiving treatment off unit, scheduled for discharge to  Return to previous living arrangements.

## 2015-05-06 NOTE — [facility] Suicide Risk Assessment (Signed)
[facility] Discharge Suicide Risk Assessment   Demographic Factors:  NA  Total Time spent with patient: 30 minutes  Musculoskeletal: Strength & Muscle Tone: within normal limits Gait & Station: normal Patient leans: N/A  Psychiatric Specialty Exam: Physical Exam  Nursing note and vitals reviewed.   Review of Systems  Respiratory: Positive for shortness of breath.   All other systems reviewed and are negative.   Blood pressure 179/96, pulse 65, temperature 97.7 F (36.5 C), temperature source Oral, resp. rate 17, height 5' 3.78" (1.62 m), weight 114.306 kg (252 lb), SpO2 98 %.Body mass index is 43.56 kg/(m^2).  General Appearance: Casual  Eye Contact::  Good  Speech:  Clear and Coherent409  Volume:  Normal  Mood:  Euthymic  Affect:  Appropriate  Thought Process:  Goal Directed  Orientation:  Full (Time, Place, and Person)  Thought Content:  WDL  Suicidal Thoughts:  No  Homicidal Thoughts:  No  Memory:  Immediate;   Fair Recent;   Fair Remote;   Fair  Judgement:  Fair  Insight:  Fair  Psychomotor Activity:  Normal  Concentration:  Fair  Recall:  [employer] of Knowledge:Fair  Language: Fair  Akathisia:  No  Handed:  Right  AIMS (if indicated):     Assets:  Communication Skills Desire for Improvement Financial Resources/Insurance  Sleep:  Number of Hours: 6  Cognition: WNL  ADL's:  Intact   Have you used any form of tobacco in the last 30 days? (Cigarettes, Smokeless Tobacco, Cigars, and/or Pipes): Yes  Has this patient used any form of tobacco in the last 30 days? (Cigarettes, Smokeless Tobacco, Cigars, and/or Pipes) Yes, A prescription for an FDA-approved tobacco cessation medication was offered at discharge and the patient refused  Mental Status Per Nursing Assessment::   On Admission:     Current Mental Status by Physician: NA  Loss Factors: Decline in physical health  Historical Factors: Impulsivity  Risk Reduction Factors:   Sense of responsibility to  family and Positive social support  Continued Clinical Symptoms:  Depression:   Comorbid alcohol abuse/dependence Severe Alcohol/Substance Abuse/Dependencies  Cognitive Features That Contribute To Risk:  None    Suicide Risk:  Minimal: No identifiable suicidal ideation.  Patients presenting with no risk factors but with morbid ruminations; may be classified as minimal risk based on the severity of the depressive symptoms  Principal Problem: Major depressive disorder, recurrent, severe without psychotic features Discharge Diagnoses:  Patient Active Problem List   Diagnosis Date Noted  . Tobacco use disorder [Z72.0] 04/28/2015  . Cocaine use disorder, moderate, dependence [F14.20] 04/28/2015  . Uremia [N19] 03/12/2015  . Major depressive disorder, recurrent, severe without psychotic features [F33.2] 03/12/2015  . COPD (chronic obstructive pulmonary disease) [J44.9] 03/08/2015  . CKD (chronic kidney disease) requiring chronic dialysis [N18.6, Z99.2] 03/08/2015  . ESRD needing dialysis [N18.6] 02/26/2015  . Vaginal discharge [N89.8]   . Essential hypertension [I10]   . Chronic combined systolic and diastolic congestive heart failure [I50.42]   . Macrocytic anemia [D53.9]   . Chest pain [R07.9]   . HIV disease [B20]   . ESRD on dialysis [N18.6, Z99.2]   . Hyperkalemia [E87.5] 07/12/2014  . Screening examination for venereal disease [Z11.3] 05/13/2014  . Encounter for long-term (current) use of other medications [Z79.899] 05/13/2014  . Respiratory failure [J96.90] 05/08/2014  . Dyspnea [R06.00] 04/21/2014  . Pulmonary edema [J81.1] 04/21/2014  . Crack cocaine use [F14.90] 01/17/2014  . Osteoarthritis of right hip [M16.11] 12/17/2013  .  CCF (congestive cardiac failure) [I50.9] 10/30/2013  . Extreme obesity [E66.01] 10/23/2013  . ESRD (end stage renal disease) on dialysis [N18.6, Z99.2] 09/30/2013  . Anemia secondary to renal failure [D63.1] 02/04/2013  . Compulsive tobacco user  syndrome [F17.200] 01/30/2013  . Hyperparathyroidism due to renal insufficiency [N25.81] 01/30/2013  . Anxiety state [F41.1] 01/30/2013  . HIV infection [Z21]   . Hypertension [I10]   . Mixed, or nondependent drug abuse, in remission [F19.10] 08/02/2010      Plan Of Care/Follow-up recommendations:  Activity:  as tolerated. Diet:  low sodium heart healthy renal diet. Other:  keep follow up appointments.  Is patient on multiple antipsychotic therapies at discharge:  No   Has Patient had three or more failed trials of antipsychotic monotherapy by history:  No  Recommended Plan for Multiple Antipsychotic Therapies: NA    Deliyah Muckle 05/06/2015, 11:44 AM

## 2015-05-06 NOTE — Progress Notes (Signed)
D: Pt denies SI/HI/AVH. Pt is pleasant and cooperative. Patients affect is flat but she brightens upon approach, she is  interacting with peers and staff appropriately.  A: Pt was offered support and encouragement. Pt was given scheduled medications. Pt was encouraged to attend groups. Q 15 minute checks were done for safety.  R: Pt attended  group and interacted well with peers and staff. Pt is taking medication. Pt has no complaints. Patient is scheduled to be discharged tomorrow. Pt receptive to treatment and safety maintained on unit.

## 2015-05-06 NOTE — Progress Notes (Signed)
[facility]  ROUNDING NOTE   Subjective:  Seen and examined on hemodialysis.  Blood pressure elevated on treatment.   Objective:  Vital signs in last 24 hours:  Temp:  [97.7 F (36.5 C)-99 F (37.2 C)] 97.7 F (36.5 C) (07/28 1030) Pulse Rate:  [91-103] 91 (07/28 1030) Resp:  [19-20] 19 (07/28 1030) BP: (162-185)/(81-98) 185/97 mmHg (07/28 1030)  Weight change:  Filed Weights   04/28/15 1615 04/28/15 1700  Weight: 114.306 kg (252 lb) 114.306 kg (252 lb)    Intake/Output: I/O last 3 completed shifts: In: 480 [P.O.:480] Out: -    Intake/Output this shift:  Total I/O In: 240 [P.O.:240] Out: -   Physical Exam: General: NAD, sitting up in chair  Head: Normocephalic, atraumatic. Moist oral mucosal membranes  Eyes: Anicteric  Neck: Supple, trachea midline  Lungs:  Clear to auscultation normal effort  Heart: Regular rate and rhythm no rubs  Abdomen:  Soft, nontender, BS present  Extremities:  trace peripheral edema.  Neurologic: Nonfocal, moving all four extremities  Skin: No lesions  Access: LUE AVF    Basic Metabolic Panel:  Recent Labs Lab 04/29/15 1254 05/01/15 1048 05/04/15 1124 05/06/15 1029  NA 136 135 131* 132*  K 4.3 4.6 5.4* 5.0  CL 94* 95* 90* 92*  CO2 25 27 27 28   GLUCOSE 135* 113* 97 107*  BUN 42* 33* 38* 26*  CREATININE 10.53* 8.62* 10.38* 8.42*  CALCIUM 8.2* 8.1* 8.6* 8.7*  PHOS 4.2 3.0  3.0 3.6 3.3    Liver Function Tests:  Recent Labs Lab 04/29/15 1254 05/01/15 1048 05/04/15 1124 05/06/15 1029  ALBUMIN 3.9 3.5 3.8 3.7   No results for input(s): LIPASE, AMYLASE in the last 168 hours. No results for input(s): AMMONIA in the last 168 hours.  CBC:  Recent Labs Lab 04/29/15 1254 05/01/15 1048 05/04/15 0635 05/04/15 1124 05/06/15 1029  WBC 7.3 7.2 7.3 7.3 6.3  NEUTROABS  --   --  4.4  --   --   HGB 10.2* 9.7*  --  9.6* 9.0*  HCT 30.3* 29.3* 27.7* 29.0* 27.2*  MCV 104.1* 104.5*  --  104.6* 104.4*  PLT 309  268  --  240 207    Cardiac Enzymes: No results for input(s): CKTOTAL, CKMB, CKMBINDEX, TROPONINI in the last 168 hours.  BNP: Invalid input(s): POCBNP  CBG: No results for input(s): GLUCAP in the last 168 hours.  Microbiology: Results for orders placed or performed during the hospital encounter of 03/08/15  MRSA PCR Screening     Status: None   Collection Time: 03/09/15  2:16 AM  Result Value Ref Range Status   MRSA by PCR NEGATIVE NEGATIVE Final    Comment:        The GeneXpert MRSA Assay (FDA approved for NASAL specimens only), is one component of a comprehensive MRSA colonization surveillance program. It is not intended to diagnose MRSA infection nor to guide or monitor treatment for MRSA infections.     Coagulation Studies: No results for input(s): LABPROT, INR in the last 72 hours.  Urinalysis: No results for input(s): COLORURINE, LABSPEC, PHURINE, GLUCOSEU, HGBUR, BILIRUBINUR, KETONESUR, PROTEINUR, UROBILINOGEN, NITRITE, LEUKOCYTESUR in the last 72 hours.  Invalid input(s): APPERANCEUR    Imaging: No results found.   Medications:     . amitriptyline  10 mg Oral QHS  . amLODipine  10 mg Oral Daily  . b complex-vitamin c-folic acid  1 tablet Oral Daily  . diphenhydrAMINE  50 mg Oral QHS  .  dolutegravir  50 mg Oral Daily  . feeding supplement (NEPRO CARB STEADY)  237 mL Oral q morning - 10a  . FLUoxetine  20 mg Oral Daily  . hydrALAZINE  25 mg Oral 3 times per day  . lamiVUDine  25 mg Oral Daily  . metoprolol tartrate  25 mg Oral BID  . pantoprazole  40 mg Oral Daily  . pneumococcal 23 valent vaccine  0.5 mL Intramuscular Tomorrow-1000  . sevelamer carbonate  2,400 mg Oral TID WC  . tenofovir  300 mg Oral Weekly  . tiotropium  18 mcg Inhalation Daily   sodium chloride, sodium chloride, acetaminophen, albuterol, alum & mag hydroxide-simeth, heparin, lidocaine (PF), lidocaine-prilocaine, magnesium hydroxide, nicotine,  pentafluoroprop-tetrafluoroeth  Assessment/ Plan:  64 y.o. female with Pmhx of ESRD on HD (not at an established dialysis center, had an issue at [facility], has been going to ED for dialysis at [facility] and [facility]), HIV, substance abuse, anemia of CKD, SHPTH, dperession, COPD, hypertension, LUE AVF.  1.  ESRD on HD:  Pt doesn't have an established dialysis center, released two years ago from [facility] from an [facility] clinic.  Has been going to ED to obtain dialysis at [facility]. - tolerating dialysis well. Will challenge ultrafiltration   2.  Anemia of CKD: hgb 9 - continue epogen 10000 units IV with HD.   3.  SHPTH:  Phos 3.3 and acceptable, continue renvela.   4.  Hypertension:  With trace edema - amlodipine, hydralazine. May need further control.  - Start metoprolol  bid.   5. Disp: group home in [place] is willing to accept her. Will need to get outpatient dialysis set up for her.    LOS: 8 Damarri Rampy 7/28/201611:07 AM

## 2015-05-06 NOTE — Plan of Care (Signed)
Problem: Alteration in mood Goal: LTG-Pt's behavior demonstrates decreased signs of depression (Patient's behavior demonstrates decreased signs of depression to the point the patient is safe to return home and continue treatment in an outpatient setting)  Outcome: Progressing Affect is brighter, interacting appropriately with peers and staff.

## 2015-05-06 NOTE — [facility] Suicide Risk Assessment (Signed)
[facility] INPATIENT:  Family/Significant Other Suicide Prevention Education  Suicide Prevention Education:  Patient Refusal for Family/Significant Other Suicide Prevention Education: The patient [name] has refused to provide written consent for family/significant other to be provided Family/Significant Other Suicide Prevention Education during admission and/or prior to discharge.  Physician notified.  Lulu Riding, MSW, LCSWA 05/06/2015, 4:16 PM

## 2015-05-06 NOTE — [facility] Group Notes (Signed)
[facility] Group Notes:  (Nursing/MHT/Case Management/Adjunct)  Date:  05/06/2015  Time:  2:14 PM  Type of Therapy:  Psychoeducational Skills  Participation Level:  Did Not Attend    Kelisha Dall A Gabriella Woodhead 05/06/2015, 2:14 PM 

## 2015-05-06 NOTE — Progress Notes (Signed)
[facility] LCSW Group Therapy  05/06/2015 1:30 PM  Type of Therapy:  Group Therapy  Participation Level:  Did Not Attend  Participation Quality:    Affect:    Cognitive:    Insight:    Engagement in Therapy:    Modes of Intervention:    Summary of Progress/Problems:  Cheron Schaumann 05/06/2015, 1:30 PM

## 2015-05-06 NOTE — [facility] Group Notes (Signed)
[facility] Group Notes:  (Nursing/MHT/Case Management/Adjunct)  Date:  05/06/2015  Time:  1:46 AM  Type of Therapy:  Psychoeducational Skills  Participation Level:  Active  Participation Quality:  Appropriate, Attentive and Sharing  Affect:  Appropriate  Cognitive:  Appropriate and Oriented  Insight:  Good  Engagement in Group:  Engaged  Modes of Intervention:  Discussion, Exploration and Support  Summary of Progress/Problems:  [name] 05/06/2015, 1:46 AM

## 2015-05-06 NOTE — Progress Notes (Signed)
Recreation Therapy Notes  INPATIENT RECREATION TR PLAN  Patient Details Name: [name] MRN: [id number] DOB: 1951/09/02 Today's Date: 05/06/2015  Rec Therapy Plan Is patient appropriate for Therapeutic Recreation?: Yes Treatment times per week: At least 2 times a week TR Treatment/Interventions: 1:1 session, Group participation (Comment) (Appropriate participation in daily recreation therapy tx)  Discharge Criteria Pt will be discharged from therapy if:: Discharged Treatment plan/goals/alternatives discussed and agreed upon by:: Patient/family  Discharge Summary Short term goals set: See Care Plan Short term goals met: Complete Progress toward goals comments: One-to-one attended Which groups?: Coping skills, Self-esteem One-to-one attended: Anger management Reason goals not met: N/A Therapeutic equipment acquired: None Reason patient discharged from therapy: Discharge from hospital Pt/family agrees with progress & goals achieved: Yes Date patient discharged from therapy: 05/06/15   Leonette Monarch, LRT/CTRS 05/06/2015, 4:36 PM

## 2015-05-06 NOTE — Progress Notes (Signed)
  [facility] Adult Case Management Discharge Plan :  Will you be returning to the same living situation after discharge:  No. Patient will be residing at [facility] at [address] [place], [place] [phone] At discharge, do you have transportation home?: No. Cab voucher is provided  Do you have the ability to pay for your medications: Yes,  patient has Medicaid and Medicare  Release of information consent forms completed and in the chart;  Patient's signature needed at discharge.  Patient to Follow up at: Follow-up Information    Schedule an appointment as soon as possible for a visit with [facility] Dr. Mitzi Hansen.   Why:  For follow-up care; patient may schedule appt for followup care   Contact information:   [address] [place], [place] Ph [phone] Fax [phone]       Follow up with [facility]. Go in 4 days.   Why:  For follow-up care; Hospital followup appointment Monday 05/10/15 at 8:00am for walk in appt.; Walkin appts are MWF 8am-3pm at [facility]; patient will followup at [facility] till she is able to get an appointment with Dr. Mitzi Hansen at [facility] information:   [address] [place], [place] Ph [phone] Fax [phone]      Patient denies SI/HI: Yes,  patient denies SI/HI    Safety Planning and Suicide Prevention discussed: Yes,  SPE discussed with patient and patient refused family contact  Have you used any form of tobacco in the last 30 days? (Cigarettes, Smokeless Tobacco, Cigars, and/or Pipes): Yes  Has patient been referred to the Quitline?: Patient refused referral  Lulu Riding, MSW, LCSWA 05/06/2015, 4:16 PM

## 2015-05-06 NOTE — Plan of Care (Signed)
Problem: Ineffective individual coping Goal: STG:Pt. will utilize relaxation techniques to reduce stress STG: Patient will utilize relaxation techniques to reduce stress levels  Outcome: Progressing Patient progressing in the use of  effective coping skills, verbalizing needs and plans for D/C.

## 2015-05-06 NOTE — Progress Notes (Signed)
Recreation Therapy Notes  Date: 07.28.16 Time: 3:00 pm Location: Craft Room  Group Topic: Coping Skills, Leisure Education  Goal Area(s) Addresses:  Patient will identify things they are grateful for. Patient will identify how being grateful can influence decision making.  Behavioral Response: Did not attend  Intervention: Grateful Wheel  Activity: Patients were given an "I Am Grateful For" worksheet and instructed to list at least one thing they are grateful for under each category.   Education: LRT educated patients on why it is important to be grateful.  Education Outcome: Patient did not attend group.  Clinical Observations/Feedback: Patient did not attend group.  Jacquelynn Cree, LRT/CTRS 05/06/2015 4:06 PM

## 2015-05-06 NOTE — Progress Notes (Addendum)
Nurs Dischg Note:  D:Patient denies SI/HI at this time. Pt appears calm and cooperative, and no distress noted.  A: All Personal items in locker returned to pt. Pt escorted out of the building.to taxi  Review of discharge instructions  With follow up and  Prescriptions. Patient aware of going to a group home    R:  Pt States she will comply with outpatient services, and take MEDS as prescribed.

## 2015-05-07 ENCOUNTER — Other Ambulatory Visit: Payer: Self-pay

## 2015-05-07 ENCOUNTER — Emergency Department
Admission: EM | Admit: 2015-05-07 | Discharge: 2015-05-08 | Discharge status: Against Medical Advice | Payer: Medicare Other | Attending: Emergency Medicine | Admitting: Emergency Medicine

## 2015-05-07 ENCOUNTER — Encounter: Payer: Self-pay | Admitting: Emergency Medicine

## 2015-05-07 DIAGNOSIS — I10 Essential (primary) hypertension: Secondary | ICD-10-CM | POA: Diagnosis not present

## 2015-05-07 DIAGNOSIS — Z720 Tobacco use: Secondary | ICD-10-CM | POA: Insufficient documentation

## 2015-05-07 NOTE — Progress Notes (Signed)
AVS H&P Discharge Summary faxed to [facility] for hospital follow-up

## 2015-05-07 NOTE — ED Notes (Signed)
Patient states that she was discharged from the [facility] yesterday. Patient reports that she was not given all of her blood pressure medication while she was there. Patient states that she does not have anyone to pick up her medication so she has not been taking her blood pressure medication since discharge. Patient states that her blood pressure has been elevated times 2 weeks.

## 2015-05-08 ENCOUNTER — Emergency Department
Admission: EM | Admit: 2015-05-08 | Discharge: 2015-05-08 | Disposition: A | Discharge status: Home / Self Care | Payer: Medicare Other

## 2015-05-08 NOTE — ED Notes (Signed)
Pt came here wanting dialysis, this Rn called Dr Wynelle Link, he informed me that pt had an appointment set up at [facility] in [place] on Tue 05/11/15. Information written down and given to pt.

## 2015-05-09 ENCOUNTER — Other Ambulatory Visit: Payer: Self-pay

## 2015-05-09 ENCOUNTER — Encounter: Payer: Self-pay | Admitting: Emergency Medicine

## 2015-05-09 ENCOUNTER — Emergency Department
Admission: EM | Admit: 2015-05-09 | Discharge: 2015-05-09 | Disposition: A | Discharge status: Home / Self Care | Payer: Medicare Other | Attending: Emergency Medicine | Admitting: Emergency Medicine

## 2015-05-09 ENCOUNTER — Emergency Department: Payer: Medicare Other

## 2015-05-09 ENCOUNTER — Emergency Department
Admission: EM | Admit: 2015-05-09 | Discharge: 2015-05-10 | Disposition: A | Discharge status: Home / Self Care | Payer: Medicare Other | Source: Home / Self Care | Attending: Emergency Medicine | Admitting: Emergency Medicine

## 2015-05-09 DIAGNOSIS — N186 End stage renal disease: Secondary | ICD-10-CM | POA: Insufficient documentation

## 2015-05-09 DIAGNOSIS — I120 Hypertensive chronic kidney disease with stage 5 chronic kidney disease or end stage renal disease: Secondary | ICD-10-CM | POA: Insufficient documentation

## 2015-05-09 DIAGNOSIS — R0602 Shortness of breath: Secondary | ICD-10-CM

## 2015-05-09 DIAGNOSIS — J441 Chronic obstructive pulmonary disease with (acute) exacerbation: Secondary | ICD-10-CM

## 2015-05-09 DIAGNOSIS — Z992 Dependence on renal dialysis: Secondary | ICD-10-CM

## 2015-05-09 DIAGNOSIS — Z720 Tobacco use: Secondary | ICD-10-CM | POA: Insufficient documentation

## 2015-05-09 DIAGNOSIS — Z79899 Other long term (current) drug therapy: Secondary | ICD-10-CM | POA: Insufficient documentation

## 2015-05-09 DIAGNOSIS — B20 Human immunodeficiency virus [HIV] disease: Secondary | ICD-10-CM | POA: Diagnosis not present

## 2015-05-09 LAB — CBC WITH DIFFERENTIAL/PLATELET
BASOS ABS: 0.1 10*3/uL (ref 0–0.1)
Basophils Relative: 1 %
Eosinophils Absolute: 0.1 10*3/uL (ref 0–0.7)
Eosinophils Relative: 2 %
HCT: 25.8 % — ABNORMAL LOW (ref 35.0–47.0)
Hemoglobin: 8.4 g/dL — ABNORMAL LOW (ref 12.0–16.0)
LYMPHS ABS: 2.2 10*3/uL (ref 1.0–3.6)
LYMPHS PCT: 29 %
MCH: 34.5 pg — ABNORMAL HIGH (ref 26.0–34.0)
MCHC: 32.7 g/dL (ref 32.0–36.0)
MCV: 105.4 fL — ABNORMAL HIGH (ref 80.0–100.0)
Monocytes Absolute: 0.9 10*3/uL (ref 0.2–0.9)
Monocytes Relative: 12 %
NEUTROS ABS: 4.4 10*3/uL (ref 1.4–6.5)
NEUTROS PCT: 58 %
Platelets: 257 10*3/uL (ref 150–440)
RBC: 2.44 MIL/uL — AB (ref 3.80–5.20)
RDW: 14.1 % (ref 11.5–14.5)
WBC: 7.6 10*3/uL (ref 3.6–11.0)

## 2015-05-09 LAB — BASIC METABOLIC PANEL
Anion gap: 13 (ref 5–15)
BUN: 43 mg/dL — ABNORMAL HIGH (ref 6–20)
CO2: 29 mmol/L (ref 22–32)
Calcium: 7.7 mg/dL — ABNORMAL LOW (ref 8.9–10.3)
Chloride: 96 mmol/L — ABNORMAL LOW (ref 101–111)
Creatinine, Ser: 10.48 mg/dL — ABNORMAL HIGH (ref 0.44–1.00)
GFR calc Af Amer: 4 mL/min — ABNORMAL LOW (ref >60)
GFR, EST NON AFRICAN AMERICAN: 3 mL/min — AB (ref >60)
GLUCOSE: 101 mg/dL — AB (ref 65–99)
POTASSIUM: 4.7 mmol/L (ref 3.5–5.1)
Sodium: 138 mmol/L (ref 135–145)

## 2015-05-09 LAB — TROPONIN I: Troponin I: <0.03 ng/mL (ref <0.031)

## 2015-05-09 NOTE — ED Notes (Signed)
Pt from group home suffering from sob. Pt is dialysis pt and has not received her dialysis treatments in several days (either Tuesday or Thursday, she is not sure). Pt has been homeless and has not had a dialysis center in 3 years for reasons unknown. Upon assessment, pt states she was so tired and frustrated about not having dialysis center recently that she expressed SI and was hospitalized for it. She denies SI at this time. Pt also admits to previous drug and alcohol abuse but states that she has not used drugs or alcohol since last hospitalization. Pt alert & oriented with warm, dry skin. Pt c/o ache to left side x 2 days; headache x 2 weeks.

## 2015-05-09 NOTE — ED Provider Notes (Signed)
[facility] Emergency Department Provider Note    ____________________________________________  Time seen: 2305  I have reviewed the triage vital signs and the nursing notes.   HISTORY  Chief Complaint Shortness of Breath   History limited by: Not Limited   HPI [name] is a 64 y.o. female presents to the emergency department with continued shortness breath. She states that the shortness breath started roughly 25 hours ago. She was seen in the emergency department earlier today. At that time blood work without any overly concerning findings and chest x-ray also without any concerning findings. Patient is dialysis patient has not had dialysis for a number of days. She states she feels she needs dialysis. She denies any significant chest pain or fevers. States she is coughing up some phlegm. She also complains of some swelling in her hands. Has dialysis appointment in 2 days.   Past Medical History  Diagnosis Date  . Hypertension   . HIV infection   . Morbid obesity   . ESRD (end stage renal disease) on dialysis 09/30/2013    Started dialysis in [place], [place] around 2009.  ESRD due to HTN vs drug abuse according to pt.  Was on dialysis at [facility] until Feb 2015 when she was admitted to a SNF due to homelessness and drug abuse.  Then changed to [facility] on TTS schedule.     . Anginal pain   . CHF (congestive heart failure)   . Shortness of breath   . Pneumonia   . Depression   . Anxiety   . GERD (gastroesophageal reflux disease)   . Arthritis   . Anemia   . Hyperkalemia 08/11/2014  . Homeless   . CHF (congestive heart failure)     Patient Active Problem List   Diagnosis Date Noted  . Tobacco use disorder 04/28/2015  . Cocaine use disorder, moderate, dependence 04/28/2015  . Uremia 03/12/2015  . Major depressive disorder, recurrent, severe without psychotic features 03/12/2015  . COPD (chronic obstructive pulmonary disease)  03/08/2015  . CKD (chronic kidney disease) requiring chronic dialysis 03/08/2015  . ESRD needing dialysis 02/26/2015  . Vaginal discharge   . Essential hypertension   . Chronic combined systolic and diastolic congestive heart failure   . Macrocytic anemia   . Chest pain   . HIV disease   . ESRD on dialysis   . Hyperkalemia 07/12/2014  . Screening examination for venereal disease 05/13/2014  . Encounter for long-term (current) use of other medications 05/13/2014  . Respiratory failure 05/08/2014  . Dyspnea 04/21/2014  . Pulmonary edema 04/21/2014  . Crack cocaine use 01/17/2014  . Osteoarthritis of right hip 12/17/2013  . CCF (congestive cardiac failure) 10/30/2013  . Extreme obesity 10/23/2013  . ESRD (end stage renal disease) on dialysis 09/30/2013  . Anemia secondary to renal failure 02/04/2013  . Compulsive tobacco user syndrome 01/30/2013  . Hyperparathyroidism due to renal insufficiency 01/30/2013  . Anxiety state 01/30/2013  . HIV infection   . Hypertension   . Mixed, or nondependent drug abuse, in remission 08/02/2010    Past Surgical History  Procedure Laterality Date  . Arteriovenous graft placement      left forearm  . Laparotomy      states seh was cut open because seh was bleeding on the inside  . Cesarean section    . Cardiac catheterization      Current Outpatient Rx  Name  Route  Sig  Dispense  Refill  .  albuterol (PROVENTIL HFA;VENTOLIN HFA) 108 (90 BASE) MCG/ACT inhaler   Inhalation   Inhale 2 puffs into the lungs every 6 (six) hours as needed for wheezing or shortness of breath.   1 Inhaler   0   . amLODipine (NORVASC) 5 MG tablet   Oral   Take 2 tablets (10 mg total) by mouth at bedtime.   60 tablet   0   . b complex-vitamin c-folic acid (NEPHRO-VITE) 0.8 MG TABS tablet   Oral   Take 1 tablet by mouth daily.   30 tablet   0   . calcitRIOL (ROCALTROL) 0.5 MCG capsule   Oral   Take 4 capsules (2 mcg total) by mouth daily. Takes on Mon,  Wed, and Fri with Dialysis   120 capsule   0   . cinacalcet (SENSIPAR) 60 MG tablet   Oral   Take 1 tablet (60 mg total) by mouth 2 (two) times daily.   60 tablet   0   . diphenhydrAMINE (BENADRYL) 50 MG capsule   Oral   Take 1 capsule (50 mg total) by mouth at bedtime.   30 capsule   0   . dolutegravir (TIVICAY) 50 MG tablet   Oral   Take 1 tablet (50 mg total) by mouth daily.   30 tablet   5   . FLUoxetine (PROZAC) 20 MG capsule   Oral   Take 1 capsule (20 mg total) by mouth daily.   30 capsule   0   . hydrALAZINE (APRESOLINE) 25 MG tablet   Oral   Take 1 tablet (25 mg total) by mouth every 8 (eight) hours.   90 tablet   0   . lamiVUDine (EPIVIR) 10 MG/ML solution   Oral   Take 2.5 mLs (25 mg total) by mouth daily.   240 mL   12   . metoprolol tartrate (LOPRESSOR) 25 MG tablet   Oral   Take 1 tablet (25 mg total) by mouth 2 (two) times daily.   60 tablet   0   . pantoprazole (PROTONIX) 40 MG tablet   Oral   Take 1 tablet (40 mg total) by mouth daily.   30 tablet   0   . raltegravir (ISENTRESS) 400 MG tablet   Oral   Take 1 tablet (400 mg total) by mouth 2 (two) times daily.   60 tablet   5   . sevelamer carbonate (RENVELA) 800 MG tablet   Oral   Take 3 tablets (2,400 mg total) by mouth 3 (three) times daily with meals.   270 tablet   0   . tenofovir (VIREAD) 300 MG tablet   Oral   Take 1 tablet (300 mg total) by mouth once a week. Give on Friday   5 tablet   5   . tiotropium (SPIRIVA) 18 MCG inhalation capsule   Inhalation   Place 1 capsule (18 mcg total) into inhaler and inhale daily.   30 capsule   12     Allergies Trazodone and nefazodone and Morphine and related  Family History  Problem Relation Age of Onset  . Kidney failure Other     niece  . High blood pressure    . Lung cancer    . Breast cancer Neg Hx   . Colon cancer Neg Hx   . Stroke Mother   . HIV/AIDS Brother     died of AIDS    Social History History   Substance Use Topics  .  Smoking status: Current Every Day Smoker -- 0.50 packs/day    Types: Cigarettes    Start date: 05/06/2014  . Smokeless tobacco: Never Used     Comment: recently quit  . Alcohol Use: No     Comment: pt states she quit drinking after last hospitalization    Review of Systems  Constitutional: Negative for fever. Cardiovascular: Negative for chest pain. Respiratory: Positive for shortness of breath. Gastrointestinal: Negative for abdominal pain, vomiting and diarrhea. Genitourinary: Negative for dysuria. Musculoskeletal: Negative for back pain. Skin: Negative for rash. Neurological: Negative for headaches, focal weakness or numbness.   10-point ROS otherwise negative.  ____________________________________________   PHYSICAL EXAM:  VITAL SIGNS: ED Triage Vitals  Enc Vitals Group     BP 05/09/15 2250 156/93 mmHg     Pulse Rate 05/09/15 2250 87     Resp 05/09/15 2250 22     Temp 05/09/15 2250 98.2 F (36.8 C)     Temp Source 05/09/15 2250 Oral     SpO2 05/09/15 2250 99 %     Weight 05/09/15 2250 262 lb 5 oz (118.984 kg)     Height 05/09/15 2250 5\' 4"  (1.626 m)     Head Cir --      Peak Flow --      Pain Score 05/09/15 2251 0   Constitutional: Alert and oriented. Well appearing and in no distress. Eyes: Conjunctivae are normal. PERRL. Normal extraocular movements. ENT   Head: Normocephalic and atraumatic.   Nose: No congestion/rhinnorhea.   Mouth/Throat: Mucous membranes are moist.   Neck: No stridor. Hematological/Lymphatic/Immunilogical: No cervical lymphadenopathy. Cardiovascular: Normal rate, regular rhythm.  No murmurs, rubs, or gallops. Respiratory: Normal respiratory effort without tachypnea nor retractions. Breath sounds are clear and equal bilaterally. No wheezes/rales/rhonchi. Gastrointestinal: Soft and nontender. No distention.  Genitourinary: Deferred Musculoskeletal: Normal range of motion in all extremities. No  joint effusions.  No lower extremity tenderness nor edema. Neurologic:  Normal speech and language. No gross focal neurologic deficits are appreciated. Speech is normal.  Skin:  Skin is warm, dry and intact. No rash noted. Psychiatric: Mood and affect are normal. Speech and behavior are normal. Patient exhibits appropriate insight and judgment.  ____________________________________________    LABS (pertinent positives/negatives)  Labs Reviewed  CBC WITH DIFFERENTIAL/PLATELET - Abnormal; Notable for the following:    RBC 2.34 (*)    Hemoglobin 8.2 (*)    HCT 24.7 (*)    MCV 105.6 (*)    MCH 35.2 (*)    All other components within normal limits  BASIC METABOLIC PANEL - Abnormal; Notable for the following:    Potassium 5.2 (*)    Chloride 96 (*)    BUN 51 (*)    Creatinine, Ser 11.94 (*)    Calcium 7.9 (*)    GFR calc non Af Amer 3 (*)    GFR calc Af Amer 3 (*)    All other components within normal limits      ____________________________________________   EKG  I, Phineas Semen, attending physician, personally viewed and interpreted this EKG  EKG Time: 2251 Rate: 87 Rhythm: Normal Sinus Rhythm Axis: normal Intervals: qtc 502 QRS: narrow ST changes: no st elevation ____________________________________________    RADIOLOGY  Chest x-ray IMPRESSION: Mild congestive heart failure with interstitial and alveolar edema. ____________________________________________   PROCEDURES  Procedure(s) performed: None  Critical Care performed: No  ____________________________________________   INITIAL IMPRESSION / ASSESSMENT AND PLAN / ED COURSE  Pertinent labs & imaging results that  were available during my care of the patient were reviewed by me and considered in my medical decision making (see chart for details).  Patient presents to the emergency department today with shortness of breath. She is dialysis patient. Will check chest x-ray give some blood work to  evaluate for pulmonary edema or hyperkalemia. If no indications for emergent dialysis think patient will likely be safe to discharge back to her living facility.  ----------------------------------------- 6:57 AM on 05/10/2015 -----------------------------------------  Patient's potassium mildly elevated, some mild pulmonary edema, however I don't think either of these require emergent dialysis at this time. Patient's discharge has been delayed however because we are trying to set up patient for dialysis today if possible. I am hopeful that if we can get patient in dialysis appointment today without permanent any further concerns on the patient's part as well as potentially avoiding further emergency department visits. I have talked to nephrology to will investigate as to whether or not would be possible to get patient dialysis today.  ____________________________________________   FINAL CLINICAL IMPRESSION(S) / ED DIAGNOSES  Shortness of Breath  Phineas Semen, MD 05/10/15 [phone]

## 2015-05-09 NOTE — ED Notes (Signed)
Pt denies orthopnea. Pt states 'I just feel like i am full of fluid, i need to get dialysis."

## 2015-05-09 NOTE — ED Notes (Signed)
Pt is not expressing SI at this time. She has been treated for SI in last 30 days; states that now that she is in a dialysis center, she is hopeful.

## 2015-05-09 NOTE — Discharge Instructions (Signed)
Your evaluation for shortness of breath and although no certain cause was found today, your exam and evaluation are reassuring.  You have  dialysis set up for Tuesday, and your blood work did not indicate the need for emergent dialysis today.  Return to the emergency department for any worsening condition including trouble breathing, shortness of breath, fever, chest pain, or altered mental status.   Shortness of Breath Shortness of breath means you have trouble breathing. Shortness of breath needs medical care right away. HOME CARE   Do not smoke.  Avoid being around chemicals or things (paint fumes, dust) that may bother your breathing.  Rest as needed. Slowly begin your normal activities.  Only take medicines as told by your doctor.  Keep all doctor visits as told. GET HELP RIGHT AWAY IF:   Your shortness of breath gets worse.  You feel lightheaded, pass out (faint), or have a cough that is not helped by medicine.  You cough up blood.  You have pain with breathing.  You have pain in your chest, arms, shoulders, or belly (abdomen).  You have a fever.  You cannot walk up stairs or exercise the way you normally do.  You do not get better in the time expected.  You have a hard time doing normal activities even with rest.  You have problems with your medicines.  You have any new symptoms. MAKE SURE YOU:  Understand these instructions.  Will watch your condition.  Will get help right away if you are not doing well or get worse. Document Released: 03/13/2008 Document Revised: 09/30/2013 Document Reviewed: 12/11/2011 [facility] Patient Information 2015 [place], [place]. This information is not intended to replace advice given to you by your health care provider. Make sure you discuss any questions you have with your health care provider.

## 2015-05-09 NOTE — ED Provider Notes (Signed)
[facility] Emergency Department Provider Note   ____________________________________________  Time seen: 8 AM I have reviewed the triage vital signs and the triage nursing note.  HISTORY  Chief Complaint Shortness of Breath   Historian Patient, but limited due to poor historian  HPI [name] is a 64 y.o. female who has a history of end-stage renal who is on dialysis, has a history of mental illness, and a history of HIV, who is here complaining of 1-2 days of increasing shortness of breath. Very difficult obtaining history from this woman, however it seems like she is asking for dialysis. She is unsure when she had her last dialysis. She gives a history that she was recently admitted at [facility] for psychiatric reasons. She also gives a history that she does not have an outpatient dialysis and has been obtaining Hospital dialysis, and I suspect this is also at [facility]. No chest pain. No fever. No skin rash. No abdominal pain. No vomiting. She is complaining of increasing shortness of breath, without a cough. She reports also a history of COPD.   Past Medical History  Diagnosis Date  . Hypertension   . HIV infection   . Morbid obesity   . ESRD (end stage renal disease) on dialysis 09/30/2013    Started dialysis in [place], [place] around 2009.  ESRD due to HTN vs drug abuse according to pt.  Was on dialysis at [facility] until Feb 2015 when she was admitted to a SNF due to homelessness and drug abuse.  Then changed to [facility] on TTS schedule.     . Anginal pain   . CHF (congestive heart failure)   . Shortness of breath   . Pneumonia   . Depression   . Anxiety   . GERD (gastroesophageal reflux disease)   . Arthritis   . Anemia   . Hyperkalemia 08/11/2014  . Homeless   . CHF (congestive heart failure)     Patient Active Problem List   Diagnosis Date Noted  . Tobacco use disorder 04/28/2015  . Cocaine use disorder, moderate, dependence  04/28/2015  . Uremia 03/12/2015  . Major depressive disorder, recurrent, severe without psychotic features 03/12/2015  . COPD (chronic obstructive pulmonary disease) 03/08/2015  . CKD (chronic kidney disease) requiring chronic dialysis 03/08/2015  . ESRD needing dialysis 02/26/2015  . Vaginal discharge   . Essential hypertension   . Chronic combined systolic and diastolic congestive heart failure   . Macrocytic anemia   . Chest pain   . HIV disease   . ESRD on dialysis   . Hyperkalemia 07/12/2014  . Screening examination for venereal disease 05/13/2014  . Encounter for long-term (current) use of other medications 05/13/2014  . Respiratory failure 05/08/2014  . Dyspnea 04/21/2014  . Pulmonary edema 04/21/2014  . Crack cocaine use 01/17/2014  . Osteoarthritis of right hip 12/17/2013  . CCF (congestive cardiac failure) 10/30/2013  . Extreme obesity 10/23/2013  . ESRD (end stage renal disease) on dialysis 09/30/2013  . Anemia secondary to renal failure 02/04/2013  . Compulsive tobacco user syndrome 01/30/2013  . Hyperparathyroidism due to renal insufficiency 01/30/2013  . Anxiety state 01/30/2013  . HIV infection   . Hypertension   . Mixed, or nondependent drug abuse, in remission 08/02/2010    Past Surgical History  Procedure Laterality Date  . Arteriovenous graft placement      left forearm  . Cardiac catheterization    . Laparotomy  states seh was cut open because seh was bleeding on the inside  . Cesarean section      Current Outpatient Rx  Name  Route  Sig  Dispense  Refill  . albuterol (PROVENTIL HFA;VENTOLIN HFA) 108 (90 BASE) MCG/ACT inhaler   Inhalation   Inhale 2 puffs into the lungs every 6 (six) hours as needed for wheezing or shortness of breath.   1 Inhaler   0   . amLODipine (NORVASC) 5 MG tablet   Oral   Take 2 tablets (10 mg total) by mouth at bedtime.   60 tablet   0   . b complex-vitamin c-folic acid (NEPHRO-VITE) 0.8 MG TABS tablet    Oral   Take 1 tablet by mouth daily.   30 tablet   0   . calcitRIOL (ROCALTROL) 0.5 MCG capsule   Oral   Take 4 capsules (2 mcg total) by mouth daily. Takes on Mon, Wed, and Fri with Dialysis   120 capsule   0   . cinacalcet (SENSIPAR) 60 MG tablet   Oral   Take 1 tablet (60 mg total) by mouth 2 (two) times daily.   60 tablet   0   . diphenhydrAMINE (BENADRYL) 50 MG capsule   Oral   Take 1 capsule (50 mg total) by mouth at bedtime.   30 capsule   0   . dolutegravir (TIVICAY) 50 MG tablet   Oral   Take 1 tablet (50 mg total) by mouth daily.   30 tablet   5   . FLUoxetine (PROZAC) 20 MG capsule   Oral   Take 1 capsule (20 mg total) by mouth daily.   30 capsule   0   . hydrALAZINE (APRESOLINE) 25 MG tablet   Oral   Take 1 tablet (25 mg total) by mouth every 8 (eight) hours.   90 tablet   0   . lamiVUDine (EPIVIR) 10 MG/ML solution   Oral   Take 2.5 mLs (25 mg total) by mouth daily.   240 mL   12   . metoprolol tartrate (LOPRESSOR) 25 MG tablet   Oral   Take 1 tablet (25 mg total) by mouth 2 (two) times daily.   60 tablet   0   . pantoprazole (PROTONIX) 40 MG tablet   Oral   Take 1 tablet (40 mg total) by mouth daily.   30 tablet   0   . raltegravir (ISENTRESS) 400 MG tablet   Oral   Take 1 tablet (400 mg total) by mouth 2 (two) times daily.   60 tablet   5   . sevelamer carbonate (RENVELA) 800 MG tablet   Oral   Take 3 tablets (2,400 mg total) by mouth 3 (three) times daily with meals.   270 tablet   0   . tenofovir (VIREAD) 300 MG tablet   Oral   Take 1 tablet (300 mg total) by mouth once a week. Give on Friday   5 tablet   5   . tiotropium (SPIRIVA) 18 MCG inhalation capsule   Inhalation   Place 1 capsule (18 mcg total) into inhaler and inhale daily.   30 capsule   12     Allergies Trazodone and nefazodone and Morphine and related  Family History  Problem Relation Age of Onset  . Kidney failure Other     niece  . High blood  pressure    . Lung cancer    . Breast cancer Neg Hx   .  Colon cancer Neg Hx   . Stroke Mother   . HIV/AIDS Brother     died of AIDS    Social History History  Substance Use Topics  . Smoking status: Current Every Day Smoker -- 0.50 packs/day    Types: Cigarettes    Start date: 05/06/2014  . Smokeless tobacco: Never Used     Comment: recently quit  . Alcohol Use: No     Comment: pt states she quit drinking after last hospitalization    Review of Systems  Constitutional: Negative for fever. Eyes: Negative for visual changes. ENT: Negative for sore throat. Cardiovascular: Negative for chest pain. Respiratory: Per history of present illness. Gastrointestinal: Negative for abdominal pain, vomiting and diarrhea. Genitourinary: Negative for dysuria. Musculoskeletal: Negative for back pain. Skin: Negative for rash. Neurological: Negative for headaches, focal weakness or numbness. 10 point Review of Systems otherwise negative ____________________________________________   PHYSICAL EXAM:  VITAL SIGNS: ED Triage Vitals  Enc Vitals Group     BP 05/09/15 0750 149/72 mmHg     Pulse Rate 05/09/15 0750 88     Resp 05/09/15 0750 19     Temp 05/09/15 0750 98.4 F (36.9 C)     Temp Source 05/09/15 0750 Oral     SpO2 05/09/15 0750 100 %     Weight 05/09/15 0750 251 lb (113.853 kg)     Height 05/09/15 0750  (1.626 m)     Head Cir --      Peak Flow --      Pain Score 05/09/15 0753 6     Pain Loc --      Pain Edu? --      Excl. in GC? --      Constitutional: Alert, with mild increased respiratory rate when talking.  Eyes: Conjunctivae are normal. PERRL. Normal extraocular movements. ENT   Head: Normocephalic and atraumatic.   Nose: No congestion/rhinnorhea.   Mouth/Throat: Mucous membranes are moist.   Neck: No stridor. Cardiovascular/Chest: Normal rate, regular rhythm.  No murmurs, rubs, or gallops. Respiratory:  Increased respiratory rate with talking,  mild decreased breath sounds throughout, no rhonchi or wheezing. No rales noted. Gastrointestinal: Soft. No distention, no guarding, no rebound. Nontender . Obese  Genitourinary/rectal:Deferred Musculoskeletal: Nontender with normal range of motion in all extremities. No joint effusions.  2+ pitting edema bilateral lower extremities. No calf tenderness. Left forearm fistula. Neurologic:  No slurred speech. No gross or focal neurologic deficits are appreciated. Skin:  Skin is warm, dry and intact. No rash noted. Psychiatric: Denies depression or suicidal ideation.  ____________________________________________   EKG I, Governor Rooks, MD, the attending physician have personally viewed and interpreted all ECGs.  87 bpm. Normal sinus rhythm. Narrow QRS. Normal axis. Nonspecific T wave. No peaked T's. QTc 493 ____________________________________________  LABS (pertinent positives/negatives)  Metabolic panel significant for BUN 43 and creatinine 10.48 with a normal potassium of 4.7 Troponin less than 0.03 CBC shows white blood cell count 7.6 and hemoglobin 8.4  ____________________________________________  RADIOLOGY All Xrays were viewed by me. Imaging interpreted by Radiologist.  Chest x-ray portable:  IMPRESSION: Cardiomegaly with mild interstitial pulmonary edema. __________________________________________  PROCEDURES  Procedure(s) performed: None Critical Care performed: None  ____________________________________________   ED COURSE / ASSESSMENT AND PLAN  CONSULTATIONS: None  Pertinent labs & imaging results that were available during my care of the patient were reviewed by me and considered in my medical decision making (see chart for details).   Patient was a fairly poor historian  for me, however she was a little bit more clear with the nurse about her recent past psychiatric history and dialysis history. The history she gave the nurse was consistent with a group home  staff describing to me that she has a dialysis appointment set up for this coming Tuesday, August 2. I am unsure what was giving her the symptoms of subjective shortness of breath overnight, however her blood work and examination are reassuring with stable vital signs, stable laboratory findings, and no clinical wheezing. Patient reports that she gets around only by wheelchair and is off oxygen and talking without any respiratory discomfort at all. I did go ahead and discharge her back to her group home. Although her history wasn't initially a little difficult to obtain, she is not reporting any depression or suicidal ideation and I think her psychiatric status is stable.  Patient / Family / Caregiver informed of clinical course, medical decision-making process, and agree with plan.   I discussed return precautions, follow-up instructions, and discharged instructions with patient and/or family.  ___________________________________________   FINAL CLINICAL IMPRESSION(S) / ED DIAGNOSES   Final diagnoses:  Shortness of breath    FOLLOW UP  Referred to: Dialysis on Tuesday as scheduled.   Governor Rooks, MD 05/09/15 1051

## 2015-05-09 NOTE — ED Notes (Signed)
Pt states she has been feeling sob since last night. She reports that her last dialysis treatment was Tuesday, although it may have been Thursday. She reports that she recently moved to [facility] and has been receiving her dialysis at the hospital because she has not had a dialysis center in 3 years. When asked why, she states "it is a long story." Pt alert & oriented.

## 2015-05-09 NOTE — ED Notes (Signed)
Pt states feels short of breath. Pt is supposed to have dialysis, but has not had dialysis since 05/05/2015. Pt states has not been able to get in to dialysis. Pt states "my feet are a little swollen." pt able to speak in full clear sentences.

## 2015-05-09 NOTE — ED Notes (Addendum)
Pt discharged home in taxi after verbalizing understanding of discharge instructions; nad noted. 

## 2015-05-09 NOTE — ED Notes (Signed)
Attempt venipuncture without success x1.

## 2015-05-10 DIAGNOSIS — I120 Hypertensive chronic kidney disease with stage 5 chronic kidney disease or end stage renal disease: Secondary | ICD-10-CM | POA: Diagnosis not present

## 2015-05-10 LAB — BASIC METABOLIC PANEL
ANION GAP: 15 (ref 5–15)
BUN: 51 mg/dL — ABNORMAL HIGH (ref 6–20)
CO2: 27 mmol/L (ref 22–32)
Calcium: 7.9 mg/dL — ABNORMAL LOW (ref 8.9–10.3)
Chloride: 96 mmol/L — ABNORMAL LOW (ref 101–111)
Creatinine, Ser: 11.94 mg/dL — ABNORMAL HIGH (ref 0.44–1.00)
GFR calc Af Amer: 3 mL/min — ABNORMAL LOW (ref >60)
GFR, EST NON AFRICAN AMERICAN: 3 mL/min — AB (ref >60)
GLUCOSE: 96 mg/dL (ref 65–99)
Potassium: 5.2 mmol/L — ABNORMAL HIGH (ref 3.5–5.1)
SODIUM: 138 mmol/L (ref 135–145)

## 2015-05-10 LAB — CBC WITH DIFFERENTIAL/PLATELET
BASOS ABS: 0.1 10*3/uL (ref 0–0.1)
BASOS PCT: 1 %
Eosinophils Absolute: 0.1 10*3/uL (ref 0–0.7)
Eosinophils Relative: 1 %
HCT: 24.7 % — ABNORMAL LOW (ref 35.0–47.0)
HEMOGLOBIN: 8.2 g/dL — AB (ref 12.0–16.0)
LYMPHS ABS: 2.1 10*3/uL (ref 1.0–3.6)
Lymphocytes Relative: 29 %
MCH: 35.2 pg — ABNORMAL HIGH (ref 26.0–34.0)
MCHC: 33.3 g/dL (ref 32.0–36.0)
MCV: 105.6 fL — ABNORMAL HIGH (ref 80.0–100.0)
Monocytes Absolute: 0.7 10*3/uL (ref 0.2–0.9)
Monocytes Relative: 10 %
NEUTROS ABS: 4.1 10*3/uL (ref 1.4–6.5)
NEUTROS PCT: 59 %
Platelets: 256 10*3/uL (ref 150–440)
RBC: 2.34 MIL/uL — ABNORMAL LOW (ref 3.80–5.20)
RDW: 14.4 % (ref 11.5–14.5)
WBC: 7.1 10*3/uL (ref 3.6–11.0)

## 2015-05-10 MED ORDER — LORAZEPAM 1 MG PO TABS
ORAL_TABLET | ORAL | Status: AC
  Filled 2015-05-10: qty 1

## 2015-05-10 MED ORDER — IBUPROFEN 600 MG PO TABS
ORAL_TABLET | ORAL | Status: AC
  Filled 2015-05-10: qty 1

## 2015-05-10 MED ORDER — IPRATROPIUM-ALBUTEROL 0.5-2.5 (3) MG/3ML IN SOLN
RESPIRATORY_TRACT | Status: AC
  Filled 2015-05-10: qty 3

## 2015-05-10 MED ORDER — IBUPROFEN 600 MG PO TABS
600.0000 mg | ORAL_TABLET | Freq: Once | ORAL | Status: AC
  Administered 2015-05-10: 600 mg via ORAL

## 2015-05-10 MED ORDER — LORAZEPAM 1 MG PO TABS
1.0000 mg | ORAL_TABLET | Freq: Once | ORAL | Status: AC
  Administered 2015-05-10: 1 mg via ORAL

## 2015-05-10 MED ORDER — IPRATROPIUM-ALBUTEROL 0.5-2.5 (3) MG/3ML IN SOLN
3.0000 mL | Freq: Once | RESPIRATORY_TRACT | Status: AC
  Administered 2015-05-10: 3 mL via RESPIRATORY_TRACT

## 2015-05-10 NOTE — ED Notes (Signed)
Pt continues to sleep. Oxygen continues to infuse at 2lpm via [place].

## 2015-05-10 NOTE — ED Notes (Signed)
Pt states "i can't breath" pt with clear breath sounds, able to yell and speak in full sentences without difficulty. Pox 98%, resp rate 18. md notified. Order for duoneb and ativan received. Pt very anxious. Pt states"you can't send me back there, i want to stay here." pt calms with reassurance.

## 2015-05-10 NOTE — ED Notes (Signed)
Confimed with [facility] that there is an appointment available for pt this afternoon at 1630.  Dr. Inocencio Homes is waiting to talk with nephrology to confirm treatment plan

## 2015-05-10 NOTE — ED Notes (Signed)
Marylene Land at [facility]  [phone]) was notified of plan for patient.   She states that they will transport pt to dialysis by 4 pm this afternoon as scheduled.  Also called [facility] on [address] [phone]) and talked with Mitch to confirm that pt is on the schedule for this afternoon and she will be arriving by 4 pm.

## 2015-05-10 NOTE — ED Notes (Signed)
Report to kerry, rn.  

## 2015-05-10 NOTE — ED Notes (Signed)
Call came in from retirement home because pt has not returned yet.  Pt is still out in the lobby waiting for her ride. She is still sitting in the wheelchair, talking on the phone in NAD, resp even and unlabored.  Offered to ask [employer] to pick patient up but pt stated that her daughter is "on the way right now."  [name] at the retirement home was noitified.

## 2015-05-10 NOTE — ED Notes (Signed)
Pt repositioned approximately every 10-15 minutes for comfort since arrival. Pt states 'i feel like i can't breathe when i'm layin' down." head of bed raised for comfort. Pt states immediate relief.

## 2015-05-10 NOTE — ED Notes (Signed)
Pt less anxious, pt placed on oxygen at 2lpm via [place] per pt request, "to help me breathe".

## 2015-05-10 NOTE — ED Notes (Signed)
Ann cales, rn in to attempt venipuncture without success. Lab notified.

## 2015-05-10 NOTE — ED Notes (Signed)
Pt states hip pain improved.

## 2015-05-10 NOTE — ED Notes (Signed)
Pt sleeping, call bell remains at side.

## 2015-05-10 NOTE — ED Notes (Signed)
Pt continues to sleep.

## 2015-05-10 NOTE — ED Notes (Signed)
Pt repositioned in bed for comfort. Pt updated on result progress. Pt verbalizes understanding.

## 2015-05-10 NOTE — ED Notes (Signed)
Lab here for venipuncture. Pt requesting pain medication. Dr. Derrill Kay notified. Pt states "my left hip hurts, if i'm gonna have to lay here, i need something for pain, they don't give me nothing by tylenol at that home place and that don't do nothing."

## 2015-05-10 NOTE — ED Provider Notes (Signed)
-----------------------------------------   8:42 AM on 05/10/2015 -----------------------------------------  Care assumed from Dr. Derrill Kay at 7:30 AM. Briefly this is a 64 year old female presenting with shortness of breath found to have mild pulmonary edema who will likely need dialysis today. Discussed with Dr. Luther Redo who is attempting to arrange dialysis today but needs to speak with an administrator who will not be here until later this morning. Patient is stable, in no acute distress.  ----------------------------------------- 10:28 AM on 05/10/2015 ----------------------------------------- Discussed with Dr. Luther Redo who has arranged for the patient to be Dialyzed today at 4 PM at [facility] on [facility]. The patient is not hypoxic, not tachypneic at time. She appears comfortable. I discussed the plan of care with her. She is comfortable with discharge and will follow up for dialysis later today. DC with return precautions.   Gayla Doss, MD 05/10/15 1030

## 2015-06-10 ENCOUNTER — Encounter: Payer: Self-pay | Admitting: Emergency Medicine

## 2016-07-10 IMAGING — DX DG CHEST 2V
2 series · 2 of 2 positions shown · non-contrast
Comparison: 08/10/2014 and earlier (duplicate [HOSPITAL] PACS account
to be merged)

CLINICAL DATA: 63-year-old female with shortness of breath.
abdominal pain, diarrhea, Initial encounter. Current history of
dialysis.

EXAM:
CHEST  2 VIEW

[chest pa]
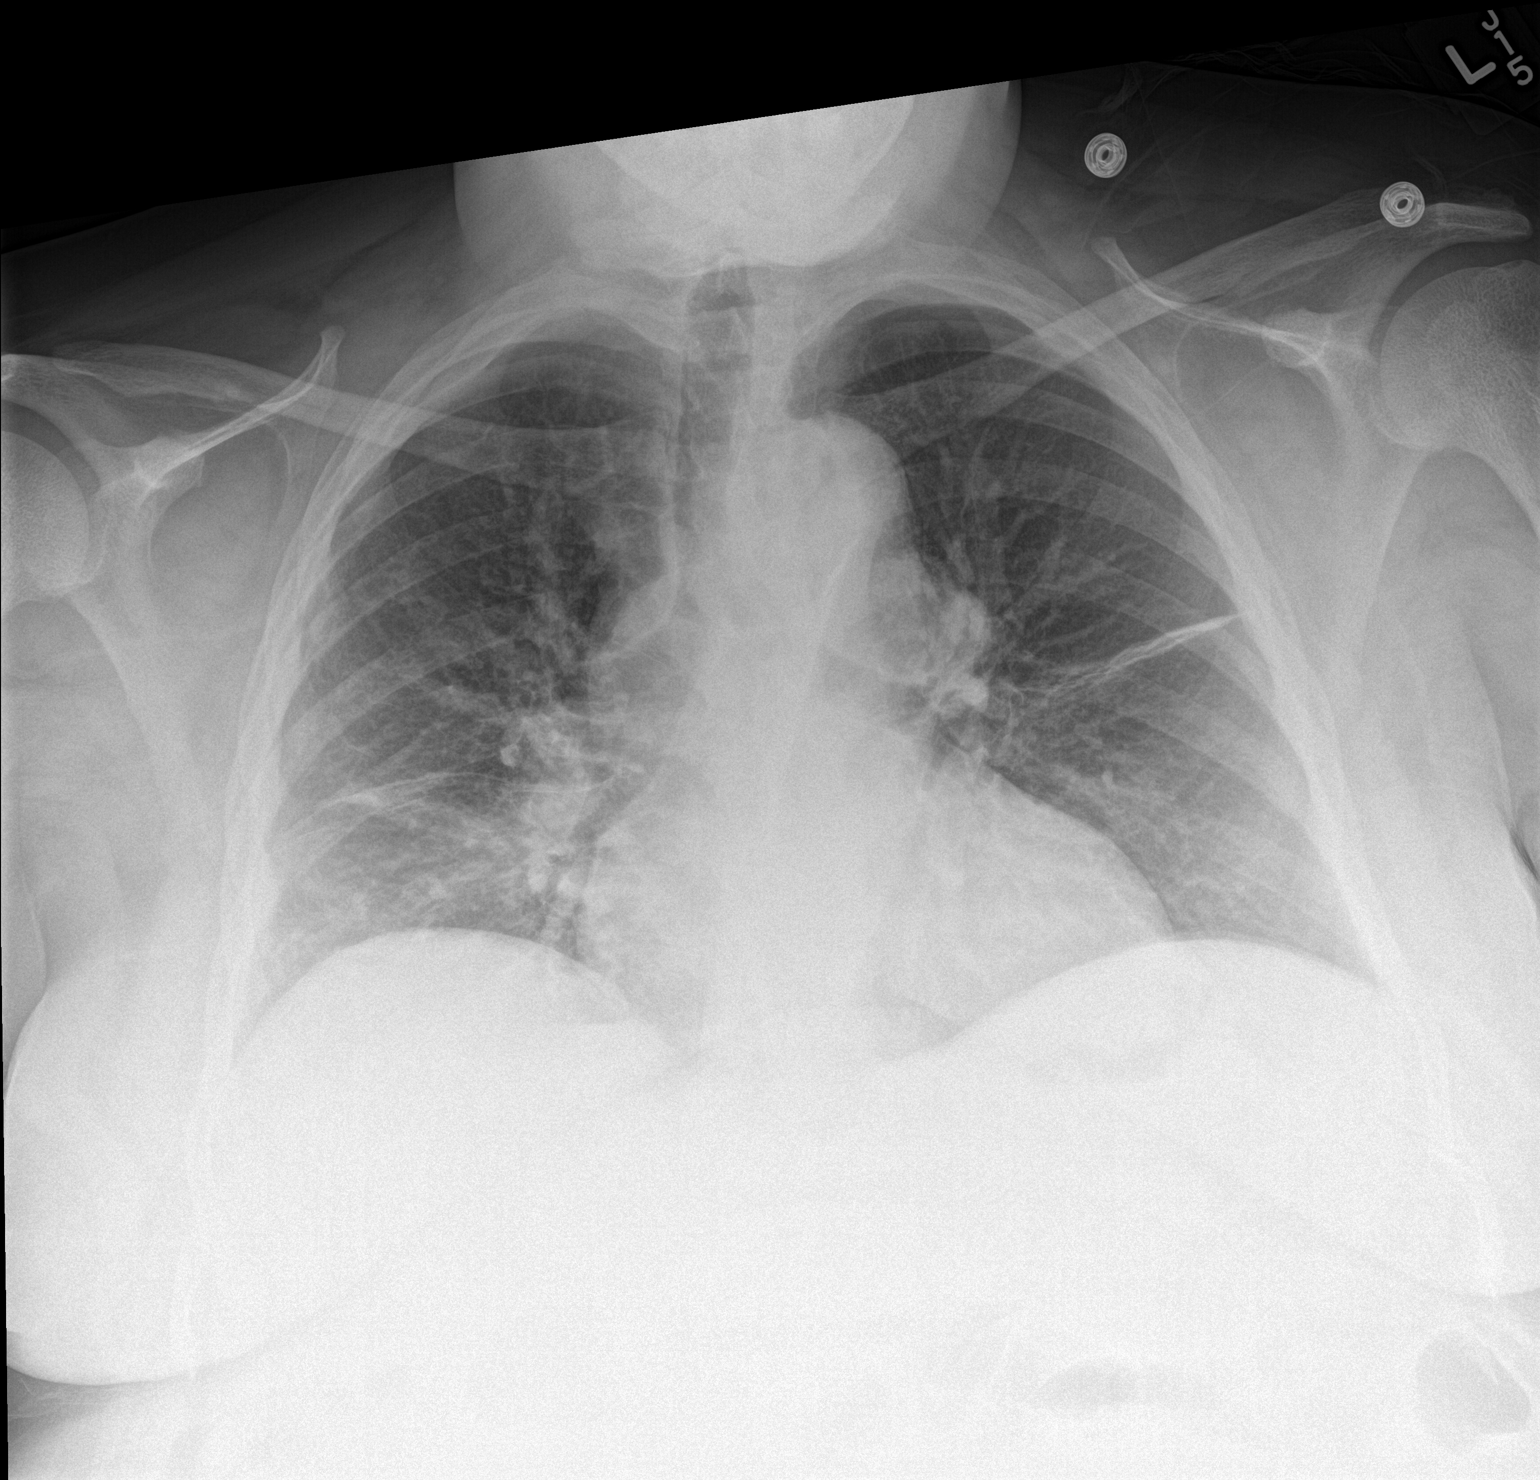

[chest lat]
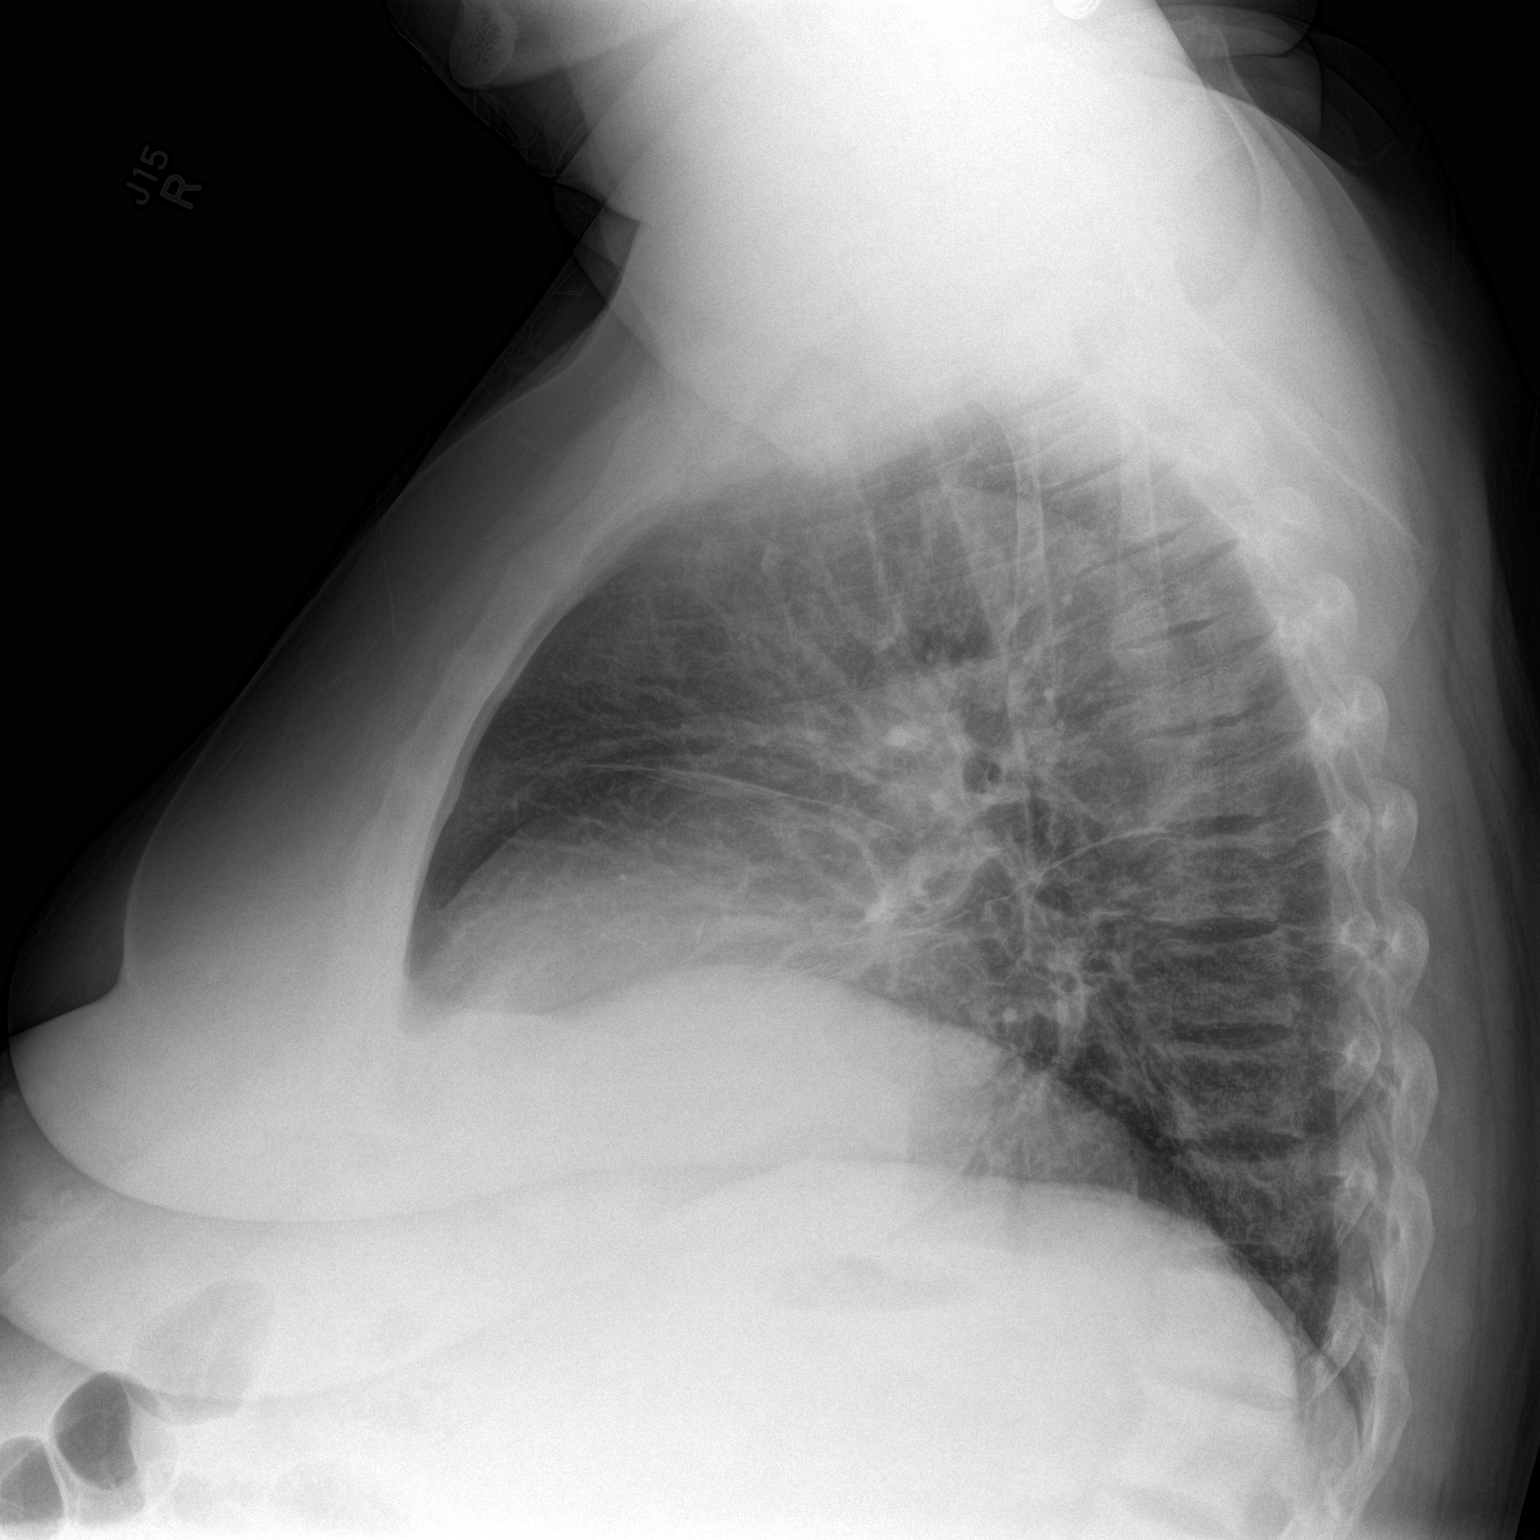

[2 of 2 positions shown; findings below may reference images not displayed]

FINDINGS: Lower lung volumes. Chronic curvilinear scarring or atelectasis in
the left mid lung. Mild atelectasis now along the right minor
fissure. Stable cardiac and mediastinal contours allowing for lung
volumes. No pneumothorax. No pleural effusion or consolidation. No
pneumoperitoneum. Increased pulmonary vascularity without overt
edema. Stable diffuse bony sclerosis, likely renal osteodystrophy.
IMPRESSION: Lower lung volumes with atelectasis. Pulmonary vascular congestion
without overt edema.

## 2016-07-20 IMAGING — CR DG FINGER RING 2+V*R*
3 series · 3 of 3 positions shown · non-contrast
Comparison: None.

CLINICAL DATA: Crush injury with wheelchair of the fourth digit

EXAM:
RIGHT RING FINGER 2+V

[x finger pa right]
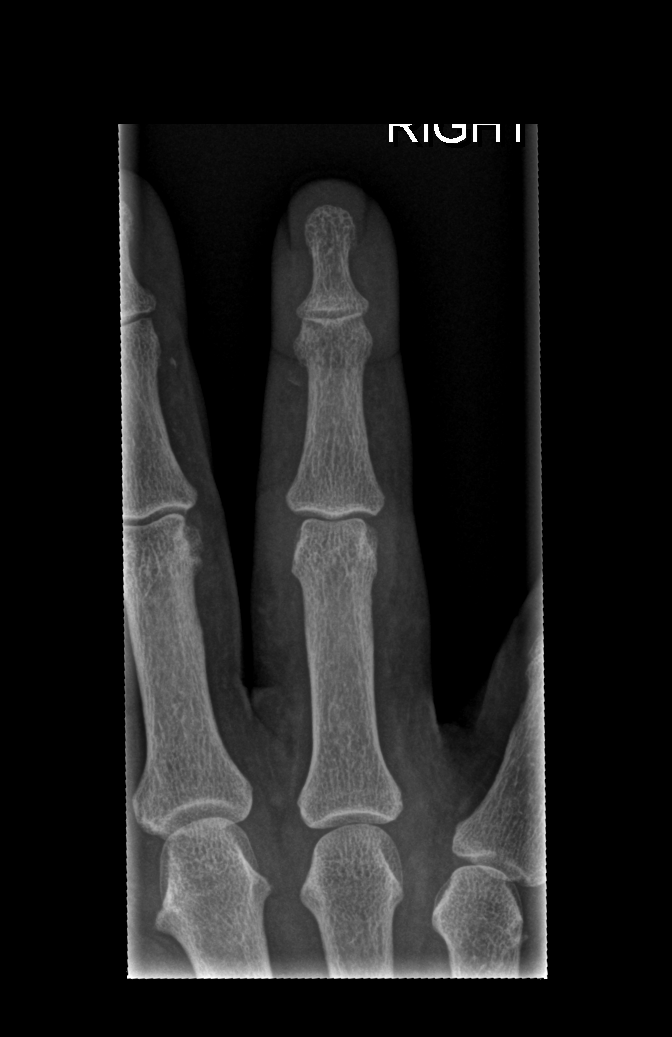

[x finger obl right]
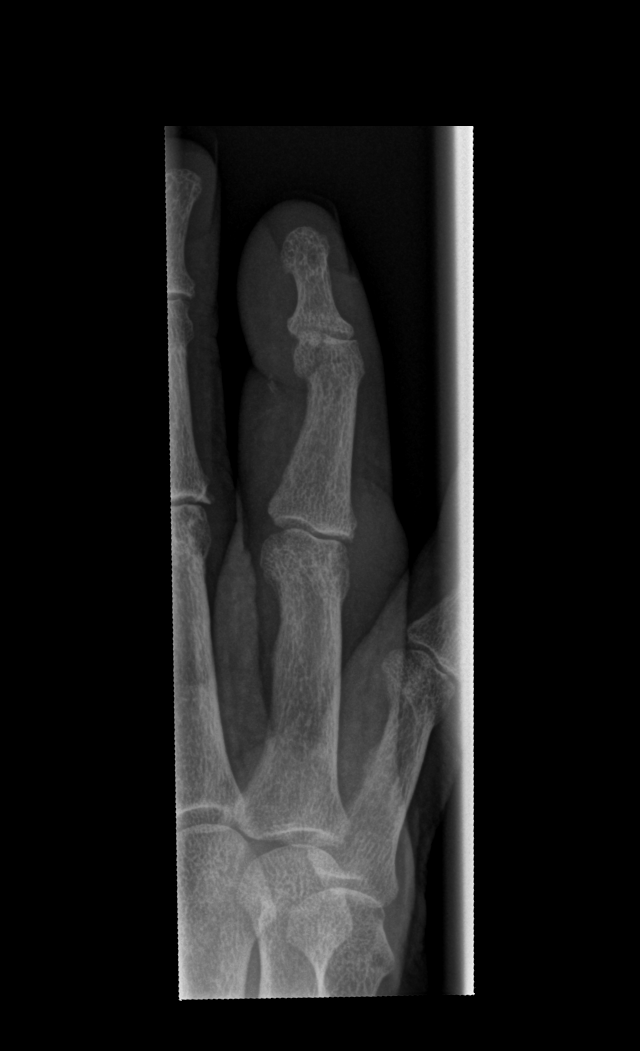

[x finger lat right]
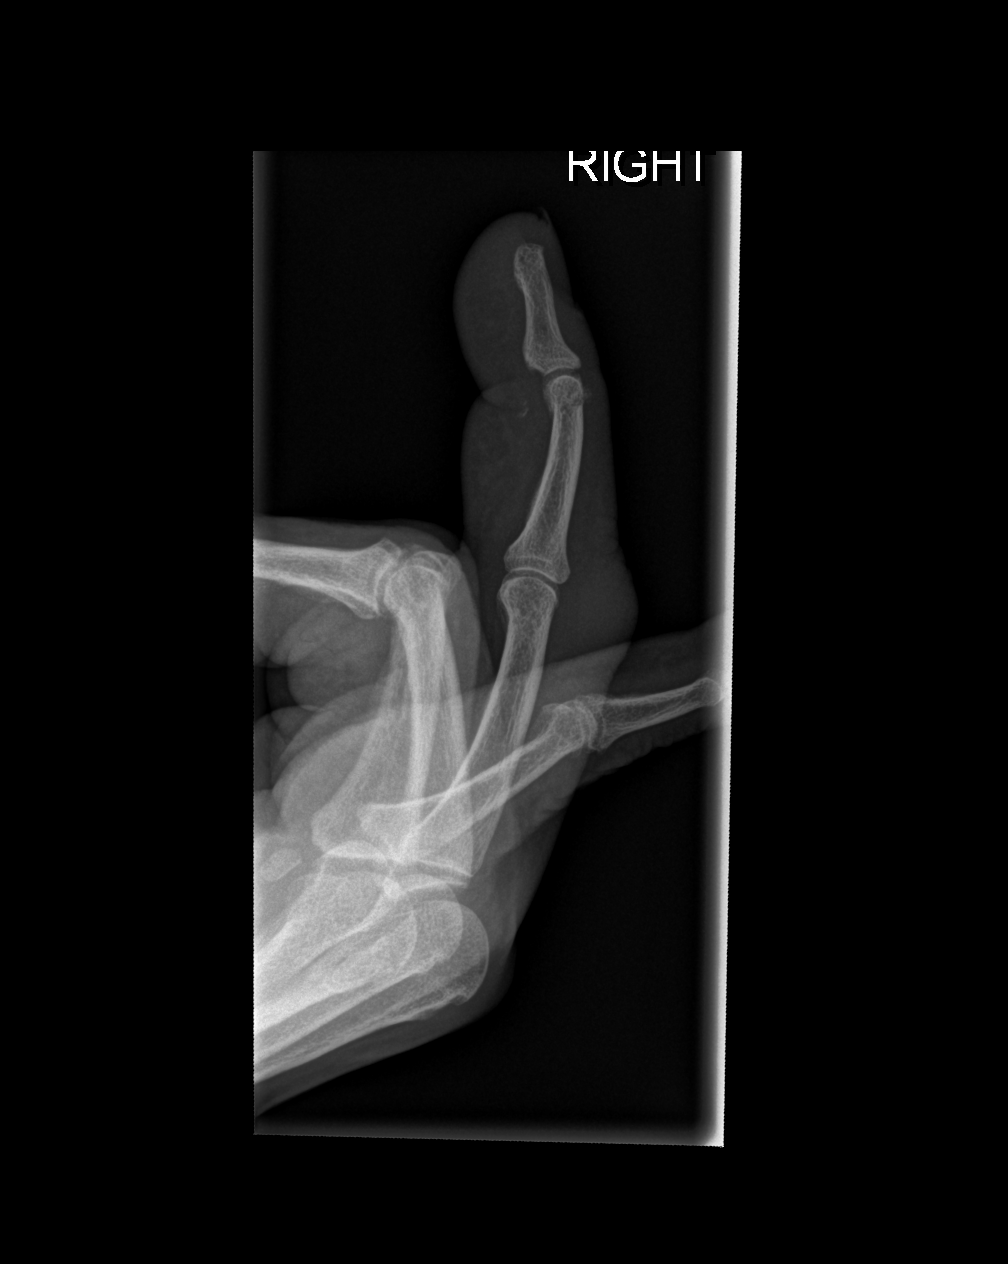

[3 of 3 positions shown; findings below may reference images not displayed]

FINDINGS: There is a nondisplaced fracture through the distal aspect of the
fourth middle phalanx. Soft tissue swelling is noted.
IMPRESSION: Fracture through the distal aspect of the fourth middle phalanx.

## 2016-09-14 IMAGING — CR DG CHEST 1V PORT
1 series · 1 of 1 positions shown · non-contrast
Comparison: 04/09/2015

CLINICAL DATA: Short of breath

EXAM:
PORTABLE CHEST - 1 VIEW

[AP]
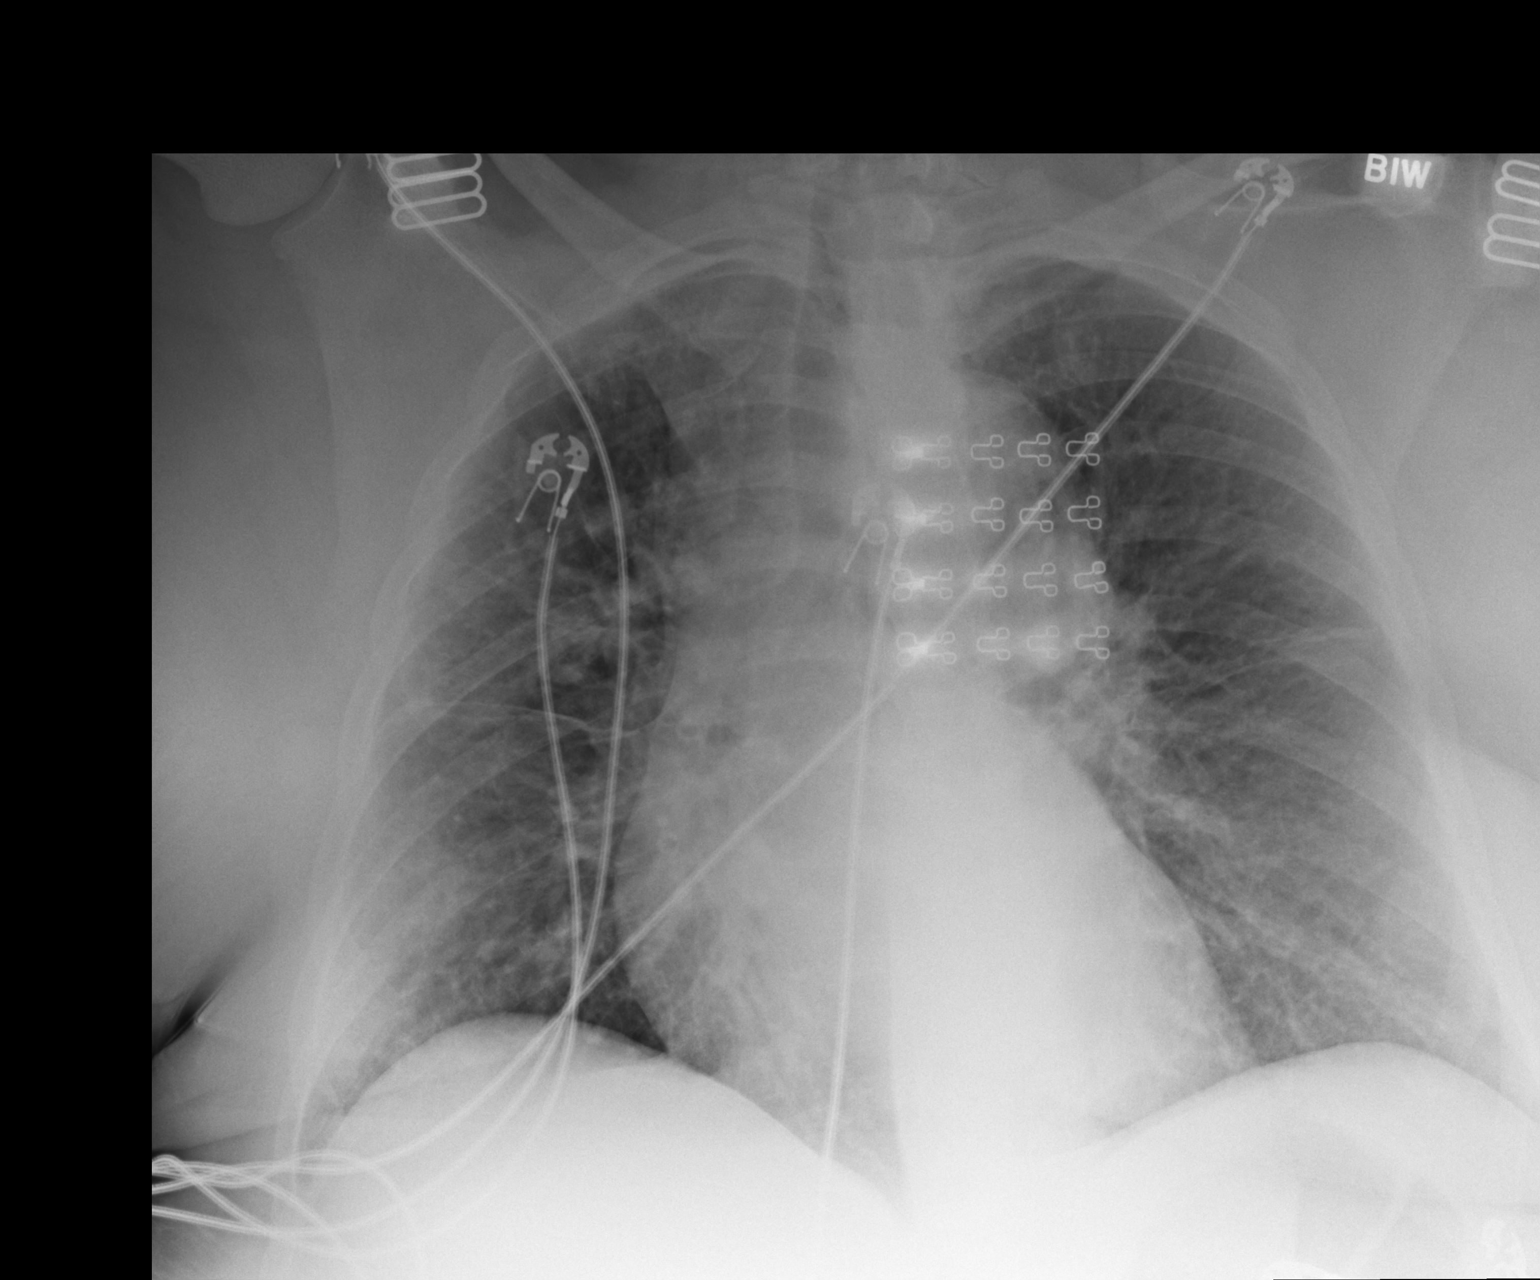

[1 of 1 positions shown; findings below may reference images not displayed]

FINDINGS: Cardiac enlargement. Mild vascular congestion without significant
edema or effusion. Possible mild fluid overload. No focal
consolidation.
IMPRESSION: Pulmonary vascular congestion likely secondary to fluid overload. No
significant edema.

## 2019-02-07 DEATH — deceased
